# Patient Record
Sex: Male | Born: 1975 | Race: White | Hispanic: No | Marital: Single | Smoking: Never smoker
Health system: Southern US, Community
[De-identification: ages and names within clinical notes are randomized; demographics above are authoritative.]

## PROBLEM LIST (undated history)

## (undated) DIAGNOSIS — G4730 Sleep apnea, unspecified: Secondary | ICD-10-CM

## (undated) DIAGNOSIS — I509 Heart failure, unspecified: Secondary | ICD-10-CM

## (undated) DIAGNOSIS — K631 Perforation of intestine (nontraumatic): Secondary | ICD-10-CM

## (undated) DIAGNOSIS — J90 Pleural effusion, not elsewhere classified: Secondary | ICD-10-CM

## (undated) DIAGNOSIS — Z8619 Personal history of other infectious and parasitic diseases: Secondary | ICD-10-CM

## (undated) DIAGNOSIS — R030 Elevated blood-pressure reading, without diagnosis of hypertension: Secondary | ICD-10-CM

## (undated) DIAGNOSIS — F32A Depression, unspecified: Secondary | ICD-10-CM

## (undated) DIAGNOSIS — F419 Anxiety disorder, unspecified: Secondary | ICD-10-CM

## (undated) DIAGNOSIS — A419 Sepsis, unspecified organism: Secondary | ICD-10-CM

## (undated) DIAGNOSIS — E669 Obesity, unspecified: Secondary | ICD-10-CM

## (undated) HISTORY — DX: Depression, unspecified: F32.A

## (undated) HISTORY — DX: Sleep apnea, unspecified: G47.30

## (undated) HISTORY — DX: Pleural effusion, not elsewhere classified: J90

## (undated) HISTORY — DX: Heart failure, unspecified: I50.9

## (undated) HISTORY — DX: Anxiety disorder, unspecified: F41.9

## (undated) HISTORY — DX: Elevated blood-pressure reading, without diagnosis of hypertension: R03.0

## (undated) HISTORY — DX: Personal history of other infectious and parasitic diseases: Z86.19

## (undated) HISTORY — PX: COLON SURGERY: SHX602

## (undated) HISTORY — DX: Obesity, unspecified: E66.9

## (undated) HISTORY — DX: Sepsis, unspecified organism: A41.9

---
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
## 2004-12-14 ENCOUNTER — Ambulatory Visit: Payer: Self-pay | Admitting: Family Medicine

## 2006-12-16 ENCOUNTER — Encounter: Payer: Self-pay | Admitting: Family Medicine

## 2011-04-06 ENCOUNTER — Encounter: Payer: Self-pay | Admitting: Family Medicine

## 2011-04-06 ENCOUNTER — Ambulatory Visit (INDEPENDENT_AMBULATORY_CARE_PROVIDER_SITE_OTHER): Payer: 59 | Admitting: Family Medicine

## 2011-04-06 DIAGNOSIS — R079 Chest pain, unspecified: Secondary | ICD-10-CM | POA: Insufficient documentation

## 2011-04-06 DIAGNOSIS — R030 Elevated blood-pressure reading, without diagnosis of hypertension: Secondary | ICD-10-CM | POA: Insufficient documentation

## 2011-04-06 DIAGNOSIS — E66813 Obesity, class 3: Secondary | ICD-10-CM | POA: Insufficient documentation

## 2011-04-06 DIAGNOSIS — E669 Obesity, unspecified: Secondary | ICD-10-CM

## 2011-04-06 NOTE — Patient Instructions (Signed)
Return at your convenience fasting for blood work, afterwards for a physical. Consider starting baby aspirin daily. We will recheck your blood pressure next visit (at physical). Work on starting walking at least 3 times daily for 20 -30 min Work on getting more water, more fruits/vegetables, less salt in diet. Good to meet you today, call us with questions. Please seek urgent care if you have any more chest discomfort episodes.

## 2011-04-06 NOTE — Progress Notes (Signed)
Subjective:    Patient ID: Christian [name], male    DOB: 03/25/1975, 36 y.o.   MRN: [id number]  HPI CC: new pt establish  Prior saw Dr. Milinda Antis.  Presents to re establish.  Works at [employer] part time.  Seen at [facility] recently for cold, told had elevated bp reading.  Thinks 150sbp.  No HA, vision changes, SOB, leg swelling.  Sometimes feels chest tightness in cold weather or after exhertion (ie carrying customer's groceries).  When gets discomfort, lasts 1 hour.  Last time had this was last week.  No heartburn sxs.  No arm pain or nausea/diaphoresis.  Not relieved by rest.  No recent labwork or physical.    Father with MI last year.  Father obese.  Preventative: No recent CPE.  Medications and allergies reviewed and updated in chart.  Past histories reviewed and updated if relevant as below. Patient Active Problem List  Diagnoses  . Obesity  . Elevated blood pressure reading without diagnosis of hypertension   Past Medical History  Diagnosis Date  . History of chicken pox   . Allergic rhinitis   . Elevated blood pressure reading without diagnosis of hypertension   . Obesity    Past Surgical History  Procedure Date  . Tonsillectomy and adenoidectomy 1985   History  Substance Use Topics  . Smoking status: Never Smoker   . Smokeless tobacco: Never Used  . Alcohol Use: No   Family History  Problem Relation Age of Onset  . Coronary artery disease Father 12    MI  . Hypertension Father   . Hyperlipidemia Father   . Diabetes Father   . Heart disease Mother     CHF, pacer  . Cancer Mother     lymphoma  . Stroke Paternal Grandmother   . Diabetes Paternal Grandmother    No Known Allergies No current outpatient prescriptions on file prior to visit.   Review of Systems  Constitutional: Negative for fever, chills, activity change, appetite change, fatigue and unexpected weight change.  HENT: Negative for hearing loss and neck pain.   Eyes: Negative for visual  disturbance.  Respiratory: Positive for chest tightness (see HPI). Negative for cough, shortness of breath and wheezing.   Cardiovascular: Negative for chest pain, palpitations and leg swelling.  Gastrointestinal: Negative for nausea, vomiting, abdominal pain, diarrhea, constipation, blood in stool and abdominal distention.  Genitourinary: Negative for hematuria and difficulty urinating.  Musculoskeletal: Negative for myalgias and arthralgias.  Skin: Negative for rash.  Neurological: Negative for dizziness, seizures, syncope and headaches.  Hematological: Does not bruise/bleed easily.  Psychiatric/Behavioral: Negative for dysphoric mood. The patient is not nervous/anxious.        Objective:   Physical Exam  Nursing note and vitals reviewed. Constitutional: He is oriented to person, place, and time. He appears well-developed and well-nourished. No distress.       obese  HENT:  Head: Normocephalic and atraumatic.  Right Ear: Hearing, tympanic membrane, external ear and ear canal normal.  Left Ear: Hearing, tympanic membrane, external ear and ear canal normal.  Nose: Nose normal. No mucosal edema or rhinorrhea.  Mouth/Throat: Uvula is midline, oropharynx is clear and moist and mucous membranes are normal. No oropharyngeal exudate, posterior oropharyngeal edema, posterior oropharyngeal erythema or tonsillar abscesses.  Eyes: Conjunctivae and EOM are normal. Pupils are equal, round, and reactive to light. No scleral icterus.  Neck: Normal [name] of motion. Neck supple. No thyromegaly present.  Cardiovascular: Normal rate, regular rhythm, normal heart sounds  and intact distal pulses.   No murmur heard. Pulses:      Radial pulses are 2+ on the right side, and 2+ on the left side.  Pulmonary/Chest: Effort normal and breath sounds normal. No respiratory distress. He has no wheezes. He has no rales.  Abdominal: Soft. Bowel sounds are normal. He exhibits no distension and no mass. There is no  tenderness. There is no rebound and no guarding.  Musculoskeletal: Normal [name] of motion. He exhibits no edema.  Lymphadenopathy:    He has no cervical adenopathy.  Neurological: He is alert and oriented to person, place, and time.       CN grossly intact, station and gait intact  Skin: Skin is warm and dry. No rash noted.  Psychiatric: He has a normal mood and affect. His behavior is normal. Judgment and thought content normal.      Assessment & Plan:

## 2011-04-06 NOTE — Assessment & Plan Note (Signed)
Encouraged start regular exercise routine, suggested daily or 3x/wk walking 20-30 min around house.  Thinks would be able to do this.  Has discussed with mother joining Y. Briefly discussed healthy eating.

## 2011-04-06 NOTE — Assessment & Plan Note (Signed)
Return fasting for blood work. EKG today. See below. Will recheck at physical in 1 mo, if remaining elevated, will start BP med.

## 2011-04-06 NOTE — Assessment & Plan Note (Addendum)
Atypical chest pain, young male.  No early fmhx CAD although + fm hx in father with recent MI. Risk factors include obesity and mildly elevated BP. rec start baby aspirin, return 1 mo for recheck BP. No current chest pain. Knows if return of chest pain to seek urgent evaluation. baseline EKG today - NSR at 89, 1 PAC, normal axis, intervals, no hypertrophy or acute ST/T changes.  no q waves.

## 2011-04-10 ENCOUNTER — Encounter: Payer: Self-pay | Admitting: Family Medicine

## 2011-05-02 ENCOUNTER — Other Ambulatory Visit: Payer: 59

## 2011-05-05 ENCOUNTER — Encounter: Payer: 59 | Admitting: Family Medicine

## 2016-05-22 ENCOUNTER — Emergency Department: Payer: Managed Care, Other (non HMO)

## 2016-05-22 ENCOUNTER — Emergency Department
Admission: EM | Admit: 2016-05-22 | Discharge: 2016-05-22 | Disposition: A | Discharge status: Home / Self Care | Payer: Managed Care, Other (non HMO) | Attending: Emergency Medicine | Admitting: Emergency Medicine

## 2016-05-22 ENCOUNTER — Encounter: Payer: Self-pay | Admitting: Emergency Medicine

## 2016-05-22 DIAGNOSIS — R0902 Hypoxemia: Secondary | ICD-10-CM | POA: Diagnosis present

## 2016-05-22 DIAGNOSIS — R0602 Shortness of breath: Secondary | ICD-10-CM | POA: Insufficient documentation

## 2016-05-22 DIAGNOSIS — J101 Influenza due to other identified influenza virus with other respiratory manifestations: Secondary | ICD-10-CM

## 2016-05-22 DIAGNOSIS — Z79899 Other long term (current) drug therapy: Secondary | ICD-10-CM | POA: Diagnosis not present

## 2016-05-22 LAB — LACTIC ACID, PLASMA: Lactic Acid, Venous: 0.9 mmol/L (ref 0.5–1.9)

## 2016-05-22 LAB — TROPONIN I

## 2016-05-22 LAB — COMPREHENSIVE METABOLIC PANEL
ALT: 15 U/L — AB (ref 17–63)
AST: 24 U/L (ref 15–41)
Albumin: 3.9 g/dL (ref 3.5–5.0)
Alkaline Phosphatase: 68 U/L (ref 38–126)
Anion gap: 7 (ref 5–15)
BILIRUBIN TOTAL: 0.9 mg/dL (ref 0.3–1.2)
BUN: 17 mg/dL (ref 6–20)
CHLORIDE: 103 mmol/L (ref 101–111)
CO2: 24 mmol/L (ref 22–32)
CREATININE: 1.44 mg/dL — AB (ref 0.61–1.24)
Calcium: 8.7 mg/dL — ABNORMAL LOW (ref 8.9–10.3)
GFR, EST NON AFRICAN AMERICAN: 59 mL/min — AB (ref >60)
Glucose, Bld: 133 mg/dL — ABNORMAL HIGH (ref 65–99)
POTASSIUM: 3.5 mmol/L (ref 3.5–5.1)
SODIUM: 134 mmol/L — AB (ref 135–145)
TOTAL PROTEIN: 7.5 g/dL (ref 6.5–8.1)

## 2016-05-22 LAB — CBC WITH DIFFERENTIAL/PLATELET
BASOS ABS: 0 10*3/uL (ref 0–0.1)
BASOS PCT: 1 %
EOS ABS: 0 10*3/uL (ref 0–0.7)
Eosinophils Relative: 0 %
HCT: 44.7 % (ref 40.0–52.0)
Hemoglobin: 15.2 g/dL (ref 13.0–18.0)
LYMPHS ABS: 0.8 10*3/uL — AB (ref 1.0–3.6)
LYMPHS PCT: 9 %
MCH: 27.3 pg (ref 26.0–34.0)
MCHC: 33.9 g/dL (ref 32.0–36.0)
MCV: 80.6 fL (ref 80.0–100.0)
Monocytes Absolute: 1.7 10*3/uL — ABNORMAL HIGH (ref 0.2–1.0)
Monocytes Relative: 20 %
NEUTROS ABS: 6 10*3/uL (ref 1.4–6.5)
NEUTROS PCT: 70 %
PLATELETS: 271 10*3/uL (ref 150–440)
RBC: 5.55 MIL/uL (ref 4.40–5.90)
RDW: 13.9 % (ref 11.5–14.5)
WBC: 8.6 10*3/uL (ref 3.8–10.6)

## 2016-05-22 LAB — MAGNESIUM: MAGNESIUM: 1.9 mg/dL (ref 1.7–2.4)

## 2016-05-22 LAB — BRAIN NATRIURETIC PEPTIDE: B Natriuretic Peptide: 71 pg/mL (ref 0.0–100.0)

## 2016-05-22 MED ORDER — SODIUM CHLORIDE 0.9 % IV BOLUS (SEPSIS)
1000.0000 mL | INTRAVENOUS | Status: AC
  Administered 2016-05-22: 1000 mL via INTRAVENOUS

## 2016-05-22 MED ORDER — OSELTAMIVIR PHOSPHATE 75 MG PO CAPS
75.0000 mg | ORAL_CAPSULE | Freq: Once | ORAL | Status: AC
  Administered 2016-05-22: 75 mg via ORAL
  Filled 2016-05-22: qty 1

## 2016-05-22 MED ORDER — OSELTAMIVIR PHOSPHATE 75 MG PO CAPS: 75.0000 mg | ORAL_CAPSULE | Freq: Two times a day (BID) | ORAL | 0 refills | Status: AC

## 2016-05-22 NOTE — Discharge Instructions (Signed)
You were diagnosed with the flu (influenza).  You will feel ill for as much as a few weeks.  Please take any prescribed medications as instructed, and you may use over-the-counter Tylenol and/or ibuprofen as needed according to label instructions (unless you have an allergy to either or have been told by your doctor not to take them).  Follow up with your physician as instructed above, and return to the Emergency Department (ED) if you are unable to tolerate fluids due to vomiting, have worsening trouble breathing, become extremely tired or difficult to awaken, or if you develop any other symptoms that concern you.  

## 2016-05-22 NOTE — ED Provider Notes (Signed)
[facility] Emergency Department Provider Note  ____________________________________________   First MD Initiated Contact with Patient 05/22/16 1617     (approximate)  I have reviewed the triage vital signs and the nursing notes.   HISTORY  Chief Complaint hypoxemia (low SPO2)    HPI Christian [name] is a 41 y.o. male 's medical history is significant only for morbid obesity who presents for evaluation of low oxygen saturation in setting of several days of viral symptoms.  [facility] reporting that he has had gradual dry cough, mild headache and some generalized malaise, increased fatigue, and some body aches.  He thinks he has had a fever and a very mild sore throat.  Denies any shortness of breath, chest pain, nausea, vomiting, abdominal pain, orthopnea, congestion, sneezing, ear pain.  Nothing particular makes his symptoms better nor worse.  He reports that at least one of his coworkers has had similar symptoms recently.  At [facility] his evaluation was generally reassuring except for questionable decreased breath sounds throughout, but he had a very mild tachycardia and an SPO2 of 88% on room air so EMS was called.  This reports that his oxygenation was 95% on room air which is consistent with what we found in the emergency department.   Past Medical History:  Diagnosis Date  . Allergic rhinitis   . Elevated blood pressure reading without diagnosis of hypertension   . History of chicken pox   . Obesity     Patient Active Problem List   Diagnosis Date Noted  . Chest pain 04/06/2011  . Obesity   . Elevated blood pressure reading without diagnosis of hypertension     Past Surgical History:  Procedure Laterality Date  . TONSILLECTOMY AND ADENOIDECTOMY  1985    Prior to Admission medications   Medication Sig Start Date End Date Taking? Authorizing Provider  albuterol (PROVENTIL HFA;VENTOLIN HFA) 108 (90 Base) MCG/ACT  inhaler Inhale 1-2 puffs into the lungs every 4 (four) hours as needed. 01/26/16 01/25/17 Yes Historical Provider, MD  cetirizine (ZYRTEC) 10 MG tablet Take 10 mg by mouth daily.   Yes Historical Provider, MD  Cholecalciferol ([employer] D3) 5000 units capsule Take 20,000 Units by mouth daily.   Yes Historical Provider, MD  Menaquinone-7 (VITAMIN K2) 100 MCG CAPS Take 200 capsules by mouth daily.   Yes Historical Provider, MD  Multiple Vitamin (MULTIVITAMIN) capsule Take 1 capsule by mouth daily.   Yes Historical Provider, MD  oseltamivir (TAMIFLU) 75 MG capsule Take 1 capsule (75 mg total) by mouth 2 (two) times daily. 05/22/16 05/27/16  Loleta Rose, MD    Allergies Patient has no known allergies.  Family History  Problem Relation Age of Onset  . Coronary artery disease Father 87    MI  . Hypertension Father   . Hyperlipidemia Father   . Diabetes Father   . Heart disease Mother     CHF, pacer  . Cancer Mother     lymphoma  . Stroke Paternal Grandmother   . Diabetes Paternal Grandmother     Social History Social History  Substance Use Topics  . Smoking status: Never Smoker  . Smokeless tobacco: Never Used  . Alcohol use No    Review of Systems Constitutional: Subjective fever, no chills.  Increased fatigue and +myalgia Eyes: No visual changes. ENT: Mild sore throat. Cardiovascular: Denies chest pain. Respiratory: Dry cough, no shortness of breath, no increased work of breathing Gastrointestinal: No abdominal pain.  No nausea, no vomiting.  No diarrhea.  No constipation. Genitourinary: Negative for dysuria. Musculoskeletal: Negative for back pain. Skin: Negative for rash. Neurological: Negative for headaches, focal weakness or numbness.  10-point ROS otherwise negative.  ____________________________________________   PHYSICAL EXAM:  VITAL SIGNS: ED Triage Vitals [05/22/16 1605]  Enc Vitals Group     BP 103/66     Pulse Rate (!) 110     Resp      Temp 99.3 F (37.4  C)     Temp Source Oral     SpO2 95 %     Weight (!) 400 lb (181.4 kg)     Height 6\' 3"  (1.905 m)     Head Circumference      Peak Flow      Pain Score      Pain Loc      Pain Edu?      Excl. in GC?     Constitutional: Alert and oriented. No acute distress Eyes: Conjunctivae are normal. PERRL. EOMI. Head: Atraumatic. Nose: No congestion/rhinnorhea. Mouth/Throat: Mucous membranes are active.  The Neck: No stridor.  No meningeal signs.   Cardiovascular: Mild tachycardia, regular rhythm. Good peripheral circulation. Grossly normal heart sounds. Respiratory: Normal respiratory effort.  No retractions. Lungs CTAB.  Lung sounds may be decreased from baseline but I think they are likely decreased due to body habitus. Gastrointestinal: Morbid obesity.  Soft and nontender. No distention.  Musculoskeletal: No lower extremity tenderness nor edema. No gross deformities of extremities. Neurologic:  Normal speech and language. No gross focal neurologic deficits are appreciated.  Skin:  Skin is warm, dry and intact. No rash noted. Psychiatric: Mood and affect are normal. Speech and behavior are normal.  ____________________________________________   LABS (all labs ordered are listed, but only abnormal results are displayed)  Labs Reviewed  CBC WITH DIFFERENTIAL/PLATELET - Abnormal; Notable for the following:       Result Value   Lymphs Abs 0.8 (*)    Monocytes Absolute 1.7 (*)    All other components within normal limits  COMPREHENSIVE METABOLIC PANEL - Abnormal; Notable for the following:    Sodium 134 (*)    Glucose, Bld 133 (*)    Creatinine, Ser 1.44 (*)    Calcium 8.7 (*)    ALT 15 (*)    GFR calc non Af Amer 59 (*)    All other components within normal limits  TROPONIN I  MAGNESIUM  BRAIN NATRIURETIC PEPTIDE  LACTIC ACID, PLASMA   ____________________________________________  EKG  ED ECG REPORT I, Harman Langhans, the attending physician, personally viewed and  interpreted this ECG.  Date: 05/22/2016 EKG Time: 16:07 Rate: 110 Rhythm: Sinus tachycardia QRS Axis: normal Intervals: normal ST/T Wave abnormalities: normal Conduction Disturbances: none Narrative Interpretation: unremarkable  ____________________________________________  RADIOLOGY   Dg Chest 2 View  Result Date: 05/22/2016 CLINICAL DATA:  Shortness of breath, cough, fever. EXAM: CHEST  2 VIEW COMPARISON:  None. FINDINGS: The heart size and mediastinal contours are within normal limits. Both lungs are clear. No pneumothorax or pleural effusion is noted. The visualized skeletal structures are unremarkable. IMPRESSION: No active cardiopulmonary disease. Electronically Signed   By: Lupita RaiderJames  Green Jr, M.D.   On: 05/22/2016 17:14    ____________________________________________   PROCEDURES  Critical Care performed: No   Procedure(s) performed:   Procedures   ____________________________________________   INITIAL IMPRESSION / ASSESSMENT AND PLAN / ED COURSE  Pertinent labs & imaging results that were available during my care of  the patient were reviewed by me and considered in my medical decision making (see chart for details).  I obtained written authorization from the patient according to the form provided through care everywhere to obtain electronic access to [facility].  I reviewed the clinic note including his reassuring exam and the fact that the point of care test for influenza was positive for influenza A and negative for influenza B.  All of his vital signs were normal except for a mild tachycardia and a recorded room air oxygen saturation of 88%.  This is in contrast to the consistent SPO2 readings that EMS and our department has measured of 95% on room air.  He is in no distress and not using accessory muscles.  Given the reports of hypoxemia which is likely compounded by his body habitus, that he is influenza A positive, I will evaluate him further  for possible pneumonia to determine an appropriate disposition.   Clinical Course as of May 23 1923  Wynelle Link May 22, 2016  1924 The patient has had no episodes of hypoxemia since coming to the emergency department.  After fluid resuscitation and his heart rate is coming down.  He is not short of breath and agrees with the plan for outpatient follow-up.  There is no benefit to admission for him as there is nothing they can do in the hospital to cure him of his influenza, and he is not oxygen dependent.  I had my usual discussion regarding the questionable efficacy of Tamiflu but given that he has symptoms for 2 days and it may help him with the duration of symptoms in spite of the side effects of diarrhea, etc., I have given him a prescription.  I encouraged him to follow up as soon as possible and I gave strict return precautions if his breathing status worsens.  He understands and agrees with the plan.  [CF]    Clinical Course User Index [CF] Loleta Rose, MD    ____________________________________________  FINAL CLINICAL IMPRESSION(S) / ED DIAGNOSES  Final diagnoses:  Influenza A     MEDICATIONS GIVEN DURING THIS VISIT:  Medications  sodium chloride 0.9 % bolus 1,000 mL (0 mLs Intravenous Stopped 05/22/16 1842)  sodium chloride 0.9 % bolus 1,000 mL (1,000 mLs Intravenous New Bag/Given 05/22/16 1841)  oseltamivir (TAMIFLU) capsule 75 mg (75 mg Oral Given 05/22/16 1906)     NEW OUTPATIENT MEDICATIONS STARTED DURING THIS VISIT:  New Prescriptions   OSELTAMIVIR (TAMIFLU) 75 MG CAPSULE    Take 1 capsule (75 mg total) by mouth 2 (two) times daily.    Modified Medications   No medications on file    Discontinued Medications   No medications on file     Note:  This document was prepared using Dragon voice recognition software and may include unintentional dictation errors.    Loleta Rose, MD 05/22/16 [phone]

## 2016-05-22 NOTE — ED Triage Notes (Addendum)
[facility] c/o low SPO2 there. Pt 95% room air on arrival without SOB, or dyspnea. Pt tested positive for the flu at [facility]

## 2019-09-23 ENCOUNTER — Other Ambulatory Visit: Payer: Self-pay

## 2019-09-23 ENCOUNTER — Emergency Department: Payer: 59

## 2019-09-23 ENCOUNTER — Encounter: Payer: Self-pay | Admitting: Emergency Medicine

## 2019-09-23 ENCOUNTER — Emergency Department
Admission: EM | Admit: 2019-09-23 | Discharge: 2019-09-24 | Disposition: A | Discharge status: Home / Self Care | Payer: 59 | Attending: Emergency Medicine | Admitting: Emergency Medicine

## 2019-09-23 DIAGNOSIS — Z7951 Long term (current) use of inhaled steroids: Secondary | ICD-10-CM | POA: Insufficient documentation

## 2019-09-23 DIAGNOSIS — J40 Bronchitis, not specified as acute or chronic: Secondary | ICD-10-CM | POA: Insufficient documentation

## 2019-09-23 DIAGNOSIS — R05 Cough: Secondary | ICD-10-CM | POA: Insufficient documentation

## 2019-09-23 DIAGNOSIS — Z79899 Other long term (current) drug therapy: Secondary | ICD-10-CM | POA: Insufficient documentation

## 2019-09-23 DIAGNOSIS — I10 Essential (primary) hypertension: Secondary | ICD-10-CM | POA: Insufficient documentation

## 2019-09-23 DIAGNOSIS — R6883 Chills (without fever): Secondary | ICD-10-CM | POA: Insufficient documentation

## 2019-09-23 DIAGNOSIS — Z20822 Contact with and (suspected) exposure to covid-19: Secondary | ICD-10-CM | POA: Insufficient documentation

## 2019-09-23 DIAGNOSIS — M791 Myalgia, unspecified site: Secondary | ICD-10-CM | POA: Insufficient documentation

## 2019-09-23 LAB — COMPREHENSIVE METABOLIC PANEL
ALT: 16 U/L (ref 0–44)
AST: 16 U/L (ref 15–41)
Albumin: 4.1 g/dL (ref 3.5–5.0)
Alkaline Phosphatase: 71 U/L (ref 38–126)
Anion gap: 11 (ref 5–15)
BUN: 15 mg/dL (ref 6–20)
CO2: 24 mmol/L (ref 22–32)
Calcium: 8.9 mg/dL (ref 8.9–10.3)
Chloride: 102 mmol/L (ref 98–111)
Creatinine, Ser: 1.26 mg/dL — ABNORMAL HIGH (ref 0.61–1.24)
GFR calc Af Amer: >60 mL/min (ref >60)
GFR calc non Af Amer: >60 mL/min (ref >60)
Glucose, Bld: 127 mg/dL — ABNORMAL HIGH (ref 70–99)
Potassium: 3.8 mmol/L (ref 3.5–5.1)
Sodium: 137 mmol/L (ref 135–145)
Total Bilirubin: 1.5 mg/dL — ABNORMAL HIGH (ref 0.3–1.2)
Total Protein: 7.8 g/dL (ref 6.5–8.1)

## 2019-09-23 LAB — CBC
HCT: 45.6 % (ref 39.0–52.0)
Hemoglobin: 14.8 g/dL (ref 13.0–17.0)
MCH: 27 pg (ref 26.0–34.0)
MCHC: 32.5 g/dL (ref 30.0–36.0)
MCV: 83.1 fL (ref 80.0–100.0)
Platelets: 309 10*3/uL (ref 150–400)
RBC: 5.49 MIL/uL (ref 4.22–5.81)
RDW: 13.2 % (ref 11.5–15.5)
WBC: 10.2 10*3/uL (ref 4.0–10.5)
nRBC: 0 % (ref 0.0–0.2)

## 2019-09-23 MED ORDER — IPRATROPIUM-ALBUTEROL 0.5-2.5 (3) MG/3ML IN SOLN
3.0000 mL | Freq: Once | RESPIRATORY_TRACT | Status: AC
  Administered 2019-09-24: 3 mL via RESPIRATORY_TRACT
  Filled 2019-09-23: qty 3

## 2019-09-23 MED ORDER — PREDNISONE 20 MG PO TABS
60.0000 mg | ORAL_TABLET | Freq: Once | ORAL | Status: AC
  Administered 2019-09-24: 60 mg via ORAL
  Filled 2019-09-23: qty 3

## 2019-09-23 NOTE — ED Provider Notes (Signed)
[facility] Emergency Department Provider Note   ____________________________________________   First MD Initiated Contact with Patient 09/23/19 2354     (approximate)  I have reviewed the triage vital signs and the nursing notes.   HISTORY  Chief Complaint Shortness of Breath, Cough, Chills, and Generalized Body Aches    HPI Christian [name] is a 44 y.o. male referred from the urgent care to the ED for evaluation of hypertension and hypoxia.  Patient went to the urgent care for work note because he has been off of work the last 2 days due to cold-like symptoms.  Patient complaining of chills, productive cough, shortness of breath and body aches.  Denies known COVID-19 exposure.  Has not had his COVID-19 vaccinations.  Denies chest pain, abdominal pain, nausea, vomiting or diarrhea.       Past Medical History:  Diagnosis Date  . Allergic rhinitis   . Elevated blood pressure reading without diagnosis of hypertension   . History of chicken pox   . Obesity     Patient Active Problem List   Diagnosis Date Noted  . Chest pain 04/06/2011  . Obesity   . Elevated blood pressure reading without diagnosis of hypertension     Past Surgical History:  Procedure Laterality Date  . TONSILLECTOMY AND ADENOIDECTOMY  1985    Prior to Admission medications   Medication Sig Start Date End Date Taking? Authorizing Provider  albuterol (VENTOLIN HFA) 108 (90 Base) MCG/ACT inhaler Inhale 2 puffs into the lungs every 4 (four) hours as needed for wheezing or shortness of breath. 09/24/19   Irean Hong, MD  cetirizine (ZYRTEC) 10 MG tablet Take 10 mg by mouth daily.    [provider]  chlorpheniramine-HYDROcodone (TUSSIONEX PENNKINETIC ER) 10-8 MG/5ML SUER Take 5 mLs by mouth 2 (two) times daily. 09/24/19   Irean Hong, MD  Cholecalciferol ([employer] D3) 5000 units capsule Take 20,000 Units by mouth daily.    [provider]  Menaquinone-7 (VITAMIN K2)  100 MCG CAPS Take 200 capsules by mouth daily.    [provider]  Multiple Vitamin (MULTIVITAMIN) capsule Take 1 capsule by mouth daily.    [provider]  predniSONE (DELTASONE) 20 MG tablet 3 tablets daily x 4 days 09/24/19   Irean Hong, MD    Allergies Patient has no known allergies.  Family History  Problem Relation Age of Onset  . Coronary artery disease Father 49       MI  . Hypertension Father   . Hyperlipidemia Father   . Diabetes Father   . Heart disease Mother        CHF, pacer  . Cancer Mother        lymphoma  . Stroke Paternal Grandmother   . Diabetes Paternal Grandmother     Social History Social History   Tobacco Use  . Smoking status: Never Smoker  . Smokeless tobacco: Never Used  Substance Use Topics  . Alcohol use: No  . Drug use: No    Review of Systems  Constitutional: Positive for chills Eyes: No visual changes. ENT: No sore throat. Cardiovascular: Denies chest pain. Respiratory: Positive for cough and shortness of breath. Gastrointestinal: No abdominal pain.  No nausea, no vomiting.  No diarrhea.  No constipation. Genitourinary: Negative for dysuria. Musculoskeletal: Negative for back pain. Skin: Negative for rash. Neurological: Negative for headaches, focal weakness or numbness.   ____________________________________________   PHYSICAL EXAM:  VITAL SIGNS: ED Triage Vitals  Enc Vitals Group     BP 09/23/19 1656 (!) 130/68     Pulse Rate 09/23/19 1656 96     Resp 09/23/19 1656 19     Temp 09/23/19 1656 98.1 F (36.7 C)     Temp Source 09/23/19 1656 Oral     SpO2 09/23/19 1656 92 %     Weight 09/23/19 1657 (!) 420 lb (190.5 kg)     Height 09/23/19 1657 6\' 3"  (1.905 m)     Head Circumference --      Peak Flow --      Pain Score 09/23/19 1657 0     Pain Loc --      Pain Edu? --      Excl. in GC? --     Constitutional: Alert and oriented. Well appearing and in mild acute distress. Eyes: Conjunctivae are  normal. PERRL. EOMI. Head: Atraumatic. Nose: No congestion/rhinnorhea. Mouth/Throat: Mucous membranes are moist.  Oropharynx non-erythematous. Neck: No stridor.   Cardiovascular: Normal rate, regular rhythm. Grossly normal heart sounds.  Good peripheral circulation. Respiratory: Normal respiratory effort.  No retractions. Lungs with scattered rhonchi and wheezing. Gastrointestinal: Soft and nontender. No distention. No abdominal bruits. No CVA tenderness. Musculoskeletal: No lower extremity tenderness nor edema.  No joint effusions. Neurologic:  Normal speech and language. No gross focal neurologic deficits are appreciated. No gait instability. Skin:  Skin is warm, dry and intact. No rash noted. Psychiatric: Mood and affect are normal. Speech and behavior are normal.  ____________________________________________   LABS (all labs ordered are listed, but only abnormal results are displayed)  Labs Reviewed  COMPREHENSIVE METABOLIC PANEL - Abnormal; Notable for the following components:      Result Value   Glucose, Bld 127 (*)    Creatinine, Ser 1.26 (*)    Total Bilirubin 1.5 (*)    All other components within normal limits  SARS CORONAVIRUS 2 BY RT PCR (HOSPITAL ORDER, PERFORMED IN [facility])  CBC  TROPONIN I (HIGH SENSITIVITY)   ____________________________________________  EKG  ED ECG REPORT I, Zigmund Linse J, the attending physician, personally viewed and interpreted this ECG.   Date: 09/24/2019  EKG Time: 1656  Rate: 98  Rhythm: normal EKG, normal sinus rhythm  Axis: Normal  Intervals:none  ST&T Change: Nonspecific  ____________________________________________  RADIOLOGY  ED MD interpretation: Bronchitis or RAD; no focal pneumonia  Official radiology report(s): DG Chest 2 View  Result Date: 09/23/2019 CLINICAL DATA:  Shortness of breath EXAM: CHEST - 2 VIEW COMPARISON:  05/22/2016 FINDINGS: No consolidation or pleural effusion. Suspicion of central  airways thickening. Normal heart size. No pneumothorax. IMPRESSION: Suspected central airways thickening as may be seen with possible bronchitis or reactive airways. No focal pneumonia is seen. Electronically Signed   By: 05/24/2016 M.D.   On: 09/23/2019 18:09    ____________________________________________   PROCEDURES  Procedure(s) performed (including Critical Care):  Procedures   ____________________________________________   INITIAL IMPRESSION / ASSESSMENT AND PLAN / ED COURSE  As part of my medical decision making, I reviewed the following data within the electronic MEDICAL RECORD NUMBER Nursing notes reviewed and incorporated, Labs reviewed, EKG interpreted, Old chart reviewed, Radiograph reviewed and Notes from prior ED visits     Christian [name] was evaluated in Emergency Department on 09/24/2019 for the symptoms described in the history of present illness. He was evaluated in the context of the global COVID-19 pandemic, which necessitated consideration that the patient might be at risk for infection with  the SARS-CoV-2 virus that causes COVID-19. Institutional protocols and algorithms that pertain to the evaluation of patients at risk for COVID-19 are in a state of rapid change based on information released by regulatory bodies including the CDC and federal and state organizations. These policies and algorithms were followed during the patient's care in the ED.    44 year old male presenting with cough, body aches, shortness of breath. Differential includes, but is not limited to, viral syndrome, bronchitis including COPD exacerbation, pneumonia, reactive airway disease including asthma, CHF including exacerbation with or without pulmonary/interstitial edema, pneumothorax, ACS, thoracic trauma, and pulmonary embolism.  Laboratory results unremarkable.  Creatinine improved from prior.  Will check troponin, Covid swab.  Administer Prednisone, DuoNeb and reassess.   Clinical Course  as of Sep 24 615  Tue Sep 24, 2019  0250 Patient resting in no acute distress.  Room air saturations 94%.  Updated him on negative Covid results.  Will discharge home on prednisone burst, Tussionex to use as needed and albuterol inhaler.  Strict return precautions given.  Patient verbalizes understanding and agrees with plan of care.   [JS]    Clinical Course User Index [JS] Irean Hong, MD     ____________________________________________   FINAL CLINICAL IMPRESSION(S) / ED DIAGNOSES  Final diagnoses:  Bronchitis     ED Discharge Orders         Ordered    albuterol (VENTOLIN HFA) 108 (90 Base) MCG/ACT inhaler  Every 4 hours PRN     Discontinue  Reprint     09/24/19 0252    predniSONE (DELTASONE) 20 MG tablet     Discontinue  Reprint     09/24/19 0252    chlorpheniramine-HYDROcodone (TUSSIONEX PENNKINETIC ER) 10-8 MG/5ML SUER  2 times daily     Discontinue  Reprint     09/24/19 0252           Note:  This document was prepared using Dragon voice recognition software and may include unintentional dictation errors.   Irean Hong, MD 09/24/19 [phone]

## 2019-09-23 NOTE — ED Triage Notes (Signed)
[facility] work [facility] because he has missed the last 2 days at work due to cold like sx's. Pt reports sx's are chills, cough, SOB and general bodyaches. Pt reports that they advised him to come to the ED for HTN and low oxygen sats

## 2019-09-23 NOTE — ED Notes (Signed)
Rainbow was sent. Placed pt on 2 liters per RN Tiffany.

## 2019-09-24 LAB — SARS CORONAVIRUS 2 BY RT PCR (HOSPITAL ORDER, PERFORMED IN ~~LOC~~ HOSPITAL LAB): SARS Coronavirus 2: NEGATIVE

## 2019-09-24 LAB — TROPONIN I (HIGH SENSITIVITY): Troponin I (High Sensitivity): 13 ng/L (ref <18)

## 2019-09-24 MED ORDER — ALBUTEROL SULFATE HFA 108 (90 BASE) MCG/ACT IN AERS: 2.0000 | INHALATION_SPRAY | RESPIRATORY_TRACT | 0 refills | Status: DC | PRN

## 2019-09-24 MED ORDER — PREDNISONE 20 MG PO TABS: ORAL_TABLET | ORAL | 0 refills | Status: DC

## 2019-09-24 MED ORDER — HYDROCOD POLST-CPM POLST ER 10-8 MG/5ML PO SUER: 5.0000 mL | Freq: Two times a day (BID) | ORAL | 0 refills | Status: DC

## 2019-09-24 NOTE — ED Notes (Signed)
Pt unable to sign E-signature due to signature pad malfunction. Pt verbalized understanding of d/c instructions and had no additional questions or concerns for this RN or provider. Pt left with d/c instructions and gathered all personal belongings from room and removed them prior to ED departure.   

## 2019-09-24 NOTE — Discharge Instructions (Signed)
1. Take cough medicine as needed (Tussionex). 2. Use Albuterol inhaler 2 puffs every 4 hours as needed for wheezing. 3. Take steroid as prescribed (Prednisone 60mg  daily x 4 days). 4. Return to the ER for worsening symptoms, persistent vomiting, difficulty breathing or other concerns.

## 2020-09-16 ENCOUNTER — Emergency Department
Admission: EM | Admit: 2020-09-16 | Discharge: 2020-09-16 | Disposition: A | Discharge status: Home / Self Care | Payer: Self-pay | Attending: Emergency Medicine | Admitting: Emergency Medicine

## 2020-09-16 ENCOUNTER — Other Ambulatory Visit: Payer: Self-pay

## 2020-09-16 ENCOUNTER — Emergency Department: Payer: Self-pay

## 2020-09-16 DIAGNOSIS — Z79899 Other long term (current) drug therapy: Secondary | ICD-10-CM | POA: Insufficient documentation

## 2020-09-16 DIAGNOSIS — R600 Localized edema: Secondary | ICD-10-CM | POA: Insufficient documentation

## 2020-09-16 DIAGNOSIS — M722 Plantar fascial fibromatosis: Secondary | ICD-10-CM | POA: Insufficient documentation

## 2020-09-16 DIAGNOSIS — I10 Essential (primary) hypertension: Secondary | ICD-10-CM | POA: Insufficient documentation

## 2020-09-16 DIAGNOSIS — M7732 Calcaneal spur, left foot: Secondary | ICD-10-CM | POA: Insufficient documentation

## 2020-09-16 LAB — CBC WITH DIFFERENTIAL/PLATELET
Abs Immature Granulocytes: 0.05 10*3/uL (ref 0.00–0.07)
Basophils Absolute: 0.1 10*3/uL (ref 0.0–0.1)
Basophils Relative: 1 %
Eosinophils Absolute: 0.3 10*3/uL (ref 0.0–0.5)
Eosinophils Relative: 3 %
HCT: 43.6 % (ref 39.0–52.0)
Hemoglobin: 13.9 g/dL (ref 13.0–17.0)
Immature Granulocytes: 0 %
Lymphocytes Relative: 9 %
Lymphs Abs: 1.2 10*3/uL (ref 0.7–4.0)
MCH: 27.7 pg (ref 26.0–34.0)
MCHC: 31.9 g/dL (ref 30.0–36.0)
MCV: 86.9 fL (ref 80.0–100.0)
Monocytes Absolute: 0.9 10*3/uL (ref 0.1–1.0)
Monocytes Relative: 7 %
Neutro Abs: 10.4 10*3/uL — ABNORMAL HIGH (ref 1.7–7.7)
Neutrophils Relative %: 80 %
Platelets: 305 10*3/uL (ref 150–400)
RBC: 5.02 MIL/uL (ref 4.22–5.81)
RDW: 13.5 % (ref 11.5–15.5)
WBC: 12.9 10*3/uL — ABNORMAL HIGH (ref 4.0–10.5)
nRBC: 0 % (ref 0.0–0.2)

## 2020-09-16 LAB — COMPREHENSIVE METABOLIC PANEL
ALT: 13 U/L (ref 0–44)
AST: 15 U/L (ref 15–41)
Albumin: 3.5 g/dL (ref 3.5–5.0)
Alkaline Phosphatase: 71 U/L (ref 38–126)
Anion gap: 10 (ref 5–15)
BUN: 13 mg/dL (ref 6–20)
CO2: 28 mmol/L (ref 22–32)
Calcium: 8.5 mg/dL — ABNORMAL LOW (ref 8.9–10.3)
Chloride: 103 mmol/L (ref 98–111)
Creatinine, Ser: 1.19 mg/dL (ref 0.61–1.24)
GFR, Estimated: >60 mL/min (ref >60)
Glucose, Bld: 122 mg/dL — ABNORMAL HIGH (ref 70–99)
Potassium: 3.8 mmol/L (ref 3.5–5.1)
Sodium: 141 mmol/L (ref 135–145)
Total Bilirubin: 1.1 mg/dL (ref 0.3–1.2)
Total Protein: 7.3 g/dL (ref 6.5–8.1)

## 2020-09-16 LAB — BRAIN NATRIURETIC PEPTIDE: B Natriuretic Peptide: 15.5 pg/mL (ref 0.0–100.0)

## 2020-09-16 LAB — LACTIC ACID, PLASMA: Lactic Acid, Venous: 1.1 mmol/L (ref 0.5–1.9)

## 2020-09-16 MED ORDER — AMLODIPINE BESYLATE 5 MG PO TABS: 5.0000 mg | ORAL_TABLET | Freq: Once | ORAL | Status: DC

## 2020-09-16 MED ORDER — AMLODIPINE BESYLATE 5 MG PO TABS
5.0000 mg | ORAL_TABLET | Freq: Every day | ORAL | 2 refills | Status: DC
  Filled 2020-09-16: qty 30, 30d supply, fill #0

## 2020-09-16 NOTE — ED Triage Notes (Signed)
First Nurse Note:  Pulled from the car.  Patient able to stand and transfer to wheelchair independently.  Incontinent stool.  States he has been homeless for 2 weeks.  AAOx3.  Skin warm and dry. NAD

## 2020-09-16 NOTE — Discharge Instructions (Signed)
Your labs overall benign return at this time.  X-ray did reveal a heel spur on the left, as well as evidence of some plantar fasciitis.  Placed in a cam boot to help with foot pain with walking.  You should rest with the foot elevated when not walking.  Apply the boot only when actively walking.  You should follow-up with one of the local community clinics for routine medical care.  You also be started on a low-dose blood pressure medicine to help reduce your blood pressure readings.  Return to the ED if needed.

## 2020-09-16 NOTE — ED Triage Notes (Signed)
See first nurse note. Pt remains in clothes in which he soiled himself. Pt taken to decon shower to shower and given new gown.

## 2020-09-16 NOTE — ED Provider Notes (Signed)
[facility] Emergency Department Provider Note  ____________________________________________   Event Date/Time   First MD Initiated Contact with Patient 09/16/20 1952     (approximate)  I have reviewed the triage vital signs and the nursing notes.   HISTORY  Chief Complaint Homeless and Blister  HPI Christian [name] is a 45 y.o. male recently finds himself homeless, after he was unable to provide a rent for the last 2 months, at around he was renting.  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  He presents today, noting significant pain to the left foot at the heel, for the last several days.  Patient reports sleeping in his car, and had an incident today, where he attempted to get out of the car with bare feet, and had had significant pain to the foot and heel but he denies a fall, injury, or trauma.  Patient been unable to don his shoes, secondary to the pain, as well as some swelling in his feet.  He does note that his large body frame, makes it difficult to maneuver while in the car.  He denies any other complaints at this time.  He does note that he has been told in the past his blood pressure was elevated, and is aware of swelling to the legs, but denies any shortness of breath, weakness, or paresthesias.  Patient also denies any chest pain, shortness of breath, or syncope.  Past Medical History:  Diagnosis Date   Allergic rhinitis    Elevated blood pressure reading without diagnosis of hypertension    History of chicken pox    Obesity     Patient Active Problem List   Diagnosis Date Noted   Chest pain 04/06/2011   Obesity    Elevated blood pressure reading without diagnosis of hypertension     Past Surgical History:  Procedure Laterality Date   TONSILLECTOMY AND ADENOIDECTOMY  1985    Prior to Admission medications   Medication Sig Start Date End  Date Taking? Authorizing Provider  amLODipine (NORVASC) 5 MG tablet Take 1 tablet (5 mg total) by mouth daily. 09/16/20 12/15/20 Yes Noal Abshier, Charlesetta Ivory, PA-C    Allergies Patient has no known allergies.  Family History  Problem Relation Age of Onset   Coronary artery disease Father 41       MI   Hypertension Father    Hyperlipidemia Father    Diabetes Father    Heart disease Mother        CHF, pacer   Cancer Mother        lymphoma   Stroke Paternal Grandmother    Diabetes Paternal Grandmother     Social History Social History   Tobacco Use   Smoking status: Never   Smokeless tobacco: Never  Substance Use Topics   Alcohol use: No   Drug use: No    Review of Systems  Constitutional: No fever/chills Eyes: No visual changes. ENT: No sore throat. Cardiovascular: Denies chest pain. Respiratory: Denies shortness of breath. Gastrointestinal: No abdominal pain.  No nausea, no vomiting.  No diarrhea.  No constipation. Genitourinary: Negative for dysuria. Musculoskeletal: Negative for back pain.  Left foot pain as above. Skin: Negative for rash. Neurological: Negative for headaches, focal weakness or numbness. ____________________________________________   PHYSICAL EXAM:  VITAL SIGNS: ED Triage Vitals [09/16/20 1825]  Enc Vitals Group     BP (!) 136/103  Pulse Rate 97     Resp 18     Temp 98.5 F (36.9 C)     Temp Source Oral     SpO2 94 %     Weight (!) 430 lb (195 kg)     Height 6' (1.829 m)     Head Circumference      Peak Flow      Pain Score 7     Pain Loc      Pain Edu?      Excl. in GC?     Constitutional: Alert and oriented. Well appearing and in no acute distress. Eyes: Conjunctivae are normal. PERRL. EOMI. Head: Atraumatic. Nose: No congestion/rhinnorhea. Mouth/Throat: Mucous membranes are moist.  Oropharynx non-erythematous. Neck: No stridor.   Cardiovascular: Normal rate, regular rhythm. Grossly normal heart sounds.  Good peripheral  circulation. Respiratory: Normal respiratory effort.  No retractions. Lungs CTAB. Gastrointestinal: Soft and nontender. No distention. No abdominal bruits. No CVA tenderness. Musculoskeletal: Bilateral lower extremity edema.  No joint effusions.  Nontender to palpation to the left calcaneal heel. Neurologic:  Normal speech and language. No gross focal neurologic deficits are appreciated. No gait instability. Skin:  Skin is warm, dry and intact. No rash noted. Psychiatric: Mood and affect are normal. Speech and behavior are normal.  ____________________________________________   LABS (all labs ordered are listed, but only abnormal results are displayed)  Labs Reviewed  COMPREHENSIVE METABOLIC PANEL - Abnormal; Notable for the following components:      Result Value   Glucose, Bld 122 (*)    Calcium 8.5 (*)    All other components within normal limits  CBC WITH DIFFERENTIAL/PLATELET - Abnormal; Notable for the following components:   WBC 12.9 (*)    Neutro Abs 10.4 (*)    All other components within normal limits  LACTIC ACID, PLASMA  BRAIN NATRIURETIC PEPTIDE  LACTIC ACID, PLASMA   ____________________________________________  EKG  ____________________________________________  RADIOLOGY I, Lissa Hoard, personally viewed and evaluated these images (plain radiographs) as part of my medical decision making, as well as reviewing the written report by the radiologist.  ED MD interpretation:  agree with report  Official radiology report(s): DG Foot Complete Left  Result Date: 09/16/2020 CLINICAL DATA:  Left heel pain. EXAM: LEFT FOOT - COMPLETE 3+ VIEW COMPARISON:  None. FINDINGS: There is no evidence of fracture or dislocation. There is moderate degenerative change of the midfoot, with fragmented osteophyte in the dorsal aspect. Mild degenerative change of the first metatarsal phalangeal joint. There are hammertoe deformity of the digits. Small Achilles tendon  enthesophyte. There is a moderate plantar calcaneal spur. Sheet like calcifications in the region of the plantar fascia. Generalized soft tissue prominence likely related habitus. IMPRESSION: 1. Moderate plantar calcaneal spur with dystrophic calcifications in the region of the plantar fascia, can be seen with plantar fasciitis. 2. Moderate midfoot osteoarthritis. 3. Small Achilles tendon enthesophyte. Electronically Signed   By: Narda Rutherford M.D.   On: 09/16/2020 21:30    ____________________________________________   PROCEDURES  Procedure(s) performed (including Critical Care):  Procedures  CAM boot  ____________________________________________   INITIAL IMPRESSION / ASSESSMENT AND PLAN / ED COURSE  As part of my medical decision making, I reviewed the following data within the electronic MEDICAL RECORD NUMBER Labs reviewed as above, Radiograph reviewed as noted, and Notes from prior ED visits    Patient with ED evaluation of pain to the left foot, on presentation to the ED.  Patient was found to have  significant calcaneal and Achilles heel spurs on the left.  Symptoms are consistent with a probable plantar fasciitis.  Patient is placed in a cam boot for ambulation.  He is also treated for elevated blood pressure at this time with a prescription for amlodipine.  No lab evidence of any acute renal injury, electrolyte abnormality, or infection.  Patient is given resources for local community clinics, and precautions are sent to the [place] medication assistance program.  Return precautions have been reviewed.    ___________________________________________   FINAL CLINICAL IMPRESSION(S) / ED DIAGNOSES  Final diagnoses:  Plantar fasciitis  Heel spur, left     ED Discharge Orders          Ordered    amLODipine (NORVASC) 5 MG tablet  Daily        09/16/20 2251             Note:  This document was prepared using Dragon voice recognition software and may include unintentional  dictation errors.    Lissa Hoard, PA-C 09/17/20 0030    Gilles Chiquito, MD 09/17/20 1105

## 2020-09-17 ENCOUNTER — Other Ambulatory Visit: Payer: Self-pay

## 2020-09-18 ENCOUNTER — Other Ambulatory Visit: Payer: Self-pay

## 2020-09-23 ENCOUNTER — Emergency Department
Admission: EM | Admit: 2020-09-23 | Discharge: 2020-09-23 | Disposition: A | Discharge status: Home / Self Care | Payer: Medicaid Other | Attending: Emergency Medicine | Admitting: Emergency Medicine

## 2020-09-23 ENCOUNTER — Other Ambulatory Visit: Payer: Self-pay

## 2020-09-23 DIAGNOSIS — M722 Plantar fascial fibromatosis: Secondary | ICD-10-CM | POA: Insufficient documentation

## 2020-09-23 MED ORDER — KETOROLAC TROMETHAMINE 30 MG/ML IJ SOLN
30.0000 mg | Freq: Once | INTRAMUSCULAR | Status: AC
  Administered 2020-09-23: 30 mg via INTRAMUSCULAR
  Filled 2020-09-23: qty 1

## 2020-09-23 MED ORDER — NAPROXEN 500 MG PO TABS: 500.0000 mg | ORAL_TABLET | Freq: Two times a day (BID) | ORAL | 0 refills | Status: AC

## 2020-09-23 NOTE — ED Triage Notes (Signed)
[facility] [place]. Patient is homeless and lives out of his car. C/o left foot pain. Diagnosed with plantar fascitis.

## 2020-09-23 NOTE — ED Triage Notes (Signed)
See previous note, pt here for left foot pain, last seen and dx with plantar fascitis, states he was not px pain meds.  Pt appears disheveled, no obvious deformity noted. Pt states having trouble walking and he is homeless

## 2020-09-23 NOTE — ED Triage Notes (Signed)
Pt with foul smelling odor, offered pt to go to restroom and refused

## 2020-09-23 NOTE — ED Provider Notes (Signed)
[facility] Emergency Department Provider Note   ____________________________________________   Event Date/Time   First MD Initiated Contact with Patient 09/23/20 1828     (approximate)  I have reviewed the triage vital signs and the nursing notes.   HISTORY  Chief Complaint Foot Pain    HPI Christian [name] is a 45 y.o. male with no significant past medical history presents to the ED complaining of foot pain.  Patient reports that he has been dealing with pain primarily in the bottom of his left foot for the past couple of weeks.  He describes it as sharp and located on the plantar surface of his foot, particularly near his heel.  Pain will occasionally feel like a "pulling and stretching" when he bears weight on it.  Bearing weight on the foot does exacerbate his pain.  He denies any recent trauma to his foot, was recently evaluated in the ED with work-up consistent with plantar fasciitis.  He states he has not been able to take any pain medication since then and similar symptoms are starting to affect his right foot.  He denies any swelling or redness in his foot or legs.        Past Medical History:  Diagnosis Date   Allergic rhinitis    Elevated blood pressure reading without diagnosis of hypertension    History of chicken pox    Obesity     Patient Active Problem List   Diagnosis Date Noted   Chest pain 04/06/2011   Obesity    Elevated blood pressure reading without diagnosis of hypertension     Past Surgical History:  Procedure Laterality Date   TONSILLECTOMY AND ADENOIDECTOMY  1985    Prior to Admission medications   Medication Sig Start Date End Date Taking? Authorizing Provider  naproxen (NAPROSYN) 500 MG tablet Take 1 tablet (500 mg total) by mouth 2 (two) times daily with a meal for 6 days. 09/23/20 09/29/20 Yes Chesley Noon, MD  amLODipine (NORVASC) 5 MG tablet Take 1 tablet (5 mg total) by mouth once daily. 09/16/20 12/15/20   Menshew, Charlesetta Ivory, PA-C    Allergies Patient has no known allergies.  Family History  Problem Relation Age of Onset   Coronary artery disease Father 29       MI   Hypertension Father    Hyperlipidemia Father    Diabetes Father    Heart disease Mother        CHF, pacer   Cancer Mother        lymphoma   Stroke Paternal Grandmother    Diabetes Paternal Grandmother     Social History Social History   Tobacco Use   Smoking status: Never   Smokeless tobacco: Never  Substance Use Topics   Alcohol use: No   Drug use: No    Review of Systems  Constitutional: No fever/chills Eyes: No visual changes. ENT: No sore throat. Cardiovascular: Denies chest pain. Respiratory: Denies shortness of breath. Gastrointestinal: No abdominal pain.  No nausea, no vomiting.  No diarrhea.  No constipation. Genitourinary: Negative for dysuria. Musculoskeletal: Negative for back pain.  Positive for foot pain. Skin: Negative for rash. Neurological: Negative for headaches, focal weakness or numbness.  ____________________________________________   PHYSICAL EXAM:  VITAL SIGNS: ED Triage Vitals  Enc Vitals Group     BP 09/23/20 1650 (!) 150/93     Pulse Rate 09/23/20 1650 90     Resp 09/23/20 1650 (!) 22  Temp 09/23/20 1650 98.4 F (36.9 C)     Temp Source 09/23/20 1650 Oral     SpO2 09/23/20 1650 97 %     Weight 09/23/20 1651 (!) 429 lb 14.4 oz (195 kg)     Height 09/23/20 1651 6' (1.829 m)     Head Circumference --      Peak Flow --      Pain Score 09/23/20 1650 3     Pain Loc --      Pain Edu? --      Excl. in GC? --     Constitutional: Alert and oriented.  Morbidly obese. Eyes: Conjunctivae are normal. Head: Atraumatic. Nose: No congestion/rhinnorhea. Mouth/Throat: Mucous membranes are moist. Neck: Normal ROM Cardiovascular: Normal rate, regular rhythm. Grossly normal heart sounds.  2+ DP pulses bilaterally. Respiratory: Normal respiratory effort.  No  retractions. Lungs CTAB. Gastrointestinal: Soft and nontender. No distention. Genitourinary: deferred Musculoskeletal: Tenderness to palpation over plantar surface of bilateral feet, particularly near heels.  No ulceration, erythema, or warmth noted. Neurologic:  Normal speech and language. No gross focal neurologic deficits are appreciated. Skin:  Skin is warm, dry and intact. No rash noted. Psychiatric: Mood and affect are normal. Speech and behavior are normal.  ____________________________________________   LABS (all labs ordered are listed, but only abnormal results are displayed)  Labs Reviewed - No data to display   PROCEDURES  Procedure(s) performed (including Critical Care):  Procedures   ____________________________________________   INITIAL IMPRESSION / ASSESSMENT AND PLAN / ED COURSE      45 year old male with no significant past medical history presents to the ED with 2 weeks of worsening pain to the base of his left foot, now affecting his right foot.  He is neurovascular intact to his bilateral lower extremities, no evidence of ulceration or other infectious process.  He primarily has tenderness along the plantar surface of the foot, describes a stretching consistent with plantar fasciitis.  No recent traumatic mechanism to necessitate x-rays.  We will give a dose of IM Toradol, patient otherwise appropriate for discharge home with podiatry follow-up.  Will prescribe naproxen for home use, he was counseled to return to the ED for new or worsening symptoms.  Patient agrees with plan.      ____________________________________________   FINAL CLINICAL IMPRESSION(S) / ED DIAGNOSES  Final diagnoses:  Plantar fasciitis     ED Discharge Orders          Ordered    naproxen (NAPROSYN) 500 MG tablet  2 times daily with meals        09/23/20 1927             Note:  This document was prepared using Dragon voice recognition software and may include  unintentional dictation errors.    Chesley Noon, MD 09/23/20 Barry Brunner

## 2020-10-09 ENCOUNTER — Emergency Department: Payer: Self-pay

## 2020-10-09 ENCOUNTER — Inpatient Hospital Stay
Admission: EM | Admit: 2020-10-09 | Discharge: 2020-11-30 | DRG: 853 | Disposition: A | Discharge status: Home / Self Care | Payer: Self-pay | Attending: Family Medicine | Admitting: Family Medicine

## 2020-10-09 ENCOUNTER — Other Ambulatory Visit: Payer: Self-pay

## 2020-10-09 DIAGNOSIS — E662 Morbid (severe) obesity with alveolar hypoventilation: Secondary | ICD-10-CM | POA: Diagnosis present

## 2020-10-09 DIAGNOSIS — J90 Pleural effusion, not elsewhere classified: Secondary | ICD-10-CM | POA: Diagnosis present

## 2020-10-09 DIAGNOSIS — N189 Chronic kidney disease, unspecified: Secondary | ICD-10-CM

## 2020-10-09 DIAGNOSIS — L89320 Pressure ulcer of left buttock, unstageable: Secondary | ICD-10-CM | POA: Diagnosis present

## 2020-10-09 DIAGNOSIS — F32A Depression, unspecified: Secondary | ICD-10-CM

## 2020-10-09 DIAGNOSIS — I5032 Chronic diastolic (congestive) heart failure: Secondary | ICD-10-CM

## 2020-10-09 DIAGNOSIS — E876 Hypokalemia: Secondary | ICD-10-CM | POA: Diagnosis present

## 2020-10-09 DIAGNOSIS — L03115 Cellulitis of right lower limb: Secondary | ICD-10-CM | POA: Diagnosis present

## 2020-10-09 DIAGNOSIS — R531 Weakness: Secondary | ICD-10-CM

## 2020-10-09 DIAGNOSIS — K631 Perforation of intestine (nontraumatic): Secondary | ICD-10-CM

## 2020-10-09 DIAGNOSIS — K567 Ileus, unspecified: Secondary | ICD-10-CM

## 2020-10-09 DIAGNOSIS — L03311 Cellulitis of abdominal wall: Principal | ICD-10-CM

## 2020-10-09 DIAGNOSIS — N179 Acute kidney failure, unspecified: Secondary | ICD-10-CM

## 2020-10-09 DIAGNOSIS — E66813 Obesity, class 3: Secondary | ICD-10-CM | POA: Diagnosis present

## 2020-10-09 DIAGNOSIS — Z9889 Other specified postprocedural states: Secondary | ICD-10-CM

## 2020-10-09 DIAGNOSIS — R0602 Shortness of breath: Secondary | ICD-10-CM

## 2020-10-09 DIAGNOSIS — I10 Essential (primary) hypertension: Secondary | ICD-10-CM | POA: Diagnosis present

## 2020-10-09 DIAGNOSIS — J9601 Acute respiratory failure with hypoxia: Secondary | ICD-10-CM

## 2020-10-09 DIAGNOSIS — L89622 Pressure ulcer of left heel, stage 2: Secondary | ICD-10-CM | POA: Diagnosis present

## 2020-10-09 DIAGNOSIS — E860 Dehydration: Secondary | ICD-10-CM | POA: Diagnosis present

## 2020-10-09 DIAGNOSIS — R6520 Severe sepsis without septic shock: Secondary | ICD-10-CM | POA: Diagnosis present

## 2020-10-09 DIAGNOSIS — M793 Panniculitis, unspecified: Secondary | ICD-10-CM | POA: Diagnosis present

## 2020-10-09 DIAGNOSIS — L89612 Pressure ulcer of right heel, stage 2: Secondary | ICD-10-CM | POA: Diagnosis present

## 2020-10-09 DIAGNOSIS — L89159 Pressure ulcer of sacral region, unspecified stage: Secondary | ICD-10-CM | POA: Diagnosis present

## 2020-10-09 DIAGNOSIS — I959 Hypotension, unspecified: Secondary | ICD-10-CM

## 2020-10-09 DIAGNOSIS — N182 Chronic kidney disease, stage 2 (mild): Secondary | ICD-10-CM | POA: Diagnosis present

## 2020-10-09 DIAGNOSIS — Z4659 Encounter for fitting and adjustment of other gastrointestinal appliance and device: Secondary | ICD-10-CM

## 2020-10-09 DIAGNOSIS — L03317 Cellulitis of buttock: Secondary | ICD-10-CM | POA: Diagnosis present

## 2020-10-09 DIAGNOSIS — Z20822 Contact with and (suspected) exposure to covid-19: Secondary | ICD-10-CM | POA: Diagnosis present

## 2020-10-09 DIAGNOSIS — D509 Iron deficiency anemia, unspecified: Secondary | ICD-10-CM | POA: Diagnosis present

## 2020-10-09 DIAGNOSIS — L89310 Pressure ulcer of right buttock, unstageable: Secondary | ICD-10-CM | POA: Diagnosis present

## 2020-10-09 DIAGNOSIS — R7301 Impaired fasting glucose: Secondary | ICD-10-CM

## 2020-10-09 DIAGNOSIS — L89890 Pressure ulcer of other site, unstageable: Secondary | ICD-10-CM | POA: Diagnosis present

## 2020-10-09 DIAGNOSIS — L89309 Pressure ulcer of unspecified buttock, unspecified stage: Secondary | ICD-10-CM | POA: Diagnosis present

## 2020-10-09 DIAGNOSIS — F419 Anxiety disorder, unspecified: Secondary | ICD-10-CM | POA: Diagnosis present

## 2020-10-09 DIAGNOSIS — M7730 Calcaneal spur, unspecified foot: Secondary | ICD-10-CM | POA: Diagnosis present

## 2020-10-09 DIAGNOSIS — Z5331 Laparoscopic surgical procedure converted to open procedure: Secondary | ICD-10-CM

## 2020-10-09 DIAGNOSIS — R197 Diarrhea, unspecified: Secondary | ICD-10-CM | POA: Diagnosis present

## 2020-10-09 DIAGNOSIS — Z5902 Unsheltered homelessness: Secondary | ICD-10-CM

## 2020-10-09 DIAGNOSIS — E669 Obesity, unspecified: Secondary | ICD-10-CM | POA: Diagnosis present

## 2020-10-09 DIAGNOSIS — L89109 Pressure ulcer of unspecified part of back, unspecified stage: Secondary | ICD-10-CM | POA: Diagnosis present

## 2020-10-09 DIAGNOSIS — I130 Hypertensive heart and chronic kidney disease with heart failure and stage 1 through stage 4 chronic kidney disease, or unspecified chronic kidney disease: Secondary | ICD-10-CM | POA: Diagnosis present

## 2020-10-09 DIAGNOSIS — L03116 Cellulitis of left lower limb: Secondary | ICD-10-CM | POA: Diagnosis present

## 2020-10-09 DIAGNOSIS — Z8249 Family history of ischemic heart disease and other diseases of the circulatory system: Secondary | ICD-10-CM

## 2020-10-09 DIAGNOSIS — A419 Sepsis, unspecified organism: Principal | ICD-10-CM | POA: Diagnosis present

## 2020-10-09 DIAGNOSIS — R0902 Hypoxemia: Secondary | ICD-10-CM

## 2020-10-09 DIAGNOSIS — Z79899 Other long term (current) drug therapy: Secondary | ICD-10-CM

## 2020-10-09 DIAGNOSIS — L89892 Pressure ulcer of other site, stage 2: Secondary | ICD-10-CM | POA: Diagnosis present

## 2020-10-09 DIAGNOSIS — J9621 Acute and chronic respiratory failure with hypoxia: Secondary | ICD-10-CM | POA: Diagnosis present

## 2020-10-09 DIAGNOSIS — M19079 Primary osteoarthritis, unspecified ankle and foot: Secondary | ICD-10-CM | POA: Diagnosis present

## 2020-10-09 DIAGNOSIS — R739 Hyperglycemia, unspecified: Secondary | ICD-10-CM | POA: Diagnosis present

## 2020-10-09 DIAGNOSIS — J9622 Acute and chronic respiratory failure with hypercapnia: Secondary | ICD-10-CM

## 2020-10-09 DIAGNOSIS — I9581 Postprocedural hypotension: Secondary | ICD-10-CM | POA: Diagnosis present

## 2020-10-09 DIAGNOSIS — J9811 Atelectasis: Secondary | ICD-10-CM | POA: Diagnosis present

## 2020-10-09 DIAGNOSIS — Z6841 Body Mass Index (BMI) 40.0 and over, adult: Secondary | ICD-10-CM

## 2020-10-09 DIAGNOSIS — M722 Plantar fascial fibromatosis: Secondary | ICD-10-CM | POA: Diagnosis present

## 2020-10-09 DIAGNOSIS — Z23 Encounter for immunization: Secondary | ICD-10-CM

## 2020-10-09 DIAGNOSIS — K659 Peritonitis, unspecified: Secondary | ICD-10-CM | POA: Diagnosis present

## 2020-10-09 LAB — C DIFFICILE QUICK SCREEN W PCR REFLEX
C Diff antigen: NEGATIVE
C Diff interpretation: NOT DETECTED
C Diff toxin: NEGATIVE

## 2020-10-09 LAB — URINALYSIS, COMPLETE (UACMP) WITH MICROSCOPIC
Bacteria, UA: NONE SEEN
Bilirubin Urine: NEGATIVE
Glucose, UA: NEGATIVE mg/dL
Hgb urine dipstick: NEGATIVE
Ketones, ur: NEGATIVE mg/dL
Leukocytes,Ua: NEGATIVE
Nitrite: NEGATIVE
Protein, ur: 30 mg/dL — AB
Specific Gravity, Urine: >1.046 — ABNORMAL HIGH (ref 1.005–1.030)
pH: 5 (ref 5.0–8.0)

## 2020-10-09 LAB — CBC WITH DIFFERENTIAL/PLATELET
Abs Immature Granulocytes: 0.34 10*3/uL — ABNORMAL HIGH (ref 0.00–0.07)
Basophils Absolute: 0.1 10*3/uL (ref 0.0–0.1)
Basophils Relative: 0 %
Eosinophils Absolute: 0.1 10*3/uL (ref 0.0–0.5)
Eosinophils Relative: 0 %
HCT: 42.4 % (ref 39.0–52.0)
Hemoglobin: 13.9 g/dL (ref 13.0–17.0)
Immature Granulocytes: 2 %
Lymphocytes Relative: 5 %
Lymphs Abs: 1.2 10*3/uL (ref 0.7–4.0)
MCH: 27.2 pg (ref 26.0–34.0)
MCHC: 32.8 g/dL (ref 30.0–36.0)
MCV: 83 fL (ref 80.0–100.0)
Monocytes Absolute: 2.1 10*3/uL — ABNORMAL HIGH (ref 0.1–1.0)
Monocytes Relative: 9 %
Neutro Abs: 19.5 10*3/uL — ABNORMAL HIGH (ref 1.7–7.7)
Neutrophils Relative %: 84 %
Platelets: 509 10*3/uL — ABNORMAL HIGH (ref 150–400)
RBC: 5.11 MIL/uL (ref 4.22–5.81)
RDW: 13 % (ref 11.5–15.5)
WBC: 23.2 10*3/uL — ABNORMAL HIGH (ref 4.0–10.5)
nRBC: 0 % (ref 0.0–0.2)

## 2020-10-09 LAB — CK: Total CK: 111 U/L (ref 49–397)

## 2020-10-09 LAB — COMPREHENSIVE METABOLIC PANEL
ALT: 24 U/L (ref 0–44)
AST: 24 U/L (ref 15–41)
Albumin: 2.8 g/dL — ABNORMAL LOW (ref 3.5–5.0)
Alkaline Phosphatase: 60 U/L (ref 38–126)
Anion gap: 13 (ref 5–15)
BUN: 13 mg/dL (ref 6–20)
CO2: 25 mmol/L (ref 22–32)
Calcium: 8.3 mg/dL — ABNORMAL LOW (ref 8.9–10.3)
Chloride: 97 mmol/L — ABNORMAL LOW (ref 98–111)
Creatinine, Ser: 1.43 mg/dL — ABNORMAL HIGH (ref 0.61–1.24)
GFR, Estimated: >60 mL/min (ref >60)
Glucose, Bld: 204 mg/dL — ABNORMAL HIGH (ref 70–99)
Potassium: 3.1 mmol/L — ABNORMAL LOW (ref 3.5–5.1)
Sodium: 135 mmol/L (ref 135–145)
Total Bilirubin: 1.5 mg/dL — ABNORMAL HIGH (ref 0.3–1.2)
Total Protein: 7 g/dL (ref 6.5–8.1)

## 2020-10-09 LAB — RESP PANEL BY RT-PCR (FLU A&B, COVID) ARPGX2
Influenza A by PCR: NEGATIVE
Influenza B by PCR: NEGATIVE
SARS Coronavirus 2 by RT PCR: NEGATIVE

## 2020-10-09 LAB — HEMOGLOBIN A1C
Hgb A1c MFr Bld: 6 % — ABNORMAL HIGH (ref 4.8–5.6)
Mean Plasma Glucose: 125.5 mg/dL

## 2020-10-09 LAB — GLUCOSE, CAPILLARY
Glucose-Capillary: 172 mg/dL — ABNORMAL HIGH (ref 70–99)
Glucose-Capillary: 137 mg/dL — ABNORMAL HIGH (ref 70–99)

## 2020-10-09 LAB — PROCALCITONIN: Procalcitonin: 0.46 ng/mL

## 2020-10-09 LAB — TROPONIN I (HIGH SENSITIVITY)
Troponin I (High Sensitivity): 15 ng/L (ref <18)
Troponin I (High Sensitivity): 11 ng/L (ref <18)

## 2020-10-09 LAB — LIPASE, BLOOD: Lipase: 23 U/L (ref 11–51)

## 2020-10-09 LAB — TSH: TSH: 2.65 u[IU]/mL (ref 0.350–4.500)

## 2020-10-09 LAB — LACTIC ACID, PLASMA
Lactic Acid, Venous: 1.7 mmol/L (ref 0.5–1.9)
Lactic Acid, Venous: 1.1 mmol/L (ref 0.5–1.9)

## 2020-10-09 MED ORDER — ONDANSETRON HCL 4 MG/2ML IJ SOLN
4.0000 mg | Freq: Four times a day (QID) | INTRAMUSCULAR | Status: DC | PRN
  Administered 2020-10-10: 4 mg via INTRAVENOUS
  Filled 2020-10-09: qty 2

## 2020-10-09 MED ORDER — POTASSIUM CHLORIDE 2 MEQ/ML IV SOLN
INTRAVENOUS | Status: DC
  Filled 2020-10-09 (×5): qty 1000

## 2020-10-09 MED ORDER — LABETALOL HCL 5 MG/ML IV SOLN: 5.0000 mg | INTRAVENOUS | Status: AC | PRN

## 2020-10-09 MED ORDER — IOHEXOL 350 MG/ML SOLN
125.0000 mL | Freq: Once | INTRAVENOUS | Status: AC | PRN
  Administered 2020-10-09: 125 mL via INTRAVENOUS

## 2020-10-09 MED ORDER — COLLAGENASE 250 UNIT/GM EX OINT
TOPICAL_OINTMENT | Freq: Every day | CUTANEOUS | Status: DC
  Administered 2020-10-25 – 2020-11-23 (×4): 1 via TOPICAL
  Filled 2020-10-09 (×8): qty 30

## 2020-10-09 MED ORDER — VANCOMYCIN HCL 1750 MG/350ML IV SOLN
1750.0000 mg | Freq: Two times a day (BID) | INTRAVENOUS | Status: DC
  Administered 2020-10-10 – 2020-10-12 (×6): 1750 mg via INTRAVENOUS
  Filled 2020-10-09 (×7): qty 350

## 2020-10-09 MED ORDER — VANCOMYCIN HCL 1500 MG/300ML IV SOLN
1500.0000 mg | Freq: Once | INTRAVENOUS | Status: DC
  Administered 2020-10-09: 1500 mg via INTRAVENOUS
  Filled 2020-10-09 (×2): qty 300

## 2020-10-09 MED ORDER — KCL-LACTATED RINGERS 20 MEQ/L IV SOLN
INTRAVENOUS | Status: DC
  Filled 2020-10-09: qty 1000

## 2020-10-09 MED ORDER — INSULIN ASPART 100 UNIT/ML IJ SOLN
0.0000 [IU] | Freq: Every day | INTRAMUSCULAR | Status: DC
Start: 2020-10-09 — End: 2020-10-10

## 2020-10-09 MED ORDER — LACTATED RINGERS IV BOLUS
1000.0000 mL | Freq: Once | INTRAVENOUS | Status: AC
  Administered 2020-10-09: 1000 mL via INTRAVENOUS

## 2020-10-09 MED ORDER — INSULIN ASPART 100 UNIT/ML IJ SOLN
0.0000 [IU] | Freq: Three times a day (TID) | INTRAMUSCULAR | Status: DC
  Administered 2020-10-10 (×2): 1 [IU] via SUBCUTANEOUS
  Filled 2020-10-09 (×2): qty 1

## 2020-10-09 MED ORDER — AMLODIPINE BESYLATE 5 MG PO TABS
5.0000 mg | ORAL_TABLET | Freq: Every day | ORAL | Status: DC
  Administered 2020-10-10 – 2020-10-15 (×6): 5 mg via ORAL
  Filled 2020-10-09 (×6): qty 1

## 2020-10-09 MED ORDER — ENOXAPARIN SODIUM 40 MG/0.4ML IJ SOSY: 40.0000 mg | PREFILLED_SYRINGE | INTRAMUSCULAR | Status: DC

## 2020-10-09 MED ORDER — SODIUM CHLORIDE 0.9 % IV SOLN
1.0000 g | Freq: Once | INTRAVENOUS | Status: AC
  Administered 2020-10-09: 1 g via INTRAVENOUS
  Filled 2020-10-09: qty 10

## 2020-10-09 MED ORDER — ENOXAPARIN SODIUM 100 MG/ML IJ SOSY
0.5000 mg/kg | PREFILLED_SYRINGE | INTRAMUSCULAR | Status: DC
  Administered 2020-10-09 – 2020-10-14 (×6): 97.5 mg via SUBCUTANEOUS
  Filled 2020-10-09 (×8): qty 0.97

## 2020-10-09 MED ORDER — ACETAMINOPHEN 325 MG PO TABS
650.0000 mg | ORAL_TABLET | Freq: Four times a day (QID) | ORAL | Status: DC | PRN
  Administered 2020-11-15: 650 mg via ORAL
  Filled 2020-10-09: qty 2

## 2020-10-09 MED ORDER — SODIUM CHLORIDE 0.9 % IV SOLN: 2.0000 g | INTRAVENOUS | Status: DC

## 2020-10-09 MED ORDER — VANCOMYCIN HCL IN DEXTROSE 1-5 GM/200ML-% IV SOLN
1000.0000 mg | Freq: Once | INTRAVENOUS | Status: AC
  Administered 2020-10-09: 1000 mg via INTRAVENOUS
  Filled 2020-10-09: qty 200

## 2020-10-09 MED ORDER — ACETAMINOPHEN 650 MG RE SUPP
650.0000 mg | Freq: Four times a day (QID) | RECTAL | Status: DC | PRN
  Filled 2020-10-09: qty 1

## 2020-10-09 MED ORDER — ONDANSETRON HCL 4 MG PO TABS: 4.0000 mg | ORAL_TABLET | Freq: Four times a day (QID) | ORAL | Status: DC | PRN

## 2020-10-09 NOTE — ED Provider Notes (Signed)
[facility] Emergency Department Provider Note   ____________________________________________   Event Date/Time   First MD Initiated Contact with Patient 10/09/20 [phone]     (approximate)  I have reviewed the triage vital signs and the nursing notes.   HISTORY  Chief Complaint Abdominal Pain    HPI Christian [name] is a 45 y.o. male with past medical history of hypertension who presents to the ED complaining of abdominal pain.  Patient reports that he has been dealing with intermittent pain in his lower abdomen for the past couple of days.  He describes the pain as sharp and cramping, not exacerbated or alleviated by anything in particular.  Pain was worse this morning after he tried to eat something, states he vomited it back up and then the pain seemed to get more severe.  He denies any changes in his bowel movements and he has not had any dysuria, hematuria, or flank pain.  He denies any fevers, cough, or chest pain, does state that he has been feeling short of breath since the time when the ambulance came to get him.  He reports he is currently homeless and living out of his car.        Past Medical History:  Diagnosis Date   Allergic rhinitis    Elevated blood pressure reading without diagnosis of hypertension    History of chicken pox    Obesity     Patient Active Problem List   Diagnosis Date Noted   Chest pain 04/06/2011   Obesity    Elevated blood pressure reading without diagnosis of hypertension     Past Surgical History:  Procedure Laterality Date   TONSILLECTOMY AND ADENOIDECTOMY  1985    Prior to Admission medications   Medication Sig Start Date End Date Taking? Authorizing Provider  amLODipine (NORVASC) 5 MG tablet Take 1 tablet (5 mg total) by mouth once daily. 09/16/20 12/15/20  Menshew, Charlesetta Ivory, PA-C    Allergies Patient has no known allergies.  Family History  Problem Relation Age of Onset   Coronary artery  disease Father 23       MI   Hypertension Father    Hyperlipidemia Father    Diabetes Father    Heart disease Mother        CHF, pacer   Cancer Mother        lymphoma   Stroke Paternal Grandmother    Diabetes Paternal Grandmother     Social History Social History   Tobacco Use   Smoking status: Never   Smokeless tobacco: Never  Substance Use Topics   Alcohol use: No   Drug use: No    Review of Systems  Constitutional: No fever/chills Eyes: No visual changes. ENT: No sore throat. Cardiovascular: Denies chest pain. Respiratory: Positive for shortness of breath. Gastrointestinal: Positive for abdominal pain, nausea, and vomiting.  No diarrhea.  No constipation. Genitourinary: Negative for dysuria. Musculoskeletal: Negative for back pain. Skin: Negative for rash. Neurological: Negative for headaches, focal weakness or numbness.  ____________________________________________   PHYSICAL EXAM:  VITAL SIGNS: ED Triage Vitals  Enc Vitals Group     BP      Pulse      Resp      Temp      Temp src      SpO2      Weight      Height      Head Circumference      Peak Flow  Pain Score      Pain Loc      Pain Edu?      Excl. in GC?     Constitutional: Alert and oriented. Eyes: Conjunctivae are normal. Head: Atraumatic. Nose: No congestion/rhinnorhea. Mouth/Throat: Mucous membranes are moist. Neck: Normal ROM Cardiovascular: Normal rate, regular rhythm. Grossly normal heart sounds.  2+ radial pulses bilaterally. Respiratory: Normal respiratory effort.  No retractions. Lungs CTAB. Gastrointestinal: Soft and nontender.  Erythema, edema, and warmth noted over lower abdomen with focal areas of purulence, no fluctuance noted.  No distention. Genitourinary: deferred Musculoskeletal: No lower extremity tenderness nor edema. Neurologic:  Normal speech and language. No gross focal neurologic deficits are appreciated. Skin:  Skin is warm, dry and intact. No rash  noted. Psychiatric: Mood and affect are normal. Speech and behavior are normal.  ____________________________________________   LABS (all labs ordered are listed, but only abnormal results are displayed)  Labs Reviewed  CBC WITH DIFFERENTIAL/PLATELET - Abnormal; Notable for the following components:      Result Value   WBC 23.2 (*)    Platelets 509 (*)    Neutro Abs 19.5 (*)    Monocytes Absolute 2.1 (*)    Abs Immature Granulocytes 0.34 (*)    All other components within normal limits  COMPREHENSIVE METABOLIC PANEL - Abnormal; Notable for the following components:   Potassium 3.1 (*)    Chloride 97 (*)    Glucose, Bld 204 (*)    Creatinine, Ser 1.43 (*)    Calcium 8.3 (*)    Albumin 2.8 (*)    Total Bilirubin 1.5 (*)    All other components within normal limits  RESP PANEL BY RT-PCR (FLU A&B, COVID) ARPGX2  LIPASE, BLOOD  URINALYSIS, COMPLETE (UACMP) WITH MICROSCOPIC  CK  TROPONIN I (HIGH SENSITIVITY)  TROPONIN I (HIGH SENSITIVITY)   ____________________________________________  EKG  ED ECG REPORT I, Chesley Noon, the attending physician, personally viewed and interpreted this ECG.   Date: 10/09/2020  EKG Time: 8:05  Rate: 103  Rhythm: sinus tachycardia  Axis: Normal  Intervals:none  ST&T Change: None   PROCEDURES  Procedure(s) performed (including Critical Care):  Procedures   ____________________________________________   INITIAL IMPRESSION / ASSESSMENT AND PLAN / ED COURSE      45 year old male with past medical history of hypertension presents to the ED with intermittent bilateral lower quadrant abdominal pain associated with an episode of nausea and vomiting earlier today.  Patient with what appears to be cellulitis versus intertrigo to lower abdominal wall..  We will further assess with labs including LFTs, lipase, and UA but symptoms seem most likely to be caused by infection to his lower abdominal wall.  Patient does report feeling short of  breath since the time he was picked up by the ambulance, is not in any respiratory distress and is maintaining O2 sats on room air.  He appears more comfortable now that he has been repositioned in stretcher, EKG shows no evidence of arrhythmia or ischemia.  We will screen troponin and chest x-ray, low suspicion for ACS or PE at this time.  Chest x-ray reviewed by me and shows no infiltrate, edema, or effusion.  Patient noted to have marked leukocytosis, CT scan was performed of his abdomen/pelvis and shows questionable ileus along with lymphadenopathy.  Lymphadenopathy likely due to intertrigo and associated cellulitis of his abdominal wall, no evidence of abscess.  I was also notified by nursing that patient with very dark urine, no bilirubinemia to explain this and  I would be concerned for rhabdo.  We will hydrate with IV fluids and add on CK level.  Case discussed with hospitalist for admission.      ____________________________________________   FINAL CLINICAL IMPRESSION(S) / ED DIAGNOSES  Final diagnoses:  Cellulitis of abdominal wall     ED Discharge Orders     None        Note:  This document was prepared using Dragon voice recognition software and may include unintentional dictation errors.    Chesley Noon, MD 10/09/20 [phone]

## 2020-10-09 NOTE — ED Triage Notes (Signed)
BIB EMS for abdominal pain x 3 days.   Pt states last BM was one day ago and it was normal. He goes once a day. Pt states he is homeless. Last PO meal was last night but he was unable to keep it down.

## 2020-10-09 NOTE — Consult Note (Signed)
Patient ID: Christian [name], male   DOB: 10/16/1975, 45 y.o.   MRN: [id number]  HPI Christian [name] is a 45 y.o. male seen in consultation at the request of Dr. COx. Case d/w her in detail.  He came in early this morning complaining of a 2 to 3-day history of abdominal pain.  He reports abdominal pain is in the lower abdomen that he has developed a rash.  He also reports associated diarrhea.  The abdominal pain is colicky and intermittent.  He does have a history of plantar fasciitis on his left foot and also complains of left foot pain.  He has significant comorbidities including super morbid obesity and he is homeless.  He has a lot of social issues and has been living in his car for the last 10 months.  He also reports decreased appetite.  I was asked to see him regarding his cellulitis and panniculitis on his lower abdomen as well as a pressure ulcer in his left buttocks.  Comparing the images from the chart his lower abdominal panniculitis seems to have improved.  He has been started on broad-spectrum antibiotics and further pending laboratory for C. difficile it still in the works.  He did have a CT scan of the abdomen and pelvis that I have personally reviewed there is evidence of diffuse colon dilation.  There is no evidence of free air.  There is no evidence of any necrotizing infection around the buttocks although this is limited due to his size. He did have CBC with a white count of 23,000 with a left shift.  The rest of his CBC was normal.  CMP shows evidence of an acute kidney injury with a creatinine of 1.4.  He does have a blood sugar that is elevated in the 204 and a potassium of 3.1.  His albumin is only 2.8   HPI  Past Medical History:  Diagnosis Date   Allergic rhinitis    Elevated blood pressure reading without diagnosis of hypertension    History of chicken pox    Obesity     Past Surgical History:  Procedure Laterality Date   TONSILLECTOMY AND ADENOIDECTOMY  1985     Family History  Problem Relation Age of Onset   Coronary artery disease Father 63       MI   Hypertension Father    Hyperlipidemia Father    Diabetes Father    Heart disease Mother        CHF, pacer   Cancer Mother        lymphoma   Stroke Paternal Grandmother    Diabetes Paternal Grandmother     Social History Social History   Tobacco Use   Smoking status: Never   Smokeless tobacco: Never  Substance Use Topics   Alcohol use: No   Drug use: No    No Known Allergies  Current Facility-Administered Medications  Medication Dose Route Frequency Provider Last Rate Last Admin   acetaminophen (TYLENOL) tablet 650 mg  650 mg Oral Q6H PRN Cox, Amy N, DO       Or   acetaminophen (TYLENOL) suppository 650 mg  650 mg Rectal Q6H PRN Cox, Amy N, DO       [START ON 10/10/2020] amLODipine (NORVASC) tablet 5 mg  5 mg Oral Daily Cox, Amy N, DO       collagenase (SANTYL) ointment   Topical Daily Cox, Amy N, DO       enoxaparin (LOVENOX) injection 97.5 mg  0.5 mg/kg Subcutaneous Q24H Cox, Amy N, DO       insulin aspart (novoLOG) injection 0-5 Units  0-5 Units Subcutaneous QHS Cox, Amy N, DO       insulin aspart (novoLOG) injection 0-9 Units  0-9 Units Subcutaneous TID WC Cox, Amy N, DO       labetalol (NORMODYNE) injection 5 mg  5 mg Intravenous Q2H PRN Cox, Amy N, DO       lactated ringers 1,000 mL with potassium chloride 20 mEq infusion   Intravenous Continuous Cox, Amy N, DO       ondansetron (ZOFRAN) tablet 4 mg  4 mg Oral Q6H PRN Cox, Amy N, DO       Or   ondansetron (ZOFRAN) injection 4 mg  4 mg Intravenous Q6H PRN Cox, Amy N, DO       vancomycin (VANCOREADY) IVPB 1500 mg/300 mL  1,500 mg Intravenous Once Cox, Amy N, DO       [START ON 10/10/2020] vancomycin (VANCOREADY) IVPB 1750 mg/350 mL  1,750 mg Intravenous Q12H Derrek Gu, RPH         Review of Systems Full ROS  was asked and was negative except for the information on the HPI  Physical Exam Blood pressure 138/87,  pulse 98, temperature 98.8 F (37.1 C), temperature source Oral, resp. rate 20, height 6\' 3"  (1.905 m), weight (!) 197.3 kg, SpO2 95 %. CONSTITUTIONAL: Patient is in no acute distress.  Super morbidly obese with a BMI of 54 EYES: Pupils are equal, round,  Sclera are non-icteric. EARS, NOSE, MOUTH AND THROAT: He is wearing a mask. Hearing is intact to voice. LYMPH NODES:  Lymph nodes in the neck are normal. RESPIRATORY:  Lungs are clear. There is normal respiratory effort, with equal breath sounds bilaterally, and without pathologic use of accessory muscles. CARDIOVASCULAR: Heart is regular without murmurs, gallops, or rubs. GI: The abdomen is  soft, nontender, and nondistended. There are no palpable masses. There is no hepatosplenomegaly. There are normal bowel sounds . There is evidence of intertrigo and panniculitis due to large pannus.  There is mild inflammatory response but there is no evidence of necrotizing infection or abscess GU: Rectal deferred.   MUSCULOSKELETAL: Normal muscle strength and tone. No cyanosis or edema.   SKIN: Turgor is good and there is evidence of Left buttock with pressure ulcer; 10X5cm, there is an adhered eschar , no evidence of abscess or necrotizing infection.  NEUROLOGIC: Motor and sensation is grossly normal. Cranial nerves are grossly intact. PSYCH:  Oriented to person, place and time. Affect is normal.  Data Reviewed  I have personally reviewed the patient's imaging, laboratory findings and medical records.    Assessment/Plan 45 year old super morbidly obese male with a lot of social issues now presents with panniculitis and cellulitis of his lower abdominal wall.  This likely has some yeast contamination.  At this time I recommend aggressive fluid resuscitation as well as broad-spectrum antibiotics to treat that cellulitis and panniculitis of the abdominal wall.  Regarding the pressure ulcers I do think that they do not require debridement at this time and  will require pressure offload as well as local wound care.  He does also have potential colitis C. difficile related.  I will follow-up with an abdominal x-ray tomorrow to make sure that we will going the right direction.  At this time I do not think he requires any surgical intervention we will continue to follow him  48, MD FACS  General Surgeon 10/09/2020, 6:40 PM

## 2020-10-09 NOTE — ED Notes (Signed)
Pt taken to decontamination room due to being covered in feces.

## 2020-10-09 NOTE — Plan of Care (Signed)
Continuing with plan of care. 

## 2020-10-09 NOTE — ED Notes (Signed)
Crystal RN aware of assigned bed 
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
## 2020-10-09 NOTE — H&P (Signed)
History and Physical   Christian [name] JOA:[id number] DOB: Jul 16, 1975 DOA: 10/09/2020  PCP: Pcp, No  Patient coming from: Homeless/his car via EMS  I have personally briefly reviewed patient's old medical records in [facility] EMR.  Chief Concern: Abdominal pain  HPI: Christian [name] is a 45 y.o. male with medical history significant for morbid obesity, hypertension presents to the emergency department via EMS for chief concerns of bilateral lower abdominal pain.  He reports that the pain is in the bilateral lower abdominal region, described as sharp, 5/10 and is now improved to a 4/10. He vomited twice today after eating. He denies fever, chest pain, dysuria. He endorses feeling winded when getting from the ambulance to the ED bed. He could still breath.  He does endorse diarrhea that is brown liquidy stool.  He soiled himself because he states he was feeling so weak from the abdominal pain, he could not get up out of his car.  Social history: He is currently homeless. He denies tobacco, etoh, recreational drug. He currently does not work and formerly worked in [employer], working in [employer].   Vaccination history: He is vaccinated for covid 19, two doses of Moderna.   ROS: Constitutional: no weight change, no fever ENT/Mouth: no sore throat, no rhinorrhea Eyes: no eye pain, no vision changes Cardiovascular: no chest pain, + dyspnea,  no edema, no palpitations Respiratory: no cough, no sputum, no wheezing Gastrointestinal: no nausea, no vomiting, no diarrhea, no constipation Genitourinary: no urinary incontinence, no dysuria, no hematuria Musculoskeletal: no arthralgias, no myalgias Skin: + skin lesions, no pruritus Neuro: + weakness, no loss of consciousness, no syncope Psych: no anxiety, no depression, + decrease appetite Heme/Lymph: no bruising, no bleeding  ED Course: Discussed with emergency medicine provider, patient requiring hospitalization for meeting  sepsis versus cellulitis.  Vitals in the emergency department was remarkable for temperature 98.6, respiration rate of 24, heart rate 105, initial blood pressure 141/98, SPO2 of 96% on room air.  Labs in the emergency department was remarkable for WBC 23.3, hemoglobin 13.9, platelets 509.  Assessment/Plan  Principal Problem:   Sepsis (HCC) Active Problems:   Obesity   Hyperglycemia   AKI (acute kidney injury) (HCC)   Pressure ulcer of back   Pressure ulcer of buttock   Essential hypertension   # Meet sepsis criteria - Increased respiration rate, increased heart rate, WBC 23.3, source of cellulitis in the pannus versus sacral decubitus, with organ involvement of renal/acute kidney injury - Status post 1 L lactated ringer bolus per EDP, I will add lactated ringer with KCl at 20 mEq/L infusion at a rate of 125 mL/h, for 8 hours to complete 1 L bag - Check MRSA - Continue vancomycin per pharmacy - Check blood cultures x2, follow-up on UA and urine culture  # Acute kidney injury-this is multifactorial including sepsis versus prerenal secondary to dehydration in setting GI loss from diarrhea - Baseline serum creatinine in the last year has been 1.19-1.26, with serum creatinine 3 weeks ago being 1.19 - GFR remains greater than 60 - Check CK add-on ordered by EDP  # Bilateral sacral decubitus ulcer and pannus wound-present on admission - Wound care consulted - General surgery has been consulted for debridement  # Hypertension-as patient is currently meeting sepsis criteria, I will resume amlodipine 5 mg daily for 10/10/2020 - Labetalol 5 mg IV every 2 hours as needed for SBP greater than 170, 1 day ordered  # Morbid obesity-extensive discussion with  healthy diet and exercise - Decreasing fatty foods and high caloric food and replacing it with vegetables and lean meats - When patient is more stable I have advised patient to find a mall and walk for about 10 minutes initially and  gradually increasing it to 15, 10 to 20 minutes, then 30 minutes 5 to 7 days/week, as tolerated  # Hyperglycemia-no prior diagnosis of diabetes - Insulin SSI with at bedtime coverage dosing for renal has been ordered - A1c in the morning  # Plantar fasciitis -advised patient to do frequent stretching of his feet with plantar, dorsi, flexion  Chart reviewed.   DVT prophylaxis: TED hose, enoxaparin weight-based Code Status: Full code Diet: Heart healthy/carb modified Family Communication: No Disposition Plan: Pending clinical course Consults called: None at this time Admission status: MedSurg, observation, telemetry for 24 hours ordered  Past Medical History:  Diagnosis Date   Allergic rhinitis    Elevated blood pressure reading without diagnosis of hypertension    History of chicken pox    Obesity    Past Surgical History:  Procedure Laterality Date   TONSILLECTOMY AND ADENOIDECTOMY  1985   Social History:  reports that he has never smoked. He has never used smokeless tobacco. He reports that he does not drink alcohol and does not use drugs.  No Known Allergies Family History  Problem Relation Age of Onset   Coronary artery disease Father [id number]       MI   Hypertension Father    Hyperlipidemia Father    Diabetes Father    Heart disease Mother        CHF, pacer   Cancer Mother        lymphoma   Stroke Paternal Grandmother    Diabetes Paternal Grandmother    Family history: Family history reviewed and pertinent for hypertension in father, diabetes in paternal grandmother  Prior to Admission medications   Medication Sig Start Date End Date Taking? Authorizing Provider  amLODipine (NORVASC) 5 MG tablet Take 1 tablet (5 mg total) by mouth once daily. 09/16/20 12/15/20  Menshew, Charlesetta IvoryJenise V Bacon, PA-C   Physical Exam: Vitals:   10/09/20 1000 10/09/20 1109 10/09/20 1130 10/09/20 1200  BP: 123/61 (!) 151/83 (!) 141/94 (!) 141/93  Pulse: 99 96 94 94  Resp: (!) 24 19 (!) 27 (!)  21  Temp:      TempSrc:      SpO2: 95% 97% 96% 91%  Weight:      Height:       Constitutional: appears older than chronological age, NAD, calm, comfortable Eyes: PERRL, lids and conjunctivae normal ENMT: Mucous membranes are moist. Posterior pharynx clear of any exudate or lesions. Age-appropriate dentition. Hearing appropriate Neck: normal, supple, no masses, no thyromegaly Respiratory: clear to auscultation bilaterally, no wheezing, no crackles. Normal respiratory effort. No accessory muscle use.  Cardiovascular: Regular rate and rhythm, no murmurs / rubs / gallops. 2+ pitting extremity edema. 2+ pedal pulses. No carotid bruits.  Abdomen: Morbidly obese abdomen with pannus, no tenderness, no masses palpated, no hepatosplenomegaly. Bowel sounds positive.  Musculoskeletal: no clubbing / cyanosis. No joint deformity upper and lower extremities. Good ROM, no contractures, no atrophy. Normal muscle tone.  Skin: + rashes, lesions, ulcers.   Neurologic: Sensation intact. Strength 5/5 in all 4.  Psychiatric: Normal judgment and insight. Alert and oriented x 3. Normal mood.   EKG: independently reviewed, showing sinus tachycardia with rate of 103, Qtc 476  Chest x-ray on Admission: I personally reviewed  and I agree with radiologist reading as below.  DG Chest 2 View  Result Date: 10/09/2020 CLINICAL DATA:  Shortness of breath and cough.  Vomiting/gagging. EXAM: CHEST - 2 VIEW COMPARISON:  09/23/2019 FINDINGS: Airway thickening is present, suggesting bronchitis or reactive airways disease. Body habitus reduces diagnostic sensitivity and specificity. No airspace opacity or blunting of the costophrenic angles. Lower thoracic spondylosis with bridging spurring anterior to the vertebral body column, as on 09/23/2019. IMPRESSION: 1. Airway thickening is present, suggesting bronchitis or reactive airways disease. 2. Lower thoracic spondylosis. Electronically Signed   By: Gaylyn Rong M.D.   On:  10/09/2020 08:51   CT Abdomen Pelvis W Contrast  Result Date: 10/09/2020 CLINICAL DATA:  Abdominal pain, nonlocalized. EXAM: CT ABDOMEN AND PELVIS WITH CONTRAST TECHNIQUE: Multidetector CT imaging of the abdomen and pelvis was performed using the standard protocol following bolus administration of intravenous contrast. CONTRAST:  OMNIPAQUE IOHEXOL 350 MG/ML SOLN COMPARISON:  None. FINDINGS: Evaluation is somewhat limited by motion artifact. Lower chest: No acute abnormality. Hepatobiliary: The liver is normal in contour. No focal hepatic lesions. No intra or extrahepatic biliary ductal dilatation. The portal vein is patent. No gallbladder wall thickening or pericholecystic fluid. No radiopaque stones. Pancreas: Mild fatty atrophy. No pancreatic ductal dilatation or surrounding inflammatory changes. Spleen: Normal in size without focal abnormality.  Splenule. Adrenals/Urinary Tract: The adrenal glands are unremarkable. The kidneys enhance symmetrically with no hydronephrosis. Hypoattenuating lesion in the left mid kidney, most likely a renal cyst. The ureters and bladder are unremarkable. Stomach/Bowel: The stomach is unremarkable. The small bowel is mildly dilated distally. The colon is dilated, measuring up to 8.8 cm near the hepatic flexure and 7.3 cm in the descending colon. No bowel wall thickening, pneumatosis, or portal venous gas. No evidence of obstruction. The appendix is unremarkable. Trace fluid in the left pericolic gutter. Vascular/Lymphatic: The aorta is normal in caliber, and the central aspect of its branch vessels are patent. Prominent retroperitoneal and mesenteric lymph nodes (series 2, image 61), measuring up to 1.1 cm in short axis. Enlarged pelvic lymph nodes (for example series 2, image 107), which appear to retain their reniform shape, likely reactive. Enlarged inguinal lymph nodes, which measure up to 3.1 cm on the right (series 2, image 136) and 1.9 cm on the left (series 2, image  123), which appear to retain fatty hila. Reproductive: Prostate is unremarkable. Other: No free air. Musculoskeletal: Degenerative changes in imaged spine. No acute fracture. IMPRESSION: 1. Dilated distal small bowel and colon without transition point, possibly due to ileus, although early or partial small bowel obstruction could appear similar. 2. Enlarged pelvic and inguinal lymph nodes, which are nonspecific and most likely reactive, but concerning for more distal infection, possibly in the lower extremities. Additional prominent retroperitoneal and mesenteric lymph nodes are also favored to be reactive. Electronically Signed   By: Wiliam Ke MD   On: 10/09/2020 11:27    Labs on Admission: I have personally reviewed following labs  CBC: Recent Labs  Lab 10/09/20 0811  WBC 23.2*  NEUTROABS 19.5*  HGB 13.9  HCT 42.4  MCV 83.0  PLT 509*   Basic Metabolic Panel: Recent Labs  Lab 10/09/20 0811  NA 135  K 3.1*  CL 97*  CO2 25  GLUCOSE 204*  BUN 13  CREATININE 1.43*  CALCIUM 8.3*   GFR: Estimated Creatinine Clearance: 118.3 mL/min (A) (by C-G formula based on SCr of 1.43 mg/dL (H)).  Liver Function Tests: Recent Labs  Lab 10/09/20 0811  AST 24  ALT 24  ALKPHOS 60  BILITOT 1.5*  PROT 7.0  ALBUMIN 2.8*   Recent Labs  Lab 10/09/20 1610  LIPASE 23   Dr. Sedalia Muta [facility]  If 7PM-7AM, please contact overnight-coverage provider If 7AM-7PM, please contact day coverage provider [email or web address]  10/09/2020, 2:11 PM

## 2020-10-09 NOTE — Progress Notes (Signed)
PHARMACIST - PHYSICIAN COMMUNICATION  CONCERNING:  Enoxaparin (Lovenox) for DVT Prophylaxis    RECOMMENDATION: Patient was prescribed enoxaprin 40mg  q24 hours for VTE prophylaxis.   Filed Weights   10/09/20 0807  Weight: (!) 197.3 kg (435 lb)    Body mass index is 54.37 kg/m.  Estimated Creatinine Clearance: 118.3 mL/min (A) (by C-G formula based on SCr of 1.43 mg/dL (H)).   Based on [facility] policy patient is candidate for enoxaparin 0.5mg /kg TBW SQ every 24 hours based on BMI being >30.  DESCRIPTION: Pharmacy has adjusted enoxaparin dose per [facility] policy.  Patient is now receiving enoxaparin 97.5 mg every 24 hours    [facility], PharmD Clinical Pharmacist  10/09/2020 12:41 PM

## 2020-10-09 NOTE — Consult Note (Signed)
Pharmacy Antibiotic Note  Christian [name] is a 45 y.o. male admitted on 10/09/2020 with cellulitis. Pharmacy has been consulted for vancomycin dosing.  Plan: Vancomycin 2500 mg IV loading dose, followed by 1750 mg IV q12h Goal AUC 400-550  Est AUC: 518.5 Est Cmax: 31.7 Est Cmin: 16 Calculated with SCr 1.43, Vd 0.5  Monitor clinical picture, renal function, and vancomycin levels at steady state F/U C&S, abx deescalation / LOT   Height: 6\' 3"  (190.5 cm) Weight: (!) 197.3 kg (435 lb) IBW/kg (Calculated) : 84.5  Temp (24hrs), Avg:98.7 F (37.1 C), Min:98.6 F (37 C), Max:98.8 F (37.1 C)  Recent Labs  Lab 10/09/20 0811  WBC 23.2*  CREATININE 1.43*    Estimated Creatinine Clearance: 118.3 mL/min (A) (by C-G formula based on SCr of 1.43 mg/dL (H)).    No Known Allergies  Antimicrobials this admission: 8/12 ceftriaxone x 1 in ED 8/12 vancomycin >>   Dose adjustments this admission: N/A  Microbiology results: 8/12 BCx: pending 8/12 MRSA PCR: pending  Thank you for allowing pharmacy to be a part of this patient's care.  10/12, PharmD 10/09/2020 2:15 PM

## 2020-10-10 ENCOUNTER — Observation Stay: Payer: Self-pay

## 2020-10-10 DIAGNOSIS — L03311 Cellulitis of abdominal wall: Secondary | ICD-10-CM

## 2020-10-10 DIAGNOSIS — R7301 Impaired fasting glucose: Secondary | ICD-10-CM

## 2020-10-10 DIAGNOSIS — N189 Chronic kidney disease, unspecified: Secondary | ICD-10-CM

## 2020-10-10 LAB — GASTROINTESTINAL PANEL BY PCR, STOOL (REPLACES STOOL CULTURE)

## 2020-10-10 LAB — CBC
HCT: 39.6 % (ref 39.0–52.0)
Hemoglobin: 12.7 g/dL — ABNORMAL LOW (ref 13.0–17.0)
MCH: 27.1 pg (ref 26.0–34.0)
MCHC: 32.1 g/dL (ref 30.0–36.0)
MCV: 84.4 fL (ref 80.0–100.0)
Platelets: 461 10*3/uL — ABNORMAL HIGH (ref 150–400)
RBC: 4.69 MIL/uL (ref 4.22–5.81)
RDW: 13.2 % (ref 11.5–15.5)
WBC: 16.8 10*3/uL — ABNORMAL HIGH (ref 4.0–10.5)
nRBC: 0 % (ref 0.0–0.2)

## 2020-10-10 LAB — BASIC METABOLIC PANEL
Anion gap: 10 (ref 5–15)
BUN: 13 mg/dL (ref 6–20)
CO2: 27 mmol/L (ref 22–32)
Calcium: 8 mg/dL — ABNORMAL LOW (ref 8.9–10.3)
Chloride: 99 mmol/L (ref 98–111)
Creatinine, Ser: 1.13 mg/dL (ref 0.61–1.24)
GFR, Estimated: >60 mL/min (ref >60)
Glucose, Bld: 132 mg/dL — ABNORMAL HIGH (ref 70–99)
Potassium: 3.1 mmol/L — ABNORMAL LOW (ref 3.5–5.1)
Sodium: 136 mmol/L (ref 135–145)

## 2020-10-10 LAB — PROTIME-INR
INR: 1.2 (ref 0.8–1.2)
Prothrombin Time: 15.1 seconds (ref 11.4–15.2)

## 2020-10-10 LAB — CORTISOL-AM, BLOOD: Cortisol - AM: 18.9 ug/dL (ref 6.7–22.6)

## 2020-10-10 LAB — GLUCOSE, CAPILLARY
Glucose-Capillary: 146 mg/dL — ABNORMAL HIGH (ref 70–99)
Glucose-Capillary: 131 mg/dL — ABNORMAL HIGH (ref 70–99)
Glucose-Capillary: 94 mg/dL (ref 70–99)

## 2020-10-10 MED ORDER — FLUCONAZOLE 100 MG PO TABS
400.0000 mg | ORAL_TABLET | ORAL | Status: DC
  Administered 2020-10-11: 400 mg via ORAL
  Filled 2020-10-10 (×2): qty 4

## 2020-10-10 MED ORDER — POTASSIUM CHLORIDE CRYS ER 20 MEQ PO TBCR
20.0000 meq | EXTENDED_RELEASE_TABLET | Freq: Two times a day (BID) | ORAL | Status: DC
  Administered 2020-10-10 – 2020-11-30 (×100): 20 meq via ORAL
  Filled 2020-10-10 (×101): qty 1

## 2020-10-10 MED ORDER — FLUCONAZOLE 100 MG PO TABS
800.0000 mg | ORAL_TABLET | Freq: Once | ORAL | Status: AC
  Administered 2020-10-10: 800 mg via ORAL
  Filled 2020-10-10: qty 8

## 2020-10-10 NOTE — Progress Notes (Signed)
CC: panniculitis Subjective: Feeling better, no fevers kub pers reviewed, dilated colon , no free air Pt tolerating diet  Wbc down  C diff negative   Objective: Vital signs in last 24 hours: Temp:  [97.7 F (36.5 C)-98.8 F (37.1 C)] 97.9 F (36.6 C) (08/13 0911) Pulse Rate:  [92-105] 92 (08/13 0911) Resp:  [17-28] 18 (08/13 0911) BP: (129-154)/(66-109) 154/100 (08/13 0911) SpO2:  [91 %-97 %] 94 % (08/13 0911) Last BM Date: 10/09/20  Intake/Output from previous day: 08/12 0701 - 08/13 0700 In: 1589.6 [I.V.:125.6; IV Piggyback:1464] Out: 675 [Urine:675] Intake/Output this shift: No intake/output data recorded.  Physical exam:  NAD  Abd soft, panniculitis and cellulitis improving, no evidence of abscess or necrotizing infection  Lab Results: CBC  Recent Labs    10/09/20 0811 10/10/20 0525  WBC 23.2* 16.8*  HGB 13.9 12.7*  HCT 42.4 39.6  PLT 509* 461*   BMET Recent Labs    10/09/20 0811 10/10/20 0525  NA 135 136  K 3.1* 3.1*  CL 97* 99  CO2 25 27  GLUCOSE 204* 132*  BUN 13 13  CREATININE 1.43* 1.13  CALCIUM 8.3* 8.0*   PT/INR Recent Labs    10/10/20 0525  LABPROT 15.1  INR 1.2   ABG No results for input(s): PHART, HCO3 in the last 72 hours.  Invalid input(s): PCO2, PO2  Studies/Results: DG Chest 2 View  Result Date: 10/09/2020 CLINICAL DATA:  Shortness of breath and cough.  Vomiting/gagging. EXAM: CHEST - 2 VIEW COMPARISON:  09/23/2019 FINDINGS: Airway thickening is present, suggesting bronchitis or reactive airways disease. Body habitus reduces diagnostic sensitivity and specificity. No airspace opacity or blunting of the costophrenic angles. Lower thoracic spondylosis with bridging spurring anterior to the vertebral body column, as on 09/23/2019. IMPRESSION: 1. Airway thickening is present, suggesting bronchitis or reactive airways disease. 2. Lower thoracic spondylosis. Electronically Signed   By: Gaylyn Rong M.D.   On: 10/09/2020 08:51    CT Abdomen Pelvis W Contrast  Result Date: 10/09/2020 CLINICAL DATA:  Abdominal pain, nonlocalized. EXAM: CT ABDOMEN AND PELVIS WITH CONTRAST TECHNIQUE: Multidetector CT imaging of the abdomen and pelvis was performed using the standard protocol following bolus administration of intravenous contrast. CONTRAST:  OMNIPAQUE IOHEXOL 350 MG/ML SOLN COMPARISON:  None. FINDINGS: Evaluation is somewhat limited by motion artifact. Lower chest: No acute abnormality. Hepatobiliary: The liver is normal in contour. No focal hepatic lesions. No intra or extrahepatic biliary ductal dilatation. The portal vein is patent. No gallbladder wall thickening or pericholecystic fluid. No radiopaque stones. Pancreas: Mild fatty atrophy. No pancreatic ductal dilatation or surrounding inflammatory changes. Spleen: Normal in size without focal abnormality.  Splenule. Adrenals/Urinary Tract: The adrenal glands are unremarkable. The kidneys enhance symmetrically with no hydronephrosis. Hypoattenuating lesion in the left mid kidney, most likely a renal cyst. The ureters and bladder are unremarkable. Stomach/Bowel: The stomach is unremarkable. The small bowel is mildly dilated distally. The colon is dilated, measuring up to 8.8 cm near the hepatic flexure and 7.3 cm in the descending colon. No bowel wall thickening, pneumatosis, or portal venous gas. No evidence of obstruction. The appendix is unremarkable. Trace fluid in the left pericolic gutter. Vascular/Lymphatic: The aorta is normal in caliber, and the central aspect of its branch vessels are patent. Prominent retroperitoneal and mesenteric lymph nodes (series 2, image 61), measuring up to 1.1 cm in short axis. Enlarged pelvic lymph nodes (for example series 2, image 107), which appear to retain their reniform shape, likely reactive.  Enlarged inguinal lymph nodes, which measure up to 3.1 cm on the right (series 2, image 136) and 1.9 cm on the left (series 2, image 123), which  appear to retain fatty hila. Reproductive: Prostate is unremarkable. Other: No free air. Musculoskeletal: Degenerative changes in imaged spine. No acute fracture. IMPRESSION: 1. Dilated distal small bowel and colon without transition point, possibly due to ileus, although early or partial small bowel obstruction could appear similar. 2. Enlarged pelvic and inguinal lymph nodes, which are nonspecific and most likely reactive, but concerning for more distal infection, possibly in the lower extremities. Additional prominent retroperitoneal and mesenteric lymph nodes are also favored to be reactive. Electronically Signed   By: Wiliam Ke MD   On: 10/09/2020 11:27   DG ABD ACUTE 2+V W 1V CHEST  Result Date: 10/10/2020 CLINICAL DATA:  Ileus, abdominal pain and distension, sepsis. EXAM: DG ABDOMEN ACUTE WITH 1 VIEW CHEST COMPARISON:  None. FINDINGS: Single-view of the chest: Borderline cardiomegaly. Lungs are clear. No pleural effusion or pneumothorax is seen. Upright and supine views of the abdomen: Distended gas-filled loops of large bowel throughout the abdomen and pelvis. Uncertain involvement of the small bowel. No evidence of free intraperitoneal air or fluid collection. Osseous structures about the chest are unremarkable. IMPRESSION: 1. Distended gas-filled loops of large bowel throughout the abdomen and pelvis. Uncertain involvement of the small bowel. Findings suggest either ileus or distal obstruction. 2. Lungs are clear. Electronically Signed   By: Bary Richard M.D.   On: 10/10/2020 09:23    Anti-infectives: Anti-infectives (From admission, onward)    Start     Dose/Rate Route Frequency Ordered Stop   10/10/20 1000  cefTRIAXone (ROCEPHIN) 2 g in sodium chloride 0.9 % 100 mL IVPB  Status:  Discontinued        2 g 200 mL/hr over 30 Minutes Intravenous Every 24 hours 10/09/20 1236 10/09/20 1408   10/10/20 0000  vancomycin (VANCOREADY) IVPB 1750 mg/350 mL        1,750 mg 175 mL/hr over 120 Minutes  Intravenous Every 12 hours 10/09/20 1722     10/09/20 1430  vancomycin (VANCOREADY) IVPB 1500 mg/300 mL  Status:  Discontinued        1,500 mg 150 mL/hr over 120 Minutes Intravenous  Once 10/09/20 1414 10/10/20 0120   10/09/20 1145  cefTRIAXone (ROCEPHIN) 1 g in sodium chloride 0.9 % 100 mL IVPB        1 g 200 mL/hr over 30 Minutes Intravenous  Once 10/09/20 1139 10/09/20 1356   10/09/20 1145  vancomycin (VANCOCIN) IVPB 1000 mg/200 mL premix        1,000 mg 200 mL/hr over 60 Minutes Intravenous  Once 10/09/20 1139 10/09/20 1656       Assessment/Plan:  Improving panniculitis, no need for debridement, continue a/bs, pressure off loading and keeping area dry We will be available I spent > 25 min in this encounter w > 50% spent in coordination of his care  Sterling Big, MD, [facility]  10/10/2020

## 2020-10-10 NOTE — Progress Notes (Signed)
TOC consulted. Patient living in car, has wounds and uninsured. Patient will be DTP. Still being worked up by medical team. Notified TOC Supervisor of patient. TOC will follow.  Alfonso Ramus, [place] [phone]

## 2020-10-10 NOTE — Progress Notes (Signed)
Patient ID: Christian [name], male   DOB: 08-May-1975, 45 y.o.   MRN: [id number] [facility] PROGRESS NOTE  Christian [name] PJA:[id number] DOB: 1975/10/31 DOA: 10/09/2020 PCP: Pcp, No  HPI/Subjective: Patient states he has been living in his car and not moving around too much.  He developed some skin ulcerations.  He is having some lower abdominal pain.  Admitted with clinical sepsis and acute kidney injury.  Objective: Vitals:   10/10/20 0438 10/10/20 0911  BP: 130/66 (!) 154/100  Pulse: (!) 105 92  Resp: 20 18  Temp: 97.7 F (36.5 C) 97.9 F (36.6 C)  SpO2: 91% 94%    Intake/Output Summary (Last 24 hours) at 10/10/2020 1432 Last data filed at 10/10/2020 1300 Gross per 24 hour  Intake 1589.59 ml  Output 900 ml  Net 689.59 ml   Filed Weights   10/09/20 0807  Weight: (!) 197.3 kg    ROS: Review of Systems  Respiratory:  Negative for shortness of breath.   Cardiovascular:  Negative for chest pain.  Gastrointestinal:  Negative for abdominal pain, nausea and vomiting.  Exam: Physical Exam HENT:     Head: Normocephalic.     Mouth/Throat:     Pharynx: No oropharyngeal exudate.  Eyes:     General: Lids are normal.     Conjunctiva/sclera: Conjunctivae normal.     Pupils: Pupils are equal, round, and reactive to light.  Cardiovascular:     Rate and Rhythm: Normal rate and regular rhythm.     Heart sounds: Normal heart sounds, S1 normal and S2 normal.  Pulmonary:     Breath sounds: Normal breath sounds. No decreased breath sounds, wheezing, rhonchi or rales.  Abdominal:     Palpations: Abdomen is soft.     Tenderness: There is abdominal tenderness.  Musculoskeletal:     Right lower leg: Swelling present.     Left lower leg: Swelling present.  Skin:    Comments: Abdominal wall cellulitis and skin fold.  Patient has some drainage coming from there.  Neurological:     Mental Status: He is alert and oriented to person, place, and time.      Scheduled Meds:   amLODipine  5 mg Oral Daily   collagenase   Topical Daily   enoxaparin (LOVENOX) injection  0.5 mg/kg Subcutaneous Q24H   insulin aspart  0-5 Units Subcutaneous QHS   insulin aspart  0-9 Units Subcutaneous TID WC   potassium chloride  20 mEq Oral BID   Continuous Infusions:  vancomycin 1,750 mg (10/10/20 1346)    Assessment/Plan:  Clinical severe sepsis, with acute kidney injury.  Present on admission with abdominal wall purulent cellulitis.  Patient started on vancomycin.  Leukocytosis of 23.2 on presentation down to 16.8 today.  Add oral Diflucan with likely fungal contribution of abdominal wall cellulitis. Acute kidney injury on chronic kidney disease stage II.  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  Hemoglobin A1c 6.0.  Will discontinue fingersticks. Weakness.  Physical therapy evaluation Sacral decubiti.  Collagenase.  Surgical consultation. Heel decubiti.  Heel protectors while in bed History of Planter fasciitis  Pressure Injury 10/09/20 Buttocks Left Unstageable - Full thickness tissue loss in which the base of the injury is covered by slough (yellow, tan, gray, green or brown) and/or eschar (tan, brown or black) in the wound bed. (Active)  10/09/20   Location: Buttocks  Location Orientation: Left  Staging:  Unstageable - Full thickness tissue loss in which the base of the injury is covered by slough (yellow, tan, gray, green or brown) and/or eschar (tan, brown or black) in the wound bed.  Wound Description (Comments):   Present on Admission: Yes     Pressure Injury 10/09/20 Buttocks Right Unstageable - Full thickness tissue loss in which the base of the injury is covered by slough (yellow, tan, gray, green or brown) and/or eschar (tan, brown or black) in the wound bed. (Active)  10/09/20   Location: Buttocks  Location Orientation: Right  Staging: Unstageable - Full  thickness tissue loss in which the base of the injury is covered by slough (yellow, tan, gray, green or brown) and/or eschar (tan, brown or black) in the wound bed.  Wound Description (Comments):   Present on Admission: Yes     Pressure Injury 10/09/20 Heel Left Stage 2 -  Partial thickness loss of dermis presenting as a shallow open injury with a red, pink wound bed without slough. (Active)  10/09/20   Location: Heel  Location Orientation: Left  Staging: Stage 2 -  Partial thickness loss of dermis presenting as a shallow open injury with a red, pink wound bed without slough.  Wound Description (Comments):   Present on Admission: Yes     Pressure Injury 10/09/20 Heel Right Stage 2 -  Partial thickness loss of dermis presenting as a shallow open injury with a red, pink wound bed without slough. (Active)  10/09/20   Location: Heel  Location Orientation: Right  Staging: Stage 2 -  Partial thickness loss of dermis presenting as a shallow open injury with a red, pink wound bed without slough.  Wound Description (Comments):   Present on Admission: Yes     Pressure Injury Foot Right Stage 2 -  Partial thickness loss of dermis presenting as a shallow open injury with a red, pink wound bed without slough. (Active)     Location: Foot  Location Orientation: Right  Staging: Stage 2 -  Partial thickness loss of dermis presenting as a shallow open injury with a red, pink wound bed without slough.  Wound Description (Comments):   Present on Admission: Yes       Code Status:     Code Status Orders  (From admission, onward)           Start     Ordered   10/09/20 1235  Full code  Continuous        10/09/20 1236           Code Status History     This patient has a current code status but no historical code status.       Disposition Plan: Status is: Inpatient  Dispo: The patient is from: His car              Anticipated d/c is to: His car versus rehab depending on clinical  progress              Patient currently being treated for severe sepsis with IV antibiotics.   Difficult to place patient.  Yes  Consultants: General surgery  Antibiotics: Vancomycin  Time spent: 28 minutes  Brittanyann [employer]

## 2020-10-10 NOTE — Consult Note (Signed)
Pharmacy Antibiotic Note  Christian [name] is a 45 y.o. male with medical history for morbid obesity and HTN who presented on 10/09/2020 complaining of abdominal pain. Pharmacy has been consulted for oral fluconazole dosing for likely fungal contribution of abdominal wall cellulitis.  Plan: Start fluconazole 800 mg PO x1, followed by 400 mg daily Monitor renal function and adjust dose as clinically indicated Monitor LFTs    Height: 6\' 3"  (190.5 cm) Weight: (!) 197.3 kg (435 lb) IBW/kg (Calculated) : 84.5  Temp (24hrs), Avg:97.8 F (36.6 C), Min:97.7 F (36.5 C), Max:97.9 F (36.6 C)  Recent Labs  Lab 10/09/20 0811 10/09/20 1644 10/09/20 1959 10/10/20 0525  WBC 23.2*  --   --  16.8*  CREATININE 1.43*  --   --  1.13  LATICACIDVEN  --  1.7 1.1  --      Estimated Creatinine Clearance: 149.7 mL/min (by C-G formula based on SCr of 1.13 mg/dL).    No Known Allergies  Antimicrobials this admission: 8/12 ceftriaxone x 1 in ED 8/12 vancomycin >>  8/13 fluconazole >>   Dose adjustments this admission: N/A  Microbiology results: 8/12 BCx: NG <24h 8/12 MRSA PCR: pending 8/13 GI PCR: negative  Thank you for allowing pharmacy to be a part of this patient's care.  9/13, PharmD 10/10/2020 3:13 PM

## 2020-10-11 DIAGNOSIS — L89302 Pressure ulcer of unspecified buttock, stage 2: Secondary | ICD-10-CM

## 2020-10-11 LAB — HIV ANTIBODY (ROUTINE TESTING W REFLEX): HIV Screen 4th Generation wRfx: REACTIVE — AB

## 2020-10-11 LAB — VANCOMYCIN, PEAK: Vancomycin Pk: 26 ug/mL — ABNORMAL LOW (ref 30–40)

## 2020-10-11 NOTE — Progress Notes (Signed)
Patient had an uneventful shift this night. Slept through the night. Awakened minimally for vital signs and treatments. Bariatric bed with auto turn. IV/ABT ongoing without adverse effects noted. Will endorse.

## 2020-10-11 NOTE — Progress Notes (Signed)
Patient ID: Christian [name], male   DOB: [birth year], 45 y.o.   MRN: [id number] [facility] PROGRESS NOTE  Christian [name] AF[employer]:[id number] DOB: [birth year] DOA: 10/09/2020 PCP: Pcp, No  HPI/Subjective: Patient states he is doing a little bit better.  Little less drainage from his abdominal wound.  Still feeling very weak.  Not having shortness of breath.  Patient was living in his car and came in with wounds.  Objective: Vitals:   10/11/20 0336 10/11/20 0801  BP: (!) 145/83 137/83  Pulse: 89 91  Resp: 17 16  Temp: 97.9 F (36.6 C) 97.8 F (36.6 C)  SpO2: 95% 94%    Intake/Output Summary (Last 24 hours) at 10/11/2020 1342 Last data filed at 10/11/2020 1331 Gross per 24 hour  Intake 590 ml  Output 1452 ml  Net -862 ml   Filed Weights   10/09/20 0807  Weight: (!) 197.3 kg    ROS: Review of Systems  Respiratory:  Negative for shortness of breath.   Cardiovascular:  Negative for chest pain.  Gastrointestinal:  Negative for abdominal pain, nausea and vomiting.  Exam: Physical Exam HENT:     Head: Normocephalic.     Mouth/Throat:     Pharynx: No oropharyngeal exudate.  Eyes:     General: Lids are normal.     Conjunctiva/sclera: Conjunctivae normal.  Cardiovascular:     Rate and Rhythm: Normal rate and regular rhythm.     Heart sounds: Normal heart sounds, S1 normal and S2 normal.  Pulmonary:     Breath sounds: Examination of the right-lower field reveals decreased breath sounds. Examination of the left-lower field reveals decreased breath sounds. Decreased breath sounds present. No wheezing, rhonchi or rales.  Abdominal:     Palpations: Abdomen is soft.     Tenderness: There is abdominal tenderness in the suprapubic area.  Musculoskeletal:     Right lower leg: Swelling present.     Left lower leg: Swelling present.  Skin:    General: Skin is warm.     Comments: Posterior left buttock and down the left leg deep red and excoriated.  Unable to look at right leg  because of the patient's positioning. Lower abdominal wound/drainage today  Neurological:     Mental Status: He is alert and oriented to person, place, and time.     Scheduled Meds:  amLODipine  5 mg Oral Daily   collagenase   Topical Daily   enoxaparin (LOVENOX) injection  0.5 mg/kg Subcutaneous Q24H   fluconazole  400 mg Oral Q24H   potassium chloride  20 mEq Oral BID   Continuous Infusions:  vancomycin 1,750 mg (10/11/20 1218)   Brief history.  45 year old homeless male living in his car coming in with abdominal pain and draining ulcerations.  Patient very weak and can hardly move around.  Admitted with clinical sepsis.  Past medical history morbid obesity and hypertension.  Assessment/Plan:  Clinical severe sepsis with acute kidney injury.  This was present on admission.  Patient has abdominal wall purulent cellulitis and bilateral lower extremity and buttock wounds from immobility.  Leukocytosis of 23.2 on presentation.  Continue IV vancomycin.  Added oral Diflucan with likely fungal component in between the skin folds. Numerous decubiti on buttock lower back and lower legs.  See full description below Acute kidney injury on chronic kidney disease stage II.  Creatinine 1.43 on presentation down to 1.13 yesterday Morbid obesity with a BMI of 54.37 Essential hypertension on Norvasc Impaired fasting glucose.  Hemoglobin A1c  6.0.  Fingersticks discontinued Weakness.  Physical therapy recommending rehab Heel decubiti.  Heel protectors while in bed History of Planter fasciitis  Pressure Injury 10/09/20 Buttocks Left Unstageable - Full thickness tissue loss in which the base of the injury is covered by slough (yellow, tan, gray, green or brown) and/or eschar (tan, brown or black) in the wound bed. (Active)  10/09/20   Location: Buttocks  Location Orientation: Left  Staging: Unstageable - Full thickness tissue loss in which the base of the injury is covered by slough (yellow, tan,  gray, green or brown) and/or eschar (tan, brown or black) in the wound bed.  Wound Description (Comments):   Present on Admission: Yes     Pressure Injury 10/09/20 Buttocks Right Unstageable - Full thickness tissue loss in which the base of the injury is covered by slough (yellow, tan, gray, green or brown) and/or eschar (tan, brown or black) in the wound bed. (Active)  10/09/20   Location: Buttocks  Location Orientation: Right  Staging: Unstageable - Full thickness tissue loss in which the base of the injury is covered by slough (yellow, tan, gray, green or brown) and/or eschar (tan, brown or black) in the wound bed.  Wound Description (Comments):   Present on Admission: Yes     Pressure Injury 10/09/20 Heel Left Stage 2 -  Partial thickness loss of dermis presenting as a shallow open injury with a red, pink wound bed without slough. (Active)  10/09/20   Location: Heel  Location Orientation: Left  Staging: Stage 2 -  Partial thickness loss of dermis presenting as a shallow open injury with a red, pink wound bed without slough.  Wound Description (Comments):   Present on Admission: Yes     Pressure Injury 10/09/20 Heel Right Stage 2 -  Partial thickness loss of dermis presenting as a shallow open injury with a red, pink wound bed without slough. (Active)  10/09/20   Location: Heel  Location Orientation: Right  Staging: Stage 2 -  Partial thickness loss of dermis presenting as a shallow open injury with a red, pink wound bed without slough.  Wound Description (Comments):   Present on Admission: Yes     Pressure Injury Foot Right Stage 2 -  Partial thickness loss of dermis presenting as a shallow open injury with a red, pink wound bed without slough. (Active)     Location: Foot  Location Orientation: Right  Staging: Stage 2 -  Partial thickness loss of dermis presenting as a shallow open injury with a red, pink wound bed without slough.  Wound Description (Comments):   Present on  Admission: Yes       Code Status:     Code Status Orders  (From admission, onward)           Start     Ordered   10/09/20 1235  Full code  Continuous        10/09/20 1236           Code Status History     This patient has a current code status but no historical code status.      Disposition Plan: Status is: Inpatient  Dispo: The patient is from: His car              Anticipated d/c is to: To be determined.  Without insurance rehab will likely not be a possibility.              Patient currently not medically stable for disposition  requiring IV antibiotics for clinical sepsis and abdominal wound cellulitis   Difficult to place patient.  Yes.  Consultants: General surgery  Antibiotics: Vancomycin Diflucan  Time spent: 27 minutes  Kristilyn [employer]tr[employer]

## 2020-10-11 NOTE — NC FL2 (Signed)
[place] MEDICAID FL2 LEVEL OF CARE SCREENING TOOL     IDENTIFICATION  Patient Name: Christian [name] Birthdate: [birth year] Sex: male Admission Date (Current Location): 10/09/2020  [facility] and [place] Number:  Chiropodist and Address:  [facility], [address], [place], [place] [address]      Provider Number: [id number]  Attending Physician Name and Address:  Alford Highland, MD  Relative Name and Phone Number:  Ritter,Bruce Clearence Cheek)   [phone] (Home Phone)    Current Level of Care: Hospital Recommended Level of Care: Skilled Nursing Facility Prior Approval Number:    Date Approved/Denied:   PASRR Number: [id number]  Discharge Plan:      Current Diagnoses: Patient Active Problem List   Diagnosis Date Noted   Abdominal wall cellulitis 10/10/2020   Impaired fasting glucose    Severe sepsis (HCC) 10/09/2020   Hyperglycemia 10/09/2020   Acute kidney injury superimposed on CKD (HCC) 10/09/2020   Pressure ulcer of back 10/09/2020   Pressure ulcer of buttock 10/09/2020   Essential hypertension 10/09/2020   Chest pain 04/06/2011   Obesity    Elevated blood pressure reading without diagnosis of hypertension     Orientation RESPIRATION BLADDER Height & Weight     Time, Situation, Place, Self  O2 Incontinent Weight: (!) 435 lb (197.3 kg) Height:  6\' 3"  (190.5 cm)  BEHAVIORAL SYMPTOMS/MOOD NEUROLOGICAL BOWEL NUTRITION STATUS      Incontinent Diet (heart healthy/carb modified; thin liquids)  AMBULATORY STATUS COMMUNICATION OF NEEDS Skin   Extensive Assist   Other (Comment) (unstageable wounds to buttocks; stage 2 wounds to L & R heel and L & R feet)                       Personal Care Assistance Level of Assistance  Bathing, Feeding, Dressing Bathing Assistance: Maximum assistance Feeding assistance: Independent Dressing Assistance: Maximum assistance     Functional Limitations Info             SPECIAL CARE  FACTORS FREQUENCY  PT (By licensed PT), OT (By licensed OT)     PT Frequency: 5 x/week OT Frequency: 5 x/week            Contractures      Additional Factors Info  Code Status, Allergies Code Status Info: full code Allergies Info: nka           Current Medications (10/11/2020):  This is the current hospital active medication list Current Facility-Administered Medications  Medication Dose Route Frequency Provider Last Rate Last Admin   acetaminophen (TYLENOL) tablet 650 mg  650 mg Oral Q6H PRN Cox, Amy N, DO       Or   acetaminophen (TYLENOL) suppository 650 mg  650 mg Rectal Q6H PRN Cox, Amy N, DO       amLODipine (NORVASC) tablet 5 mg  5 mg Oral Daily Cox, Amy N, DO   5 mg at 10/11/20 1050   collagenase (SANTYL) ointment   Topical Daily Cox, Amy N, DO   Given at 10/11/20 1048   enoxaparin (LOVENOX) injection 97.5 mg  0.5 mg/kg Subcutaneous Q24H Cox, Amy N, DO   97.5 mg at 10/10/20 2210   fluconazole (DIFLUCAN) tablet 400 mg  400 mg Oral Q24H Rauer, Samantha O, RPH       ondansetron (ZOFRAN) tablet 4 mg  4 mg Oral Q6H PRN Cox, Amy N, DO       Or   ondansetron (ZOFRAN) injection  4 mg  4 mg Intravenous Q6H PRN Cox, Amy N, DO   4 mg at 10/10/20 0156   potassium chloride SA (KLOR-CON) CR tablet 20 mEq  20 mEq Oral BID Alford Highland, MD   20 mEq at 10/11/20 1050   vancomycin (VANCOREADY) IVPB 1750 mg/350 mL  1,750 mg Intravenous Q12H Derrek Gu, RPH 175 mL/hr at 10/11/20 1218 1,750 mg at 10/11/20 1218     Discharge Medications: Please see discharge summary for a list of discharge medications.  Relevant Imaging Results:  Relevant Lab Results:   Additional Information SS #: [id number]  Malacki Mcphearson E Keonta Monceaux, LCSW

## 2020-10-11 NOTE — Evaluation (Signed)
Occupational Therapy Evaluation Patient Details Name: Christian [name] MRN: [id number] DOB: [birth year] Today's Date: 10/11/2020    History of Present Illness Pt is a 45 y/o M who presented to ED on 10/09/20 with c/c of bilateral lower abdominal pain & c/o emesis twice that day after eating. Pt found to have abdominal wall purulent cellulitis. Pt admitted with clinical sepsis & AKI. PMH: morbid obesity, HTN   Clinical Impression   Patient presenting with decreased I in self care,balance, functional mobility/transfers, endurance, and safety awareness. Patient reports being home less and living in car since June. Pt reports having a gym membership to wash self but unable to verbalized when he last walked. Pt reports he has been using the bathroom on himself in the car because it was just easier. Patient incontinent of BM this session and needing total A for hygiene. Pt rolls with min A and use of bedrails. RN present to address wounds. Patient will benefit from acute OT to increase overall independence in the areas of ADLs, functional mobility, and safety awareness in order to safely discharge to next venue of care.     Follow Up Recommendations  SNF    Equipment Recommendations  Other (comment) (defer to next venue of care)       Precautions / Restrictions Precautions Precautions: Fall Restrictions Weight Bearing Restrictions: No      Mobility Bed Mobility Overal bed mobility: Needs Assistance Bed Mobility: Rolling Rolling: Min assist         General bed mobility comments: extra time to reposition himself in bed, use of bed rails, requires HOB elevated & pt endorses feeling somewhat lightheaded lying completely flat              Balance Overall balance assessment:  (not tested)                                         ADL either performed or assessed with clinical judgement   ADL Overall ADL's : Needs assistance/impaired                                        General ADL Comments: Pt can feed himself and perform grooming without assist. Secondary to body habitus pt unable to perform LB self care without total A. OT anticipates pt needing min A for UB self care.     Vision Patient Visual Report: No change from baseline              Pertinent Vitals/Pain Pain Assessment: Faces Faces Pain Scale: Hurts a little bit Pain Location: soreness on buttocks with therapists perform peri hygiene Pain Descriptors / Indicators: Grimacing Pain Intervention(s): Limited activity within patient's tolerance;Monitored during session;Repositioned     Hand Dominance Right   Extremity/Trunk Assessment Upper Extremity Assessment Upper Extremity Assessment: Generalized weakness   Lower Extremity Assessment Lower Extremity Assessment: Generalized weakness   Cervical / Trunk Assessment Cervical / Trunk Assessment:  (significant wounds on buttocks)   Communication Communication Communication: No difficulties   Cognition Arousal/Alertness: Awake/alert Behavior During Therapy: WFL for tasks assessed/performed;Flat affect Overall Cognitive Status: Within Functional Limits for tasks assessed  General Comments: AxOx4   General Comments  Pt on 4 L O2 via [place] and incontinent of BM            Home Living Family/patient expects to be discharged to:: Unsure                                 Additional Comments: Pt has been homeless and living in care since June      Prior Functioning/Environment          Comments: Pt reports he had occasional jobs but only for 1-2 weeks at a time. Last job was at [employer] but he could only tolerate standing for 15 minutes before becoming "winded" & needing to sit down. Pt reports he has a "bad habit of laying around". Pt also reports hx of plantar fasciitis and while living in his car it was easier to void bowels/bladder on himself than it  was to walk to a bathroom to use toilet 2/2 foot pain. Pt reports he recently joined a [employer] to have somewhere to shower but was unable to ambulate into building.        OT Problem List: Decreased strength;Decreased activity tolerance;Impaired balance (sitting and/or standing);Decreased safety awareness;Cardiopulmonary status limiting activity      OT Treatment/Interventions: Self-care/ADL training;Modalities;Balance training;Therapeutic exercise;Therapeutic activities;Energy conservation;DME and/or AE instruction;Patient/family education    OT Goals(Current goals can be found in the care plan section) Acute Rehab OT Goals Patient Stated Goal: none stated OT Goal Formulation: With patient Time For Goal Achievement: 10/25/20 Potential to Achieve Goals: Good ADL Goals Pt Will Perform Upper Body Dressing: with supervision;sitting Pt Will Perform Lower Body Dressing: with min assist;sit to/from stand Pt Will Transfer to Toilet: with mod assist Pt/caregiver will Perform Home Exercise Program: Both right and left upper extremity;With theraband;With written HEP provided;Independently;Increased strength  OT Frequency: Min 1X/week   Barriers to D/C: Decreased caregiver support  pt is homeless       Co-evaluation   Reason for Co-Treatment: For patient/therapist safety;Complexity of the patient's impairments (multi-system involvement) PT goals addressed during session: Mobility/safety with mobility;Strengthening/ROM OT goals addressed during session: ADL's and self-care;Strengthening/ROM      AM-PAC OT "6 Clicks" Daily Activity     Outcome Measure Help from another person eating meals?: None Help from another person taking care of personal grooming?: None Help from another person toileting, which includes using toliet, bedpan, or urinal?: Total Help from another person bathing (including washing, rinsing, drying)?: A Lot Help from another person to put on and taking off regular  upper body clothing?: A Lot Help from another person to put on and taking off regular lower body clothing?: Total 6 Click Score: 14   End of Session Nurse Communication: Mobility status;Other (comment) (wounds)  Activity Tolerance: Patient tolerated treatment well Patient left: in bed;with call bell/phone within reach;with nursing/sitter in room  OT Visit Diagnosis: Unsteadiness on feet (R26.81);Muscle weakness (generalized) (M62.81)                Time: 1610-9604 OT Time Calculation (min): 22 min Charges:  OT General Charges $OT Visit: 1 Visit OT Evaluation $OT Eval Moderate Complexity: [id number] [place], OTR/L , CBIS ascom [phone]  10/11/20, 1:10 PM

## 2020-10-11 NOTE — Evaluation (Signed)
Physical Therapy Evaluation Patient Details Name: Christian [name] MRN: [id number] DOB: 10/24/1975 Today's Date: 10/11/2020   History of Present Illness  Pt is a 45 y/o M who presented to ED on 10/09/20 with c/c of bilateral lower abdominal pain & c/o emesis twice that day after eating. Pt found to have abdominal wall purulent cellulitis. Pt admitted with clinical sepsis & AKI. PMH: morbid obesity, HTN  Clinical Impression  Pt seen for PT evaluation with co-tx with OT. Pt reports very sedentary lifestyle prior to admission; reports he lives in his car & soils himself vs going to use restroom as it's easier & less painful as pt with hx of plantar fasciitis. On this date pt requires min assist for rolling L<>R with extra time & cuing to complete movement & use of bed rails for activity. Pt incontinent of BM & unaware; pt requires dependent assist for peri hygiene. Pt with significant wounds on buttocks & BLE & pt left in care of nurse so she can dress wounds. Recommend STR upon d/c to maximize independence with functional mobility & reduce fall risk prior to return home.     Follow Up Recommendations SNF;Supervision/Assistance - 24 hour    Equipment Recommendations  None recommended by PT (TBD in next venue - anticipate bariatric air mattress hospital bed, bariatric RW & BSC, bariatric w/c)    Recommendations for Other Services       Precautions / Restrictions Precautions Precautions: Fall Restrictions Weight Bearing Restrictions: No      Mobility  Bed Mobility Overal bed mobility: Needs Assistance Bed Mobility: Rolling Rolling: Min assist         General bed mobility comments: extra time to reposition himself in bed, use of bed rails, requires HOB elevated & pt endorses feeling somewhat lightheaded lying completely flat    Transfers                    Ambulation/Gait                Stairs            Wheelchair Mobility    Modified Rankin (Stroke  Patients Only)       Balance Overall balance assessment:  (not tested)                                           Pertinent Vitals/Pain Pain Assessment: Faces Faces Pain Scale: Hurts a little bit Pain Location: soreness on buttocks with therapists perform peri hygiene Pain Descriptors / Indicators: Grimacing Pain Intervention(s): Monitored during session    Home Living Family/patient expects to be discharged to:: Unsure (pt homeless & living in car since end of June)                      Prior Function           Comments: Pt reports he had occasional jobs but only for 1-2 weeks at a time. Last job was at [employer] but he could only tolerate standing for 15 minutes before becoming "winded" & needing to sit down. Pt reports he has a "bad habit of laying around". Pt also reports hx of plantar fasciitis and while living in his car it was easier to void bowels/bladder on himself than it was to walk to a bathroom to use toilet 2/2 foot pain. Pt reports he recently  joined a [employer] to have somewhere to shower but was unable to ambulate into building.     Hand Dominance        Extremity/Trunk Assessment   Upper Extremity Assessment Upper Extremity Assessment: Generalized weakness    Lower Extremity Assessment Lower Extremity Assessment: Generalized weakness (discoloration distal to mid thigh, wounds on BLE (black wound on R heel), rough skin)    Cervical / Trunk Assessment Cervical / Trunk Assessment:  (significant wounds on buttocks)  Communication   Communication:  (Speaks slowly, requires time between sentences 2/2 O2 needs.)  Cognition Arousal/Alertness: Awake/alert Behavior During Therapy: WFL for tasks assessed/performed;Flat affect Overall Cognitive Status: Within Functional Limits for tasks assessed                                 General Comments: AxOx4      General Comments General comments (skin integrity, edema,  etc.): Pt on 4L/min via nasal cannula during session; pt with incontinent BM & unaware.    Exercises     Assessment/Plan    PT Assessment Patient needs continued PT services  PT Problem List Decreased strength;Decreased mobility;Obesity;Decreased activity tolerance;Cardiopulmonary status limiting activity;Decreased skin integrity;Decreased knowledge of use of DME;Decreased balance       PT Treatment Interventions DME instruction;Modalities;Therapeutic activities;Gait training;Therapeutic exercise;Patient/family education;Stair training;Balance training;Wheelchair mobility training;Functional mobility training;Neuromuscular re-education;Manual techniques    PT Goals (Current goals can be found in the Care Plan section)  Acute Rehab PT Goals Patient Stated Goal: none stated PT Goal Formulation: With patient Time For Goal Achievement: 10/25/20 Potential to Achieve Goals: Fair    Frequency Min 2X/week   Barriers to discharge Decreased caregiver support;Inaccessible home environment      Co-evaluation PT/OT/SLP Co-Evaluation/Treatment: Yes Reason for Co-Treatment: For patient/therapist safety;To address functional/ADL transfers;Complexity of the patient's impairments (multi-system involvement) PT goals addressed during session: Mobility/safety with mobility;Strengthening/ROM OT goals addressed during session: ADL's and self-care       AM-PAC PT "6 Clicks" Mobility  Outcome Measure Help needed turning from your back to your side while in a flat bed without using bedrails?: Total Help needed moving from lying on your back to sitting on the side of a flat bed without using bedrails?: Total Help needed moving to and from a bed to a chair (including a wheelchair)?: Total Help needed standing up from a chair using your arms (e.g., wheelchair or bedside chair)?: Total Help needed to walk in hospital room?: Total Help needed climbing 3-5 steps with a railing? : Total 6 Click Score:  6    End of Session Equipment Utilized During Treatment: Oxygen Activity Tolerance: Patient tolerated treatment well Patient left: in bed;with nursing/sitter in room Nurse Communication: Mobility status PT Visit Diagnosis: Muscle weakness (generalized) (M62.81);Difficulty in walking, not elsewhere classified (R26.2)    Time: 7209-4709 PT Time Calculation (min) (ACUTE ONLY): 23 min   Charges:   PT Evaluation $PT Eval High Complexity: 1 High          Aleda Grana, PT, DPT 10/11/20, 10:35 AM   Sandi Mariscal 10/11/2020, 10:33 AM

## 2020-10-11 NOTE — TOC Initial Note (Addendum)
Transition of Care [facility]) - Initial/Assessment Note    Patient Details  Name: Christian [name] MRN: [id number] Date of Birth: 1975/04/02  Transition of [facility]) CM/SW Contact:    Liliana Cline, LCSW Phone Number: 10/11/2020, 12:10 PM  Clinical Narrative:                Spoke to patient regarding discharge planning. Patient has been living in his car since June when he was evicted from his home. Patient stated he stays in his car in parking lots. He joined [employer] with the goal of showering there, however stated he got to the point that he was unable to get out of his car and go into the gym. Patient is uninsured. CSW explained PT recommendation for SNF. Explained that TOC can try to look for LOG SNF placement but that we also need to think of a back up plan because these are often hard to find. Patient verbalzied understanding. He stated he is interested in SNF for rehab. He has reached out to his church [facility] [employer] in [place]) today to see if they can help find him somewhere to live. Explained that if we are unable to find SNF, if patient can find friend, family, etc to stay with that we could set up charity DME and home health. Patient agreed to continue to reach out to friends/family/church to see if he can find someone to stay with as a back up plan for when discharged- if SNF does not work out.   CSW starting SNF workup.   Also reached out to Maryjane Hurter with Financial Counseling to screen for Medicaid.  TOC Supervisor Darl Pikes is aware of patient.      Barriers to Discharge: Continued Medical Work up   Patient Goals and CMS Choice Patient states their goals for this hospitalization and ongoing recovery are:: TBD CMS Medicare.gov Compare Post Acute Care list provided to:: Patient Choice offered to / list presented to : Patient  Expected Discharge Plan and Services         Living arrangements for the past 2 months: Homeless                                       Prior Living Arrangements/Services Living arrangements for the past 2 months: Homeless Lives with:: Self Patient language and need for interpreter reviewed:: Yes Do you feel safe going back to the place where you live?: No   homeless, living in car and unable to get out  Need for Family Participation in Patient Care: Yes (Comment) Care giver support system in place?: Yes (comment)   Criminal Activity/Legal Involvement Pertinent to Current Situation/Hospitalization: No - Comment as needed  Activities of Daily Living Home Assistive Devices/Equipment: None ADL Screening (condition at time of admission) Patient's cognitive ability adequate to safely complete daily activities?: Yes Is the patient deaf or have difficulty hearing?: No Does the patient have difficulty seeing, even when wearing glasses/contacts?: No Does the patient have difficulty concentrating, remembering, or making decisions?: No Patient able to express need for assistance with ADLs?: Yes Does the patient have difficulty dressing or bathing?: Yes Independently performs ADLs?: No Communication: Independent Dressing (OT): Needs assistance Is this a change from baseline?: Pre-admission baseline Grooming: Needs assistance Is this a change from baseline?: Pre-admission baseline Feeding: Independent Bathing: Needs assistance Is this a change from baseline?: Pre-admission baseline Toileting: Needs assistance Is  this a change from baseline?: Pre-admission baseline In/Out Bed: Needs assistance Is this a change from baseline?: Pre-admission baseline Walks in Home: Needs assistance Is this a change from baseline?: Pre-admission baseline Does the patient have difficulty walking or climbing stairs?: Yes Weakness of Legs: None Weakness of Arms/Hands: None  Permission Sought/Granted Permission sought to share information with : Facility Industrial/product designer granted to share information with :  Yes, Verbal Permission Granted     Permission granted to share info w AGENCY: SNF, ALF, church, HH, DME, other community resources to help        Emotional Assessment       Orientation: : Oriented to Self, Oriented to Place, Oriented to  Time, Oriented to Situation Alcohol / Substance Use: Not Applicable Psych Involvement: No (comment)  Admission diagnosis:  Cellulitis of abdominal wall [L03.311] Sepsis (HCC) [A41.9] Abdominal wall cellulitis [L03.311] Patient Active Problem List   Diagnosis Date Noted   Abdominal wall cellulitis 10/10/2020   Impaired fasting glucose    Severe sepsis (HCC) 10/09/2020   Hyperglycemia 10/09/2020   Acute kidney injury superimposed on CKD (HCC) 10/09/2020   Pressure ulcer of back 10/09/2020   Pressure ulcer of buttock 10/09/2020   Essential hypertension 10/09/2020   Chest pain 04/06/2011   Obesity    Elevated blood pressure reading without diagnosis of hypertension    PCP:  Pcp, No Pharmacy:   [id number] [address] [place] [address] Phone: [phone] Fax: [phone]  [facility] [address] [place] [address] Phone: [phone] Fax: [phone]     Social Determinants of Health (SDOH) Interventions    Readmission Risk Interventions Readmission Risk Prevention Plan 10/11/2020  Post Dischage Appt Complete  Medication Screening Complete  Transportation Screening Complete  Some recent data might be hidden

## 2020-10-12 LAB — BASIC METABOLIC PANEL
Anion gap: 9 (ref 5–15)
BUN: 7 mg/dL (ref 6–20)
CO2: 31 mmol/L (ref 22–32)
Calcium: 7.9 mg/dL — ABNORMAL LOW (ref 8.9–10.3)
Chloride: 98 mmol/L (ref 98–111)
Creatinine, Ser: 0.99 mg/dL (ref 0.61–1.24)
GFR, Estimated: >60 mL/min (ref >60)
Glucose, Bld: 103 mg/dL — ABNORMAL HIGH (ref 70–99)
Potassium: 3.2 mmol/L — ABNORMAL LOW (ref 3.5–5.1)
Sodium: 138 mmol/L (ref 135–145)

## 2020-10-12 LAB — CBC
HCT: 39.4 % (ref 39.0–52.0)
Hemoglobin: 12.1 g/dL — ABNORMAL LOW (ref 13.0–17.0)
MCH: 26.1 pg (ref 26.0–34.0)
MCHC: 30.7 g/dL (ref 30.0–36.0)
MCV: 85.1 fL (ref 80.0–100.0)
Platelets: 427 10*3/uL — ABNORMAL HIGH (ref 150–400)
RBC: 4.63 MIL/uL (ref 4.22–5.81)
RDW: 13.2 % (ref 11.5–15.5)
WBC: 9.2 10*3/uL (ref 4.0–10.5)
nRBC: 0 % (ref 0.0–0.2)

## 2020-10-12 LAB — VANCOMYCIN, TROUGH: Vancomycin Tr: 13 ug/mL — ABNORMAL LOW (ref 15–20)

## 2020-10-12 LAB — MRSA NEXT GEN BY PCR, NASAL: MRSA by PCR Next Gen: NOT DETECTED

## 2020-10-12 MED ORDER — FLUCONAZOLE 100 MG PO TABS
200.0000 mg | ORAL_TABLET | ORAL | Status: AC
Start: 2020-10-12 — End: 2020-10-17
  Administered 2020-10-12 – 2020-10-15 (×4): 200 mg via ORAL
  Filled 2020-10-12 (×5): qty 2

## 2020-10-12 MED ORDER — CEPHALEXIN 500 MG PO CAPS
1000.0000 mg | ORAL_CAPSULE | Freq: Three times a day (TID) | ORAL | Status: DC
  Administered 2020-10-12 – 2020-10-15 (×9): 1000 mg via ORAL
  Filled 2020-10-12 (×9): qty 2

## 2020-10-12 NOTE — Progress Notes (Signed)
Pharmacy Antibiotic Note  Christian [name] is a 45 y.o. male admitted on 10/09/2020 with sepsis and abdominal wall purulent cellulitis. Pharmacy has been consulted for vancomycin dosing.   WBC initially 23, now improved to 9. RR WNL. Remains afebrile. Scr remains stable.   Vancomycin peak level 26 and trough 13 drawn appropriately. Peak and trough resulting AUC of 471.  Plan: Continue vancomycin 1750 mg IV every 12 hours Goal AUC 400-550 -Est Cmax: 31.7 -Est Cmin: 16 Monitor WBC, temperature, and Scr daily.   Height: 6\' 3"  (190.5 cm) Weight: (!) 197.3 kg (435 lb) IBW/kg (Calculated) : 84.5  Temp (24hrs), Avg:98.8 F (37.1 C), Min:98.2 F (36.8 C), Max:99.6 F (37.6 C)  Recent Labs  Lab 10/09/20 0811 10/09/20 1644 10/09/20 1959 10/10/20 0525 10/11/20 1444 10/12/20 0105 10/12/20 0516  WBC 23.2*  --   --  16.8*  --   --  9.2  CREATININE 1.43*  --   --  1.13  --   --  0.99  LATICACIDVEN  --  1.7 1.1  --   --   --   --   VANCOTROUGH  --   --   --   --   --  13*  --   VANCOPEAK  --   --   --   --  26*  --   --     Estimated Creatinine Clearance: 170.9 mL/min (by C-G formula based on SCr of 0.99 mg/dL).    No Known Allergies  Antimicrobials this admission: 8/12 ceftriaxone x 1 in the ED 8/12 vancomycin >>   Dose adjustments this admission: None  Microbiology results: 8/12 BCx: Ngx2 days  Thank you for allowing pharmacy to be a part of this patient's care.   10/12, PharmD Pharmacy Resident  10/12/2020 10:47 AM

## 2020-10-12 NOTE — Progress Notes (Signed)
[facility]  - [place] [facility]   PATIENT NAME: Christian [name]    MR#:  [id number]  DATE OF BIRTH:  08/21/1975  SUBJECTIVE:   patient laying in bed. He is morbidly obese. Unable to do any ADL by himself.  REVIEW OF SYSTEMS:   Review of Systems  Constitutional:  Negative for chills, fever and weight loss.  HENT:  Negative for ear discharge, ear pain and nosebleeds.   Eyes:  Negative for blurred vision, pain and discharge.  Respiratory:  Negative for sputum production, shortness of breath, wheezing and stridor.   Cardiovascular:  Negative for chest pain, palpitations, orthopnea and PND.  Gastrointestinal:  Negative for abdominal pain, diarrhea, nausea and vomiting.  Genitourinary:  Negative for frequency and urgency.  Musculoskeletal:  Negative for back pain and joint pain.  Neurological:  Positive for weakness. Negative for sensory change, speech change and focal weakness.  Psychiatric/Behavioral:  Negative for depression and hallucinations. The patient is not nervous/anxious.   Tolerating Diet:yes Tolerating PT: rehab  DRUG ALLERGIES:  No Known Allergies  VITALS:  Blood pressure 139/84, pulse 93, temperature 98.7 F (37.1 C), temperature source Oral, resp. rate 16, height 6\' 3"  (1.905 m), weight (!) 197.3 kg, SpO2 94 %.  PHYSICAL EXAMINATION:   Physical Exam  GENERAL:  45 y.o.-year-old patient lying in the bed with no acute distress. Morbidly obese LUNGS: decreased breath sounds bilaterally, no wheezing, rales, rhonchi. No use of accessory muscles of respiration.  CARDIOVASCULAR: S1, S2 normal. No murmurs, rubs, or gallops.  ABDOMEN: Soft, nontender, nondistended. Bowel sounds present. No organomegaly or mass.  EXTREMITIES: as below NEUROLOGIC:non focal  PSYCHIATRIC:  patient is alert and oriented x 3.  SKIN:  Pressure Injury 10/09/20 Buttocks Left Unstageable - Full thickness tissue loss in which the base of the injury is covered by slough (yellow,  tan, gray, green or brown) and/or eschar (tan, brown or black) in the wound bed. (Active)  10/09/20   Location: Buttocks  Location Orientation: Left  Staging: Unstageable - Full thickness tissue loss in which the base of the injury is covered by slough (yellow, tan, gray, green or brown) and/or eschar (tan, brown or black) in the wound bed.  Wound Description (Comments):   Present on Admission: Yes     Pressure Injury 10/09/20 Buttocks Right Unstageable - Full thickness tissue loss in which the base of the injury is covered by slough (yellow, tan, gray, green or brown) and/or eschar (tan, brown or black) in the wound bed. (Active)  10/09/20   Location: Buttocks  Location Orientation: Right  Staging: Unstageable - Full thickness tissue loss in which the base of the injury is covered by slough (yellow, tan, gray, green or brown) and/or eschar (tan, brown or black) in the wound bed.  Wound Description (Comments):   Present on Admission: Yes     Pressure Injury 10/09/20 Heel Left Stage 2 -  Partial thickness loss of dermis presenting as a shallow open injury with a red, pink wound bed without slough. (Active)  10/09/20   Location: Heel  Location Orientation: Left  Staging: Stage 2 -  Partial thickness loss of dermis presenting as a shallow open injury with a red, pink wound bed without slough.  Wound Description (Comments):   Present on Admission: Yes     Pressure Injury 10/09/20 Heel Right Stage 2 -  Partial thickness loss of dermis presenting as a shallow open injury with a red, pink wound bed without slough. (Active)  10/09/20  Location: Heel  Location Orientation: Right  Staging: Stage 2 -  Partial thickness loss of dermis presenting as a shallow open injury with a red, pink wound bed without slough.  Wound Description (Comments):   Present on Admission: Yes     Pressure Injury Foot Right Stage 2 -  Partial thickness loss of dermis presenting as a shallow open injury with a red, pink  wound bed without slough. (Active)     Location: Foot  Location Orientation: Right  Staging: Stage 2 -  Partial thickness loss of dermis presenting as a shallow open injury with a red, pink wound bed without slough.  Wound Description (Comments):   Present on Admission: Yes         LABORATORY PANEL:  CBC Recent Labs  Lab 10/12/20 0516  WBC 9.2  HGB 12.1*  HCT 39.4  PLT 427*    Chemistries  Recent Labs  Lab 10/09/20 0811 10/10/20 0525 10/12/20 0516  NA 135   < > 138  K 3.1*   < > 3.2*  CL 97*   < > 98  CO2 25   < > 31  GLUCOSE 204*   < > 103*  BUN 13   < > 7  CREATININE 1.43*   < > 0.99  CALCIUM 8.3*   < > 7.9*  AST 24  --   --   ALT 24  --   --   ALKPHOS 60  --   --   BILITOT 1.5*  --   --    < > = values in this interval not displayed.   Cardiac Enzymes No results for input(s): TROPONINI in the last 168 hours. RADIOLOGY:  No results found. ASSESSMENT AND PLAN:   45 year old homeless male living in his car coming in with abdominal pain and draining ulcerations.  Patient very weak and can hardly move around.  Admitted with clinical sepsis.  Past medical history morbid obesity and hypertension.  severe sepsis present on admission with acute kidney injury abdominal wall wall cellulitis with bilateral lower extremity and buttock wounds appears acute on chronic -- sepsis resolved -- M RSA PCR negative. 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 --- follow wound dressing change per wound nurse  morbid obesity with suspected sleep apnea -- complicates recovery and poor prognosis  essential hypertension -- on Norvasc  generalized weakness -- PT recommends rehab  history of plantar fasciitis  Family communication :none Consults : CODE STATUS: full DVT Prophylaxis :lovenox Level of care: Med-Surg Status is: Inpatient  Remains  inpatient appropriate because:Inpatient level of care appropriate due to severity of illness  Dispo: The patient is from: Home              Anticipated d/c is to:  TBD              Patient currently is medically stable to d/c.   Difficult to place patient Yes        TOTAL TIME TAKING CARE OF THIS PATIENT: 25 minutes.  >50% time spent on counselling and coordination of care  Note: This dictation was prepared with Dragon dictation along with smaller phrase technology. Any transcriptional errors that result from this process are unintentional.  Enedina Finner M.D    [facility]   CC: Primary care physician; Pcp, No Patient ID: Nicholad Kautzman, male   DOB: 05/06/1975, 45 y.o.  MRN: [id number]

## 2020-10-13 NOTE — Progress Notes (Signed)
Occupational Therapy Treatment Patient Details Name: Christian [name] MRN: [id number] DOB: 1975/05/29 Today's Date: 10/13/2020    History of present illness Pt is a 45 y/o M who presented to ED on 10/09/20 with c/c of bilateral lower abdominal pain & c/o emesis twice that day after eating. Pt found to have abdominal wall purulent cellulitis. Pt admitted with clinical sepsis & AKI. PMH: morbid obesity, HTN   OT comments  Pt seen for skilled co-treatment with PT. Pt reports, "I have been dreading this all day". OT discussed plan to get to EOB and try standing for pt to understand expectation. Pt immediately begins to report he "need to poop". OT encouraged him to continue his efforts with bed mobility and pt needing min - mod A of 2 for trunk support and B LE assist to get to EOB. Pt seated EOB for ~ 2-3 minutes and asking to return to supine while seated secondary to feeling SOB. Pt is on RA, secondary to O2 line not reaching, with O2 saturation at 90% and HR increasing to 120's with movement. Pt with posterior bias in sitting and requesting to return to supine. Mod A of 2 for repositioning in bed. Pt no longer mentioned need for BM. OT discussed need for bariatric recliner in room and recommended use of hoyer to get pt out of bed.    Follow Up Recommendations  SNF    Equipment Recommendations  Other (comment) (defer to next venue of care)       Precautions / Restrictions Precautions Precautions: Fall Restrictions Weight Bearing Restrictions: No       Mobility Bed Mobility Overal bed mobility: Needs Assistance Bed Mobility: Rolling Rolling: Min assist;Mod assist;+2 for physical assistance         General bed mobility comments: min-modA +2 for LE management and trunk support    Transfers Overall transfer level: Needs assistance               General transfer comment: Unable to progress past seated EOB due to pt c/o SOB.    Balance Overall balance assessment: Needs  assistance Sitting-balance support: Bilateral upper extremity supported;Feet supported;Feet unsupported Sitting balance-[name]: Poor Sitting balance - Comments: Required modA+2 to maintain upright Postural control: Posterior lean     Standing balance comment: Deferred                           ADL either performed or assessed with clinical judgement   ADL Overall ADL's : Needs assistance/impaired                                       General ADL Comments: total A to don B socks secondary to body habitus     Vision Patient Visual Report: No change from baseline            Cognition Arousal/Alertness: Awake/alert Behavior During Therapy: WFL for tasks assessed/performed;Flat affect Overall Cognitive Status: Within Functional Limits for tasks assessed                                 General Comments: AxOx4              General Comments Pt on 2 L O2 via [place]. Removed [place] to RA. Satting at 90 on RA seated EOB.  Pertinent Vitals/ Pain       Pain Assessment: Faces Faces Pain Scale: Hurts a little bit Pain Location: wounds on buttocks and post LE's Pain Descriptors / Indicators: Grimacing Pain Intervention(s): Limited activity within patient's tolerance;Repositioned;Monitored during session         Frequency  Min 1X/week        Progress Toward Goals  OT Goals(current goals can now be found in the care plan section)  Progress towards OT goals: Progressing toward goals  Acute Rehab OT Goals Patient Stated Goal: none stated OT Goal Formulation: With patient Time For Goal Achievement: 10/25/20 Potential to Achieve Goals: Good  Plan Discharge plan remains appropriate;Frequency remains appropriate    Co-evaluation    PT/OT/SLP Co-Evaluation/Treatment: Yes Reason for Co-Treatment: Complexity of the patient's impairments (multi-system involvement);For patient/therapist safety;To address functional/ADL transfers PT  goals addressed during session: Mobility/safety with mobility;Strengthening/ROM OT goals addressed during session: ADL's and self-care;Strengthening/ROM      AM-PAC OT "6 Clicks" Daily Activity     Outcome Measure   Help from another person eating meals?: None Help from another person taking care of personal grooming?: None Help from another person toileting, which includes using toliet, bedpan, or urinal?: Total Help from another person bathing (including washing, rinsing, drying)?: A Lot Help from another person to put on and taking off regular upper body clothing?: A Lot Help from another person to put on and taking off regular lower body clothing?: Total 6 Click Score: 14    End of Session    OT Visit Diagnosis: Unsteadiness on feet (R26.81);Muscle weakness (generalized) (M62.81)   Activity Tolerance Patient tolerated treatment well   Patient Left in bed;with call bell/phone within reach;with nursing/sitter in room   Nurse Communication Mobility status        Time: 4268-3419 OT Time Calculation (min): 20 min  Charges: OT General Charges $OT Visit: 1 Visit OT Treatments $Therapeutic Activity: 8-22 mins  Jackquline Denmark, MS, OTR/L , CBIS ascom [phone]  10/13/20, 4:05 PM

## 2020-10-13 NOTE — Progress Notes (Signed)
[facility]  - [place] [facility]   PATIENT NAME: Christian [name]    MR#:  [id number]  DATE OF BIRTH:  1975/04/25  SUBJECTIVE:   patient laying in bed. He is morbidly obese.  Eating well No family REVIEW OF SYSTEMS:   Review of Systems  Constitutional:  Negative for chills, fever and weight loss.  HENT:  Negative for ear discharge, ear pain and nosebleeds.   Eyes:  Negative for blurred vision, pain and discharge.  Respiratory:  Negative for sputum production, shortness of breath, wheezing and stridor.   Cardiovascular:  Negative for chest pain, palpitations, orthopnea and PND.  Gastrointestinal:  Negative for abdominal pain, diarrhea, nausea and vomiting.  Genitourinary:  Negative for frequency and urgency.  Musculoskeletal:  Negative for back pain and joint pain.  Neurological:  Positive for weakness. Negative for sensory change, speech change and focal weakness.  Psychiatric/Behavioral:  Negative for depression and hallucinations. The patient is not nervous/anxious.   Tolerating Diet:yes Tolerating PT: rehab  DRUG ALLERGIES:  No Known Allergies  VITALS:  Blood pressure 140/86, pulse 93, temperature 98.1 F (36.7 C), temperature source Oral, resp. rate 15, height 6\' 3"  (1.905 m), weight (!) 197.3 kg, SpO2 94 %.  PHYSICAL EXAMINATION:   Physical Exam  GENERAL:  45 y.o.-year-old patient lying in the bed with no acute distress. Morbidly obese LUNGS: decreased breath sounds bilaterally, no wheezing, rales, rhonchi. No use of accessory muscles of respiration.  CARDIOVASCULAR: S1, S2 normal. No murmurs, rubs, or gallops.  ABDOMEN: Soft, nontender, nondistended. Bowel sounds present. No organomegaly or mass.  EXTREMITIES: as below NEUROLOGIC:non focal  PSYCHIATRIC:  patient is alert and oriented x 3.  SKIN:  Pressure Injury 10/09/20 Buttocks Left Unstageable - Full thickness tissue loss in which the base of the injury is covered by slough (yellow, tan, gray,  green or brown) and/or eschar (tan, brown or black) in the wound bed. (Active)  10/09/20   Location: Buttocks  Location Orientation: Left  Staging: Unstageable - Full thickness tissue loss in which the base of the injury is covered by slough (yellow, tan, gray, green or brown) and/or eschar (tan, brown or black) in the wound bed.  Wound Description (Comments):   Present on Admission: Yes     Pressure Injury 10/09/20 Buttocks Right Unstageable - Full thickness tissue loss in which the base of the injury is covered by slough (yellow, tan, gray, green or brown) and/or eschar (tan, brown or black) in the wound bed. (Active)  10/09/20   Location: Buttocks  Location Orientation: Right  Staging: Unstageable - Full thickness tissue loss in which the base of the injury is covered by slough (yellow, tan, gray, green or brown) and/or eschar (tan, brown or black) in the wound bed.  Wound Description (Comments):   Present on Admission: Yes     Pressure Injury 10/09/20 Heel Left Stage 2 -  Partial thickness loss of dermis presenting as a shallow open injury with a red, pink wound bed without slough. (Active)  10/09/20   Location: Heel  Location Orientation: Left  Staging: Stage 2 -  Partial thickness loss of dermis presenting as a shallow open injury with a red, pink wound bed without slough.  Wound Description (Comments):   Present on Admission: Yes     Pressure Injury 10/09/20 Heel Right Stage 2 -  Partial thickness loss of dermis presenting as a shallow open injury with a red, pink wound bed without slough. (Active)  10/09/20   Location:  Heel  Location Orientation: Right  Staging: Stage 2 -  Partial thickness loss of dermis presenting as a shallow open injury with a red, pink wound bed without slough.  Wound Description (Comments):   Present on Admission: Yes     Pressure Injury Foot Right Stage 2 -  Partial thickness loss of dermis presenting as a shallow open injury with a red, pink wound bed  without slough. (Active)     Location: Foot  Location Orientation: Right  Staging: Stage 2 -  Partial thickness loss of dermis presenting as a shallow open injury with a red, pink wound bed without slough.  Wound Description (Comments):   Present on Admission: Yes         LABORATORY PANEL:  CBC Recent Labs  Lab 10/12/20 0516  WBC 9.2  HGB 12.1*  HCT 39.4  PLT 427*     Chemistries  Recent Labs  Lab 10/09/20 0811 10/10/20 0525 10/12/20 0516  NA 135   < > 138  K 3.1*   < > 3.2*  CL 97*   < > 98  CO2 25   < > 31  GLUCOSE 204*   < > 103*  BUN 13   < > 7  CREATININE 1.43*   < > 0.99  CALCIUM 8.3*   < > 7.9*  AST 24  --   --   ALT 24  --   --   ALKPHOS 60  --   --   BILITOT 1.5*  --   --    < > = values in this interval not displayed.    Cardiac Enzymes No results for input(s): TROPONINI in the last 168 hours. RADIOLOGY:  No results found. ASSESSMENT AND PLAN:   45 year old homeless male living in his car coming in with abdominal pain and draining ulcerations.  Patient very weak and can hardly move around.  Admitted with clinical sepsis.  Past medical history morbid obesity and hypertension.  severe sepsis present on admission with acute kidney injury abdominal wall wall cellulitis with bilateral lower extremity and buttock wounds appears acute on chronic -- sepsis resolved -- M RSA PCR negative. 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 Explained to patient given his chronic morbid obesity he will have to help himself will to get better in working  with physical therapy and occupational therapy. Given instructions regarding trying to use bedside commode. Avoid condom catheter/Foley. -- patient did voice understanding -- above message was relayed to nursing staff and discussed with PT OT regarding working as a team together to help patient improve and hopefully work on discharge plan  history of plantar fasciitis  Family communication :none Consults : CODE STATUS: full DVT Prophylaxis :lovenox Level of care: Med-Surg Status is: Inpatient  Remains inpatient appropriate because:Inpatient level of care appropriate due to severity of illness  Dispo: The patient is from: Home              Anticipated d/c is to:  TBD              Patient currently is medically stable to d/c.  Difficult to place patient Yes        TOTAL TIME TAKING CARE OF THIS PATIENT: 25 minutes.  >50% time spent on counselling and coordination of care  Note: This dictation was prepared with Dragon dictation along with smaller phrase technology. Any transcriptional errors that result from this process are unintentional.  Enedina Finner M.D    [facility]   CC: Primary care physician; Pcp, No Patient ID: Christian [name], male   DOB: [birth year], 45 y.o.   MRN: [id number]

## 2020-10-13 NOTE — Progress Notes (Signed)
Physical Therapy Treatment Patient Details Name: Orvel Cutsforth MRN: [id number] DOB: [birth year] Today's Date: 10/13/2020    History of Present Illness Pt is a 45 y/o M who presented to ED on 10/09/20 with c/c of bilateral lower abdominal pain & c/o emesis twice that day after eating. Pt found to have abdominal wall purulent cellulitis. Pt admitted with clinical sepsis & AKI. PMH: morbid obesity, HTN    PT Comments    Pt received supine in bed. Agreeable to PT services but reports to OT he has been "dreading this all day".  Resting on 2 L/min. Pt requiring modA+2 to go supine to rolling to seated EOB primarily for LE management and multimodal cuing for hand placement with use of HOB elevated, bed rails, and +2 modA at shoulders to sit upright. Significant post lean noted in sitting requiring modA+2 or MaxA+1 to maintain despite BUE supporting pt on bed. Trialed RA while seated due to no O2 extension. Satting at 90% on RA. Pt began wheezing and reports difficulty with breathing after sitting upright ~2 min requesting to lay back down to standing attempts unsuccessful. MaxA+2 to return to supine in bed and total assist with bed in trendelenburg with pt minimally pushing with LE's to scoot up towards HOB. Pt returned to supine with head upright to pt's tolerance/comfort. RN and medical team notified on need of hoyer sling, hoyer lift, and bariatric recliner to safely transfer pt. D/c recs remains appropriate at this time to optimize independence with functional mobility.    Follow Up Recommendations  SNF;Supervision/Assistance - 24 hour     Equipment Recommendations  None recommended by PT    Recommendations for Other Services       Precautions / Restrictions Precautions Precautions: Fall Restrictions Weight Bearing Restrictions: No    Mobility  Bed Mobility Overal bed mobility: Needs Assistance Bed Mobility: Rolling Rolling: Min assist;Mod assist;+2 for physical assistance          General bed mobility comments: min-modA +2 for LE management. Patient Response: Flat affect  Transfers Overall transfer level: Needs assistance               General transfer comment: Unable to progress past seated EOB due to pt c/o SOB.  Ambulation/Gait             General Gait Details: Deferred   Stairs             Wheelchair Mobility    Modified Rankin (Stroke Patients Only)       Balance Overall balance assessment: Needs assistance Sitting-balance support: Bilateral upper extremity supported;Feet supported;Feet unsupported Sitting balance-[name]: Poor Sitting balance - Comments: Required modA+2 to maintain upright Postural control: Posterior lean     Standing balance comment: Deferred                            Cognition   Behavior During Therapy: WFL for tasks assessed/performed;Flat affect Overall Cognitive Status: Within Functional Limits for tasks assessed                                        Exercises      General Comments General comments (skin integrity, edema, etc.): Pt on 2 L O2 via [place]. Removed [place] to RA. Satting at 90 on RA seated EOB.      Pertinent Vitals/Pain Pain Assessment: Faces  Faces Pain Scale: Hurts a little bit Pain Location: wounds on buttocks and post LE's Pain Descriptors / Indicators: Grimacing Pain Intervention(s): Limited activity within patient's tolerance;Monitored during session;Repositioned    Home Living                      Prior Function            PT Goals (current goals can now be found in the care plan section) Acute Rehab PT Goals Patient Stated Goal: none stated Time For Goal Achievement: 10/25/20 Potential to Achieve Goals: Fair Progress towards PT goals: Progressing toward goals    Frequency    Min 2X/week      PT Plan Current plan remains appropriate    Co-evaluation PT/OT/SLP Co-Evaluation/Treatment: Yes Reason for Co-Treatment: For  patient/therapist safety;Complexity of the patient's impairments (multi-system involvement) PT goals addressed during session: Mobility/safety with mobility;Strengthening/ROM OT goals addressed during session: ADL's and self-care;Strengthening/ROM      AM-PAC PT "6 Clicks" Mobility   Outcome Measure  Help needed turning from your back to your side while in a flat bed without using bedrails?: Total Help needed moving from lying on your back to sitting on the side of a flat bed without using bedrails?: Total Help needed moving to and from a bed to a chair (including a wheelchair)?: Total Help needed standing up from a chair using your arms (e.g., wheelchair or bedside chair)?: Total Help needed to walk in hospital room?: Total Help needed climbing 3-5 steps with a railing? : Total 6 Click Score: 6    End of Session Equipment Utilized During Treatment: Oxygen Activity Tolerance: Patient tolerated treatment well Patient left: in bed;with call bell/phone within reach Nurse Communication: Mobility status PT Visit Diagnosis: Muscle weakness (generalized) (M62.81);Difficulty in walking, not elsewhere classified (R26.2)     Time: 2979-8921 PT Time Calculation (min) (ACUTE ONLY): 16 min  Charges:  $Therapeutic Activity: 8-22 mins                     Delphia Grates. Fairly IV, PT, DPT Physical Therapist- [place]  [facility]  10/13/2020, 3:51 PM

## 2020-10-14 LAB — HIV-1/2 AB - DIFFERENTIATION
HIV 1 Ab: NONREACTIVE
HIV 2 Ab: NONREACTIVE
Note: NEGATIVE

## 2020-10-14 LAB — CULTURE, BLOOD (ROUTINE X 2)
Culture: NO GROWTH
Culture: NO GROWTH
Special Requests: ADEQUATE

## 2020-10-14 LAB — HIV-1/HIV-2 QUALITATIVE RNA
Final Interpretation: NEGATIVE
HIV-1 RNA, Qualitative: NONREACTIVE
HIV-2 RNA, Qualitative: NONREACTIVE

## 2020-10-14 LAB — POTASSIUM: Potassium: 3.6 mmol/L (ref 3.5–5.1)

## 2020-10-14 LAB — HIV-1/HIV-2 QUAL RNA
HIV-1 RNA, Qualitative: NONREACTIVE
HIV-2 RNA, Qualitative: NONREACTIVE

## 2020-10-14 NOTE — TOC Progression Note (Signed)
Transition of Care [facility]) - Progression Note    Patient Details  Name: Christian [name] MRN: [id number] Date of Birth: 01/17/1976  Transition of Care [facility]) CM/SW Contact  Chapman Fitch, RN Phone Number: 10/14/2020, 1:52 PM  Clinical Narrative:      Per patient in May he was still able to ambulate. States at that time he was hired for a [employer]. But he was not able to perform the task, and let go. States that he was living at a boarding house, but since June has been living in his car. States that he is drawing unemployment but only  has 3 weeks left and earns $121 per week  Patient states that he joined the [employer] to be able to use the shower, but was so weak he wan unable to get out of the car, and urinated and defecated on himself in the car.  Was driving to the drive thru to obtain food.   Patient currently requiring acute O2. Requires hoyer lift.   Patient to reach out to his friends [place] and sarah, and pastor Bruce to see if it is an option to discharge to their home    Barriers to Discharge: Continued Medical Work up  Expected Discharge Plan and Services         Living arrangements for the past 2 months: Homeless                                       Social Determinants of Health (SDOH) Interventions    Readmission Risk Interventions Readmission Risk Prevention Plan 10/11/2020  Post Dischage Appt Complete  Medication Screening Complete  Transportation Screening Complete  Some recent data might be hidden

## 2020-10-14 NOTE — Progress Notes (Signed)
[facility]  - [place] [facility]   PATIENT NAME: Christian [name]    MR#:  [id number]  DATE OF BIRTH:  10-22-1975  SUBJECTIVE:   patient laying in bed. He is morbidly obese.  Eating well No family REVIEW OF SYSTEMS:   Review of Systems  Constitutional:  Negative for chills, fever and weight loss.  HENT:  Negative for ear discharge, ear pain and nosebleeds.   Eyes:  Negative for blurred vision, pain and discharge.  Respiratory:  Negative for sputum production, shortness of breath, wheezing and stridor.   Cardiovascular:  Negative for chest pain, palpitations, orthopnea and PND.  Gastrointestinal:  Negative for abdominal pain, diarrhea, nausea and vomiting.  Genitourinary:  Negative for frequency and urgency.  Musculoskeletal:  Negative for back pain and joint pain.  Neurological:  Positive for weakness. Negative for sensory change, speech change and focal weakness.  Psychiatric/Behavioral:  Negative for depression and hallucinations. The patient is not nervous/anxious.   Tolerating Diet:yes Tolerating PT: rehab-- no payor source  DRUG ALLERGIES:  No Known Allergies  VITALS:  Blood pressure (!) 144/87, pulse 100, temperature 98.3 F (36.8 C), temperature source Oral, resp. rate 18, height 6\' 3"  (1.905 m), weight (!) 197.3 kg, SpO2 93 %.  PHYSICAL EXAMINATION:   Physical Exam  GENERAL:  45 y.o.-year-old patient lying in the bed with no acute distress. Morbidly obese LUNGS: decreased breath sounds bilaterally, no wheezing, rales, rhonchi. No use of accessory muscles of respiration.  CARDIOVASCULAR: S1, S2 normal. No murmurs, rubs, or gallops.  ABDOMEN: Soft, nontender, nondistended. Bowel sounds present. No organomegaly or mass.  EXTREMITIES: as below NEUROLOGIC:non focal  PSYCHIATRIC:  patient is alert and oriented x 3.  SKIN:  Pressure Injury 10/09/20 Buttocks Left Unstageable - Full thickness tissue loss in which the base of the injury is covered by slough  (yellow, tan, gray, green or brown) and/or eschar (tan, brown or black) in the wound bed. (Active)  10/09/20   Location: Buttocks  Location Orientation: Left  Staging: Unstageable - Full thickness tissue loss in which the base of the injury is covered by slough (yellow, tan, gray, green or brown) and/or eschar (tan, brown or black) in the wound bed.  Wound Description (Comments):   Present on Admission: Yes     Pressure Injury 10/09/20 Buttocks Right Unstageable - Full thickness tissue loss in which the base of the injury is covered by slough (yellow, tan, gray, green or brown) and/or eschar (tan, brown or black) in the wound bed. (Active)  10/09/20   Location: Buttocks  Location Orientation: Right  Staging: Unstageable - Full thickness tissue loss in which the base of the injury is covered by slough (yellow, tan, gray, green or brown) and/or eschar (tan, brown or black) in the wound bed.  Wound Description (Comments):   Present on Admission: Yes     Pressure Injury 10/09/20 Heel Left Stage 2 -  Partial thickness loss of dermis presenting as a shallow open injury with a red, pink wound bed without slough. (Active)  10/09/20   Location: Heel  Location Orientation: Left  Staging: Stage 2 -  Partial thickness loss of dermis presenting as a shallow open injury with a red, pink wound bed without slough.  Wound Description (Comments):   Present on Admission: Yes     Pressure Injury 10/09/20 Heel Right Stage 2 -  Partial thickness loss of dermis presenting as a shallow open injury with a red, pink wound bed without slough. (Active)  10/09/20   Location: Heel  Location Orientation: Right  Staging: Stage 2 -  Partial thickness loss of dermis presenting as a shallow open injury with a red, pink wound bed without slough.  Wound Description (Comments):   Present on Admission: Yes     Pressure Injury Foot Right Stage 2 -  Partial thickness loss of dermis presenting as a shallow open injury with a  red, pink wound bed without slough. (Active)     Location: Foot  Location Orientation: Right  Staging: Stage 2 -  Partial thickness loss of dermis presenting as a shallow open injury with a red, pink wound bed without slough.  Wound Description (Comments):   Present on Admission: Yes         LABORATORY PANEL:  CBC Recent Labs  Lab 10/12/20 0516  WBC 9.2  HGB 12.1*  HCT 39.4  PLT 427*     Chemistries  Recent Labs  Lab 10/09/20 0811 10/10/20 0525 10/12/20 0516 10/14/20 0434  NA 135   < > 138  --   K 3.1*   < > 3.2* 3.6  CL 97*   < > 98  --   CO2 25   < > 31  --   GLUCOSE 204*   < > 103*  --   BUN 13   < > 7  --   CREATININE 1.43*   < > 0.99  --   CALCIUM 8.3*   < > 7.9*  --   AST 24  --   --   --   ALT 24  --   --   --   ALKPHOS 60  --   --   --   BILITOT 1.5*  --   --   --    < > = values in this interval not displayed.    Cardiac Enzymes No results for input(s): TROPONINI in the last 168 hours. RADIOLOGY:  No results found. ASSESSMENT AND PLAN:   45 year old homeless male living in his car coming in with abdominal pain and draining ulcerations.  Patient very weak and can hardly move around.  Admitted with clinical sepsis.  Past medical history morbid obesity and hypertension.  severe sepsis present on admission with acute kidney injury abdominal wall wall cellulitis with bilateral lower extremity and buttock wounds appears acute on chronic -- sepsis resolved -- M RSA PCR negative. -- blood culture negative in three days -- white count normalized -- change to oral Keflex and oral Diflucan--abxs has stop date already  chronic decubitus/pressure sores present on admission --- follow wound dressing change per wound nurse  morbid obesity with suspected sleep apnea -- complicates recovery and poor prognosis  essential hypertension -- on Norvasc  generalized weakness -- PT recommends rehab --8/16-- discussed with patient at length along with RN in  the room. Explained to patient given his chronic morbid obesity he will have to help himself will to get better in working with physical therapy and occupational therapy. Given instructions regarding trying to use bedside commode. Avoid condom catheter/Foley. -- patient did voice understanding -- above message was relayed to nursing staff and discussed with PT OT regarding working as a team together to help patient improve and hopefully work on discharge plan  history of plantar fasciitis  Family communication :none Consults : CODE STATUS: full DVT Prophylaxis :lovenox Level of care: Med-Surg Status is: Inpatient  Remains inpatient appropriate because:Inpatient level of care appropriate due to severity of illness  Dispo: The patient  is from: Home              Anticipated d/c is to:  TBD              Patient currently is medically stable to d/c.   Difficult to place patient Yes        TOTAL TIME TAKING CARE OF THIS PATIENT: 20 minutes.  >50% time spent on counselling and coordination of care  Note: This dictation was prepared with Dragon dictation along with smaller phrase technology. Any transcriptional errors that result from this process are unintentional.  Enedina Finner M.D    [facility]   CC: Primary care physician; Pcp, No Patient ID: Christian [name], male   DOB: [birth year], 45 y.o.   MRN: [id number]

## 2020-10-14 NOTE — Progress Notes (Signed)
False positive HIV preliminary test Confirmatory test and RNA negative So no need for any furtehr intervention He does not have HIV

## 2020-10-14 NOTE — TOC Progression Note (Signed)
Transition of Care [facility]) - Progression Note    Patient Details  Name: Christian [name] MRN: [id number] Date of Birth: 07-08-1975  Transition of Care [facility]) CM/SW Contact  Chapman Fitch, RN Phone Number: 10/14/2020, 10:53 AM  Clinical Narrative:     No bed offers.  Reached out to [place] [facility] to review.  They are unable to offer  MD request for 1) all meals out of bed to chair 2) if PT and OT can see him (if possible) daily for now. 3) NO bed pan or Condom catheter to be used   PT, OT, and bedside RN aware    Barriers to Discharge: Continued Medical Work up  Expected Discharge Plan and Services         Living arrangements for the past 2 months: Homeless                                       Social Determinants of Health (SDOH) Interventions    Readmission Risk Interventions Readmission Risk Prevention Plan 10/11/2020  Post Dischage Appt Complete  Medication Screening Complete  Transportation Screening Complete  Some recent data might be hidden

## 2020-10-14 NOTE — Progress Notes (Signed)
Physical Therapy Treatment Patient Details Name: Caulder Wehner MRN: [id number] DOB: [birth year] Today's Date: 10/14/2020    History of Present Illness Pt is a 45 y/o M who presented to ED on 10/09/20 with c/c of bilateral lower abdominal pain & c/o emesis twice that day after eating. Pt found to have abdominal wall purulent cellulitis. Pt admitted with clinical sepsis & AKI. PMH: morbid obesity, HTN    PT Comments    Pt alert, cooperative during treatment. Pt performed rolling x 4 w min-A for nursing to change sacral wound pad. Pt is able to reach and grab bed rails while using LE for 3/4 roll. Min-A for roll completion. Pt is able to scoot toward [facility] with min-A in a semi-bridge position,bed rails. A hoyer lift was setup for bed > recliner transfer. Pt denied increased pain and was able to extend BLE with min-A while seated in [place] lift. Full transfer bed > recliner was unable to be completed dur to equipment height alignment issues (bariatric bed does not lower enough to allow full WB in [place] prior to being transferred OOB. Discharge recommendations remain SNF. Skilled PT intervention is indicated to address deficits in function, mobility, and to return to PLOF as able.    Follow Up Recommendations  SNF;Supervision/Assistance - 24 hour     Equipment Recommendations  None recommended by PT    Recommendations for Other Services       Precautions / Restrictions Precautions Precautions: Fall Restrictions Weight Bearing Restrictions: No    Mobility  Bed Mobility Overal bed mobility: Needs Assistance Bed Mobility: Rolling Rolling: Min assist         General bed mobility comments: Pt is able to utilize bedrails, reach, and LE to perform a 3/4 roll. Physical assist is necessary for full roll. Pt is able to scoot to [facility] with min-A using BLE.    Transfers Overall transfer level: Needs assistance               General transfer comment: Full transfer from bed > recliner was  not completed due to equipment height misalignment (bariatric bed does not lower to allow full WB in the [facility] lift).  Ambulation/Gait             General Gait Details: Deferred   Stairs             Wheelchair Mobility    Modified Rankin (Stroke Patients Only)       Balance       Sitting balance - Comments: Treatment was performed in supine or upright w/Hoyer lift       Standing balance comment: Treatment was performed in supine or upright w/Hoyer lift                            Cognition Arousal/Alertness: Awake/alert Behavior During Therapy: WFL for tasks assessed/performed Overall Cognitive Status: Within Functional Limits for tasks assessed                                        Exercises Other Exercises Other Exercises: Pt performed rolling to both sides w/ min-A to complete turn x 4 w/ nursing changing sacral wound pad Other Exercises: Semi-bridge/ scooting > HOB x 2, min-A and bed rails    General Comments        Pertinent Vitals/Pain Pain Assessment: Faces Faces Pain  Scale: Hurts a little bit Pain Location: Sacral wound Pain Descriptors / Indicators: Grimacing Pain Intervention(s): Limited activity within patient's tolerance;Monitored during session;Repositioned    Home Living                      Prior Function            PT Goals (current goals can now be found in the care plan section) Acute Rehab PT Goals Patient Stated Goal: none stated PT Goal Formulation: With patient Time For Goal Achievement: 10/25/20 Potential to Achieve Goals: Fair Progress towards PT goals: Progressing toward goals    Frequency    Min 2X/week      PT Plan Current plan remains appropriate    Co-evaluation              AM-PAC PT "6 Clicks" Mobility   Outcome Measure  Help needed turning from your back to your side while in a flat bed without using bedrails?: A Lot Help needed moving from lying on your  back to sitting on the side of a flat bed without using bedrails?: A Lot Help needed moving to and from a bed to a chair (including a wheelchair)?: Total Help needed standing up from a chair using your arms (e.g., wheelchair or bedside chair)?: Total Help needed to walk in hospital room?: Total Help needed climbing 3-5 steps with a railing? : Total 6 Click Score: 8    End of Session Equipment Utilized During Treatment: Oxygen Activity Tolerance: Patient tolerated treatment well Patient left: in bed;with call bell/phone within reach Nurse Communication: Mobility status PT Visit Diagnosis: Muscle weakness (generalized) (M62.81);Difficulty in walking, not elsewhere classified (R26.2)     Time: 2353-6144 PT Time Calculation (min) (ACUTE ONLY): 38 min  Charges:                        [employer], SPT

## 2020-10-15 ENCOUNTER — Inpatient Hospital Stay: Payer: Self-pay

## 2020-10-15 ENCOUNTER — Inpatient Hospital Stay: Payer: Self-pay | Admitting: Certified Registered"

## 2020-10-15 ENCOUNTER — Encounter: Payer: Self-pay | Admitting: Internal Medicine

## 2020-10-15 ENCOUNTER — Encounter
Admission: EM | Disposition: A | Discharge status: Home / Self Care | Payer: Self-pay | Source: Home / Self Care | Attending: Internal Medicine

## 2020-10-15 DIAGNOSIS — L89300 Pressure ulcer of unspecified buttock, unstageable: Secondary | ICD-10-CM

## 2020-10-15 DIAGNOSIS — J9601 Acute respiratory failure with hypoxia: Secondary | ICD-10-CM

## 2020-10-15 DIAGNOSIS — R531 Weakness: Secondary | ICD-10-CM

## 2020-10-15 SURGERY — COLECTOMY, PARTIAL, ROBOT-ASSISTED, LAPAROSCOPIC
Anesthesia: General

## 2020-10-15 MED ORDER — LIDOCAINE HCL (CARDIAC) PF 100 MG/5ML IV SOSY
PREFILLED_SYRINGE | INTRAVENOUS | Status: DC | PRN
  Administered 2020-10-15: 100 mg via INTRAVENOUS

## 2020-10-15 MED ORDER — BUDESONIDE 0.5 MG/2ML IN SUSP
0.5000 mg | Freq: Two times a day (BID) | RESPIRATORY_TRACT | Status: DC
  Administered 2020-10-15 – 2020-10-16 (×2): 0.5 mg via RESPIRATORY_TRACT
  Filled 2020-10-15 (×3): qty 2

## 2020-10-15 MED ORDER — FENTANYL CITRATE (PF) 250 MCG/5ML IJ SOLN
INTRAMUSCULAR | Status: AC
  Filled 2020-10-15: qty 5

## 2020-10-15 MED ORDER — VASOPRESSIN 20 UNIT/ML IV SOLN
INTRAVENOUS | Status: AC
  Filled 2020-10-15: qty 1

## 2020-10-15 MED ORDER — SUCCINYLCHOLINE CHLORIDE 200 MG/10ML IV SOSY
PREFILLED_SYRINGE | INTRAVENOUS | Status: DC | PRN
  Administered 2020-10-15: 200 mg via INTRAVENOUS

## 2020-10-15 MED ORDER — LACTATED RINGERS IV BOLUS: 1000.0000 mL | Freq: Once | INTRAVENOUS | Status: DC

## 2020-10-15 MED ORDER — PHENYLEPHRINE HCL (PRESSORS) 10 MG/ML IV SOLN
INTRAVENOUS | Status: AC
  Filled 2020-10-15: qty 1

## 2020-10-15 MED ORDER — BUPIVACAINE LIPOSOME 1.3 % IJ SUSP
INTRAMUSCULAR | Status: AC
  Filled 2020-10-15: qty 20

## 2020-10-15 MED ORDER — BUPIVACAINE HCL (PF) 0.5 % IJ SOLN
INTRAMUSCULAR | Status: AC
  Filled 2020-10-15: qty 30

## 2020-10-15 MED ORDER — PROPOFOL 500 MG/50ML IV EMUL
INTRAVENOUS | Status: AC
  Filled 2020-10-15: qty 50

## 2020-10-15 MED ORDER — LACTATED RINGERS IV BOLUS
1000.0000 mL | Freq: Once | INTRAVENOUS | Status: AC
  Administered 2020-10-15: 1000 mL via INTRAVENOUS

## 2020-10-15 MED ORDER — PIPERACILLIN-TAZOBACTAM 3.375 G IVPB
3.3750 g | Freq: Three times a day (TID) | INTRAVENOUS | Status: AC
  Administered 2020-10-15 – 2020-10-19 (×13): 3.375 g via INTRAVENOUS
  Filled 2020-10-15 (×12): qty 50

## 2020-10-15 MED ORDER — MIDAZOLAM HCL 2 MG/2ML IJ SOLN
INTRAMUSCULAR | Status: AC
  Filled 2020-10-15: qty 2

## 2020-10-15 MED ORDER — SEVOFLURANE IN SOLN
RESPIRATORY_TRACT | Status: AC
  Filled 2020-10-15: qty 250

## 2020-10-15 MED ORDER — FUROSEMIDE 20 MG PO TABS
20.0000 mg | ORAL_TABLET | Freq: Every day | ORAL | Status: DC
  Administered 2020-10-15: 20 mg via ORAL
  Filled 2020-10-15: qty 1

## 2020-10-15 MED ORDER — SODIUM CHLORIDE 0.9 % IV SOLN
INTRAVENOUS | Status: DC | PRN
  Administered 2020-10-15: 50 ug/min via INTRAVENOUS

## 2020-10-15 MED ORDER — FENTANYL CITRATE (PF) 100 MCG/2ML IJ SOLN
INTRAMUSCULAR | Status: DC | PRN
  Administered 2020-10-15 – 2020-10-16 (×2): 50 ug via INTRAVENOUS

## 2020-10-15 MED ORDER — SODIUM CHLORIDE (PF) 0.9 % IJ SOLN
INTRAMUSCULAR | Status: AC
  Filled 2020-10-15: qty 20

## 2020-10-15 MED ORDER — ROCURONIUM BROMIDE 100 MG/10ML IV SOLN
INTRAVENOUS | Status: DC | PRN
  Administered 2020-10-15: 50 mg via INTRAVENOUS
  Administered 2020-10-15: 30 mg via INTRAVENOUS
  Administered 2020-10-15: 20 mg via INTRAVENOUS
  Administered 2020-10-16: 50 mg via INTRAVENOUS
  Administered 2020-10-16: 30 mg via INTRAVENOUS

## 2020-10-15 MED ORDER — PROPOFOL 10 MG/ML IV BOLUS
INTRAVENOUS | Status: DC | PRN
  Administered 2020-10-15: 200 mg via INTRAVENOUS

## 2020-10-15 MED ORDER — IOHEXOL 350 MG/ML SOLN
125.0000 mL | Freq: Once | INTRAVENOUS | Status: AC | PRN
  Administered 2020-10-15: 125 mL via INTRAVENOUS

## 2020-10-15 MED ORDER — LACTATED RINGERS IV SOLN: INTRAVENOUS | Status: DC | PRN

## 2020-10-15 MED ORDER — BISOPROLOL FUMARATE 5 MG PO TABS
5.0000 mg | ORAL_TABLET | Freq: Every evening | ORAL | Status: DC
  Administered 2020-10-15: 5 mg via ORAL
  Filled 2020-10-15 (×2): qty 1

## 2020-10-15 MED ORDER — PHENYLEPHRINE HCL (PRESSORS) 10 MG/ML IV SOLN
INTRAVENOUS | Status: DC | PRN
  Administered 2020-10-15: 100 ug via INTRAVENOUS
  Administered 2020-10-15 (×2): 200 ug via INTRAVENOUS
  Administered 2020-10-16: 100 ug via INTRAVENOUS

## 2020-10-15 MED ORDER — IPRATROPIUM-ALBUTEROL 0.5-2.5 (3) MG/3ML IN SOLN
3.0000 mL | Freq: Four times a day (QID) | RESPIRATORY_TRACT | Status: DC
  Administered 2020-10-15 – 2020-10-16 (×2): 3 mL via RESPIRATORY_TRACT
  Filled 2020-10-15 (×3): qty 3

## 2020-10-15 MED ORDER — INDOCYANINE GREEN 25 MG IV SOLR
5.0000 mg | Freq: Once | INTRAVENOUS | Status: DC
  Filled 2020-10-15: qty 10

## 2020-10-15 SURGICAL SUPPLY — 115 items
ADH SKN CLS APL DERMABOND .7 (GAUZE/BANDAGES/DRESSINGS)
APL PRP STRL LF DISP 70% ISPRP (MISCELLANEOUS) ×2
BLADE CLIPPER SURG (BLADE) ×2 IMPLANT
BLADE SURG SZ10 CARB STEEL (BLADE) ×1 IMPLANT
BLADE SURG SZ11 CARB STEEL (BLADE) ×2 IMPLANT
BULB RESERV EVAC DRAIN JP 100C (MISCELLANEOUS) ×1 IMPLANT
CANISTER SUCT 1200ML W/VALVE (MISCELLANEOUS) ×1 IMPLANT
CANNULA REDUC XI 12-8 STAPL (CANNULA)
CANNULA REDUCER 12-8 DVNC XI (CANNULA) ×1 IMPLANT
CHLORAPREP W/TINT 26 (MISCELLANEOUS) ×3 IMPLANT
COVER TIP SHEARS 8 DVNC (MISCELLANEOUS) ×1 IMPLANT
COVER TIP SHEARS 8MM DA VINCI (MISCELLANEOUS)
DEFOGGER SCOPE WARMER CLEARIFY (MISCELLANEOUS) ×3 IMPLANT
DERMABOND ADVANCED (GAUZE/BANDAGES/DRESSINGS)
DERMABOND ADVANCED .7 DNX12 (GAUZE/BANDAGES/DRESSINGS) IMPLANT
DRAIN CHANNEL JP 15F RND 16 (MISCELLANEOUS) ×1 IMPLANT
DRAPE ARM DVNC X/XI (DISPOSABLE) ×4 IMPLANT
DRAPE COLUMN DVNC XI (DISPOSABLE) ×1 IMPLANT
DRAPE DA VINCI XI ARM (DISPOSABLE) ×8
DRAPE DA VINCI XI COLUMN (DISPOSABLE) ×2
DRAPE LEGGINS SURG 28X43 STRL (DRAPES) ×1 IMPLANT
DRSG OPSITE POSTOP 3X4 (GAUZE/BANDAGES/DRESSINGS) ×3 IMPLANT
DRSG OPSITE POSTOP 4X10 (GAUZE/BANDAGES/DRESSINGS) ×2 IMPLANT
DRSG OPSITE POSTOP 4X8 (GAUZE/BANDAGES/DRESSINGS) IMPLANT
ELECT BLADE 6.5 EXT (BLADE) IMPLANT
ELECT CAUTERY BLADE 6.4 (BLADE) ×1 IMPLANT
ELECT REM PT RETURN 9FT ADLT (ELECTROSURGICAL)
ELECTRODE REM PT RTRN 9FT ADLT (ELECTROSURGICAL) ×1 IMPLANT
GAUZE 4X4 16PLY ~~LOC~~+RFID DBL (SPONGE) ×2 IMPLANT
GLOVE SURG SYN 6.5 ES PF (GLOVE) ×6 IMPLANT
GLOVE SURG SYN 6.5 PF PI (GLOVE) ×3 IMPLANT
GLOVE SURG SYN 7.0 (GLOVE) ×4 IMPLANT
GLOVE SURG SYN 7.0 PF PI (GLOVE) IMPLANT
GLOVE SURG UNDER POLY LF SZ7 (GLOVE) ×5 IMPLANT
GLOVE SURG UNDER POLY LF SZ7.5 (GLOVE) ×1 IMPLANT
GOWN STRL REUS W/ TWL LRG LVL3 (GOWN DISPOSABLE) ×6 IMPLANT
GOWN STRL REUS W/TWL LRG LVL3 (GOWN DISPOSABLE) ×4
GRASPER SUT TROCAR 14GX15 (MISCELLANEOUS) IMPLANT
HANDLE SUCTION POOLE (INSTRUMENTS) IMPLANT
HANDLE YANKAUER SUCT BULB TIP (MISCELLANEOUS) ×2 IMPLANT
IRRIGATION STRYKERFLOW (MISCELLANEOUS) IMPLANT
IRRIGATOR STRYKERFLOW (MISCELLANEOUS)
IRRIGATOR SUCT 8 DISP DVNC XI (IRRIGATION / IRRIGATOR) IMPLANT
IRRIGATOR SUCTION 8MM XI DISP (IRRIGATION / IRRIGATOR)
IV NS 1000ML (IV SOLUTION)
IV NS 1000ML BAXH (IV SOLUTION) IMPLANT
KIT PINK PAD W/HEAD ARE REST (MISCELLANEOUS) ×2 IMPLANT
KIT PINK PAD W/HEAD ARM REST (MISCELLANEOUS) ×1 IMPLANT
LABEL OR SOLS (LABEL) IMPLANT
MANIFOLD NEPTUNE II (INSTRUMENTS) ×2 IMPLANT
NDL INSUFFLATION 14GA 120MM (NEEDLE) ×1 IMPLANT
NEEDLE HYPO 22GX1.5 SAFETY (NEEDLE) ×1 IMPLANT
NEEDLE INSUFFLATION 14GA 120MM (NEEDLE) ×2 IMPLANT
OBTURATOR OPTICAL STANDARD 8MM (TROCAR)
OBTURATOR OPTICAL STND 8 DVNC (TROCAR)
OBTURATOR OPTICALSTD 8 DVNC (TROCAR) ×1 IMPLANT
PACK COLON CLEAN CLOSURE (MISCELLANEOUS) ×1 IMPLANT
PACK LAP CHOLECYSTECTOMY (MISCELLANEOUS) ×2 IMPLANT
PENCIL ELECTRO HAND CTR (MISCELLANEOUS) ×2 IMPLANT
PORT ACCESS TROCAR AIRSEAL 5 (TROCAR) ×1 IMPLANT
RELOAD STAPLE 45 3.5 BLU DVNC (STAPLE) IMPLANT
RELOAD STAPLE 60 3.5 BLU DVNC (STAPLE) IMPLANT
RELOAD STAPLER 3.5X45 BLU DVNC (STAPLE) IMPLANT
RELOAD STAPLER 3.5X60 BLU DVNC (STAPLE) IMPLANT
RETRACTOR RING XSMALL (MISCELLANEOUS) ×1 IMPLANT
RETRACTOR WOUND ALXS 18CM MED (MISCELLANEOUS) ×1 IMPLANT
RTRCTR WOUND ALEXIS 13CM XS SH (MISCELLANEOUS)
RTRCTR WOUND ALEXIS O 18CM MED (MISCELLANEOUS)
SEAL CANN UNIV 5-8 DVNC XI (MISCELLANEOUS) ×3 IMPLANT
SEAL XI 5MM-8MM UNIVERSAL (MISCELLANEOUS)
SEALER TISSUE X1 CVD JAW (INSTRUMENTS) ×1 IMPLANT
SEALER VESSEL DA VINCI XI (MISCELLANEOUS)
SEALER VESSEL EXT DVNC XI (MISCELLANEOUS) IMPLANT
SET BI-LUMEN FLTR TB AIRSEAL (TUBING) ×1 IMPLANT
SET TRI-LUMEN FLTR TB AIRSEAL (TUBING) ×1 IMPLANT
SET TUBE SMOKE EVAC HIGH FLOW (TUBING) ×1 IMPLANT
SLEEVE ENDOPATH XCEL 5M (ENDOMECHANICALS) ×1 IMPLANT
SOLUTION ELECTROLUBE (MISCELLANEOUS) ×2 IMPLANT
SPONGE DRAIN TRACH 4X4 STRL 2S (GAUZE/BANDAGES/DRESSINGS) ×1 IMPLANT
SPONGE T-LAP 18X18 ~~LOC~~+RFID (SPONGE) ×3 IMPLANT
STAPLER 45 DA VINCI SURE FORM (STAPLE)
STAPLER 45 SUREFORM DVNC (STAPLE) IMPLANT
STAPLER 60 DA VINCI SURE FORM (STAPLE)
STAPLER 60 SUREFORM DVNC (STAPLE) IMPLANT
STAPLER CANNULA SEAL DVNC XI (STAPLE) ×1 IMPLANT
STAPLER CANNULA SEAL XI (STAPLE)
STAPLER RELOAD 3.5X45 BLU DVNC (STAPLE)
STAPLER RELOAD 3.5X45 BLUE (STAPLE)
STAPLER RELOAD 3.5X60 BLU DVNC (STAPLE)
STAPLER RELOAD 3.5X60 BLUE (STAPLE)
STAPLER SKIN PROX 35W (STAPLE) ×2 IMPLANT
STRAP SAFETY 5IN WIDE (MISCELLANEOUS) ×3 IMPLANT
SUCTION POOLE HANDLE (INSTRUMENTS) ×4
SUT ETHILON 3-0 FS-10 30 BLK (SUTURE) ×2
SUT MNCRL 4-0 (SUTURE)
SUT MNCRL 4-0 27XMFL (SUTURE)
SUT MNCRL AB 4-0 PS2 18 (SUTURE) ×1 IMPLANT
SUT PDS AB 1 CT1 36 (SUTURE) ×5 IMPLANT
SUT SILK 3-0 (SUTURE) ×1 IMPLANT
SUT VIC AB 3-0 SH 27 (SUTURE) ×6
SUT VIC AB 3-0 SH 27X BRD (SUTURE) ×4 IMPLANT
SUT VICRYL 0 AB UR-6 (SUTURE) ×1 IMPLANT
SUT VLOC 90 6 CV-15 VIOLET (SUTURE) ×1 IMPLANT
SUTURE EHLN 3-0 FS-10 30 BLK (SUTURE) IMPLANT
SUTURE MNCRL 4-0 27XMF (SUTURE) ×2 IMPLANT
SWAB CULTURE AMIES ANAERIB BLU (MISCELLANEOUS) ×1 IMPLANT
SYR 30ML LL (SYRINGE) ×2 IMPLANT
SYS LAPSCP GELPORT 120MM (MISCELLANEOUS)
SYSTEM LAPSCP GELPORT 120MM (MISCELLANEOUS) ×1 IMPLANT
SYSTEM WECK SHIELD CLOSURE (TROCAR) IMPLANT
TRAY FOLEY MTR SLVR 16FR STAT (SET/KITS/TRAYS/PACK) ×2 IMPLANT
TROCAR 5M 150ML BLDLS (TROCAR) ×3 IMPLANT
TROCAR XCEL NON-BLD 5MMX100MML (ENDOMECHANICALS) ×1 IMPLANT
TUBING CONNECTING 10 (TUBING) ×1 IMPLANT
TUBING EVAC SMOKE HEATED PNEUM (TUBING) ×1 IMPLANT

## 2020-10-15 NOTE — Progress Notes (Signed)
Patient ID: Christian [name], male   DOB: 01-11-1976, 45 y.o.   MRN: [id number] [facility] PROGRESS NOTE  Eliberto Sole DXA:[id number] DOB: [birth year] DOA: 10/09/2020 PCP: Pcp, No  HPI/Subjective: Patient feels a little bit better with regards to his abdomen.  Still having a little bit of drainage.  Unable to come off the oxygen.  Slight shortness of breath.  Objective: Vitals:   10/15/20 1455 10/15/20 1457  BP:  (!) 147/98  Pulse: (!) 103 (!) 105  Resp: (!) 26 (!) 26  Temp:  (!) 97.5 F (36.4 C)  SpO2: 90% 93%    Intake/Output Summary (Last 24 hours) at 10/15/2020 1516 Last data filed at 10/15/2020 1300 Gross per 24 hour  Intake 720 ml  Output 100 ml  Net 620 ml   Filed Weights   10/09/20 0807  Weight: (!) 197.3 kg    ROS: Review of Systems  Respiratory:  Positive for cough and shortness of breath.   Cardiovascular:  Negative for chest pain.  Gastrointestinal:  Negative for abdominal pain, nausea and vomiting.  Musculoskeletal:  Positive for joint pain.  Exam: Physical Exam HENT:     Head: Normocephalic.     Mouth/Throat:     Pharynx: No oropharyngeal exudate.  Eyes:     General: Lids are normal.     Conjunctiva/sclera: Conjunctivae normal.  Cardiovascular:     Rate and Rhythm: Normal rate and regular rhythm.     Heart sounds: Normal heart sounds, S1 normal and S2 normal.  Pulmonary:     Breath sounds: Examination of the right-lower field reveals decreased breath sounds. Examination of the left-lower field reveals decreased breath sounds. Decreased breath sounds present. No wheezing, rhonchi or rales.  Abdominal:     Palpations: Abdomen is soft.     Tenderness: There is abdominal tenderness in the suprapubic area.  Musculoskeletal:     Right lower leg: Swelling present.     Left lower leg: Swelling present.  Skin:    General: Skin is warm.     Comments: Lower abdomen.  Less drainage from site.  Neurological:     Mental Status: He is alert and  oriented to person, place, and time.      Scheduled Meds:  amLODipine  5 mg Oral Daily   bisoprolol  5 mg Oral QPM   budesonide (PULMICORT) nebulizer solution  0.5 mg Nebulization BID   cephALEXin  1,000 mg Oral Q8H   collagenase   Topical Daily   enoxaparin (LOVENOX) injection  0.5 mg/kg Subcutaneous Q24H   fluconazole  200 mg Oral Q24H   furosemide  20 mg Oral Daily   ipratropium-albuterol  3 mL Nebulization Q6H   potassium chloride  20 mEq Oral BID     Assessment/Plan:  Severe sepsis, present on admission with acute kidney injury and abdominal wall cellulitis with bilateral lower extremity and buttock wounds.  Patient now on oral Keflex and oral Diflucan Pressure injuries bilateral buttocks and legs, present on admission.  See description below. Morbid obesity with sleep apnea Acute hypoxic respiratory failure.  Unable to come off oxygen.  Likely obesity hypoventilation syndrome.  We will start nebulizer treatments and low-dose Lasix to see if I can get him off the oxygen.  We will obtain a chest x-ray. Weakness.  PT recommends rehab but patient does not have health insurance. Essential hypertension and tachycardia we will start low-dose bisoprolol.  Patient already on low-dose Norvasc. Acute kidney injury.  Creatinine peaked at 1.43 and is  down to 0.99.  Pressure Injury 10/09/20 Buttocks Left Unstageable - Full thickness tissue loss in which the base of the injury is covered by slough (yellow, tan, gray, green or brown) and/or eschar (tan, brown or black) in the wound bed. (Active)  10/09/20   Location: Buttocks  Location Orientation: Left  Staging: Unstageable - Full thickness tissue loss in which the base of the injury is covered by slough (yellow, tan, gray, green or brown) and/or eschar (tan, brown or black) in the wound bed.  Wound Description (Comments):   Present on Admission: Yes     Pressure Injury 10/09/20 Buttocks Right Unstageable - Full thickness tissue loss in  which the base of the injury is covered by slough (yellow, tan, gray, green or brown) and/or eschar (tan, brown or black) in the wound bed. (Active)  10/09/20   Location: Buttocks  Location Orientation: Right  Staging: Unstageable - Full thickness tissue loss in which the base of the injury is covered by slough (yellow, tan, gray, green or brown) and/or eschar (tan, brown or black) in the wound bed.  Wound Description (Comments):   Present on Admission: Yes     Pressure Injury 10/09/20 Heel Left Stage 2 -  Partial thickness loss of dermis presenting as a shallow open injury with a red, pink wound bed without slough. (Active)  10/09/20   Location: Heel  Location Orientation: Left  Staging: Stage 2 -  Partial thickness loss of dermis presenting as a shallow open injury with a red, pink wound bed without slough.  Wound Description (Comments):   Present on Admission: Yes     Pressure Injury 10/09/20 Heel Right Stage 2 -  Partial thickness loss of dermis presenting as a shallow open injury with a red, pink wound bed without slough. (Active)  10/09/20   Location: Heel  Location Orientation: Right  Staging: Stage 2 -  Partial thickness loss of dermis presenting as a shallow open injury with a red, pink wound bed without slough.  Wound Description (Comments):   Present on Admission: Yes     Pressure Injury Foot Right Stage 2 -  Partial thickness loss of dermis presenting as a shallow open injury with a red, pink wound bed without slough. (Active)     Location: Foot  Location Orientation: Right  Staging: Stage 2 -  Partial thickness loss of dermis presenting as a shallow open injury with a red, pink wound bed without slough.  Wound Description (Comments):   Present on Admission: Yes       Code Status:     Code Status Orders  (From admission, onward)           Start     Ordered   10/09/20 1235  Full code  Continuous        10/09/20 1236           Code Status History      This patient has a current code status but no historical code status.      Family Communication: Tried to reach to patient's friend [place] at [phone] but mailbox is not set up Disposition Plan: Status is: Inpatient  Dispo: The patient is from: His car              Anticipated d/c is to: To be determined              Patient currently too weak and too many wounds in order to go to a shelter   Difficult to  place patient.  Yes.  Antibiotics: Keflex Diflucan  Time spent: 27 minutes  Zariya Minner [employer]

## 2020-10-15 NOTE — TOC Progression Note (Signed)
Transition of Care [facility]) - Progression Note    Patient Details  Name: Christian [name] MRN: [id number] Date of Birth: Nov 25, 1975  [facility] CM/SW Contact  Margarito Liner, LCSW Phone Number: 10/15/2020, 11:08 AM  Clinical Narrative:  Patient said he has not heard back from his friends that he reached out to in order to see if he could stay with them.     Barriers to Discharge: Continued Medical Work up  Expected Discharge Plan and Services         Living arrangements for the past 2 months: Homeless                                       Social Determinants of Health (SDOH) Interventions    Readmission Risk Interventions Readmission Risk Prevention Plan 10/11/2020  Post Dischage Appt Complete  Medication Screening Complete  Transportation Screening Complete  Some recent data might be hidden

## 2020-10-15 NOTE — Progress Notes (Signed)
Occupational Therapy Treatment Patient Details Name: Christian [name] MRN: [id number] DOB: 05-15-1975 Today's Date: 10/15/2020    History of present illness Pt is a 45 y/o M who presented to ED on 10/09/20 with c/c of bilateral lower abdominal pain & c/o emesis twice that day after eating. Pt found to have abdominal wall purulent cellulitis. Pt admitted with clinical sepsis & AKI. PMH: morbid obesity, HTN   OT comments  Pt seen for PT/OT co-tx this date to f/u re: safety with ADLs/ADL mobility. Pt agreeable to session. Co-Tx completed to maximize services d/t pt currently presenting with low fxl tolerance. Pt requires MOD A for rolling in bed. OT engages pt in bed level UB bathing with SETUP to MIN A and bed level LB bathing with MAX/TOTAL A (pt minimally able to contribute to anterior LB bathing in high-fowler's position with cues to initiate. Pt able to come to EOB sitting with MOD/MAX A +2. Pt demos P static sitting balance requiring at least MIN A for most of static EOB sitting, challenged to improve static sitting balance through changing hand placement by OT and able to demo short bouts of F sitting. Pt requires TOTAL A +2-3 persons for hoyer transfer from bed to chair. OT educates RN on use of chucks between pt skin and hoyer pad to prevent further breakdown. Pt left with call light and all needs in reach. Pt encouraged to sit up x1 hour to improve his OOB tolerance. Increased time required in all aspects of treatment session d/t pt with generally low tolerance requiring extensive rest breaks between all aspects of mobilizing as well as PT/OT assisting in bed mobility for RN to be able to dress wounds. Will continue to follow.    Follow Up Recommendations  SNF;Supervision/Assistance - 24 hour    Equipment Recommendations  Other (comment) (defer to next level of care, anticiapte pt will require hoyer and hospital bed)    Recommendations for Other Services      Precautions / Restrictions  Precautions Precautions: Fall Restrictions Weight Bearing Restrictions: No       Mobility Bed Mobility Overal bed mobility: Needs Assistance Bed Mobility: Rolling;Supine to Sit;Sit to Supine Rolling: Min assist;Mod assist   Supine to sit: Mod assist;Max assist;+2 for physical assistance Sit to supine: Mod assist;Max assist;+2 for safety/equipment   General bed mobility comments: size wise bed deflated to allow for ease of movement (to reduce resistance)    Transfers                 General transfer comment: attempted STS, but difficult to SETUP considering level of bed, position of size-wise bed railing, and pt's posterior wounds. Ultimately HOYER and TOTAL A +2-3 persons required to adequately position pt OOB to the chair.    Balance Overall balance assessment: Needs assistance Sitting-balance support: Bilateral upper extremity supported;Feet supported;Feet unsupported Sitting balance-[name]: Poor Sitting balance - Comments: instances of F balance with use of UE support, but primarily needs at least MIN/MOD A to sustain static EOB sitting. Postural control: Posterior lean   Standing balance-[name]: Zero                             ADL either performed or assessed with clinical judgement   ADL Overall ADL's : Needs assistance/impaired     Grooming: Wash/dry face;Applying deodorant;Set up;Minimal assistance;Bed level Grooming Details (indicate cue type and reason): high-folwer's with cues to initiate and ed with RN  re: allowing pt to do as much for himself as possible Upper Body Bathing: Minimal assistance;Moderate assistance;Bed level Upper Body Bathing Details (indicate cue type and reason): increased assist for lower abdomen d/t skin folds, pt encouraged to participate by lifting adipose tissue up and away Lower Body Bathing: Maximal assistance;Total assistance;+2 for physical assistance;Bed level Lower Body Bathing Details (indicate cue type  and reason): rolling, limited d/t body habitus as well as general deconditioning Upper Body Dressing : Minimal assistance;Bed level   Lower Body Dressing: Total assistance       Toileting- Clothing Manipulation and Hygiene: Total assistance;+2 for physical assistance;Bed level Toileting - Clothing Manipulation Details (indicate cue type and reason): TOTAL A for posterior peri care, rolling technique in bed. Pt able to minimally contribute to anterior in high-folwer's position             Vision Baseline Vision/History: Wears glasses Wears Glasses: At all times Patient Visual Report: No change from baseline     Perception     Praxis      Cognition Arousal/Alertness: Awake/alert Behavior During Therapy: WFL for tasks assessed/performed Overall Cognitive Status: Within Functional Limits for tasks assessed                                 General Comments: AxOx4, while pt is pleasant and agreeable to session, he is noted to have some decreased insight into what is required to maintain his general health and well-being, ex: non-chalant in his description of several episodes in which he became too weak to get to the restroom-not seeing that this would have been an appropriate time to seak medical attention        Exercises Other Exercises Other Exercises: OT/PT engaged pt in rolling to address bathing and wound needs with assistance of nursing for wounds. Pt educated on importance of repositioning to allow air to get to wounds and decreased excess moisture. Pt educated on importance of OOB activity including hoyer transfer to chair. PT/OT engage pt in hoyer lift to chair with third person assisting to man the controls.   Shoulder Instructions       General Comments      Pertinent Vitals/ Pain       Pain Assessment: Faces Faces Pain Scale: Hurts a little bit Pain Location: Sacral wound Pain Descriptors / Indicators: Grimacing Pain Intervention(s): Limited  activity within patient's tolerance;Monitored during session;Repositioned (PT/OT assist RN in wound care, positioniong in bed, rolling to get air to skin)  Home Living                                          Prior Functioning/Environment              Frequency  Min 1X/week        Progress Toward Goals  OT Goals(current goals can now be found in the care plan section)  Progress towards OT goals: Progressing toward goals  Acute Rehab OT Goals Patient Stated Goal: none stated OT Goal Formulation: With patient Time For Goal Achievement: 10/25/20 Potential to Achieve Goals: Good  Plan Discharge plan remains appropriate;Frequency remains appropriate    Co-evaluation    PT/OT/SLP Co-Evaluation/Treatment: Yes Reason for Co-Treatment: Complexity of the patient's impairments (multi-system involvement);To address functional/ADL transfers;For patient/therapist safety PT goals addressed during session: Mobility/safety with mobility;Proper use of  DME;Strengthening/ROM OT goals addressed during session: ADL's and self-care;Strengthening/ROM;Proper use of Adaptive equipment and DME      AM-PAC OT "6 Clicks" Daily Activity     Outcome Measure   Help from another person eating meals?: None Help from another person taking care of personal grooming?: None Help from another person toileting, which includes using toliet, bedpan, or urinal?: Total Help from another person bathing (including washing, rinsing, drying)?: A Lot Help from another person to put on and taking off regular upper body clothing?: A Lot Help from another person to put on and taking off regular lower body clothing?: Total 6 Click Score: 14    End of Session Equipment Utilized During Treatment: Oxygen  OT Visit Diagnosis: Unsteadiness on feet (R26.81);Muscle weakness (generalized) (M62.81)   Activity Tolerance Patient tolerated treatment well;Treatment limited secondary to medical complications  (Comment) (HR elevated to ~118-120 with activity, RR elevated wtih activity, RN and RT aware.)   Patient Left in chair;with call bell/phone within reach;Other (comment) (with hoyer pad underneath, LEs elevated)   Nurse Communication Mobility status        Time: 3437-3578 OT Time Calculation (min): 83 min  Charges: OT General Charges $OT Visit: 1 Visit OT Treatments $Self Care/Home Management : 38-52 mins  Rejeana Brock, MS, OTR/L ascom [phone] 10/15/20, 6:59 PM

## 2020-10-15 NOTE — Progress Notes (Addendum)
Patient ID: Christian [name], male   DOB: 1975-06-05, 45 y.o.   MRN: [id number]  Called by radiology that the CXR was concerning for free air.  No Pneumonia seen.  Previous ct scan showed ilues and patient has has bowel movements.  Case discussed with Dr Tonna Boehringer general surgery too see.  Will change abx to zosyn.  Updated patient that the surgeon will come see him and evaluate him on weather surgery is needed.  Dr Tonna Boehringer rec stat CT scan with iv contrast and he will eval after.  Dr Renae Gloss

## 2020-10-15 NOTE — Anesthesia Preprocedure Evaluation (Signed)
Anesthesia Evaluation  Patient identified by MRN, date of birth, ID band Patient awake  General Assessment Comment:  Free intraperitoneal air.  Reviewed: Allergy & Precautions, NPO status , Patient's Chart, lab work & pertinent test results  History of Anesthesia Complications Negative for: history of anesthetic complications  Airway Mallampati: III  TM Distance: >3 FB Neck ROM: Full    Dental  (+) Poor Dentition, Chipped   Pulmonary neg pulmonary ROS, neg sleep apnea, neg COPD, Patient abstained from smoking.Not current smoker,  Signs and symptoms suggestive of OSA   Pulmonary exam normal  + decreased breath sounds      Cardiovascular Exercise Tolerance: Good METShypertension, (-) CAD and (-) Past MI (-) dysrhythmias  Rhythm:Regular Rate:Normal - Systolic murmurs    Neuro/Psych negative neurological ROS  negative psych ROS   GI/Hepatic neg GERD  ,(+)     (-) substance abuse  ,   Endo/Other  neg diabetesMorbid obesity  Renal/GU negative Renal ROS     Musculoskeletal   Abdominal (+) + obese,   Peds  Hematology   Anesthesia Other Findings Past Medical History: No date: Allergic rhinitis No date: Elevated blood pressure reading without diagnosis of  hypertension No date: History of chicken pox No date: Obesity  Reproductive/Obstetrics                             Anesthesia Physical Anesthesia Plan  ASA: 4 and emergent  Anesthesia Plan: General   Post-op Pain Management:    Induction: Intravenous  PONV Risk Score and Plan: 3 and Ondansetron, Dexamethasone, Treatment may vary due to age or medical condition and Midazolam  Airway Management Planned: Oral ETT and Video Laryngoscope Planned  Additional Equipment: None  Intra-op Plan:   Post-operative Plan: Extubation in OR and Possible Post-op intubation/ventilation  Informed Consent: I have reviewed the patients History and  Physical, chart, labs and discussed the procedure including the risks, benefits and alternatives for the proposed anesthesia with the patient or authorized representative who has indicated his/her understanding and acceptance.     Dental advisory given  Plan Discussed with: CRNA and Surgeon  Anesthesia Plan Comments: (Discussed risks of anesthesia with patient, including PONV, sore throat, lip/dental damage. Rare risks discussed as well, such as cardiorespiratory and neurological sequelae, and allergic reactions. Body mass index is 55.55 kg/m. Patient informed about increased incidence of above perioperative risk due to high BMI. Patient understands. )        Anesthesia Quick Evaluation

## 2020-10-15 NOTE — TOC Progression Note (Signed)
Transition of Care [facility]) - Progression Note    Patient Details  Name: Christian [name] MRN: [id number] Date of Birth: 1975/12/08  Transition of Care [facility]) CM/SW Contact  Littlefork Cellar, RN Phone Number: 10/15/2020, 2:55 PM  Clinical Narrative:    Discussed case with Amy @ [facility] and confirmed patient is not eligible for Medicaid and does not have Medicaid or disability application being reviewed due to not meeting requirements.     Barriers to Discharge: Continued Medical Work up  Expected Discharge Plan and Services         Living arrangements for the past 2 months: Homeless                                       Social Determinants of Health (SDOH) Interventions    Readmission Risk Interventions Readmission Risk Prevention Plan 10/11/2020  Post Dischage Appt Complete  Medication Screening Complete  Transportation Screening Complete  Some recent data might be hidden

## 2020-10-15 NOTE — Consult Note (Signed)
Pharmacy Antibiotic Note  Christian [name] is a 45 y.o. male admitted on 10/09/2020 with sepsis.  CXR today was concerning for free air. Pharmacy has been consulted for Zosyn dosing.  Plan: Zosyn 3.375 g extended infusion q8h  Monitor clinical picture and renal function F/U C&S, abx deescalation / LOT  Height: 6\' 3"  (190.5 cm) Weight: (!) 201.6 kg (444 lb 6.4 oz) IBW/kg (Calculated) : 84.5  Temp (24hrs), Avg:98.2 F (36.8 C), Min:97.5 F (36.4 C), Max:99.1 F (37.3 C)  Recent Labs  Lab 10/09/20 0811 10/09/20 1644 10/09/20 1959 10/10/20 0525 10/11/20 1444 10/12/20 0105 10/12/20 0516  WBC 23.2*  --   --  16.8*  --   --  9.2  CREATININE 1.43*  --   --  1.13  --   --  0.99  LATICACIDVEN  --  1.7 1.1  --   --   --   --   VANCOTROUGH  --   --   --   --   --  13*  --   VANCOPEAK  --   --   --   --  26*  --   --     Estimated Creatinine Clearance: 173.2 mL/min (by C-G formula based on SCr of 0.99 mg/dL).    No Known Allergies  Antimicrobials this admission: 8/12 ceftriaxone x1 8/12 vancomycin >> 8/15 8/13 fluconazone >>  8/15 keflex >> 8/18  Dose adjustments this admission: N/A  Microbiology results: 8/12 BCx: [id number] 8/12 GI panel: negative 8/12 C diff: negative 8/15 MRSA PCR: negative  Thank you for allowing pharmacy to be a part of this patient's care.  9/15, PharmD 10/15/2020 7:26 PM

## 2020-10-15 NOTE — Anesthesia Procedure Notes (Addendum)
Procedure Name: Intubation Date/Time: 10/15/2020 11:05 PM Performed by: Elmarie Mainland, CRNA Pre-anesthesia Checklist: Patient identified, Emergency Drugs available, Suction available and Patient being monitored Patient Re-evaluated:Patient Re-evaluated prior to induction Oxygen Delivery Method: Circle system utilized Preoxygenation: Pre-oxygenation with 100% oxygen Induction Type: IV induction and Rapid sequence Laryngoscope Size: McGraph and 4 Grade View: Grade I Tube type: Oral Tube size: 7.5 mm Number of attempts: 1 Airway Equipment and Method: Stylet, Patient positioned with wedge pillow and Video-laryngoscopy Placement Confirmation: ETT inserted through vocal cords under direct vision, positive ETCO2 and breath sounds checked- equal and bilateral Secured at: 23 cm Tube secured with: Tape Dental Injury: Teeth and Oropharynx as per pre-operative assessment  Comments: Patient ramped with blankets underneath. Unremarkable RSI. MV not attempted

## 2020-10-15 NOTE — Progress Notes (Signed)
   10/15/20 1457  Assess: MEWS Score  Temp (!) 97.5 F (36.4 C)  BP (!) 147/98  Pulse Rate (!) 105  Resp (!) 26  SpO2 93 %  O2 Device Nasal Cannula  O2 Flow Rate (L/min) 4 L/min  Assess: MEWS Score  MEWS Temp 0  MEWS Systolic 0  MEWS Pulse 1  MEWS RR 2  MEWS LOC 0  MEWS Score 3  MEWS Score Color Yellow  Assess: if the MEWS score is Yellow or Red  Were vital signs taken at a resting state? Yes  Focused Assessment Change from prior assessment (see assessment flowsheet)  Does the patient meet 2 or more of the SIRS criteria? Yes  Does the patient have a confirmed or suspected source of infection? Yes  Provider and Rapid Response Notified? Yes  MEWS guidelines implemented *See Row Information* Yes  Treat  MEWS Interventions Administered scheduled meds/treatments  Take Vital Signs  Increase Vital Sign Frequency  Yellow: Q 2hr X 2 then Q 4hr X 2, if remains yellow, continue Q 4hrs  Escalate  MEWS: Escalate Yellow: discuss with charge nurse/RN and consider discussing with provider and RRT  Notify: Charge Nurse/RN  Name of Charge Nurse/RN Notified Agnes Lawrence, RN  Date Charge Nurse/RN Notified 10/15/20  Time Charge Nurse/RN Notified 1459  Notify: Provider  Provider Name/Title Dr. Renae Gloss  Date Provider Notified 10/15/20  Time Provider Notified 1502  Notification Type Page  Notification Reason Change in status  Provider response See new orders;Other (Comment) (Beta blocker given & Chest X-Ray completed)  Date of Provider Response 10/15/20  Time of Provider Response 1508  Notify: Rapid Response  Name of Rapid Response RN Notified Megan,RN  Date Rapid Response Notified 10/15/20  Time Rapid Response Notified 1502  Document  Patient Outcome Stabilized after interventions  Progress note created (see row info) Yes  Assess: SIRS CRITERIA  SIRS Temperature  0  SIRS Pulse 1  SIRS Respirations  1  SIRS WBC 0  SIRS Score Sum  2

## 2020-10-15 NOTE — Consult Note (Addendum)
Subjective:   CC: concern for free air  HPI:  Christian [name] is a 45 y.o. male who was consulted by [facility] for issue above.  Admitted for cellulitis, sepsis, AKI.  Started complaining of SOB and dyspnea, so CXR ordered by hospitalist.  Showed possible free air so surgery called.  Pt states pain has been unchanged for past few days, has maybe noticed increased pain with deep inspiration.  Otherwise was tolerating diet, last BM yesterday. No other specific complaints.   Past Medical History:  has a past medical history of Allergic rhinitis, Elevated blood pressure reading without diagnosis of hypertension, History of chicken pox, and Obesity.  Past Surgical History:  Past Surgical History:  Procedure Laterality Date   TONSILLECTOMY AND ADENOIDECTOMY  1985    Family History: family history includes Cancer in his mother; Coronary artery disease (age of onset: 35) in his father; Diabetes in his father and paternal grandmother; Heart disease in his mother; Hyperlipidemia in his father; Hypertension in his father; Stroke in his paternal grandmother.  Social History:  reports that he has never smoked. He has never used smokeless tobacco. He reports that he does not drink alcohol and does not use drugs.  Current Medications:  Prior to Admission medications   Medication Sig Start Date End Date Taking? Authorizing Provider  naproxen (NAPROSYN) 500 MG tablet Take 500 mg by mouth 2 (two) times daily with a meal.   Yes [provider]  amLODipine (NORVASC) 5 MG tablet Take 1 tablet (5 mg total) by mouth once daily. 09/16/20 12/15/20  Menshew, Charlesetta Ivory, PA-C    Allergies:  Allergies as of 10/09/2020   (No Known Allergies)    ROS:  General: Denies weight loss, weight gain, fatigue, fevers, chills, and night sweats. Eyes: Denies blurry vision, double vision, eye pain, itchy eyes, and tearing. Ears: Denies hearing loss, earache, and ringing in ears. Nose: Denies sinus pain,  congestion, infections, runny nose, and nosebleeds. Mouth/throat: Denies hoarseness, sore throat, bleeding gums, and difficulty swallowing. Heart: Denies chest pain, palpitations, racing heart, irregular heartbeat, leg pain or swelling, and decreased activity tolerance. Respiratory: Denies breathing difficulty, shortness of breath, wheezing, cough, and sputum. GI: Denies change in appetite, heartburn, nausea, vomiting, constipation, diarrhea, and blood in stool. GU: Denies difficulty urinating, pain with urinating, urgency, frequency, blood in urine. Musculoskeletal: Denies joint stiffness, pain, swelling, muscle weakness. Skin: Denies rash, itching, mass, tumors, sores, and boils Neurologic: Denies headache, fainting, dizziness, seizures, numbness, and tingling. Psychiatric: Denies depression, anxiety, difficulty sleeping, and memory loss. Endocrine: Denies heat or cold intolerance, and increased thirst or urination. Blood/lymph: Denies easy bruising, easy bruising, and swollen glands     Objective:     BP 120/73 (BP Location: Left Arm)   Pulse 81   Temp 98.1 F (36.7 C) (Oral)   Resp 20   Ht 6\' 3"  (1.905 m)   Wt (!) 201.6 kg   SpO2 95%   BMI 55.55 kg/m   Constitutional :  alert, cooperative, appears stated age, and no distress  Lymphatics/Throat:  no asymmetry, masses, or scars  Respiratory:  clear to auscultation bilaterally  Cardiovascular:  regular rate and rhythm  Gastrointestinal: Obese, with some minor TTP in periumbilical region, right side, reducible umbilical hernia   abdomen remain soft, with no guarding even with very deep palpation through thick abdominal wall.   Musculoskeletal: Steady movement  Skin: Cool and moist, no surgical scars   Psychiatric: Normal affect, non-agitated, not confused  LABS:  CMP Latest Ref Rng & Units 10/14/2020 10/12/2020 10/10/2020  Glucose 70 - 99 mg/dL - 026(V) 785(Y)  BUN 6 - 20 mg/dL - 7 13  Creatinine 8.50 - 1.24 mg/dL - 2.77  4.12  Sodium 878 - 145 mmol/L - 138 136  Potassium 3.5 - 5.1 mmol/L 3.6 3.2(L) 3.1(L)  Chloride 98 - 111 mmol/L - 98 99  CO2 22 - 32 mmol/L - 31 27  Calcium 8.9 - 10.3 mg/dL - 7.9(L) 8.0(L)  Total Protein 6.5 - 8.1 g/dL - - -  Total Bilirubin 0.3 - 1.2 mg/dL - - -  Alkaline Phos 38 - 126 U/L - - -  AST 15 - 41 U/L - - -  ALT 0 - 44 U/L - - -   CBC Latest Ref Rng & Units 10/12/2020 10/10/2020 10/09/2020  WBC 4.0 - 10.5 K/uL 9.2 16.8(H) 23.2(H)  Hemoglobin 13.0 - 17.0 g/dL 12.1(L) 12.7(L) 13.9  Hematocrit 39.0 - 52.0 % 39.4 39.6 42.4  Platelets 150 - 400 K/uL 427(H) 461(H) 509(H)    RADS: CLINICAL DATA:  Hypoxia and abdominal pain   EXAM: CHEST - 2 VIEW   COMPARISON:  10/09/2020, 10/10/2020   FINDINGS: Cardiac shadow is prominent but stable. The lungs are hypoinflated but clear. Some crowding of the vascular markings is noted. There is considerable free intraperitoneal air beneath the right hemidiaphragm suspicious for perforated viscus. No bony abnormality is noted.   IMPRESSION: No acute abnormality in the chest.   Findings suspicious for perforated viscus with free intraperitoneal air.   These results will be called to the ordering clinician or representative by the Radiologist Assistant, and communication documented in the PACS or [employer].     Electronically Signed   By: Alcide Clever M.D.   On: 10/15/2020 18:56 Assessment:   Abdominal pain, possible free air on  CXR. Images personally reviewed.  Plan:   Clinical exam not as concerning for free air as noted on CXR report.  Amount of air seen on exam will expect to have much more clinically concerning signs such as increasing pain, tachycardia, and more acute distress.  Pt on exam at this time has none of the above and no clear etiology of spontaneous perforation.  Images could be the dilated colon that was noted on previous CT.  Will obtain repeat STAT CT to assess further.  ADDENDUM:  CT delayed due to  old IV needing replacement and long que for STAT CT scanner.  Pt remained comfortable and denied any changes in level of pain while waiting, clinical exam no change either.  Unfortunately, CT showed large amount of air despite clinical exam stability, therefore recommended emergent surgical exploration and repair of bowel perforation. Pt agreeable and able to verbalized understanding.  Consent signed. Will attempt Robotic assisted laparoscopic exploration first due to large body habitus that will make open exploration extremely difficult. CT unable to localize exact area of perforation. Pt understand high risk of prolonged care, possible death despite all measures to prevent such outcome.  The risk of surgery include, but not limited to, recurrence, bleeding, chronic pain, post-op infxn, post-op SBO or ileus, hernias, resection of bowel, re-anastamosis, possible ostomy placement and need for re-operation to address said risks. The risks of general anesthetic, if used, includes MI, CVA, sudden death or even reaction to anesthetic medications also discussed. Alternatives include continued observation and NG decompression.  Benefits include possible symptom relief, preventing further decline in health and possible death.  Typical post-op recovery  time of additional days in hospital for observation afterwards also discussed.  The patient verbalized understanding and all questions were answered to the patient's satisfaction.

## 2020-10-16 ENCOUNTER — Inpatient Hospital Stay: Payer: Self-pay

## 2020-10-16 DIAGNOSIS — K631 Perforation of intestine (nontraumatic): Secondary | ICD-10-CM

## 2020-10-16 DIAGNOSIS — J9621 Acute and chronic respiratory failure with hypoxia: Secondary | ICD-10-CM

## 2020-10-16 DIAGNOSIS — I959 Hypotension, unspecified: Secondary | ICD-10-CM

## 2020-10-16 DIAGNOSIS — A419 Sepsis, unspecified organism: Principal | ICD-10-CM

## 2020-10-16 DIAGNOSIS — R6520 Severe sepsis without septic shock: Secondary | ICD-10-CM

## 2020-10-16 DIAGNOSIS — J9622 Acute and chronic respiratory failure with hypercapnia: Secondary | ICD-10-CM

## 2020-10-16 LAB — BLOOD GAS, ARTERIAL
Acid-Base Excess: 12 mmol/L — ABNORMAL HIGH (ref 0.0–2.0)
Acid-Base Excess: 13.8 mmol/L — ABNORMAL HIGH (ref 0.0–2.0)
Bicarbonate: 41.7 mmol/L — ABNORMAL HIGH (ref 20.0–28.0)
Bicarbonate: 41.8 mmol/L — ABNORMAL HIGH (ref 20.0–28.0)
Delivery systems: POSITIVE
Expiratory PAP: 9
FIO2: 0.3
FIO2: 0.45
Inspiratory PAP: 18
Mechanical Rate: 8
O2 Saturation: 88.9 %
O2 Saturation: 93.9 %
Patient temperature: 37
Patient temperature: 37
pCO2 arterial: 81 mmHg (ref 32.0–48.0)
pCO2 arterial: 69 mmHg (ref 32.0–48.0)
pH, Arterial: 7.32 — ABNORMAL LOW (ref 7.350–7.450)
pH, Arterial: 7.39 (ref 7.350–7.450)
pO2, Arterial: 61 mmHg — ABNORMAL LOW (ref 83.0–108.0)
pO2, Arterial: 71 mmHg — ABNORMAL LOW (ref 83.0–108.0)

## 2020-10-16 LAB — CBC
HCT: 47.4 % (ref 39.0–52.0)
Hemoglobin: 14.3 g/dL (ref 13.0–17.0)
MCH: 26.2 pg (ref 26.0–34.0)
MCHC: 30.2 g/dL (ref 30.0–36.0)
MCV: 87 fL (ref 80.0–100.0)
Platelets: 425 10*3/uL — ABNORMAL HIGH (ref 150–400)
RBC: 5.45 MIL/uL (ref 4.22–5.81)
RDW: 13.7 % (ref 11.5–15.5)
WBC: 17.3 10*3/uL — ABNORMAL HIGH (ref 4.0–10.5)
nRBC: 0 % (ref 0.0–0.2)

## 2020-10-16 LAB — BASIC METABOLIC PANEL
Anion gap: 10 (ref 5–15)
BUN: 11 mg/dL (ref 6–20)
CO2: 33 mmol/L — ABNORMAL HIGH (ref 22–32)
Calcium: 8 mg/dL — ABNORMAL LOW (ref 8.9–10.3)
Chloride: 94 mmol/L — ABNORMAL LOW (ref 98–111)
Creatinine, Ser: 0.81 mg/dL (ref 0.61–1.24)
GFR, Estimated: >60 mL/min (ref >60)
Glucose, Bld: 153 mg/dL — ABNORMAL HIGH (ref 70–99)
Potassium: 4.3 mmol/L (ref 3.5–5.1)
Sodium: 137 mmol/L (ref 135–145)

## 2020-10-16 LAB — GLUCOSE, CAPILLARY: Glucose-Capillary: 152 mg/dL — ABNORMAL HIGH (ref 70–99)

## 2020-10-16 LAB — MRSA NEXT GEN BY PCR, NASAL: MRSA by PCR Next Gen: NOT DETECTED

## 2020-10-16 LAB — MAGNESIUM: Magnesium: 2.2 mg/dL (ref 1.7–2.4)

## 2020-10-16 MED ORDER — ACETAMINOPHEN 10 MG/ML IV SOLN: 1000.0000 mg | Freq: Once | INTRAVENOUS | Status: DC | PRN

## 2020-10-16 MED ORDER — CHLORHEXIDINE GLUCONATE CLOTH 2 % EX PADS
6.0000 | MEDICATED_PAD | Freq: Every day | CUTANEOUS | Status: DC
  Administered 2020-10-17 – 2020-10-20 (×4): 6 via TOPICAL

## 2020-10-16 MED ORDER — SODIUM CHLORIDE 0.9 % IV SOLN
200.0000 mg | Freq: Once | INTRAVENOUS | Status: AC
  Administered 2020-10-16: 200 mg via INTRAVENOUS
  Filled 2020-10-16: qty 200

## 2020-10-16 MED ORDER — VASOPRESSIN 20 UNIT/ML IV SOLN
INTRAVENOUS | Status: DC | PRN
  Administered 2020-10-16 (×2): 2 [IU] via INTRAVENOUS
  Administered 2020-10-16: 1 [IU] via INTRAVENOUS

## 2020-10-16 MED ORDER — ACETAMINOPHEN 10 MG/ML IV SOLN
INTRAVENOUS | Status: AC
  Filled 2020-10-16: qty 100

## 2020-10-16 MED ORDER — SUGAMMADEX SODIUM 200 MG/2ML IV SOLN
INTRAVENOUS | Status: DC | PRN
  Administered 2020-10-16: 500 mg via INTRAVENOUS

## 2020-10-16 MED ORDER — BUPIVACAINE-MELOXICAM ER 400-12 MG/14ML IJ SOLN
INTRAMUSCULAR | Status: AC
  Filled 2020-10-16: qty 1

## 2020-10-16 MED ORDER — SODIUM CHLORIDE 0.9 % IV BOLUS
500.0000 mL | Freq: Once | INTRAVENOUS | Status: AC
  Administered 2020-10-16: 500 mL via INTRAVENOUS

## 2020-10-16 MED ORDER — ONDANSETRON HCL 4 MG/2ML IJ SOLN: 4.0000 mg | Freq: Once | INTRAMUSCULAR | Status: DC | PRN

## 2020-10-16 MED ORDER — SUCCINYLCHOLINE CHLORIDE 200 MG/10ML IV SOSY
PREFILLED_SYRINGE | INTRAVENOUS | Status: AC
  Filled 2020-10-16: qty 10

## 2020-10-16 MED ORDER — SUGAMMADEX SODIUM 500 MG/5ML IV SOLN
INTRAVENOUS | Status: AC
  Filled 2020-10-16: qty 5

## 2020-10-16 MED ORDER — DEXAMETHASONE SODIUM PHOSPHATE 10 MG/ML IJ SOLN
INTRAMUSCULAR | Status: DC | PRN
  Administered 2020-10-16: 10 mg via INTRAVENOUS

## 2020-10-16 MED ORDER — SODIUM CHLORIDE 0.9 % IV SOLN
100.0000 mg | INTRAVENOUS | Status: DC
  Administered 2020-10-17: 100 mg via INTRAVENOUS
  Filled 2020-10-16 (×2): qty 100

## 2020-10-16 MED ORDER — ROCURONIUM BROMIDE 10 MG/ML (PF) SYRINGE
PREFILLED_SYRINGE | INTRAVENOUS | Status: AC
  Filled 2020-10-16: qty 20

## 2020-10-16 MED ORDER — ONDANSETRON HCL 4 MG/2ML IJ SOLN
INTRAMUSCULAR | Status: DC | PRN
  Administered 2020-10-16: 4 mg via INTRAVENOUS

## 2020-10-16 MED ORDER — FENTANYL CITRATE (PF) 100 MCG/2ML IJ SOLN
25.0000 ug | INTRAMUSCULAR | Status: DC | PRN
  Administered 2020-10-16: 25 ug via INTRAVENOUS

## 2020-10-16 MED ORDER — 0.9 % SODIUM CHLORIDE (POUR BTL) OPTIME
TOPICAL | Status: DC | PRN
  Administered 2020-10-16: 3000 mL

## 2020-10-16 MED ORDER — FENTANYL CITRATE (PF) 100 MCG/2ML IJ SOLN
INTRAMUSCULAR | Status: AC
  Administered 2020-10-16: 25 ug via INTRAVENOUS
  Filled 2020-10-16: qty 2

## 2020-10-16 MED ORDER — BUPIVACAINE-MELOXICAM ER 400-12 MG/14ML IJ SOLN
INTRAMUSCULAR | Status: DC | PRN
  Administered 2020-10-16: 400 mg

## 2020-10-16 MED ORDER — ACETAMINOPHEN 10 MG/ML IV SOLN
INTRAVENOUS | Status: DC | PRN
  Administered 2020-10-16: 1000 mg via INTRAVENOUS

## 2020-10-16 MED ORDER — IPRATROPIUM-ALBUTEROL 0.5-2.5 (3) MG/3ML IN SOLN: 3.0000 mL | Freq: Three times a day (TID) | RESPIRATORY_TRACT | Status: DC

## 2020-10-16 MED ORDER — IPRATROPIUM-ALBUTEROL 0.5-2.5 (3) MG/3ML IN SOLN
3.0000 mL | Freq: Three times a day (TID) | RESPIRATORY_TRACT | Status: DC
  Administered 2020-10-16 – 2020-10-23 (×20): 3 mL via RESPIRATORY_TRACT
  Filled 2020-10-16 (×21): qty 3

## 2020-10-16 NOTE — Anesthesia Postprocedure Evaluation (Signed)
Anesthesia Post Note  Patient: Christian [name]  Procedure(s) Performed: diagnostic laparascopy converted to open laparotomy with repair of bowel perforation  Patient location during evaluation: ICU Anesthesia Type: General Level of consciousness: sedated Vital Signs Assessment: post-procedure vital signs reviewed and stable Respiratory status: spontaneous breathing Cardiovascular status: stable Anesthetic complications: no   No notable events documented.   Last Vitals:  Vitals:   10/16/20 0415 10/16/20 0430  BP: 122/75 (!) 90/58  Pulse: 88 87  Resp: 20 (!) 21  Temp: (!) 36.3 C 36.6 C  SpO2: 93% 95%    Last Pain:  Vitals:   10/16/20 0430  TempSrc: Oral  PainSc: 7                  Jaye Beagle

## 2020-10-16 NOTE — Consult Note (Signed)
NAME:  Christian [name], MRN:  [id number], DOB:  1976-02-03, LOS: 6 ADMISSION DATE:  10/09/2020, CONSULTATION DATE:  10/16/20  REFERRING MD:  Hilton Sinclair, CHIEF COMPLAINT:  Hypercarbia, hypoxia   History of Present Illness:  45 y.o. homeless male initially admitted on 8/12 via ED with abdominal pain and weakness. He was treated for sepsis of uncertain etiology and AKI with bilateral sacral decubitus ulcers and wound on his pannus. He was treated with broad spectrum antibiotics and managed conservatively. On 8/18, he had worsening abdominal pain and increased shortness of breath. Chest XR showed free air underneath the right hemidiaphragm. CT a/p confirmed the presence of pneumoperitoneum. He went for ex lap on 8/18 which showed purulent fluid within the entire abdominal cavity and an apparent perforation at the cecum. 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 He was successfully extubate post-op and transferred to [facility], off pressors.  Pertinent  Medical History  Super morbid obesity Obesity hypoventilation syndrome  Significant Hospital Events: Including procedures, antibiotic start and stop dates in addition to other pertinent events   8/12: admitted for abdominal pain 8/18: pneumoperitoneum discovered, ex-lap performed  Interim History / Subjective:  He reports continued abdominal pain which is improved somewhat from yesterday. Also endorses shortness of breath. He is on simple face mask this morning. Leukocytosis is increased postoperatively. AM blood gas showed acute on chronic respiratory acidosis.  Objective   Blood pressure 107/75, pulse 81, temperature 97.9 F (36.6 C), temperature source Axillary, resp. rate 20, height 6\' 3"  (1.905 m), weight (!) 201.6 kg, SpO2 94 %.        Intake/Output Summary (Last 24 hours) at 10/16/2020 1243 Last data filed at 10/16/2020 1208 Gross per 24 hour  Intake 1680 ml  Output 1585 ml  Net 95 ml    Filed Weights   10/09/20 0807 10/15/20 1600  Weight: (!) 197.3 kg (!) 201.6 kg    Examination: General: sleepy but awake, alert and appropriate HENT: pupils equal and reactive, NG tube in place Lungs: bibasilar crackles Cardiovascular: tachycardic, no M/R/G Abdomen: distended, diffusely TTP, minimal bowel sounds, midline incision, ostomy present Extremities: trace pitting edema present Neuro: alert and oriented GU: Foley in place  Resolved Hospital Problem list   Acute kidney injury  Assessment & Plan:  Sepsis 2/2 peritonitis from cecal perforation Pneumoperitoneum Acute hypoxic and hypercarbic respiratory failure Post-operative atelectasis Leukocytosis  Christian [name] has baseline hypercarbia in the setting of likely OHS given his blood gas with pCO2 of 81 and pH of 7.32, along with chronically elevated serum bicarbonate. Hypercarbia and hypoxia are exacerbated by post-op atelectasis. He does not have COPD (never smoker) and there is no compelling evidence for small airways obstruction.  - Surgery is following - Continue antibiotics for peritonitis per ID - Follow up culture data - Continue BiPAP nightly and PRN - Repeat blood gas after 1 hour on BiPAP this AM - Early mobility will be important for recovery and resolution of atelectasis - Incentive spirometry - Stop Pulmicort; can decrease Duo-Nebs to TID - SpO2 goal >=90% - Transitions of care consult given difficult living situation  Best Practice (right click and "Reselect all SmartList Selections" daily)   Diet/type: NPO DVT prophylaxis: SCD GI prophylaxis: N/A Lines: N/A Foley:  Yes, and it is still needed Code Status:  full code Last date of multidisciplinary goals of care discussion [8/19]  Labs   CBC: Recent Labs  Lab 10/10/20 0525 10/12/20 0516 10/16/20 0516  WBC  16.8* 9.2 17.3*  HGB 12.7* 12.1* 14.3  HCT 39.6 39.4 47.4  MCV 84.4 85.1 87.0  PLT 461* 427* 425*    Basic Metabolic Panel: Recent Labs   Lab 10/10/20 0525 10/12/20 0516 10/14/20 0434 10/16/20 0516  NA 136 138  --  137  K 3.1* 3.2* 3.6 4.3  CL 99 98  --  94*  CO2 27 31  --  33*  GLUCOSE 132* 103*  --  153*  BUN 13 7  --  11  CREATININE 1.13 0.99  --  0.81  CALCIUM 8.0* 7.9*  --  8.0*  MG  --   --   --  2.2   GFR: Estimated Creatinine Clearance: 211.6 mL/min (by C-G formula based on SCr of 0.81 mg/dL). Recent Labs  Lab 10/09/20 1644 10/09/20 1959 10/10/20 0525 10/12/20 0516 10/16/20 0516  WBC  --   --  16.8* 9.2 17.3*  LATICACIDVEN 1.7 1.1  --   --   --     Liver Function Tests: No results for input(s): AST, ALT, ALKPHOS, BILITOT, PROT, ALBUMIN in the last 168 hours. No results for input(s): LIPASE, AMYLASE in the last 168 hours. No results for input(s): AMMONIA in the last 168 hours.  ABG    Component Value Date/Time   PHART 7.39 10/16/2020 1125   PCO2ART 69 (HH) 10/16/2020 1125   PO2ART 71 (L) 10/16/2020 1125   HCO3 41.8 (H) 10/16/2020 1125   O2SAT 93.9 10/16/2020 1125     Coagulation Profile: Recent Labs  Lab 10/10/20 0525  INR 1.2    Cardiac Enzymes: Recent Labs  Lab 10/09/20 1644  CKTOTAL 111    HbA1C: Hgb A1c MFr Bld  Date/Time Value Ref Range Status  10/09/2020 08:11 AM 6.0 (H) 4.8 - 5.6 % Final    Comment:    (NOTE) Pre diabetes:          5.7%-6.4%  Diabetes:              >6.4%  Glycemic control for   <7.0% adults with diabetes     CBG: Recent Labs  Lab 10/09/20 2016 10/10/20 0909 10/10/20 1259 10/10/20 1647 10/16/20 0433  GLUCAP 137* 146* 131* 94 152*    Review of Systems:   Pertinent findings noted in HPI. All other systems reviewed and negative unless otherwise documented.  Past Medical History:  He,  has a past medical history of Allergic rhinitis, Elevated blood pressure reading without diagnosis of hypertension, History of chicken pox, and Obesity.   Surgical History:   Past Surgical History:  Procedure Laterality Date   TONSILLECTOMY AND  ADENOIDECTOMY  1985     Social History:   reports that he has never smoked. He has never used smokeless tobacco. He reports that he does not drink alcohol and does not use drugs.   Family History:  His family history includes Cancer in his mother; Coronary artery disease (age of onset: 23) in his father; Diabetes in his father and paternal grandmother; Heart disease in his mother; Hyperlipidemia in his father; Hypertension in his father; Stroke in his paternal grandmother.   Allergies No Known Allergies   Home Medications  Prior to Admission medications   Medication Sig Start Date End Date Taking? Authorizing Provider  naproxen (NAPROSYN) 500 MG tablet Take 500 mg by mouth 2 (two) times daily with a meal.   Yes [provider]  amLODipine (NORVASC) 5 MG tablet Take 1 tablet (5 mg total) by mouth once daily. 09/16/20  12/15/20  Menshew, Charlesetta Ivory, PA-C     Critical care time: 45 minutes

## 2020-10-16 NOTE — Transfer of Care (Signed)
Immediate Anesthesia Transfer of Care Note  Patient: Christian [name]  Procedure(s) Performed: diagnostic laparascopy converted to open laparotomy with repair of bowel perforation  Patient Location: PACU  Anesthesia Type:General  Level of Consciousness: awake, drowsy and patient cooperative  Airway & Oxygen Therapy: Patient Spontanous Breathing and Patient connected to face mask oxygen  Post-op Assessment: Report given to RN and Post -op Vital signs reviewed and stable  Post vital signs: Reviewed and stable  Last Vitals:  Vitals Value Taken Time  BP 95/73 10/16/20 0219  Temp    Pulse 93 10/16/20 0226  Resp 16 10/16/20 0226  SpO2 98 % 10/16/20 0226  Vitals shown include unvalidated device data.  Last Pain:  Vitals:   10/15/20 2131  TempSrc: Oral  PainSc:          Complications: No notable events documented.

## 2020-10-16 NOTE — Progress Notes (Signed)
PT Cancellation Note  Patient Details Name: Christian [name] MRN: [id number] DOB: 11-16-1975   Cancelled Treatment:    Reason Eval/Treat Not Completed: Other (comment) Pt noted to have moved to ICU & is now s/p diagnostic laparoscopy converted to exploratory laparotomy, primary repair of cecal perforation. Spoke with nurse who reports pt is currently on bipap & not appropriate for PT at this time. Will f/u as able & as pt is medically appropriate.   Aleda Grana, PT, DPT 10/16/20, 12:45 PM    Sandi Mariscal 10/16/2020, 12:44 PM

## 2020-10-16 NOTE — Progress Notes (Signed)
Physical Therapy Treatment Patient Details Name: Christian [name] MRN: [id number] DOB: [birth year] Today's Date: 10/16/2020    History of Present Illness Pt is a 45 y/o M who presented to ED on 10/09/20 with c/c of bilateral lower abdominal pain & c/o emesis twice that day after eating. Pt found to have abdominal wall purulent cellulitis. Pt admitted with clinical sepsis & AKI. PMH: morbid obesity, HTN    PT Comments    Pt was received in supine with HOB elevated. He is alert and oriented however lacks insight of total situation. PT/OT/RN in room and all assisted with dressing changes. Pt performs rolling for wound care Right 2 x and Left 2 x. PT/OT did assist pt to sitting EOB. Tolerated sitting EOB x ~ 4 minutes prior to hoyer lift transfer to bariatric recliner. Tolerated sitting in recliner x 1.5 hours prior to returned to bed. Pt will require extensive PT going forward. Will continue to follow and progress as pt able.   Follow Up Recommendations  SNF;Supervision/Assistance - 24 hour     Equipment Recommendations   (ongoing assessment)       Precautions / Restrictions Precautions Precautions: Fall Restrictions Weight Bearing Restrictions: No    Mobility  Bed Mobility Overal bed mobility: Needs Assistance Bed Mobility: Rolling;Supine to Sit;Sit to Supine Rolling: Min assist;Mod assist   Supine to sit: Mod assist;Max assist;+2 for physical assistance Sit to supine: Mod assist;Max assist;+2 for safety/equipment   General bed mobility comments: size wise bed deflated to allow for ease of movement (to reduce resistance)    Transfers Overall transfer level: Needs assistance   Transfers: Sit to/from Stand           General transfer comment: pt currently is unsafe to attempt standing. Will be hoyer transfer for RN staff for forseeable future    Balance Overall balance assessment: Needs assistance Sitting-balance support: Bilateral upper extremity supported;Feet  supported;Feet unsupported Sitting balance-[name]: Poor Sitting balance - Comments: pt requires constant assistance to maintain sitting balance EOB      Cognition Arousal/Alertness: Awake/alert Behavior During Therapy: WFL for tasks assessed/performed Overall Cognitive Status: Within Functional Limits for tasks assessed      General Comments: AxOx4, while pt is pleasant and agreeable to session, he is noted to have some decreased insight into what is required to maintain his general health and well-being, ex: non-chalant in his description of several episodes in which he became too weak to get to the restroom-not seeing that this would have been an appropriate time to seak medical attention             Pertinent Vitals/Pain Pain Assessment: No/denies pain           PT Goals (current goals can now be found in the care plan section) Acute Rehab PT Goals Patient Stated Goal: none stated Progress towards PT goals: Progressing toward goals    Frequency    Min 2X/week      PT Plan Current plan remains appropriate    Co-evaluation   Reason for Co-Treatment: Complexity of the patient's impairments (multi-system involvement);Necessary to address cognition/behavior during functional activity;For patient/therapist safety;To address functional/ADL transfers PT goals addressed during session: Mobility/safety with mobility;Balance;Strengthening/ROM;Proper use of DME        AM-PAC PT "6 Clicks" Mobility   Outcome Measure  Help needed turning from your back to your side while in a flat bed without using bedrails?: A Lot Help needed moving from lying on your back to sitting on the  side of a flat bed without using bedrails?: Total Help needed moving to and from a bed to a chair (including a wheelchair)?: Total Help needed standing up from a chair using your arms (e.g., wheelchair or bedside chair)?: Total Help needed to walk in hospital room?: Total Help needed climbing 3-5  steps with a railing? : Total 6 Click Score: 7    End of Session Equipment Utilized During Treatment: Oxygen Activity Tolerance: Patient tolerated treatment well Patient left: with call bell/phone within reach;in chair Nurse Communication: Mobility status PT Visit Diagnosis: Muscle weakness (generalized) (M62.81);Difficulty in walking, not elsewhere classified (R26.2)     Time: 3299-2426 PT Time Calculation (min) (ACUTE ONLY): 83 min  Charges:  $Therapeutic Activity: 23-37 mins                     Jetta Lout PTA 10/16/20, 8:34 AM

## 2020-10-16 NOTE — Progress Notes (Signed)
Upon start of shift, pt on 6L Simple Mask. Increased to 8L Simple Mask. Increased WOB and very diaphoretic.   Pt placed on BIPAP.   Markedly improved breathing status and comfort on BIPAP. Pt also less lethargic and drowsy.    Near 1800, pt placed on 6L [place] by RT.   Pt ox4, follows all commands.   Foley in place.  Pt given 500 ml NS bolus @ outset of shift d/t soft BP's per Dr. Renae Gloss. BP stable throughout shift.   Pt does not endorse pain while alert and awake.   Abd dressings appear dry and intact.   JP drain in place, putting out serosanguinous/yellow output.

## 2020-10-16 NOTE — Progress Notes (Signed)
Report received from [place]. RN from PACU for pt transfer to ICU. Belongings sent up from floor, belongings include clothing, shorts, t-shirts, underwear, shoes. Along with debit cards, drivers license, car keys, other cards. Ortho boots and multi podus boots.  Pt arrived on unit at 0430, diaphoretic, with simple mask on at 4L and stating at 90%, O2 increased to 6L, pts HR remains in th 80s and lung sounds are diminished. BP is WDL. Pt still alert and oriented

## 2020-10-16 NOTE — Plan of Care (Signed)
Tonna Boehringer MD consented pt for surgery. Report given to OR RN Selena Batten. Pt sent to PACU for holding.

## 2020-10-16 NOTE — TOC Progression Note (Signed)
Transition of Care [facility]) - Progression Note    Patient Details  Name: Christian [name] MRN: [id number] Date of Birth: [birth year]  Transition of [facility]) CM/SW Contact  Marina Goodell Phone Number: [phone] 10/16/2020, 3:01 PM  Clinical Narrative:     Pt noted to have moved to ICU & is now s/p diagnostic laparoscopy converted to exploratory laparotomy, primary repair of cecal perforation. Patient is currently on Bipap and not appropriate for PT/OT evaluation. Main barrier to discharge; the patient is uninsured does not qualify for Medicaid or disability benefits. Main contact Ritter,Bruce (Friend) [phone] [facility] Phone).    Barriers to Discharge: Continued Medical Work up  Expected Discharge Plan and Services         Living arrangements for the past 2 months: Homeless                                       Social Determinants of Health (SDOH) Interventions    Readmission Risk Interventions Readmission Risk Prevention Plan 10/11/2020  Post Dischage Appt Complete  Medication Screening Complete  Transportation Screening Complete  Some recent data might be hidden

## 2020-10-16 NOTE — Progress Notes (Signed)
OT Cancellation Note  Patient Details Name: Christian [name] MRN: [id number] DOB: 07-16-1975   Cancelled Treatment:    Reason Eval/Treat Not Completed: Medical issues which prohibited therapy Pt with possible free air visualized on XR yesterday, then CT completed showed pneumoperitoneum suspicious for bowel perforation. Pt now s/p diagnostic laparoscopy converted to exploratory laparotomy, primary repair of cecal perforation and t/f to ICU. RN reports pt currently on BiPap. Will f/u for OT re-evaluation at later date/time as pt becomes more appropriate. Thank you.  Rejeana Brock, MS, OTR/L ascom [phone] 10/16/20, 12:58 PM

## 2020-10-16 NOTE — Progress Notes (Signed)
Patient ID: Christian [name], male   DOB: 1975-07-29, 45 y.o.   MRN: [id number] [facility] PROGRESS NOTE  Karis Emig NLZ:[id number] DOB: 03-Sep-1975 DOA: 10/09/2020 PCP: Pcp, No  HPI/Subjective: Patient had cecal perforation repair overnight.  Last night's chest x-ray showed free air under the diaphragm.  Patient having some abdominal pain.  Had some upper airway congestion after extubation after the surgery.  Complains of abdominal pain.  Blood pressure little low this morning and antihypertensive medications were held.  Initially admitted with lower abdominal cellulitis.  Objective: Vitals:   10/16/20 1300 10/16/20 1330  BP: 114/80 101/72  Pulse: 82 81  Resp: 19 (!) 22  Temp:    SpO2: 93% 93%    Intake/Output Summary (Last 24 hours) at 10/16/2020 1409 Last data filed at 10/16/2020 1208 Gross per 24 hour  Intake 1440 ml  Output 1585 ml  Net -145 ml   Filed Weights   10/09/20 0807 10/15/20 1600  Weight: (!) 197.3 kg (!) 201.6 kg    ROS: Review of Systems  Respiratory:  Positive for shortness of breath.   Cardiovascular:  Negative for chest pain.  Gastrointestinal:  Positive for abdominal pain. Negative for nausea and vomiting.  Exam: Physical Exam HENT:     Head: Normocephalic.     Mouth/Throat:     Pharynx: No oropharyngeal exudate.  Eyes:     General: Lids are normal.     Conjunctiva/sclera: Conjunctivae normal.  Cardiovascular:     Rate and Rhythm: Normal rate and regular rhythm.     Heart sounds: Normal heart sounds, S1 normal and S2 normal.  Pulmonary:     Breath sounds: Examination of the right-lower field reveals decreased breath sounds and rhonchi. Examination of the left-lower field reveals decreased breath sounds and rhonchi. Decreased breath sounds and rhonchi present. No wheezing or rales.  Abdominal:     Palpations: Abdomen is soft.     Tenderness: There is abdominal tenderness.  Musculoskeletal:     Right ankle: Swelling present.     Left  ankle: Swelling present.  Skin:    General: Skin is warm.  Neurological:     Mental Status: He is alert.      Scheduled Meds:  Chlorhexidine Gluconate Cloth  6 each Topical Daily   collagenase   Topical Daily   fluconazole  200 mg Oral Q24H   ipratropium-albuterol  3 mL Nebulization TID   potassium chloride  20 mEq Oral BID   Continuous Infusions:  piperacillin-tazobactam (ZOSYN)  IV 3.375 g (10/16/20 1208)    Assessment/Plan:  Acute on chronic hypoxic hypercarbic respiratory failure.  Blood gas today showed a PCO2 of 81 and patient was placed on BiPAP.  Likely obesity hypoventilation syndrome. Cecal perforation status post operative repair by Dr. Tonna Boehringer.  Patient n.p.o. and has NG tube in.  Empiric Zosyn.  White blood cell count up to 17.3 today. Hypotension this morning.  Fluid bolus given.  Holding off on antihypertensive medications Severe sepsis, present on admission with acute kidney injury and abdominal wall cellulitis with bilateral lower extremity buttock wounds.  Patient on oral fluconazole and Keflex switched over to Zosyn.  Please see full description below of the wounds and buttock and heels. Morbid obesity with sleep apnea. Acute kidney injury.  Creatinine peaked at 1.43 and improved to 0.81.  Pressure Injury 10/09/20 Buttocks Left Unstageable - Full thickness tissue loss in which the base of the injury is covered by slough (yellow, tan, gray, green or brown) and/or  eschar (tan, brown or black) in the wound bed. (Active)  10/09/20   Location: Buttocks  Location Orientation: Left  Staging: Unstageable - Full thickness tissue loss in which the base of the injury is covered by slough (yellow, tan, gray, green or brown) and/or eschar (tan, brown or black) in the wound bed.  Wound Description (Comments):   Present on Admission: Yes     Pressure Injury 10/09/20 Buttocks Right Unstageable - Full thickness tissue loss in which the base of the injury is covered by slough  (yellow, tan, gray, green or brown) and/or eschar (tan, brown or black) in the wound bed. (Active)  10/09/20   Location: Buttocks  Location Orientation: Right  Staging: Unstageable - Full thickness tissue loss in which the base of the injury is covered by slough (yellow, tan, gray, green or brown) and/or eschar (tan, brown or black) in the wound bed.  Wound Description (Comments):   Present on Admission: Yes     Pressure Injury 10/09/20 Heel Left Stage 2 -  Partial thickness loss of dermis presenting as a shallow open injury with a red, pink wound bed without slough. (Active)  10/09/20   Location: Heel  Location Orientation: Left  Staging: Stage 2 -  Partial thickness loss of dermis presenting as a shallow open injury with a red, pink wound bed without slough.  Wound Description (Comments):   Present on Admission: Yes     Pressure Injury 10/09/20 Heel Right Stage 2 -  Partial thickness loss of dermis presenting as a shallow open injury with a red, pink wound bed without slough. (Active)  10/09/20   Location: Heel  Location Orientation: Right  Staging: Stage 2 -  Partial thickness loss of dermis presenting as a shallow open injury with a red, pink wound bed without slough.  Wound Description (Comments):   Present on Admission: Yes     Pressure Injury Foot Right Stage 2 -  Partial thickness loss of dermis presenting as a shallow open injury with a red, pink wound bed without slough. (Active)     Location: Foot  Location Orientation: Right  Staging: Stage 2 -  Partial thickness loss of dermis presenting as a shallow open injury with a red, pink wound bed without slough.  Wound Description (Comments):   Present on Admission: Yes       Code Status:     Code Status Orders  (From admission, onward)           Start     Ordered   10/09/20 1235  Full code  Continuous        10/09/20 1236           Code Status History     This patient has a current code status but no  historical code status.       Disposition Plan: Status is: Inpatient  Dispo: The patient is from: Lives in his car              Anticipated d/c is to: To be determined              Patient currently postoperative day 1 for cecal perforation   Difficult to place patient.  No.  Consultants: General surgery Critical care specialist  Procedures: Cecal perforation repair  Antibiotics: Zosyn  Time spent: 28 minutes, case discussed with general surgery and critical care specialist  Aliviana Burdell [facility]  [facility]

## 2020-10-16 NOTE — Op Note (Signed)
Preoperative diagnosis: Pneumoperitoneum  Postoperative diagnosis: Cecal perforation  Procedure: Diagnostic laparoscopy converted to exploratory laparotomy, primary repair of cecal perforation  Anesthesia: GETA  Surgeon: Tonna Boehringer  Specimen: Peritoneal fluid for culture Complications: None  EBL:  Wound Classification: Clean Contaminated  Indications: See H&P for further detail  Description of procedure: The patient was taken to the operating room and placed in the supine position. General endotracheal anesthesia was induced without any difficulty. A time-out was completed verifying correct patient, procedure, site, positioning, and implant(s) and/or special equipment prior to beginning this procedure.  A Foley catheter and an NG tube were placed. Preoperative antibiotics continued from floor.   Palmer's point chosen to insert Veress needle and after confirming to place an audible and visible release of air through the needle, insufflation started up to 15 mmHg.  Afterwards Optiview technique was used to the same point entering the abdominal cavity.  Examination of the area noted no injury during insertion.  Inspection of the abdominal cavity noted extensive thick omentum covering the entire abdominal cavity with barely visible bowel contents as expected.  2 additional 5 mm trochars were placed to facilitate mobilization of this omentum using laparoscopic graspers, but this attempt was unsuccessful in visualizing adequate bowel to assess for the location of perforation.  Decision was made at this point to convert to an exploratory laparotomy.  Trochars were removed and midline incision was made in the supraumbilical area down through the thick abdominal wall up to the fascia and abdominal cavity entered under direct visualization.  An additional hour was spent attempting to visualize bowel contents due to patient's extreme habitus inhibiting exposure.  In addition to the thick omentum, there  was a large amount of purulent fluid, consistent with infection in the entire abdominal cavity.  Cultures were taken.  Initially the stomach was inspected and there is no obvious injury to the area. Next, colon was inspected and noted to be extremely dilated but viable.  Adequate visualization of the right and left colon was inhibited by the dilated transverse colon, so colotomy was made to decompress the transverse colon temporarily.  The colotomy was then closed with 3-0 silk x1 after decompression.  No additional spillage was noted during this portion of procedure.  Upon further inspection, there was increased inflammation and adhesions towards the right lower quadrant in the area of the cecum.  Enseal was used to remove adhesions to this area and a 1 cm perforation was noted at the cecum.  There was active feculent drainage from that area.  This area was reinforced with 3-0 silk.  Inspection and palpation of the remaining colon and small bowel did not show any additional pathology or cause of this extremely dilated colon and pneumoperitoneum purulent drainage.  15 [place] Blake drain placed within the right lower quadrant and pelvis area through the and secured to skin using 3-0 nylon. decision was made at this point to close the abdominal cavity.   The abdomen was copiously irrigated with normal saline until clear return noted.  Hemostasis was checked The fascia was closed with 1-PDS x2. Zinrelef infused around incision site. The skin was closed with interrupted 3-0 Vicryl for the subdermal layer and then staples for the skin.  3 port sites were also closed with staples.  Incisions all dressed with honeycomb dressing, drain dressing placed around drain.  The patient tolerated the procedure well and was extubated and taken to the postoperative care unit in stable condition accom- panied by the surgical and  anesthesia teams.

## 2020-10-16 NOTE — Progress Notes (Signed)
Pt taken off bipap and placed on 6lpm [place], sats 93%, tolerating well at this time.

## 2020-10-16 NOTE — Plan of Care (Signed)
MD at bedside. New IV access obtained. Pt going to CT

## 2020-10-16 NOTE — Progress Notes (Signed)
Subjective:  CC: Christian [name] is a 45 y.o. male  Hospital stay day 6, 1 Day Post-Op ex-lap cecal perforation repair  HPI: On Bipap, complaining of dry mouth.  ROS:  General: Denies weight loss, weight gain, fatigue, fevers, chills, and night sweats. Heart: Denies chest pain, palpitations, racing heart, irregular heartbeat, leg pain or swelling, and decreased activity tolerance. Respiratory: Denies breathing difficulty, shortness of breath, wheezing, cough, and sputum. GI: Denies change in appetite, heartburn, nausea, vomiting, constipation, diarrhea, and blood in stool. GU: Denies difficulty urinating, pain with urinating, urgency, frequency, blood in urine.   Objective:   Temp:  [97.3 F (36.3 C)-98.5 F (36.9 C)] 97.9 F (36.6 C) (08/19 0800) Pulse Rate:  [81-105] 81 (08/19 1330) Resp:  [0-26] 22 (08/19 1330) BP: (73-155)/(54-137) 101/72 (08/19 1330) SpO2:  [82 %-96 %] 93 % (08/19 1330) Weight:  [201.6 kg] 201.6 kg (08/18 1600)     Height: 6\' 3"  (190.5 cm) Weight: (!) 201.6 kg BMI (Calculated): 55.55   Intake/Output this shift:   Intake/Output Summary (Last 24 hours) at 10/16/2020 1340 Last data filed at 10/16/2020 1208 Gross per 24 hour  Intake 1440 ml  Output 1585 ml  Net -145 ml    Constitutional :  alert, cooperative, appears stated age, and no distress  Respiratory:  clear to auscultation bilaterally  Cardiovascular:  regular rate and rhythm  Gastrointestinal: Soft, no guarding, some TTP at incision, dressing c/d/i  JP with serosanguinous discharge, NG with light bilious.   Skin: Cool and moist.   Psychiatric: Normal affect, non-agitated, not confused       LABS:  CMP Latest Ref Rng & Units 10/16/2020 10/14/2020 10/12/2020  Glucose 70 - 99 mg/dL 10/14/2020) - 259(D)  BUN 6 - 20 mg/dL 11 - 7  Creatinine 638(V - 1.24 mg/dL 5.64 - 3.32  Sodium 9.51 - 145 mmol/L 137 - 138  Potassium 3.5 - 5.1 mmol/L 4.3 3.6 3.2(L)  Chloride 98 - 111 mmol/L 94(L) - 98  CO2 22 - 32  mmol/L 33(H) - 31  Calcium 8.9 - 10.3 mg/dL 8.0(L) - 7.9(L)  Total Protein 6.5 - 8.1 g/dL - - -  Total Bilirubin 0.3 - 1.2 mg/dL - - -  Alkaline Phos 38 - 126 U/L - - -  AST 15 - 41 U/L - - -  ALT 0 - 44 U/L - - -   CBC Latest Ref Rng & Units 10/16/2020 10/12/2020 10/10/2020  WBC 4.0 - 10.5 K/uL 17.3(H) 9.2 16.8(H)  Hemoglobin 13.0 - 17.0 g/dL 10/12/2020 12.1(L) 12.7(L)  Hematocrit 39.0 - 52.0 % 47.4 39.4 39.6  Platelets 150 - 400 K/uL 425(H) 427(H) 461(H)    RADS: N/a Assessment:   S/p cecal perforation repair. Await return of bowel function prior to starting diet.  CPM

## 2020-10-17 DIAGNOSIS — E662 Morbid (severe) obesity with alveolar hypoventilation: Secondary | ICD-10-CM | POA: Diagnosis present

## 2020-10-17 LAB — CBC
HCT: 36.7 % — ABNORMAL LOW (ref 39.0–52.0)
Hemoglobin: 11.3 g/dL — ABNORMAL LOW (ref 13.0–17.0)
MCH: 26.6 pg (ref 26.0–34.0)
MCHC: 30.8 g/dL (ref 30.0–36.0)
MCV: 86.4 fL (ref 80.0–100.0)
Platelets: 393 10*3/uL (ref 150–400)
RBC: 4.25 MIL/uL (ref 4.22–5.81)
RDW: 13.6 % (ref 11.5–15.5)
WBC: 14.5 10*3/uL — ABNORMAL HIGH (ref 4.0–10.5)
nRBC: 0 % (ref 0.0–0.2)

## 2020-10-17 LAB — BASIC METABOLIC PANEL
Anion gap: 9 (ref 5–15)
BUN: 15 mg/dL (ref 6–20)
CO2: 38 mmol/L — ABNORMAL HIGH (ref 22–32)
Calcium: 8 mg/dL — ABNORMAL LOW (ref 8.9–10.3)
Chloride: 96 mmol/L — ABNORMAL LOW (ref 98–111)
Creatinine, Ser: 0.85 mg/dL (ref 0.61–1.24)
GFR, Estimated: >60 mL/min (ref >60)
Glucose, Bld: 138 mg/dL — ABNORMAL HIGH (ref 70–99)
Potassium: 4.2 mmol/L (ref 3.5–5.1)
Sodium: 143 mmol/L (ref 135–145)

## 2020-10-17 LAB — MAGNESIUM: Magnesium: 2.4 mg/dL (ref 1.7–2.4)

## 2020-10-17 LAB — PHOSPHORUS: Phosphorus: 2.8 mg/dL (ref 2.5–4.6)

## 2020-10-17 MED ORDER — HEPARIN SODIUM (PORCINE) 10000 UNIT/ML IJ SOLN
7500.0000 [IU] | Freq: Three times a day (TID) | INTRAMUSCULAR | Status: DC
  Administered 2020-10-17 – 2020-10-21 (×13): 7500 [IU] via SUBCUTANEOUS
  Filled 2020-10-17 (×15): qty 1

## 2020-10-17 NOTE — Progress Notes (Signed)
Physical Therapy Treatment Patient Details Name: Christian [name] MRN: [id number] DOB: Aug 22, 1975 Today's Date: 10/17/2020    History of Present Illness Pt is a 45 y/o M who presented to ED on 10/09/20 with c/c of bilateral lower abdominal pain & c/o emesis twice that day after eating. Pt found to have abdominal wall purulent cellulitis. Pt admitted with clinical sepsis & AKI. PMH: morbid obesity, HTN    PT Comments    Pt received supine in bed. Agreeable to PT services resting on 6L/min O2 via [place]. Pt does have continue on tranfers orders. See below for vitals trend throughout session. Prior to attempting OOB mobility pt performed ankle pumps and B shoulder flexion to promote circulation and read muscles for attempting bed mobility. Overall pt declining in status with regards to functional mobility. With [facility] elevated, and multimodal cuing with MaxA+2, pt unable to roll on L side in attempts to sit EOB. Able to maintain L side lying for ~30-45 sec before pt returned to supine due to weakness and PT safety concerns to successfully sit pt upright. Total A+2 with bed in supine to scoot pt up in bed. Prior to abdominal laparotomy, pt was min<> ModA+2 for rolling and mod <> MaxA+2 to sit EOB, requiring hoyer lift to bariatric recliner for seated OOB. Goals updated to reflect current level of functional mobility to match realistic outcomes. Overall, PT recs remain the same that pt will benefit from extensive rehab to improve independence with overall functional mobility with reduced reliance on outside personnel as pt was ambulating short distances although with difficulty, before current admission.   Vitals:  Prior to PT: 107/68 mm Hg, 90% on 6 L/min, 85 BPM  Post LE therex: 109/74 mm Hg, SPO2: 91%, 94 BPM  Post bed mobility: SPO2 87-88% despite pursed lip breathing, HR mid 80's.   Follow Up Recommendations  SNF;Supervision/Assistance - 24 hour     Equipment Recommendations  Other (comment)  (ongoing; next venue of care)    Recommendations for Other Services       Precautions / Restrictions Precautions Precautions: Fall Restrictions Weight Bearing Restrictions: No    Mobility  Bed Mobility Overal bed mobility: Needs Assistance Bed Mobility: Rolling Rolling: Max assist;+2 for physical assistance;+2 for safety/equipment         General bed mobility comments: Firmed bed for ease in mobility. Attempts to go from rolling to sitting EOBn with HOB elevated maximally but pt unable to perform even with +2 maxA    Transfers                 General transfer comment: Deferred due to inability to sit EOB  Ambulation/Gait             General Gait Details: Deferred   Stairs             Wheelchair Mobility    Modified Rankin (Stroke Patients Only)       Balance Overall balance assessment: Needs assistance Sitting-balance support: Bilateral upper extremity supported Sitting balance-[name]: Zero Sitting balance - Comments: Pt required +2MaxA to maintain side lying with attempts to sit EOb. Unable to assist enough with 2 person assist to become seated upright.     Standing balance-[name]: Zero Standing balance comment: Treatment spent performing bed mobility and supine therex                            Cognition Arousal/Alertness: Awake/alert Behavior During Therapy:  WFL for tasks assessed/performed Overall Cognitive Status: Within Functional Limits for tasks assessed                                        Exercises General Exercises - Upper Extremity Shoulder Flexion: AROM;Strengthening;Both;10 reps;Supine General Exercises - Lower Extremity Ankle Circles/Pumps: AROM;Strengthening;Both;10 reps Other Exercises Other Exercises: MaxA +2 to perform rolling in bed to L in attempts to sit EOB.    General Comments General comments (skin integrity, edema, etc.): Pt resting on 6L/min with SPO2 trending  90-92%. Post session, pt trending at 88% despite pursed lip breathing. RN notified.      Pertinent Vitals/Pain Faces Pain Scale: Hurts little more Pain Location: ABdomen surgical site Pain Descriptors / Indicators: Grimacing Pain Intervention(s): Limited activity within patient's tolerance;Monitored during session;Repositioned    Home Living                      Prior Function            PT Goals (current goals can now be found in the care plan section) Acute Rehab PT Goals Patient Stated Goal: none stated Time For Goal Achievement: 10/25/20 Potential to Achieve Goals: Poor Progress towards PT goals: Goals downgraded-see care plan;Not progressing toward goals - comment    Frequency    Min 2X/week      PT Plan Current plan remains appropriate    Co-evaluation              AM-PAC PT "6 Clicks" Mobility   Outcome Measure  Help needed turning from your back to your side while in a flat bed without using bedrails?: Total Help needed moving from lying on your back to sitting on the side of a flat bed without using bedrails?: Total Help needed moving to and from a bed to a chair (including a wheelchair)?: Total Help needed standing up from a chair using your arms (e.g., wheelchair or bedside chair)?: Total Help needed to walk in hospital room?: Total Help needed climbing 3-5 steps with a railing? : Total 6 Click Score: 6    End of Session Equipment Utilized During Treatment: Oxygen Activity Tolerance: Patient tolerated treatment well Patient left: with call bell/phone within reach;in chair Nurse Communication: Mobility status PT Visit Diagnosis: Muscle weakness (generalized) (M62.81);Difficulty in walking, not elsewhere classified (R26.2)     Time: 0375-4360 PT Time Calculation (min) (ACUTE ONLY): 24 min  Charges:  $Therapeutic Activity: 23-37 mins                    Margerite Impastato M. Fairly IV, PT, DPT Physical Therapist- [place]  [facility]  10/17/2020, 11:58 AM

## 2020-10-17 NOTE — Progress Notes (Signed)
Subjective:  CC: Christian [name] is a 45 y.o. male  Hospital stay day 7, 2 Days Post-Op ex-lap cecal perforation repair  HPI: Currently on nasal cannula oxygen, states abdominal pain is localized to incision.  Not passing flatus.  NG tube with a liter out.  While I am seeing him, his oxygen level drops to 87 to 88%.  ROS:  General: Denies weight loss, weight gain, fatigue, fevers, chills, and night sweats. Heart: Denies chest pain, palpitations, racing heart, irregular heartbeat, leg pain or swelling, and decreased activity tolerance. Respiratory: Denies breathing difficulty, shortness of breath, wheezing, cough, and sputum. GI: Denies change in appetite, heartburn, nausea, vomiting, constipation, diarrhea, and blood in stool. GU: Denies difficulty urinating, pain with urinating, urgency, frequency, blood in urine.   Objective:   Temp:  [98.1 F (36.7 C)-99.1 F (37.3 C)] 99.1 F (37.3 C) (08/20 0200) Pulse Rate:  [81-98] 88 (08/20 1200) Resp:  [12-32] 15 (08/20 1200) BP: (86-122)/(52-80) 116/77 (08/20 1200) SpO2:  [87 %-95 %] 95 % (08/20 1200)     Height: 6\' 3"  (190.5 cm) Weight: (!) 201.6 kg BMI (Calculated): 55.55   Intake/Output this shift:   Intake/Output Summary (Last 24 hours) at 10/17/2020 1240 Last data filed at 10/17/2020 1200 Gross per 24 hour  Intake 109.86 ml  Output 2720 ml  Net -2610.14 ml     Constitutional :  alert, cooperative, appears stated age, and no distress  Respiratory:  clear to auscultation bilaterally  Cardiovascular:  regular rate and rhythm  Gastrointestinal: Soft, no guarding, some TTP at incision, dressing c/d/i  JP with serosanguinous discharge, NG with light bilious.   Skin: Cool and moist.   Psychiatric: Normal affect, non-agitated, not confused       LABS:  CMP Latest Ref Rng & Units 10/17/2020 10/16/2020 10/14/2020  Glucose 70 - 99 mg/dL 10/16/2020) 505(L) -  BUN 6 - 20 mg/dL 15 11 -  Creatinine 976(B - 1.24 mg/dL 3.41 9.37 -  Sodium 9.02 -  145 mmol/L 143 137 -  Potassium 3.5 - 5.1 mmol/L 4.2 4.3 3.6  Chloride 98 - 111 mmol/L 96(L) 94(L) -  CO2 22 - 32 mmol/L 38(H) 33(H) -  Calcium 8.9 - 10.3 mg/dL 8.0(L) 8.0(L) -  Total Protein 6.5 - 8.1 g/dL - - -  Total Bilirubin 0.3 - 1.2 mg/dL - - -  Alkaline Phos 38 - 126 U/L - - -  AST 15 - 41 U/L - - -  ALT 0 - 44 U/L - - -   CBC Latest Ref Rng & Units 10/17/2020 10/16/2020 10/12/2020  WBC 4.0 - 10.5 K/uL 14.5(H) 17.3(H) 9.2  Hemoglobin 13.0 - 17.0 g/dL 11.3(L) 14.3 12.1(L)  Hematocrit 39.0 - 52.0 % 36.7(L) 47.4 39.4  Platelets 150 - 400 K/uL 393 425(H) 427(H)    RADS: N/a Assessment:   S/p cecal perforation repair. Await return of bowel function prior to starting diet. OK to leave ICU from a surgical perspective, but probably needs monitoring for oxygen desaturation issues.

## 2020-10-17 NOTE — Evaluation (Addendum)
Occupational Therapy Re-Evaluation Patient Details Name: Christian [name] MRN: [id number] DOB: [birth year] Today's Date: 10/17/2020    History of Present Illness Pt is a 45 y/o M who presented to ED on 10/09/20 with c/c of bilateral lower abdominal pain & c/o emesis twice that day after eating. Pt found to have abdominal wall purulent cellulitis. Pt admitted with clinical sepsis & AKI. PMH: morbid obesity, HTN. Pt with increased SOB starting 8/18 and CXR ordered with concern for free air. CT showed pneumoperitoneum suspicious for bowel perforation. Pt now s/p diagnostic laparoscopy converted to exploratory laparotomy, primary repair of cecal perforation and t/f to ICU on 8/19.   Clinical Impression   Pt seen for OT re-evaluation this date in setting of prolonged hospitalization complicated by transfer to ICU after abdominal procedure. Pt presents this date laying in size-wise bed with member of his church visiting at the bed side. Pt reports some abdominal pain post-op'ly in addition to his baseline weakness and severely decreased fxl activity tolerance. This date, pt requires MAX A +2 for bed level rolling for repositioning. On ADL assessment, based on clinical observation, pt requires: MIN/MOD A with bed level UB bathing/dressing, SETUP for UB grooming and self-feeding, MAX/TOTAL A for bed level LB ADLs using rolling technique with 2p assist. OT engages pt in oral care and ed re: importance of oral care as it pertains to reduction of risk for infection. Pt with good understanding.   OT spends increased time offering therapeutic listening as well as examining pt's personal goals for therapy and discussing what kind of level of work would be needed to attain them. OT engages pt in SAR x12 per side and upward reaching with attempt to sit up off elevated bed x1 per side to improve skills for bed mobility. Lastly, OT engages pt in internal hip rotation x10 per side simultaneously in and effort to improve  strength of inner thigh for stabilization.   Will continue to follow acutely. At this time, anticipate that pt will require STR f/u OT services to improve strength and INDEP as it pertains to the safe completion of ADLs/ADL mobility.    Follow Up Recommendations  SNF;Supervision/Assistance - 24 hour    Equipment Recommendations  Other (comment) (TBD next venue)    Recommendations for Other Services       Precautions / Restrictions        Mobility Bed Mobility Overal bed mobility: Needs Assistance Bed Mobility: Rolling Rolling: Max assist;+2 for physical assistance;+2 for safety/equipment         General bed mobility comments: MAX A +2 rolling for repositioning    Transfers                 General transfer comment: deferred, unsafe/unable    Balance                                           ADL either performed or assessed with clinical judgement   ADL Overall ADL's : Needs assistance/impaired                                       General ADL Comments: MIN/MOD A with bed level UB bathing/dressing, SETUP for UB grooming and self-feeding, MAX/TOTAL A for bed level LB ADLs using rolling technique with 2p assist.  Vision Baseline Vision/History: Wears glasses Wears Glasses: At all times Patient Visual Report: No change from baseline       Perception     Praxis      Pertinent Vitals/Pain Pain Assessment: Faces Faces Pain Scale: Hurts little more Pain Location: ABdomen surgical site Pain Descriptors / Indicators: Grimacing Pain Intervention(s): Monitored during session;Limited activity within patient's tolerance     Hand Dominance Right   Extremity/Trunk Assessment Upper Extremity Assessment Upper Extremity Assessment: Generalized weakness   Lower Extremity Assessment Lower Extremity Assessment: Generalized weakness       Communication Communication Communication: No difficulties   Cognition  Arousal/Alertness: Awake/alert Behavior During Therapy: WFL for tasks assessed/performed Overall Cognitive Status: Within Functional Limits for tasks assessed                                 General Comments: pt is more talkative with therapist this date, reporting that one of his goals is to be more positive and he thinks that will help in all aspects of his health. Had visit from a member of his church this date.   General Comments       Exercises Other Exercises Other Exercises: OT spends increased time offering therapeutic listening as well as examining pt's personal goals for therapy and discussing what kind of level of work would be needed to attain them. OT engages pt in SAR x12 per side and upward reaching with attempt to sit up off elevated bed x1 per side to improve skills for bed mobility. Lastly, OT engages pt in internal hip rotation x10 per side simultaneously in and effort to improve strength of inner thigh for stabilization.   Shoulder Instructions      Home Living Family/patient expects to be discharged to:: Unsure                                 Additional Comments: Pt has been homeless and living in care since June      Prior Functioning/Environment          Comments: Pt reports he had occasional jobs but only for 1-2 weeks at a time. Last job was at [employer] but he could only tolerate standing for 15 minutes before becoming "winded" & needing to sit down. Pt reports he has a "bad habit of laying around". Pt also reports hx of plantar fasciitis and while living in his car it was easier to void bowels/bladder on himself than it was to walk to a bathroom to use toilet 2/2 foot pain. Pt reports he recently joined a [employer] to have somewhere to shower but was unable to ambulate into building.        OT Problem List: Decreased strength;Decreased activity tolerance;Impaired balance (sitting and/or standing);Decreased safety  awareness;Cardiopulmonary status limiting activity;Pain;Obesity      OT Treatment/Interventions: Self-care/ADL training;Modalities;Balance training;Therapeutic exercise;Therapeutic activities;Energy conservation;DME and/or AE instruction;Patient/family education    OT Goals(Current goals can be found in the care plan section) Acute Rehab OT Goals Patient Stated Goal: none stated OT Goal Formulation: With patient Time For Goal Achievement: 10/31/20 Potential to Achieve Goals: Good ADL Goals Pt Will Perform Lower Body Dressing: with mod assist;sit to/from stand;with adaptive equipment Pt Will Transfer to Toilet: with mod assist;with +2 assist;squat pivot transfer;bedside commode  OT Frequency: Min 2X/week   Barriers to D/C: Decreased caregiver support  homeless with  very limited family support. Has a cousin in [place] that checks on him occasionally, has a sister who he essentially does not have a close relationship with, and has church members that have check on him since hospitalization       Co-evaluation              AM-PAC OT "6 Clicks" Daily Activity     Outcome Measure Help from another person eating meals?: None Help from another person taking care of personal grooming?: None Help from another person toileting, which includes using toliet, bedpan, or urinal?: Total Help from another person bathing (including washing, rinsing, drying)?: A Lot Help from another person to put on and taking off regular upper body clothing?: A Lot Help from another person to put on and taking off regular lower body clothing?: Total 6 Click Score: 14   End of Session Equipment Utilized During Treatment: Oxygen Nurse Communication: Mobility status  Activity Tolerance: Patient tolerated treatment well;Treatment limited secondary to medical complications (Comment) Patient left: with call bell/phone within reach;in bed;with bed alarm set  OT Visit Diagnosis: Unsteadiness on feet (R26.81);Muscle  weakness (generalized) (M62.81)                Time: 7939-0300 OT Time Calculation (min): 36 min Charges:  OT General Charges $OT Visit: 1 Visit OT Evaluation $OT Re-eval: 1 Re-eval OT Treatments $Self Care/Home Management : 8-22 mins $Therapeutic Exercise: 8-22 mins  Rejeana Brock, MS, OTR/L ascom [phone] 10/17/20, 4:08 PM

## 2020-10-17 NOTE — Progress Notes (Signed)
Pt placed on HFNC w/ bubbler this AM. O2 increased from 8 to 10 L.   Pt's sats 88-92% on 10 L.   Foley remains in place - 1 liter out clear amber JP remains in place - 200 out, serosanguinous NG remains @ LIWS - 700 out bile/brown color  Abd dressings and wound dressings remain in place.

## 2020-10-17 NOTE — Progress Notes (Signed)
NAME:  Christian [name], MRN:  [id number], DOB:  Oct 24, 1975, LOS: 7 ADMISSION DATE:  10/09/2020, CONSULTATION DATE:  10/17/20  REFERRING MD:  Hilton Sinclair, CHIEF COMPLAINT:  Hypercarbia, hypoxia   History of Present Illness:  45 y.o. homeless male initially admitted on 8/12 via ED with abdominal pain and weakness. He was treated for sepsis of uncertain etiology and AKI with bilateral sacral decubitus ulcers and wound on his pannus. He was treated with broad spectrum antibiotics and managed conservatively. On 8/18, he had worsening abdominal pain and increased shortness of breath. Chest XR showed free air underneath the right hemidiaphragm. CT a/p confirmed the presence of pneumoperitoneum. He went for ex lap on 8/18 which showed purulent fluid within the entire abdominal cavity and an apparent perforation at the cecum. 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 He was successfully extubate post-op and transferred to [facility], off pressors.  Pertinent  Medical History  Super morbid obesity Obesity hypoventilation syndrome  Significant Hospital Events: Including procedures, antibiotic start and stop dates in addition to other pertinent events   8/12: admitted for abdominal pain 8/18: pneumoperitoneum discovered, ex-lap performed 8/19: required BiPAP for acute on chronic hypercarbia post-operatively  Interim History / Subjective:  Wore BiPAP for some of the night. Leukocytosis downtrending. Working down on FiO2. Hemodynamics have improved.  Objective   Blood pressure 116/71, pulse 88, temperature 99.1 F (37.3 C), temperature source Oral, resp. rate (!) 21, height 6\' 3"  (1.905 m), weight (!) 201.6 kg, SpO2 92 %.       Intake/Output Summary (Last 24 hours) at 10/17/2020 0710 Last data filed at 10/17/2020 0600 Gross per 24 hour  Intake 109.86 ml  Output 2770 ml  Net -2660.14 ml   Filed Weights   10/09/20 0807 10/15/20 1600  Weight: (!) 197.3 kg (!)  201.6 kg    Examination: General: awake, alert and appropriate HENT: pupils equal and reactive, NG tube in place Lungs: bibasilar crackles Cardiovascular: RRR, no M/R/G Abdomen: distended, diffusely TTP, minimal bowel sounds, midline incision, ostomy present Extremities: trace pitting edema present Neuro: alert and oriented GU: Foley in place  Resolved Hospital Problem list   Acute kidney injury  Assessment & Plan:  Sepsis 2/2 peritonitis from cecal perforation Pneumoperitoneum Acute hypoxic and hypercarbic respiratory failure Post-operative atelectasis Leukocytosis, improving  [name] has baseline hypercarbia in the setting of likely OHS given his blood gas with pCO2 of 81 and pH of 7.32, along with chronically elevated serum bicarbonate. Hypercarbia and hypoxia are exacerbated by post-op atelectasis. He does not have COPD (never smoker) and there is no compelling evidence for small airways obstruction.  - Surgery is following - Continue antibiotics for peritonitis, including anaerobic and antifungal coverage - Consider engaging ID for duration - Follow up culture data - Continue BiPAP nightly and PRN - Early mobility will be important for recovery and resolution of atelectasis - Incentive spirometry - OK to continue Duo-Nebs to TID - SpO2 goal >=90% - Transitions of care consult given difficult living situation - Place referral to Sleep Medicine upon discharge  Thank you for the consultation. PCCM will sign off. Please reengage if there are any further questions that arise.  Best Practice (right click and "Reselect all SmartList Selections" daily)   Diet/type: NPO; advancement per Surgery DVT prophylaxis: prophylactic heparin  GI prophylaxis: N/A Lines: N/A Foley:  Yes, and it is still needed Code Status:  full code Last date of multidisciplinary goals of care discussion [8/19]  Labs   CBC: Recent Labs  Lab 10/12/20 0516 10/16/20 0516 10/17/20 0452  WBC 9.2  17.3* 14.5*  HGB 12.1* 14.3 11.3*  HCT 39.4 47.4 36.7*  MCV 85.1 87.0 86.4  PLT 427* 425* 393    Basic Metabolic Panel: Recent Labs  Lab 10/12/20 0516 10/14/20 0434 10/16/20 0516 10/17/20 0452  NA 138  --  137 143  K 3.2* 3.6 4.3 4.2  CL 98  --  94* 96*  CO2 31  --  33* 38*  GLUCOSE 103*  --  153* 138*  BUN 7  --  11 15  CREATININE 0.99  --  0.81 0.85  CALCIUM 7.9*  --  8.0* 8.0*  MG  --   --  2.2 2.4  PHOS  --   --   --  2.8   GFR: Estimated Creatinine Clearance: 201.7 mL/min (by C-G formula based on SCr of 0.85 mg/dL). Recent Labs  Lab 10/12/20 0516 10/16/20 0516 10/17/20 0452  WBC 9.2 17.3* 14.5*    Liver Function Tests: No results for input(s): AST, ALT, ALKPHOS, BILITOT, PROT, ALBUMIN in the last 168 hours. No results for input(s): LIPASE, AMYLASE in the last 168 hours. No results for input(s): AMMONIA in the last 168 hours.  ABG    Component Value Date/Time   PHART 7.39 10/16/2020 1125   PCO2ART 69 (HH) 10/16/2020 1125   PO2ART 71 (L) 10/16/2020 1125   HCO3 41.8 (H) 10/16/2020 1125   O2SAT 93.9 10/16/2020 1125     Coagulation Profile: No results for input(s): INR, PROTIME in the last 168 hours.   Cardiac Enzymes: No results for input(s): CKTOTAL, CKMB, CKMBINDEX, TROPONINI in the last 168 hours.   HbA1C: Hgb A1c MFr Bld  Date/Time Value Ref Range Status  10/09/2020 08:11 AM 6.0 (H) 4.8 - 5.6 % Final    Comment:    (NOTE) Pre diabetes:          5.7%-6.4%  Diabetes:              >6.4%  Glycemic control for   <7.0% adults with diabetes     CBG: Recent Labs  Lab 10/10/20 0909 10/10/20 1259 10/10/20 1647 10/16/20 0433  GLUCAP 146* 131* 94 152*    Review of Systems:   Pertinent findings noted in HPI. All other systems reviewed and negative unless otherwise documented.  Past Medical History:  He,  has a past medical history of Allergic rhinitis, Elevated blood pressure reading without diagnosis of hypertension, History of chicken  pox, and Obesity.   Surgical History:   Past Surgical History:  Procedure Laterality Date   TONSILLECTOMY AND ADENOIDECTOMY  1985     Social History:   reports that he has never smoked. He has never used smokeless tobacco. He reports that he does not drink alcohol and does not use drugs.   Family History:  His family history includes Cancer in his mother; Coronary artery disease (age of onset: 83) in his father; Diabetes in his father and paternal grandmother; Heart disease in his mother; Hyperlipidemia in his father; Hypertension in his father; Stroke in his paternal grandmother.   Allergies No Known Allergies   Home Medications  Prior to Admission medications   Medication Sig Start Date End Date Taking? Authorizing Provider  naproxen (NAPROSYN) 500 MG tablet Take 500 mg by mouth 2 (two) times daily with a meal.   Yes [provider]  amLODipine (NORVASC) 5 MG tablet Take 1 tablet (5 mg total) by  mouth once daily. 09/16/20 12/15/20  Menshew, Charlesetta Ivory, PA-C     Nevea Spiewak Laney Pastor, MD 10/17/20 7:11 AM

## 2020-10-17 NOTE — Progress Notes (Signed)
Patient ID: Christian [name], male   DOB: 1975/08/30, 45 y.o.   MRN: [id number] [facility] PROGRESS NOTE  Juma Oxley CZY:[id number] DOB: 1975/06/29 DOA: 10/09/2020 PCP: Pcp, No  HPI/Subjective: Patient complaining of dry mouth.  States his abdominal pain is a little bit better.  Has not passed gas or had any bowel movements.  Still has NG tube in.  Admitted initially with lower abdominal cellulitis and buttock wounds.  During the hospital course had a perforated cecum requiring operative repair.  Objective: Vitals:   10/17/20 1130 10/17/20 1200  BP: 115/74 116/77  Pulse: 88 88  Resp: 18 15  Temp:    SpO2: 90% 95%    Intake/Output Summary (Last 24 hours) at 10/17/2020 1303 Last data filed at 10/17/2020 1200 Gross per 24 hour  Intake 109.86 ml  Output 2720 ml  Net -2610.14 ml   Filed Weights   10/09/20 0807 10/15/20 1600  Weight: (!) 197.3 kg (!) 201.6 kg    ROS: Review of Systems  Respiratory:  Positive for cough and shortness of breath.   Cardiovascular:  Negative for chest pain.  Gastrointestinal:  Positive for abdominal pain. Negative for nausea and vomiting.  Exam: Physical Exam HENT:     Head: Normocephalic.     Mouth/Throat:     Pharynx: No oropharyngeal exudate.  Eyes:     General: Lids are normal.     Conjunctiva/sclera: Conjunctivae normal.     Pupils: Pupils are equal, round, and reactive to light.  Cardiovascular:     Rate and Rhythm: Normal rate and regular rhythm.     Heart sounds: Normal heart sounds, S1 normal and S2 normal.  Pulmonary:     Breath sounds: Examination of the right-lower field reveals decreased breath sounds and rhonchi. Examination of the left-lower field reveals decreased breath sounds and rhonchi. Decreased breath sounds and rhonchi present. No wheezing or rales.  Abdominal:     Palpations: Abdomen is soft.     Tenderness: There is generalized abdominal tenderness.  Musculoskeletal:     Right lower leg: Swelling present.      Left lower leg: Swelling present.  Skin:    General: Skin is warm.     Findings: No rash.  Neurological:     Mental Status: He is alert and oriented to person, place, and time.      Scheduled Meds:  Chlorhexidine Gluconate Cloth  6 each Topical Daily   collagenase   Topical Daily   fluconazole  200 mg Oral Q24H   heparin injection (subcutaneous)  7,500 Units Subcutaneous Q8H   ipratropium-albuterol  3 mL Nebulization TID   potassium chloride  20 mEq Oral BID   Continuous Infusions:  anidulafungin     piperacillin-tazobactam (ZOSYN)  IV 3.375 g (10/17/20 1158)    Assessment/Plan:  Acute on chronic hypoxic hypercarbic respiratory failure.  BiPAP at night.  On high flow nasal cannula 10 L now. Cecal perforation status post operative repair by Dr. Norman Herrlich.  Patient still has NG tube in and NPO.  Empiric Zosyn.  White blood cell count trending better to 14.5. Hypotension.  Holding off antihypertensive medications. Severe sepsis, present on admission with acute kidney injury and abdominal wall cellulitis and bilateral lower extremity wounds on the buttock.  Antibiotic switched over to Zosyn.  On oral fluconazole also.  Full description of buttock and leg wounds below. Morbid obesity with sleep apnea Acute kidney injury.  Creatinine peaked at 1.43 and came down to 0.85. Weakness.  Continue  physical therapy evaluation  Pressure Injury 10/09/20 Buttocks Left Unstageable - Full thickness tissue loss in which the base of the injury is covered by slough (yellow, tan, gray, green or brown) and/or eschar (tan, brown or black) in the wound bed. (Active)  10/09/20   Location: Buttocks  Location Orientation: Left  Staging: Unstageable - Full thickness tissue loss in which the base of the injury is covered by slough (yellow, tan, gray, green or brown) and/or eschar (tan, brown or black) in the wound bed.  Wound Description (Comments):   Present on Admission: Yes     Pressure Injury  10/09/20 Buttocks Right Unstageable - Full thickness tissue loss in which the base of the injury is covered by slough (yellow, tan, gray, green or brown) and/or eschar (tan, brown or black) in the wound bed. (Active)  10/09/20   Location: Buttocks  Location Orientation: Right  Staging: Unstageable - Full thickness tissue loss in which the base of the injury is covered by slough (yellow, tan, gray, green or brown) and/or eschar (tan, brown or black) in the wound bed.  Wound Description (Comments):   Present on Admission: Yes     Pressure Injury 10/09/20 Heel Left Stage 2 -  Partial thickness loss of dermis presenting as a shallow open injury with a red, pink wound bed without slough. (Active)  10/09/20   Location: Heel  Location Orientation: Left  Staging: Stage 2 -  Partial thickness loss of dermis presenting as a shallow open injury with a red, pink wound bed without slough.  Wound Description (Comments):   Present on Admission: Yes     Pressure Injury 10/09/20 Heel Right Stage 2 -  Partial thickness loss of dermis presenting as a shallow open injury with a red, pink wound bed without slough. (Active)  10/09/20   Location: Heel  Location Orientation: Right  Staging: Stage 2 -  Partial thickness loss of dermis presenting as a shallow open injury with a red, pink wound bed without slough.  Wound Description (Comments):   Present on Admission: Yes     Pressure Injury Foot Right Stage 2 -  Partial thickness loss of dermis presenting as a shallow open injury with a red, pink wound bed without slough. (Active)     Location: Foot  Location Orientation: Right  Staging: Stage 2 -  Partial thickness loss of dermis presenting as a shallow open injury with a red, pink wound bed without slough.  Wound Description (Comments):   Present on Admission: Yes       Code Status:     Code Status Orders  (From admission, onward)           Start     Ordered   10/09/20 1235  Full code   Continuous        10/09/20 1236           Code Status History     This patient has a current code status but no historical code status.      Disposition Plan: Status is: Inpatient  Dispo: The patient is from: Homeless              Anticipated d/c is to: To be determined              Patient currently still on quite a bit of oxygen and not stable for disposition at this point.  Still has NG tube in.   Difficult to place patient.  No.  Consultants: General surgery  Procedures: -  Cecal perforation repair  Antibiotics: Zosyn  Time spent: 27 minutes  [employer]

## 2020-10-18 LAB — BASIC METABOLIC PANEL
Anion gap: 9 (ref 5–15)
BUN: 16 mg/dL (ref 6–20)
CO2: 33 mmol/L — ABNORMAL HIGH (ref 22–32)
Calcium: 8 mg/dL — ABNORMAL LOW (ref 8.9–10.3)
Chloride: 99 mmol/L (ref 98–111)
Creatinine, Ser: 0.81 mg/dL (ref 0.61–1.24)
GFR, Estimated: >60 mL/min (ref >60)
Glucose, Bld: 103 mg/dL — ABNORMAL HIGH (ref 70–99)
Potassium: 4 mmol/L (ref 3.5–5.1)
Sodium: 141 mmol/L (ref 135–145)

## 2020-10-18 LAB — MAGNESIUM: Magnesium: 2.3 mg/dL (ref 1.7–2.4)

## 2020-10-18 NOTE — Plan of Care (Signed)
Neuro: A&O, delayed responses, flat affect, moves weakly in bed, patient completed all turning in bed with very little assistance Resp: stable on HFNC  CV: afebrile, vital signs fairly stable, generalized edema GIGU:foley in place-urine turned pink/red tinged following pt requesting to lay on left side-no complaints per patient (Mds notified via secure chat), NG to LIS, tolerating ice chips and sips with meds, passing gas, no BM Skin: dry and flaky, wounds assessed, dressing changes deferred to night shift due to bath assigned to night shift Social: No contact with friends of family  Problem: Education: Goal: Knowledge of General Education information will improve Description: Including pain rating scale, medication(s)/side effects and non-pharmacologic comfort measures Outcome: Not Progressing   Problem: Health Behavior/Discharge Planning: Goal: Ability to manage health-related needs will improve Outcome: Not Progressing   Problem: Clinical Measurements: Goal: Ability to maintain clinical measurements within normal limits will improve Outcome: Not Progressing Goal: Will remain free from infection Outcome: Not Progressing Goal: Diagnostic test results will improve Outcome: Not Progressing Goal: Respiratory complications will improve Outcome: Not Progressing Goal: Cardiovascular complication will be avoided Outcome: Not Progressing   Problem: Activity: Goal: Risk for activity intolerance will decrease Outcome: Not Progressing   Problem: Nutrition: Goal: Adequate nutrition will be maintained Outcome: Not Progressing   Problem: Coping: Goal: Level of anxiety will decrease Outcome: Not Progressing   Problem: Elimination: Goal: Will not experience complications related to bowel motility Outcome: Not Progressing Goal: Will not experience complications related to urinary retention Outcome: Not Progressing   Problem: Pain Managment: Goal: General experience of comfort will  improve Outcome: Not Progressing   Problem: Safety: Goal: Ability to remain free from injury will improve Outcome: Not Progressing   Problem: Skin Integrity: Goal: Risk for impaired skin integrity will decrease Outcome: Not Progressing

## 2020-10-18 NOTE — Progress Notes (Signed)
Patient ID: Christian [name], male   DOB: Dec 12, 1975, 45 y.o.   MRN: [id number] [facility] PROGRESS NOTE  Kazuo Durnil WUJ:[id number] DOB: [birth year] DOA: 10/09/2020 PCP: Pcp, No  HPI/Subjective: Patient asking how long his treatment course is going to going on for.  Patient states his abdominal pain is a little bit less.  He is asking when he can eat.  Initially admitted with sepsis secondary to lower abdominal and buttock cellulitis.  Patient had perforated cecum requiring operative repair.  This morning still had NG tube in.  Objective: Vitals:   10/18/20 0800 10/18/20 1000  BP: 126/79 106/74  Pulse: 89 89  Resp: (!) 23 (!) 21  Temp:  98.7 F (37.1 C)  SpO2: 96% 95%    Intake/Output Summary (Last 24 hours) at 10/18/2020 1212 Last data filed at 10/18/2020 1000 Gross per 24 hour  Intake 331.57 ml  Output 3875 ml  Net -3543.43 ml   Filed Weights   10/09/20 0807 10/15/20 1600  Weight: (!) 197.3 kg (!) 201.6 kg    ROS: Review of Systems  Respiratory:  Positive for shortness of breath.   Cardiovascular:  Negative for chest pain.  Gastrointestinal:  Positive for abdominal pain. Negative for nausea and vomiting.  Exam: Physical Exam HENT:     Head: Normocephalic.     Mouth/Throat:     Pharynx: No oropharyngeal exudate.  Eyes:     General: Lids are normal.     Conjunctiva/sclera: Conjunctivae normal.  Cardiovascular:     Rate and Rhythm: Normal rate and regular rhythm.     Heart sounds: Normal heart sounds, S1 normal and S2 normal.  Pulmonary:     Breath sounds: Examination of the right-lower field reveals decreased breath sounds and rhonchi. Examination of the left-lower field reveals decreased breath sounds and rhonchi. Decreased breath sounds and rhonchi present. No wheezing or rales.  Abdominal:     Palpations: Abdomen is soft.     Tenderness: There is generalized abdominal tenderness.  Musculoskeletal:     Right lower leg: Swelling present.     Left lower  leg: Swelling present.  Skin:    General: Skin is warm.  Neurological:     Mental Status: He is alert.     Comments: Answer some questions appropriately      Scheduled Meds:  Chlorhexidine Gluconate Cloth  6 each Topical Daily   collagenase   Topical Daily   heparin injection (subcutaneous)  7,500 Units Subcutaneous Q8H   ipratropium-albuterol  3 mL Nebulization TID   potassium chloride  20 mEq Oral BID   Continuous Infusions:  anidulafungin Stopped (10/17/20 1944)   piperacillin-tazobactam (ZOSYN)  IV Stopped (10/18/20 9562)    Assessment/Plan:  Acute on chronic hypoxic hypercarbic respiratory failure.  BiPAP at night.  Currently on high flow nasal cannula tapered from 10 L down to 8 L.   Cecal perforation postoperative day 3.  General surgery to decide when to take out the NG tube and start diet.  This was explained to the patient.  Empiric Zosyn Hypotension resolved Severe sepsis, present on admission with acute kidney injury and abdominal wall cellulitis and bilateral lower extremity buttock wounds and cellulitis.  Initially was on Keflex and Diflucan.  Now on Zosyn.  Can likely stop antifungal at this point.  Lower extremity wounds.  See description below. Morbid obesity with sleep apnea Acute kidney injury.  Creatinine peaked at 1.43 and improved to 0.81  Pressure Injury 10/09/20 Buttocks Left Unstageable - Full  thickness tissue loss in which the base of the injury is covered by slough (yellow, tan, gray, green or brown) and/or eschar (tan, brown or black) in the wound bed. (Active)  10/09/20   Location: Buttocks  Location Orientation: Left  Staging: Unstageable - Full thickness tissue loss in which the base of the injury is covered by slough (yellow, tan, gray, green or brown) and/or eschar (tan, brown or black) in the wound bed.  Wound Description (Comments):   Present on Admission: Yes     Pressure Injury 10/09/20 Buttocks Right Unstageable - Full thickness tissue loss  in which the base of the injury is covered by slough (yellow, tan, gray, green or brown) and/or eschar (tan, brown or black) in the wound bed. (Active)  10/09/20   Location: Buttocks  Location Orientation: Right  Staging: Unstageable - Full thickness tissue loss in which the base of the injury is covered by slough (yellow, tan, gray, green or brown) and/or eschar (tan, brown or black) in the wound bed.  Wound Description (Comments):   Present on Admission: Yes     Pressure Injury 10/09/20 Heel Left Stage 2 -  Partial thickness loss of dermis presenting as a shallow open injury with a red, pink wound bed without slough. (Active)  10/09/20   Location: Heel  Location Orientation: Left  Staging: Stage 2 -  Partial thickness loss of dermis presenting as a shallow open injury with a red, pink wound bed without slough.  Wound Description (Comments):   Present on Admission: Yes     Pressure Injury 10/09/20 Heel Right Stage 2 -  Partial thickness loss of dermis presenting as a shallow open injury with a red, pink wound bed without slough. (Active)  10/09/20   Location: Heel  Location Orientation: Right  Staging: Stage 2 -  Partial thickness loss of dermis presenting as a shallow open injury with a red, pink wound bed without slough.  Wound Description (Comments):   Present on Admission: Yes     Pressure Injury Foot Right Stage 2 -  Partial thickness loss of dermis presenting as a shallow open injury with a red, pink wound bed without slough. (Active)     Location: Foot  Location Orientation: Right  Staging: Stage 2 -  Partial thickness loss of dermis presenting as a shallow open injury with a red, pink wound bed without slough.  Wound Description (Comments):   Present on Admission: Yes       Code Status:     Code Status Orders  (From admission, onward)           Start     Ordered   10/09/20 1235  Full code  Continuous        10/09/20 1236           Code Status History      This patient has a current code status but no historical code status.       Disposition Plan: Status is: Inpatient  Dispo: The patient is from: Homeless              Anticipated d/c is to: To be determined              Patient currently on high flow nasal cannula, postoperative day 3 for cecal perforation.  Not stable for disposition.   Difficult to place patient.  Yes.  Consultants: General surgery  Procedures: Cecal rupture repair  Antibiotics: Zosyn  Time spent: 28 minutes, case discussed with nursing staff  Ubly  [facility] [employer]

## 2020-10-18 NOTE — Progress Notes (Signed)
Subjective:  CC: Christian [name] is a 45 y.o. male  Hospital stay day 8, 3 Days Post-Op ex-lap cecal perforation repair  HPI: Continues to require substantial support for his respiratory issues.  NG with 1.2 L out yesterday and roughly 600 in the canister while I am evaluating him today.  JP drains with 300 cc recorded yesterday.  He does report that he has passed a small amount of flatus.  Leukocytosis improving.  ROS:  General: Denies weight loss, weight gain, fatigue, fevers, chills, and night sweats. Heart: Denies chest pain, palpitations, racing heart, irregular heartbeat, leg pain or swelling, and decreased activity tolerance. Respiratory: Denies breathing difficulty, shortness of breath, wheezing, cough, and sputum. GI: Denies change in appetite, heartburn, nausea, vomiting, constipation, diarrhea, and blood in stool. GU: Denies difficulty urinating, pain with urinating, urgency, frequency, blood in urine.   Objective:   Temp:  [98.5 F (36.9 C)-98.7 F (37.1 C)] 98.5 F (36.9 C) (08/21 1500) Pulse Rate:  [83-94] 86 (08/21 1400) Resp:  [13-25] 19 (08/21 1400) BP: (106-134)/(68-82) 117/79 (08/21 1400) SpO2:  [90 %-96 %] 92 % (08/21 1400) FiO2 (%):  [40 %] 40 % (08/21 0400)     Height: 6\' 3"  (190.5 cm) Weight: (!) 201.6 kg BMI (Calculated): 55.55   Intake/Output this shift:   Intake/Output Summary (Last 24 hours) at 10/18/2020 1623 Last data filed at 10/18/2020 1400 Gross per 24 hour  Intake 349.43 ml  Output 4740 ml  Net -4390.57 ml     Constitutional :  alert, cooperative, appears stated age, and no distress  Respiratory:  clear to auscultation bilaterally  Cardiovascular:  regular rate and rhythm  Gastrointestinal: Soft, no guarding, some TTP at incision, dressing c/d/i  JP with serosanguinous discharge, NG with light bilious drainage  Skin: Cool and moist.   Psychiatric: Normal affect, non-agitated, not confused       LABS:  CMP Latest Ref Rng & Units 10/18/2020  10/17/2020 10/16/2020  Glucose 70 - 99 mg/dL 10/18/2020) 413(K) 440(N)  BUN 6 - 20 mg/dL 16 15 11   Creatinine 0.61 - 1.24 mg/dL 027(O 5.36  Sodium 135 - 145 mmol/L 141 143 137  Potassium 3.5 - 5.1 mmol/L 4.0 4.2 4.3  Chloride 98 - 111 mmol/L 99 96(L) 94(L)  CO2 22 - 32 mmol/L 33(H) 38(H) 33(H)  Calcium 8.9 - 10.3 mg/dL 8.0(L) 8.0(L) 8.0(L)  Total Protein 6.5 - 8.1 g/dL - - -  Total Bilirubin 0.3 - 1.2 mg/dL - - -  Alkaline Phos 38 - 126 U/L - - -  AST 15 - 41 U/L - - -  ALT 0 - 44 U/L - - -   CBC Latest Ref Rng & Units 10/17/2020 10/16/2020 10/12/2020  WBC 4.0 - 10.5 K/uL 14.5(H) 17.3(H) 9.2  Hemoglobin 13.0 - 17.0 g/dL 11.3(L) 14.3 12.1(L)  Hematocrit 39.0 - 52.0 % 36.7(L) 47.4 39.4  Platelets 150 - 400 K/uL 393 425(H) 427(H)    RADS: N/a Assessment:   S/p cecal perforation repair.  Continue to monitor NG tube output and return of bowel function.  Continues to require significant respiratory support and remains in the ICU as a result.

## 2020-10-18 NOTE — Consult Note (Signed)
Pharmacy Antibiotic Note  Christian [name] is a 45 y.o. male admitted on 10/09/2020 with sepsis.  CXR today was concerning for free air. Pharmacy has been consulted for Zosyn dosing. -Patient had perforated cecum requiring operative repair, POD 3  Plan: Zosyn 3.375 g extended infusion q8h  Monitor clinical picture and renal function F/U C&S, abx deescalation / LOT  Height: 6\' 3"  (190.5 cm) Weight: (!) 201.6 kg (444 lb 6.4 oz) IBW/kg (Calculated) : 84.5  Temp (24hrs), Avg:98.7 F (37.1 C), Min:98.7 F (37.1 C), Max:98.8 F (37.1 C)  Recent Labs  Lab 10/12/20 0105 10/12/20 0516 10/16/20 0516 10/17/20 0452 10/18/20 0648  WBC  --  9.2 17.3* 14.5*  --   CREATININE  --  0.99 0.81 0.85 0.81  VANCOTROUGH 13*  --   --   --   --      Estimated Creatinine Clearance: 211.6 mL/min (by C-G formula based on SCr of 0.81 mg/dL).    No Known Allergies  Antimicrobials this admission: 8/12 ceftriaxone x1 8/12 vancomycin >> 8/15 8/13 fluconazone >> 8/19 8/15 keflex >> 8/18 Anidulafungin 8/19 >> 8/21 Zosyn 8/18 >>  Dose adjustments this admission: N/A  Microbiology results: 8/12 BCx: NGx5D 8/12 GI panel: negative 8/12 C diff: negative 8/15, 8/19 MRSA PCR: negative 8/18 wound cx NGx2d  Thank you for allowing pharmacy to be a part of this patient's care.  Yasenia Reedy A, PharmD 10/18/2020 3:07 PM

## 2020-10-19 LAB — CBC WITH DIFFERENTIAL/PLATELET
Abs Immature Granulocytes: 0.11 10*3/uL — ABNORMAL HIGH (ref 0.00–0.07)
Basophils Absolute: 0.1 10*3/uL (ref 0.0–0.1)
Basophils Relative: 1 %
Eosinophils Absolute: 0.5 10*3/uL (ref 0.0–0.5)
Eosinophils Relative: 5 %
HCT: 42.3 % (ref 39.0–52.0)
Hemoglobin: 12.9 g/dL — ABNORMAL LOW (ref 13.0–17.0)
Immature Granulocytes: 1 %
Lymphocytes Relative: 15 %
Lymphs Abs: 1.4 10*3/uL (ref 0.7–4.0)
MCH: 26.1 pg (ref 26.0–34.0)
MCHC: 30.5 g/dL (ref 30.0–36.0)
MCV: 85.5 fL (ref 80.0–100.0)
Monocytes Absolute: 1 10*3/uL (ref 0.1–1.0)
Monocytes Relative: 11 %
Neutro Abs: 6.1 10*3/uL (ref 1.7–7.7)
Neutrophils Relative %: 67 %
Platelets: 440 10*3/uL — ABNORMAL HIGH (ref 150–400)
RBC: 4.95 MIL/uL (ref 4.22–5.81)
RDW: 14.1 % (ref 11.5–15.5)
WBC: 9.1 10*3/uL (ref 4.0–10.5)
nRBC: 0 % (ref 0.0–0.2)

## 2020-10-19 LAB — BASIC METABOLIC PANEL
Anion gap: 6 (ref 5–15)
BUN: 15 mg/dL (ref 6–20)
CO2: 32 mmol/L (ref 22–32)
Calcium: 8.2 mg/dL — ABNORMAL LOW (ref 8.9–10.3)
Chloride: 102 mmol/L (ref 98–111)
Creatinine, Ser: 0.89 mg/dL (ref 0.61–1.24)
GFR, Estimated: >60 mL/min (ref >60)
Glucose, Bld: 101 mg/dL — ABNORMAL HIGH (ref 70–99)
Potassium: 4.4 mmol/L (ref 3.5–5.1)
Sodium: 140 mmol/L (ref 135–145)

## 2020-10-19 LAB — MAGNESIUM: Magnesium: 2.6 mg/dL — ABNORMAL HIGH (ref 1.7–2.4)

## 2020-10-19 MED ORDER — FUROSEMIDE 20 MG PO TABS
20.0000 mg | ORAL_TABLET | Freq: Every day | ORAL | Status: DC
  Administered 2020-10-19 – 2020-10-22 (×4): 20 mg via ORAL
  Filled 2020-10-19 (×4): qty 1

## 2020-10-19 NOTE — Progress Notes (Signed)
Subjective:  CC: Christian [name] is a 45 y.o. male  Hospital stay day 9, 4 Days Post-Op ex-lap cecal perforation repair  HPI: No acute issues overnight. Still reports passing more flatus.  ROS:  General: Denies weight loss, weight gain, fatigue, fevers, chills, and night sweats. Heart: Denies chest pain, palpitations, racing heart, irregular heartbeat, leg pain or swelling, and decreased activity tolerance. Respiratory: Denies breathing difficulty, shortness of breath, wheezing, cough, and sputum. GI: Denies change in appetite, heartburn, nausea, vomiting, constipation, diarrhea, and blood in stool. GU: Denies difficulty urinating, pain with urinating, urgency, frequency, blood in urine.   Objective:   Temp:  [97.8 F (36.6 C)-98.7 F (37.1 C)] 97.8 F (36.6 C) (08/22 0400) Pulse Rate:  [79-89] 86 (08/22 0800) Resp:  [15-25] 23 (08/22 0800) BP: (106-139)/(56-91) 123/70 (08/22 0800) SpO2:  [92 %-98 %] 92 % (08/22 0800)     Height: 6\' 3"  (190.5 cm) Weight: (!) 201.6 kg BMI (Calculated): 55.55   Intake/Output this shift:   Intake/Output Summary (Last 24 hours) at 10/19/2020 0816 Last data filed at 10/19/2020 10/21/2020 Gross per 24 hour  Intake 76.06 ml  Output 4035 ml  Net -3958.94 ml    Constitutional :  alert, cooperative, appears stated age, and no distress  Respiratory:  clear to auscultation bilaterally  Cardiovascular:  regular rate and rhythm  Gastrointestinal: Soft, no guarding, some TTP at incision, dressing c/d/i  JP with serosanguinous discharge, NG with light bilious.   Skin: Cool and moist.   Psychiatric: Normal affect, non-agitated, not confused       LABS:  CMP Latest Ref Rng & Units 10/19/2020 10/18/2020 10/17/2020  Glucose 70 - 99 mg/dL 10/19/2020) 875(I) 433(I)  BUN 6 - 20 mg/dL 15 16 15   Creatinine 0.61 - 1.24 mg/dL 951(O 8.41  Sodium 135 - 145 mmol/L 140 141 143  Potassium 3.5 - 5.1 mmol/L 4.4 4.0 4.2  Chloride 98 - 111 mmol/L 102 99 96(L)  CO2 22 - 32  mmol/L 32 33(H) 38(H)  Calcium 8.9 - 10.3 mg/dL 8.2(L) 8.0(L) 8.0(L)  Total Protein 6.5 - 8.1 g/dL - - -  Total Bilirubin 0.3 - 1.2 mg/dL - - -  Alkaline Phos 38 - 126 U/L - - -  AST 15 - 41 U/L - - -  ALT 0 - 44 U/L - - -   CBC Latest Ref Rng & Units 10/17/2020 10/16/2020 10/12/2020  WBC 4.0 - 10.5 K/uL 14.5(H) 17.3(H) 9.2  Hemoglobin 13.0 - 17.0 g/dL 11.3(L) 14.3 12.1(L)  Hematocrit 39.0 - 52.0 % 36.7(L) 47.4 39.4  Platelets 150 - 400 K/uL 393 425(H) 427(H)    RADS: N/a Assessment:   S/p cecal perforation repair. Stable from GI standpoint.  Will do NG clamp trial and see if we can advance diet.  Further respiratory care per primary team.

## 2020-10-19 NOTE — Progress Notes (Signed)
Physical Therapy Treatment Patient Details Name: Christian [name] MRN: [id number] DOB: 08-Aug-1975 Today's Date: 10/19/2020    History of Present Illness Pt is a 45 y/o M who presented to ED on 10/09/20 with c/c of bilateral lower abdominal pain & c/o emesis twice that day after eating. Pt found to have abdominal wall purulent cellulitis. Pt admitted with clinical sepsis & AKI. PMH: morbid obesity, HTN. Pt with increased SOB starting 8/18 and CXR ordered with concern for free air. CT showed pneumoperitoneum suspicious for bowel perforation. Pt now s/p diagnostic laparoscopy converted to exploratory laparotomy, primary repair of cecal perforation and t/f to ICU on 8/19.    PT Comments    Patient alert, agreeable to PT. Denied R sided rib pain that was present last session. Did express abdominal pain once sitting EOB due to recent surgery. Supine <> sit maxA2-4 person assist. Pt able to help, size wise bed increasing difficulty of transfer surface due bed inflation. Pt fatigued quickly and cited pain, ultimately maxA-totalA to maintain sitting after 10-15 seconds. Returned to supine and repositioned and several LE exercises performed. Pt educated and agreeable to perform LE as able. Recommendation remains appropriate at this time due to deconditioning and level of assistance needed.     Follow Up Recommendations  SNF;Supervision/Assistance - 24 hour     Equipment Recommendations  Other (comment) (TBD at next venue of care)    Recommendations for Other Services       Precautions / Restrictions Precautions Precautions: Fall Restrictions Weight Bearing Restrictions: No    Mobility  Bed Mobility Overal bed mobility: Needs Assistance Bed Mobility: Rolling;Supine to Sit;Sit to Supine Rolling: Mod assist   Supine to sit: Max assist;+2 for physical assistance Sit to supine: Max assist;+2 for physical assistance (extra assist for safety, size wise bed difficulties)        Transfers                  General transfer comment: deferred, unsafe/unable  Ambulation/Gait                 Stairs             Wheelchair Mobility    Modified Rankin (Stroke Patients Only)       Balance Overall balance assessment: Needs assistance Sitting-balance support: Bilateral upper extremity supported   Sitting balance - Comments: maxAx2-3 to transition to EOB. able to reach and grab bed rails, but due to abdominal pain, needed almost complete assist after 10-15 seconds.                                    Cognition Arousal/Alertness: Awake/alert Behavior During Therapy: WFL for tasks assessed/performed Overall Cognitive Status: Within Functional Limits for tasks assessed                                        Exercises Other Exercises Other Exercises: supine quad sets, ankle pumps, SAQ, hip IR/ER, physical assist as needed x10 bilaterally. shoulder flexion bilaterally x10    General Comments        Pertinent Vitals/Pain Pain Assessment: No/denies pain    Home Living                      Prior Function  PT Goals (current goals can now be found in the care plan section) Progress towards PT goals: Progressing toward goals    Frequency    Min 2X/week      PT Plan Current plan remains appropriate    Co-evaluation PT/OT/SLP Co-Evaluation/Treatment: Yes Reason for Co-Treatment: Complexity of the patient's impairments (multi-system involvement);Necessary to address cognition/behavior during functional activity;For patient/therapist safety;To address functional/ADL transfers PT goals addressed during session: Mobility/safety with mobility;Balance;Strengthening/ROM OT goals addressed during session: Strengthening/ROM      AM-PAC PT "6 Clicks" Mobility   Outcome Measure  Help needed turning from your back to your side while in a flat bed without using bedrails?: Total Help needed moving from  lying on your back to sitting on the side of a flat bed without using bedrails?: Total Help needed moving to and from a bed to a chair (including a wheelchair)?: Total Help needed standing up from a chair using your arms (e.g., wheelchair or bedside chair)?: Total Help needed to walk in hospital room?: Total Help needed climbing 3-5 steps with a railing? : Total 6 Click Score: 6    End of Session Equipment Utilized During Treatment: Oxygen Activity Tolerance: Patient tolerated treatment well Patient left: with call bell/phone within reach;in chair Nurse Communication: Mobility status PT Visit Diagnosis: Muscle weakness (generalized) (M62.81);Difficulty in walking, not elsewhere classified (R26.2)     Time: 8841-6606 PT Time Calculation (min) (ACUTE ONLY): 31 min  Charges:  $Therapeutic Exercise: 8-22 mins                    Olga Coaster PT, DPT 12:52 PM,10/19/20

## 2020-10-19 NOTE — Progress Notes (Signed)
Occupational Therapy Treatment Patient Details Name: Christian [name] MRN: [id number] DOB: 1975/11/24 Today's Date: 10/19/2020    History of present illness Pt is a 45 y/o M who presented to ED on 10/09/20 with c/c of bilateral lower abdominal pain & c/o emesis twice that day after eating. Pt found to have abdominal wall purulent cellulitis. Pt admitted with clinical sepsis & AKI. PMH: morbid obesity, HTN. Pt with increased SOB starting 8/18 and CXR ordered with concern for free air. CT showed pneumoperitoneum suspicious for bowel perforation. Pt now s/p diagnostic laparoscopy converted to exploratory laparotomy, primary repair of cecal perforation and t/f to ICU on 8/19.   OT comments  Christian [name] was seen for OT treatment on this date. Upon arrival to room pt reclined in bed agreeable to session, denies pain at bed level. MAX A doff B prevlon boots at bed level. MAX A  x2 sup>sit - pt requires MAX A quickly decreasing to TOTAL A for EOB sitting, unable to progress to seated ADLs 2/2 poor tolerance and requiring BUE support. Pt instructed in ECS including PLB seated EOB. Returned to bed with +3 assist for safety and left with PT in room. Pt making good progress toward goals. Pt continues to benefit from skilled OT services to maximize return to PLOF and minimize risk of future falls, injury, caregiver burden, and readmission. Will continue to follow POC. Discharge recommendation remains appropriate.    Follow Up Recommendations  SNF;Supervision/Assistance - 24 hour    Equipment Recommendations  Other (comment) (defer to next venue of care)    Recommendations for Other Services      Precautions / Restrictions Precautions Precautions: Fall Restrictions Weight Bearing Restrictions: No       Mobility Bed Mobility Overal bed mobility: Needs Assistance Bed Mobility: Rolling;Supine to Sit;Sit to Supine Rolling: Mod assist   Supine to sit: Max assist;+2 for physical assistance Sit to supine:  Max assist;+2 for physical assistance (sit>supine +3 assist for safety and sizewise bed difficulties)        Transfers                 General transfer comment: deferred, unsafe/unable    Balance Overall balance assessment: Needs assistance Sitting-balance support: Bilateral upper extremity supported Sitting balance-[name]: Zero Sitting balance - Comments: maxAx2-3 to transition to EOB. able to reach and grab bed rails, but due to abdominal pain, needed almost complete assist after 10-15 seconds. Postural control: Posterior lean                                 ADL either performed or assessed with clinical judgement   ADL Overall ADL's : Needs assistance/impaired                                       General ADL Comments: MAX A doff B prevlon boots at bed level. MAX A quickly decreasing to TOTAL A for EOB sitting, unable to progress to seated ADLs 2/2 poor tolerance and requiring BUE support      Cognition Arousal/Alertness: Awake/alert Behavior During Therapy: WFL for tasks assessed/performed Overall Cognitive Status: Within Functional Limits for tasks assessed  Exercises Exercises: Other exercises Other Exercises Other Exercises: Pt educated re: d/c recs, falls prevention, ECS Other Exercises: LBD, rolling, sup<>sit, sitting balance/tolerance    Pertinent Vitals/ Pain       Pain Assessment: No/denies pain         Frequency  Min 2X/week        Progress Toward Goals  OT Goals(current goals can now be found in the care plan section)  Progress towards OT goals: Progressing toward goals  Acute Rehab OT Goals Patient Stated Goal: none stated OT Goal Formulation: With patient Time For Goal Achievement: 10/31/20 Potential to Achieve Goals: Good ADL Goals Pt Will Perform Upper Body Dressing: with supervision;sitting Pt Will Perform Lower Body Dressing: with mod  assist;sit to/from stand;with adaptive equipment Pt Will Transfer to Toilet: with mod assist;with +2 assist;squat pivot transfer;bedside commode Pt/caregiver will Perform Home Exercise Program: Both right and left upper extremity;With theraband;With written HEP provided;Independently;Increased strength  Plan Discharge plan remains appropriate;Frequency remains appropriate    Co-evaluation    PT/OT/SLP Co-Evaluation/Treatment: Yes Reason for Co-Treatment: Complexity of the patient's impairments (multi-system involvement);For patient/therapist safety;To address functional/ADL transfers PT goals addressed during session: Mobility/safety with mobility;Strengthening/ROM OT goals addressed during session: ADL's and self-care;Strengthening/ROM      AM-PAC OT "6 Clicks" Daily Activity     Outcome Measure   Help from another person eating meals?: None Help from another person taking care of personal grooming?: None Help from another person toileting, which includes using toliet, bedpan, or urinal?: A Lot Help from another person bathing (including washing, rinsing, drying)?: A Lot Help from another person to put on and taking off regular upper body clothing?: A Lot Help from another person to put on and taking off regular lower body clothing?: A Lot 6 Click Score: 16    End of Session Equipment Utilized During Treatment: Oxygen  OT Visit Diagnosis: Unsteadiness on feet (R26.81);Muscle weakness (generalized) (M62.81)   Activity Tolerance Patient tolerated treatment well   Patient Left in bed;with call bell/phone within reach;Other (comment) (PT in room)   Nurse Communication Mobility status        Time: 7543-6067 OT Time Calculation (min): 16 min  Charges: OT General Charges $OT Visit: 1 Visit OT Treatments $Self Care/Home Management : 8-22 mins  Kathie Dike, M.S. OTR/L  10/19/20, 2:31 PM  ascom [phone]

## 2020-10-19 NOTE — Progress Notes (Signed)
Patient ID: Christian [name], male   DOB: 1975/05/04, 45 y.o.   MRN: [id number] [facility] PROGRESS NOTE  Christian [name] QAS:[id number] DOB: 1975-07-23 DOA: 10/09/2020 PCP: Pcp, No  HPI/Subjective: Patient feels okay.  Still with some shortness of breath and some cough.  Some abdominal pain but less than the other day.  Still very weak.  Initially admitted with sepsis with abdominal wall cellulitis and then developed cecal perforation during the hospital course requiring operation.  Objective: Vitals:   10/19/20 0700 10/19/20 0800  BP: 137/73 123/70  Pulse: 81 86  Resp: 17 (!) 23  Temp:  99 F (37.2 C)  SpO2: 96% 92%    Intake/Output Summary (Last 24 hours) at 10/19/2020 1401 Last data filed at 10/19/2020 1319 Gross per 24 hour  Intake 131.02 ml  Output 2330 ml  Net -2198.98 ml   Filed Weights   10/09/20 0807 10/15/20 1600  Weight: (!) 197.3 kg (!) 201.6 kg    ROS: Review of Systems  Respiratory:  Positive for cough and shortness of breath.   Cardiovascular:  Negative for chest pain.  Gastrointestinal:  Positive for abdominal pain.  Exam: Physical Exam HENT:     Head: Normocephalic.     Mouth/Throat:     Pharynx: No oropharyngeal exudate.  Eyes:     General: Lids are normal.     Conjunctiva/sclera: Conjunctivae normal.  Cardiovascular:     Rate and Rhythm: Normal rate and regular rhythm.     Heart sounds: Normal heart sounds, S1 normal and S2 normal.  Pulmonary:     Breath sounds: Examination of the right-lower field reveals decreased breath sounds and rhonchi. Examination of the left-lower field reveals decreased breath sounds and rhonchi. Decreased breath sounds and rhonchi present. No wheezing or rales.  Abdominal:     Palpations: Abdomen is soft.     Tenderness: There is no abdominal tenderness.  Musculoskeletal:     Right lower leg: Swelling present.     Left lower leg: Swelling present.  Skin:    General: Skin is warm.     Comments: Bilateral  buttock wounds.  Neurological:     Mental Status: He is alert.     Comments: Answers questions appropriately      Scheduled Meds:  Chlorhexidine Gluconate Cloth  6 each Topical Daily   collagenase   Topical Daily   heparin injection (subcutaneous)  7,500 Units Subcutaneous Q8H   ipratropium-albuterol  3 mL Nebulization TID   potassium chloride  20 mEq Oral BID   Continuous Infusions:  piperacillin-tazobactam (ZOSYN)  IV 3.375 g (10/19/20 1319)    Assessment/Plan:  Acute on chronic hypoxic hypercarbic respiratory failure.  Continue BiPAP at night.  Taper down his high flow nasal cannula down to 7 L.  Continue incentive spirometry and working with physical therapy.  We will start low-dose Lasix. Cecal perforation postoperative day 4.  NG tube was in this morning but clamped.  General surgery started clear liquid diet.  Complete Zosyn today then discontinue. Hypotension resolved. Severe sepsis, present on admission with acute kidney injury and abdominal wall cellulitis and bilateral lower extremity buttock wounds and cellulitis.  Initially on Keflex and Diflucan.  Switch to Zosyn with cecal perforation.  Will finish antibiotics today.  See full description below for lower extremity wounds. Morbid obesity with sleep apnea Acute kidney injury.  Creatinine peaked at 1.43 and improved to 0.89 today. Weakness.  Physical therapy recommending rehab but without insurance this will be difficult.  Pressure  Injury 10/09/20 Buttocks Left Unstageable - Full thickness tissue loss in which the base of the injury is covered by slough (yellow, tan, gray, green or brown) and/or eschar (tan, brown or black) in the wound bed. (Active)  10/09/20   Location: Buttocks  Location Orientation: Left  Staging: Unstageable - Full thickness tissue loss in which the base of the injury is covered by slough (yellow, tan, gray, green or brown) and/or eschar (tan, brown or black) in the wound bed.  Wound Description  (Comments):   Present on Admission: Yes     Pressure Injury 10/09/20 Buttocks Right Unstageable - Full thickness tissue loss in which the base of the injury is covered by slough (yellow, tan, gray, green or brown) and/or eschar (tan, brown or black) in the wound bed. (Active)  10/09/20   Location: Buttocks  Location Orientation: Right  Staging: Unstageable - Full thickness tissue loss in which the base of the injury is covered by slough (yellow, tan, gray, green or brown) and/or eschar (tan, brown or black) in the wound bed.  Wound Description (Comments):   Present on Admission: Yes     Pressure Injury 10/09/20 Heel Left Stage 2 -  Partial thickness loss of dermis presenting as a shallow open injury with a red, pink wound bed without slough. (Active)  10/09/20   Location: Heel  Location Orientation: Left  Staging: Stage 2 -  Partial thickness loss of dermis presenting as a shallow open injury with a red, pink wound bed without slough.  Wound Description (Comments):   Present on Admission: Yes     Pressure Injury 10/09/20 Heel Right Stage 2 -  Partial thickness loss of dermis presenting as a shallow open injury with a red, pink wound bed without slough. (Active)  10/09/20   Location: Heel  Location Orientation: Right  Staging: Stage 2 -  Partial thickness loss of dermis presenting as a shallow open injury with a red, pink wound bed without slough.  Wound Description (Comments):   Present on Admission: Yes     Pressure Injury Foot Right Stage 2 -  Partial thickness loss of dermis presenting as a shallow open injury with a red, pink wound bed without slough. (Active)     Location: Foot  Location Orientation: Right  Staging: Stage 2 -  Partial thickness loss of dermis presenting as a shallow open injury with a red, pink wound bed without slough.  Wound Description (Comments):   Present on Admission: Yes       Code Status:     Code Status Orders  (From admission, onward)            Start     Ordered   10/09/20 1235  Full code  Continuous        10/09/20 1236           Code Status History     This patient has a current code status but no historical code status.      Disposition Plan: Status is: Inpatient  Dispo: The patient is from: Homeless              Anticipated d/c is to: To be determined              Patient currently still on high flow nasal cannula and not ready for any disposition at this point.   Difficult to place patient.  Yes.  Consultants: General surgery  Procedures: Cecal perforation repair  Antibiotics: Zosyn will complete today and be  discontinued  Time spent: 27 minutes   Christian [name] [employer]

## 2020-10-20 LAB — BASIC METABOLIC PANEL
Anion gap: 10 (ref 5–15)
BUN: 15 mg/dL (ref 6–20)
CO2: 32 mmol/L (ref 22–32)
Calcium: 8.4 mg/dL — ABNORMAL LOW (ref 8.9–10.3)
Chloride: 98 mmol/L (ref 98–111)
Creatinine, Ser: 0.94 mg/dL (ref 0.61–1.24)
GFR, Estimated: >60 mL/min (ref >60)
Glucose, Bld: 99 mg/dL (ref 70–99)
Potassium: 4.3 mmol/L (ref 3.5–5.1)
Sodium: 140 mmol/L (ref 135–145)

## 2020-10-20 LAB — MAGNESIUM: Magnesium: 2.4 mg/dL (ref 1.7–2.4)

## 2020-10-20 NOTE — Progress Notes (Signed)
Subjective:  CC: Christian [name] is a 45 y.o. male  Hospital stay day 10, 5 Days Post-Op ex-lap cecal perforation repair  HPI: No acute issues overnight. Still reports passing more flatus. Unsure if he has had a BM  ROS:  General: Denies weight loss, weight gain, fatigue, fevers, chills, and night sweats. Heart: Denies chest pain, palpitations, racing heart, irregular heartbeat, leg pain or swelling, and decreased activity tolerance. Respiratory: Denies breathing difficulty, shortness of breath, wheezing, cough, and sputum. GI: Denies change in appetite, heartburn, nausea, vomiting, constipation, diarrhea, and blood in stool. GU: Denies difficulty urinating, pain with urinating, urgency, frequency, blood in urine.   Objective:   Temp:  [98.9 F (37.2 C)-99.1 F (37.3 C)] 99.1 F (37.3 C) (08/23 0400) Pulse Rate:  [77-88] 82 (08/23 0400) Resp:  [16-30] 19 (08/23 0400) BP: (109-132)/(66-80) 109/66 (08/23 0400) SpO2:  [92 %-100 %] 98 % (08/23 0400)     Height: 6\' 3"  (190.5 cm) Weight: (!) 201.6 kg BMI (Calculated): 55.55   Intake/Output this shift:   Intake/Output Summary (Last 24 hours) at 10/20/2020 0720 Last data filed at 10/20/2020 0600 Gross per 24 hour  Intake 73.33 ml  Output 2195 ml  Net -2121.67 ml    Constitutional :  alert, cooperative, appears stated age, and no distress  Respiratory:  clear to auscultation bilaterally  Cardiovascular:  regular rate and rhythm  Gastrointestinal: Soft, no guarding, some TTP at incision, dressing c/d/i  JP remains serosanguinous discharge,   Skin: Cool and moist.   Psychiatric: Normal affect, non-agitated, not confused       LABS:  CMP Latest Ref Rng & Units 10/20/2020 10/19/2020 10/18/2020  Glucose 70 - 99 mg/dL 99 10/20/2020) 833(A)  BUN 6 - 20 mg/dL 15 15 16   Creatinine 0.61 - 1.24 mg/dL 250(N 3.97  Sodium 135 - 145 mmol/L 140 140 141  Potassium 3.5 - 5.1 mmol/L 4.3 4.4 4.0  Chloride 98 - 111 mmol/L 98 102 99  CO2 22 - 32  mmol/L 32 32 33(H)  Calcium 8.9 - 10.3 mg/dL 6.73) 4.19) 3.7(T)  Total Protein 6.5 - 8.1 g/dL - - -  Total Bilirubin 0.3 - 1.2 mg/dL - - -  Alkaline Phos 38 - 126 U/L - - -  AST 15 - 41 U/L - - -  ALT 0 - 44 U/L - - -   CBC Latest Ref Rng & Units 10/19/2020 10/17/2020 10/16/2020  WBC 4.0 - 10.5 K/uL 9.1 14.5(H) 17.3(H)  Hemoglobin 13.0 - 17.0 g/dL 12.9(L) 11.3(L) 14.3  Hematocrit 39.0 - 52.0 % 42.3 36.7(L) 47.4  Platelets 150 - 400 K/uL 440(H) 393 425(H)    RADS: N/a Assessment:   S/p cecal perforation repair. Tolerating clears.  Can advance diet as tolerated once he had documented BM.

## 2020-10-20 NOTE — Progress Notes (Signed)
Patient ID: Christian [name], male   DOB: Nov 25, 1975, 45 y.o.   MRN: [id number] [facility] PROGRESS NOTE  Gurtej Noyola ZDG:[id number] DOB: 23-Jul-1975 DOA: 10/09/2020 PCP: Pcp, No  HPI/Subjective: Patient feeling a little bit better today.  Had bowel movement.  Little less abdominal pain.  Not having much shortness of breath.  Initially admitted with sepsis and abdominal wall cellulitis then developed cecal perforation during the hospital course requiring operative repair.  Objective: Vitals:   10/20/20 1000 10/20/20 1100  BP: 122/79 127/83  Pulse: 90 89  Resp: (!) 23 (!) 25  Temp:  98 F (36.7 C)  SpO2: 94% 94%    Intake/Output Summary (Last 24 hours) at 10/20/2020 1253 Last data filed at 10/20/2020 0900 Gross per 24 hour  Intake 300.79 ml  Output 2360 ml  Net -2059.21 ml   Filed Weights   10/09/20 0807 10/15/20 1600  Weight: (!) 197.3 kg (!) 201.6 kg    ROS: Review of Systems  Respiratory:  Positive for shortness of breath.   Cardiovascular:  Negative for chest pain.  Gastrointestinal:  Positive for abdominal pain. Negative for nausea and vomiting.  Exam: Physical Exam HENT:     Head: Normocephalic.     Mouth/Throat:     Pharynx: No oropharyngeal exudate.  Eyes:     General: Lids are normal.     Conjunctiva/sclera: Conjunctivae normal.  Cardiovascular:     Rate and Rhythm: Normal rate and regular rhythm.     Heart sounds: Normal heart sounds, S1 normal and S2 normal.  Pulmonary:     Breath sounds: Examination of the right-lower field reveals decreased breath sounds. Examination of the left-lower field reveals decreased breath sounds. Decreased breath sounds present. No wheezing, rhonchi or rales.  Abdominal:     Palpations: Abdomen is soft.     Tenderness: There is abdominal tenderness.  Musculoskeletal:     Right lower leg: Swelling present.     Left lower leg: Swelling present.  Skin:    General: Skin is warm.     Comments: Bilateral buttock and  lower leg ulcerations.  Neurological:     Mental Status: He is alert and oriented to person, place, and time.      Scheduled Meds:  Chlorhexidine Gluconate Cloth  6 each Topical Daily   collagenase   Topical Daily   furosemide  20 mg Oral Daily   heparin injection (subcutaneous)  7,500 Units Subcutaneous Q8H   ipratropium-albuterol  3 mL Nebulization TID   potassium chloride  20 mEq Oral BID   Brief history.  45 year old morbidly obese homeless male living in his car came to the hospital with lower abdominal cellulitis and buttock and back and lower leg wounds.  Patient was started on antibiotics.  On 818 had increased blood pressure and tachycardic, I obtained a chest x-ray which showed free air under the diaphragm.  Dr. Tonna Boehringer was contacted and performed surgery on a cecal perforation.  Now passing gas and having bowel movements and diet should be advanced this evening.  Patient also had acute hypoxic hypercarbic respiratory failure requiring BiPAP at night and high flow nasal cannula during the day.  Was tapered over to regular nasal cannula on 10/20/2020.  Assessment/Plan:  Acute on chronic hypoxic hypercarbic respiratory failure.  Continue BiPAP at night.  Switch from high flow nasal cannula over to regular nasal cannula 4 L.  Continue incentive spirometer.  Continue low-dose Lasix. Cecal perforation status post operative repair by Dr. Tonna Boehringer on 10/15/2020.  Full liquid diet started today hopefully solid food can be started this evening.  Completed Zosyn. Hypotension postoperatively.  This has resolved Severe sepsis, present on admission with acute kidney injury and abdominal wall cellulitis and bilateral lower extremity buttock wounds and cellulitis.  Initially was on Ancef and then switched over to Keflex but when the cecal perforation happened switched over to Zosyn.  Completed antibiotics.  See descriptions of the wounds below. Morbid obesity with sleep apnea.  On BiPAP at night here in  the hospital Acute kidney injury.  Creatinine peaked at 1.43 and improved to 0.81 at its lowest and currently 0.94 today. Weakness.  Patient able to straight leg raise today in bed.  Physical therapy recommending rehab but without insurance this likely will be difficult.  Pressure Injury 10/09/20 Buttocks Left Unstageable - Full thickness tissue loss in which the base of the injury is covered by slough (yellow, tan, gray, green or brown) and/or eschar (tan, brown or black) in the wound bed. (Active)  10/09/20   Location: Buttocks  Location Orientation: Left  Staging: Unstageable - Full thickness tissue loss in which the base of the injury is covered by slough (yellow, tan, gray, green or brown) and/or eschar (tan, brown or black) in the wound bed.  Wound Description (Comments):   Present on Admission: Yes     Pressure Injury 10/09/20 Buttocks Right Unstageable - Full thickness tissue loss in which the base of the injury is covered by slough (yellow, tan, gray, green or brown) and/or eschar (tan, brown or black) in the wound bed. (Active)  10/09/20   Location: Buttocks  Location Orientation: Right  Staging: Unstageable - Full thickness tissue loss in which the base of the injury is covered by slough (yellow, tan, gray, green or brown) and/or eschar (tan, brown or black) in the wound bed.  Wound Description (Comments):   Present on Admission: Yes     Pressure Injury 10/09/20 Heel Left Stage 2 -  Partial thickness loss of dermis presenting as a shallow open injury with a red, pink wound bed without slough. (Active)  10/09/20   Location: Heel  Location Orientation: Left  Staging: Stage 2 -  Partial thickness loss of dermis presenting as a shallow open injury with a red, pink wound bed without slough.  Wound Description (Comments):   Present on Admission: Yes     Pressure Injury 10/09/20 Heel Right Stage 2 -  Partial thickness loss of dermis presenting as a shallow open injury with a red, pink  wound bed without slough. (Active)  10/09/20   Location: Heel  Location Orientation: Right  Staging: Stage 2 -  Partial thickness loss of dermis presenting as a shallow open injury with a red, pink wound bed without slough.  Wound Description (Comments):   Present on Admission: Yes     Pressure Injury Foot Right Stage 2 -  Partial thickness loss of dermis presenting as a shallow open injury with a red, pink wound bed without slough. (Active)     Location: Foot  Location Orientation: Right  Staging: Stage 2 -  Partial thickness loss of dermis presenting as a shallow open injury with a red, pink wound bed without slough.  Wound Description (Comments):   Present on Admission: Yes       Code Status:     Code Status Orders  (From admission, onward)           Start     Ordered   10/09/20 1235  Full  code  Continuous        10/09/20 1236           Code Status History     This patient has a current code status but no historical code status.       Disposition Plan: Status is: Inpatient  Dispo: The patient is from: His car              Anticipated d/c is to: To be determined based on clinical course              Patient currently will be able to transfer out of the stepdown unit today.  Continue mobilization with physical therapy   Difficult to place patient.  Yes.  Consultants: General surgery  Procedures: Perforated cecum status postrepair  Antibiotics: -Completed antibiotics during hospital course  Time spent: 28 minutes  Emony Dormer [employer]

## 2020-10-20 NOTE — Progress Notes (Addendum)
Physical Therapy Treatment Patient Details Name: Christian [name] MRN: [id number] DOB: 1975/05/15 Today's Date: 10/20/2020    History of Present Illness Pt is a 45 y/o M who presented to ED on 10/09/20 with c/c of bilateral lower abdominal pain & c/o emesis twice that day after eating. Pt found to have abdominal wall purulent cellulitis. Pt admitted with clinical sepsis & AKI. PMH: morbid obesity, HTN. Pt with increased SOB starting 8/18 and CXR ordered with concern for free air. CT showed pneumoperitoneum suspicious for bowel perforation. Pt now s/p diagnostic laparoscopy converted to exploratory laparotomy, primary repair of cecal perforation and t/f to ICU on 8/19.    PT Comments    Patient was alert, agreeable to PT. Denied pain at rest, but with mobility mentioned abdominal pain as well as calf pain (resolved with repositioning/rest). The patient was able to perform rolling with supervision to allow placement of hoyer lift sling. Pt hoyered to recliner in room with 2+ for safety. Once up to chair, sit <> stand with RW and minAx2 for safety, assist only really needed for some steadying and RW management, initial L knee blocking. Pt needed step by step instruction for weight shift in recliner, including anteriorly prior to transfer. Pt up in chair with all needs in reach at end of session.       Follow Up Recommendations  SNF;Supervision/Assistance - 24 hour     Equipment Recommendations  Other (comment) (TBD at next venue)    Recommendations for Other Services       Precautions / Restrictions Precautions Precautions: Fall Restrictions Weight Bearing Restrictions: No    Mobility  Bed Mobility   Bed Mobility: Rolling Rolling: Supervision;Min guard             Transfers  Sit <> stand: minAx2 with RW, steadying assist and initial LLE blocking. Able to maintain standing ~8minutes.                   Ambulation/Gait                 Stairs              Wheelchair Mobility    Modified Rankin (Stroke Patients Only)       Balance Overall balance assessment: Needs assistance   Sitting balance-[name]: Poor Sitting balance - Comments: poor abdominal strength noted, due to pain. reliant on UE support for anterior weight shift     Standing balance-[name]: Fair Standing balance comment: RW provided in standing, pt safer/more comfortable with UE support                            Cognition Arousal/Alertness: Awake/alert Behavior During Therapy: WFL for tasks assessed/performed Overall Cognitive Status: Within Functional Limits for tasks assessed                                        Exercises      General Comments        Pertinent Vitals/Pain Pain Assessment: Faces Faces Pain Scale: Hurts a little bit Pain Location: abdomen Pain Descriptors / Indicators: Discomfort;Dull Pain Intervention(s): Limited activity within patient's tolerance;Repositioned;Monitored during session    Home Living                      Prior Function  PT Goals (current goals can now be found in the care plan section) Progress towards PT goals: Progressing toward goals    Frequency    Min 2X/week      PT Plan Current plan remains appropriate    Co-evaluation              AM-PAC PT "6 Clicks" Mobility   Outcome Measure  Help needed turning from your back to your side while in a flat bed without using bedrails?: Total Help needed moving from lying on your back to sitting on the side of a flat bed without using bedrails?: Total Help needed moving to and from a bed to a chair (including a wheelchair)?: Total Help needed standing up from a chair using your arms (e.g., wheelchair or bedside chair)?: Total Help needed to walk in hospital room?: Total Help needed climbing 3-5 steps with a railing? : Total 6 Click Score: 6    End of Session Equipment Utilized During  Treatment: Oxygen Activity Tolerance: Patient tolerated treatment well Patient left: with call bell/phone within reach;in chair Nurse Communication: Mobility status PT Visit Diagnosis: Muscle weakness (generalized) (M62.81);Difficulty in walking, not elsewhere classified (R26.2)     Time: 5621-3086 PT Time Calculation (min) (ACUTE ONLY): 33 min  Charges:  $Therapeutic Exercise: 8-22 mins $Therapeutic Activity: 8-22 mins                     Olga Coaster PT, DPT 2:04 PM,10/20/20

## 2020-10-20 NOTE — TOC Progression Note (Addendum)
Transition of Care [facility]) - Progression Note    Patient Details  Name: Christian [name] MRN: [id number] Date of Birth: October 12, 1975  Transition [facility]) CM/SW Contact  Marina Goodell Phone Number: [phone] 10/20/2020, 11:15 AM  Clinical Narrative:     Patient improved, will transfer to [facility], 4L nasal cannula. CSW discussed discharge planning with patient.  Patient stated he did not have a set discharge plan and he wanted to speak with his friends about it.  CSW stated PT/OT recommended SNF and TOC would assist in trying to place him w/ LOG placement, but explained his lack of insurance, and not qualifying for Medicaid will be barriers to placement.  CSW explained there is a possibility of LOG placement but that is only available for 30 days.  CSW discussed different discharge plan including [facility] which is a long term homeless shelter.  CSW encouraged patient to contact his friends and discuss the different options available.  Patient verbalized understanding.     Barriers to Discharge: Continued Medical Work up  Expected Discharge Plan and Services         Living arrangements for the past 2 months: Homeless                                       Social Determinants of Health (SDOH) Interventions    Readmission Risk Interventions Readmission Risk Prevention Plan 10/11/2020  Post Dischage Appt Complete  Medication Screening Complete  Transportation Screening Complete  Some recent data might be hidden

## 2020-10-20 NOTE — Progress Notes (Signed)
Physical Therapy Treatment Patient Details Name: Christian [name] MRN: [id number] DOB: [birth year] Today's Date: 10/20/2020    History of Present Illness Pt is a 45 y/o M who presented to ED on 10/09/20 with c/c of bilateral lower abdominal pain & c/o emesis twice that day after eating. Pt found to have abdominal wall purulent cellulitis. Pt admitted with clinical sepsis & AKI. PMH: morbid obesity, HTN. Pt with increased SOB starting 8/18 and CXR ordered with concern for free air. CT showed pneumoperitoneum suspicious for bowel perforation. Pt now s/p diagnostic laparoscopy converted to exploratory laparotomy, primary repair of cecal perforation and t/f to ICU on 8/19.    PT Comments    Patient seen in PM to assist with returning to bed. The patient was able to perform sit <> Stand from recliner with UE support, RW stabilization and initial L knee block, minAx2 for safety. He was able to stand and take a few steps to transfer to EOB, and returned to sitting safely. minA for sit  > supine for LE assist. Repositioned with all needs in reach. The patient would benefit from further skilled PT intervention to continue to progress towards goals. Recommendation remains appropriate.     Follow Up Recommendations  SNF;Supervision/Assistance - 24 hour     Equipment Recommendations  Other (comment) (TBD)    Recommendations for Other Services       Precautions / Restrictions Precautions Precautions: Fall Restrictions Weight Bearing Restrictions: No    Mobility  Bed Mobility Overal bed mobility: Needs Assistance Bed Mobility: Sit to Supine Rolling: Supervision;Min guard     Sit to supine: Min assist   General bed mobility comments: cues for hand placement, minA for LE assist into bed    Transfers Overall transfer level: Needs assistance Equipment used: Rolling walker (2 wheeled) Transfers: Sit to/from [employer] Sit to Stand: Min assist Stand pivot transfers: Min  assist       General transfer comment: from recliner, minA for RW steadying cues for initiation, trunk lean. pt able to stand pivot to recliner with minA for RW guidance, initial knee block on LLE.  Ambulation/Gait                 Stairs             Wheelchair Mobility    Modified Rankin (Stroke Patients Only)       Balance Overall balance assessment: Needs assistance   Sitting balance-[name]: Poor Sitting balance - Comments: poor abdominal strength noted, due to pain. reliant on UE support for anterior weight shift     Standing balance-[name]: Fair Standing balance comment: RW provided in standing, pt safer/more comfortable with UE support                            Cognition Arousal/Alertness: Awake/alert Behavior During Therapy: WFL for tasks assessed/performed Overall Cognitive Status: Within Functional Limits for tasks assessed                                        Exercises      General Comments        Pertinent Vitals/Pain Pain Assessment: Faces Faces Pain Scale: Hurts a little bit Pain Location: abdomen Pain Descriptors / Indicators: Discomfort;Dull Pain Intervention(s): Limited activity within patient's tolerance;Monitored during session;Repositioned    Home Living  Prior Function            PT Goals (current goals can now be found in the care plan section) Progress towards PT goals: Progressing toward goals    Frequency    Min 2X/week      PT Plan Current plan remains appropriate    Co-evaluation              AM-PAC PT "6 Clicks" Mobility   Outcome Measure  Help needed turning from your back to your side while in a flat bed without using bedrails?: Total Help needed moving from lying on your back to sitting on the side of a flat bed without using bedrails?: Total Help needed moving to and from a bed to a chair (including a wheelchair)?:  Total Help needed standing up from a chair using your arms (e.g., wheelchair or bedside chair)?: Total Help needed to walk in hospital room?: Total Help needed climbing 3-5 steps with a railing? : Total 6 Click Score: 6    End of Session Equipment Utilized During Treatment: Oxygen Activity Tolerance: Patient tolerated treatment well Patient left: with call bell/phone within reach;in bed Nurse Communication: Mobility status PT Visit Diagnosis: Muscle weakness (generalized) (M62.81);Difficulty in walking, not elsewhere classified (R26.2)     Time: 1329-1340 PT Time Calculation (min) (ACUTE ONLY): 11 min  Charges:  $Therapeutic Exercise: 8-22 mins $Therapeutic Activity: 8-22 mins                     Olga Coaster PT, DPT 2:11 PM,10/20/20

## 2020-10-20 NOTE — Progress Notes (Addendum)
Occupational Therapy Treatment Patient Details Name: Christian [name] MRN: [id number] DOB: 05-23-1975 Today's Date: 10/20/2020    History of present illness Pt is a 45 y/o M who presented to ED on 10/09/20 with c/c of bilateral lower abdominal pain & c/o emesis twice that day after eating. Pt found to have abdominal wall purulent cellulitis. Pt admitted with clinical sepsis & AKI. PMH: morbid obesity, HTN. Pt with increased SOB starting 8/18 and CXR ordered with concern for free air. CT showed pneumoperitoneum suspicious for bowel perforation. Pt now s/p diagnostic laparoscopy converted to exploratory laparotomy, primary repair of cecal perforation and t/f to ICU on 8/19.   OT comments  Christian [name] was seen for OT treatment on this date. Upon arrival to room pt reclined in bed, agreeable to session. Pt requires SBA for rolling L+R at bed level for periaccess, MAX A washing back. Pt demos necessary strength/ROM to reach chest/arms for UB washing at bed level c MIN A however unable to complete 2/2 incision site/lines. Pt instructed on bed level exercises and importance of repositioning for skin integrity and functional strengthening. Pt tolerated L+R rolling x5 each side and pulling self higher in bed using rails c SBA - pt requires cues for awareness of lines/leads and reports 6/10 fatigue. Pt agreeable to EOB trial however breakfast noted to be untouched in room and pt reports eager to eat as he requires a BM before advancing diet from clear liquids. Pt setup for breakfast c HOB elevated.  Pt instructed on bed level HEP including bridges with good return demonstration - tolerates x5 reps prior to fatigue. Pt making good progress toward goals. Pt continues to benefit from skilled OT services to maximize return to PLOF and minimize risk of future falls, injury, caregiver burden, and readmission. Will continue to follow POC. Discharge recommendation remains appropriate.    Follow Up Recommendations   SNF;Supervision/Assistance - 24 hour    Equipment Recommendations  Other (comment) (defer to next venue of care)    Recommendations for Other Services      Precautions / Restrictions Precautions Precautions: Fall Restrictions Weight Bearing Restrictions: No       Mobility Bed Mobility Overal bed mobility: Needs Assistance Bed Mobility: Rolling Rolling: Min guard         General bed mobility comments: SBA L+R rolling x5 each side and pulling self higher in bed using rails - pt requires cues for awareness of lines/leads.                        ADL either performed or assessed with clinical judgement   ADL Overall ADL's : Needs assistance/impaired                                       General ADL Comments: SBA for rolling L+R at bed level for periaccess, MAX A washing back. Pt demos necessary strength/ROM to reach chest/arms for UB washing at bed level c MIN A however unable to compelete 2/2 incision site/lines.      Cognition Arousal/Alertness: Awake/alert Behavior During Therapy: WFL for tasks assessed/performed Overall Cognitive Status: Within Functional Limits for tasks assessed                                          Exercises  Exercises: Other exercises Other Exercises Other Exercises: Pt educated re: d/c recs, falls prevention, ECS, HEP Other Exercises: UB bathing, rolling x5, bridges at bed level, bed mobility   Pertinent Vitals/ Pain       Pain Assessment: 0-10 Pain Score: 4  Pain Location: abdomen Pain Descriptors / Indicators: Discomfort;Dull Pain Intervention(s): Limited activity within patient's tolerance;Repositioned         Frequency  Min 2X/week        Progress Toward Goals  OT Goals(current goals can now be found in the care plan section)  Progress towards OT goals: Progressing toward goals  Acute Rehab OT Goals Patient Stated Goal: to get stronger OT Goal Formulation: With patient Time  For Goal Achievement: 10/31/20 Potential to Achieve Goals: Good ADL Goals Pt Will Perform Upper Body Dressing: with supervision;sitting Pt Will Perform Lower Body Dressing: with mod assist;sit to/from stand;with adaptive equipment Pt Will Transfer to Toilet: with mod assist;with +2 assist;squat pivot transfer;bedside commode Pt/caregiver will Perform Home Exercise Program: Both right and left upper extremity;With theraband;With written HEP provided;Independently;Increased strength  Plan Discharge plan remains appropriate;Frequency remains appropriate       AM-PAC OT "6 Clicks" Daily Activity     Outcome Measure   Help from another person eating meals?: None Help from another person taking care of personal grooming?: None Help from another person toileting, which includes using toliet, bedpan, or urinal?: A Lot Help from another person bathing (including washing, rinsing, drying)?: A Lot Help from another person to put on and taking off regular upper body clothing?: A Lot Help from another person to put on and taking off regular lower body clothing?: A Lot 6 Click Score: 16    End of Session Equipment Utilized During Treatment: Oxygen  OT Visit Diagnosis: Unsteadiness on feet (R26.81);Muscle weakness (generalized) (M62.81)   Activity Tolerance Patient tolerated treatment well   Patient Left in bed;with call bell/phone within reach   Nurse Communication          Time: 7846-9629 OT Time Calculation (min): 33 min  Charges: OT General Charges $OT Visit: 1 Visit OT Treatments $Self Care/Home Management : 8-22 mins $Therapeutic Exercise: 8-22 mins  Kathie Dike, M.S. OTR/L  10/20/20, 9:43 AM  ascom [phone]

## 2020-10-21 LAB — BASIC METABOLIC PANEL
Anion gap: 11 (ref 5–15)
BUN: 16 mg/dL (ref 6–20)
CO2: 28 mmol/L (ref 22–32)
Calcium: 8.4 mg/dL — ABNORMAL LOW (ref 8.9–10.3)
Chloride: 98 mmol/L (ref 98–111)
Creatinine, Ser: 0.91 mg/dL (ref 0.61–1.24)
GFR, Estimated: >60 mL/min (ref >60)
Glucose, Bld: 109 mg/dL — ABNORMAL HIGH (ref 70–99)
Potassium: 4.2 mmol/L (ref 3.5–5.1)
Sodium: 137 mmol/L (ref 135–145)

## 2020-10-21 LAB — AEROBIC/ANAEROBIC CULTURE W GRAM STAIN (SURGICAL/DEEP WOUND): Culture: NO GROWTH

## 2020-10-21 LAB — MAGNESIUM: Magnesium: 2.3 mg/dL (ref 1.7–2.4)

## 2020-10-21 MED ORDER — ENOXAPARIN SODIUM 100 MG/ML IJ SOSY
100.0000 mg | PREFILLED_SYRINGE | INTRAMUSCULAR | Status: DC
  Administered 2020-10-21 – 2020-11-29 (×40): 100 mg via SUBCUTANEOUS
  Filled 2020-10-21 (×41): qty 1

## 2020-10-21 MED ORDER — ENOXAPARIN SODIUM 40 MG/0.4ML IJ SOSY: 40.0000 mg | PREFILLED_SYRINGE | INTRAMUSCULAR | Status: DC

## 2020-10-21 NOTE — Progress Notes (Signed)
PHARMACIST - PHYSICIAN COMMUNICATION  CONCERNING:  Enoxaparin (Lovenox) for DVT Prophylaxis    RECOMMENDATION: Patient was prescribed enoxaprin 40mg  q24 hours for VTE prophylaxis.   Filed Weights   10/09/20 0807 10/15/20 1600  Weight: (!) 197.3 kg (435 lb) (!) 201.6 kg (444 lb 6.4 oz)    Body mass index is 55.55 kg/m.  Estimated Creatinine Clearance: 188.4 mL/min (by C-G formula based on SCr of 0.91 mg/dL).   Based on [facility] policy patient is candidate for enoxaparin 0.5mg /kg TBW SQ every 24 hours based on BMI being >30.   DESCRIPTION: Pharmacy has adjusted enoxaparin dose per [facility] policy.  Patient is now receiving enoxaparin 100 mg every 24 hours     [facility], PharmD Pharmacy Resident  10/21/2020 3:57 PM

## 2020-10-21 NOTE — Progress Notes (Signed)
PROGRESS NOTE    Christian [name]  KDX:[id number] DOB: 09/11/1975 DOA: 10/09/2020 PCP: Pcp, No   Brief Narrative:  45 y.o. male with medical history significant for morbid obesity, hypertension presents to the emergency department via EMS for chief concerns of bilateral lower abdominal pain.   He reports that the pain is in the bilateral lower abdominal region, described as sharp, 5/10 and is now improved to a 4/10. He vomited twice today after eating. He denies fever, chest pain, dysuria. He endorses feeling winded when getting from the ambulance to the ED bed. He could still breath.  He does endorse diarrhea that is brown liquidy stool.  He soiled himself because he states he was feeling so weak from the abdominal pain, he could not get up out of his car.  Initial diagnosis was abdominal wall purulent cellulitis.  Patient with increased work of breathing noted 8/18.  Chest x-ray with free air under diaphragm.  CT with pneumoperitoneum.  Status post diagnostic laparoscopy and repair of the cecal perforation.  Patient is hemodynamically stable however we are lacking safe discharge plan at this time.   Assessment & Plan:   Principal Problem:   Severe sepsis (HCC) Active Problems:   Morbid obesity (HCC)   Hyperglycemia   AKI (acute kidney injury) (HCC)   Pressure ulcer of back   Pressure ulcer of buttock   Essential hypertension   Cellulitis of abdominal wall   Impaired fasting glucose   Acute respiratory failure with hypoxia (HCC)   Weakness   Acute on chronic respiratory failure with hypoxia and hypercapnia (HCC)   Bowel perforation (HCC)   Hypotension   Obesity hypoventilation syndrome (HCC)  Acute on chronic hypoxic and hypercarbic respiratory failure Likely due to undiagnosed obstructive sleep apnea/obesity hypoventilation Mentating clearly On BiPAP at night, nasal cannula during the day Plan: Continue nasal cannula Wean as tolerated Nightly BiPAP Incentive spirometry  and therapy evaluations, exert and ambulate as tolerated Continue low-dose Lasix  Pneumoperitoneum with cecal perforation Postoperative day #5 Tolerating diet with documented bowel movement Completed course of antibiotics Plan: Diet as tolerated  Severe sepsis secondary to abdominal wall and bilateral lower extremity cellulitis Also presented with AKI, now resolved Completed course of antibiotics Continue with wound care  Morbid obesity With suspected associated sleep apnea This complicates overall care and prognosis  Acute kidney injury Resolved  Weakness Functional decline Therapy recommending rehab but patient is uninsured and we are lacking safe disposition plan.  Patient also lives in his car    DVT prophylaxis: SQ Lovenox Code Status: Full Family Communication: None today Disposition Plan: Status is: Inpatient  Remains inpatient appropriate because:Inpatient level of care appropriate due to severity of illness  Dispo: The patient is from: Home              Anticipated d/c is to:  TBD              Patient currently is medically stable to d/c.   Difficult to place patient Yes  Patient is essentially medically ready for discharge at this time.  We are currently lacking safe disposition plan.  Patient is on NIPPV at night and dependent on 2 to 3 L nasal cannula during the day.  Uninsured, presenting disposition difficulties.     Level of care: Med-Surg  Consultants:  None  Procedures:  Exploratory laparotomy  Antimicrobials:  None   Subjective: Seen and examined.  Resting in bed.  Eating breakfast.  No visible distress.  Objective: Vitals:  10/20/20 2321 10/21/20 0425 10/21/20 0757 10/21/20 0837  BP: 105/73 114/68 133/87   Pulse: 90 88 79   Resp: 20 18 (!) 24   Temp: 98.2 F (36.8 C) 98.8 F (37.1 C) 98.7 F (37.1 C)   TempSrc: Oral Oral Oral   SpO2: 97% 96% 99% 90%  Weight:      Height:        Intake/Output Summary (Last 24 hours) at  10/21/2020 1329 Last data filed at 10/20/2020 2300 Gross per 24 hour  Intake 240 ml  Output 850 ml  Net -610 ml   Filed Weights   10/09/20 0807 10/15/20 1600  Weight: (!) 197.3 kg (!) 201.6 kg    Examination:  General exam: No acute distress.  Appears chronically ill Respiratory system: Lung sounds decreased at bases.  Bibasilar rhonchi.  Normal work of breathing.  2 L Cardiovascular system: Distant heart sounds, S1-S2, regular rate and rhythm, no murmurs, nonpitting peripheral edema bilateral lower extremities Gastrointestinal system: Abdomen is nondistended, soft and nontender. No organomegaly or masses felt. Normal bowel sounds heard. Central nervous system: Alert and oriented. No focal neurological deficits. Extremities: Symmetric 5 x 5 power. Skin: Bilateral buttock wounds Psychiatry: Judgement and insight appear normal. Mood & affect appropriate.     Data Reviewed: I have personally reviewed following labs and imaging studies  CBC: Recent Labs  Lab 10/16/20 0516 10/17/20 0452 10/19/20 0439  WBC 17.3* 14.5* 9.1  NEUTROABS  --   --  6.1  HGB 14.3 11.3* 12.9*  HCT 47.4 36.7* 42.3  MCV 87.0 86.4 85.5  PLT 425* 393 440*   Basic Metabolic Panel: Recent Labs  Lab 10/17/20 0452 10/18/20 0648 10/19/20 0439 10/20/20 0452 10/21/20 0535  NA 143 141 140 140 137  K 4.2 4.0 4.4 4.3 4.2  CL 96* 99 102 98 98  CO2 38* 33* 32 32 28  GLUCOSE 138* 103* 101* 99 109*  BUN 15 16 15 15 16   CREATININE 0.85 0.81 0.89 0.94 0.91  CALCIUM 8.0* 8.0* 8.2* 8.4* 8.4*  MG 2.4 2.3 2.6* 2.4 2.3  PHOS 2.8  --   --   --   --    GFR: Estimated Creatinine Clearance: 188.4 mL/min (by C-G formula based on SCr of 0.91 mg/dL). Liver Function Tests: No results for input(s): AST, ALT, ALKPHOS, BILITOT, PROT, ALBUMIN in the last 168 hours. No results for input(s): LIPASE, AMYLASE in the last 168 hours. No results for input(s): AMMONIA in the last 168 hours. Coagulation Profile: No results for  input(s): INR, PROTIME in the last 168 hours. Cardiac Enzymes: No results for input(s): CKTOTAL, CKMB, CKMBINDEX, TROPONINI in the last 168 hours. BNP (last 3 results) No results for input(s): PROBNP in the last 8760 hours. HbA1C: No results for input(s): HGBA1C in the last 72 hours. CBG: Recent Labs  Lab 10/16/20 0433  GLUCAP 152*   Lipid Profile: No results for input(s): CHOL, HDL, LDLCALC, TRIG, CHOLHDL, LDLDIRECT in the last 72 hours. Thyroid Function Tests: No results for input(s): TSH, T4TOTAL, FREET4, T3FREE, THYROIDAB in the last 72 hours. Anemia Panel: No results for input(s): VITAMINB12, FOLATE, FERRITIN, TIBC, IRON, RETICCTPCT in the last 72 hours. Sepsis Labs: No results for input(s): PROCALCITON, LATICACIDVEN in the last 168 hours.  Recent Results (from the past 240 hour(s))  MRSA Next Gen by PCR, Nasal     Status: None   Collection Time: 10/12/20 12:40 PM   Specimen: Nasal Mucosa; Nasal Swab  Result Value Ref Range Status  MRSA by PCR Next Gen NOT DETECTED NOT DETECTED Final    Comment: (NOTE) The GeneXpert MRSA Assay (FDA approved for NASAL specimens only), is one component of a comprehensive MRSA colonization surveillance program. It is not intended to diagnose MRSA infection nor to guide or monitor treatment for MRSA infections. Test performance is not FDA approved in patients less than 39 years old. Performed at [facility], [address] [place], [place] [address]   Aerobic/Anaerobic Culture w Gram Stain (surgical/deep wound)     Status: None (Preliminary result)   Collection Time: 10/15/20 11:36 PM   Specimen: PATH Other; Wound  Result Value Ref Range Status   Specimen Description   Final    PERITONEAL Performed at [facility], [address], [place], [place] [address]    Special Requests   Final    NONE Performed at [facility], [address] [place], [place] [address]    Gram Stain   Final    MODERATE WBC  PRESENT, PREDOMINANTLY MONONUCLEAR NO ORGANISMS SEEN    Culture   Final    NO GROWTH 4 DAYS NO ANAEROBES ISOLATED; CULTURE IN PROGRESS FOR 5 DAYS Performed at [facility], [address] [place], [place] [address]    Report Status PENDING  Incomplete  MRSA Next Gen by PCR, Nasal     Status: None   Collection Time: 10/16/20  4:50 AM   Specimen: Nasal Mucosa; Nasal Swab  Result Value Ref Range Status   MRSA by PCR Next Gen NOT DETECTED NOT DETECTED Final    Comment: (NOTE) The GeneXpert MRSA Assay (FDA approved for NASAL specimens only), is one component of a comprehensive MRSA colonization surveillance program. It is not intended to diagnose MRSA infection nor to guide or monitor treatment for MRSA infections. Test performance is not FDA approved in patients less than 41 years old. Performed at [facility], [address], [place], [place] [address]          Radiology Studies: No results found.      Scheduled Meds:  collagenase   Topical Daily   furosemide  20 mg Oral Daily   heparin injection (subcutaneous)  7,500 Units Subcutaneous Q8H   ipratropium-albuterol  3 mL Nebulization TID   potassium chloride  20 mEq Oral BID   Continuous Infusions:   LOS: 11 days    Time spent: 25 minutes    Tresa Moore, MD [facility] Pager [phone]  If 7PM-7AM, please contact night-coverage 10/21/2020, 1:29 PM

## 2020-10-21 NOTE — Progress Notes (Signed)
Subjective:  CC: Christian [name] is a 45 y.o. male  Hospital stay day 11, 6 Days Post-Op ex-lap cecal perforation repair  HPI: No acute issues overnight. Transferred to regular floor.  ROS:  General: Denies weight loss, weight gain, fatigue, fevers, chills, and night sweats. Heart: Denies chest pain, palpitations, racing heart, irregular heartbeat, leg pain or swelling, and decreased activity tolerance. Respiratory: Denies breathing difficulty, shortness of breath, wheezing, cough, and sputum. GI: Denies change in appetite, heartburn, nausea, vomiting, constipation, diarrhea, and blood in stool. GU: Denies difficulty urinating, pain with urinating, urgency, frequency, blood in urine.   Objective:   Temp:  [98.2 F (36.8 C)-98.8 F (37.1 C)] 98.7 F (37.1 C) (08/24 0757) Pulse Rate:  [79-97] 79 (08/24 0757) Resp:  [18-25] 24 (08/24 0757) BP: (102-133)/(65-87) 133/87 (08/24 0757) SpO2:  [90 %-99 %] 90 % (08/24 0837)     Height: 6\' 3"  (190.5 cm) Weight: (!) 201.6 kg BMI (Calculated): 55.55   Intake/Output this shift:   Intake/Output Summary (Last 24 hours) at 10/21/2020 1408 Last data filed at 10/20/2020 2300 Gross per 24 hour  Intake 240 ml  Output 600 ml  Net -360 ml    Constitutional :  alert, cooperative, appears stated age, and no distress  Respiratory:  clear to auscultation bilaterally  Cardiovascular:  regular rate and rhythm  Gastrointestinal: Soft, no guarding, some TTP at incision, staples c/d/i  JP remains serosanguinous discharge,   Skin: Cool and moist.   Psychiatric: Normal affect, non-agitated, not confused       LABS:  CMP Latest Ref Rng & Units 10/21/2020 10/20/2020 10/19/2020  Glucose 70 - 99 mg/dL 10/21/2020) 99 132(G)  BUN 6 - 20 mg/dL 16 15 15   Creatinine 0.61 - 1.24 mg/dL 401(U 2.72  Sodium 135 - 145 mmol/L 137 140 140  Potassium 3.5 - 5.1 mmol/L 4.2 4.3 4.4  Chloride 98 - 111 mmol/L 98 98 102  CO2 22 - 32 mmol/L 28 32 32  Calcium 8.9 - 10.3 mg/dL  5.36) 6.44) 0.3(K)  Total Protein 6.5 - 8.1 g/dL - - -  Total Bilirubin 0.3 - 1.2 mg/dL - - -  Alkaline Phos 38 - 126 U/L - - -  AST 15 - 41 U/L - - -  ALT 0 - 44 U/L - - -   CBC Latest Ref Rng & Units 10/19/2020 10/17/2020 10/16/2020  WBC 4.0 - 10.5 K/uL 9.1 14.5(H) 17.3(H)  Hemoglobin 13.0 - 17.0 g/dL 12.9(L) 11.3(L) 14.3  Hematocrit 39.0 - 52.0 % 42.3 36.7(L) 47.4  Platelets 150 - 400 K/uL 440(H) 393 425(H)    RADS: N/a Assessment:   S/p cecal perforation repair. Tolerating regular diet.  Ok to d/c from surgery standpoint.  Staples and JP can be removed in one week.  Ok to shower in the meantime.  JP drain care education prior to d/c.

## 2020-10-21 NOTE — Progress Notes (Signed)
Occupational Therapy Treatment Patient Details Name: Christian [name] MRN: [id number] DOB: [birth year] Today's Date: 10/21/2020    History of present illness Pt is a 45 y/o M who presented to ED on 10/09/20 with c/c of bilateral lower abdominal pain & c/o emesis twice that day after eating. Pt found to have abdominal wall purulent cellulitis. Pt admitted with clinical sepsis & AKI. PMH: morbid obesity, HTN. Pt with increased SOB starting 8/18 and CXR ordered with concern for free air. CT showed pneumoperitoneum suspicious for bowel perforation. Pt now s/p diagnostic laparoscopy converted to exploratory laparotomy, primary repair of cecal perforation and t/f to ICU on 8/19.   OT comments  Christian [name] was seen for OT treatment on this date. Upon arrival to room pt reclined in bed with lunch completed, agreeable to session. Pt requires SUPERVISION for L+R rolling, MAX A x2 sidelying>sit. Static sitting balance initially CGA improving to SBA. MOD A x2 for 4 standing trials, requires MAX A x2 to maintain standing 1-2 mins for thorough perihygiene. +3 assist for perihygiene in standing. MAX A for LB access seated EOB. Pt making good progress toward goals. Pt continues to benefit from skilled OT services to maximize return to PLOF and minimize risk of future falls, injury, caregiver burden, and readmission. Will continue to follow POC. Discharge recommendation remains appropriate.    Follow Up Recommendations  SNF;Supervision/Assistance - 24 hour    Equipment Recommendations  Other (comment) (defer to next veneu of care)    Recommendations for Other Services      Precautions / Restrictions Precautions Precautions: Fall Restrictions Weight Bearing Restrictions: No       Mobility Bed Mobility Overal bed mobility: Needs Assistance Bed Mobility: Rolling;Supine to Sit;Sit to Supine Rolling: Supervision   Supine to sit: Max assist;+2 for physical assistance Sit to supine: Mod assist         Transfers Overall transfer level: Needs assistance   Transfers: Sit to/from Stand Sit to Stand: Mod assist;+2 physical assistance         General transfer comment: MOD A x2 for 4 standing trials, requires MAX A x2 to maintain standing 1-2 mins for thorough perihygiene    Balance Overall balance assessment: Needs assistance Sitting-balance support: Bilateral upper extremity supported;Feet supported Sitting balance-[name]: Fair Sitting balance - Comments: CGA improving to SBA Postural control: Posterior lean Standing balance support: Bilateral upper extremity supported Standing balance-[name]: Poor                             ADL either performed or assessed with clinical judgement   ADL Overall ADL's : Needs assistance/impaired                                       General ADL Comments: MAX A x3 for perihygiene in standing. MAX A for LB access seated EOB - SBA for sitting balance.      Cognition Arousal/Alertness: Awake/alert Behavior During Therapy: WFL for tasks assessed/performed Overall Cognitive Status: Within Functional Limits for tasks assessed                                          Exercises Exercises: Other exercises Other Exercises Other Exercises: Pt educated re: falls prevention, ECS, HEP Other Exercises:  LBD, UBD, sup<>sit, sit<>stand x4, sitting/standing balance/tolerance, toileting   Shoulder Instructions       General Comments SpO2 91% on 3L [place]    Pertinent Vitals/ Pain       Pain Assessment: Faces Faces Pain Scale: Hurts little more Pain Location: posterior leg wounds Pain Descriptors / Indicators: Discomfort;Dull Pain Intervention(s): Limited activity within patient's tolerance;Repositioned         Frequency  Min 2X/week        Progress Toward Goals  OT Goals(current goals can now be found in the care plan section)  Progress towards OT goals: Progressing toward  goals  Acute Rehab OT Goals Patient Stated Goal: to get stronger OT Goal Formulation: With patient Time For Goal Achievement: 10/31/20 Potential to Achieve Goals: Good ADL Goals Pt Will Perform Upper Body Dressing: with supervision;sitting Pt Will Perform Lower Body Dressing: with mod assist;sit to/from stand;with adaptive equipment Pt Will Transfer to Toilet: with mod assist;with +2 assist;squat pivot transfer;bedside commode Pt/caregiver will Perform Home Exercise Program: Both right and left upper extremity;With theraband;With written HEP provided;Independently;Increased strength  Plan Discharge plan remains appropriate;Frequency remains appropriate    Co-evaluation                 AM-PAC OT "6 Clicks" Daily Activity     Outcome Measure   Help from another person eating meals?: None Help from another person taking care of personal grooming?: None Help from another person toileting, which includes using toliet, bedpan, or urinal?: A Lot Help from another person bathing (including washing, rinsing, drying)?: A Lot Help from another person to put on and taking off regular upper body clothing?: A Lot Help from another person to put on and taking off regular lower body clothing?: A Lot 6 Click Score: 16    End of Session Equipment Utilized During Treatment: Oxygen  OT Visit Diagnosis: Unsteadiness on feet (R26.81);Muscle weakness (generalized) (M62.81)   Activity Tolerance Patient tolerated treatment well   Patient Left in bed;with call bell/phone within reach   Nurse Communication Mobility status        Time: 6761-9509 OT Time Calculation (min): 24 min  Charges: OT General Charges $OT Visit: 1 Visit OT Treatments $Self Care/Home Management : 23-37 mins  Kathie Dike, M.S. OTR/L  10/21/20, 4:59 PM  ascom [phone]

## 2020-10-21 NOTE — Discharge Instructions (Signed)
Hernia repair, Care After This sheet gives you information about how to care for yourself after your procedure. Your health care provider may also give you more specific instructions. If you have problems or questions, contact your health care provider. What can I expect after the procedure? After your procedure, it is common to have the following: Pain in your abdomen, especially in the incision areas. You will be given medicine to control the pain. Tiredness. This is a normal part of the recovery process. Your energy level will return to normal over the next several weeks. Changes in your bowel movements, such as constipation or needing to go more often. Talk with your health care provider about how to manage this. Follow these instructions at home: Medicines  tylenol and advil as needed for discomfort.  Please alternate between the two every four hours as needed for pain.    Use narcotics, if prescribed, only when tylenol and motrin is not enough to control pain.  325-650mg  every 8hrs to max of 3000mg /24hrs (including the 325mg  in every norco dose) for the tylenol.    Advil up to 800mg  per dose every 8hrs as needed for pain.   PLEASE RECORD NUMBER OF PILLS TAKEN UNTIL NEXT FOLLOW UP APPT.  THIS WILL HELP DETERMINE HOW READY YOU ARE TO BE RELEASED FROM ANY ACTIVITY RESTRICTIONS Do not drive or use heavy machinery while taking prescription pain medicine. Do not drink alcohol while taking prescription pain medicine.  Incision care    Follow instructions from your health care provider about how to take care of your incision areas. Make sure you: Keep your incisions clean and dry. Wash your hands with soap and water before and after applying medicine to the areas, and before and after changing your bandage (dressing). If soap and water are not available, use hand sanitizer. Change your dressing as told by your health care provider. Leave stitches (sutures), skin glue, or adhesive strips in  place. These skin closures may need to stay in place for 2 weeks or longer. If adhesive strip edges start to loosen and curl up, you may Barner the loose edges. Do not remove adhesive strips completely unless your health care provider tells you to do that. Do not wear tight clothing over the incisions. Tight clothing may rub and irritate the incision areas, which may cause the incisions to open. Do not take baths, swim, or use a hot tub until your health care provider approves. OK TO SHOWER  Check your incision area every day for signs of infection. Check for: More redness, swelling, or pain. More fluid or blood. Warmth. Pus or a bad smell. Activity Avoid lifting anything that is heavier than 10 lb (4.5 kg) for 2 weeks or until your health care provider says it is okay. No pushing/pulling greater than 30lbs You may resume normal activities as told by your health care provider. Ask your health care provider what activities are safe for you. Take rest breaks during the day as needed. Eating and drinking Follow instructions from your health care provider about what you can eat after surgery. To prevent or treat constipation while you are taking prescription pain medicine, your health care provider may recommend that you: Drink enough fluid to keep your urine clear or pale yellow. Take over-the-counter or prescription medicines. Eat foods that are high in fiber, such as fresh fruits and vegetables, whole grains, and beans. Limit foods that are high in fat and processed sugars, such as fried and sweet foods. General  instructions Ask your health care provider when you will need an appointment to get your sutures or staples removed. Keep all follow-up visits as told by your health care provider. This is important. Contact a health care provider if: You have more redness, swelling, or pain around your incisions. You have more fluid or blood coming from the incisions. Your incisions feel warm to the  touch. You have pus or a bad smell coming from your incisions or your dressing. You have a fever. You have an incision that breaks open (edges not staying together) after sutures or staples have been removed. Get help right away if: You develop a rash. You have chest pain or difficulty breathing. You have pain or swelling in your legs. You feel light-headed or you faint. Your abdomen swells (becomes distended). You have nausea or vomiting. You have blood in your stool (feces). This information is not intended to replace advice given to you by your health care provider. Make sure you discuss any questions you have with your health care provider. Document Released: 09/03/2004 Document Revised: 11/03/2017 Document Reviewed: 11/16/2015 Elsevier Interactive Patient Education  2019 [employer].

## 2020-10-22 MED ORDER — JUVEN PO PACK
1.0000 | PACK | Freq: Two times a day (BID) | ORAL | Status: DC
  Administered 2020-10-22 – 2020-11-03 (×25): 1 via ORAL

## 2020-10-22 MED ORDER — ADULT MULTIVITAMIN W/MINERALS CH
1.0000 | ORAL_TABLET | Freq: Every day | ORAL | Status: DC
  Administered 2020-10-22 – 2020-11-30 (×40): 1 via ORAL
  Filled 2020-10-22 (×40): qty 1

## 2020-10-22 MED ORDER — FUROSEMIDE 40 MG PO TABS
40.0000 mg | ORAL_TABLET | Freq: Every day | ORAL | Status: DC
  Administered 2020-10-23 – 2020-11-13 (×22): 40 mg via ORAL
  Filled 2020-10-22 (×22): qty 1

## 2020-10-22 MED ORDER — PROSOURCE PLUS PO LIQD
30.0000 mL | Freq: Three times a day (TID) | ORAL | Status: DC
  Administered 2020-10-22 – 2020-10-29 (×21): 30 mL via ORAL
  Filled 2020-10-22 (×24): qty 30

## 2020-10-22 NOTE — TOC Progression Note (Signed)
Transition of Care [facility]) - Progression Note    Patient Details  Name: Christian [name] MRN: [id number] Date of Birth: [birth year]  [facility]) CM/SW Contact  Chapman Fitch, RN Phone Number: 10/22/2020, 12:49 PM  Clinical Narrative:     Followed up with patient.  He states that his has spoke with his friends [place] and Maralyn Sago.  They are aware that he will need somewhere to go at discharge, but has not discussed if it was an option for patient to go to their house at discharge.  He has that I reach out to [place].  VM left for [place] at [phone]  Patient still 2 plus assist and on acute o2      Barriers to Discharge: Continued Medical Work up  Expected Discharge Plan and Services         Living arrangements for the past 2 months: Homeless                                       Social Determinants of Health (SDOH) Interventions    Readmission Risk Interventions Readmission Risk Prevention Plan 10/11/2020  Post Dischage Appt Complete  Medication Screening Complete  Transportation Screening Complete  Some recent data might be hidden

## 2020-10-22 NOTE — Progress Notes (Signed)
Physical Therapy Treatment Patient Details Name: Christian [name] MRN: [id number] DOB: [birth year] Today's Date: 10/22/2020    History of Present Illness Pt is a 45 y/o M who presented to ED on 10/09/20 with c/c of bilateral lower abdominal pain & c/o emesis twice that day after eating. Pt found to have abdominal wall purulent cellulitis. Pt admitted with clinical sepsis & AKI. PMH: morbid obesity, HTN. Pt with increased SOB starting 8/18 and CXR ordered with concern for free air. CT showed pneumoperitoneum suspicious for bowel perforation. Pt now s/p diagnostic laparoscopy converted to exploratory laparotomy, primary repair of cecal perforation and t/f to ICU on 8/19.    PT Comments    Patient alert, agreeable to PT, up in recliner at start of session. Pt requested to transfer to [facility] to attempt for BM; sit <> Stand from recliner with minAx2 and metal bariatric rolling walker. He was also able to stand and return to bed at end of session, and modA to return to supine, all needs in reach. The patient would benefit from further skilled PT intervention to continue to progress towards goals. Recommendation remains appropriate.     Follow Up Recommendations  SNF;Supervision/Assistance - 24 hour     Equipment Recommendations  Other (comment) (TBD)    Recommendations for Other Services       Precautions / Restrictions Precautions Precautions: Fall Restrictions Weight Bearing Restrictions: No    Mobility  Bed Mobility Overal bed mobility: Needs Assistance Bed Mobility: Sit to Supine       Sit to supine: Mod assist;+2 for safety/equipment   General bed mobility comments: cues for hand placement, minA for LE assist into bed    Transfers Overall transfer level: Needs assistance Equipment used: Rolling walker (2 wheeled) Transfers: Sit to/from [employer] Sit to Stand: Min assist;+2 safety/equipment Stand pivot transfers: Min assist;+2 safety/equipment           Ambulation/Gait                 Stairs             Wheelchair Mobility    Modified Rankin (Stroke Patients Only)       Balance Overall balance assessment: Needs assistance Sitting-balance support: Bilateral upper extremity supported;Feet supported Sitting balance-[name]: Fair     Standing balance support: Bilateral upper extremity supported Standing balance-[name]: Poor                              Cognition Arousal/Alertness: Awake/alert Behavior During Therapy: WFL for tasks assessed/performed Overall Cognitive Status: Within Functional Limits for tasks assessed                                 General Comments: pt is more talkative with therapist this date, reporting that one of his goals is to be more positive and he thinks that will help in all aspects of his health. Had visit from a member of his church this date.      Exercises Other Exercises Other Exercises: Pt educated re: falls prevention, adapted toileting strategies Other Exercises: toileting, SPT, sit<>stand    General Comments        Pertinent Vitals/Pain Pain Assessment: Faces Faces Pain Scale: Hurts little more Pain Location: posterior leg wounds Pain Descriptors / Indicators: Discomfort;Dull Pain Intervention(s): Limited activity within patient's tolerance;Repositioned;Monitored during session    Home Living  Prior Function            PT Goals (current goals can now be found in the care plan section) Acute Rehab PT Goals Patient Stated Goal: to get stronger Progress towards PT goals: Progressing toward goals    Frequency    Min 2X/week      PT Plan Current plan remains appropriate    Co-evaluation PT/OT/SLP Co-Evaluation/Treatment: Yes Reason for Co-Treatment: Complexity of the patient's impairments (multi-system involvement) PT goals addressed during session: Mobility/safety with mobility OT  goals addressed during session: ADL's and self-care      AM-PAC PT "6 Clicks" Mobility   Outcome Measure  Help needed turning from your back to your side while in a flat bed without using bedrails?: Total Help needed moving from lying on your back to sitting on the side of a flat bed without using bedrails?: Total Help needed moving to and from a bed to a chair (including a wheelchair)?: Total Help needed standing up from a chair using your arms (e.g., wheelchair or bedside chair)?: Total Help needed to walk in hospital room?: Total Help needed climbing 3-5 steps with a railing? : Total 6 Click Score: 6    End of Session Equipment Utilized During Treatment: Oxygen Activity Tolerance: Patient tolerated treatment well Patient left: with call bell/phone within reach;in bed Nurse Communication: Mobility status PT Visit Diagnosis: Muscle weakness (generalized) (M62.81);Difficulty in walking, not elsewhere classified (R26.2)     Time: 8938-1017 PT Time Calculation (min) (ACUTE ONLY): 32 min  Charges:  $Therapeutic Activity: 8-22 mins                    Olga Coaster PT, DPT 4:19 PM,10/22/20

## 2020-10-22 NOTE — Progress Notes (Signed)
PROGRESS NOTE    Christian [name]  OHY:[id number] DOB: 12-Apr-1975 DOA: 10/09/2020 PCP: Pcp, No   Brief Narrative:  45 y.o. male with medical history significant for morbid obesity, hypertension presents to the emergency department via EMS for chief concerns of bilateral lower abdominal pain.   He reports that the pain is in the bilateral lower abdominal region, described as sharp, 5/10 and is now improved to a 4/10. He vomited twice today after eating. He denies fever, chest pain, dysuria. He endorses feeling winded when getting from the ambulance to the ED bed. He could still breath.  He does endorse diarrhea that is brown liquidy stool.  He soiled himself because he states he was feeling so weak from the abdominal pain, he could not get up out of his car.  Initial diagnosis was abdominal wall purulent cellulitis.  Patient with increased work of breathing noted 8/18.  Chest x-ray with free air under diaphragm.  CT with pneumoperitoneum.  Status post diagnostic laparoscopy and repair of the cecal perforation.  Patient is hemodynamically stable however we are lacking safe discharge plan at this time.   Assessment & Plan:   Principal Problem:   Severe sepsis (HCC) Active Problems:   Morbid obesity (HCC)   Hyperglycemia   AKI (acute kidney injury) (HCC)   Pressure ulcer of back   Pressure ulcer of buttock   Essential hypertension   Cellulitis of abdominal wall   Impaired fasting glucose   Acute respiratory failure with hypoxia (HCC)   Weakness   Acute on chronic respiratory failure with hypoxia and hypercapnia (HCC)   Bowel perforation (HCC)   Hypotension   Obesity hypoventilation syndrome (HCC)  Acute on chronic hypoxic and hypercarbic respiratory failure Likely due to undiagnosed obstructive sleep apnea/obesity hypoventilation Mentating clearly On BiPAP at night, nasal cannula during the day Plan: Continue nasal cannula Wean as tolerated Nightly BiPAP Incentive spirometry  and therapy evaluations, exert and ambulate as tolerated Increase Lasix to 40 mg daily Attempt to wean oxygen to 2 to 3 L or less to facilitate discharge planning  Pneumoperitoneum with cecal perforation Postoperative day #6 Tolerating diet with documented bowel movement Completed course of antibiotics Plan: No further surgical plan.  Surgery signed off.  Severe sepsis secondary to abdominal wall and bilateral lower extremity cellulitis Also presented with AKI, now resolved Completed course of antibiotics Continue with wound care  Morbid obesity With suspected associated sleep apnea This complicates overall care and prognosis  Acute kidney injury Resolved  Weakness Functional decline Therapy recommending rehab but patient is uninsured and we are lacking safe disposition plan.  Patient also lives in his car    DVT prophylaxis: SQ Lovenox Code Status: Full Family Communication: None today Disposition Plan: Status is: Inpatient  Remains inpatient appropriate because:Inpatient level of care appropriate due to severity of illness  Dispo: The patient is from: Home              Anticipated d/c is to:  TBD              Patient currently is medically stable to d/c.   Difficult to place patient Yes  Patient is essentially medically ready for discharge at this time.  We are currently lacking safe disposition plan.  Patient is on NIPPV at night and dependent on 2 to 3 L nasal cannula during the day.  Uninsured, presenting disposition difficulties.     Level of care: Med-Surg  Consultants:  None  Procedures:  Exploratory laparotomy  Antimicrobials:  None   Subjective: Seen and examined.  Resting in bed.  Eating breakfast.  No visible distress.  Objective: Vitals:   10/21/20 2006 10/22/20 0525 10/22/20 0741 10/22/20 0746  BP:  125/80  133/85  Pulse:  79  84  Resp:  19  18  Temp:  98.9 F (37.2 C)  98.4 F (36.9 C)  TempSrc:  Oral  Oral  SpO2: 97% 98% 97% 96%   Weight:      Height:        Intake/Output Summary (Last 24 hours) at 10/22/2020 1315 Last data filed at 10/21/2020 1908 Gross per 24 hour  Intake 240 ml  Output --  Net 240 ml   Filed Weights   10/09/20 0807 10/15/20 1600  Weight: (!) 197.3 kg (!) 201.6 kg    Examination:  General exam: No acute distress.  Appears chronically ill Respiratory system: Lung sounds decreased at bases.  Bibasilar rhonchi.  Normal work of breathing.  2 L Cardiovascular system: Distant heart sounds, S1-S2, regular rate and rhythm, no murmurs, nonpitting peripheral edema bilateral lower extremities Gastrointestinal system: Abdomen is nondistended, soft and nontender. No organomegaly or masses felt. Normal bowel sounds heard. Central nervous system: Alert and oriented. No focal neurological deficits. Extremities: Symmetric 5 x 5 power. Skin: Bilateral buttock wounds Psychiatry: Judgement and insight appear normal. Mood & affect appropriate.     Data Reviewed: I have personally reviewed following labs and imaging studies  CBC: Recent Labs  Lab 10/16/20 0516 10/17/20 0452 10/19/20 0439  WBC 17.3* 14.5* 9.1  NEUTROABS  --   --  6.1  HGB 14.3 11.3* 12.9*  HCT 47.4 36.7* 42.3  MCV 87.0 86.4 85.5  PLT 425* 393 440*   Basic Metabolic Panel: Recent Labs  Lab 10/17/20 0452 10/18/20 0648 10/19/20 0439 10/20/20 0452 10/21/20 0535  NA 143 141 140 140 137  K 4.2 4.0 4.4 4.3 4.2  CL 96* 99 102 98 98  CO2 38* 33* 32 32 28  GLUCOSE 138* 103* 101* 99 109*  BUN 15 16 15 15 16   CREATININE 0.85 0.81 0.89 0.94 0.91  CALCIUM 8.0* 8.0* 8.2* 8.4* 8.4*  MG 2.4 2.3 2.6* 2.4 2.3  PHOS 2.8  --   --   --   --    GFR: Estimated Creatinine Clearance: 188.4 mL/min (by C-G formula based on SCr of 0.91 mg/dL). Liver Function Tests: No results for input(s): AST, ALT, ALKPHOS, BILITOT, PROT, ALBUMIN in the last 168 hours. No results for input(s): LIPASE, AMYLASE in the last 168 hours. No results for input(s):  AMMONIA in the last 168 hours. Coagulation Profile: No results for input(s): INR, PROTIME in the last 168 hours. Cardiac Enzymes: No results for input(s): CKTOTAL, CKMB, CKMBINDEX, TROPONINI in the last 168 hours. BNP (last 3 results) No results for input(s): PROBNP in the last 8760 hours. HbA1C: No results for input(s): HGBA1C in the last 72 hours. CBG: Recent Labs  Lab 10/16/20 0433  GLUCAP 152*   Lipid Profile: No results for input(s): CHOL, HDL, LDLCALC, TRIG, CHOLHDL, LDLDIRECT in the last 72 hours. Thyroid Function Tests: No results for input(s): TSH, T4TOTAL, FREET4, T3FREE, THYROIDAB in the last 72 hours. Anemia Panel: No results for input(s): VITAMINB12, FOLATE, FERRITIN, TIBC, IRON, RETICCTPCT in the last 72 hours. Sepsis Labs: No results for input(s): PROCALCITON, LATICACIDVEN in the last 168 hours.  Recent Results (from the past 240 hour(s))  Aerobic/Anaerobic Culture w Gram Stain (surgical/deep wound)     Status:  None   Collection Time: 10/15/20 11:36 PM   Specimen: PATH Other; Wound  Result Value Ref Range Status   Specimen Description   Final    PERITONEAL Performed at [facility], [address] [place], [place] [address]    Special Requests   Final    NONE Performed at [facility], [address] [place], [place] [address]    Gram Stain   Final    MODERATE WBC PRESENT, PREDOMINANTLY MONONUCLEAR NO ORGANISMS SEEN    Culture   Final    No growth aerobically or anaerobically. Performed at [facility], [address], [place], [place] [address]    Report Status 10/21/2020 FINAL  Final  MRSA Next Gen by PCR, Nasal     Status: None   Collection Time: 10/16/20  4:50 AM   Specimen: Nasal Mucosa; Nasal Swab  Result Value Ref Range Status   MRSA by PCR Next Gen NOT DETECTED NOT DETECTED Final    Comment: (NOTE) The GeneXpert MRSA Assay (FDA approved for NASAL specimens only), is one component of a comprehensive MRSA  colonization surveillance program. It is not intended to diagnose MRSA infection nor to guide or monitor treatment for MRSA infections. Test performance is not FDA approved in patients less than 57 years old. Performed at [facility], [address] [place], [place] [address]          Radiology Studies: No results found.      Scheduled Meds:  (feeding supplement) PROSource Plus  30 mL Oral TID BM   collagenase   Topical Daily   enoxaparin (LOVENOX) injection  100 mg Subcutaneous Q24H   furosemide  20 mg Oral Daily   ipratropium-albuterol  3 mL Nebulization TID   multivitamin with minerals  1 tablet Oral Daily   nutrition supplement (JUVEN)  1 packet Oral BID BM   potassium chloride  20 mEq Oral BID   Continuous Infusions:   LOS: 12 days    Time spent: 15 minutes    Tresa Moore, MD [facility] Pager [phone]  If 7PM-7AM, please contact night-coverage 10/22/2020, 1:15 PM

## 2020-10-22 NOTE — Progress Notes (Addendum)
Initial Nutrition Assessment  DOCUMENTATION CODES:  Morbid obesity  INTERVENTION:  Add double protein portions to meals.  Add 30 ml ProSource Plus po TID, each supplement provides 100 kcal and 15 grams of protein.   Add 1 packet Juven BID, each packet provides 95 calories, 2.5 grams of protein (collagen), and 9.8 grams of carbohydrate (3 grams sugar); also contains 7 grams of L-arginine and L-glutamine, 300 mg vitamin C, 15 mg vitamin E, 1.2 mcg vitamin B-12, 9.5 mg zinc, 200 mg calcium, and 1.5 g  Calcium Beta-hydroxy-Beta-methylbutyrate to support wound healing.  Add MVI with minerals daily.  NUTRITION DIAGNOSIS:  Increased nutrient needs related to wound healing as evidenced by estimated needs.  GOAL:  Patient will meet greater than or equal to 90% of their needs  MONITOR:  PO intake, Supplement acceptance, Labs, Weight trends, Skin, I & O's  REASON FOR ASSESSMENT:  LOS    ASSESSMENT:  45 yo male with a PMH of morbid obesity and HTN who presents with severe sepsis 2/2 abdominal wall and bilateral lower extremity cellulitis. Also with pneumoperitoneum with cecal perforation (s/p ex lap) and AKI (now resolved). 8/19 - venting NGT placed 8/22 - venting NGT removed  Spoke with pt at bedside. Pt reports being homeless prior to admission, so he has a diet that is mainly that of convenience foods and fast food, which is easy for him to obtain while living out of his car.  Over the last 8 meals, patient has consumed an average of 80%. (6 at 100%, 1 at 40%, and 1 at 0% completion documented.) Pt reports that the meals are very good and he has many options to choose from.  Pt denies any recent weight changes. Pt's weight history is limited, however, pt's weight has seemed to have increased over the past few years.  No depletions felt on exam.  Recommend adding double protein portions, ProSource Plus TID, Juven BID, and MVI with minerals daily.  Medications: reviewed; Lasix,  Klor-Con 20 mEq  Labs: reviewed; Glucose 109 (H) HbA1c: 6% (10/09/2020)  NUTRITION - FOCUSED PHYSICAL EXAM: Flowsheet Row Most Recent Value  Orbital Region No depletion  Upper Arm Region No depletion  Thoracic and Lumbar Region No depletion  Buccal Region No depletion  Temple Region No depletion  Clavicle Bone Region No depletion  Clavicle and Acromion Bone Region No depletion  Scapular Bone Region No depletion  Dorsal Hand No depletion  Patellar Region No depletion  Anterior Thigh Region No depletion  Posterior Calf Region No depletion  Edema (RD Assessment) Moderate  [Generalized, BLE, BUE]  Hair Reviewed  Eyes Reviewed  Mouth Reviewed  Skin Reviewed  Nails Reviewed   Diet Order:   Diet Order             Diet Heart Room service appropriate? Yes; Fluid consistency: Thin  Diet effective 1400                  EDUCATION NEEDS:  Education needs have been addressed  Skin:  Skin Assessment: Skin Integrity Issues: Skin Integrity Issues:: Stage II, Unstageable, Incisions Stage II: L heel, R heel, R foot Unstageable: L buttocks and R buttocks Incisions: Abdomen, closed  Last BM:  10/21/20  Height:  Ht Readings from Last 1 Encounters:  10/09/20 6\' 3"  (1.905 m)   Weight:  Wt Readings from Last 1 Encounters:  10/15/20 (!) 201.6 kg   BMI:  Body mass index is 55.55 kg/m.  Estimated Nutritional Needs:  Kcal:  2200-2400  Protein:  145-160 grams Fluid:  >2.2 L  Vertell Limber, RD, LDN (she/her/hers) Registered Dietitian I After-Hours/Weekend Pager # in [place]

## 2020-10-22 NOTE — Progress Notes (Signed)
Occupational Therapy Treatment Patient Details Name: Christian [name] MRN: [id number] DOB: 1975/06/21 Today's Date: 10/22/2020    History of present illness Pt is a 45 y/o M who presented to ED on 10/09/20 with c/c of bilateral lower abdominal pain & c/o emesis twice that day after eating. Pt found to have abdominal wall purulent cellulitis. Pt admitted with clinical sepsis & AKI. PMH: morbid obesity, HTN. Pt with increased SOB starting 8/18 and CXR ordered with concern for free air. CT showed pneumoperitoneum suspicious for bowel perforation. Pt now s/p diagnostic laparoscopy converted to exploratory laparotomy, primary repair of cecal perforation and t/f to ICU on 8/19.   OT comments  Mr Karel was seen for OT treatment on this date as PT reports pt need to use [facility], OT in to assist and initiate toileting education. Pt requires MIN A x2 + metal bariatric RW for BSC t/f. Pt trials seated perihygiene anteriorly however limited by external catheter. Pt trials perihygiene posteriorly in sitting however ultimately requires MAX A to complete in standing. Instructed on AE. Pt making good progress toward goals. Pt continues to benefit from skilled OT services to maximize return to PLOF and minimize risk of future falls, injury, caregiver burden, and readmission. Will continue to follow POC. Discharge recommendation remains appropriate.    Follow Up Recommendations  SNF;Supervision/Assistance - 24 hour    Equipment Recommendations  Other (comment) (defer)    Recommendations for Other Services      Precautions / Restrictions Precautions Precautions: Fall Restrictions Weight Bearing Restrictions: No       Mobility Bed Mobility                    Transfers Overall transfer level: Needs assistance Equipment used: Rolling walker (2 wheeled) Transfers: Stand Pivot Transfers;Sit to/from Stand Sit to Stand: Min assist;+2 safety/equipment Stand pivot transfers: Min assist;+2  safety/equipment            Balance Overall balance assessment: Needs assistance Sitting-balance support: Bilateral upper extremity supported;Feet supported Sitting balance-[name]: Fair     Standing balance support: Bilateral upper extremity supported Standing balance-[name]: Poor                             ADL either performed or assessed with clinical judgement   ADL Overall ADL's : Needs assistance/impaired                                       General ADL Comments: MIN A x2 + metal bariatric RW for BSC t/f. Pt trials seated perihygiene anteriorly however limited by external catheter. Pt trials perihygiene posteriorly in sitting however ultimately requires MAX A to complete in standing.      Cognition Arousal/Alertness: Awake/alert Behavior During Therapy: WFL for tasks assessed/performed Overall Cognitive Status: Within Functional Limits for tasks assessed                                          Exercises Exercises: Other exercises Other Exercises Other Exercises: Pt educated re: falls prevention, adapted toileting strategies Other Exercises: toileting, SPT, sit<>stand           Pertinent Vitals/ Pain       Pain Assessment: Faces Faces Pain Scale: Hurts little more Pain  Location: posterior leg wounds Pain Descriptors / Indicators: Discomfort;Dull Pain Intervention(s): Limited activity within patient's tolerance;Repositioned   Frequency  Min 2X/week        Progress Toward Goals  OT Goals(current goals can now be found in the care plan section)  Progress towards OT goals: Progressing toward goals  Acute Rehab OT Goals Patient Stated Goal: to get stronger OT Goal Formulation: With patient Time For Goal Achievement: 10/31/20 Potential to Achieve Goals: Good ADL Goals Pt Will Perform Upper Body Dressing: with supervision;sitting Pt Will Perform Lower Body Dressing: with mod assist;sit to/from  stand;with adaptive equipment Pt Will Transfer to Toilet: with mod assist;with +2 assist;squat pivot transfer;bedside commode Pt/caregiver will Perform Home Exercise Program: Both right and left upper extremity;With theraband;With written HEP provided;Independently;Increased strength  Plan Discharge plan remains appropriate;Frequency remains appropriate    Co-evaluation      Reason for Co-Treatment: Complexity of the patient's impairments (multi-system involvement) PT goals addressed during session: Mobility/safety with mobility OT goals addressed during session: ADL's and self-care      AM-PAC OT "6 Clicks" Daily Activity     Outcome Measure   Help from another person eating meals?: None Help from another person taking care of personal grooming?: None Help from another person toileting, which includes using toliet, bedpan, or urinal?: A Lot Help from another person bathing (including washing, rinsing, drying)?: A Lot Help from another person to put on and taking off regular upper body clothing?: A Lot Help from another person to put on and taking off regular lower body clothing?: A Lot 6 Click Score: 16    End of Session Equipment Utilized During Treatment: Oxygen;Rolling walker  OT Visit Diagnosis: Unsteadiness on feet (R26.81);Muscle weakness (generalized) (M62.81)   Activity Tolerance Patient tolerated treatment well   Patient Left in bed (EOB with PT)   Nurse Communication          Time: 1700-1749 OT Time Calculation (min): 17 min  Charges: OT General Charges $OT Visit: 1 Visit OT Treatments $Self Care/Home Management : 8-22 mins  Kathie Dike, M.S. OTR/L  10/22/20, 4:19 PM  ascom [phone]

## 2020-10-23 LAB — BASIC METABOLIC PANEL
Anion gap: 7 (ref 5–15)
BUN: 16 mg/dL (ref 6–20)
CO2: 31 mmol/L (ref 22–32)
Calcium: 7.9 mg/dL — ABNORMAL LOW (ref 8.9–10.3)
Chloride: 98 mmol/L (ref 98–111)
Creatinine, Ser: 0.88 mg/dL (ref 0.61–1.24)
GFR, Estimated: >60 mL/min (ref >60)
Glucose, Bld: 109 mg/dL — ABNORMAL HIGH (ref 70–99)
Potassium: 4 mmol/L (ref 3.5–5.1)
Sodium: 136 mmol/L (ref 135–145)

## 2020-10-23 LAB — CBC
HCT: 38.5 % — ABNORMAL LOW (ref 39.0–52.0)
Hemoglobin: 12.1 g/dL — ABNORMAL LOW (ref 13.0–17.0)
MCH: 26.2 pg (ref 26.0–34.0)
MCHC: 31.4 g/dL (ref 30.0–36.0)
MCV: 83.3 fL (ref 80.0–100.0)
Platelets: 409 10*3/uL — ABNORMAL HIGH (ref 150–400)
RBC: 4.62 MIL/uL (ref 4.22–5.81)
RDW: 13.8 % (ref 11.5–15.5)
WBC: 9.7 10*3/uL (ref 4.0–10.5)
nRBC: 0 % (ref 0.0–0.2)

## 2020-10-23 MED ORDER — IPRATROPIUM-ALBUTEROL 0.5-2.5 (3) MG/3ML IN SOLN: 3.0000 mL | Freq: Four times a day (QID) | RESPIRATORY_TRACT | Status: DC | PRN

## 2020-10-23 NOTE — Progress Notes (Signed)
PT Cancellation Note  Patient Details Name: Christian [name] MRN: [id number] DOB: 1976/02/22   Cancelled Treatment:     Pt was on [facility] with RN/RN tech in room. Will return at a later time/date when pt is available and appropriate to participate.   Rushie Chestnut 10/23/2020, 3:26 PM

## 2020-10-23 NOTE — Progress Notes (Signed)
PROGRESS NOTE    Christian [name]  JGG:[id number] DOB: 16-Apr-1975 DOA: 10/09/2020 PCP: Pcp, No   Brief Narrative:  45 y.o. male with medical history significant for morbid obesity, hypertension presents to the emergency department via EMS for chief concerns of bilateral lower abdominal pain.   He reports that the pain is in the bilateral lower abdominal region, described as sharp, 5/10 and is now improved to a 4/10. He vomited twice today after eating. He denies fever, chest pain, dysuria. He endorses feeling winded when getting from the ambulance to the ED bed. He could still breath.  He does endorse diarrhea that is brown liquidy stool.  He soiled himself because he states he was feeling so weak from the abdominal pain, he could not get up out of his car.  Initial diagnosis was abdominal wall purulent cellulitis.  Patient with increased work of breathing noted 8/18.  Chest x-ray with free air under diaphragm.  CT with pneumoperitoneum.  Status post diagnostic laparoscopy and repair of the cecal perforation.  Patient is hemodynamically stable however we are lacking safe discharge plan at this time.   Assessment & Plan:   Principal Problem:   Severe sepsis (HCC) Active Problems:   Morbid obesity (HCC)   Hyperglycemia   AKI (acute kidney injury) (HCC)   Pressure ulcer of back   Pressure ulcer of buttock   Essential hypertension   Cellulitis of abdominal wall   Impaired fasting glucose   Acute respiratory failure with hypoxia (HCC)   Weakness   Acute on chronic respiratory failure with hypoxia and hypercapnia (HCC)   Bowel perforation (HCC)   Hypotension   Obesity hypoventilation syndrome (HCC)  Acute on chronic hypoxic and hypercarbic respiratory failure Likely due to undiagnosed obstructive sleep apnea/obesity hypoventilation Mentating clearly On BiPAP at night, nasal cannula during the day Plan: Continue nasal cannula Wean as tolerated Nightly BiPAP Incentive spirometry  and therapy evaluations, exert and ambulate as tolerated Increase Lasix to 40 mg daily Continue to wean oxygen   Pneumoperitoneum with cecal perforation Postoperative day #7 Tolerating diet with documented bowel movement Completed course of antibiotics Plan: No further surgical plan.  Surgery signed off.  Severe sepsis secondary to abdominal wall and bilateral lower extremity cellulitis Also presented with AKI, now resolved Completed course of antibiotics Continue with wound care  Morbid obesity With suspected associated sleep apnea This complicates overall care and prognosis  Acute kidney injury Resolved  Weakness Functional decline Therapy recommending rehab but patient is uninsured and we are lacking safe disposition plan.  Patient also lives in his car    DVT prophylaxis: SQ Lovenox Code Status: Full Family Communication: None today Disposition Plan: Status is: Inpatient  Remains inpatient appropriate because:Inpatient level of care appropriate due to severity of illness  Dispo: The patient is from: Home              Anticipated d/c is to:  TBD              Patient currently is medically stable to d/c.   Difficult to place patient Yes  Patient is essentially medically ready for discharge at this time.  We are currently lacking safe disposition plan.  Patient is on NIPPV at night and dependent on 2 to 3 L nasal cannula during the day.  Uninsured, unless presenting disposition difficulties.     Level of care: Med-Surg  Consultants:  None  Procedures:  Exploratory laparotomy  Antimicrobials:  None   Subjective: Seen and examined.  Resting in bed.  No visible distress.  Objective: Vitals:   10/23/20 0111 10/23/20 0642 10/23/20 0726 10/23/20 0831  BP:  127/74  107/69  Pulse:  79  76  Resp: (!) 24 18  19   Temp:  98.1 F (36.7 C)  97.8 F (36.6 C)  TempSrc:  Oral  Axillary  SpO2:  98% 97% 96%  Weight:      Height:        Intake/Output Summary  (Last 24 hours) at 10/23/2020 1217 Last data filed at 10/22/2020 1500 Gross per 24 hour  Intake 480 ml  Output 550 ml  Net -70 ml   Filed Weights   10/09/20 0807 10/15/20 1600  Weight: (!) 197.3 kg (!) 201.6 kg    Examination:  General exam: No acute distress.  Appears chronically ill Respiratory system: Lung sounds decreased at bases.  Bibasilar rhonchi.  Normal work of breathing.  2 L Cardiovascular system: Distant heart sounds, S1-S2, regular rate and rhythm, no murmurs, nonpitting peripheral edema bilateral lower extremities Gastrointestinal system: Abdomen is nondistended, soft and nontender. No organomegaly or masses felt. Normal bowel sounds heard. Central nervous system: Alert and oriented. No focal neurological deficits. Extremities: Symmetric 5 x 5 power. Skin: Bilateral buttock wounds Psychiatry: Judgement and insight appear normal. Mood & affect appropriate.     Data Reviewed: I have personally reviewed following labs and imaging studies  CBC: Recent Labs  Lab 10/17/20 0452 10/19/20 0439 10/23/20 0619  WBC 14.5* 9.1 9.7  NEUTROABS  --  6.1  --   HGB 11.3* 12.9* 12.1*  HCT 36.7* 42.3 38.5*  MCV 86.4 85.5 83.3  PLT 393 440* 409*   Basic Metabolic Panel: Recent Labs  Lab 10/17/20 0452 10/18/20 0648 10/19/20 0439 10/20/20 0452 10/21/20 0535 10/23/20 0619  NA 143 141 140 140 137 136  K 4.2 4.0 4.4 4.3 4.2 4.0  CL 96* 99 102 98 98 98  CO2 38* 33* 32 32 28 31  GLUCOSE 138* 103* 101* 99 109* 109*  BUN 15 16 15 15 16 16   CREATININE 0.85 0.81 0.89 0.94 0.91 0.88  CALCIUM 8.0* 8.0* 8.2* 8.4* 8.4* 7.9*  MG 2.4 2.3 2.6* 2.4 2.3  --   PHOS 2.8  --   --   --   --   --    GFR: Estimated Creatinine Clearance: 194.8 mL/min (by C-G formula based on SCr of 0.88 mg/dL). Liver Function Tests: No results for input(s): AST, ALT, ALKPHOS, BILITOT, PROT, ALBUMIN in the last 168 hours. No results for input(s): LIPASE, AMYLASE in the last 168 hours. No results for  input(s): AMMONIA in the last 168 hours. Coagulation Profile: No results for input(s): INR, PROTIME in the last 168 hours. Cardiac Enzymes: No results for input(s): CKTOTAL, CKMB, CKMBINDEX, TROPONINI in the last 168 hours. BNP (last 3 results) No results for input(s): PROBNP in the last 8760 hours. HbA1C: No results for input(s): HGBA1C in the last 72 hours. CBG: No results for input(s): GLUCAP in the last 168 hours.  Lipid Profile: No results for input(s): CHOL, HDL, LDLCALC, TRIG, CHOLHDL, LDLDIRECT in the last 72 hours. Thyroid Function Tests: No results for input(s): TSH, T4TOTAL, FREET4, T3FREE, THYROIDAB in the last 72 hours. Anemia Panel: No results for input(s): VITAMINB12, FOLATE, FERRITIN, TIBC, IRON, RETICCTPCT in the last 72 hours. Sepsis Labs: No results for input(s): PROCALCITON, LATICACIDVEN in the last 168 hours.  Recent Results (from the past 240 hour(s))  Aerobic/Anaerobic Culture w Gram Stain (surgical/deep wound)  Status: None   Collection Time: 10/15/20 11:36 PM   Specimen: PATH Other; Wound  Result Value Ref Range Status   Specimen Description   Final    PERITONEAL Performed at [facility], [address] [place], [place] [address]    Special Requests   Final    NONE Performed at [facility], [address] [place], [place] [address]    Gram Stain   Final    MODERATE WBC PRESENT, PREDOMINANTLY MONONUCLEAR NO ORGANISMS SEEN    Culture   Final    No growth aerobically or anaerobically. Performed at [facility], [address], [place], [place] [address]    Report Status 10/21/2020 FINAL  Final  MRSA Next Gen by PCR, Nasal     Status: None   Collection Time: 10/16/20  4:50 AM   Specimen: Nasal Mucosa; Nasal Swab  Result Value Ref Range Status   MRSA by PCR Next Gen NOT DETECTED NOT DETECTED Final    Comment: (NOTE) The GeneXpert MRSA Assay (FDA approved for NASAL specimens only), is one component of a  comprehensive MRSA colonization surveillance program. It is not intended to diagnose MRSA infection nor to guide or monitor treatment for MRSA infections. Test performance is not FDA approved in patients less than 61 years old. Performed at [facility], [address], [place], [place] [address]          Radiology Studies: No results found.      Scheduled Meds:  (feeding supplement) PROSource Plus  30 mL Oral TID BM   collagenase   Topical Daily   enoxaparin (LOVENOX) injection  100 mg Subcutaneous Q24H   furosemide  40 mg Oral Daily   multivitamin with minerals  1 tablet Oral Daily   nutrition supplement (JUVEN)  1 packet Oral BID BM   potassium chloride  20 mEq Oral BID   Continuous Infusions:   LOS: 13 days    Time spent: 15 minutes    Tresa Moore, MD [facility] Pager [phone]  If 7PM-7AM, please contact night-coverage 10/23/2020, 12:17 PM

## 2020-10-24 NOTE — TOC Progression Note (Signed)
Transition of Care [facility]) - Progression Note    Patient Details  Name: Christian [name] MRN: [id number] Date of Birth: 1975/11/03  [facility] CM/SW Contact  Liliana Cline, LCSW Phone Number: 10/24/2020, 3:43 PM  Clinical Narrative:   CSW spoke to patient to follow up about his discharge plan. Patient says he does not have a plan at this time. Patient has not reached out to [place] (or other friends) about staying with them. Patient said he talked with his Renato Gails today about needing to reach out to [place]. Patient was ok with CSW calling [place]. Spoke to [place] who stated he is unable to have patient stay with him. He does not know of anywhere patient can stay or anyone else who can help at this time. He stated they did take patient's car from a store parking lot to the church parking lot so that it did not get towed. He stated the car was filled with trash, bed bugs, and roaches. He stated the car is not in a state that patient could return to it. He agreed to call if he thinks of anything.       Barriers to Discharge: Continued Medical Work up  Expected Discharge Plan and Services         Living arrangements for the past 2 months: Homeless                                       Social Determinants of Health (SDOH) Interventions    Readmission Risk Interventions Readmission Risk Prevention Plan 10/11/2020  Post Dischage Appt Complete  Medication Screening Complete  Transportation Screening Complete  Some recent data might be hidden

## 2020-10-24 NOTE — Progress Notes (Signed)
PROGRESS NOTE    Christian [name]  JYN:[id number] DOB: 27-Dec-1975 DOA: 10/09/2020 PCP: Pcp, No   Brief Narrative:  45 y.o. male with medical history significant for morbid obesity, hypertension presents to the emergency department via EMS for chief concerns of bilateral lower abdominal pain.   He reports that the pain is in the bilateral lower abdominal region, described as sharp, 5/10 and is now improved to a 4/10. He vomited twice today after eating. He denies fever, chest pain, dysuria. He endorses feeling winded when getting from the ambulance to the ED bed. He could still breath.  He does endorse diarrhea that is brown liquidy stool.  He soiled himself because he states he was feeling so weak from the abdominal pain, he could not get up out of his car.  Initial diagnosis was abdominal wall purulent cellulitis.  Patient with increased work of breathing noted 8/18.  Chest x-ray with free air under diaphragm.  CT with pneumoperitoneum.  Status post diagnostic laparoscopy and repair of the cecal perforation.  Patient is hemodynamically stable however we are lacking safe discharge plan at this time.   Assessment & Plan:   Principal Problem:   Severe sepsis (HCC) Active Problems:   Morbid obesity (HCC)   Hyperglycemia   AKI (acute kidney injury) (HCC)   Pressure ulcer of back   Pressure ulcer of buttock   Essential hypertension   Cellulitis of abdominal wall   Impaired fasting glucose   Acute respiratory failure with hypoxia (HCC)   Weakness   Acute on chronic respiratory failure with hypoxia and hypercapnia (HCC)   Bowel perforation (HCC)   Hypotension   Obesity hypoventilation syndrome (HCC)  Acute on chronic hypoxic and hypercarbic respiratory failure Likely due to undiagnosed obstructive sleep apnea/obesity hypoventilation Mentating clearly On BiPAP at night, nasal cannula during the day Plan: Continue nasal cannula Wean as tolerated Nightly BiPAP Incentive spirometry  and therapy evaluations, exert and ambulate as tolerated Continue Lasix 40 mg daily Continue to wean oxygen   Pneumoperitoneum with cecal perforation Postoperative day #8 Tolerating diet with documented bowel movement Completed course of antibiotics Plan: No further surgical plan.  Surgery signed off.  Severe sepsis secondary to abdominal wall and bilateral lower extremity cellulitis Also presented with AKI, now resolved Completed course of antibiotics Continue with wound care  Morbid obesity With suspected associated sleep apnea This complicates overall care and prognosis  Acute kidney injury Resolved  Weakness Functional decline Therapy recommending rehab but patient is uninsured and we are lacking safe disposition plan.  Patient also lives in his car    DVT prophylaxis: SQ Lovenox Code Status: Full Family Communication: Girlfriend at bedside 8/27 Disposition Plan: Status is: Inpatient  Remains inpatient appropriate because:Inpatient level of care appropriate due to severity of illness  Dispo: The patient is from: Home              Anticipated d/c is to:  TBD              Patient currently is medically stable to d/c.   Difficult to place patient Yes  Patient is essentially medically ready for discharge at this time.  We are currently lacking safe disposition plan.  Patient is on NIPPV at night and dependent nasal cannula during the day.  Patient is uninsured and disposition is quite a challenge.     Level of care: Med-Surg  Consultants:  None  Procedures:  Exploratory laparotomy  Antimicrobials:  None   Subjective: Seen and examined.  Resting in bed.  No visible distress.  Answers questions appropriately  Objective: Vitals:   10/23/20 1622 10/24/20 0145 10/24/20 0515 10/24/20 0801  BP: 124/85  126/82 126/79  Pulse: 89  86 80  Resp: 20  20 20   Temp: 97.6 F (36.4 C)  98.4 F (36.9 C) 99 F (37.2 C)  TempSrc: Oral  Oral Axillary  SpO2: 97% 96%  100% 98%  Weight:      Height:        Intake/Output Summary (Last 24 hours) at 10/24/2020 1203 Last data filed at 10/24/2020 0552 Gross per 24 hour  Intake 720 ml  Output 510 ml  Net 210 ml   Filed Weights   10/09/20 0807 10/15/20 1600  Weight: (!) 197.3 kg (!) 201.6 kg    Examination:  General exam: No acute distress.  Appears chronically ill Respiratory system: Lung sounds decreased at bases.  Bibasilar rhonchi.  Normal work of breathing.  2 L Cardiovascular system: Distant heart sounds, S1-S2, regular rate and rhythm, no murmurs, nonpitting peripheral edema bilateral lower extremities Gastrointestinal system: Abdomen is nondistended, soft and nontender. No organomegaly or masses felt. Normal bowel sounds heard. Central nervous system: Alert and oriented. No focal neurological deficits. Extremities: Symmetric 5 x 5 power. Skin: Bilateral buttock wounds Psychiatry: Judgement and insight appear normal. Mood & affect appropriate.     Data Reviewed: I have personally reviewed following labs and imaging studies  CBC: Recent Labs  Lab 10/19/20 0439 10/23/20 0619  WBC 9.1 9.7  NEUTROABS 6.1  --   HGB 12.9* 12.1*  HCT 42.3 38.5*  MCV 85.5 83.3  PLT 440* 409*   Basic Metabolic Panel: Recent Labs  Lab 10/18/20 0648 10/19/20 0439 10/20/20 0452 10/21/20 0535 10/23/20 0619  NA 141 140 140 137 136  K 4.0 4.4 4.3 4.2 4.0  CL 99 102 98 98 98  CO2 33* 32 32 28 31  GLUCOSE 103* 101* 99 109* 109*  BUN 16 15 15 16 16   CREATININE 0.81 0.89 0.94 0.91 0.88  CALCIUM 8.0* 8.2* 8.4* 8.4* 7.9*  MG 2.3 2.6* 2.4 2.3  --    GFR: Estimated Creatinine Clearance: 194.8 mL/min (by C-G formula based on SCr of 0.88 mg/dL). Liver Function Tests: No results for input(s): AST, ALT, ALKPHOS, BILITOT, PROT, ALBUMIN in the last 168 hours. No results for input(s): LIPASE, AMYLASE in the last 168 hours. No results for input(s): AMMONIA in the last 168 hours. Coagulation Profile: No results  for input(s): INR, PROTIME in the last 168 hours. Cardiac Enzymes: No results for input(s): CKTOTAL, CKMB, CKMBINDEX, TROPONINI in the last 168 hours. BNP (last 3 results) No results for input(s): PROBNP in the last 8760 hours. HbA1C: No results for input(s): HGBA1C in the last 72 hours. CBG: No results for input(s): GLUCAP in the last 168 hours.  Lipid Profile: No results for input(s): CHOL, HDL, LDLCALC, TRIG, CHOLHDL, LDLDIRECT in the last 72 hours. Thyroid Function Tests: No results for input(s): TSH, T4TOTAL, FREET4, T3FREE, THYROIDAB in the last 72 hours. Anemia Panel: No results for input(s): VITAMINB12, FOLATE, FERRITIN, TIBC, IRON, RETICCTPCT in the last 72 hours. Sepsis Labs: No results for input(s): PROCALCITON, LATICACIDVEN in the last 168 hours.  Recent Results (from the past 240 hour(s))  Aerobic/Anaerobic Culture w Gram Stain (surgical/deep wound)     Status: None   Collection Time: 10/15/20 11:36 PM   Specimen: PATH Other; Wound  Result Value Ref Range Status   Specimen Description   Final  PERITONEAL Performed at [facility], [address] [place], [place] [address]    Special Requests   Final    NONE Performed at [facility], [address] [place], [place] [address]    Gram Stain   Final    MODERATE WBC PRESENT, PREDOMINANTLY MONONUCLEAR NO ORGANISMS SEEN    Culture   Final    No growth aerobically or anaerobically. Performed at [facility], [address] [place], [place] [address]    Report Status 10/21/2020 FINAL  Final  MRSA Next Gen by PCR, Nasal     Status: None   Collection Time: 10/16/20  4:50 AM   Specimen: Nasal Mucosa; Nasal Swab  Result Value Ref Range Status   MRSA by PCR Next Gen NOT DETECTED NOT DETECTED Final    Comment: (NOTE) The GeneXpert MRSA Assay (FDA approved for NASAL specimens only), is one component of a comprehensive MRSA colonization surveillance program. It is not intended to diagnose  MRSA infection nor to guide or monitor treatment for MRSA infections. Test performance is not FDA approved in patients less than 71 years old. Performed at [facility], [address], [place], [place] [address]          Radiology Studies: No results found.      Scheduled Meds:  (feeding supplement) PROSource Plus  30 mL Oral TID BM   collagenase   Topical Daily   enoxaparin (LOVENOX) injection  100 mg Subcutaneous Q24H   furosemide  40 mg Oral Daily   multivitamin with minerals  1 tablet Oral Daily   nutrition supplement (JUVEN)  1 packet Oral BID BM   potassium chloride  20 mEq Oral BID   Continuous Infusions:   LOS: 14 days    Time spent: 15 minutes    Tresa Moore, MD [facility] Pager [phone]  If 7PM-7AM, please contact night-coverage 10/24/2020, 12:03 PM

## 2020-10-24 NOTE — Progress Notes (Signed)
Occupational Therapy Treatment Patient Details Name: Christian [name] MRN: [id number] DOB: 08/19/1975 Today's Date: 10/24/2020    History of present illness Pt is a 45 y/o M who presented to ED on 10/09/20 with c/c of bilateral lower abdominal pain & c/o emesis twice that day after eating. Pt found to have abdominal wall purulent cellulitis. Pt admitted with clinical sepsis & AKI. PMH: morbid obesity, HTN. Pt with increased SOB starting 8/18 and CXR ordered with concern for free air. CT showed pneumoperitoneum suspicious for bowel perforation. Pt now s/p diagnostic laparoscopy converted to exploratory laparotomy, primary repair of cecal perforation and t/f to ICU on 8/19.   OT comments  Christian [name] was seen for OT treatment on this date. Upon arrival to room pt seated on BSC. Pt requires CGA + metal bariatric RW for BSC t/f. Pt attempted perihygeine in standing, fair balance c single UE support however 2/2 body habitus unable to complete thoroughly ultimately requiring MAX A to complete. Pt tolerates ~13min standing for perihygiene c SpO2 mid 90s on 2L [place]. Pt making good progress toward goals. Pt continues to benefit from skilled OT services to maximize return to PLOF and minimize risk of future falls, injury, caregiver burden, and readmission. Will continue to follow POC. Discharge recommendation remains appropriate.    Follow Up Recommendations  SNF;Supervision/Assistance - 24 hour    Equipment Recommendations  Other (comment) (defer to next venue of care)    Recommendations for Other Services      Precautions / Restrictions Precautions Precautions: Fall Restrictions Weight Bearing Restrictions: No       Mobility Bed Mobility               General bed mobility comments: received in sitting left in chair    Transfers Overall transfer level: Needs assistance Equipment used: Rolling walker (2 wheeled) Transfers: Sit to/from [employer] Sit to Stand: Min  guard Stand pivot transfers: Min guard            Balance Overall balance assessment: Needs assistance Sitting-balance support: No upper extremity supported;Feet supported Sitting balance-[name]: Fair     Standing balance support: Single extremity supported;During functional activity Standing balance-[name]: Fair                             ADL either performed or assessed with clinical judgement   ADL Overall ADL's : Needs assistance/impaired                                       General ADL Comments: CGA + metal bariatric RW for BSC t/f. Pt attempted perihygeine in standing, fair balance c single UE support however 2/2 body habitus unable to complete thoroughly ultimately requiring MAX A to complete. Pt tolerates ~69min standing for perihygiene c SpO2 mid 90s on 2L [place].      Cognition Arousal/Alertness: Awake/alert Behavior During Therapy: WFL for tasks assessed/performed Overall Cognitive Status: Within Functional Limits for tasks assessed                                          Exercises Exercises: Other exercises Other Exercises Other Exercises: Pt educated re:HEP, adapted toileting strategies Other Exercises: toileting, SPT, sit<>stand  Pertinent Vitals/ Pain       Pain Assessment: Faces Faces Pain Scale: Hurts a little bit Pain Location: posterior leg wounds Pain Descriptors / Indicators: Discomfort;Dull Pain Intervention(s): Limited activity within patient's tolerance;Repositioned   Frequency  Min 2X/week        Progress Toward Goals  OT Goals(current goals can now be found in the care plan section)  Progress towards OT goals: Progressing toward goals  Acute Rehab OT Goals Patient Stated Goal: to get stronger OT Goal Formulation: With patient Time For Goal Achievement: 10/31/20 Potential to Achieve Goals: Good ADL Goals Pt Will Perform Upper Body Dressing: with  supervision;sitting Pt Will Perform Lower Body Dressing: with mod assist;sit to/from stand;with adaptive equipment Pt Will Transfer to Toilet: with mod assist;with +2 assist;squat pivot transfer;bedside commode Pt/caregiver will Perform Home Exercise Program: Both right and left upper extremity;With theraband;With written HEP provided;Independently;Increased strength  Plan Discharge plan remains appropriate;Frequency remains appropriate    Co-evaluation                 AM-PAC OT "6 Clicks" Daily Activity     Outcome Measure   Help from another person eating meals?: None Help from another person taking care of personal grooming?: None Help from another person toileting, which includes using toliet, bedpan, or urinal?: A Lot Help from another person bathing (including washing, rinsing, drying)?: A Lot Help from another person to put on and taking off regular upper body clothing?: A Lot Help from another person to put on and taking off regular lower body clothing?: A Lot 6 Click Score: 16    End of Session Equipment Utilized During Treatment: Oxygen;Rolling walker  OT Visit Diagnosis: Unsteadiness on feet (R26.81);Muscle weakness (generalized) (M62.81)   Activity Tolerance Patient tolerated treatment well   Patient Left in chair;with call bell/phone within reach;with nursing/sitter in room   Nurse Communication Mobility status        Time: 1191-4782 OT Time Calculation (min): 14 min  Charges: OT General Charges $OT Visit: 1 Visit OT Treatments $Self Care/Home Management : 8-22 mins  Kathie Dike, M.S. OTR/L  10/24/20, 2:40 PM  ascom [phone]

## 2020-10-25 NOTE — Progress Notes (Signed)
PROGRESS NOTE    Christian [name]  KWI:[id number] DOB: 1975-04-14 DOA: 10/09/2020 PCP: Pcp, No   Brief Narrative:  45 y.o. male with medical history significant for morbid obesity, hypertension presents to the emergency department via EMS for chief concerns of bilateral lower abdominal pain.   He reports that the pain is in the bilateral lower abdominal region, described as sharp, 5/10 and is now improved to a 4/10. He vomited twice today after eating. He denies fever, chest pain, dysuria. He endorses feeling winded when getting from the ambulance to the ED bed. He could still breath.  He does endorse diarrhea that is brown liquidy stool.  He soiled himself because he states he was feeling so weak from the abdominal pain, he could not get up out of his car.  Initial diagnosis was abdominal wall purulent cellulitis.  Patient with increased work of breathing noted 8/18.  Chest x-ray with free air under diaphragm.  CT with pneumoperitoneum.  Status post diagnostic laparoscopy and repair of the cecal perforation.  Patient is hemodynamically stable however we are lacking safe discharge plan at this time.  Patient's friend discussed with CSW.  He is unable to let patient stay with him.  Patient's car is apparently filled with trash, roaches, bedbugs and is likely not safe for patient to return to.   Assessment & Plan:   Principal Problem:   Severe sepsis (HCC) Active Problems:   Morbid obesity (HCC)   Hyperglycemia   AKI (acute kidney injury) (HCC)   Pressure ulcer of back   Pressure ulcer of buttock   Essential hypertension   Cellulitis of abdominal wall   Impaired fasting glucose   Acute respiratory failure with hypoxia (HCC)   Weakness   Acute on chronic respiratory failure with hypoxia and hypercapnia (HCC)   Bowel perforation (HCC)   Hypotension   Obesity hypoventilation syndrome (HCC)  Acute on chronic hypoxic and hypercarbic respiratory failure Likely due to undiagnosed  obstructive sleep apnea/obesity hypoventilation Mentating clearly On BiPAP at night, nasal cannula during the day Plan: Continue nasal cannula Wean as tolerated Nightly BiPAP Incentive spirometry and therapy evaluations, exert and ambulate as tolerated Continue Lasix 40 mg daily Continue to wean oxygen as tolerated   Pneumoperitoneum with cecal perforation Postoperative day 9 Tolerating diet with documented bowel movement Completed course of antibiotics Plan: No further surgical plan.  Surgery signed off.  Severe sepsis secondary to abdominal wall and bilateral lower extremity cellulitis Also presented with AKI, now resolved Completed course of antibiotics Continue with wound care  Morbid obesity With suspected associated sleep apnea This complicates overall care and prognosis  Acute kidney injury Resolved  Weakness Functional decline Therapy recommending rehab but patient is uninsured and we are lacking safe disposition plan.  Patient also lives in his car    DVT prophylaxis: SQ Lovenox Code Status: Full Family Communication: Girlfriend at bedside 8/27 Disposition Plan: Status is: Inpatient  Remains inpatient appropriate because:Inpatient level of care appropriate due to severity of illness  Dispo: The patient is from: Home              Anticipated d/c is to:  TBD              Patient currently is medically stable to d/c.   Difficult to place patient Yes  Patient is essentially medically ready for discharge at this time.  We are currently lacking safe disposition plan.  Patient is on NIPPV at night and dependent nasal cannula during the  day.  Patient is uninsured and disposition is quite a challenge.  He is also homeless and lives in his car.  Apparently the car is not safe to return to even.       Level of care: Med-Surg  Consultants:  None  Procedures:  Exploratory laparotomy  Antimicrobials:  None   Subjective: Seen and examined.  Resting in bed.   No visible distress.  Answers questions appropriately  Objective: Vitals:   10/24/20 2215 10/24/20 2355 10/25/20 0506 10/25/20 1022  BP: 130/89  (!) 135/92 109/65  Pulse: 91  79 86  Resp: 20  (!) 22 (!) 24  Temp: 98 F (36.7 C)  97.8 F (36.6 C) 98 F (36.7 C)  TempSrc: Oral  Oral Oral  SpO2: 97% 95% 100% 96%  Weight:      Height:        Intake/Output Summary (Last 24 hours) at 10/25/2020 1246 Last data filed at 10/25/2020 0546 Gross per 24 hour  Intake 480 ml  Output 410 ml  Net 70 ml   Filed Weights   10/09/20 0807 10/15/20 1600  Weight: (!) 197.3 kg (!) 201.6 kg    Examination:  General exam: No acute distress.  Appears chronically ill Respiratory system: Lung sounds decreased at bases.  Bibasilar rhonchi.  Normal work of breathing.  2 L Cardiovascular system: Distant heart sounds, S1-S2, regular rate and rhythm, no murmurs, nonpitting peripheral edema bilateral lower extremities Gastrointestinal system: Abdomen is nondistended, soft and nontender. No organomegaly or masses felt. Normal bowel sounds heard. Central nervous system: Alert and oriented. No focal neurological deficits. Extremities: Symmetric 5 x 5 power. Skin: Bilateral buttock wounds Psychiatry: Judgement and insight appear normal. Mood & affect appropriate.     Data Reviewed: I have personally reviewed following labs and imaging studies  CBC: Recent Labs  Lab 10/19/20 0439 10/23/20 0619  WBC 9.1 9.7  NEUTROABS 6.1  --   HGB 12.9* 12.1*  HCT 42.3 38.5*  MCV 85.5 83.3  PLT 440* 409*   Basic Metabolic Panel: Recent Labs  Lab 10/19/20 0439 10/20/20 0452 10/21/20 0535 10/23/20 0619  NA 140 140 137 136  K 4.4 4.3 4.2 4.0  CL 102 98 98 98  CO2 32 32 28 31  GLUCOSE 101* 99 109* 109*  BUN 15 15 16 16   CREATININE 0.89 0.94 0.91 0.88  CALCIUM 8.2* 8.4* 8.4* 7.9*  MG 2.6* 2.4 2.3  --    GFR: Estimated Creatinine Clearance: 194.8 mL/min (by C-G formula based on SCr of 0.88 mg/dL). Liver  Function Tests: No results for input(s): AST, ALT, ALKPHOS, BILITOT, PROT, ALBUMIN in the last 168 hours. No results for input(s): LIPASE, AMYLASE in the last 168 hours. No results for input(s): AMMONIA in the last 168 hours. Coagulation Profile: No results for input(s): INR, PROTIME in the last 168 hours. Cardiac Enzymes: No results for input(s): CKTOTAL, CKMB, CKMBINDEX, TROPONINI in the last 168 hours. BNP (last 3 results) No results for input(s): PROBNP in the last 8760 hours. HbA1C: No results for input(s): HGBA1C in the last 72 hours. CBG: No results for input(s): GLUCAP in the last 168 hours.  Lipid Profile: No results for input(s): CHOL, HDL, LDLCALC, TRIG, CHOLHDL, LDLDIRECT in the last 72 hours. Thyroid Function Tests: No results for input(s): TSH, T4TOTAL, FREET4, T3FREE, THYROIDAB in the last 72 hours. Anemia Panel: No results for input(s): VITAMINB12, FOLATE, FERRITIN, TIBC, IRON, RETICCTPCT in the last 72 hours. Sepsis Labs: No results for input(s): PROCALCITON, LATICACIDVEN  in the last 168 hours.  Recent Results (from the past 240 hour(s))  Aerobic/Anaerobic Culture w Gram Stain (surgical/deep wound)     Status: None   Collection Time: 10/15/20 11:36 PM   Specimen: PATH Other; Wound  Result Value Ref Range Status   Specimen Description   Final    PERITONEAL Performed at [facility], [address] [place], [place] [address]    Special Requests   Final    NONE Performed at [facility], [address] [place], [place] [address]    Gram Stain   Final    MODERATE WBC PRESENT, PREDOMINANTLY MONONUCLEAR NO ORGANISMS SEEN    Culture   Final    No growth aerobically or anaerobically. Performed at [facility], [address] [place], [place] [address]    Report Status 10/21/2020 FINAL  Final  MRSA Next Gen by PCR, Nasal     Status: None   Collection Time: 10/16/20  4:50 AM   Specimen: Nasal Mucosa; Nasal Swab  Result Value Ref  Range Status   MRSA by PCR Next Gen NOT DETECTED NOT DETECTED Final    Comment: (NOTE) The GeneXpert MRSA Assay (FDA approved for NASAL specimens only), is one component of a comprehensive MRSA colonization surveillance program. It is not intended to diagnose MRSA infection nor to guide or monitor treatment for MRSA infections. Test performance is not FDA approved in patients less than 42 years old. Performed at [facility], [address] [place], [place] [address]          Radiology Studies: No results found.      Scheduled Meds:  (feeding supplement) PROSource Plus  30 mL Oral TID BM   collagenase   Topical Daily   enoxaparin (LOVENOX) injection  100 mg Subcutaneous Q24H   furosemide  40 mg Oral Daily   multivitamin with minerals  1 tablet Oral Daily   nutrition supplement (JUVEN)  1 packet Oral BID BM   potassium chloride  20 mEq Oral BID   Continuous Infusions:   LOS: 15 days    Time spent: 15 minutes    Tresa Moore, MD [facility] Pager [phone]  If 7PM-7AM, please contact night-coverage 10/25/2020, 12:46 PM

## 2020-10-26 NOTE — Progress Notes (Signed)
PROGRESS NOTE    Christian [name]  QBH:[id number] DOB: [birth year] DOA: 10/09/2020 PCP: Pcp, No   Brief Narrative:  45 y.o. male with medical history significant for morbid obesity, hypertension presents to the emergency department via EMS for chief concerns of bilateral lower abdominal pain.   He reports that the pain is in the bilateral lower abdominal region, described as sharp, 5/10 and is now improved to a 4/10. He vomited twice today after eating. He denies fever, chest pain, dysuria. He endorses feeling winded when getting from the ambulance to the ED bed. He could still breath.  He does endorse diarrhea that is brown liquidy stool.  He soiled himself because he states he was feeling so weak from the abdominal pain, he could not get up out of his car.  Initial diagnosis was abdominal wall purulent cellulitis.  Patient with increased work of breathing noted 8/18.  Chest x-ray with free air under diaphragm.  CT with pneumoperitoneum.  Status post diagnostic laparoscopy and repair of the cecal perforation.  Patient is hemodynamically stable however we are lacking safe discharge plan at this time.  Patient's friend discussed with CSW.  He is unable to let patient stay with him.  Patient's car is apparently filled with trash, roaches, bedbugs and is likely not safe for patient to return to.  No clear safe disposition plan   Assessment & Plan:   Principal Problem:   Severe sepsis (HCC) Active Problems:   Morbid obesity (HCC)   Hyperglycemia   AKI (acute kidney injury) (HCC)   Pressure ulcer of back   Pressure ulcer of buttock   Essential hypertension   Cellulitis of abdominal wall   Impaired fasting glucose   Acute respiratory failure with hypoxia (HCC)   Weakness   Acute on chronic respiratory failure with hypoxia and hypercapnia (HCC)   Bowel perforation (HCC)   Hypotension   Obesity hypoventilation syndrome (HCC)  Acute on chronic hypoxic and hypercarbic respiratory  failure Likely due to undiagnosed obstructive sleep apnea/obesity hypoventilation Mentating clearly On BiPAP at night, nasal cannula during the day Plan: Continue nasal cannula Wean as tolerated Nightly BiPAP Incentive spirometry and therapy evaluations, exert and ambulate as tolerated Continue Lasix 40 mg daily Continue to wean oxygen as tolerated Patient who is dependent on 4 L, can get down to 2-3 times   Pneumoperitoneum with cecal perforation Postoperative day 10 Tolerating diet with documented bowel movement Completed course of antibiotics Plan: No further surgical plan.  Surgery signed off.  Severe sepsis secondary to abdominal wall and bilateral lower extremity cellulitis Also presented with AKI, now resolved Completed course of antibiotics Continue with wound care  Morbid obesity With suspected associated sleep apnea This complicates overall care and prognosis  Acute kidney injury Resolved  Weakness Functional decline Therapy recommending rehab but patient is uninsured and we are lacking safe disposition plan.  Patient also lives in his car    DVT prophylaxis: SQ Lovenox Code Status: Full Family Communication: Girlfriend at bedside 8/27 Disposition Plan: Status is: Inpatient  Remains inpatient appropriate because:Inpatient level of care appropriate due to severity of illness  Dispo: The patient is from: Home              Anticipated d/c is to:  TBD              Patient currently is medically stable to d/c.   Difficult to place patient Yes   Patient is essentially medically ready for discharge at this time.  We are currently lacking safe disposition plan.  Patient is on NIPPV at night and dependent nasal cannula during the day.  Patient is uninsured and disposition is quite a challenge.  He is also homeless and lives in his car.  Apparently the car is not safe to return to even.     Level of care: Med-Surg  Consultants:  None  Procedures:   Exploratory laparotomy  Antimicrobials:  None   Subjective: Seen and examined.  Resting in bed.  No visible distress.  Answers questions appropriately  Objective: Vitals:   10/25/20 1533 10/25/20 2004 10/26/20 0501 10/26/20 0955  BP: 133/86 125/72 127/78 108/69  Pulse: 90 85 79 80  Resp: 16  18 20   Temp: 98.2 F (36.8 C) 98 F (36.7 C) 98 F (36.7 C) 98.5 F (36.9 C)  TempSrc: Oral  Oral   SpO2: 95% 95% 100% 97%  Weight:      Height:        Intake/Output Summary (Last 24 hours) at 10/26/2020 1247 Last data filed at 10/25/2020 1930 Gross per 24 hour  Intake --  Output 405 ml  Net -405 ml   Filed Weights   10/09/20 0807 10/15/20 1600  Weight: (!) 197.3 kg (!) 201.6 kg    Examination:  General exam: No acute distress.  Appears chronically ill Respiratory system: Lung sounds decreased at bases.  Bibasilar rhonchi.  Normal work of breathing.  2 L Cardiovascular system: Distant heart sounds, S1-S2, regular rate and rhythm, no murmurs, nonpitting peripheral edema bilateral lower extremities Gastrointestinal system: Abdomen is nondistended, soft and nontender. No organomegaly or masses felt. Normal bowel sounds heard. Central nervous system: Alert and oriented. No focal neurological deficits. Extremities: Symmetric 5 x 5 power. Skin: Bilateral buttock wounds Psychiatry: Judgement and insight appear normal. Mood & affect appropriate.     Data Reviewed: I have personally reviewed following labs and imaging studies  CBC: Recent Labs  Lab 10/23/20 0619  WBC 9.7  HGB 12.1*  HCT 38.5*  MCV 83.3  PLT 409*   Basic Metabolic Panel: Recent Labs  Lab 10/20/20 0452 10/21/20 0535 10/23/20 0619  NA 140 137 136  K 4.3 4.2 4.0  CL 98 98 98  CO2 32 28 31  GLUCOSE 99 109* 109*  BUN 15 16 16   CREATININE 0.94 0.91 0.88  CALCIUM 8.4* 8.4* 7.9*  MG 2.4 2.3  --    GFR: Estimated Creatinine Clearance: 194.8 mL/min (by C-G formula based on SCr of 0.88 mg/dL). Liver  Function Tests: No results for input(s): AST, ALT, ALKPHOS, BILITOT, PROT, ALBUMIN in the last 168 hours. No results for input(s): LIPASE, AMYLASE in the last 168 hours. No results for input(s): AMMONIA in the last 168 hours. Coagulation Profile: No results for input(s): INR, PROTIME in the last 168 hours. Cardiac Enzymes: No results for input(s): CKTOTAL, CKMB, CKMBINDEX, TROPONINI in the last 168 hours. BNP (last 3 results) No results for input(s): PROBNP in the last 8760 hours. HbA1C: No results for input(s): HGBA1C in the last 72 hours. CBG: No results for input(s): GLUCAP in the last 168 hours.  Lipid Profile: No results for input(s): CHOL, HDL, LDLCALC, TRIG, CHOLHDL, LDLDIRECT in the last 72 hours. Thyroid Function Tests: No results for input(s): TSH, T4TOTAL, FREET4, T3FREE, THYROIDAB in the last 72 hours. Anemia Panel: No results for input(s): VITAMINB12, FOLATE, FERRITIN, TIBC, IRON, RETICCTPCT in the last 72 hours. Sepsis Labs: No results for input(s): PROCALCITON, LATICACIDVEN in the last 168 hours.  No  results found for this or any previous visit (from the past 240 hour(s)).        Radiology Studies: No results found.      Scheduled Meds:  (feeding supplement) PROSource Plus  30 mL Oral TID BM   collagenase   Topical Daily   enoxaparin (LOVENOX) injection  100 mg Subcutaneous Q24H   furosemide  40 mg Oral Daily   multivitamin with minerals  1 tablet Oral Daily   nutrition supplement (JUVEN)  1 packet Oral BID BM   potassium chloride  20 mEq Oral BID   Continuous Infusions:   LOS: 16 days    Time spent: 15 minutes    Tresa Moore, MD [facility] Pager [phone]  If 7PM-7AM, please contact night-coverage 10/26/2020, 12:47 PM

## 2020-10-26 NOTE — Progress Notes (Signed)
Occupational Therapy Treatment Patient Details Name: Christian [name] MRN: [id number] DOB: 11/26/1975 Today's Date: 10/26/2020    History of present illness Pt is a 45 y/o M who presented to ED on 10/09/20 with c/c of bilateral lower abdominal pain & c/o emesis twice that day after eating. Pt found to have abdominal wall purulent cellulitis. Pt admitted with clinical sepsis & AKI. PMH: morbid obesity, HTN. Pt with increased SOB starting 8/18 and CXR ordered with concern for free air. CT showed pneumoperitoneum suspicious for bowel perforation. Pt now s/p diagnostic laparoscopy converted to exploratory laparotomy, primary repair of cecal perforation and t/f to ICU on 8/19.   OT comments  Reviewed pt's daily routines with pt., and energy/conservation/work simplification. Pt. Education was provided about general A/E for LE ADLs. Pt. Tolerated yellow light resistive ex. for bilateral elbow flexion, and extension for 1 set 10 reps each. Pt.'s SO2 94%, HR 94 bpms on 2L02. Pt. Continues to benefit from OT services for ADL training, A/E training, and pt. education about energy conservation/work simplification. Pt. would benefit from SNF level of care upon discharge, with follow-up OT services.    Follow Up Recommendations  SNF;Supervision/Assistance - 24 hour    Equipment Recommendations  Other (comment)    Recommendations for Other Services      Precautions / Restrictions Precautions Precautions: Fall       Mobility Bed Mobility               General bed mobility comments: Pt. up in chair upon arrival    Transfers                 General transfer comment: deferred    Balance                                           ADL either performed or assessed with clinical judgement   ADL   Eating/Feeding: Independent   Grooming: Minimal assistance           Upper Body Dressing : Minimal assistance;Sitting   Lower Body Dressing: Total assistance                        Vision       Perception     Praxis      Cognition Arousal/Alertness: Awake/alert Behavior During Therapy: WFL for tasks assessed/performed Overall Cognitive Status: Within Functional Limits for tasks assessed                                          Exercises     Shoulder Instructions       General Comments      Pertinent Vitals/ Pain       Pain Assessment: No/denies pain  Home Living                                          Prior Functioning/Environment              Frequency  Min 2X/week        Progress Toward Goals  OT Goals(current goals can now be found in the care plan section)  Progress towards OT  goals: Progressing toward goals  Acute Rehab OT Goals Patient Stated Goal: to get stronger OT Goal Formulation: With patient Time For Goal Achievement: 10/31/20 Potential to Achieve Goals: Good  Plan Discharge plan remains appropriate;Frequency remains appropriate    Co-evaluation                 AM-PAC OT "6 Clicks" Daily Activity     Outcome Measure   Help from another person eating meals?: None Help from another person taking care of personal grooming?: A Little Help from another person toileting, which includes using toliet, bedpan, or urinal?: A Lot Help from another person bathing (including washing, rinsing, drying)?: A Lot Help from another person to put on and taking off regular upper body clothing?: A Lot Help from another person to put on and taking off regular lower body clothing?: A Lot 6 Click Score: 15    End of Session Equipment Utilized During Treatment: Oxygen;Rolling walker  OT Visit Diagnosis: Unsteadiness on feet (R26.81);Muscle weakness (generalized) (M62.81)   Activity Tolerance     Patient Left in chair;with call bell/phone within reach;with nursing/sitter in room   Nurse Communication Mobility status        Time: 1635-1710 OT Time  Calculation (min): 35 min  Charges: OT General Charges $OT Visit: 1 Visit OT Treatments $Self Care/Home Management : 23-37 mins  Olegario Messier, MS, OTR/L    Olegario Messier 10/26/2020, 5:20 PM

## 2020-10-26 NOTE — Progress Notes (Signed)
PT Cancellation Note  Patient Details Name: Christian [name] MRN: [id number] DOB: 1975/12/21   Cancelled Treatment:    Reason Eval/Treat Not Completed: Other (comment) Pt on BIPAP. PT to reassess as able  [employer], SPT

## 2020-10-27 NOTE — TOC Progression Note (Signed)
Transition of Care [facility]) - Progression Note    Patient Details  Name: Christian [name] MRN: [id number] Date of Birth: 05/31/1975  [facility]) CM/SW Contact  Chapman Fitch, RN Phone Number: 10/27/2020, 1:06 PM  Clinical Narrative:     Confirmed with Tommie Ard with [employer] that patient is not eligible for either [place] Medicaid program at this time    Barriers to Discharge: Continued Medical Work up  Expected Discharge Plan and Services         Living arrangements for the past 2 months: Homeless                                       Social Determinants of Health (SDOH) Interventions    Readmission Risk Interventions Readmission Risk Prevention Plan 10/11/2020  Post Dischage Appt Complete  Medication Screening Complete  Transportation Screening Complete  Some recent data might be hidden

## 2020-10-27 NOTE — Progress Notes (Signed)
PT Cancellation Note  Patient Details Name: Christian [name] MRN: [id number] DOB: 1975-06-12   Cancelled Treatment:     PT attempt. Pt is sitting in recliner eating lunch. Will return at 3pm per pt request.   Rushie Chestnut 10/27/2020, 1:13 PM

## 2020-10-27 NOTE — Progress Notes (Signed)
PROGRESS NOTE    Christian [name]  QIO:[id number] DOB: 07/30/1975 DOA: 10/09/2020 PCP: Pcp, No   Brief Narrative:  45 y.o. male with medical history significant for morbid obesity, hypertension presents to the emergency department via EMS for chief concerns of bilateral lower abdominal pain.   He reports that the pain is in the bilateral lower abdominal region, described as sharp, 5/10 and is now improved to a 4/10. He vomited twice today after eating. He denies fever, chest pain, dysuria. He endorses feeling winded when getting from the ambulance to the ED bed. He could still breath.  He does endorse diarrhea that is brown liquidy stool.  He soiled himself because he states he was feeling so weak from the abdominal pain, he could not get up out of his car.  Initial diagnosis was abdominal wall purulent cellulitis.  Patient with increased work of breathing noted 8/18.  Chest x-ray with free air under diaphragm.  CT with pneumoperitoneum.  Status post diagnostic laparoscopy and repair of the cecal perforation.  Patient is hemodynamically stable however we are lacking safe discharge plan at this time.  Patient's friend discussed with CSW.  He is unable to let patient stay with him.  Patient's car is apparently filled with trash, roaches, bedbugs and is likely not safe for patient to return to.  No clear safe disposition plan   Assessment & Plan:   Principal Problem:   Severe sepsis (HCC) Active Problems:   Morbid obesity (HCC)   Hyperglycemia   AKI (acute kidney injury) (HCC)   Pressure ulcer of back   Pressure ulcer of buttock   Essential hypertension   Cellulitis of abdominal wall   Impaired fasting glucose   Acute respiratory failure with hypoxia (HCC)   Weakness   Acute on chronic respiratory failure with hypoxia and hypercapnia (HCC)   Bowel perforation (HCC)   Hypotension   Obesity hypoventilation syndrome (HCC)  Acute on chronic hypoxic and hypercarbic respiratory  failure Likely due to undiagnosed obstructive sleep apnea/obesity hypoventilation Mentating clearly On BiPAP at night, nasal cannula during the day Plan: Continue nasal cannula Wean as tolerated Nightly BiPAP Incentive spirometry and therapy evaluations, exert and ambulate as tolerated Continue Lasix 40 mg daily Continue to wean oxygen as tolerated Disposition major problem.  Patient on nasal cannula during the day and BiPAP at night.   Pneumoperitoneum with cecal perforation Postoperative day 11 Tolerating diet with documented bowel movement Completed course of antibiotics Plan: No further surgical plan.  Surgery signed off.  Severe sepsis secondary to abdominal wall and bilateral lower extremity cellulitis Also presented with AKI, now resolved Completed course of antibiotics Continue with wound care  Morbid obesity With suspected associated sleep apnea This complicates overall care and prognosis  Acute kidney injury Resolved  Weakness Functional decline Therapy recommending rehab but patient is uninsured and we are lacking safe disposition plan.  Patient also lives in his car    DVT prophylaxis: SQ Lovenox Code Status: Full Family Communication: Girlfriend at bedside 8/27 Disposition Plan: Status is: Inpatient  Remains inpatient appropriate because:Inpatient level of care appropriate due to severity of illness  Dispo: The patient is from: Home              Anticipated d/c is to:  TBD              Patient currently is medically stable to d/c.   Difficult to place patient Yes   Patient is essentially medically ready for discharge at this  time.  We are currently lacking safe disposition plan.  Patient is on NIPPV at night and dependent nasal cannula during the day.  Patient is uninsured and disposition is quite a challenge.  He is also homeless and lives in his car.  Apparently the car is not safe to return to.   Level of care: Med-Surg  Consultants:   None  Procedures:  Exploratory laparotomy  Antimicrobials:  None   Subjective: Seen and examined.  Resting in bed.  No visible distress.  Answers questions appropriately  Objective: Vitals:   10/26/20 1826 10/26/20 2026 10/27/20 0529 10/27/20 0806  BP: 117/71 125/68 115/68 119/61  Pulse: 95 89 76 72  Resp: 20 18 18 16   Temp: 98.4 F (36.9 C) 98.3 F (36.8 C) 97.6 F (36.4 C) 97.9 F (36.6 C)  TempSrc: Oral Oral Oral Axillary  SpO2: 96% 97% 100% 100%  Weight:      Height:        Intake/Output Summary (Last 24 hours) at 10/27/2020 1207 Last data filed at 10/26/2020 1912 Gross per 24 hour  Intake 960 ml  Output 10 ml  Net 950 ml   Filed Weights   10/09/20 0807 10/15/20 1600  Weight: (!) 197.3 kg (!) 201.6 kg    Examination:  General exam: No acute distress.  Appears chronically ill Respiratory system: Lung sounds decreased at bases.  Bibasilar rhonchi.  Normal work of breathing.  2 L Cardiovascular system: Distant heart sounds, S1-S2, regular rate and rhythm, no murmurs, nonpitting peripheral edema bilateral lower extremities Gastrointestinal system: Abdomen is nondistended, soft and nontender. No organomegaly or masses felt. Normal bowel sounds heard. Central nervous system: Alert and oriented. No focal neurological deficits. Extremities: Symmetric 5 x 5 power. Skin: Bilateral buttock wounds Psychiatry: Judgement and insight appear normal. Mood & affect appropriate.     Data Reviewed: I have personally reviewed following labs and imaging studies  CBC: Recent Labs  Lab 10/23/20 0619  WBC 9.7  HGB 12.1*  HCT 38.5*  MCV 83.3  PLT 409*   Basic Metabolic Panel: Recent Labs  Lab 10/21/20 0535 10/23/20 0619  NA 137 136  K 4.2 4.0  CL 98 98  CO2 28 31  GLUCOSE 109* 109*  BUN 16 16  CREATININE 0.91 0.88  CALCIUM 8.4* 7.9*  MG 2.3  --    GFR: Estimated Creatinine Clearance: 194.8 mL/min (by C-G formula based on SCr of 0.88 mg/dL). Liver Function  Tests: No results for input(s): AST, ALT, ALKPHOS, BILITOT, PROT, ALBUMIN in the last 168 hours. No results for input(s): LIPASE, AMYLASE in the last 168 hours. No results for input(s): AMMONIA in the last 168 hours. Coagulation Profile: No results for input(s): INR, PROTIME in the last 168 hours. Cardiac Enzymes: No results for input(s): CKTOTAL, CKMB, CKMBINDEX, TROPONINI in the last 168 hours. BNP (last 3 results) No results for input(s): PROBNP in the last 8760 hours. HbA1C: No results for input(s): HGBA1C in the last 72 hours. CBG: No results for input(s): GLUCAP in the last 168 hours.  Lipid Profile: No results for input(s): CHOL, HDL, LDLCALC, TRIG, CHOLHDL, LDLDIRECT in the last 72 hours. Thyroid Function Tests: No results for input(s): TSH, T4TOTAL, FREET4, T3FREE, THYROIDAB in the last 72 hours. Anemia Panel: No results for input(s): VITAMINB12, FOLATE, FERRITIN, TIBC, IRON, RETICCTPCT in the last 72 hours. Sepsis Labs: No results for input(s): PROCALCITON, LATICACIDVEN in the last 168 hours.  No results found for this or any previous visit (from the past  240 hour(s)).        Radiology Studies: No results found.      Scheduled Meds:  (feeding supplement) PROSource Plus  30 mL Oral TID BM   collagenase   Topical Daily   enoxaparin (LOVENOX) injection  100 mg Subcutaneous Q24H   furosemide  40 mg Oral Daily   multivitamin with minerals  1 tablet Oral Daily   nutrition supplement (JUVEN)  1 packet Oral BID BM   potassium chloride  20 mEq Oral BID   Continuous Infusions:   LOS: 17 days    Time spent: 15 minutes    Tresa Moore, MD [facility] Pager [phone]  If 7PM-7AM, please contact night-coverage 10/27/2020, 12:07 PM

## 2020-10-27 NOTE — TOC Progression Note (Signed)
Transition of Care [facility]) - Progression Note    Patient Details  Name: Christian [name] MRN: [id number] Date of Birth: 1975-03-24  [facility]) CM/SW Contact  Chapman Fitch, RN Phone Number: 10/27/2020, 9:32 AM  Clinical Narrative:     Patient continues to require 4L o2 during the day Bipap at night No clear discharge disposition at this time  Message sent to Tommie Ard with [employer] to follow up about medicaid process     Barriers to Discharge: Continued Medical Work up  Expected Discharge Plan and Services         Living arrangements for the past 2 months: Homeless                                       Social Determinants of Health (SDOH) Interventions    Readmission Risk Interventions Readmission Risk Prevention Plan 10/11/2020  Post Dischage Appt Complete  Medication Screening Complete  Transportation Screening Complete  Some recent data might be hidden

## 2020-10-27 NOTE — Progress Notes (Signed)
Physical Therapy Treatment Patient Details Name: Christian [name] MRN: [id number] DOB: 1975-06-10 Today's Date: 10/27/2020    History of Present Illness Pt is a 45 y/o M who presented to ED on 10/09/20 with c/c of bilateral lower abdominal pain & c/o emesis twice that day after eating. Pt found to have abdominal wall purulent cellulitis. Pt admitted with clinical sepsis & AKI. PMH: morbid obesity, HTN. Pt with increased SOB starting 8/18 and CXR ordered with concern for free air. CT showed pneumoperitoneum suspicious for bowel perforation. Pt now s/p diagnostic laparoscopy converted to exploratory laparotomy, primary repair of cecal perforation and t/f to ICU on 8/19.    PT Comments    Pt was sitting in recliner with chaplain at bedside upon arriving. He agrees to session and is cooperative and pleasant throughout. Does endorse slight low back pain in standing however rated it " tolerable." He was on 3 L o2 but during ambulation tolerated 2 L o2 without desaturation. Sao2 > 92% throughout. He ambulated 3 x ~ 20 ft with bariatric RW + chair follow for safety. Prolong seated rest between trials. HR elevated to 110 at peak during ambulation. Overall pt did well. Will continue acute PT to progress strength and activity tolerance.    Follow Up Recommendations  SNF;Supervision/Assistance - 24 hour     Equipment Recommendations  Other (comment) (Bariatric RW)       Precautions / Restrictions Precautions Precautions: Fall Restrictions Weight Bearing Restrictions: No    Mobility  Bed Mobility  General bed mobility comments: Pt was in recliner pre/post session    Transfers Overall transfer level: Needs assistance Equipment used: Rolling walker (2 wheeled) [employer]vertising account executive specialty 750 lbs capacity) Transfers: Sit to/from Stand Sit to Stand: Min guard         General transfer comment: Pt is able to STS with CGA for safety only. Poor eccentric controll with stand to  sit  Ambulation/Gait Ambulation/Gait assistance: Min guard Gait Distance (Feet): 20 Feet Assistive device: Rolling walker (2 wheeled) (bariatric RW/750 lb wt capacity) Gait Pattern/deviations: Step-through pattern Gait velocity: decreased   General Gait Details: trunk flexed. pt ambulated 3 x 20 ft with prolonged rest between trials    Balance Overall balance assessment: Needs assistance Sitting-balance support: No upper extremity supported;Feet supported Sitting balance-[name]: Good Sitting balance - Comments: no LOB with sitting balance/ reaching inside BOS/outside BOS   Standing balance support: Bilateral upper extremity supported;During functional activity Standing balance-[name]: Fair Standing balance comment: pt has back pain in standing so preferred slightly flexed posture during gait. no LOB but very limited by fatigue      Cognition Arousal/Alertness: Awake/alert Behavior During Therapy: WFL for tasks assessed/performed Overall Cognitive Status: Within Functional Limits for tasks assessed      General Comments: Pt is alert and oriented x 3 however disoriented to situation.             Pertinent Vitals/Pain Pain Assessment: No/denies pain Pain Score: 0-No pain Faces Pain Scale: No hurt Pain Location: posterior leg wounds Pain Descriptors / Indicators: Discomfort;Dull Pain Intervention(s): Monitored during session;Limited activity within patient's tolerance     PT Goals (current goals can now be found in the care plan section) Acute Rehab PT Goals Patient Stated Goal: to get stronger Progress towards PT goals: Progressing toward goals    Frequency    Min 2X/week      PT Plan Current plan remains appropriate    Co-evaluation     PT goals addressed during  session: Mobility/safety with mobility;Balance;Proper use of DME;Strengthening/ROM        AM-PAC PT "6 Clicks" Mobility   Outcome Measure  Help needed turning from your back to your  side while in a flat bed without using bedrails?: A Lot Help needed moving from lying on your back to sitting on the side of a flat bed without using bedrails?: A Lot Help needed moving to and from a bed to a chair (including a wheelchair)?: A Little Help needed standing up from a chair using your arms (e.g., wheelchair or bedside chair)?: A Little Help needed to walk in hospital room?: A Little Help needed climbing 3-5 steps with a railing? : A Lot 6 Click Score: 15    End of Session Equipment Utilized During Treatment: Oxygen (wean to 2 L o2 during gait training qwith sao2 > 93%) Activity Tolerance: Patient tolerated treatment well;Patient limited by fatigue;Other (comment) (c/o LBP during ambulation) Patient left: in chair;with call bell/phone within reach;with nursing/sitter in room Nurse Communication: Mobility status PT Visit Diagnosis: Muscle weakness (generalized) (M62.81);Difficulty in walking, not elsewhere classified (R26.2)     Time: 1455-1520 PT Time Calculation (min) (ACUTE ONLY): 25 min  Charges:  $Gait Training: 8-22 mins $Therapeutic Activity: 8-22 mins                     Jetta Lout PTA 10/27/20, 3:49 PM

## 2020-10-28 LAB — IRON AND TIBC
Iron: 25 ug/dL — ABNORMAL LOW (ref 45–182)
Saturation Ratios: 11 % — ABNORMAL LOW (ref 17.9–39.5)
TIBC: 238 ug/dL — ABNORMAL LOW (ref 250–450)
UIBC: 213 ug/dL

## 2020-10-28 LAB — VITAMIN B12: Vitamin B-12: 331 pg/mL (ref 180–914)

## 2020-10-28 LAB — FOLATE: Folate: 7.1 ng/mL (ref >5.9)

## 2020-10-28 LAB — VITAMIN D 25 HYDROXY (VIT D DEFICIENCY, FRACTURES): Vit D, 25-Hydroxy: 32.4 ng/mL (ref 30–100)

## 2020-10-28 NOTE — Progress Notes (Signed)
Physical Therapy Treatment Patient Details Name: Christian [name] MRN: [id number] DOB: November 17, 1975 Today's Date: 10/28/2020    History of Present Illness Pt is a 44 y/o M who presented to ED on 10/09/20 with c/c of bilateral lower abdominal pain & c/o emesis twice that day after eating. Pt found to have abdominal wall purulent cellulitis. Pt admitted with clinical sepsis & AKI. PMH: morbid obesity, HTN. Pt with increased SOB starting 8/18 and CXR ordered with concern for free air. CT showed pneumoperitoneum suspicious for bowel perforation. Pt now s/p diagnostic laparoscopy converted to exploratory laparotomy, primary repair of cecal perforation and t/f to ICU on 8/19.    PT Comments    Pt was supine in bed with Hob elevated ~ 30 degrees. He is A and O x 3 but has poor insight of deficits. Overall very motivated to improve. He required a lot of assistance to exit L side of bed. Stood to bariatric RW with CGA. Ambulated 2 x  40 ft with RW with chair follow for safety. No  LOB however distance limited by fatigue. RR elevated to upper 30s with HR from 90s to 118 bpm. Pt was on 3 L upon arriving however placed on 2 L throughout session without desaturation. Sao2 > 92% on 2 L. Will continue to follow and progress as able per POC.   Follow Up Recommendations  SNF;Supervision/Assistance - 24 hour;Supervision for mobility/OOB     Equipment Recommendations  Other (comment);3in1 (PT) (Bariatric RW)       Precautions / Restrictions Precautions Precautions: Fall Restrictions Weight Bearing Restrictions: No    Mobility  Bed Mobility Overal bed mobility: Needs Assistance Bed Mobility: Supine to Sit;Sit to Supine Rolling: Min assist   Supine to sit: Mod assist     General bed mobility comments: pt required mod assist to achieve EOB short sit from flat bed    Transfers Overall transfer level: Needs assistance Equipment used: Rolling walker (2 wheeled) Transfers: Sit to/from Stand Sit to  Stand: Min guard         General transfer comment: CGA for safety to stand from slightly elevated bed height and from recliner. performed STS 4 x throughout session  Ambulation/Gait Ambulation/Gait assistance: Min guard Gait Distance (Feet): 40 Feet Assistive device: Rolling walker (2 wheeled) Gait Pattern/deviations: Step-through pattern Gait velocity: decreased   General Gait Details: 2 x 14ft with RW with chair follow for safety. HR elevated 118 and o2 dropped to 92 on 2 L but quickly elevates with seated rest.     Balance Overall balance assessment: Needs assistance Sitting-balance support: No upper extremity supported;Feet supported Sitting balance-[name]: Good     Standing balance support: Bilateral upper extremity supported;During functional activity Standing balance-[name]: Fair Standing balance comment: reliant on UE support for any dynamic standing activity      Cognition Arousal/Alertness: Awake/alert Behavior During Therapy: WFL for tasks assessed/performed Overall Cognitive Status: Within Functional Limits for tasks assessed      General Comments: Pt is A and O x 3 however poor insight of deficits             Pertinent Vitals/Pain Pain Assessment: No/denies pain Pain Score: 0-No pain Faces Pain Scale: No hurt Pain Intervention(s): Limited activity within patient's tolerance;Monitored during session;Premedicated before session;Repositioned     PT Goals (current goals can now be found in the care plan section) Acute Rehab PT Goals Patient Stated Goal: to get stronger Progress towards PT goals: Progressing toward goals    Frequency  Min 2X/week      PT Plan Current plan remains appropriate    Co-evaluation     PT goals addressed during session: Mobility/safety with mobility;Balance;Proper use of DME;Strengthening/ROM        AM-PAC PT "6 Clicks" Mobility   Outcome Measure  Help needed turning from your back to your side  while in a flat bed without using bedrails?: A Lot Help needed moving from lying on your back to sitting on the side of a flat bed without using bedrails?: A Lot Help needed moving to and from a bed to a chair (including a wheelchair)?: A Lot Help needed standing up from a chair using your arms (e.g., wheelchair or bedside chair)?: A Little Help needed to walk in hospital room?: A Little Help needed climbing 3-5 steps with a railing? : Total 6 Click Score: 13    End of Session Equipment Utilized During Treatment: Oxygen Activity Tolerance: Patient tolerated treatment well;Patient limited by fatigue Patient left: in chair;with call bell/phone within reach;with nursing/sitter in room Nurse Communication: Mobility status PT Visit Diagnosis: Muscle weakness (generalized) (M62.81);Difficulty in walking, not elsewhere classified (R26.2)     Time: 1325-1350 PT Time Calculation (min) (ACUTE ONLY): 25 min  Charges:  $Gait Training: 8-22 mins $Therapeutic Activity: 8-22 mins                     Jetta Lout PTA 10/28/20, 4:07 PM

## 2020-10-28 NOTE — Progress Notes (Signed)
PROGRESS NOTE    Christian [name]  ZDG:[id number] DOB: [birth year] DOA: 10/09/2020 PCP: Pcp, No   Brief Narrative:  45 y.o. male with medical history significant for morbid obesity, hypertension presents to the emergency department via EMS for chief concerns of bilateral lower abdominal pain.   He reports that the pain is in the bilateral lower abdominal region, described as sharp, 5/10 and is now improved to a 4/10. He vomited twice today after eating. He denies fever, chest pain, dysuria. He endorses feeling winded when getting from the ambulance to the ED bed. He could still breath.  He does endorse diarrhea that is brown liquidy stool.  He soiled himself because he states he was feeling so weak from the abdominal pain, he could not get up out of his car.  Initial diagnosis was abdominal wall purulent cellulitis.  Patient with increased work of breathing noted 8/18.  Chest x-ray with free air under diaphragm.  CT with pneumoperitoneum.  Status post diagnostic laparoscopy and repair of the cecal perforation.  Patient is hemodynamically stable however we are lacking safe discharge plan at this time.  Patient's friend discussed with CSW.  He is unable to let patient stay with him.  Patient's car is apparently filled with trash, roaches, bedbugs and is likely not safe for patient to return to.  No clear safe disposition plan   Assessment & Plan:   Principal Problem:   Severe sepsis (HCC) Active Problems:   Morbid obesity (HCC)   Hyperglycemia   AKI (acute kidney injury) (HCC)   Pressure ulcer of back   Pressure ulcer of buttock   Essential hypertension   Cellulitis of abdominal wall   Impaired fasting glucose   Acute respiratory failure with hypoxia (HCC)   Weakness   Acute on chronic respiratory failure with hypoxia and hypercapnia (HCC)   Bowel perforation (HCC)   Hypotension   Obesity hypoventilation syndrome (HCC)  Acute on chronic hypoxic and hypercarbic respiratory  failure Likely due to undiagnosed obstructive sleep apnea/obesity hypoventilation Mentating clearly On BiPAP at night, nasal cannula during the day Plan: Continue nasal cannula Wean as tolerated Nightly BiPAP Incentive spirometry and therapy evaluations, exert and ambulate as tolerated Continue Lasix 40 mg daily Continue to wean oxygen as tolerated Disposition major problem.  Patient on nasal cannula during the day and BiPAP at night.   Pneumoperitoneum with cecal perforation Postoperative day 11 Tolerating diet with documented bowel movement Completed course of antibiotics Plan: 8/31 General surgery: midline staples removed and very large amount of liquified hematoma evacuated at bedside.  Portion of wound opened to facilitate as much drainage as possible.  Hematoma cavity extend along entire staple line, but adequate drainage seems to have been achieved through the current opening at the lower portion.  Wound dressed with wet to dry.  Will reassess again in a couple days.  JP removed without any issues.   Severe sepsis secondary to abdominal wall and bilateral lower extremity cellulitis Also presented with AKI, now resolved Completed course of antibiotics Continue with wound care  Morbid obesity With suspected associated sleep apnea This complicates overall care and prognosis  Acute kidney injury Resolved  Weakness Functional decline Therapy recommending rehab but patient is uninsured and we are lacking safe disposition plan.  Patient also lives in his car    DVT prophylaxis: SQ Lovenox Code Status: Full Family Communication: Girlfriend at bedside 8/27 Disposition Plan: Status is: Inpatient  Remains inpatient appropriate because:Inpatient level of care appropriate due to severity  of illness  Dispo: The patient is from: Home              Anticipated d/c is to:  TBD              Patient currently is medically stable to d/c.   Difficult to place patient  Yes   Patient is essentially medically ready for discharge at this time.  We are currently lacking safe disposition plan.  Patient is on NIPPV at night and dependent nasal cannula during the day.  Patient is uninsured and disposition is quite a challenge.  He is also homeless and lives in his car.  Apparently the car is not safe to return to.   Level of care: Med-Surg  Consultants:  General surgery  Procedures:  Exploratory laparotomy  Antimicrobials:  None   Subjective: No overnight significant issues, patient was having abdominal pain 2/10, generalized soreness.  No nausea vomiting or diarrhea.  Denied any other active issues.  Patient stated that he walked with a walker in the hallway yesterday.   Objective: Vitals:   10/28/20 0503 10/28/20 0755 10/28/20 0900 10/28/20 1558  BP: 129/75 118/70  123/79  Pulse: 78 84  87  Resp: 16 20 17 18   Temp: 97.6 F (36.4 C) 98 F (36.7 C)  98.1 F (36.7 C)  TempSrc: Oral Oral  Oral  SpO2: 100% 97%  97%  Weight:      Height:        Intake/Output Summary (Last 24 hours) at 10/28/2020 1737 Last data filed at 10/28/2020 1416 Gross per 24 hour  Intake 840 ml  Output 150 ml  Net 690 ml   Filed Weights   10/09/20 0807 10/15/20 1600  Weight: (!) 197.3 kg (!) 201.6 kg    Examination:  General exam: No acute distress.  Appears chronically ill Respiratory system: Lung sounds decreased at bases.  Bibasilar rhonchi.  Normal work of breathing.  2 L Cardiovascular system: Distant heart sounds, S1-S2, regular rate and rhythm, no murmurs, nonpitting peripheral edema bilateral lower extremities Gastrointestinal system: Obese, mildly distended abdomen, midline incision dressing slightly soaked with dried blood, no active discharge.  Mild generalized tenderness.  Central nervous system: Alert and oriented. No focal neurological deficits. Extremities: Symmetric 5 x 5 power. Skin: Bilateral buttock wounds Psychiatry: Judgement and insight  appear normal. Mood & affect appropriate.     Data Reviewed: I have personally reviewed following labs and imaging studies  CBC: Recent Labs  Lab 10/23/20 0619  WBC 9.7  HGB 12.1*  HCT 38.5*  MCV 83.3  PLT 409*   Basic Metabolic Panel: Recent Labs  Lab 10/23/20 0619  NA 136  K 4.0  CL 98  CO2 31  GLUCOSE 109*  BUN 16  CREATININE 0.88  CALCIUM 7.9*   GFR: Estimated Creatinine Clearance: 194.8 mL/min (by C-G formula based on SCr of 0.88 mg/dL). Liver Function Tests: No results for input(s): AST, ALT, ALKPHOS, BILITOT, PROT, ALBUMIN in the last 168 hours. No results for input(s): LIPASE, AMYLASE in the last 168 hours. No results for input(s): AMMONIA in the last 168 hours. Coagulation Profile: No results for input(s): INR, PROTIME in the last 168 hours. Cardiac Enzymes: No results for input(s): CKTOTAL, CKMB, CKMBINDEX, TROPONINI in the last 168 hours. BNP (last 3 results) No results for input(s): PROBNP in the last 8760 hours. HbA1C: No results for input(s): HGBA1C in the last 72 hours. CBG: No results for input(s): GLUCAP in the last 168 hours.  Lipid  Profile: No results for input(s): CHOL, HDL, LDLCALC, TRIG, CHOLHDL, LDLDIRECT in the last 72 hours. Thyroid Function Tests: No results for input(s): TSH, T4TOTAL, FREET4, T3FREE, THYROIDAB in the last 72 hours. Anemia Panel: Recent Labs    10/28/20 1018  VITAMINB12 331  FOLATE 7.1  TIBC 238*  IRON 25*   Sepsis Labs: No results for input(s): PROCALCITON, LATICACIDVEN in the last 168 hours.  No results found for this or any previous visit (from the past 240 hour(s)).        Radiology Studies: No results found.      Scheduled Meds:  (feeding supplement) PROSource Plus  30 mL Oral TID BM   collagenase   Topical Daily   enoxaparin (LOVENOX) injection  100 mg Subcutaneous Q24H   furosemide  40 mg Oral Daily   multivitamin with minerals  1 tablet Oral Daily   nutrition supplement (JUVEN)  1  packet Oral BID BM   potassium chloride  20 mEq Oral BID   Continuous Infusions:   LOS: 18 days    Time spent: 15 minutes    Gillis Santa, MD [facility] Pager [phone]  If 7PM-7AM, please contact night-coverage 10/28/2020, 5:37 PM

## 2020-10-28 NOTE — Progress Notes (Signed)
Occupational Therapy Treatment Patient Details Name: Christian [name] MRN: [id number] DOB: [birth year] Today's Date: 10/28/2020    History of present illness Pt is a 45 y/o M who presented to ED on 10/09/20 with c/c of bilateral lower abdominal pain & c/o emesis twice that day after eating. Pt found to have abdominal wall purulent cellulitis. Pt admitted with clinical sepsis & AKI. PMH: morbid obesity, HTN. Pt with increased SOB starting 8/18 and CXR ordered with concern for free air. CT showed pneumoperitoneum suspicious for bowel perforation. Pt now s/p diagnostic laparoscopy converted to exploratory laparotomy, primary repair of cecal perforation and t/f to ICU on 8/19.   OT comments  Christian [name] was seen for OT treatment on this date. Upon arrival to room pt reclined in chair pleasant and agreeable to session. Pt requires close SBA + metal bariatric RW for BSC t/f. Pt attempted perihygeine in standing using long handed tool for wiping - fair balance c single UE support however continues to require MAX A to complete thoroughly. Pt return verbalize 2/4 exercises for HEP, plan to provide theraband next session. Pt making good progress toward goals. Pt continues to benefit from skilled OT services to maximize return to PLOF and minimize risk of future falls, injury, caregiver burden, and readmission. Will continue to follow POC. Discharge recommendation remains appropriate.    Follow Up Recommendations  SNF;Supervision/Assistance - 24 hour    Equipment Recommendations  Other (comment) (TBD)    Recommendations for Other Services      Precautions / Restrictions Precautions Precautions: Fall Restrictions Weight Bearing Restrictions: No       Mobility Bed Mobility   General bed mobility comments: found and left in chair    Transfers Overall transfer level: Needs assistance Equipment used: Rolling walker (2 wheeled) Transfers: Sit to/from Stand Sit to Stand: Min guard         General  transfer comment: multiple standing trials with no direct physical assist    Balance Overall balance assessment: Needs assistance Sitting-balance support: No upper extremity supported;Feet supported Sitting balance-[name]: Good Sitting balance - Comments: no LOB with sitting balance/ reaching inside BOS/outside BOS   Standing balance support: Single extremity supported;During functional activity Standing balance-[name]: Fair Standing balance comment: close standby with single UE support                           ADL either performed or assessed with clinical judgement   ADL Overall ADL's : Needs assistance/impaired                                       General ADL Comments: Close SBA + metal bariatric RW for BSC t/f. Pt attempted perihygeine in standing using long handed tool for wiping - fair balance c single UE support however continues to require MAX A to complete thoroughly.      Cognition Arousal/Alertness: Awake/alert Behavior During Therapy: WFL for tasks assessed/performed Overall Cognitive Status: Within Functional Limits for tasks assessed                                 General Comments: Pt is A and O x 3 however poor insight of deficits        Exercises Exercises: Other exercises Other Exercises Other Exercises: Pt educated re:HEP, adapted toileting  strategies Other Exercises: toileting, SPT, sit<>stand x4, sitting.standing balance/tolerance, ~10 ft mobility   Shoulder Instructions       General Comments Found on 3L [place], SpO2 mid 90s on 2L [place]    Pertinent Vitals/ Pain       Pain Assessment: No/denies pain Pain Score: 0-No pain Faces Pain Scale: No hurt Pain Intervention(s): Limited activity within patient's tolerance;Monitored during session;Premedicated before session;Repositioned   Frequency  Min 2X/week        Progress Toward Goals  OT Goals(current goals can now be found in the care plan  section)  Progress towards OT goals: Progressing toward goals  Acute Rehab OT Goals Patient Stated Goal: to get stronger OT Goal Formulation: With patient Time For Goal Achievement: 10/31/20 Potential to Achieve Goals: Good ADL Goals Pt Will Perform Upper Body Dressing: with supervision;sitting Pt Will Perform Lower Body Dressing: with mod assist;sit to/from stand;with adaptive equipment Pt Will Transfer to Toilet: with mod assist;with +2 assist;squat pivot transfer;bedside commode Pt/caregiver will Perform Home Exercise Program: Both right and left upper extremity;With theraband;With written HEP provided;Independently;Increased strength  Plan Discharge plan remains appropriate;Frequency remains appropriate    Co-evaluation        PT goals addressed during session: Mobility/safety with mobility;Balance;Proper use of DME;Strengthening/ROM        AM-PAC OT "6 Clicks" Daily Activity     Outcome Measure   Help from another person eating meals?: None Help from another person taking care of personal grooming?: A Little Help from another person toileting, which includes using toliet, bedpan, or urinal?: A Lot Help from another person bathing (including washing, rinsing, drying)?: A Lot Help from another person to put on and taking off regular upper body clothing?: A Little Help from another person to put on and taking off regular lower body clothing?: A Lot 6 Click Score: 16    End of Session Equipment Utilized During Treatment: Oxygen;Rolling walker  OT Visit Diagnosis: Unsteadiness on feet (R26.81);Muscle weakness (generalized) (M62.81)   Activity Tolerance Patient tolerated treatment well   Patient Left in chair;with call bell/phone within reach;with nursing/sitter in room   Nurse Communication Mobility status        Time: 1901-2224 OT Time Calculation (min): 25 min  Charges: OT General Charges $OT Visit: 1 Visit OT Treatments $Self Care/Home Management : 23-37  mins  Kathie Dike, M.S. OTR/L  10/28/20, 4:44 PM  ascom [phone]

## 2020-10-28 NOTE — Progress Notes (Signed)
Subjective:  CC: Christian [name] is a 45 y.o. male  Hospital stay day 18, 13 Days Post-Op ex-lap cecal perforation repair  HPI: No acute issues overnight. States he has been having some drainage from midline wound  ROS:  General: Denies weight loss, weight gain, fatigue, fevers, chills, and night sweats. Heart: Denies chest pain, palpitations, racing heart, irregular heartbeat, leg pain or swelling, and decreased activity tolerance. Respiratory: Denies breathing difficulty, shortness of breath, wheezing, cough, and sputum. GI: Denies change in appetite, heartburn, nausea, vomiting, constipation, diarrhea, and blood in stool. GU: Denies difficulty urinating, pain with urinating, urgency, frequency, blood in urine.   Objective:   Temp:  [97.6 F (36.4 C)-98.1 F (36.7 C)] 98 F (36.7 C) (08/31 0755) Pulse Rate:  [78-92] 84 (08/31 0755) Resp:  [16-20] 17 (08/31 0900) BP: (118-140)/(70-87) 118/70 (08/31 0755) SpO2:  [95 %-100 %] 97 % (08/31 0755)     Height: 6\' 3"  (190.5 cm) Weight: (!) 201.6 kg BMI (Calculated): 55.55   Intake/Output this shift:   Intake/Output Summary (Last 24 hours) at 10/28/2020 0950 Last data filed at 10/27/2020 1915 Gross per 24 hour  Intake 480 ml  Output 400 ml  Net 80 ml    Constitutional :  alert, cooperative, appears stated age, and no distress  Respiratory:  clear to auscultation bilaterally  Cardiovascular:  regular rate and rhythm  Gastrointestinal: Soft, no guarding. Midline wound healed, JP with serous drainage.  Most inferior aspect noted to have old hematoma drainage.  Skin: Cool and moist.   Psychiatric: Normal affect, non-agitated, not confused       LABS:  CMP Latest Ref Rng & Units 10/23/2020 10/21/2020 10/20/2020  Glucose 70 - 99 mg/dL 10/22/2020) 720(N) 99  BUN 6 - 20 mg/dL 16 16 15   Creatinine 0.61 - 1.24 mg/dL 470(J 6.28  Sodium 135 - 145 mmol/L 136 137 140  Potassium 3.5 - 5.1 mmol/L 4.0 4.2 4.3  Chloride 98 - 111 mmol/L 98 98 98   CO2 22 - 32 mmol/L 31 28 32  Calcium 8.9 - 10.3 mg/dL 7.9(L) 8.4(L) 8.4(L)  Total Protein 6.5 - 8.1 g/dL - - -  Total Bilirubin 0.3 - 1.2 mg/dL - - -  Alkaline Phos 38 - 126 U/L - - -  AST 15 - 41 U/L - - -  ALT 0 - 44 U/L - - -   CBC Latest Ref Rng & Units 10/23/2020 10/19/2020 10/17/2020  WBC 4.0 - 10.5 K/uL 9.7 9.1 14.5(H)  Hemoglobin 13.0 - 17.0 g/dL 12.1(L) 12.9(L) 11.3(L)  Hematocrit 39.0 - 52.0 % 38.5(L) 42.3 36.7(L)  Platelets 150 - 400 K/uL 409(H) 440(H) 393    RADS: N/a Assessment:   S/p cecal perforation repair. Midline staples removed and very large amount of liquified hematoma evacuated at bedside.  Portion of wound opened to facilitate as much drainage as possible.  Hematoma cavity extend along entire staple line, but adequate drainage seems to have been achieved through the current opening at the lower portion.  Wound dressed with wet to dry.  Will reassess again in a couple days.  JP removed without any issues.

## 2020-10-29 LAB — CBC
HCT: 35.7 % — ABNORMAL LOW (ref 39.0–52.0)
Hemoglobin: 11.5 g/dL — ABNORMAL LOW (ref 13.0–17.0)
MCH: 27.5 pg (ref 26.0–34.0)
MCHC: 32.2 g/dL (ref 30.0–36.0)
MCV: 85.4 fL (ref 80.0–100.0)
Platelets: 390 10*3/uL (ref 150–400)
RBC: 4.18 MIL/uL — ABNORMAL LOW (ref 4.22–5.81)
RDW: 14.4 % (ref 11.5–15.5)
WBC: 7.4 10*3/uL (ref 4.0–10.5)
nRBC: 0 % (ref 0.0–0.2)

## 2020-10-29 LAB — BASIC METABOLIC PANEL
Anion gap: 7 (ref 5–15)
BUN: 18 mg/dL (ref 6–20)
CO2: 33 mmol/L — ABNORMAL HIGH (ref 22–32)
Calcium: 8.3 mg/dL — ABNORMAL LOW (ref 8.9–10.3)
Chloride: 95 mmol/L — ABNORMAL LOW (ref 98–111)
Creatinine, Ser: 0.81 mg/dL (ref 0.61–1.24)
GFR, Estimated: >60 mL/min (ref >60)
Glucose, Bld: 111 mg/dL — ABNORMAL HIGH (ref 70–99)
Potassium: 3.8 mmol/L (ref 3.5–5.1)
Sodium: 135 mmol/L (ref 135–145)

## 2020-10-29 LAB — MAGNESIUM: Magnesium: 2.1 mg/dL (ref 1.7–2.4)

## 2020-10-29 LAB — PHOSPHORUS: Phosphorus: 4.5 mg/dL (ref 2.5–4.6)

## 2020-10-29 LAB — GLUCOSE, CAPILLARY: Glucose-Capillary: 142 mg/dL — ABNORMAL HIGH (ref 70–99)

## 2020-10-29 MED ORDER — VITAMIN B-12 100 MCG PO TABS
100.0000 ug | ORAL_TABLET | Freq: Every day | ORAL | Status: DC
  Administered 2020-11-05 – 2020-11-30 (×26): 100 ug via ORAL
  Filled 2020-10-29 (×26): qty 1

## 2020-10-29 MED ORDER — ASCORBIC ACID 500 MG PO TABS
500.0000 mg | ORAL_TABLET | Freq: Every day | ORAL | Status: DC
  Administered 2020-10-29 – 2020-11-05 (×8): 500 mg via ORAL
  Filled 2020-10-29 (×8): qty 1

## 2020-10-29 MED ORDER — CYANOCOBALAMIN 1000 MCG/ML IJ SOLN
1000.0000 ug | Freq: Every day | INTRAMUSCULAR | Status: AC
  Administered 2020-10-29 – 2020-11-04 (×7): 1000 ug via INTRAMUSCULAR
  Filled 2020-10-29 (×7): qty 1

## 2020-10-29 MED ORDER — VITAMIN D (ERGOCALCIFEROL) 1.25 MG (50000 UNIT) PO CAPS
50000.0000 [IU] | ORAL_CAPSULE | ORAL | Status: DC
Start: 2020-10-29 — End: 2020-12-01
  Administered 2020-10-29 – 2020-11-26 (×5): 50000 [IU] via ORAL
  Filled 2020-10-29 (×5): qty 1

## 2020-10-29 MED ORDER — ENSURE ENLIVE PO LIQD
237.0000 mL | Freq: Three times a day (TID) | ORAL | Status: DC
  Administered 2020-10-29 – 2020-11-30 (×93): 237 mL via ORAL

## 2020-10-29 MED ORDER — POLYSACCHARIDE IRON COMPLEX 150 MG PO CAPS
150.0000 mg | ORAL_CAPSULE | Freq: Every day | ORAL | Status: DC
  Administered 2020-10-29 – 2020-11-30 (×33): 150 mg via ORAL
  Filled 2020-10-29 (×33): qty 1

## 2020-10-29 NOTE — Plan of Care (Signed)
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
## 2020-10-29 NOTE — Progress Notes (Signed)
PROGRESS NOTE    Christian [name]  UTM:[id number] DOB: [birth year] DOA: 10/09/2020 PCP: Pcp, No   Brief Narrative:  45 y.o. male with medical history significant for morbid obesity, hypertension presents to the emergency department via EMS for chief concerns of bilateral lower abdominal pain.   He reports that the pain is in the bilateral lower abdominal region, described as sharp, 5/10 and is now improved to a 4/10. He vomited twice today after eating. He denies fever, chest pain, dysuria. He endorses feeling winded when getting from the ambulance to the ED bed. He could still breath.  He does endorse diarrhea that is brown liquidy stool.  He soiled himself because he states he was feeling so weak from the abdominal pain, he could not get up out of his car.  Initial diagnosis was abdominal wall purulent cellulitis.  Patient with increased work of breathing noted 8/18.  Chest x-ray with free air under diaphragm.  CT with pneumoperitoneum.  Status post diagnostic laparoscopy and repair of the cecal perforation.  Patient is hemodynamically stable however we are lacking safe discharge plan at this time.  Patient's friend discussed with CSW.  He is unable to let patient stay with him.  Patient's car is apparently filled with trash, roaches, bedbugs and is likely not safe for patient to return to.  No clear safe disposition plan   Assessment & Plan:   Principal Problem:   Severe sepsis (HCC) Active Problems:   Morbid obesity (HCC)   Hyperglycemia   AKI (acute kidney injury) (HCC)   Pressure ulcer of back   Pressure ulcer of buttock   Essential hypertension   Cellulitis of abdominal wall   Impaired fasting glucose   Acute respiratory failure with hypoxia (HCC)   Weakness   Acute on chronic respiratory failure with hypoxia and hypercapnia (HCC)   Bowel perforation (HCC)   Hypotension   Obesity hypoventilation syndrome (HCC)  Acute on chronic hypoxic and hypercarbic respiratory  failure Likely due to undiagnosed obstructive sleep apnea/obesity hypoventilation Mentating clearly On BiPAP at night, nasal cannula during the day Plan: Continue nasal cannula Wean as tolerated Nightly BiPAP Incentive spirometry and therapy evaluations, exert and ambulate as tolerated Continue Lasix 40 mg daily Continue to wean oxygen as tolerated Disposition major problem.  Patient on nasal cannula during the day and BiPAP at night.   Pneumoperitoneum with cecal perforation Postoperative day 11 Tolerating diet with documented bowel movement Completed course of antibiotics Plan: 8/31 General surgery: midline staples removed and very large amount of liquified hematoma evacuated at bedside.  Portion of wound opened to facilitate as much drainage as possible.  Hematoma cavity extend along entire staple line, but adequate drainage seems to have been achieved through the current opening at the lower portion.  Wound dressed with wet to dry.  Will reassess again in a couple days.  JP removed without any issues.   Severe sepsis secondary to abdominal wall and bilateral lower extremity cellulitis Also presented with AKI, now resolved Completed course of antibiotics Continue with wound care  Morbid obesity With suspected associated sleep apnea This complicates overall care and prognosis  Acute kidney injury Resolved  Weakness Functional decline Therapy recommending rehab but patient is uninsured and we are lacking safe disposition plan.  Patient also lives in his car  Iron deficiency, iron saturation 11%, started oral iron supplement with vitamin C Vitamin B12 level 331, target >400, started vitamin B12 1000 mcg IM injection daily for 7 days and then start  oral supplement. Vitamin D level 32, started oral supplement.   DVT prophylaxis: SQ Lovenox Code Status: Full Family Communication: Girlfriend at bedside 8/27 Disposition Plan: Status is: Inpatient  Remains inpatient  appropriate because:Inpatient level of care appropriate due to severity of illness  Dispo: The patient is from: Home              Anticipated d/c is to:  TBD              Patient currently is medically stable to d/c.   Difficult to place patient Yes   Patient is essentially medically ready for discharge at this time.  We are currently lacking safe disposition plan.  Patient is on NIPPV at night and dependent nasal cannula during the day.  Patient is uninsured and disposition is quite a challenge.  He is also homeless and lives in his car.  Apparently the car is not safe to return to.   Level of care: Med-Surg  Consultants:  General surgery  Procedures:  Exploratory laparotomy  Antimicrobials:  None   Subjective: No overnight significant issues, patient just woke up, denied any active issues, stated that abdomen is still sore pain level is 1-2/10, any worsening of shortness of breath, no chest pain, no palpitations and no any other active issues. Patient stated that he did walk yesterday with physical therapy using walker.  Patient was encouraged to walk more so that we can plan for disposition soon   Objective: Vitals:   10/28/20 1955 10/29/20 0015 10/29/20 0411 10/29/20 0802  BP: 114/63  123/72 117/70  Pulse: 87  81 75  Resp:  17  16  Temp: 97.8 F (36.6 C)  98.5 F (36.9 C) 98.6 F (37 C)  TempSrc: Oral  Axillary   SpO2: 96%  98% 99%  Weight:      Height:        Intake/Output Summary (Last 24 hours) at 10/29/2020 1236 Last data filed at 10/29/2020 1204 Gross per 24 hour  Intake 600 ml  Output 1575 ml  Net -975 ml   Filed Weights   10/09/20 0807 10/15/20 1600  Weight: (!) 197.3 kg (!) 201.6 kg    Examination:  General exam: No acute distress.  Appears chronically ill Respiratory system: Lung sounds decreased at bases.  Bibasilar rhonchi.  Normal work of breathing.  2 L Cardiovascular system: Distant heart sounds, S1-S2, regular rate and rhythm, no murmurs,  nonpitting peripheral edema bilateral lower extremities Gastrointestinal system: Obese, mildly distended abdomen, midline incision dressing slightly soaked with dried blood, no active discharge.  Mild generalized tenderness.  Central nervous system: Alert and oriented. No focal neurological deficits. Extremities: Symmetric 5 x 5 power. Skin: Bilateral buttock wounds Psychiatry: Judgement and insight appear normal. Mood & affect appropriate.     Data Reviewed: I have personally reviewed following labs and imaging studies  CBC: Recent Labs  Lab 10/23/20 0619 10/29/20 0624  WBC 9.7 7.4  HGB 12.1* 11.5*  HCT 38.5* 35.7*  MCV 83.3 85.4  PLT 409* 390   Basic Metabolic Panel: Recent Labs  Lab 10/23/20 0619 10/29/20 0624  NA 136 135  K 4.0 3.8  CL 98 95*  CO2 31 33*  GLUCOSE 109* 111*  BUN 16 18  CREATININE 0.88 0.81  CALCIUM 7.9* 8.3*  MG  --  2.1  PHOS  --  4.5   GFR: Estimated Creatinine Clearance: 211.6 mL/min (by C-G formula based on SCr of 0.81 mg/dL). Liver Function Tests: No results for  input(s): AST, ALT, ALKPHOS, BILITOT, PROT, ALBUMIN in the last 168 hours. No results for input(s): LIPASE, AMYLASE in the last 168 hours. No results for input(s): AMMONIA in the last 168 hours. Coagulation Profile: No results for input(s): INR, PROTIME in the last 168 hours. Cardiac Enzymes: No results for input(s): CKTOTAL, CKMB, CKMBINDEX, TROPONINI in the last 168 hours. BNP (last 3 results) No results for input(s): PROBNP in the last 8760 hours. HbA1C: No results for input(s): HGBA1C in the last 72 hours. CBG: No results for input(s): GLUCAP in the last 168 hours.  Lipid Profile: No results for input(s): CHOL, HDL, LDLCALC, TRIG, CHOLHDL, LDLDIRECT in the last 72 hours. Thyroid Function Tests: No results for input(s): TSH, T4TOTAL, FREET4, T3FREE, THYROIDAB in the last 72 hours. Anemia Panel: Recent Labs    10/28/20 1018  VITAMINB12 331  FOLATE 7.1  TIBC 238*   IRON 25*   Sepsis Labs: No results for input(s): PROCALCITON, LATICACIDVEN in the last 168 hours.  No results found for this or any previous visit (from the past 240 hour(s)).        Radiology Studies: No results found.      Scheduled Meds:  (feeding supplement) PROSource Plus  30 mL Oral TID BM   vitamin C  500 mg Oral Daily   collagenase   Topical Daily   cyanocobalamin  1,000 mcg Intramuscular Daily   enoxaparin (LOVENOX) injection  100 mg Subcutaneous Q24H   furosemide  40 mg Oral Daily   iron polysaccharides  150 mg Oral Daily   multivitamin with minerals  1 tablet Oral Daily   nutrition supplement (JUVEN)  1 packet Oral BID BM   potassium chloride  20 mEq Oral BID   [START ON 11/05/2020] vitamin B-12  100 mcg Oral Daily   Vitamin D (Ergocalciferol)  50,000 Units Oral Q7 days   Continuous Infusions:   LOS: 19 days    Time spent: 15 minutes    Gillis Santa, MD [facility] Pager [phone]  If 7PM-7AM, please contact night-coverage 10/29/2020, 12:36 PM

## 2020-10-29 NOTE — Progress Notes (Signed)
Nutrition Follow-up  DOCUMENTATION CODES:   Morbid obesity  INTERVENTION:   Ensure Enlive po TID, each supplement provides 350 kcal and 20 grams of protein  Juven Fruit Punch BID, each serving provides 95kcal and 2.5g of protein (amino acids glutamine and arginine)  MVI po daily   Double protein with meals   NUTRITION DIAGNOSIS:   Increased nutrient needs related to wound healing as evidenced by estimated needs.  GOAL:   Patient will meet greater than or equal to 90% of their needs -met   MONITOR:   PO intake, Supplement acceptance, Labs, Weight trends, Skin, I & O's  ASSESSMENT:   45 yo male with a PMH of morbid obesity and HTN who presents with severe sepsis 2/2 abdominal wall and bilateral lower extremity cellulitis. Also with pneumoperitoneum with cecal perforation (s/p ex lap) and AKI (now resolved).  Met with pt in room today. Pt reports good appetite and oral intake in hospital; pt eating 100% of meals. Pt reports that he is drinking the Juven and the ProSource supplements. Pt reports that he does not really like the ProSource Plus and would prefer something strawberry is possible. RD will discontinue ProSource and add Ensure Enlive. No new weight since 8/18; will request weekly weights.   Medications reviewed and include: vitamin C, B12, lovenox, lasix, iron polysaccharides, MVI, juven, KCl, vitamin D  Labs reviewed: K 3.8 wnl, P 4.5 wnl, Mg 2.1 wnl Vitamin D 32.40- 8/31  Diet Order:   Diet Order             Diet Heart Room service appropriate? Yes; Fluid consistency: Thin  Diet effective 1400                  EDUCATION NEEDS:   Education needs have been addressed  Skin:  Skin Assessment: Skin Integrity Issues: Skin Integrity Issues:: Stage II, Unstageable, Incisions Stage II: L heel, R heel, R foot Unstageable: L buttocks and R buttocks Incisions: Abdomen, closed  Last BM:  8/30- type 6  Height:   Ht Readings from Last 1 Encounters:   10/09/20 6' 3"  (1.905 m)    Weight:   Wt Readings from Last 1 Encounters:  10/15/20 (!) 201.6 kg   BMI:  Body mass index is 55.55 kg/m.  Estimated Nutritional Needs:   Kcal:  3200-3500kcal/day  Protein:  >160g/day  Fluid:  2.7-3.0L/day  Koleen Distance MS, RD, LDN Please refer to [facility] for RD and/or RD on-call/weekend/after hours pager

## 2020-10-29 NOTE — Progress Notes (Signed)
Physical Therapy Treatment Patient Details Name: Christian [name] MRN: [id number] DOB: 1975/07/24 Today's Date: 10/29/2020    History of Present Illness Pt is a 45 y/o M who presented to ED on 10/09/20 with c/c of bilateral lower abdominal pain & c/o emesis twice that day after eating. Pt found to have abdominal wall purulent cellulitis. Pt admitted with clinical sepsis & AKI. PMH: morbid obesity, HTN. Pt with increased SOB starting 8/18 and CXR ordered with concern for free air. CT showed pneumoperitoneum suspicious for bowel perforation. Pt now s/p diagnostic laparoscopy converted to exploratory laparotomy, primary repair of cecal perforation and t/f to ICU on 8/19.    PT Comments    Pt was long sitting in bed upon arriving. He agrees to session and is cooperative throughout. Sao2 on 3 L was 96%. Decreased to 2 L o2 throughout remainder of session with sao2 dropping to 90% during ambulation. He continues to required mod assist to exit bed. Bed mobility most limiting/biggest current deficits. Once sitting, he requires CGA for standing and ambulating. He ambulated 3 x ~ 100 ft each with bariatric RW (max wt 750lbs). Prolonged rest between gait trials. Overall tolerated well but does endorse severe fatigue after 3 x ambulating. Pt will greatly benefit from SNF at DC however due to lack of payment source, may not be a option.    Follow Up Recommendations  SNF;Supervision/Assistance - 24 hour;Supervision for mobility/OOB     Equipment Recommendations  Other (comment);3in1 (PT);Rolling walker with 5" wheels (Bariatric)       Precautions / Restrictions Precautions Precautions: Fall Restrictions Weight Bearing Restrictions: No    Mobility  Bed Mobility Overal bed mobility: Needs Assistance Bed Mobility: Supine to Sit;Sit to Supine     Supine to sit: Mod assist     General bed mobility comments: pt continues to require mod assist to exit L side of bed with heavy use of bed rails and vcs  for improved technique and sequencing    Transfers Overall transfer level: Needs assistance Equipment used: Rolling walker (2 wheeled) (Bariatric) Transfers: Sit to/from Stand Sit to Stand: Min guard         General transfer comment: CGA for safety for standing with vcs for handplacement and increased fwd wt shift.  Ambulation/Gait Ambulation/Gait assistance: Min guard Gait Distance (Feet): 100 Feet Assistive device: Rolling walker (2 wheeled) (bariatric) Gait Pattern/deviations: Step-through pattern Gait velocity: decreased   General Gait Details: Pt was able to ambulate 3 x ~ 100 ft each trial. HR did elevate to 128. RR elevated to 32bpm. He requires prolonged seated rest to recover. Was on 2 L o2 throughout with sao2 never dropping below 90%.     Balance Overall balance assessment: Needs assistance Sitting-balance support: No upper extremity supported;Feet supported Sitting balance-[name]: Good     Standing balance support: Bilateral upper extremity supported;During functional activity Standing balance-[name]: Fair      Cognition Arousal/Alertness: Awake/alert Behavior During Therapy: WFL for tasks assessed/performed Overall Cognitive Status: Within Functional Limits for tasks assessed   General Comments: Pt is A and O x 3 however poor insight of deficits overall medical concerns             Pertinent Vitals/Pain Pain Assessment: No/denies pain Pain Score: 0-No pain Faces Pain Scale: No hurt Pain Location: posterior leg wounds Pain Intervention(s): Limited activity within patient's tolerance;Monitored during session;Repositioned     PT Goals (current goals can now be found in the care plan section) Acute Rehab PT  Goals Patient Stated Goal: be able to return to work again Progress towards PT goals: Progressing toward goals    Frequency    Min 2X/week      PT Plan Current plan remains appropriate       AM-PAC PT "6 Clicks" Mobility    Outcome Measure  Help needed turning from your back to your side while in a flat bed without using bedrails?: A Lot Help needed moving from lying on your back to sitting on the side of a flat bed without using bedrails?: A Lot Help needed moving to and from a bed to a chair (including a wheelchair)?: A Little Help needed standing up from a chair using your arms (e.g., wheelchair or bedside chair)?: A Little Help needed to walk in hospital room?: A Little Help needed climbing 3-5 steps with a railing? : Total 6 Click Score: 14    End of Session Equipment Utilized During Treatment: Oxygen Activity Tolerance: Patient tolerated treatment well;Patient limited by fatigue Patient left: in chair;with call bell/phone within reach;with nursing/sitter in room Nurse Communication: Mobility status PT Visit Diagnosis: Muscle weakness (generalized) (M62.81);Difficulty in walking, not elsewhere classified (R26.2)     Time: 6301-6010 PT Time Calculation (min) (ACUTE ONLY): 33 min  Charges:  $Gait Training: 23-37 mins                     Jetta Lout PTA 10/29/20, 4:40 PM

## 2020-10-30 LAB — BASIC METABOLIC PANEL
Anion gap: 4 — ABNORMAL LOW (ref 5–15)
BUN: 20 mg/dL (ref 6–20)
CO2: 35 mmol/L — ABNORMAL HIGH (ref 22–32)
Calcium: 8.4 mg/dL — ABNORMAL LOW (ref 8.9–10.3)
Chloride: 99 mmol/L (ref 98–111)
Creatinine, Ser: 0.8 mg/dL (ref 0.61–1.24)
GFR, Estimated: >60 mL/min (ref >60)
Glucose, Bld: 115 mg/dL — ABNORMAL HIGH (ref 70–99)
Potassium: 3.8 mmol/L (ref 3.5–5.1)
Sodium: 138 mmol/L (ref 135–145)

## 2020-10-30 LAB — CBC
HCT: 37.7 % — ABNORMAL LOW (ref 39.0–52.0)
Hemoglobin: 11.9 g/dL — ABNORMAL LOW (ref 13.0–17.0)
MCH: 26.5 pg (ref 26.0–34.0)
MCHC: 31.6 g/dL (ref 30.0–36.0)
MCV: 84 fL (ref 80.0–100.0)
Platelets: 441 10*3/uL — ABNORMAL HIGH (ref 150–400)
RBC: 4.49 MIL/uL (ref 4.22–5.81)
RDW: 14.3 % (ref 11.5–15.5)
WBC: 7.9 10*3/uL (ref 4.0–10.5)
nRBC: 0 % (ref 0.0–0.2)

## 2020-10-30 LAB — MAGNESIUM: Magnesium: 2.4 mg/dL (ref 1.7–2.4)

## 2020-10-30 LAB — PHOSPHORUS: Phosphorus: 5.1 mg/dL — ABNORMAL HIGH (ref 2.5–4.6)

## 2020-10-30 NOTE — Progress Notes (Signed)
Subjective:  CC: Christian [name] is a 45 y.o. male  Hospital stay day 20, 15 Days Post-Op ex-lap cecal perforation repair  HPI: No acute issues overnight. Still having some drainage from midline wound  ROS:  General: Denies weight loss, weight gain, fatigue, fevers, chills, and night sweats. Heart: Denies chest pain, palpitations, racing heart, irregular heartbeat, leg pain or swelling, and decreased activity tolerance. Respiratory: Denies breathing difficulty, shortness of breath, wheezing, cough, and sputum. GI: Denies change in appetite, heartburn, nausea, vomiting, constipation, diarrhea, and blood in stool. GU: Denies difficulty urinating, pain with urinating, urgency, frequency, blood in urine.   Objective:   Temp:  [97.7 F (36.5 C)-98.3 F (36.8 C)] 97.7 F (36.5 C) (09/02 1526) Pulse Rate:  [81-90] 90 (09/02 1526) Resp:  [14-26] 18 (09/02 1526) BP: (125-130)/(72-80) 128/80 (09/02 1526) SpO2:  [94 %-96 %] 94 % (09/02 1526)     Height: 6\' 3"  (190.5 cm) Weight: (!) 201.6 kg BMI (Calculated): 55.55   Intake/Output this shift:   Intake/Output Summary (Last 24 hours) at 10/30/2020 1606 Last data filed at 10/30/2020 1412 Gross per 24 hour  Intake 957 ml  Output 1175 ml  Net -218 ml    Constitutional :  alert, cooperative, appears stated age, and no distress  Respiratory:  clear to auscultation bilaterally  Cardiovascular:  regular rate and rhythm  Gastrointestinal: Soft, no guarding. Midline wound dressing with scant old hematoma discharge, no evidence of active infections such as cellulitis, purulent drainage.  Skin: Cool and moist.   Psychiatric: Normal affect, non-agitated, not confused       LABS:  CMP Latest Ref Rng & Units 10/30/2020 10/29/2020 10/23/2020  Glucose 70 - 99 mg/dL 10/25/2020) 527(P) 824(M)  BUN 6 - 20 mg/dL 20 18 16   Creatinine 0.61 - 1.24 mg/dL 353(I 1.44  Sodium 135 - 145 mmol/L 138 135 136  Potassium 3.5 - 5.1 mmol/L 3.8 3.8 4.0  Chloride 98 - 111  mmol/L 99 95(L) 98  CO2 22 - 32 mmol/L 35(H) 33(H) 31  Calcium 8.9 - 10.3 mg/dL 3.15) 8.3(L) 7.9(L)  Total Protein 6.5 - 8.1 g/dL - - -  Total Bilirubin 0.3 - 1.2 mg/dL - - -  Alkaline Phos 38 - 126 U/L - - -  AST 15 - 41 U/L - - -  ALT 0 - 44 U/L - - -   CBC Latest Ref Rng & Units 10/30/2020 10/29/2020 10/23/2020  WBC 4.0 - 10.5 K/uL 7.9 7.4 9.7  Hemoglobin 13.0 - 17.0 g/dL 11.9(L) 11.5(L) 12.1(L)  Hematocrit 39.0 - 52.0 % 37.7(L) 35.7(L) 38.5(L)  Platelets 150 - 400 K/uL 441(H) 390 409(H)    RADS: N/a Assessment:   S/p cecal perforation repair.  Now with old hematoma evacuation and open midline wound.  Wound continues to look clean with partial closure of the old hematoma cavity already based on rough measurements with CTA.  Continue wet-to-dry dressing changes on a daily basis.  Surgery would reexamine the wound sometime next week if patient is still in-house.  Call with any additional questions or concerns

## 2020-10-30 NOTE — Progress Notes (Signed)
PROGRESS NOTE    Christian [name]  UTM:[id number] DOB: [birth year] DOA: 10/09/2020 PCP: Pcp, No   Brief Narrative:  45 y.o. male with medical history significant for morbid obesity, hypertension presents to the emergency department via EMS for chief concerns of bilateral lower abdominal pain.   He reports that the pain is in the bilateral lower abdominal region, described as sharp, 5/10 and is now improved to a 4/10. He vomited twice today after eating. He denies fever, chest pain, dysuria. He endorses feeling winded when getting from the ambulance to the ED bed. He could still breath.  He does endorse diarrhea that is brown liquidy stool.  He soiled himself because he states he was feeling so weak from the abdominal pain, he could not get up out of his car.  Initial diagnosis was abdominal wall purulent cellulitis.  Patient with increased work of breathing noted 8/18.  Chest x-ray with free air under diaphragm.  CT with pneumoperitoneum.  Status post diagnostic laparoscopy and repair of the cecal perforation.  Patient is hemodynamically stable however we are lacking safe discharge plan at this time.  Patient's friend discussed with CSW.  He is unable to let patient stay with him.  Patient's car is apparently filled with trash, roaches, bedbugs and is likely not safe for patient to return to.  No clear safe disposition plan   Assessment & Plan:   Principal Problem:   Severe sepsis (HCC) Active Problems:   Morbid obesity (HCC)   Hyperglycemia   AKI (acute kidney injury) (HCC)   Pressure ulcer of back   Pressure ulcer of buttock   Essential hypertension   Cellulitis of abdominal wall   Impaired fasting glucose   Acute respiratory failure with hypoxia (HCC)   Weakness   Acute on chronic respiratory failure with hypoxia and hypercapnia (HCC)   Bowel perforation (HCC)   Hypotension   Obesity hypoventilation syndrome (HCC)  Acute on chronic hypoxic and hypercarbic respiratory  failure Likely due to undiagnosed obstructive sleep apnea/obesity hypoventilation Mentating clearly On BiPAP at night, nasal cannula during the day Plan: Continue nasal cannula Wean as tolerated Nightly BiPAP Incentive spirometry and therapy evaluations, exert and ambulate as tolerated Continue Lasix 40 mg daily Continue to wean oxygen as tolerated Disposition major problem.  Patient on nasal cannula during the day and BiPAP at night.   Pneumoperitoneum with cecal perforation Postoperative day 11 Tolerating diet with documented bowel movement Completed course of antibiotics Plan: 8/31 General surgery: midline staples removed and very large amount of liquified hematoma evacuated at bedside.  Portion of wound opened to facilitate as much drainage as possible.  Hematoma cavity extend along entire staple line, but adequate drainage seems to have been achieved through the current opening at the lower portion.  Wound dressed with wet to dry.  Will reassess again in a couple days.  JP removed without any issues.   Severe sepsis secondary to abdominal wall and bilateral lower extremity cellulitis Also presented with AKI, now resolved Completed course of antibiotics Continue with wound care  Morbid obesity With suspected associated sleep apnea This complicates overall care and prognosis  Acute kidney injury Resolved  Weakness Functional decline Therapy recommending rehab but patient is uninsured and we are lacking safe disposition plan.  Patient also lives in his car  Iron deficiency, iron saturation 11%, started oral iron supplement with vitamin C Vitamin B12 level 331, target >400, started vitamin B12 1000 mcg IM injection daily for 7 days and then start  oral supplement. Vitamin D level 32, started oral supplement.   DVT prophylaxis: SQ Lovenox Code Status: Full Family Communication: Girlfriend at bedside 8/27 Disposition Plan: Status is: Inpatient  Remains inpatient  appropriate because:Inpatient level of care appropriate due to severity of illness  Dispo: The patient is from: Home              Anticipated d/c is to:  TBD              Patient currently is medically stable to d/c.   Difficult to place patient Yes   Patient is essentially medically ready for discharge at this time.  We are currently lacking safe disposition plan.  Patient is on NIPPV at night and dependent nasal cannula during the day.  Patient is uninsured and disposition is quite a challenge.  He is also homeless and lives in his car.  Apparently the car is not safe to return to.   Level of care: Med-Surg  Consultants:  General surgery  Procedures:  Exploratory laparotomy  Antimicrobials:  None   Subjective: No overnight significant issues, patient was sitting in the recliner, still has mild soreness in the belly but no any other active issues.  Patient did walk with physical therapy. Patient stated that he spoke to her sister who can provide hotel for 7 days but cannot helper for long-term. Patient is still not medically stable for discharge.  We will plan for disposition next week once patient is more physically strong  Objective: Vitals:   10/29/20 2334 10/30/20 0500 10/30/20 0813 10/30/20 1526  BP:  130/72 125/76 128/80  Pulse:  88 81 90  Resp: (!) 26 14 18 18   Temp:  98.3 F (36.8 C) 98.1 F (36.7 C) 97.7 F (36.5 C)  TempSrc:  Oral Oral Oral  SpO2:  96%  94%  Weight:      Height:        Intake/Output Summary (Last 24 hours) at 10/30/2020 1756 Last data filed at 10/30/2020 1500 Gross per 24 hour  Intake 957 ml  Output 1175 ml  Net -218 ml   Filed Weights   10/09/20 0807 10/15/20 1600  Weight: (!) 197.3 kg (!) 201.6 kg    Examination:  General exam: No acute distress.  Appears chronically ill Respiratory system: Lung sounds decreased at bases.  Bibasilar rhonchi.  Normal work of breathing.  2 L Cardiovascular system: Distant heart sounds, S1-S2, regular  rate and rhythm, no murmurs, nonpitting peripheral edema bilateral lower extremities Gastrointestinal system: Obese, mildly distended abdomen, midline incision dressing slightly soaked with dried blood, no active discharge.  Mild generalized tenderness.  Central nervous system: Alert and oriented. No focal neurological deficits. Extremities: Symmetric 5 x 5 power. Skin: Bilateral buttock wounds Psychiatry: Judgement and insight appear normal. Mood & affect appropriate.     Data Reviewed: I have personally reviewed following labs and imaging studies  CBC: Recent Labs  Lab 10/29/20 0624 10/30/20 0617  WBC 7.4 7.9  HGB 11.5* 11.9*  HCT 35.7* 37.7*  MCV 85.4 84.0  PLT 390 441*   Basic Metabolic Panel: Recent Labs  Lab 10/29/20 0624 10/30/20 0617  NA 135 138  K 3.8 3.8  CL 95* 99  CO2 33* 35*  GLUCOSE 111* 115*  BUN 18 20  CREATININE 0.81 0.80  CALCIUM 8.3* 8.4*  MG 2.1 2.4  PHOS 4.5 5.1*   GFR: Estimated Creatinine Clearance: 214.3 mL/min (by C-G formula based on SCr of 0.8 mg/dL). Liver Function Tests: No  results for input(s): AST, ALT, ALKPHOS, BILITOT, PROT, ALBUMIN in the last 168 hours. No results for input(s): LIPASE, AMYLASE in the last 168 hours. No results for input(s): AMMONIA in the last 168 hours. Coagulation Profile: No results for input(s): INR, PROTIME in the last 168 hours. Cardiac Enzymes: No results for input(s): CKTOTAL, CKMB, CKMBINDEX, TROPONINI in the last 168 hours. BNP (last 3 results) No results for input(s): PROBNP in the last 8760 hours. HbA1C: No results for input(s): HGBA1C in the last 72 hours. CBG: Recent Labs  Lab 10/29/20 2037  GLUCAP 142*    Lipid Profile: No results for input(s): CHOL, HDL, LDLCALC, TRIG, CHOLHDL, LDLDIRECT in the last 72 hours. Thyroid Function Tests: No results for input(s): TSH, T4TOTAL, FREET4, T3FREE, THYROIDAB in the last 72 hours. Anemia Panel: Recent Labs    10/28/20 1018  VITAMINB12 331   FOLATE 7.1  TIBC 238*  IRON 25*   Sepsis Labs: No results for input(s): PROCALCITON, LATICACIDVEN in the last 168 hours.  No results found for this or any previous visit (from the past 240 hour(s)).        Radiology Studies: No results found.      Scheduled Meds:  vitamin C  500 mg Oral Daily   collagenase   Topical Daily   cyanocobalamin  1,000 mcg Intramuscular Daily   enoxaparin (LOVENOX) injection  100 mg Subcutaneous Q24H   feeding supplement  237 mL Oral TID BM   furosemide  40 mg Oral Daily   iron polysaccharides  150 mg Oral Daily   multivitamin with minerals  1 tablet Oral Daily   nutrition supplement (JUVEN)  1 packet Oral BID BM   potassium chloride  20 mEq Oral BID   [START ON 11/05/2020] vitamin B-12  100 mcg Oral Daily   Vitamin D (Ergocalciferol)  50,000 Units Oral Q7 days   Continuous Infusions:   LOS: 20 days    Time spent: 15 minutes    Gillis Santa, MD [facility] Pager [phone]  If 7PM-7AM, please contact night-coverage 10/30/2020, 5:56 PM

## 2020-10-30 NOTE — Evaluation (Signed)
Occupational Therapy Re-Evaluation Patient Details Name: Christian [name] MRN: [id number] DOB: April 15, 1975 Today's Date: 10/30/2020    History of Present Illness Pt is a 45 y/o M who presented to ED on 10/09/20 with c/c of bilateral lower abdominal pain & c/o emesis twice that day after eating. Pt found to have abdominal wall purulent cellulitis. Pt admitted with clinical sepsis & AKI. PMH: morbid obesity, HTN. Pt with increased SOB starting 8/18 and CXR ordered with concern for free air. CT showed pneumoperitoneum suspicious for bowel perforation. Pt now s/p diagnostic laparoscopy converted to exploratory laparotomy, primary repair of cecal perforation and t/f to ICU on 8/19.   Clinical Impression   Christian [name] was seen for OT re-evaluation on this date, some goals achieved and updated. Upon arrival to room pt reclined in bed, eager for ADLs. Pt requires assist to exit flat bed and heavy use of rails. MIN A + metal bariatric RW for BSC t/f - assist to stabilize RW standing from bed, no physical assist standing from BSC height. MOD A for perihygeine in standing using washcloths - fair balance c single UE support however continues to require assist for thoroughness. Pt making good progress toward goals. Pt continues to benefit from skilled OT services to maximize return to PLOF and minimize risk of future falls, injury, caregiver burden, and readmission. Will continue to follow POC. Discharge recommendation remains appropriate.      Follow Up Recommendations  SNF;Supervision/Assistance - 24 hour    Equipment Recommendations  Other (comment) (defer)    Recommendations for Other Services       Precautions / Restrictions Precautions Precautions: Fall Restrictions Weight Bearing Restrictions: No      Mobility Bed Mobility Overal bed mobility: Needs Assistance Bed Mobility: Supine to Sit Rolling: Min assist   Supine to sit: Min assist          Transfers Overall transfer level: Needs  assistance Equipment used: Rolling walker (2 wheeled) Transfers: Sit to/from Stand;Lateral/Scoot Transfers Sit to Stand: Min assist        Lateral/Scoot Transfers: Min guard General transfer comment: MIN A to stabilize RW standing from bed height    Balance Overall balance assessment: Needs assistance Sitting-balance support: No upper extremity supported;Feet supported Sitting balance-[name]: Good     Standing balance support: Single extremity supported;During functional activity Standing balance-[name]: Fair                             ADL either performed or assessed with clinical judgement   ADL Overall ADL's : Needs assistance/impaired                                       General ADL Comments: MIN A + metal bariatric RW for BSC t/f - assist to stabilize RW standing from bed, no assist standing from BSC height. MOD A for perihygeine in standing using washcloths - fair balance c single UE support however continues to require assist for thoroughness.      Pertinent Vitals/Pain Pain Assessment: No/denies pain           Communication     Cognition Arousal/Alertness: Awake/alert Behavior During Therapy: WFL for tasks assessed/performed Overall Cognitive Status: Within Functional Limits for tasks assessed  General Comments       Exercises Exercises: Other exercises Other Exercises Other Exercises: Pt educated re:HEP, adapted toileting strategies Other Exercises: toileting, bathing, lateral scoot, sup>sit, sit<>stand x4, sitting/standing balance/tolerance, ~10 ft mobility                   OT Goals(Current goals can be found in the care plan section) Acute Rehab OT Goals Patient Stated Goal: be able to return to work again OT Goal Formulation: With patient Time For Goal Achievement: 11/13/20 Potential to Achieve Goals: Good ADL Goals Pt Will Perform Upper Body  Dressing: with supervision;standing Pt Will Perform Lower Body Dressing: with min assist;with adaptive equipment;sit to/from stand Pt Will Transfer to Toilet: with modified independence;ambulating;bedside commode Pt/caregiver will Perform Home Exercise Program: Both right and left upper extremity;With theraband;With written HEP provided;Independently;Increased strength  OT Frequency: Min 2X/week    AM-PAC OT "6 Clicks" Daily Activity     Outcome Measure Help from another person eating meals?: None Help from another person taking care of personal grooming?: A Little Help from another person toileting, which includes using toliet, bedpan, or urinal?: A Lot Help from another person bathing (including washing, rinsing, drying)?: A Lot Help from another person to put on and taking off regular upper body clothing?: A Little Help from another person to put on and taking off regular lower body clothing?: A Lot 6 Click Score: 16   End of Session Equipment Utilized During Treatment: Oxygen;Rolling walker  Activity Tolerance: Patient tolerated treatment well Patient left: in chair;with call bell/phone within reach;with nursing/sitter in room  OT Visit Diagnosis: Unsteadiness on feet (R26.81);Muscle weakness (generalized) (M62.81)                Time: 1030-1058 OT Time Calculation (min): 28 min Charges:  OT General Charges $OT Visit: 1 Visit OT Evaluation $OT Re-eval: 1 Re-eval OT Treatments $Self Care/Home Management : 23-37 mins  Kathie Dike, M.S. OTR/L  10/30/20, 1:34 PM  ascom [phone]

## 2020-10-31 LAB — CBC
HCT: 38.9 % — ABNORMAL LOW (ref 39.0–52.0)
Hemoglobin: 12.1 g/dL — ABNORMAL LOW (ref 13.0–17.0)
MCH: 25.9 pg — ABNORMAL LOW (ref 26.0–34.0)
MCHC: 31.1 g/dL (ref 30.0–36.0)
MCV: 83.3 fL (ref 80.0–100.0)
Platelets: 422 10*3/uL — ABNORMAL HIGH (ref 150–400)
RBC: 4.67 MIL/uL (ref 4.22–5.81)
RDW: 14.5 % (ref 11.5–15.5)
WBC: 8.1 10*3/uL (ref 4.0–10.5)
nRBC: 0 % (ref 0.0–0.2)

## 2020-10-31 LAB — BASIC METABOLIC PANEL
Anion gap: 5 (ref 5–15)
BUN: 21 mg/dL — ABNORMAL HIGH (ref 6–20)
CO2: 34 mmol/L — ABNORMAL HIGH (ref 22–32)
Calcium: 8.4 mg/dL — ABNORMAL LOW (ref 8.9–10.3)
Chloride: 102 mmol/L (ref 98–111)
Creatinine, Ser: 0.93 mg/dL (ref 0.61–1.24)
GFR, Estimated: >60 mL/min (ref >60)
Glucose, Bld: 112 mg/dL — ABNORMAL HIGH (ref 70–99)
Potassium: 4 mmol/L (ref 3.5–5.1)
Sodium: 141 mmol/L (ref 135–145)

## 2020-10-31 LAB — PHOSPHORUS: Phosphorus: 5.1 mg/dL — ABNORMAL HIGH (ref 2.5–4.6)

## 2020-10-31 LAB — MAGNESIUM: Magnesium: 2.3 mg/dL (ref 1.7–2.4)

## 2020-10-31 MED ORDER — HYDROCERIN EX CREA
TOPICAL_CREAM | Freq: Two times a day (BID) | CUTANEOUS | Status: DC
  Administered 2020-11-02 – 2020-11-23 (×8): 1 via TOPICAL
  Filled 2020-10-31 (×3): qty 113

## 2020-10-31 MED ORDER — FOLIC ACID 1 MG PO TABS
1.0000 mg | ORAL_TABLET | Freq: Every day | ORAL | Status: DC
  Administered 2020-10-31 – 2020-11-30 (×31): 1 mg via ORAL
  Filled 2020-10-31 (×31): qty 1

## 2020-10-31 NOTE — Progress Notes (Signed)
PROGRESS NOTE    Christian [name]  UTM:[id number] DOB: [birth year] DOA: 10/09/2020 PCP: Pcp, No   Brief Narrative:  45 y.o. male with medical history significant for morbid obesity, hypertension presents to the emergency department via EMS for chief concerns of bilateral lower abdominal pain.   He reports that the pain is in the bilateral lower abdominal region, described as sharp, 5/10 and is now improved to a 4/10. He vomited twice today after eating. He denies fever, chest pain, dysuria. He endorses feeling winded when getting from the ambulance to the ED bed. He could still breath.  He does endorse diarrhea that is brown liquidy stool.  He soiled himself because he states he was feeling so weak from the abdominal pain, he could not get up out of his car.  Initial diagnosis was abdominal wall purulent cellulitis.  Patient with increased work of breathing noted 8/18.  Chest x-ray with free air under diaphragm.  CT with pneumoperitoneum.  Status post diagnostic laparoscopy and repair of the cecal perforation.  Patient is hemodynamically stable however we are lacking safe discharge plan at this time.  Patient's friend discussed with CSW.  He is unable to let patient stay with him.  Patient's car is apparently filled with trash, roaches, bedbugs and is likely not safe for patient to return to.  No clear safe disposition plan   Assessment & Plan:   Principal Problem:   Severe sepsis (HCC) Active Problems:   Morbid obesity (HCC)   Hyperglycemia   AKI (acute kidney injury) (HCC)   Pressure ulcer of back   Pressure ulcer of buttock   Essential hypertension   Cellulitis of abdominal wall   Impaired fasting glucose   Acute respiratory failure with hypoxia (HCC)   Weakness   Acute on chronic respiratory failure with hypoxia and hypercapnia (HCC)   Bowel perforation (HCC)   Hypotension   Obesity hypoventilation syndrome (HCC)  Acute on chronic hypoxic and hypercarbic respiratory  failure Likely due to undiagnosed obstructive sleep apnea/obesity hypoventilation Mentating clearly On BiPAP at night, nasal cannula during the day Plan: Continue nasal cannula Wean as tolerated Nightly BiPAP Incentive spirometry and therapy evaluations, exert and ambulate as tolerated Continue Lasix 40 mg daily Continue to wean oxygen as tolerated Disposition major problem.  Patient on nasal cannula during the day and BiPAP at night.   Pneumoperitoneum with cecal perforation Postoperative day 11 Tolerating diet with documented bowel movement Completed course of antibiotics Plan: 8/31 General surgery: midline staples removed and very large amount of liquified hematoma evacuated at bedside.  Portion of wound opened to facilitate as much drainage as possible.  Hematoma cavity extend along entire staple line, but adequate drainage seems to have been achieved through the current opening at the lower portion.  Wound dressed with wet to dry.  Will reassess again in a couple days.  JP removed without any issues.   Severe sepsis secondary to abdominal wall and bilateral lower extremity cellulitis Also presented with AKI, now resolved Completed course of antibiotics Continue with wound care  Morbid obesity With suspected associated sleep apnea This complicates overall care and prognosis  Acute kidney injury Resolved  Weakness Functional decline Therapy recommending rehab but patient is uninsured and we are lacking safe disposition plan.  Patient also lives in his car  Iron deficiency, iron saturation 11%, started oral iron supplement with vitamin C Vitamin B12 level 331, target >400, started vitamin B12 1000 mcg IM injection daily for 7 days and then start  oral supplement. Vitamin D level 32, started oral supplement.   DVT prophylaxis: SQ Lovenox Code Status: Full Family Communication: Girlfriend at bedside 8/27 Disposition Plan: Status is: Inpatient  Remains inpatient  appropriate because:Inpatient level of care appropriate due to severity of illness  Dispo: The patient is from: Home              Anticipated d/c is to:  TBD              Patient currently is medically stable to d/c.   Difficult to place patient Yes   Patient is essentially medically ready for discharge at this time.  We are currently lacking safe disposition plan.  Patient is on NIPPV at night and dependent nasal cannula during the day.  Patient is uninsured and disposition is quite a challenge.  He is also homeless and lives in his car.  Apparently the car is not safe to return to.   Level of care: Med-Surg  Consultants:  General surgery  Procedures:  Exploratory laparotomy  Antimicrobials:  None   Subjective: No overnight significant issues, patient was sitting in the recliner, still has mild soreness in the belly but no any other active issues.  Patient did walk with physical therapy. Patient stated that we are thinking to discharge him next week but is still he has no place to go.  Objective: Vitals:   10/30/20 1526 10/30/20 2036 10/31/20 0505 10/31/20 0823  BP: 128/80 129/78 124/81 125/89  Pulse: 90 91 79 77  Resp: 18 20 20 18   Temp: 97.7 F (36.5 C) 98.3 F (36.8 C) 98.2 F (36.8 C) (!) 97.5 F (36.4 C)  TempSrc: Oral  Oral Oral  SpO2: 94% 95% 100% 99%  Weight:      Height:        Intake/Output Summary (Last 24 hours) at 10/31/2020 1345 Last data filed at 10/31/2020 1044 Gross per 24 hour  Intake 480 ml  Output 450 ml  Net 30 ml   Filed Weights   10/09/20 0807 10/15/20 1600  Weight: (!) 197.3 kg (!) 201.6 kg    Examination:  General exam: No acute distress.  Appears chronically ill Respiratory system: Lung sounds decreased at bases.  Bibasilar rhonchi.  Normal work of breathing.  2 L Cardiovascular system: Distant heart sounds, S1-S2, regular rate and rhythm, no murmurs, nonpitting peripheral edema bilateral lower extremities Gastrointestinal system:  Obese, mildly distended abdomen, midline incision dressing slightly soaked with dried blood, no active discharge.  Mild generalized tenderness.  Central nervous system: Alert and oriented. No focal neurological deficits. Extremities: Symmetric 5 x 5 power. Skin: Bilateral buttock wounds Psychiatry: Judgement and insight appear normal. Mood & affect appropriate.     Data Reviewed: I have personally reviewed following labs and imaging studies  CBC: Recent Labs  Lab 10/29/20 0624 10/30/20 0617 10/31/20 0520  WBC 7.4 7.9 8.1  HGB 11.5* 11.9* 12.1*  HCT 35.7* 37.7* 38.9*  MCV 85.4 84.0 83.3  PLT 390 441* 422*   Basic Metabolic Panel: Recent Labs  Lab 10/29/20 0624 10/30/20 0617 10/31/20 0520  NA 135 138 141  K 3.8 3.8 4.0  CL 95* 99 102  CO2 33* 35* 34*  GLUCOSE 111* 115* 112*  BUN 18 20 21*  CREATININE 0.81 0.80 0.93  CALCIUM 8.3* 8.4* 8.4*  MG 2.1 2.4 2.3  PHOS 4.5 5.1* 5.1*   GFR: Estimated Creatinine Clearance: 184.3 mL/min (by C-G formula based on SCr of 0.93 mg/dL). Liver Function Tests: No  results for input(s): AST, ALT, ALKPHOS, BILITOT, PROT, ALBUMIN in the last 168 hours. No results for input(s): LIPASE, AMYLASE in the last 168 hours. No results for input(s): AMMONIA in the last 168 hours. Coagulation Profile: No results for input(s): INR, PROTIME in the last 168 hours. Cardiac Enzymes: No results for input(s): CKTOTAL, CKMB, CKMBINDEX, TROPONINI in the last 168 hours. BNP (last 3 results) No results for input(s): PROBNP in the last 8760 hours. HbA1C: No results for input(s): HGBA1C in the last 72 hours. CBG: Recent Labs  Lab 10/29/20 2037  GLUCAP 142*    Lipid Profile: No results for input(s): CHOL, HDL, LDLCALC, TRIG, CHOLHDL, LDLDIRECT in the last 72 hours. Thyroid Function Tests: No results for input(s): TSH, T4TOTAL, FREET4, T3FREE, THYROIDAB in the last 72 hours. Anemia Panel: No results for input(s): VITAMINB12, FOLATE, FERRITIN, TIBC,  IRON, RETICCTPCT in the last 72 hours.  Sepsis Labs: No results for input(s): PROCALCITON, LATICACIDVEN in the last 168 hours.  No results found for this or any previous visit (from the past 240 hour(s)).        Radiology Studies: No results found.      Scheduled Meds:  vitamin C  500 mg Oral Daily   collagenase   Topical Daily   cyanocobalamin  1,000 mcg Intramuscular Daily   enoxaparin (LOVENOX) injection  100 mg Subcutaneous Q24H   feeding supplement  237 mL Oral TID BM   folic acid  1 mg Oral Daily   furosemide  40 mg Oral Daily   iron polysaccharides  150 mg Oral Daily   multivitamin with minerals  1 tablet Oral Daily   nutrition supplement (JUVEN)  1 packet Oral BID BM   potassium chloride  20 mEq Oral BID   [START ON 11/05/2020] vitamin B-12  100 mcg Oral Daily   Vitamin D (Ergocalciferol)  50,000 Units Oral Q7 days   Continuous Infusions:   LOS: 21 days    Time spent: 15 minutes    Gillis Santa, MD [facility] Pager [phone]  If 7PM-7AM, please contact night-coverage 10/31/2020, 1:45 PM

## 2020-10-31 NOTE — Progress Notes (Addendum)
Mobility Specialist - Progress Note   10/31/20 1000  Mobility  Activity Transferred to/from [facility]  Level of Assistance Standby assist, set-up cues, supervision of patient - no hands on  Assistive Device Front wheel walker  Distance Ambulated (ft) 3 ft  Mobility Out of bed for toileting  Mobility Response Tolerated well  Mobility performed by Mobility specialist  $Mobility charge 1 Mobility    Pt transferred bed-BSC for BM. RW and +2 assist from RN.   Filiberto Pinks Mobility Specialist 10/31/20, 10:47 AM

## 2020-10-31 NOTE — Plan of Care (Signed)

## 2020-10-31 NOTE — Progress Notes (Signed)
Mobility Specialist - Progress Note   10/31/20 1045  Mobility  Activity Transferred:  Bed to chair  Level of Assistance Standby assist, set-up cues, supervision of patient - no hands on  Assistive Device Front wheel walker  Distance Ambulated (ft) 3 ft  Mobility Out of bed to chair with meals  Mobility Response Tolerated well  Mobility performed by Mobility specialist  $Mobility charge 1 Mobility    Pt transferred to recliner with RW and +2 assist from RN.    Filiberto Pinks Mobility Specialist 10/31/20, 10:46 AM

## 2020-11-01 LAB — CBC
HCT: 38.9 % — ABNORMAL LOW (ref 39.0–52.0)
Hemoglobin: 12 g/dL — ABNORMAL LOW (ref 13.0–17.0)
MCH: 26 pg (ref 26.0–34.0)
MCHC: 30.8 g/dL (ref 30.0–36.0)
MCV: 84.2 fL (ref 80.0–100.0)
Platelets: 394 10*3/uL (ref 150–400)
RBC: 4.62 MIL/uL (ref 4.22–5.81)
RDW: 14.6 % (ref 11.5–15.5)
WBC: 8.3 10*3/uL (ref 4.0–10.5)
nRBC: 0 % (ref 0.0–0.2)

## 2020-11-01 LAB — BASIC METABOLIC PANEL
Anion gap: 7 (ref 5–15)
BUN: 18 mg/dL (ref 6–20)
CO2: 32 mmol/L (ref 22–32)
Calcium: 8.6 mg/dL — ABNORMAL LOW (ref 8.9–10.3)
Chloride: 99 mmol/L (ref 98–111)
Creatinine, Ser: 0.89 mg/dL (ref 0.61–1.24)
GFR, Estimated: >60 mL/min (ref >60)
Glucose, Bld: 100 mg/dL — ABNORMAL HIGH (ref 70–99)
Potassium: 4.5 mmol/L (ref 3.5–5.1)
Sodium: 138 mmol/L (ref 135–145)

## 2020-11-01 NOTE — Progress Notes (Signed)
PROGRESS NOTE    Christian [name]  HAL:[id number] DOB: 10/28/1975 DOA: 10/09/2020 PCP: Pcp, No   Brief Narrative:  45 y.o. male with medical history significant for morbid obesity, hypertension presents to the emergency department via EMS for chief concerns of bilateral lower abdominal pain.   He reports that the pain is in the bilateral lower abdominal region, described as sharp, 5/10 and is now improved to a 4/10. He vomited twice today after eating. He denies fever, chest pain, dysuria. He endorses feeling winded when getting from the ambulance to the ED bed. He could still breath.  He does endorse diarrhea that is brown liquidy stool.  He soiled himself because he states he was feeling so weak from the abdominal pain, he could not get up out of his car.  Initial diagnosis was abdominal wall purulent cellulitis.  Patient with increased work of breathing noted 8/18.  Chest x-ray with free air under diaphragm.  CT with pneumoperitoneum.  Status post diagnostic laparoscopy and repair of the cecal perforation.  Patient is hemodynamically stable however we are lacking safe discharge plan at this time.  Patient's friend discussed with CSW.  He is unable to let patient stay with him.  Patient's car is apparently filled with trash, roaches, bedbugs and is likely not safe for patient to return to.  No clear safe disposition plan   Assessment & Plan:   Principal Problem:   Severe sepsis (HCC) Active Problems:   Morbid obesity (HCC)   Hyperglycemia   AKI (acute kidney injury) (HCC)   Pressure ulcer of back   Pressure ulcer of buttock   Essential hypertension   Cellulitis of abdominal wall   Impaired fasting glucose   Acute respiratory failure with hypoxia (HCC)   Weakness   Acute on chronic respiratory failure with hypoxia and hypercapnia (HCC)   Bowel perforation (HCC)   Hypotension   Obesity hypoventilation syndrome (HCC)  Acute on chronic hypoxic and hypercarbic respiratory  failure Likely due to undiagnosed obstructive sleep apnea/obesity hypoventilation Mentating clearly On BiPAP at night, nasal cannula during the day Plan: Continue nasal cannula Wean as tolerated Nightly BiPAP Incentive spirometry and therapy evaluations, exert and ambulate as tolerated Continue Lasix 40 mg daily Continue to wean oxygen as tolerated Disposition major problem.  Patient on nasal cannula during the day and BiPAP at night.   Pneumoperitoneum with cecal perforation Postoperative day 11 Tolerating diet with documented bowel movement Completed course of antibiotics Plan: 8/31 General surgery: midline staples removed and very large amount of liquified hematoma evacuated at bedside.  Portion of wound opened to facilitate as much drainage as possible.  Hematoma cavity extend along entire staple line, but adequate drainage seems to have been achieved through the current opening at the lower portion.  Wound dressed with wet to dry.  Will reassess again in a couple days.  JP removed without any issues.   Severe sepsis secondary to abdominal wall and bilateral lower extremity cellulitis Also presented with AKI, now resolved Completed course of antibiotics Continue with wound care  Morbid obesity With suspected associated sleep apnea This complicates overall care and prognosis  Acute kidney injury Resolved  Weakness Functional decline Therapy recommending rehab but patient is uninsured and we are lacking safe disposition plan.  Patient also lives in his car  Iron deficiency, iron saturation 11%, started oral iron supplement with vitamin C Vitamin B12 level 331, target >400, started vitamin B12 1000 mcg IM injection daily for 7 days and then start  oral supplement. Vitamin D level 32, started oral supplement.   DVT prophylaxis: SQ Lovenox Code Status: Full Family Communication: Girlfriend at bedside 8/27 Disposition Plan: Status is: Inpatient  Remains inpatient  appropriate because:Inpatient level of care appropriate due to severity of illness  Dispo: The patient is from: Home              Anticipated d/c is to:  TBD              Patient currently is medically stable to d/c.   Difficult to place patient Yes   Patient is essentially medically ready for discharge at this time.  We are currently lacking safe disposition plan.  Patient is on NIPPV at night and dependent nasal cannula during the day.  Patient is uninsured and disposition is quite a challenge.  He is also homeless and lives in his car.  Apparently the car is not safe to return to.   Level of care: Med-Surg  Consultants:  General surgery  Procedures:  Exploratory laparotomy  Antimicrobials:  None   Subjective: No overnight significant issues, patient just woke up and he was lying comfortably in the bed, denied any active issues, no significant abdominal pain, no shortness of breath, no chest pain or palpitations.  Patient is still does not have any place to go anywhere so we will continue to work on the placement   Objective: Vitals:   10/31/20 2211 11/01/20 0419 11/01/20 0545 11/01/20 0759  BP: 132/78 126/73  129/80  Pulse: 90 75  75  Resp: 19 18  18   Temp: 98.2 F (36.8 C) 98.1 F (36.7 C)  97.8 F (36.6 C)  TempSrc: Oral Oral  Oral  SpO2: 98% 100% 98% 100%  Weight:      Height:        Intake/Output Summary (Last 24 hours) at 11/01/2020 1321 Last data filed at 10/31/2020 2137 Gross per 24 hour  Intake --  Output 651 ml  Net -651 ml   Filed Weights   10/09/20 0807 10/15/20 1600  Weight: (!) 197.3 kg (!) 201.6 kg    Examination:  General exam: No acute distress.  Appears chronically ill Respiratory system: Lung sounds decreased at bases.  Bibasilar rhonchi.  Normal work of breathing.  2 L Cardiovascular system: Distant heart sounds, S1-S2, regular rate and rhythm, no murmurs, nonpitting peripheral edema bilateral lower extremities Gastrointestinal system:  Obese, mildly distended abdomen, midline incision dressing slightly soaked with dried blood, no active discharge.  Mild generalized tenderness.  Central nervous system: Alert and oriented. No focal neurological deficits. Extremities: Symmetric 5 x 5 power. Skin: Bilateral buttock wounds Psychiatry: Judgement and insight appear normal. Mood & affect appropriate.     Data Reviewed: I have personally reviewed following labs and imaging studies  CBC: Recent Labs  Lab 10/29/20 0624 10/30/20 0617 10/31/20 0520 11/01/20 0509  WBC 7.4 7.9 8.1 8.3  HGB 11.5* 11.9* 12.1* 12.0*  HCT 35.7* 37.7* 38.9* 38.9*  MCV 85.4 84.0 83.3 84.2  PLT 390 441* 422* 394   Basic Metabolic Panel: Recent Labs  Lab 10/29/20 0624 10/30/20 0617 10/31/20 0520 11/01/20 0509  NA 135 138 141 138  K 3.8 3.8 4.0 4.5  CL 95* 99 102 99  CO2 33* 35* 34* 32  GLUCOSE 111* 115* 112* 100*  BUN 18 20 21* 18  CREATININE 0.81 0.80 0.93 0.89  CALCIUM 8.3* 8.4* 8.4* 8.6*  MG 2.1 2.4 2.3  --   PHOS 4.5 5.1* 5.1*  --  GFR: Estimated Creatinine Clearance: 192.6 mL/min (by C-G formula based on SCr of 0.89 mg/dL). Liver Function Tests: No results for input(s): AST, ALT, ALKPHOS, BILITOT, PROT, ALBUMIN in the last 168 hours. No results for input(s): LIPASE, AMYLASE in the last 168 hours. No results for input(s): AMMONIA in the last 168 hours. Coagulation Profile: No results for input(s): INR, PROTIME in the last 168 hours. Cardiac Enzymes: No results for input(s): CKTOTAL, CKMB, CKMBINDEX, TROPONINI in the last 168 hours. BNP (last 3 results) No results for input(s): PROBNP in the last 8760 hours. HbA1C: No results for input(s): HGBA1C in the last 72 hours. CBG: Recent Labs  Lab 10/29/20 2037  GLUCAP 142*    Lipid Profile: No results for input(s): CHOL, HDL, LDLCALC, TRIG, CHOLHDL, LDLDIRECT in the last 72 hours. Thyroid Function Tests: No results for input(s): TSH, T4TOTAL, FREET4, T3FREE, THYROIDAB in the  last 72 hours. Anemia Panel: No results for input(s): VITAMINB12, FOLATE, FERRITIN, TIBC, IRON, RETICCTPCT in the last 72 hours.  Sepsis Labs: No results for input(s): PROCALCITON, LATICACIDVEN in the last 168 hours.  No results found for this or any previous visit (from the past 240 hour(s)).        Radiology Studies: No results found.      Scheduled Meds:  vitamin C  500 mg Oral Daily   collagenase   Topical Daily   cyanocobalamin  1,000 mcg Intramuscular Daily   enoxaparin (LOVENOX) injection  100 mg Subcutaneous Q24H   feeding supplement  237 mL Oral TID BM   folic acid  1 mg Oral Daily   furosemide  40 mg Oral Daily   hydrocerin   Topical BID   iron polysaccharides  150 mg Oral Daily   multivitamin with minerals  1 tablet Oral Daily   nutrition supplement (JUVEN)  1 packet Oral BID BM   potassium chloride  20 mEq Oral BID   [START ON 11/05/2020] vitamin B-12  100 mcg Oral Daily   Vitamin D (Ergocalciferol)  50,000 Units Oral Q7 days   Continuous Infusions:   LOS: 22 days    Time spent: 15 minutes    Gillis Santa, MD [facility] Pager [phone]  If 7PM-7AM, please contact night-coverage 11/01/2020, 1:21 PM

## 2020-11-02 LAB — CBC
HCT: 36.4 % — ABNORMAL LOW (ref 39.0–52.0)
Hemoglobin: 11.5 g/dL — ABNORMAL LOW (ref 13.0–17.0)
MCH: 26.1 pg (ref 26.0–34.0)
MCHC: 31.6 g/dL (ref 30.0–36.0)
MCV: 82.5 fL (ref 80.0–100.0)
Platelets: 359 10*3/uL (ref 150–400)
RBC: 4.41 MIL/uL (ref 4.22–5.81)
RDW: 14.6 % (ref 11.5–15.5)
WBC: 7.5 10*3/uL (ref 4.0–10.5)
nRBC: 0 % (ref 0.0–0.2)

## 2020-11-02 LAB — BASIC METABOLIC PANEL
Anion gap: 7 (ref 5–15)
BUN: 17 mg/dL (ref 6–20)
CO2: 32 mmol/L (ref 22–32)
Calcium: 8.4 mg/dL — ABNORMAL LOW (ref 8.9–10.3)
Chloride: 99 mmol/L (ref 98–111)
Creatinine, Ser: 0.85 mg/dL (ref 0.61–1.24)
GFR, Estimated: >60 mL/min (ref >60)
Glucose, Bld: 108 mg/dL — ABNORMAL HIGH (ref 70–99)
Potassium: 3.9 mmol/L (ref 3.5–5.1)
Sodium: 138 mmol/L (ref 135–145)

## 2020-11-02 LAB — FERRITIN: Ferritin: 200 ng/mL (ref 24–336)

## 2020-11-02 NOTE — Plan of Care (Signed)

## 2020-11-02 NOTE — Progress Notes (Signed)
PROGRESS NOTE    Christian [name]  WEX:[id number] DOB: [birth year] DOA: 10/09/2020 PCP: Oneita Hurt, No   Brief Narrative: Christian [name] is a 45 y.o. male with a history of morbid obesity, hypertension.  Patient presents secondary to lower abdominal pain with evidence of abdominal wall cellulitis.  He is also found to have lower extremity cellulitis.  He was started empirically antibiotics with improvement.  While admitted, patient developed pneumoperitoneum with cecal perforation requiring surgical repair.  Patient was also found to be hypoxic and hypercapnic requiring BiPAP at night.  Patient is currently stable for discharge but discharge is complicated by poor social situation.   Assessment & Plan:   Principal Problem:   Severe sepsis (HCC) Active Problems:   Morbid obesity (HCC)   Hyperglycemia   AKI (acute kidney injury) (HCC)   Pressure ulcer of back   Pressure ulcer of buttock   Essential hypertension   Cellulitis of abdominal wall   Impaired fasting glucose   Acute respiratory failure with hypoxia (HCC)   Weakness   Acute on chronic respiratory failure with hypoxia and hypercapnia (HCC)   Bowel perforation (HCC)   Hypotension   Obesity hypoventilation syndrome (HCC)   Pneumoperitoneum with cecal perforation Diagnosed on CT scan from 8/18.  General surgery was consulted.  Started empirically on Zosyn IV.  Patient underwent diagnostic laparoscopy converted to open laparotomy on 8/18 with repair of bowel perforation.  Completed IV antibiotic course.  Currently stable.  Abdominal wall cellulitis Lower extremity cellulitis Patient started empirically on Vancomycin IV.  Now  Severe sepsis Secondary to cellulitis above. Blood cultures with no growth. Antibiotics as mentioned above.  Morbid obesity Body mass index is 55.55 kg/m.  AKI Peak creatinine of 1.43 with quick resolution with IV fluids. Resolved.  Acute respiratory failure with hypoxia and  hypercapnia Hypercapnia with pCO2 up to 81 on ABG from 8/19. Patient currently on BiPAP. No official diagnoses but possibly related to OSA/OHS -Continue BiPAP qhs  Weakness PT recommending SNF.  Normocytic anemia Mild anemia. Possibly related to iron deficiency although normocytic. Mild anemia prior to abdominal surgery, but has been more persistent since surgery. Currently stable.  Low iron Associated low TIBC and saturation ratio. No ferritin obtained. Patient started on supplemental iron -Check ferritin -Continue iron supplementation for now  Calcaneal spur Midfoot osteoarthritis Causing pain with ambulation. Would benefit from sports medicine or orthopedic surgery follow-up as an outpatient if able.  Pressure injury Left/right buttock, left/right heel, right foot, posterior/proximal right thigh.  All pressure injuries present on admission.  Poor social situation Patient is homeless and lives in his car. Ability to work limited by inability to ambulate well per patient. Patient without insurance.   DVT prophylaxis: Lovenox Code Status:   Code Status: Full Code Family Communication: None at bedside Disposition Plan: Discharge to SNF when safe discharge plan.   Consultants:  General surgery  Procedures:  DIAGNOSTIC LAPAROSCOPY CONVERTED TO OPEN LAPAROTOMY WITH REPAIR OF BOWEL PERFORATION  Antimicrobials: Vancomycin IV Zosyn IV Ceftriaxone IV  Fluconazole Cephalexin PO Anidulafungin IV   Subjective: Patient reports no issues overnight. Some foot pain with ambulation.  Objective: Vitals:   11/01/20 1522 11/01/20 2022 11/02/20 0019 11/02/20 0457  BP: 124/78 120/68  121/78  Pulse: 83 93 86 76  Resp: 12 18  18   Temp: 98.4 F (36.9 C) 97.7 F (36.5 C)  98.6 F (37 C)  TempSrc: Oral Oral    SpO2: 99% 96% 96%   Weight:  Height:        Intake/Output Summary (Last 24 hours) at 11/02/2020 0814 Last data filed at 11/01/2020 2045 Gross per 24 hour  Intake 240  ml  Output 851 ml  Net -611 ml   Filed Weights   10/09/20 0807 10/15/20 1600  Weight: (!) 197.3 kg (!) 201.6 kg    Examination:  General exam: Appears calm and comfortable Respiratory system: Clear to auscultation. Respiratory effort normal mostly but does speak in slightly shortened sentences Cardiovascular system: S1 & S2 heard, RRR. No murmurs, rubs, gallops or clicks. Gastrointestinal system: Abdomen is nondistended, soft and tender over incision/wound. No organomegaly or masses felt. Normal bowel sounds heard. Central nervous system: Alert and oriented. No focal neurological deficits. Musculoskeletal: LE edema. No calf tenderness Skin: No cyanosis. No rashes Psychiatry: Judgement and insight appear normal. Mood & affect appropriate.     Data Reviewed: I have personally reviewed following labs and imaging studies  CBC Lab Results  Component Value Date   WBC 7.5 11/02/2020   RBC 4.41 11/02/2020   HGB 11.5 (L) 11/02/2020   HCT 36.4 (L) 11/02/2020   MCV 82.5 11/02/2020   MCH 26.1 11/02/2020   PLT 359 11/02/2020   MCHC 31.6 11/02/2020   RDW 14.6 11/02/2020   LYMPHSABS 1.4 10/19/2020   MONOABS 1.0 10/19/2020   EOSABS 0.5 10/19/2020   BASOSABS 0.1 10/19/2020     Last metabolic panel Lab Results  Component Value Date   NA 138 11/02/2020   K 3.9 11/02/2020   CL 99 11/02/2020   CO2 32 11/02/2020   BUN 17 11/02/2020   CREATININE 0.85 11/02/2020   GLUCOSE 108 (H) 11/02/2020   GFRNONAA >60 11/02/2020   GFRAA >60 09/23/2019   CALCIUM 8.4 (L) 11/02/2020   PHOS 5.1 (H) 10/31/2020   PROT 7.0 10/09/2020   ALBUMIN 2.8 (L) 10/09/2020   BILITOT 1.5 (H) 10/09/2020   ALKPHOS 60 10/09/2020   AST 24 10/09/2020   ALT 24 10/09/2020   ANIONGAP 7 11/02/2020    CBG (last 3)  No results for input(s): GLUCAP in the last 72 hours.   GFR: Estimated Creatinine Clearance: 201.7 mL/min (by C-G formula based on SCr of 0.85 mg/dL).  Coagulation Profile: No results for input(s):  INR, PROTIME in the last 168 hours.  No results found for this or any previous visit (from the past 240 hour(s)).      Radiology Studies: No results found.      Scheduled Meds:  vitamin C  500 mg Oral Daily   collagenase   Topical Daily   cyanocobalamin  1,000 mcg Intramuscular Daily   enoxaparin (LOVENOX) injection  100 mg Subcutaneous Q24H   feeding supplement  237 mL Oral TID BM   folic acid  1 mg Oral Daily   furosemide  40 mg Oral Daily   hydrocerin   Topical BID   iron polysaccharides  150 mg Oral Daily   multivitamin with minerals  1 tablet Oral Daily   nutrition supplement (JUVEN)  1 packet Oral BID BM   potassium chloride  20 mEq Oral BID   [START ON 11/05/2020] vitamin B-12  100 mcg Oral Daily   Vitamin D (Ergocalciferol)  50,000 Units Oral Q7 days   Continuous Infusions:   LOS: 23 days     Jacquelin Hawking, MD [facility] 11/02/2020, 8:14 AM  If 7PM-7AM, please contact night-coverage [email or web address]

## 2020-11-03 NOTE — Progress Notes (Addendum)
Physical Therapy Re-evaluation Patient Details Name: Kayan Blissett MRN: [id number] DOB: September 28, 1975 Today's Date: 11/03/2020    History of Present Illness Pt is a 45 y/o M who presented to ED on 10/09/20 with c/c of bilateral lower abdominal pain & c/o emesis twice that day after eating. Pt found to have abdominal wall purulent cellulitis. Pt admitted with clinical sepsis & AKI. PMH: morbid obesity, HTN. Pt with increased SOB starting 8/18 and CXR ordered with concern for free air. CT showed pneumoperitoneum suspicious for bowel perforation. Pt now s/p diagnostic laparoscopy converted to exploratory laparotomy, primary repair of cecal perforation and t/f to ICU on 8/19.    PT Comments    Pt in bed, willing to work with therapy and cooperative throughout. Pt on 2L O2 w/ SpO2 > 90% throughout treatment session. Pt requires mod-A for bed mobility for physical assist to trunk. Verbal cues provided for rolling technique and sidyling > sit, bed rails removed, and HOB 30 degrees. Pt demonstrates excellent transfer technique w/ good eccentric control during stand > sit, all w/ RW, min-A to stabilize RW only. Ambulated w/ RW, min-guard w/ increased rest breaks from previous session likely due to decreased exercise activity over weekend. Pt education provided on decreasing energy expenditure w/ gait by limiting conversation, decreasing overall gait speed as necessary. Skilled PT intervention is indicated to address deficits in function, mobility, and to return to PLOF as able.  Discharge recommendations remain SNF.     Follow Up Recommendations  SNF;Supervision/Assistance - 24 hour;Supervision for mobility/OOB     Equipment Recommendations  Other (comment);3in1 (PT);Rolling walker with 5" wheels (bariatric)    Recommendations for Other Services       Precautions / Restrictions Precautions Precautions: Fall Restrictions Weight Bearing Restrictions: No    Mobility  Bed Mobility Overal bed  mobility: Needs Assistance Bed Mobility: Rolling;Sidelying to Sit Rolling: Min guard Sidelying to sit: Mod assist;HOB elevated (30 degrees)       General bed mobility comments: Verbal cues for rolling technique, MOD-A for trunk physical assist, bed rails removed    Transfers Overall transfer level: Needs assistance Equipment used: Rolling walker (2 wheeled)   Sit to Stand: Min assist         General transfer comment: Min-A for RW stabilization  Ambulation/Gait Ambulation/Gait assistance: Min guard Gait Distance (Feet): 120 Feet Assistive device: Rolling walker (2 wheeled) Gait Pattern/deviations: Step-through pattern;Trunk flexed Gait velocity: decreased   General Gait Details: 2 seated rest breaks w/ 79ft, 34ft w/o seated rest break; HRmax 125, decreases 110 bpm < 60s w/ 2 L O2. Increased SOB noted ~ 40 ft into amb w/ SpO2 > 95%   Stairs             Wheelchair Mobility    Modified Rankin (Stroke Patients Only)       Balance Overall balance assessment: Needs assistance Sitting-balance support: No upper extremity supported;Feet supported Sitting balance-[name]: Good     Standing balance support: During functional activity;Bilateral upper extremity supported Standing balance-[name]: Fair Standing balance comment: Requires BUE w/ min-gaurd                            Cognition Arousal/Alertness: Awake/alert Behavior During Therapy: WFL for tasks assessed/performed Overall Cognitive Status: Within Functional Limits for tasks assessed  Exercises Other Exercises Other Exercises: Standing w/ RW, min-gaurd for BM clean up, STS x 2 w/ min-A    General Comments        Pertinent Vitals/Pain Pain Assessment: Faces Faces Pain Scale: Hurts a little bit Pain Location: sacral wound Pain Descriptors / Indicators: Discomfort;Dull Pain Intervention(s): Limited activity within patient's  tolerance;Monitored during session    Home Living                      Prior Function            PT Goals (current goals can now be found in the care plan section) Acute Rehab PT Goals Patient Stated Goal: be able to return to work again PT Goal Formulation: With patient Time For Goal Achievement: 11/17/20 Potential to Achieve Goals: Fair Additional Goals Additional Goal #1: Perform a 5xSTS < 20s w/ LRAD, mod-I to improve power necessary for ADLs Progress towards PT goals: Progressing toward goals    Frequency    Min 2X/week      PT Plan Current plan remains appropriate    Co-evaluation              AM-PAC PT "6 Clicks" Mobility   Outcome Measure  Help needed turning from your back to your side while in a flat bed without using bedrails?: A Little Help needed moving from lying on your back to sitting on the side of a flat bed without using bedrails?: A Lot Help needed moving to and from a bed to a chair (including a wheelchair)?: A Little Help needed standing up from a chair using your arms (e.g., wheelchair or bedside chair)?: A Little Help needed to walk in hospital room?: A Little Help needed climbing 3-5 steps with a railing? : Total 6 Click Score: 15    End of Session Equipment Utilized During Treatment: Oxygen;Gait belt Activity Tolerance: Patient tolerated treatment well;Patient limited by fatigue Patient left: in chair;with call bell/phone within reach;with chair alarm set   PT Visit Diagnosis: Muscle weakness (generalized) (M62.81);Difficulty in walking, not elsewhere classified (R26.2)     Time: 1050-1140 PT Time Calculation (min) (ACUTE ONLY): 50 min  Charges:                        [employer], SPT

## 2020-11-03 NOTE — Progress Notes (Signed)
PROGRESS NOTE    Christian [name]  QIO:[id number] DOB: 03/17/1975 DOA: 10/09/2020 PCP: Oneita Hurt, No   Brief Narrative: Christian [name] is a 45 y.o. male with a history of morbid obesity, hypertension.  Patient presents secondary to lower abdominal pain with evidence of abdominal wall cellulitis.  He is also found to have lower extremity cellulitis.  He was started empirically antibiotics with improvement.  While admitted, patient developed pneumoperitoneum with cecal perforation requiring surgical repair.  Patient was also found to be hypoxic and hypercapnic requiring BiPAP at night.  Patient is currently stable for discharge but discharge is complicated by poor social situation.   Assessment & Plan:   Principal Problem:   Severe sepsis (HCC) Active Problems:   Morbid obesity (HCC)   Hyperglycemia   AKI (acute kidney injury) (HCC)   Pressure ulcer of back   Pressure ulcer of buttock   Essential hypertension   Cellulitis of abdominal wall   Impaired fasting glucose   Acute respiratory failure with hypoxia (HCC)   Weakness   Acute on chronic respiratory failure with hypoxia and hypercapnia (HCC)   Bowel perforation (HCC)   Hypotension   Obesity hypoventilation syndrome (HCC)   Pneumoperitoneum with cecal perforation Diagnosed on CT scan from 8/18.  General surgery was consulted.  Started empirically on Zosyn IV.  Patient underwent diagnostic laparoscopy converted to open laparotomy on 8/18 with repair of bowel perforation.  Completed IV antibiotic course.  Currently stable.  Abdominal wall cellulitis Lower extremity cellulitis Patient started empirically on Vancomycin IV and transitioned to Keflex. Also treated with fluconazole which was transitioned to anidulafungin. Antiinfective course completed.  Severe sepsis Secondary to cellulitis above. Blood cultures with no growth. Antibiotics as mentioned above.  Morbid obesity Body mass index is 55.55 kg/m.  AKI Peak  creatinine of 1.43 with quick resolution with IV fluids. Resolved.  Acute respiratory failure with hypoxia and hypercapnia Hypercapnia with pCO2 up to 81 on ABG from 8/19. Patient currently on BiPAP. No official diagnoses but possibly related to OSA/OHS -Continue BiPAP qhs  Weakness PT recommending SNF.  Normocytic anemia Mild anemia. Possibly related to iron deficiency although normocytic. Mild anemia prior to abdominal surgery, but has been more persistent since surgery. Currently stable.  Low iron Associated low TIBC and saturation ratio. Patient started on supplemental iron. Ferritin normal. Not completely consistent with iron deficiency. -Continue iron supplementation  Calcaneal spur Midfoot osteoarthritis Causing pain with ambulation. Would benefit from sports medicine or orthopedic surgery follow-up as an outpatient if able.  Pressure injury Left/right buttock, left/right heel, right foot, posterior/proximal right thigh.  All pressure injuries present on admission.  Poor social situation Patient is homeless and lives in his car. Ability to work limited by inability to ambulate well per patient. Patient without insurance.   DVT prophylaxis: Lovenox Code Status:   Code Status: Full Code Family Communication: None at bedside Disposition Plan: Discharge to SNF when safe discharge plan arranged. Otherwise medically stable if able to use BiPAP/oxygen as an outpatient.   Consultants:  General surgery  Procedures:  DIAGNOSTIC LAPAROSCOPY CONVERTED TO OPEN LAPAROTOMY WITH REPAIR OF BOWEL PERFORATION  Antimicrobials: Vancomycin IV Zosyn IV Ceftriaxone IV  Fluconazole Cephalexin PO Anidulafungin IV   Subjective: No issues noted overnight  Objective: Vitals:   11/02/20 0834 11/02/20 1526 11/02/20 2020 11/03/20 0324  BP: 129/81 129/73 135/77 125/85  Pulse: 75 94 91 79  Resp:  20 18 20   Temp: 98.6 F (37 C) 98.5 F (36.9 C) 98.1  F (36.7 C) 98.7 F (37.1 C)   TempSrc: Tympanic Oral    SpO2: 100% 96% 96% 99%  Weight:      Height:        Intake/Output Summary (Last 24 hours) at 11/03/2020 0900 Last data filed at 11/03/2020 0320 Gross per 24 hour  Intake 480 ml  Output 650 ml  Net -170 ml    Filed Weights   10/09/20 0807 10/15/20 1600  Weight: (!) 197.3 kg (!) 201.6 kg    Examination:  General exam: Appears calm and comfortable. On BiPAP Respiratory system: Respiratory effort normal. Gastrointestinal system: Abdomen is obese   Data Reviewed: I have personally reviewed following labs and imaging studies  CBC Lab Results  Component Value Date   WBC 7.5 11/02/2020   RBC 4.41 11/02/2020   HGB 11.5 (L) 11/02/2020   HCT 36.4 (L) 11/02/2020   MCV 82.5 11/02/2020   MCH 26.1 11/02/2020   PLT 359 11/02/2020   MCHC 31.6 11/02/2020   RDW 14.6 11/02/2020   LYMPHSABS 1.4 10/19/2020   MONOABS 1.0 10/19/2020   EOSABS 0.5 10/19/2020   BASOSABS 0.1 10/19/2020     Last metabolic panel Lab Results  Component Value Date   NA 138 11/02/2020   K 3.9 11/02/2020   CL 99 11/02/2020   CO2 32 11/02/2020   BUN 17 11/02/2020   CREATININE 0.85 11/02/2020   GLUCOSE 108 (H) 11/02/2020   GFRNONAA >60 11/02/2020   GFRAA >60 09/23/2019   CALCIUM 8.4 (L) 11/02/2020   PHOS 5.1 (H) 10/31/2020   PROT 7.0 10/09/2020   ALBUMIN 2.8 (L) 10/09/2020   BILITOT 1.5 (H) 10/09/2020   ALKPHOS 60 10/09/2020   AST 24 10/09/2020   ALT 24 10/09/2020   ANIONGAP 7 11/02/2020    CBG (last 3)  No results for input(s): GLUCAP in the last 72 hours.   GFR: Estimated Creatinine Clearance: 201.7 mL/min (by C-G formula based on SCr of 0.85 mg/dL).  Coagulation Profile: No results for input(s): INR, PROTIME in the last 168 hours.  No results found for this or any previous visit (from the past 240 hour(s)).      Radiology Studies: No results found.      Scheduled Meds:  vitamin C  500 mg Oral Daily   collagenase   Topical Daily   cyanocobalamin   1,000 mcg Intramuscular Daily   enoxaparin (LOVENOX) injection  100 mg Subcutaneous Q24H   feeding supplement  237 mL Oral TID BM   folic acid  1 mg Oral Daily   furosemide  40 mg Oral Daily   hydrocerin   Topical BID   iron polysaccharides  150 mg Oral Daily   multivitamin with minerals  1 tablet Oral Daily   nutrition supplement (JUVEN)  1 packet Oral BID BM   potassium chloride  20 mEq Oral BID   [START ON 11/05/2020] vitamin B-12  100 mcg Oral Daily   Vitamin D (Ergocalciferol)  50,000 Units Oral Q7 days   Continuous Infusions:   LOS: 24 days     Jacquelin Hawking, MD [facility] 11/03/2020, 9:00 AM  If 7PM-7AM, please contact night-coverage [email or web address]

## 2020-11-04 NOTE — TOC Progression Note (Signed)
Transition of Care [facility]) - Progression Note    Patient Details  Name: Mccartney Chuba MRN: [id number] Date of Birth: 1975-05-11  [facility] CM/SW Contact  Chapman Fitch, RN Phone Number: 11/04/2020, 3:17 PM  Clinical Narrative:    Spoke with patient at bedside. He states that he has spoken with his sister [name] via email [email or web address]. He states that she maybe able to provide some assistance at discharge. Patient gave me permission to contact sister via email   Email Sent "My name is Judeth Cornfield, I'm a Sports coach at [employer].  Your brother provided me with your email address.  I would like to be able to discuss discharge planning.   Do you mind providing me your contact information so I can reach out?"     Barriers to Discharge: Continued Medical Work up  Expected Discharge Plan and Services         Living arrangements for the past 2 months: Homeless                                       Social Determinants of Health (SDOH) Interventions    Readmission Risk Interventions Readmission Risk Prevention Plan 10/11/2020  Post Dischage Appt Complete  Medication Screening Complete  Transportation Screening Complete  Some recent data might be hidden

## 2020-11-04 NOTE — Plan of Care (Signed)

## 2020-11-04 NOTE — Progress Notes (Signed)
Nutrition Follow-up  DOCUMENTATION CODES:   Morbid obesity  INTERVENTION:   Ensure Enlive po TID, each supplement provides 350 kcal and 20 grams of protein  Juven Fruit Punch BID, each serving provides 95kcal and 2.5g of protein (amino acids glutamine and arginine)  MVI po daily   Double protein with meals   NUTRITION DIAGNOSIS:   Increased nutrient needs related to wound healing as evidenced by estimated needs.  GOAL:   Patient will meet greater than or equal to 90% of their needs -met   MONITOR:   PO intake, Supplement acceptance, Labs, Weight trends, Skin, I & O's  ASSESSMENT:   45 yo male with a PMH of morbid obesity and HTN who presents with severe sepsis 2/2 abdominal wall and bilateral lower extremity cellulitis. Also with pneumoperitoneum with cecal perforation (s/p ex lap) and AKI (now resolved).  Pt continues to have good appetite and oral intake in hospital; pt eating 80-100% of meals in hospital and is drinking Ensure supplements. Pt is also receiving double protein with meal trays. No new weight since 8/18.   Medications reviewed and include: vitamin C, lovenox, lasix, folic acid, iron polysaccharides, MVI, juven, KCl, vitamin D, B12  Labs reviewed:   Diet Order:   Diet Order             Diet Heart Room service appropriate? Yes; Fluid consistency: Thin  Diet effective 1400                  EDUCATION NEEDS:   Education needs have been addressed  Skin:  Skin Assessment: Skin Integrity Issues: Skin Integrity Issues:: Stage II, Unstageable, Incisions Stage II: L heel, R heel, R foot Unstageable: L buttocks and R buttocks Incisions: Abdomen, closed  Last BM:  9/7- type 4  Height:   Ht Readings from Last 1 Encounters:  10/09/20 _0  (1.905 m)    Weight:   Wt Readings from Last 1 Encounters:  10/15/20 (!) 201.6 kg   BMI:  Body mass index is 55.55 kg/m.  Estimated Nutritional Needs:   Kcal:  3200-3500kcal/day  Protein:   >160g/day  Fluid:  2.7-3.0L/day  Koleen Distance MS, RD, LDN Please refer to [facility] for RD and/or RD on-call/weekend/after hours pager

## 2020-11-04 NOTE — Progress Notes (Signed)
Occupational Therapy Treatment Patient Details Name: Christian [name] MRN: [id number] DOB: 20-Oct-1975 Today's Date: 11/04/2020    History of present illness Pt is a 45 y/o M who presented to ED on 10/09/20 with c/c of bilateral lower abdominal pain & c/o emesis twice that day after eating. Pt found to have abdominal wall purulent cellulitis. Pt admitted with clinical sepsis & AKI. PMH: morbid obesity, HTN. Pt with increased SOB starting 8/18 and CXR ordered with concern for free air. CT showed pneumoperitoneum suspicious for bowel perforation. Pt now s/p diagnostic laparoscopy converted to exploratory laparotomy, primary repair of cecal perforation and t/f to ICU on 8/19.   OT comments  Christian [name] was seen for OT treatment on this date. Upon arrival to room pt reclined in bed agreeable for OOB to toilet. To exit L side of bed requires cues for technqiue, grip on edge of mattress, and assist for O2 line mgmt however no physical assist to achieve sitting. MAX A for LBD seated EOB. MOD A + RW for initial sit<>stand from bed height improving to MIN A + RW standing from [facility]. Pt tolerates <8 min standing for perihygiene and wound care (RN in room to address leaking bandages). Pt requires CGA + single UE support for perihygiene in standing using washcloths - continues to require MAX A for thoroughness. Pt making good progress toward goals. Pt continues to benefit from skilled OT services to maximize return to PLOF and minimize risk of future falls, injury, caregiver burden, and readmission. Will continue to follow POC. Discharge recommendation remains appropriate.    Follow Up Recommendations  SNF;Supervision/Assistance - 24 hour    Equipment Recommendations  Other (comment) (defer to next venue of care)    Recommendations for Other Services      Precautions / Restrictions Precautions Precautions: Fall Restrictions Weight Bearing Restrictions: No       Mobility Bed Mobility Overal bed mobility:  Needs Assistance Bed Mobility: Rolling;Sidelying to Sit Rolling: Min guard Sidelying to sit: Min assist;HOB elevated       General bed mobility comments: pt requires cues for technqiue, grip on edge of mattress, and assist for O2 line mgmt however no physical assist    Transfers Overall transfer level: Needs assistance Equipment used: Rolling walker (2 wheeled) Transfers: Sit to/from Stand Sit to Stand: Min assist              Balance Overall balance assessment: Needs assistance Sitting-balance support: No upper extremity supported;Feet supported Sitting balance-[name]: Good     Standing balance support: Single extremity supported;During functional activity Standing balance-[name]: Fair                             ADL either performed or assessed with clinical judgement   ADL Overall ADL's : Needs assistance/impaired                                       General ADL Comments: MAX A for LBD seated EOB. MOD A + RW for initial sit<>stand from bed height improving to MIN A + RW standing from [facility]. Pt tolerates <8 min standing for perihygiene and wound care (RN in room to address). Pt requires CGA + single UE support for perihygiene in standing using washcloths - continues to require MAX A for thoroughness.      Cognition Arousal/Alertness: Awake/alert Behavior  During Therapy: WFL for tasks assessed/performed Overall Cognitive Status: Within Functional Limits for tasks assessed                                 General Comments: Pt is A and O x 3 however poor insight of deficits overall medical concerns        Exercises Exercises: Other exercises Other Exercises Other Exercises: Pt educated re: d/c recs, log rolling, ECS, adapted toileting strategies, importance of visual inspection for wound care Other Exercises: toileting, sup>sit, sit<>stand x3, sitting/standing balance/tolerance   Shoulder Instructions        General Comments leg wounds noted to be bleeding, bandages near disintegrated - RN in to change dressing    Pertinent Vitals/ Pain       Pain Assessment: Faces Faces Pain Scale: Hurts a little bit Pain Location: posterior leg wound Pain Descriptors / Indicators: Discomfort;Dull Pain Intervention(s): Limited activity within patient's tolerance;Repositioned   Frequency  Min 2X/week        Progress Toward Goals  OT Goals(current goals can now be found in the care plan section)  Progress towards OT goals: Progressing toward goals  Acute Rehab OT Goals Patient Stated Goal: be able to return to work again OT Goal Formulation: With patient Time For Goal Achievement: 11/13/20 Potential to Achieve Goals: Good ADL Goals Pt Will Perform Upper Body Dressing: with supervision;standing Pt Will Perform Lower Body Dressing: with min assist;with adaptive equipment;sit to/from stand Pt Will Transfer to Toilet: with modified independence;ambulating;bedside commode Pt/caregiver will Perform Home Exercise Program: Both right and left upper extremity;With theraband;With written HEP provided;Independently;Increased strength  Plan Discharge plan remains appropriate;Frequency remains appropriate    Co-evaluation                 AM-PAC OT "6 Clicks" Daily Activity     Outcome Measure   Help from another person eating meals?: None Help from another person taking care of personal grooming?: A Little Help from another person toileting, which includes using toliet, bedpan, or urinal?: A Lot Help from another person bathing (including washing, rinsing, drying)?: A Lot Help from another person to put on and taking off regular upper body clothing?: A Little Help from another person to put on and taking off regular lower body clothing?: A Lot 6 Click Score: 16    End of Session Equipment Utilized During Treatment: Oxygen;Rolling walker  OT Visit Diagnosis: Unsteadiness on feet  (R26.81);Muscle weakness (generalized) (M62.81)   Activity Tolerance Patient tolerated treatment well   Patient Left in chair;with call bell/phone within reach;with chair alarm set   Nurse Communication Mobility status        Time: 1020-1046 OT Time Calculation (min): 26 min  Charges: OT General Charges $OT Visit: 1 Visit OT Treatments $Self Care/Home Management : 23-37 mins  Kathie Dike, M.S. OTR/L  11/04/20, 11:09 AM  ascom [phone]

## 2020-11-04 NOTE — Progress Notes (Signed)
Patient ID: Christian [name], male   DOB: 29-Oct-1975, 45 y.o.   MRN: [id number]  PROGRESS NOTE    Christian [name]  XTK:[id number] DOB: 02/24/1976 DOA: 10/09/2020 PCP: Pcp, No   Brief Narrative:  45 y.o. male with a history of morbid obesity, hypertension presented with  lower abdominal pain with evidence of abdominal wall cellulitis and also found to have lower extremity cellulitis.  He was treated with antibiotics with improvement.  During the hospitalizing, he developed pneumoperitoneum with cecal perforation requiring surgical repair.  Patient was also found to be hypoxic and hypercapnic requiring BiPAP at night.  Patient is currently stable for discharge.   Assessment & Plan:   Pneumoperitoneum with cecal perforation -Underwent diagnostic laparoscopy converted to open laparotomy on 10/15/2020 with repair of bowel perforation.  Patient has completed IV antibiotic course.  General surgery has signed off.  Currently stable.  Abdominal wall cellulitis/lower extremity cellulitis -Has completed course of IV and oral antibiotics along with antifungals.  Severe sepsis: Present on admission; resolved. -Hemodynamically stable currently  Morbid obesity -Outpatient follow-up  Acute respiratory failure with hypoxia and hypercapnia -PCO2 up to 81 on ABG from 10/16/2020.  Currently requiring BiPAP at night.  No official diagnosis but possibly related to OSA/OHS.  Continue BiPAP nightly.  AKI -Resolved.  Weakness/generalized deconditioning -PT recommending SNF.  Social worker following.  Chronic normocytic anemia Iron deficiency anemia -Hemoglobin stable currently. -Continue iron supplementation  Poor social situation/homelessness -Child psychotherapist following  Various pressure injuries: Present on admission -I agree with pressure injury documentation as below.  Continue wound care. Pressure Injury 10/09/20 Buttocks Left Unstageable - Full thickness tissue loss in which the base of the injury  is covered by slough (yellow, tan, gray, green or brown) and/or eschar (tan, brown or black) in the wound bed. (Active)  10/09/20   Location: Buttocks  Location Orientation: Left  Staging: Unstageable - Full thickness tissue loss in which the base of the injury is covered by slough (yellow, tan, gray, green or brown) and/or eschar (tan, brown or black) in the wound bed.  Wound Description (Comments):   Present on Admission: Yes     Pressure Injury 10/09/20 Buttocks Right Unstageable - Full thickness tissue loss in which the base of the injury is covered by slough (yellow, tan, gray, green or brown) and/or eschar (tan, brown or black) in the wound bed. (Active)  10/09/20 1933  Location: Buttocks  Location Orientation: Right  Staging: Unstageable - Full thickness tissue loss in which the base of the injury is covered by slough (yellow, tan, gray, green or brown) and/or eschar (tan, brown or black) in the wound bed.  Wound Description (Comments):   Present on Admission: Yes     Pressure Injury 10/09/20 Heel Left Stage 2 -  Partial thickness loss of dermis presenting as a shallow open injury with a red, pink wound bed without slough. (Active)  10/09/20   Location: Heel  Location Orientation: Left  Staging: Stage 2 -  Partial thickness loss of dermis presenting as a shallow open injury with a red, pink wound bed without slough.  Wound Description (Comments):   Present on Admission: Yes     Pressure Injury 10/09/20 Heel Right Stage 2 -  Partial thickness loss of dermis presenting as a shallow open injury with a red, pink wound bed without slough. (Active)  10/09/20   Location: Heel  Location Orientation: Right  Staging: Stage 2 -  Partial thickness loss of dermis presenting as a shallow  open injury with a red, pink wound bed without slough.  Wound Description (Comments):   Present on Admission: Yes     Pressure Injury Foot Right Stage 2 -  Partial thickness loss of dermis presenting as a  shallow open injury with a red, pink wound bed without slough. (Active)     Location: Foot  Location Orientation: Right  Staging: Stage 2 -  Partial thickness loss of dermis presenting as a shallow open injury with a red, pink wound bed without slough.  Wound Description (Comments):   Present on Admission: Yes     Pressure Injury 10/22/20 Thigh Posterior;Proximal;Right Unstageable - Full thickness tissue loss in which the base of the injury is covered by slough (yellow, tan, gray, green or brown) and/or eschar (tan, brown or black) in the wound bed. (Active)  10/22/20 1055  Location: Thigh  Location Orientation: Posterior;Proximal;Right  Staging: Unstageable - Full thickness tissue loss in which the base of the injury is covered by slough (yellow, tan, gray, green or brown) and/or eschar (tan, brown or black) in the wound bed.  Wound Description (Comments):   Present on Admission: Yes (per handoff and report from other RN on unit- wound present on presentation to floor, patient reports wound present on admission- WOCN consulted)     DVT prophylaxis: Lovenox Code Status: Full Family Communication: None at bedside Disposition Plan: Status is: Inpatient  Remains inpatient appropriate because:Unsafe d/c plan  Dispo: The patient is from: Home              Anticipated d/c is to: SNF              Patient currently is medically stable to d/c.   Difficult to place patient Yes  Consultants: General surgery  Procedures: Diagnostic laparoscopy converted to open laparotomy with repair of bowel perforation  Antimicrobials:  Anti-infectives (From admission, onward)    Start     Dose/Rate Route Frequency Ordered Stop   10/17/20 1800  anidulafungin (ERAXIS) 100 mg in sodium chloride 0.9 % 100 mL IVPB  Status:  Discontinued       See Hyperspace for full Linked Orders Report.   100 mg 78 mL/hr over 100 Minutes Intravenous Every 24 hours 10/16/20 1706 10/18/20 1221   10/16/20 1800   anidulafungin (ERAXIS) 200 mg in sodium chloride 0.9 % 200 mL IVPB       See Hyperspace for full Linked Orders Report.   200 mg 78 mL/hr over 200 Minutes Intravenous  Once 10/16/20 1706 10/16/20 2141   10/15/20 2030  piperacillin-tazobactam (ZOSYN) IVPB 3.375 g        3.375 g 12.5 mL/hr over 240 Minutes Intravenous Every 8 hours 10/15/20 1925 10/19/20 2358   10/12/20 2200  cephALEXin (KEFLEX) capsule 1,000 mg  Status:  Discontinued        1,000 mg Oral Every 8 hours 10/12/20 1416 10/15/20 1917   10/12/20 1600  fluconazole (DIFLUCAN) tablet 200 mg        200 mg Oral Every 24 hours 10/12/20 1417 10/17/20 1559   10/11/20 1600  fluconazole (DIFLUCAN) tablet 400 mg  Status:  Discontinued        400 mg Oral Every 24 hours 10/10/20 1519 10/12/20 1417   10/10/20 1615  fluconazole (DIFLUCAN) tablet 800 mg        800 mg Oral  Once 10/10/20 1519 10/10/20 1641   10/10/20 1000  cefTRIAXone (ROCEPHIN) 2 g in sodium chloride 0.9 % 100 mL IVPB  Status:  Discontinued        2 g 200 mL/hr over 30 Minutes Intravenous Every 24 hours 10/09/20 1236 10/09/20 1408   10/10/20 0000  vancomycin (VANCOREADY) IVPB 1750 mg/350 mL  Status:  Discontinued        1,750 mg 175 mL/hr over 120 Minutes Intravenous Every 12 hours 10/09/20 1722 10/12/20 1415   10/09/20 1430  vancomycin (VANCOREADY) IVPB 1500 mg/300 mL  Status:  Discontinued        1,500 mg 150 mL/hr over 120 Minutes Intravenous  Once 10/09/20 1414 10/10/20 0120   10/09/20 1145  cefTRIAXone (ROCEPHIN) 1 g in sodium chloride 0.9 % 100 mL IVPB        1 g 200 mL/hr over 30 Minutes Intravenous  Once 10/09/20 1139 10/09/20 1356   10/09/20 1145  vancomycin (VANCOCIN) IVPB 1000 mg/200 mL premix        1,000 mg 200 mL/hr over 60 Minutes Intravenous  Once 10/09/20 1139 10/09/20 1656        Subjective: Patient seen and examined at bedside.  Denies worsening fever or shortness of breath.  Objective: Vitals:   11/03/20 1549 11/03/20 2101 11/04/20 0348 11/04/20  0829  BP: 140/84 140/78 116/75 129/79  Pulse: 87 90 76 78  Resp: 18 20 20 18   Temp: 98.1 F (36.7 C) 98.1 F (36.7 C) 98.1 F (36.7 C) 97.6 F (36.4 C)  TempSrc: Oral Oral Oral Oral  SpO2: 96% 97% 99% 99%  Weight:      Height:        Intake/Output Summary (Last 24 hours) at 11/04/2020 1053 Last data filed at 11/04/2020 1032 Gross per 24 hour  Intake 597 ml  Output 850 ml  Net -253 ml   Filed Weights   10/09/20 0807 10/15/20 1600  Weight: (!) 197.3 kg (!) 201.6 kg    Examination:  General exam: Appears calm and comfortable.  Looks chronically ill.  Currently on BiPAP. Respiratory system: Bilateral decreased breath sounds at bases with scattered crackles Cardiovascular system: S1 & S2 heard, Rate controlled Gastrointestinal system: Abdomen is morbidly obese, distended, soft and nontender. Normal bowel sounds heard. Extremities: No cyanosis, clubbing; bilateral lower extremity edema present   Data Reviewed: I have personally reviewed following labs and imaging studies  CBC: Recent Labs  Lab 10/29/20 0624 10/30/20 0617 10/31/20 0520 11/01/20 0509 11/02/20 0539  WBC 7.4 7.9 8.1 8.3 7.5  HGB 11.5* 11.9* 12.1* 12.0* 11.5*  HCT 35.7* 37.7* 38.9* 38.9* 36.4*  MCV 85.4 84.0 83.3 84.2 82.5  PLT 390 441* 422* 394 359   Basic Metabolic Panel: Recent Labs  Lab 10/29/20 0624 10/30/20 0617 10/31/20 0520 11/01/20 0509 11/02/20 0539  NA 135 138 141 138 138  K 3.8 3.8 4.0 4.5 3.9  CL 95* 99 102 99 99  CO2 33* 35* 34* 32 32  GLUCOSE 111* 115* 112* 100* 108*  BUN 18 20 21* 18 17  CREATININE 0.81 0.80 0.93 0.89 0.85  CALCIUM 8.3* 8.4* 8.4* 8.6* 8.4*  MG 2.1 2.4 2.3  --   --   PHOS 4.5 5.1* 5.1*  --   --    GFR: Estimated Creatinine Clearance: 201.7 mL/min (by C-G formula based on SCr of 0.85 mg/dL). Liver Function Tests: No results for input(s): AST, ALT, ALKPHOS, BILITOT, PROT, ALBUMIN in the last 168 hours. No results for input(s): LIPASE, AMYLASE in the last 168  hours. No results for input(s): AMMONIA in the last 168 hours. Coagulation Profile: No results for input(s): INR,  PROTIME in the last 168 hours. Cardiac Enzymes: No results for input(s): CKTOTAL, CKMB, CKMBINDEX, TROPONINI in the last 168 hours. BNP (last 3 results) No results for input(s): PROBNP in the last 8760 hours. HbA1C: No results for input(s): HGBA1C in the last 72 hours. CBG: Recent Labs  Lab 10/29/20 2037  GLUCAP 142*   Lipid Profile: No results for input(s): CHOL, HDL, LDLCALC, TRIG, CHOLHDL, LDLDIRECT in the last 72 hours. Thyroid Function Tests: No results for input(s): TSH, T4TOTAL, FREET4, T3FREE, THYROIDAB in the last 72 hours. Anemia Panel: Recent Labs    11/02/20 0539  FERRITIN 200   Sepsis Labs: No results for input(s): PROCALCITON, LATICACIDVEN in the last 168 hours.  No results found for this or any previous visit (from the past 240 hour(s)).       Radiology Studies: No results found.      Scheduled Meds:  vitamin C  500 mg Oral Daily   collagenase   Topical Daily   enoxaparin (LOVENOX) injection  100 mg Subcutaneous Q24H   feeding supplement  237 mL Oral TID BM   folic acid  1 mg Oral Daily   furosemide  40 mg Oral Daily   hydrocerin   Topical BID   iron polysaccharides  150 mg Oral Daily   multivitamin with minerals  1 tablet Oral Daily   nutrition supplement (JUVEN)  1 packet Oral BID BM   potassium chloride  20 mEq Oral BID   [START ON 11/05/2020] vitamin B-12  100 mcg Oral Daily   Vitamin D (Ergocalciferol)  50,000 Units Oral Q7 days   Continuous Infusions:        Glade Lloyd, MD [facility] 11/04/2020, 10:53 AM

## 2020-11-04 NOTE — Plan of Care (Signed)

## 2020-11-05 MED ORDER — INFLUENZA VAC SPLIT QUAD 0.5 ML IM SUSY
0.5000 mL | PREFILLED_SYRINGE | INTRAMUSCULAR | Status: AC
  Administered 2020-11-07: 0.5 mL via INTRAMUSCULAR
  Filled 2020-11-05: qty 0.5

## 2020-11-05 MED ORDER — ASCORBIC ACID 500 MG PO TABS
500.0000 mg | ORAL_TABLET | Freq: Two times a day (BID) | ORAL | Status: DC
  Administered 2020-11-05 – 2020-11-30 (×50): 500 mg via ORAL
  Filled 2020-11-05 (×50): qty 1

## 2020-11-05 NOTE — TOC Progression Note (Signed)
Transition of Care [facility]) - Progression Note    Patient Details  Name: Christian [name] MRN: [id number] Date of Birth: [birth year]  [facility]) CM/SW Contact  Chapman Fitch, RN Phone Number: 11/05/2020, 5:11 PM  Clinical Narrative:     Received call from sister [name] Per [name] - patient was in special needs classes as child - Patient has a history of not being able to keep a consistent job - has history of hoarding, and unlivable  living conditions - both parents are deceased - She has not seen patient since 2008, last spoke to him in 2015 - She is willing to pay 2 weeks in an extended stay hotel, but nothing further - she is going to attempt getting medical records from [place] that would support a chronic diagnosis     Patient remains with continuous O2, and biapap, along with wet to dry dressings     Barriers to Discharge: Continued Medical Work up  Expected Discharge Plan and Services         Living arrangements for the past 2 months: Homeless                                       Social Determinants of Health (SDOH) Interventions    Readmission Risk Interventions Readmission Risk Prevention Plan 10/11/2020  Post Dischage Appt Complete  Medication Screening Complete  Transportation Screening Complete  Some recent data might be hidden

## 2020-11-05 NOTE — TOC Progression Note (Signed)
Transition of Care [facility]) - Progression Note    Patient Details  Name: Christian [name] MRN: [id number] Date of Birth: 1975/09/26  Transition of Care [facility]) CM/SW Contact  Chapman Fitch, RN Phone Number: 11/05/2020, 1:33 PM  Clinical Narrative:    Received return email from patient's sister.  TOC supervisor has reached out to sister to arrange a time to discuss discharge disposition.   Per MD will not be able to wean nightly BIPAP  VM left for pastor Bruce to request for him to go ahead and arrange for the church to go head and clean out the patient's vehicle     Barriers to Discharge: Continued Medical Work up  Expected Discharge Plan and Services         Living arrangements for the past 2 months: Homeless                                       Social Determinants of Health (SDOH) Interventions    Readmission Risk Interventions Readmission Risk Prevention Plan 10/11/2020  Post Dischage Appt Complete  Medication Screening Complete  Transportation Screening Complete  Some recent data might be hidden

## 2020-11-05 NOTE — Progress Notes (Signed)
Mobility Specialist - Progress Note   11/05/20 1000  Mobility  Activity Transferred to/from [facility];Transferred:  Bed to chair  Level of Assistance Standby assist, set-up cues, supervision of patient - no hands on  Assistive Device Front wheel walker  Distance Ambulated (ft) 6 ft  Mobility Out of bed to chair with meals;Out of bed for toileting  Mobility Response Tolerated well  Mobility performed by Mobility specialist  $Mobility charge 1 Mobility    Pt transferred bed-BSC-recliner. Needs in reach.    Filiberto Pinks Mobility Specialist 11/05/20, 10:05 AM

## 2020-11-05 NOTE — Progress Notes (Signed)
Patient ID: Christian [name], male   DOB: 29-Oct-1975, 45 y.o.   MRN: [id number]  PROGRESS NOTE    Christian [name]  XTK:[id number] DOB: 02/24/1976 DOA: 10/09/2020 PCP: Pcp, No   Brief Narrative:  45 y.o. male with a history of morbid obesity, hypertension presented with  lower abdominal pain with evidence of abdominal wall cellulitis and also found to have lower extremity cellulitis.  He was treated with antibiotics with improvement.  During the hospitalizing, he developed pneumoperitoneum with cecal perforation requiring surgical repair.  Patient was also found to be hypoxic and hypercapnic requiring BiPAP at night.  Patient is currently stable for discharge.   Assessment & Plan:   Pneumoperitoneum with cecal perforation -Underwent diagnostic laparoscopy converted to open laparotomy on 10/15/2020 with repair of bowel perforation.  Patient has completed IV antibiotic course.  General surgery has signed off.  Currently stable.  Abdominal wall cellulitis/lower extremity cellulitis -Has completed course of IV and oral antibiotics along with antifungals.  Severe sepsis: Present on admission; resolved. -Hemodynamically stable currently  Morbid obesity -Outpatient follow-up  Acute respiratory failure with hypoxia and hypercapnia -PCO2 up to 81 on ABG from 10/16/2020.  Currently requiring BiPAP at night.  No official diagnosis but possibly related to OSA/OHS.  Continue BiPAP nightly.  AKI -Resolved.  Weakness/generalized deconditioning -PT recommending SNF.  Social worker following.  Chronic normocytic anemia Iron deficiency anemia -Hemoglobin stable currently. -Continue iron supplementation  Poor social situation/homelessness -Child psychotherapist following  Various pressure injuries: Present on admission -I agree with pressure injury documentation as below.  Continue wound care. Pressure Injury 10/09/20 Buttocks Left Unstageable - Full thickness tissue loss in which the base of the injury  is covered by slough (yellow, tan, gray, green or brown) and/or eschar (tan, brown or black) in the wound bed. (Active)  10/09/20   Location: Buttocks  Location Orientation: Left  Staging: Unstageable - Full thickness tissue loss in which the base of the injury is covered by slough (yellow, tan, gray, green or brown) and/or eschar (tan, brown or black) in the wound bed.  Wound Description (Comments):   Present on Admission: Yes     Pressure Injury 10/09/20 Buttocks Right Unstageable - Full thickness tissue loss in which the base of the injury is covered by slough (yellow, tan, gray, green or brown) and/or eschar (tan, brown or black) in the wound bed. (Active)  10/09/20 1933  Location: Buttocks  Location Orientation: Right  Staging: Unstageable - Full thickness tissue loss in which the base of the injury is covered by slough (yellow, tan, gray, green or brown) and/or eschar (tan, brown or black) in the wound bed.  Wound Description (Comments):   Present on Admission: Yes     Pressure Injury 10/09/20 Heel Left Stage 2 -  Partial thickness loss of dermis presenting as a shallow open injury with a red, pink wound bed without slough. (Active)  10/09/20   Location: Heel  Location Orientation: Left  Staging: Stage 2 -  Partial thickness loss of dermis presenting as a shallow open injury with a red, pink wound bed without slough.  Wound Description (Comments):   Present on Admission: Yes     Pressure Injury 10/09/20 Heel Right Stage 2 -  Partial thickness loss of dermis presenting as a shallow open injury with a red, pink wound bed without slough. (Active)  10/09/20   Location: Heel  Location Orientation: Right  Staging: Stage 2 -  Partial thickness loss of dermis presenting as a shallow  open injury with a red, pink wound bed without slough.  Wound Description (Comments):   Present on Admission: Yes     Pressure Injury Foot Right Stage 2 -  Partial thickness loss of dermis presenting as a  shallow open injury with a red, pink wound bed without slough. (Active)     Location: Foot  Location Orientation: Right  Staging: Stage 2 -  Partial thickness loss of dermis presenting as a shallow open injury with a red, pink wound bed without slough.  Wound Description (Comments):   Present on Admission: Yes     Pressure Injury 10/22/20 Thigh Posterior;Proximal;Right Unstageable - Full thickness tissue loss in which the base of the injury is covered by slough (yellow, tan, gray, green or brown) and/or eschar (tan, brown or black) in the wound bed. (Active)  10/22/20 1055  Location: Thigh  Location Orientation: Posterior;Proximal;Right  Staging: Unstageable - Full thickness tissue loss in which the base of the injury is covered by slough (yellow, tan, gray, green or brown) and/or eschar (tan, brown or black) in the wound bed.  Wound Description (Comments):   Present on Admission: Yes (per handoff and report from other RN on unit- wound present on presentation to floor, patient reports wound present on admission- WOCN consulted)     DVT prophylaxis: Lovenox Code Status: Full Family Communication: None at bedside Disposition Plan: Status is: Inpatient  Remains inpatient appropriate because:Unsafe d/c plan  Dispo: The patient is from: Home              Anticipated d/c is to: SNF              Patient currently is medically stable to d/c.   Difficult to place patient Yes  Consultants: General surgery  Procedures: Diagnostic laparoscopy converted to open laparotomy with repair of bowel perforation  Antimicrobials:  Anti-infectives (From admission, onward)    Start     Dose/Rate Route Frequency Ordered Stop   10/17/20 1800  anidulafungin (ERAXIS) 100 mg in sodium chloride 0.9 % 100 mL IVPB  Status:  Discontinued       See Hyperspace for full Linked Orders Report.   100 mg 78 mL/hr over 100 Minutes Intravenous Every 24 hours 10/16/20 1706 10/18/20 1221   10/16/20 1800   anidulafungin (ERAXIS) 200 mg in sodium chloride 0.9 % 200 mL IVPB       See Hyperspace for full Linked Orders Report.   200 mg 78 mL/hr over 200 Minutes Intravenous  Once 10/16/20 1706 10/16/20 2141   10/15/20 2030  piperacillin-tazobactam (ZOSYN) IVPB 3.375 g        3.375 g 12.5 mL/hr over 240 Minutes Intravenous Every 8 hours 10/15/20 1925 10/19/20 2358   10/12/20 2200  cephALEXin (KEFLEX) capsule 1,000 mg  Status:  Discontinued        1,000 mg Oral Every 8 hours 10/12/20 1416 10/15/20 1917   10/12/20 1600  fluconazole (DIFLUCAN) tablet 200 mg        200 mg Oral Every 24 hours 10/12/20 1417 10/17/20 1559   10/11/20 1600  fluconazole (DIFLUCAN) tablet 400 mg  Status:  Discontinued        400 mg Oral Every 24 hours 10/10/20 1519 10/12/20 1417   10/10/20 1615  fluconazole (DIFLUCAN) tablet 800 mg        800 mg Oral  Once 10/10/20 1519 10/10/20 1641   10/10/20 1000  cefTRIAXone (ROCEPHIN) 2 g in sodium chloride 0.9 % 100 mL IVPB  Status:  Discontinued        2 g 200 mL/hr over 30 Minutes Intravenous Every 24 hours 10/09/20 1236 10/09/20 1408   10/10/20 0000  vancomycin (VANCOREADY) IVPB 1750 mg/350 mL  Status:  Discontinued        1,750 mg 175 mL/hr over 120 Minutes Intravenous Every 12 hours 10/09/20 1722 10/12/20 1415   10/09/20 1430  vancomycin (VANCOREADY) IVPB 1500 mg/300 mL  Status:  Discontinued        1,500 mg 150 mL/hr over 120 Minutes Intravenous  Once 10/09/20 1414 10/10/20 0120   10/09/20 1145  cefTRIAXone (ROCEPHIN) 1 g in sodium chloride 0.9 % 100 mL IVPB        1 g 200 mL/hr over 30 Minutes Intravenous  Once 10/09/20 1139 10/09/20 1356   10/09/20 1145  vancomycin (VANCOCIN) IVPB 1000 mg/200 mL premix        1,000 mg 200 mL/hr over 60 Minutes Intravenous  Once 10/09/20 1139 10/09/20 1656        Subjective: Patient seen and examined at bedside.  Poor historian.  No overnight vomiting, fever or chest pain reported.  Objective: Vitals:   11/04/20 0829 11/04/20 1505  11/04/20 1943 11/05/20 0459  BP: 129/79 (!) 149/92 125/77 128/74  Pulse: 78 87 83 79  Resp: 18 16 20 20   Temp: 97.6 F (36.4 C) 97.8 F (36.6 C) 98.8 F (37.1 C) 98.7 F (37.1 C)  TempSrc: Oral Oral Oral Oral  SpO2: 99% 97% 97% 98%  Weight:      Height:        Intake/Output Summary (Last 24 hours) at 11/05/2020 0816 Last data filed at 11/05/2020 0419 Gross per 24 hour  Intake 1080 ml  Output 1775 ml  Net -695 ml    Filed Weights   10/09/20 0807 10/15/20 1600  Weight: (!) 197.3 kg (!) 201.6 kg    Examination:  General exam: No distress.  Looks chronically ill.  On BiPAP currently. Respiratory system: Decreased breath sounds at bases bilaterally with some crackles  cardiovascular system: Rate controlled, S1-S2 heard Gastrointestinal system: Abdomen is morbidly obese, slightly distended, soft and nontender.  Bowel sounds are heard  extremities: Mild lower extremity edema present; no clubbing  Data Reviewed: I have personally reviewed following labs and imaging studies  CBC: Recent Labs  Lab 10/30/20 0617 10/31/20 0520 11/01/20 0509 11/02/20 0539  WBC 7.9 8.1 8.3 7.5  HGB 11.9* 12.1* 12.0* 11.5*  HCT 37.7* 38.9* 38.9* 36.4*  MCV 84.0 83.3 84.2 82.5  PLT 441* 422* 394 359    Basic Metabolic Panel: Recent Labs  Lab 10/30/20 0617 10/31/20 0520 11/01/20 0509 11/02/20 0539  NA 138 141 138 138  K 3.8 4.0 4.5 3.9  CL 99 102 99 99  CO2 35* 34* 32 32  GLUCOSE 115* 112* 100* 108*  BUN 20 21* 18 17  CREATININE 0.80 0.93 0.89 0.85  CALCIUM 8.4* 8.4* 8.6* 8.4*  MG 2.4 2.3  --   --   PHOS 5.1* 5.1*  --   --     GFR: Estimated Creatinine Clearance: 201.7 mL/min (by C-G formula based on SCr of 0.85 mg/dL). Liver Function Tests: No results for input(s): AST, ALT, ALKPHOS, BILITOT, PROT, ALBUMIN in the last 168 hours. No results for input(s): LIPASE, AMYLASE in the last 168 hours. No results for input(s): AMMONIA in the last 168 hours. Coagulation Profile: No  results for input(s): INR, PROTIME in the last 168 hours. Cardiac Enzymes: No results for input(s): CKTOTAL,  CKMB, CKMBINDEX, TROPONINI in the last 168 hours. BNP (last 3 results) No results for input(s): PROBNP in the last 8760 hours. HbA1C: No results for input(s): HGBA1C in the last 72 hours. CBG: Recent Labs  Lab 10/29/20 2037  GLUCAP 142*    Lipid Profile: No results for input(s): CHOL, HDL, LDLCALC, TRIG, CHOLHDL, LDLDIRECT in the last 72 hours. Thyroid Function Tests: No results for input(s): TSH, T4TOTAL, FREET4, T3FREE, THYROIDAB in the last 72 hours. Anemia Panel: No results for input(s): VITAMINB12, FOLATE, FERRITIN, TIBC, IRON, RETICCTPCT in the last 72 hours.  Sepsis Labs: No results for input(s): PROCALCITON, LATICACIDVEN in the last 168 hours.  No results found for this or any previous visit (from the past 240 hour(s)).       Radiology Studies: No results found.      Scheduled Meds:  vitamin C  500 mg Oral Daily   collagenase   Topical Daily   enoxaparin (LOVENOX) injection  100 mg Subcutaneous Q24H   feeding supplement  237 mL Oral TID BM   folic acid  1 mg Oral Daily   furosemide  40 mg Oral Daily   hydrocerin   Topical BID   iron polysaccharides  150 mg Oral Daily   multivitamin with minerals  1 tablet Oral Daily   nutrition supplement (JUVEN)  1 packet Oral BID BM   potassium chloride  20 mEq Oral BID   vitamin B-12  100 mcg Oral Daily   Vitamin D (Ergocalciferol)  50,000 Units Oral Q7 days   Continuous Infusions:        Glade Lloyd, MD [facility] 11/05/2020, 8:16 AM

## 2020-11-05 NOTE — Plan of Care (Signed)
°  Problem: Activity: °Goal: Risk for activity intolerance will decrease °Outcome: Progressing °  °Problem: Nutrition: °Goal: Adequate nutrition will be maintained °Outcome: Progressing °  °Problem: Clinical Measurements: °Goal: Diagnostic test results will improve °Outcome: Progressing °  °

## 2020-11-06 NOTE — Progress Notes (Signed)
Subjective:  CC: Christian [name] is a 45 y.o. male  Hospital stay day 27, 22 Days Post-Op ex-lap cecal perforation repair  HPI: No acute issues   ROS:  General: Denies weight loss, weight gain, fatigue, fevers, chills, and night sweats. Heart: Denies chest pain, palpitations, racing heart, irregular heartbeat, leg pain or swelling, and decreased activity tolerance. Respiratory: Denies breathing difficulty, shortness of breath, wheezing, cough, and sputum. GI: Denies change in appetite, heartburn, nausea, vomiting, constipation, diarrhea, and blood in stool. GU: Denies difficulty urinating, pain with urinating, urgency, frequency, blood in urine.   Objective:   Temp:  [97.8 F (36.6 C)-98.3 F (36.8 C)] 98.1 F (36.7 C) (09/09 0741) Pulse Rate:  [80-87] 80 (09/09 0741) Resp:  [18-20] 18 (09/09 0741) BP: (136-138)/(79-89) 137/89 (09/09 0741) SpO2:  [96 %-99 %] 98 % (09/09 0741)     Height: 6\' 3"  (190.5 cm) Weight: (!) 201.6 kg BMI (Calculated): 55.55   Intake/Output this shift:   Intake/Output Summary (Last 24 hours) at 11/06/2020 1445 Last data filed at 11/06/2020 1351 Gross per 24 hour  Intake 480 ml  Output 850 ml  Net -370 ml    Constitutional :  alert, cooperative, appears stated age, and no distress  Respiratory:  clear to auscultation bilaterally  Cardiovascular:  regular rate and rhythm  Gastrointestinal: Soft, no guarding. Midline wound dressing with scant bleeding, serosanguinous discharge on packing.  Opening with healthy granulation tissue.  Skin: Cool and moist.   Psychiatric: Normal affect, non-agitated, not confused       LABS:  CMP Latest Ref Rng & Units 11/02/2020 11/01/2020 10/31/2020  Glucose 70 - 99 mg/dL 12/31/2020) 532(D) 924(Q)  BUN 6 - 20 mg/dL 17 18 683(M)  Creatinine 0.61 - 1.24 mg/dL 19(Q 2.22 9.79  Sodium 135 - 145 mmol/L 138 138 141  Potassium 3.5 - 5.1 mmol/L 3.9 4.5 4.0  Chloride 98 - 111 mmol/L 99 99 102  CO2 22 - 32 mmol/L 32 32 34(H)  Calcium 8.9  - 10.3 mg/dL 8.92) 1.1(H) 4.1(D)  Total Protein 6.5 - 8.1 g/dL - - -  Total Bilirubin 0.3 - 1.2 mg/dL - - -  Alkaline Phos 38 - 126 U/L - - -  AST 15 - 41 U/L - - -  ALT 0 - 44 U/L - - -   CBC Latest Ref Rng & Units 11/02/2020 11/01/2020 10/31/2020  WBC 4.0 - 10.5 K/uL 7.5 8.3 8.1  Hemoglobin 13.0 - 17.0 g/dL 11.5(L) 12.0(L) 12.1(L)  Hematocrit 39.0 - 52.0 % 36.4(L) 38.9(L) 38.9(L)  Platelets 150 - 400 K/uL 359 394 422(H)    RADS: N/a Assessment:   S/p cecal perforation repair.  Now with old hematoma evacuation and open midline wound.  Wound continues to look clean with decreasing size of old hematoma cavity based on rough measurements with CTA.  Continue wet-to-dry dressing changes per wound care recs.  Surgery would continue to follow on a weekly basis while inhouse.  Call with any additional questions or concerns

## 2020-11-06 NOTE — Progress Notes (Signed)
Physical Therapy Treatment Patient Details Name: Christian [name] MRN: [id number] DOB: 1975-05-04 Today's Date: 11/06/2020    History of Present Illness Pt is a 45 y/o M who presented to ED on 10/09/20 with c/c of bilateral lower abdominal pain & c/o emesis twice that day after eating. Pt found to have abdominal wall purulent cellulitis. Pt admitted with clinical sepsis & AKI. PMH: morbid obesity, HTN. Pt with increased SOB starting 8/18 and CXR ordered with concern for free air. CT showed pneumoperitoneum suspicious for bowel perforation. Pt now s/p diagnostic laparoscopy converted to exploratory laparotomy, primary repair of cecal perforation and t/f to ICU on 8/19.    PT Comments    Pt alert, in recliner, agreeable for therapy. Pt continues to demonstrate excellent sit <> stand transfer technique for proper control & hand placement, RW, supervision for task set-up. Progressed amb distance this session, min-guard for safety, RW w/ SpO2 98% on 2L O2, HR 125 w/ amb. Pt does require rest breaks and demonstrates SOB around 80 ft of walking. Primary limitations remain decreased endurance and functional mobility necessary for ADLs. Skilled PT intervention is indicated to address deficits in function, mobility, and to return to PLOF as able.  Discharge recommendations remain SNF.   Follow Up Recommendations  SNF;Supervision/Assistance - 24 hour;Supervision for mobility/OOB     Equipment Recommendations  Other (comment);3in1 (PT);Rolling walker with 5" wheels    Recommendations for Other Services       Precautions / Restrictions Precautions Precautions: Fall Restrictions Weight Bearing Restrictions: No    Mobility  Bed Mobility               General bed mobility comments: Pt seated in recliner    Transfers Overall transfer level: Needs assistance Equipment used: Rolling walker (2 wheeled) Transfers: Sit to/from Stand Sit to Stand: Supervision         General transfer  comment: No cues for hand placement, excellent technique, supervision for set-up & increased time  Ambulation/Gait Ambulation/Gait assistance: Min guard Gait Distance (Feet): 195 Feet Assistive device: Rolling walker (2 wheeled) Gait Pattern/deviations: Step-through pattern;Trunk flexed Gait velocity: decreased   General Gait Details: 3 seated rest breaks   Stairs             Wheelchair Mobility    Modified Rankin (Stroke Patients Only)       Balance Overall balance assessment: Needs assistance Sitting-balance support: Feet supported;Bilateral upper extremity supported Sitting balance-[name]: Good       Standing balance-[name]: Fair Standing balance comment: Requires BUE, unable to tolerate moderaet challenge                            Cognition Arousal/Alertness: Awake/alert Behavior During Therapy: WFL for tasks assessed/performed Overall Cognitive Status: Within Functional Limits for tasks assessed                                        Exercises Other Exercises Other Exercises: STS x 3, supervision, RW    General Comments        Pertinent Vitals/Pain Pain Assessment: No/denies pain    Home Living                      Prior Function            PT Goals (current goals can now be  found in the care plan section) Progress towards PT goals: Progressing toward goals    Frequency    Min 2X/week      PT Plan Current plan remains appropriate    Co-evaluation              AM-PAC PT "6 Clicks" Mobility   Outcome Measure  Help needed turning from your back to your side while in a flat bed without using bedrails?: A Little Help needed moving from lying on your back to sitting on the side of a flat bed without using bedrails?: A Lot Help needed moving to and from a bed to a chair (including a wheelchair)?: A Little Help needed standing up from a chair using your arms (e.g., wheelchair or  bedside chair)?: A Little Help needed to walk in hospital room?: A Little Help needed climbing 3-5 steps with a railing? : Total 6 Click Score: 15    End of Session Equipment Utilized During Treatment: Oxygen;Gait belt Activity Tolerance: Patient tolerated treatment well Patient left: in chair;with call bell/phone within reach;with chair alarm set   PT Visit Diagnosis: Muscle weakness (generalized) (M62.81);Difficulty in walking, not elsewhere classified (R26.2)     Time: 1638-4536 PT Time Calculation (min) (ACUTE ONLY): 27 min  Charges:                        [employer], SPT

## 2020-11-06 NOTE — Consult Note (Addendum)
WOC Nurse Consult Note: Reason for Consult: Follow up on wounds previously addressed by my associate, D. Engels on 10/09/20. Also midline abdominal wound. Patient with high BMI, improving independence in bed mobility. He is able to assist with turning and repositioning today and pull himself up in the bed. The assistance of the Bedside RN, D. Jerene Canny is appreciated.  Wound type: Pressure, moisture, surgical Pressure Injury POA: Yes Measurement: Left Heel:  4cm x 3cm Unstageable thin layer of dry dark nonviable tissue at lateral aspect of heel. Initial presentation was a blister, now dry. Patient reports wearing Prevalon boot only occasionally.  It is reinforced today that the Prevalon boot corrects lateral rotation of the foot and will assist in tissue recovery.  The silicone foam dressing will continue and patient is to wear boot. Right heel: resolved, reabsorbed dry blister measuring 4cm x 3cm. Continue to protect with silicone foam. Bilateral buttock ulcers have resolved. Right posterior thigh wound, full thickness, Stage 3 (previously noted as Unstageable): 8cm x 16cm with frank bleeding, friable wound bed.  Will implement xeroform gauze as a wound contact layer for antimicrobial and nonadherent properties and top with xeroform. Previous wound contact layer (gauze) was adhering. Left posterior thigh:  linear excoriations, apparent scratches in a 4cm x 6cm area. Partial thickness. Dry. Midline surgical wound, full thickness:  8cm x 2cm with 2 distinct areas of depth measuring 4.5cm. Friable wound bed with serosanguinous exudate/bleeding. Wound bed is red, moist. Will implement twice daily NS cleansing and dressing with Saline moistened gauze used to fill the two defects.  Top with ABD bad and secure with tape. Subpannicular skin fold with caked powder.  Will implement InterDry skin fold management antimicrobial textile [employer] [place] # [phone]) Wound bed: As described above next to each wound  measurement. Drainage (amount, consistency, odor) As noted above Periwound: otherwise dry, cool to the touch at the buttocks, posterior thighs and back. Dressing procedure/placement/frequency: As noted above.  Patient will require oversight and likely assistance for filling of full thickness wound on abdomen and for dressing the posterior right thigh wound. Hygiene is a concern as patient's body habitus makes it difficult to perform incontinence care, perineal and skin fold care cleansing. Hair needs washing.  WOC nursing team will not follow, but will remain available to this patient, the nursing and medical teams.  Please re-consult if needed. Thanks, Ladona Mow, MSN, RN, GNP, [place]  Pager# [phone]

## 2020-11-06 NOTE — Progress Notes (Signed)
Patient ID: Christian [name], male   DOB: 29-Oct-1975, 45 y.o.   MRN: [id number]  PROGRESS NOTE    Lister Brizzi  XTK:[id number] DOB: 02/24/1976 DOA: 10/09/2020 PCP: Pcp, No   Brief Narrative:  45 y.o. male with a history of morbid obesity, hypertension presented with  lower abdominal pain with evidence of abdominal wall cellulitis and also found to have lower extremity cellulitis.  He was treated with antibiotics with improvement.  During the hospitalizing, he developed pneumoperitoneum with cecal perforation requiring surgical repair.  Patient was also found to be hypoxic and hypercapnic requiring BiPAP at night.  Patient is currently stable for discharge.   Assessment & Plan:   Pneumoperitoneum with cecal perforation -Underwent diagnostic laparoscopy converted to open laparotomy on 10/15/2020 with repair of bowel perforation.  Patient has completed IV antibiotic course.  General surgery has signed off.  Currently stable.  Abdominal wall cellulitis/lower extremity cellulitis -Has completed course of IV and oral antibiotics along with antifungals.  Severe sepsis: Present on admission; resolved. -Hemodynamically stable currently  Morbid obesity -Outpatient follow-up  Acute respiratory failure with hypoxia and hypercapnia -PCO2 up to 81 on ABG from 10/16/2020.  Currently requiring BiPAP at night.  No official diagnosis but possibly related to OSA/OHS.  Continue BiPAP nightly.  AKI -Resolved.  Weakness/generalized deconditioning -PT recommending SNF.  Social worker following.  Chronic normocytic anemia Iron deficiency anemia -Hemoglobin stable currently. -Continue iron supplementation  Poor social situation/homelessness -Child psychotherapist following  Various pressure injuries: Present on admission -I agree with pressure injury documentation as below.  Continue wound care. Pressure Injury 10/09/20 Buttocks Left Unstageable - Full thickness tissue loss in which the base of the injury  is covered by slough (yellow, tan, gray, green or brown) and/or eschar (tan, brown or black) in the wound bed. (Active)  10/09/20   Location: Buttocks  Location Orientation: Left  Staging: Unstageable - Full thickness tissue loss in which the base of the injury is covered by slough (yellow, tan, gray, green or brown) and/or eschar (tan, brown or black) in the wound bed.  Wound Description (Comments):   Present on Admission: Yes     Pressure Injury 10/09/20 Buttocks Right Unstageable - Full thickness tissue loss in which the base of the injury is covered by slough (yellow, tan, gray, green or brown) and/or eschar (tan, brown or black) in the wound bed. (Active)  10/09/20 1933  Location: Buttocks  Location Orientation: Right  Staging: Unstageable - Full thickness tissue loss in which the base of the injury is covered by slough (yellow, tan, gray, green or brown) and/or eschar (tan, brown or black) in the wound bed.  Wound Description (Comments):   Present on Admission: Yes     Pressure Injury 10/09/20 Heel Left Stage 2 -  Partial thickness loss of dermis presenting as a shallow open injury with a red, pink wound bed without slough. (Active)  10/09/20   Location: Heel  Location Orientation: Left  Staging: Stage 2 -  Partial thickness loss of dermis presenting as a shallow open injury with a red, pink wound bed without slough.  Wound Description (Comments):   Present on Admission: Yes     Pressure Injury 10/09/20 Heel Right Stage 2 -  Partial thickness loss of dermis presenting as a shallow open injury with a red, pink wound bed without slough. (Active)  10/09/20   Location: Heel  Location Orientation: Right  Staging: Stage 2 -  Partial thickness loss of dermis presenting as a shallow  open injury with a red, pink wound bed without slough.  Wound Description (Comments):   Present on Admission: Yes     Pressure Injury Foot Right Stage 2 -  Partial thickness loss of dermis presenting as a  shallow open injury with a red, pink wound bed without slough. (Active)     Location: Foot  Location Orientation: Right  Staging: Stage 2 -  Partial thickness loss of dermis presenting as a shallow open injury with a red, pink wound bed without slough.  Wound Description (Comments):   Present on Admission: Yes     Pressure Injury 10/22/20 Thigh Posterior;Proximal;Right Unstageable - Full thickness tissue loss in which the base of the injury is covered by slough (yellow, tan, gray, green or brown) and/or eschar (tan, brown or black) in the wound bed. (Active)  10/22/20 1055  Location: Thigh  Location Orientation: Posterior;Proximal;Right  Staging: Unstageable - Full thickness tissue loss in which the base of the injury is covered by slough (yellow, tan, gray, green or brown) and/or eschar (tan, brown or black) in the wound bed.  Wound Description (Comments):   Present on Admission: Yes (per handoff and report from other RN on unit- wound present on presentation to floor, patient reports wound present on admission- WOCN consulted)     DVT prophylaxis: Lovenox Code Status: Full Family Communication: None at bedside Disposition Plan: Status is: Inpatient  Remains inpatient appropriate because:Unsafe d/c plan  Dispo: The patient is from: Home              Anticipated d/c is to: SNF              Patient currently is medically stable to d/c.   Difficult to place patient Yes  Consultants: General surgery  Procedures: Diagnostic laparoscopy converted to open laparotomy with repair of bowel perforation  Antimicrobials:  Anti-infectives (From admission, onward)    Start     Dose/Rate Route Frequency Ordered Stop   10/17/20 1800  anidulafungin (ERAXIS) 100 mg in sodium chloride 0.9 % 100 mL IVPB  Status:  Discontinued       See Hyperspace for full Linked Orders Report.   100 mg 78 mL/hr over 100 Minutes Intravenous Every 24 hours 10/16/20 1706 10/18/20 1221   10/16/20 1800   anidulafungin (ERAXIS) 200 mg in sodium chloride 0.9 % 200 mL IVPB       See Hyperspace for full Linked Orders Report.   200 mg 78 mL/hr over 200 Minutes Intravenous  Once 10/16/20 1706 10/16/20 2141   10/15/20 2030  piperacillin-tazobactam (ZOSYN) IVPB 3.375 g        3.375 g 12.5 mL/hr over 240 Minutes Intravenous Every 8 hours 10/15/20 1925 10/19/20 2358   10/12/20 2200  cephALEXin (KEFLEX) capsule 1,000 mg  Status:  Discontinued        1,000 mg Oral Every 8 hours 10/12/20 1416 10/15/20 1917   10/12/20 1600  fluconazole (DIFLUCAN) tablet 200 mg        200 mg Oral Every 24 hours 10/12/20 1417 10/17/20 1559   10/11/20 1600  fluconazole (DIFLUCAN) tablet 400 mg  Status:  Discontinued        400 mg Oral Every 24 hours 10/10/20 1519 10/12/20 1417   10/10/20 1615  fluconazole (DIFLUCAN) tablet 800 mg        800 mg Oral  Once 10/10/20 1519 10/10/20 1641   10/10/20 1000  cefTRIAXone (ROCEPHIN) 2 g in sodium chloride 0.9 % 100 mL IVPB  Status:  Discontinued        2 g 200 mL/hr over 30 Minutes Intravenous Every 24 hours 10/09/20 1236 10/09/20 1408   10/10/20 0000  vancomycin (VANCOREADY) IVPB 1750 mg/350 mL  Status:  Discontinued        1,750 mg 175 mL/hr over 120 Minutes Intravenous Every 12 hours 10/09/20 1722 10/12/20 1415   10/09/20 1430  vancomycin (VANCOREADY) IVPB 1500 mg/300 mL  Status:  Discontinued        1,500 mg 150 mL/hr over 120 Minutes Intravenous  Once 10/09/20 1414 10/10/20 0120   10/09/20 1145  cefTRIAXone (ROCEPHIN) 1 g in sodium chloride 0.9 % 100 mL IVPB        1 g 200 mL/hr over 30 Minutes Intravenous  Once 10/09/20 1139 10/09/20 1356   10/09/20 1145  vancomycin (VANCOCIN) IVPB 1000 mg/200 mL premix        1,000 mg 200 mL/hr over 60 Minutes Intravenous  Once 10/09/20 1139 10/09/20 1656        Subjective: Patient seen and examined at bedside.  Poor historian.  No fever, chest pain or worsening shortness of breath reported. Objective: Vitals:   11/05/20 1556  11/05/20 2034 11/06/20 0458 11/06/20 0741  BP: 136/80 138/79 137/80 137/89  Pulse: 87 87 81 80  Resp: 18 20 18 18   Temp: 97.8 F (36.6 C) 97.8 F (36.6 C) 98.3 F (36.8 C) 98.1 F (36.7 C)  TempSrc: Oral Oral Axillary   SpO2: 96% 96% 99% 98%  Weight:      Height:        Intake/Output Summary (Last 24 hours) at 11/06/2020 0757 Last data filed at 11/05/2020 2335 Gross per 24 hour  Intake 960 ml  Output 900 ml  Net 60 ml    Filed Weights   10/09/20 0807 10/15/20 1600  Weight: (!) 197.3 kg (!) 201.6 kg    Examination:  General exam: Chronically ill looking.  No acute distress.  On BiPAP currently. Respiratory system: Bilateral decreased breath sounds at bases with scattered crackles  cardiovascular system: S1-S2 heard; rate controlled  gastrointestinal system: Abdomen is morbidly obese, distended mildly, soft and nontender.  Normal bowel sounds heard  extremities: No cyanosis; lower extremity edema present  Data Reviewed: I have personally reviewed following labs and imaging studies  CBC: Recent Labs  Lab 10/31/20 0520 11/01/20 0509 11/02/20 0539  WBC 8.1 8.3 7.5  HGB 12.1* 12.0* 11.5*  HCT 38.9* 38.9* 36.4*  MCV 83.3 84.2 82.5  PLT 422* 394 359    Basic Metabolic Panel: Recent Labs  Lab 10/31/20 0520 11/01/20 0509 11/02/20 0539  NA 141 138 138  K 4.0 4.5 3.9  CL 102 99 99  CO2 34* 32 32  GLUCOSE 112* 100* 108*  BUN 21* 18 17  CREATININE 0.93 0.89 0.85  CALCIUM 8.4* 8.6* 8.4*  MG 2.3  --   --   PHOS 5.1*  --   --     GFR: Estimated Creatinine Clearance: 201.7 mL/min (by C-G formula based on SCr of 0.85 mg/dL). Liver Function Tests: No results for input(s): AST, ALT, ALKPHOS, BILITOT, PROT, ALBUMIN in the last 168 hours. No results for input(s): LIPASE, AMYLASE in the last 168 hours. No results for input(s): AMMONIA in the last 168 hours. Coagulation Profile: No results for input(s): INR, PROTIME in the last 168 hours. Cardiac Enzymes: No results  for input(s): CKTOTAL, CKMB, CKMBINDEX, TROPONINI in the last 168 hours. BNP (last 3 results) No results for input(s): PROBNP in  the last 8760 hours. HbA1C: No results for input(s): HGBA1C in the last 72 hours. CBG: No results for input(s): GLUCAP in the last 168 hours.  Lipid Profile: No results for input(s): CHOL, HDL, LDLCALC, TRIG, CHOLHDL, LDLDIRECT in the last 72 hours. Thyroid Function Tests: No results for input(s): TSH, T4TOTAL, FREET4, T3FREE, THYROIDAB in the last 72 hours. Anemia Panel: No results for input(s): VITAMINB12, FOLATE, FERRITIN, TIBC, IRON, RETICCTPCT in the last 72 hours.  Sepsis Labs: No results for input(s): PROCALCITON, LATICACIDVEN in the last 168 hours.  No results found for this or any previous visit (from the past 240 hour(s)).       Radiology Studies: No results found.      Scheduled Meds:  vitamin C  500 mg Oral BID   collagenase   Topical Daily   enoxaparin (LOVENOX) injection  100 mg Subcutaneous Q24H   feeding supplement  237 mL Oral TID BM   folic acid  1 mg Oral Daily   furosemide  40 mg Oral Daily   hydrocerin   Topical BID   influenza vac split quadrivalent PF  0.5 mL Intramuscular Tomorrow-1000   iron polysaccharides  150 mg Oral Daily   multivitamin with minerals  1 tablet Oral Daily   potassium chloride  20 mEq Oral BID   vitamin B-12  100 mcg Oral Daily   Vitamin D (Ergocalciferol)  50,000 Units Oral Q7 days   Continuous Infusions:        Glade Lloyd, MD [facility] 11/06/2020, 7:57 AM

## 2020-11-06 NOTE — Progress Notes (Signed)
OT Cancellation Note  Patient Details Name: Christian [name] MRN: [id number] DOB: 1975/11/21   Cancelled Treatment:    Reason Eval/Treat Not Completed: Patient declined, no reason specified. OT continues to follow pt for ongoing therapy services. Upon attempt for tx session this date, pt seated in room recliner with lunch tray. Pt politely declines OT services at this time, and requests OT at a later time after he is finished with his lunch. Will hold at this time and re-attempt at a later time/date as available and pt medically appropriate for OT tx session.   Rockney Ghee, M.S., OTR/L Ascom: [phone] 11/06/20, 1:09 PM

## 2020-11-07 NOTE — Progress Notes (Signed)
Patient ID: Christian [name], male   DOB: 20-Aug-1975, 45 y.o.   MRN: [id number]  PROGRESS NOTE    Leopold Smyers  [id number]:[id number] DOB: 1975/05/19 DOA: 10/09/2020 PCP: Pcp, No   Brief Narrative:  45 y.o. male with a history of morbid obesity, hypertension presented with  lower abdominal pain with evidence of abdominal wall cellulitis and also found to have lower extremity cellulitis.  He was treated with antibiotics with improvement.  During the hospitalization, he developed pneumoperitoneum with cecal perforation requiring surgical repair.  Patient was also found to be hypoxic and hypercapnic requiring BiPAP at night.  Patient is currently stable for discharge.   Assessment & Plan:   Pneumoperitoneum with cecal perforation -Underwent diagnostic laparoscopy converted to open laparotomy on 10/15/2020 with repair of bowel perforation.  Patient has completed IV antibiotic course.  General surgery following intermittently for wound care.  Currently stable.  Abdominal wall cellulitis/lower extremity cellulitis -Has completed course of IV and oral antibiotics along with antifungals.  Severe sepsis: Present on admission; resolved. -Hemodynamically stable currently  Morbid obesity -Outpatient follow-up  Acute respiratory failure with hypoxia and hypercapnia -PCO2 up to 81 on ABG from 10/16/2020.  Currently requiring BiPAP at night.  No official diagnosis but possibly related to OSA/OHS.  Continue BiPAP nightly.  AKI -Resolved.  Weakness/generalized deconditioning -PT recommending SNF.  Social worker following.  Chronic normocytic anemia Iron deficiency anemia -Hemoglobin stable currently. -Continue iron supplementation  Poor social situation/homelessness -Child psychotherapist following  Various pressure injuries: Present on admission -I agree with pressure injury documentation as below.  Continue wound care. Pressure Injury 10/09/20 Buttocks Right Unstageable - Full thickness tissue loss in  which the base of the injury is covered by slough (yellow, tan, gray, green or brown) and/or eschar (tan, brown or black) in the wound bed. (Active)  10/09/20 1933  Location: Buttocks  Location Orientation: Right  Staging: Unstageable - Full thickness tissue loss in which the base of the injury is covered by slough (yellow, tan, gray, green or brown) and/or eschar (tan, brown or black) in the wound bed.  Wound Description (Comments):   Present on Admission: Yes     Pressure Injury 10/09/20 Heel Left Stage 2 -  Partial thickness loss of dermis presenting as a shallow open injury with a red, pink wound bed without slough. (Active)  10/09/20   Location: Heel  Location Orientation: Left  Staging: Stage 2 -  Partial thickness loss of dermis presenting as a shallow open injury with a red, pink wound bed without slough.  Wound Description (Comments):   Present on Admission: Yes     Pressure Injury 10/22/20 Thigh Posterior;Proximal;Right Unstageable - Full thickness tissue loss in which the base of the injury is covered by slough (yellow, tan, gray, green or brown) and/or eschar (tan, brown or black) in the wound bed. (Active)  10/22/20 1055  Location: Thigh  Location Orientation: Posterior;Proximal;Right  Staging: Unstageable - Full thickness tissue loss in which the base of the injury is covered by slough (yellow, tan, gray, green or brown) and/or eschar (tan, brown or black) in the wound bed.  Wound Description (Comments):   Present on Admission: Yes (per handoff and report from other RN on unit- wound present on presentation to floor, patient reports wound present on admission- WOCN consulted)     DVT prophylaxis: Lovenox Code Status: Full Family Communication: None at bedside Disposition Plan: Status is: Inpatient  Remains inpatient appropriate because:Unsafe d/c plan  Dispo: The patient is from:  Home              Anticipated d/c is to: SNF              Patient currently is medically  stable to d/c.   Difficult to place patient Yes  Consultants: General surgery  Procedures: Diagnostic laparoscopy converted to open laparotomy with repair of bowel perforation  Antimicrobials:  Anti-infectives (From admission, onward)    Start     Dose/Rate Route Frequency Ordered Stop   10/17/20 1800  anidulafungin (ERAXIS) 100 mg in sodium chloride 0.9 % 100 mL IVPB  Status:  Discontinued       See Hyperspace for full Linked Orders Report.   100 mg 78 mL/hr over 100 Minutes Intravenous Every 24 hours 10/16/20 1706 10/18/20 1221   10/16/20 1800  anidulafungin (ERAXIS) 200 mg in sodium chloride 0.9 % 200 mL IVPB       See Hyperspace for full Linked Orders Report.   200 mg 78 mL/hr over 200 Minutes Intravenous  Once 10/16/20 1706 10/16/20 2141   10/15/20 2030  piperacillin-tazobactam (ZOSYN) IVPB 3.375 g        3.375 g 12.5 mL/hr over 240 Minutes Intravenous Every 8 hours 10/15/20 1925 10/19/20 2358   10/12/20 2200  cephALEXin (KEFLEX) capsule 1,000 mg  Status:  Discontinued        1,000 mg Oral Every 8 hours 10/12/20 1416 10/15/20 1917   10/12/20 1600  fluconazole (DIFLUCAN) tablet 200 mg        200 mg Oral Every 24 hours 10/12/20 1417 10/17/20 1559   10/11/20 1600  fluconazole (DIFLUCAN) tablet 400 mg  Status:  Discontinued        400 mg Oral Every 24 hours 10/10/20 1519 10/12/20 1417   10/10/20 1615  fluconazole (DIFLUCAN) tablet 800 mg        800 mg Oral  Once 10/10/20 1519 10/10/20 1641   10/10/20 1000  cefTRIAXone (ROCEPHIN) 2 g in sodium chloride 0.9 % 100 mL IVPB  Status:  Discontinued        2 g 200 mL/hr over 30 Minutes Intravenous Every 24 hours 10/09/20 1236 10/09/20 1408   10/10/20 0000  vancomycin (VANCOREADY) IVPB 1750 mg/350 mL  Status:  Discontinued        1,750 mg 175 mL/hr over 120 Minutes Intravenous Every 12 hours 10/09/20 1722 10/12/20 1415   10/09/20 1430  vancomycin (VANCOREADY) IVPB 1500 mg/300 mL  Status:  Discontinued        1,500 mg 150 mL/hr over 120  Minutes Intravenous  Once 10/09/20 1414 10/10/20 0120   10/09/20 1145  cefTRIAXone (ROCEPHIN) 1 g in sodium chloride 0.9 % 100 mL IVPB        1 g 200 mL/hr over 30 Minutes Intravenous  Once 10/09/20 1139 10/09/20 1356   10/09/20 1145  vancomycin (VANCOCIN) IVPB 1000 mg/200 mL premix        1,000 mg 200 mL/hr over 60 Minutes Intravenous  Once 10/09/20 1139 10/09/20 1656        Subjective: Patient seen and examined at bedside.  Poor historian.  No vomiting, fever, agitation reported.   Objective: Vitals:   11/06/20 1951 11/07/20 0029 11/07/20 0503 11/07/20 0803  BP: 140/80  121/74 127/76  Pulse: 85 81 70 80  Resp: 16  17 20   Temp: 98.1 F (36.7 C)  98 F (36.7 C) 97.8 F (36.6 C)  TempSrc:   Oral Oral  SpO2: 96% 98% 100% 96%  Weight:  Height:        Intake/Output Summary (Last 24 hours) at 11/07/2020 0817 Last data filed at 11/07/2020 0534 Gross per 24 hour  Intake 480 ml  Output 1100 ml  Net -620 ml    Filed Weights   10/09/20 0807 10/15/20 1600  Weight: (!) 197.3 kg (!) 201.6 kg    Examination:  General exam: No distress.  Looks chronically ill and deconditioned.  Required BiPAP overnight. Respiratory system: Decreased breath sounds at bases bilaterally with some crackles  cardiovascular system: Rate controlled, S1 S2 heard gastrointestinal system: Abdomen is morbidly obese, slightly distended, soft and nontender.  Midline dressing present.  Bowel sounds are heard  extremities: Bilateral lower extremity edema present; no clubbing  Data Reviewed: I have personally reviewed following labs and imaging studies  CBC: Recent Labs  Lab 11/01/20 0509 11/02/20 0539  WBC 8.3 7.5  HGB 12.0* 11.5*  HCT 38.9* 36.4*  MCV 84.2 82.5  PLT 394 359    Basic Metabolic Panel: Recent Labs  Lab 11/01/20 0509 11/02/20 0539  NA 138 138  K 4.5 3.9  CL 99 99  CO2 32 32  GLUCOSE 100* 108*  BUN 18 17  CREATININE 0.89 0.85  CALCIUM 8.6* 8.4*    GFR: Estimated  Creatinine Clearance: 201.7 mL/min (by C-G formula based on SCr of 0.85 mg/dL). Liver Function Tests: No results for input(s): AST, ALT, ALKPHOS, BILITOT, PROT, ALBUMIN in the last 168 hours. No results for input(s): LIPASE, AMYLASE in the last 168 hours. No results for input(s): AMMONIA in the last 168 hours. Coagulation Profile: No results for input(s): INR, PROTIME in the last 168 hours. Cardiac Enzymes: No results for input(s): CKTOTAL, CKMB, CKMBINDEX, TROPONINI in the last 168 hours. BNP (last 3 results) No results for input(s): PROBNP in the last 8760 hours. HbA1C: No results for input(s): HGBA1C in the last 72 hours. CBG: No results for input(s): GLUCAP in the last 168 hours.  Lipid Profile: No results for input(s): CHOL, HDL, LDLCALC, TRIG, CHOLHDL, LDLDIRECT in the last 72 hours. Thyroid Function Tests: No results for input(s): TSH, T4TOTAL, FREET4, T3FREE, THYROIDAB in the last 72 hours. Anemia Panel: No results for input(s): VITAMINB12, FOLATE, FERRITIN, TIBC, IRON, RETICCTPCT in the last 72 hours.  Sepsis Labs: No results for input(s): PROCALCITON, LATICACIDVEN in the last 168 hours.  No results found for this or any previous visit (from the past 240 hour(s)).       Radiology Studies: No results found.      Scheduled Meds:  vitamin C  500 mg Oral BID   collagenase   Topical Daily   enoxaparin (LOVENOX) injection  100 mg Subcutaneous Q24H   feeding supplement  237 mL Oral TID BM   folic acid  1 mg Oral Daily   furosemide  40 mg Oral Daily   hydrocerin   Topical BID   influenza vac split quadrivalent PF  0.5 mL Intramuscular Tomorrow-1000   iron polysaccharides  150 mg Oral Daily   multivitamin with minerals  1 tablet Oral Daily   potassium chloride  20 mEq Oral BID   vitamin B-12  100 mcg Oral Daily   Vitamin D (Ergocalciferol)  50,000 Units Oral Q7 days   Continuous Infusions:        Glade Lloyd, MD [facility] 11/07/2020, 8:17 AM

## 2020-11-07 NOTE — Progress Notes (Signed)
Dressing change done to midline abdominal incision and right posterior thigh per order. Pt tolerated with no problems.

## 2020-11-08 NOTE — Progress Notes (Signed)
Patient ID: Christian [name], male   DOB: 20-Aug-1975, 45 y.o.   MRN: [id number]  PROGRESS NOTE    Christian [name]  HGD:[id number] DOB: 1975/05/19 DOA: 10/09/2020 PCP: Pcp, No   Brief Narrative:  45 y.o. male with a history of morbid obesity, hypertension presented with  lower abdominal pain with evidence of abdominal wall cellulitis and also found to have lower extremity cellulitis.  He was treated with antibiotics with improvement.  During the hospitalization, he developed pneumoperitoneum with cecal perforation requiring surgical repair.  Patient was also found to be hypoxic and hypercapnic requiring BiPAP at night.  Patient is currently stable for discharge.   Assessment & Plan:   Pneumoperitoneum with cecal perforation -Underwent diagnostic laparoscopy converted to open laparotomy on 10/15/2020 with repair of bowel perforation.  Patient has completed IV antibiotic course.  General surgery following intermittently for wound care.  Currently stable.  Abdominal wall cellulitis/lower extremity cellulitis -Has completed course of IV and oral antibiotics along with antifungals.  Severe sepsis: Present on admission; resolved. -Hemodynamically stable currently  Morbid obesity -Outpatient follow-up  Acute respiratory failure with hypoxia and hypercapnia -PCO2 up to 81 on ABG from 10/16/2020.  Currently requiring BiPAP at night.  No official diagnosis but possibly related to OSA/OHS.  Continue BiPAP nightly.  AKI -Resolved.  Weakness/generalized deconditioning -PT recommending SNF.  Social worker following.  Chronic normocytic anemia Iron deficiency anemia -Hemoglobin stable currently. -Continue iron supplementation  Poor social situation/homelessness -Child psychotherapist following  Various pressure injuries: Present on admission -I agree with pressure injury documentation as below.  Continue wound care. Pressure Injury 10/09/20 Buttocks Right Unstageable - Full thickness tissue loss in  which the base of the injury is covered by slough (yellow, tan, gray, green or brown) and/or eschar (tan, brown or black) in the wound bed. (Active)  10/09/20 1933  Location: Buttocks  Location Orientation: Right  Staging: Unstageable - Full thickness tissue loss in which the base of the injury is covered by slough (yellow, tan, gray, green or brown) and/or eschar (tan, brown or black) in the wound bed.  Wound Description (Comments):   Present on Admission: Yes     Pressure Injury 10/09/20 Heel Left Stage 2 -  Partial thickness loss of dermis presenting as a shallow open injury with a red, pink wound bed without slough. (Active)  10/09/20   Location: Heel  Location Orientation: Left  Staging: Stage 2 -  Partial thickness loss of dermis presenting as a shallow open injury with a red, pink wound bed without slough.  Wound Description (Comments):   Present on Admission: Yes     Pressure Injury 10/22/20 Thigh Posterior;Proximal;Right Unstageable - Full thickness tissue loss in which the base of the injury is covered by slough (yellow, tan, gray, green or brown) and/or eschar (tan, brown or black) in the wound bed. (Active)  10/22/20 1055  Location: Thigh  Location Orientation: Posterior;Proximal;Right  Staging: Unstageable - Full thickness tissue loss in which the base of the injury is covered by slough (yellow, tan, gray, green or brown) and/or eschar (tan, brown or black) in the wound bed.  Wound Description (Comments):   Present on Admission: Yes (per handoff and report from other RN on unit- wound present on presentation to floor, patient reports wound present on admission- WOCN consulted)     DVT prophylaxis: Lovenox Code Status: Full Family Communication: None at bedside Disposition Plan: Status is: Inpatient  Remains inpatient appropriate because:Unsafe d/c plan  Dispo: The patient is from:  Home              Anticipated d/c is to: SNF              Patient currently is medically  stable to d/c.   Difficult to place patient Yes  Consultants: General surgery  Procedures: Diagnostic laparoscopy converted to open laparotomy with repair of bowel perforation  Antimicrobials:  Anti-infectives (From admission, onward)    Start     Dose/Rate Route Frequency Ordered Stop   10/17/20 1800  anidulafungin (ERAXIS) 100 mg in sodium chloride 0.9 % 100 mL IVPB  Status:  Discontinued       See Hyperspace for full Linked Orders Report.   100 mg 78 mL/hr over 100 Minutes Intravenous Every 24 hours 10/16/20 1706 10/18/20 1221   10/16/20 1800  anidulafungin (ERAXIS) 200 mg in sodium chloride 0.9 % 200 mL IVPB       See Hyperspace for full Linked Orders Report.   200 mg 78 mL/hr over 200 Minutes Intravenous  Once 10/16/20 1706 10/16/20 2141   10/15/20 2030  piperacillin-tazobactam (ZOSYN) IVPB 3.375 g        3.375 g 12.5 mL/hr over 240 Minutes Intravenous Every 8 hours 10/15/20 1925 10/19/20 2358   10/12/20 2200  cephALEXin (KEFLEX) capsule 1,000 mg  Status:  Discontinued        1,000 mg Oral Every 8 hours 10/12/20 1416 10/15/20 1917   10/12/20 1600  fluconazole (DIFLUCAN) tablet 200 mg        200 mg Oral Every 24 hours 10/12/20 1417 10/17/20 1559   10/11/20 1600  fluconazole (DIFLUCAN) tablet 400 mg  Status:  Discontinued        400 mg Oral Every 24 hours 10/10/20 1519 10/12/20 1417   10/10/20 1615  fluconazole (DIFLUCAN) tablet 800 mg        800 mg Oral  Once 10/10/20 1519 10/10/20 1641   10/10/20 1000  cefTRIAXone (ROCEPHIN) 2 g in sodium chloride 0.9 % 100 mL IVPB  Status:  Discontinued        2 g 200 mL/hr over 30 Minutes Intravenous Every 24 hours 10/09/20 1236 10/09/20 1408   10/10/20 0000  vancomycin (VANCOREADY) IVPB 1750 mg/350 mL  Status:  Discontinued        1,750 mg 175 mL/hr over 120 Minutes Intravenous Every 12 hours 10/09/20 1722 10/12/20 1415   10/09/20 1430  vancomycin (VANCOREADY) IVPB 1500 mg/300 mL  Status:  Discontinued        1,500 mg 150 mL/hr over 120  Minutes Intravenous  Once 10/09/20 1414 10/10/20 0120   10/09/20 1145  cefTRIAXone (ROCEPHIN) 1 g in sodium chloride 0.9 % 100 mL IVPB        1 g 200 mL/hr over 30 Minutes Intravenous  Once 10/09/20 1139 10/09/20 1356   10/09/20 1145  vancomycin (VANCOCIN) IVPB 1000 mg/200 mL premix        1,000 mg 200 mL/hr over 60 Minutes Intravenous  Once 10/09/20 1139 10/09/20 1656        Subjective: Patient seen and examined at bedside.  Poor historian.  No fever, chest pain or vomiting reported.   Objective: Vitals:   11/07/20 0803 11/07/20 1550 11/07/20 1900 11/08/20 0300  BP: 127/76 (!) 145/85 136/88 (!) 142/88  Pulse: 80 94 86 78  Resp: 20 20 20 20   Temp: 97.8 F (36.6 C) (!) 97.5 F (36.4 C) 97.6 F (36.4 C) 98.3 F (36.8 C)  TempSrc: Oral Oral Oral Oral  SpO2: 96% 95% 95% 95%  Weight:      Height:        Intake/Output Summary (Last 24 hours) at 11/08/2020 0813 Last data filed at 11/08/2020 0100 Gross per 24 hour  Intake 840 ml  Output 1200 ml  Net -360 ml    Filed Weights   10/09/20 0807 10/15/20 1600  Weight: (!) 197.3 kg (!) 201.6 kg    Examination:  General exam: Currently on 2 L oxygen via nasal.  No acute distress.  Looks chronically ill and deconditioned.   Respiratory system: Bilateral decreased breath sounds at bases with scattered crackles  cardiovascular system: S1-S2 heard; rate controlled  gastrointestinal system: Abdomen is morbidly obese, distended mildly, soft and nontender.  Midline dressing present.  Normal bowel sounds present  extremities: No cyanosis; lower extremity edema present bilaterally  Data Reviewed: I have personally reviewed following labs and imaging studies  CBC: Recent Labs  Lab 11/02/20 0539  WBC 7.5  HGB 11.5*  HCT 36.4*  MCV 82.5  PLT 359    Basic Metabolic Panel: Recent Labs  Lab 11/02/20 0539  NA 138  K 3.9  CL 99  CO2 32  GLUCOSE 108*  BUN 17  CREATININE 0.85  CALCIUM 8.4*    GFR: Estimated Creatinine  Clearance: 201.7 mL/min (by C-G formula based on SCr of 0.85 mg/dL). Liver Function Tests: No results for input(s): AST, ALT, ALKPHOS, BILITOT, PROT, ALBUMIN in the last 168 hours. No results for input(s): LIPASE, AMYLASE in the last 168 hours. No results for input(s): AMMONIA in the last 168 hours. Coagulation Profile: No results for input(s): INR, PROTIME in the last 168 hours. Cardiac Enzymes: No results for input(s): CKTOTAL, CKMB, CKMBINDEX, TROPONINI in the last 168 hours. BNP (last 3 results) No results for input(s): PROBNP in the last 8760 hours. HbA1C: No results for input(s): HGBA1C in the last 72 hours. CBG: No results for input(s): GLUCAP in the last 168 hours.  Lipid Profile: No results for input(s): CHOL, HDL, LDLCALC, TRIG, CHOLHDL, LDLDIRECT in the last 72 hours. Thyroid Function Tests: No results for input(s): TSH, T4TOTAL, FREET4, T3FREE, THYROIDAB in the last 72 hours. Anemia Panel: No results for input(s): VITAMINB12, FOLATE, FERRITIN, TIBC, IRON, RETICCTPCT in the last 72 hours.  Sepsis Labs: No results for input(s): PROCALCITON, LATICACIDVEN in the last 168 hours.  No results found for this or any previous visit (from the past 240 hour(s)).       Radiology Studies: No results found.      Scheduled Meds:  vitamin C  500 mg Oral BID   collagenase   Topical Daily   enoxaparin (LOVENOX) injection  100 mg Subcutaneous Q24H   feeding supplement  237 mL Oral TID BM   folic acid  1 mg Oral Daily   furosemide  40 mg Oral Daily   hydrocerin   Topical BID   iron polysaccharides  150 mg Oral Daily   multivitamin with minerals  1 tablet Oral Daily   potassium chloride  20 mEq Oral BID   vitamin B-12  100 mcg Oral Daily   Vitamin D (Ergocalciferol)  50,000 Units Oral Q7 days   Continuous Infusions:        Glade Lloyd, MD [facility] 11/08/2020, 8:13 AM

## 2020-11-08 NOTE — Progress Notes (Signed)
Wound care completed to abdomen. Dsg changed to both heels. Pt tolerated dsg change with no problems.

## 2020-11-09 NOTE — Progress Notes (Signed)
Patient ID: Christian [name], male   DOB: 20-Aug-1975, 45 y.o.   MRN: [id number]  PROGRESS NOTE    Christian [name]  HGD:[id number] DOB: 1975/05/19 DOA: 10/09/2020 PCP: Pcp, No   Brief Narrative:  45 y.o. male with a history of morbid obesity, hypertension presented with  lower abdominal pain with evidence of abdominal wall cellulitis and also found to have lower extremity cellulitis.  He was treated with antibiotics with improvement.  During the hospitalization, he developed pneumoperitoneum with cecal perforation requiring surgical repair.  Patient was also found to be hypoxic and hypercapnic requiring BiPAP at night.  Patient is currently stable for discharge.   Assessment & Plan:   Pneumoperitoneum with cecal perforation -Underwent diagnostic laparoscopy converted to open laparotomy on 10/15/2020 with repair of bowel perforation.  Patient has completed IV antibiotic course.  General surgery following intermittently for wound care.  Currently stable.  Abdominal wall cellulitis/lower extremity cellulitis -Has completed course of IV and oral antibiotics along with antifungals.  Severe sepsis: Present on admission; resolved. -Hemodynamically stable currently  Morbid obesity -Outpatient follow-up  Acute respiratory failure with hypoxia and hypercapnia -PCO2 up to 81 on ABG from 10/16/2020.  Currently requiring BiPAP at night.  No official diagnosis but possibly related to OSA/OHS.  Continue BiPAP nightly.  AKI -Resolved.  Weakness/generalized deconditioning -PT recommending SNF.  Social worker following.  Chronic normocytic anemia Iron deficiency anemia -Hemoglobin stable currently. -Continue iron supplementation  Poor social situation/homelessness -Child psychotherapist following  Various pressure injuries: Present on admission -I agree with pressure injury documentation as below.  Continue wound care. Pressure Injury 10/09/20 Buttocks Right Unstageable - Full thickness tissue loss in  which the base of the injury is covered by slough (yellow, tan, gray, green or brown) and/or eschar (tan, brown or black) in the wound bed. (Active)  10/09/20 1933  Location: Buttocks  Location Orientation: Right  Staging: Unstageable - Full thickness tissue loss in which the base of the injury is covered by slough (yellow, tan, gray, green or brown) and/or eschar (tan, brown or black) in the wound bed.  Wound Description (Comments):   Present on Admission: Yes     Pressure Injury 10/09/20 Heel Left Stage 2 -  Partial thickness loss of dermis presenting as a shallow open injury with a red, pink wound bed without slough. (Active)  10/09/20   Location: Heel  Location Orientation: Left  Staging: Stage 2 -  Partial thickness loss of dermis presenting as a shallow open injury with a red, pink wound bed without slough.  Wound Description (Comments):   Present on Admission: Yes     Pressure Injury 10/22/20 Thigh Posterior;Proximal;Right Unstageable - Full thickness tissue loss in which the base of the injury is covered by slough (yellow, tan, gray, green or brown) and/or eschar (tan, brown or black) in the wound bed. (Active)  10/22/20 1055  Location: Thigh  Location Orientation: Posterior;Proximal;Right  Staging: Unstageable - Full thickness tissue loss in which the base of the injury is covered by slough (yellow, tan, gray, green or brown) and/or eschar (tan, brown or black) in the wound bed.  Wound Description (Comments):   Present on Admission: Yes (per handoff and report from other RN on unit- wound present on presentation to floor, patient reports wound present on admission- WOCN consulted)     DVT prophylaxis: Lovenox Code Status: Full Family Communication: None at bedside Disposition Plan: Status is: Inpatient  Remains inpatient appropriate because:Unsafe d/c plan  Dispo: The patient is from:  Home              Anticipated d/c is to: SNF              Patient currently is medically  stable to d/c.   Difficult to place patient Yes  Consultants: General surgery  Procedures: Diagnostic laparoscopy converted to open laparotomy with repair of bowel perforation  Antimicrobials:  Anti-infectives (From admission, onward)    Start     Dose/Rate Route Frequency Ordered Stop   10/17/20 1800  anidulafungin (ERAXIS) 100 mg in sodium chloride 0.9 % 100 mL IVPB  Status:  Discontinued       See Hyperspace for full Linked Orders Report.   100 mg 78 mL/hr over 100 Minutes Intravenous Every 24 hours 10/16/20 1706 10/18/20 1221   10/16/20 1800  anidulafungin (ERAXIS) 200 mg in sodium chloride 0.9 % 200 mL IVPB       See Hyperspace for full Linked Orders Report.   200 mg 78 mL/hr over 200 Minutes Intravenous  Once 10/16/20 1706 10/16/20 2141   10/15/20 2030  piperacillin-tazobactam (ZOSYN) IVPB 3.375 g        3.375 g 12.5 mL/hr over 240 Minutes Intravenous Every 8 hours 10/15/20 1925 10/19/20 2358   10/12/20 2200  cephALEXin (KEFLEX) capsule 1,000 mg  Status:  Discontinued        1,000 mg Oral Every 8 hours 10/12/20 1416 10/15/20 1917   10/12/20 1600  fluconazole (DIFLUCAN) tablet 200 mg        200 mg Oral Every 24 hours 10/12/20 1417 10/17/20 1559   10/11/20 1600  fluconazole (DIFLUCAN) tablet 400 mg  Status:  Discontinued        400 mg Oral Every 24 hours 10/10/20 1519 10/12/20 1417   10/10/20 1615  fluconazole (DIFLUCAN) tablet 800 mg        800 mg Oral  Once 10/10/20 1519 10/10/20 1641   10/10/20 1000  cefTRIAXone (ROCEPHIN) 2 g in sodium chloride 0.9 % 100 mL IVPB  Status:  Discontinued        2 g 200 mL/hr over 30 Minutes Intravenous Every 24 hours 10/09/20 1236 10/09/20 1408   10/10/20 0000  vancomycin (VANCOREADY) IVPB 1750 mg/350 mL  Status:  Discontinued        1,750 mg 175 mL/hr over 120 Minutes Intravenous Every 12 hours 10/09/20 1722 10/12/20 1415   10/09/20 1430  vancomycin (VANCOREADY) IVPB 1500 mg/300 mL  Status:  Discontinued        1,500 mg 150 mL/hr over 120  Minutes Intravenous  Once 10/09/20 1414 10/10/20 0120   10/09/20 1145  cefTRIAXone (ROCEPHIN) 1 g in sodium chloride 0.9 % 100 mL IVPB        1 g 200 mL/hr over 30 Minutes Intravenous  Once 10/09/20 1139 10/09/20 1356   10/09/20 1145  vancomycin (VANCOCIN) IVPB 1000 mg/200 mL premix        1,000 mg 200 mL/hr over 60 Minutes Intravenous  Once 10/09/20 1139 10/09/20 1656        Subjective: Patient seen and examined at bedside.  Poor historian.  No chest pain, vomiting or fever reported.   Objective: Vitals:   11/08/20 0300 11/08/20 0845 11/08/20 2133 11/09/20 0405  BP: (!) 142/88 122/73 117/85 121/72  Pulse: 78 78 92 79  Resp: 20 16 18 16   Temp: 98.3 F (36.8 C) 98.1 F (36.7 C) 98.5 F (36.9 C) 98.5 F (36.9 C)  TempSrc: Oral  Oral   SpO2:  95% 100% 97% 100%  Weight:      Height:        Intake/Output Summary (Last 24 hours) at 11/09/2020 0806 Last data filed at 11/09/2020 0410 Gross per 24 hour  Intake --  Output 1225 ml  Net -1225 ml    Filed Weights   10/09/20 0807 10/15/20 1600  Weight: (!) 197.3 kg (!) 201.6 kg    Examination:  General exam: No distress.  On 2 L oxygen via nasal cannula.  Looks chronically ill and deconditioned.   Respiratory system: Decreased breath sounds at bases bilaterally with some crackles  cardiovascular system: Rate controlled, S1-S2 heard gastrointestinal system: Abdomen is morbidly obese, still distended, soft and nontender.  Midline dressing present.  Bowel sounds are heard extremities: Lower extremity edema present bilaterally; no clubbing  Data Reviewed: I have personally reviewed following labs and imaging studies  CBC: No results for input(s): WBC, NEUTROABS, HGB, HCT, MCV, PLT in the last 168 hours.  Basic Metabolic Panel: No results for input(s): NA, K, CL, CO2, GLUCOSE, BUN, CREATININE, CALCIUM, MG, PHOS in the last 168 hours.  GFR: Estimated Creatinine Clearance: 201.7 mL/min (by C-G formula based on SCr of 0.85  mg/dL). Liver Function Tests: No results for input(s): AST, ALT, ALKPHOS, BILITOT, PROT, ALBUMIN in the last 168 hours. No results for input(s): LIPASE, AMYLASE in the last 168 hours. No results for input(s): AMMONIA in the last 168 hours. Coagulation Profile: No results for input(s): INR, PROTIME in the last 168 hours. Cardiac Enzymes: No results for input(s): CKTOTAL, CKMB, CKMBINDEX, TROPONINI in the last 168 hours. BNP (last 3 results) No results for input(s): PROBNP in the last 8760 hours. HbA1C: No results for input(s): HGBA1C in the last 72 hours. CBG: No results for input(s): GLUCAP in the last 168 hours.  Lipid Profile: No results for input(s): CHOL, HDL, LDLCALC, TRIG, CHOLHDL, LDLDIRECT in the last 72 hours. Thyroid Function Tests: No results for input(s): TSH, T4TOTAL, FREET4, T3FREE, THYROIDAB in the last 72 hours. Anemia Panel: No results for input(s): VITAMINB12, FOLATE, FERRITIN, TIBC, IRON, RETICCTPCT in the last 72 hours.  Sepsis Labs: No results for input(s): PROCALCITON, LATICACIDVEN in the last 168 hours.  No results found for this or any previous visit (from the past 240 hour(s)).       Radiology Studies: No results found.      Scheduled Meds:  vitamin C  500 mg Oral BID   collagenase   Topical Daily   enoxaparin (LOVENOX) injection  100 mg Subcutaneous Q24H   feeding supplement  237 mL Oral TID BM   folic acid  1 mg Oral Daily   furosemide  40 mg Oral Daily   hydrocerin   Topical BID   iron polysaccharides  150 mg Oral Daily   multivitamin with minerals  1 tablet Oral Daily   potassium chloride  20 mEq Oral BID   vitamin B-12  100 mcg Oral Daily   Vitamin D (Ergocalciferol)  50,000 Units Oral Q7 days   Continuous Infusions:        Glade Lloyd, MD [facility] 11/09/2020, 8:06 AM

## 2020-11-09 NOTE — Progress Notes (Signed)
Occupational Therapy Treatment Patient Details Name: Christian [name] MRN: [id number] DOB: 1975/10/04 Today's Date: 11/09/2020   History of present illness Pt is a 45 y/o M who presented to ED on 10/09/20 with c/c of bilateral lower abdominal pain & c/o emesis twice that day after eating. Pt found to have abdominal wall purulent cellulitis. Pt admitted with clinical sepsis & AKI. PMH: morbid obesity, HTN. Pt with increased SOB starting 8/18 and CXR ordered with concern for free air. CT showed pneumoperitoneum suspicious for bowel perforation. Pt now s/p diagnostic laparoscopy converted to exploratory laparotomy, primary repair of cecal perforation and t/f to ICU on 8/19.   OT comments  Pt seen for OT tx this date. Pt in recliner, agreeable to therapy. Pt set up with urinal and towel on floor underneath in case of spillage. Pt able to hold the urinal himself. Pt stood for grooming tasks within RW frame and with tray table elevated and in front of him. Able to perform with set up and supervision-CGA. Noted with worsening balance with no UE support on RW. PRN VC for PLB. Pt educated in ECS, incentive spirometer, and flutter valve use. Pt verbalized understanding of ECS and able to return demo proper technique for IS and flutter valve after instruction. Pt instructed in AE for LB ADL and pericare. Pt continues to benefit from skilled OT services to addressed ongoing limitations.    Recommendations for follow up therapy are one component of a multi-disciplinary discharge planning process, led by the attending physician.  Recommendations may be updated based on patient status, additional functional criteria and insurance authorization.    Follow Up Recommendations  SNF;Supervision/Assistance - 24 hour    Equipment Recommendations   (defer to next venue, would benefit from reacher, LH sponge, LH shoe horn, toileting aide, and sock aide.)    Recommendations for Other Services      Precautions /  Restrictions Precautions Precautions: Fall Precaution Comments: multiple pressure wounds Restrictions Weight Bearing Restrictions: No       Mobility Bed Mobility               General bed mobility comments: Pt seated in recliner    Transfers Overall transfer level: Needs assistance Equipment used: Rolling walker (2 wheeled) Transfers: Sit to/from Stand Sit to Stand: Supervision         General transfer comment: OT stabilized bari RW, supervision to complete    Balance Overall balance assessment: Needs assistance Sitting-balance support: Feet supported;Bilateral upper extremity supported Sitting balance-[name]: Good     Standing balance support: Single extremity supported;During functional activity;No upper extremity supported Standing balance-[name]: Fair Standing balance comment: briefly able to let go with both hands to open deodorant, but otherwise noted to have improved balance and confidence with at least UE support on RW                           ADL either performed or assessed with clinical judgement   ADL Overall ADL's : Needs assistance/impaired     Grooming: Standing;Set up;Supervision/safety;Oral care;Wash/dry face;Applying deodorant Grooming Details (indicate cue type and reason): Pt tolerated well, PRN cues for PLB. Able to briefly let go of RW to manipulate items with both hands but felt more confident with UE support on RW  Vision       Perception     Praxis      Cognition Arousal/Alertness: Awake/alert Behavior During Therapy: WFL for tasks assessed/performed Overall Cognitive Status: Within Functional Limits for tasks assessed                                          Exercises Other Exercises Other Exercises: Pt instructed in incentive spirometer and flutter valve, pt able to return demo proper technique after instruction (noted with improper  technique upon initial try before instruction). Pt encouraged to use during commercial breaks throughout the day to support improved lung function. Pt verbalized understanding. Other Exercises: Pt instructed in PLB and ECS to support ADL participation and safety. Educated in AE to help with pericare and LB ADL.   Shoulder Instructions       General Comments posterior leg/buttocks bandages noted to be saturated and not sticking well. RN notified.    Pertinent Vitals/ Pain       Pain Assessment: No/denies pain  Home Living                                          Prior Functioning/Environment              Frequency  Min 2X/week        Progress Toward Goals  OT Goals(current goals can now be found in the care plan section)  Progress towards OT goals: Progressing toward goals  Acute Rehab OT Goals Patient Stated Goal: be able to return to work again OT Goal Formulation: With patient Time For Goal Achievement: 11/13/20 Potential to Achieve Goals: Good  Plan Discharge plan remains appropriate;Frequency remains appropriate    Co-evaluation                 AM-PAC OT "6 Clicks" Daily Activity     Outcome Measure   Help from another person eating meals?: None Help from another person taking care of personal grooming?: A Little Help from another person toileting, which includes using toliet, bedpan, or urinal?: A Lot Help from another person bathing (including washing, rinsing, drying)?: A Lot Help from another person to put on and taking off regular upper body clothing?: A Little Help from another person to put on and taking off regular lower body clothing?: A Lot 6 Click Score: 16    End of Session Equipment Utilized During Treatment: Rolling walker;Oxygen  OT Visit Diagnosis: Unsteadiness on feet (R26.81);Muscle weakness (generalized) (M62.81)   Activity Tolerance Patient tolerated treatment well   Patient Left in chair;with call  bell/phone within reach;with chair alarm set   Nurse Communication Mobility status (wounds)        Time: 2426-8341 OT Time Calculation (min): 30 min  Charges: OT General Charges $OT Visit: 1 Visit OT Treatments $Self Care/Home Management : 23-37 mins  Arman Filter., MPH, MS, OTR/L ascom [phone] 11/09/20, 3:59 PM

## 2020-11-10 NOTE — Progress Notes (Signed)
Patient ID: Christian [name], male   DOB: [birth year], 45 y.o.   MRN: [id number]  PROGRESS NOTE    Sulaiman Imbert  DGU:[id number] DOB: Dec 04, 1975 DOA: 10/09/2020 PCP: Pcp, No   Brief Narrative:  45 y.o. male with a history of morbid obesity, hypertension presented with  lower abdominal pain with evidence of abdominal wall cellulitis and also found to have lower extremity cellulitis.  He was treated with antibiotics with improvement.  During the hospitalization, he developed pneumoperitoneum with cecal perforation requiring surgical repair.  Patient was also found to be hypoxic and hypercapnic requiring BiPAP at night.  Patient is currently stable for discharge.   Assessment & Plan:   Pneumoperitoneum with cecal perforation -Underwent diagnostic laparoscopy converted to open laparotomy on 10/15/2020 with repair of bowel perforation.  Patient has completed IV antibiotic course.  General surgery following intermittently for wound care.  Currently stable.  Abdominal wall cellulitis/lower extremity cellulitis -Has completed course of IV and oral antibiotics along with antifungals.  Severe sepsis: Present on admission; resolved. -Hemodynamically stable currently  Morbid obesity -Outpatient follow-up  Acute respiratory failure with hypoxia and hypercapnia -PCO2 up to 81 on ABG from 10/16/2020.  Currently requiring BiPAP at night.  No official diagnosis but possibly related to OSA/OHS.  Continue BiPAP nightly.  AKI -Resolved.  Weakness/generalized deconditioning -PT recommending SNF.  Social worker following.  Chronic normocytic anemia Iron deficiency anemia -Hemoglobin stable currently. -Continue iron supplementation  Poor social situation/homelessness -Child psychotherapist following  Various pressure injuries: Present on admission -I agree with pressure injury documentation as below.  Continue wound care. Pressure Injury 10/09/20 Buttocks Right Unstageable - Full thickness tissue loss in  which the base of the injury is covered by slough (yellow, tan, gray, green or brown) and/or eschar (tan, brown or black) in the wound bed. (Active)  10/09/20 1933  Location: Buttocks  Location Orientation: Right  Staging: Unstageable - Full thickness tissue loss in which the base of the injury is covered by slough (yellow, tan, gray, green or brown) and/or eschar (tan, brown or black) in the wound bed.  Wound Description (Comments):   Present on Admission: Yes     Pressure Injury 10/09/20 Heel Left Stage 2 -  Partial thickness loss of dermis presenting as a shallow open injury with a red, pink wound bed without slough. (Active)  10/09/20   Location: Heel  Location Orientation: Left  Staging: Stage 2 -  Partial thickness loss of dermis presenting as a shallow open injury with a red, pink wound bed without slough.  Wound Description (Comments):   Present on Admission: Yes     Pressure Injury 10/22/20 Thigh Posterior;Proximal;Right Unstageable - Full thickness tissue loss in which the base of the injury is covered by slough (yellow, tan, gray, green or brown) and/or eschar (tan, brown or black) in the wound bed. (Active)  10/22/20 1055  Location: Thigh  Location Orientation: Posterior;Proximal;Right  Staging: Unstageable - Full thickness tissue loss in which the base of the injury is covered by slough (yellow, tan, gray, green or brown) and/or eschar (tan, brown or black) in the wound bed.  Wound Description (Comments):   Present on Admission: Yes (per handoff and report from other RN on unit- wound present on presentation to floor, patient reports wound present on admission- WOCN consulted)     DVT prophylaxis: Lovenox Code Status: Full Family Communication: None at bedside Disposition Plan: Status is: Inpatient  Remains inpatient appropriate because:Unsafe d/c plan  Dispo: The patient is from:  Home              Anticipated d/c is to: SNF              Patient currently is medically  stable to d/c.   Difficult to place patient Yes  Consultants: General surgery  Procedures: Diagnostic laparoscopy converted to open laparotomy with repair of bowel perforation  Antimicrobials:  Anti-infectives (From admission, onward)    Start     Dose/Rate Route Frequency Ordered Stop   10/17/20 1800  anidulafungin (ERAXIS) 100 mg in sodium chloride 0.9 % 100 mL IVPB  Status:  Discontinued       See Hyperspace for full Linked Orders Report.   100 mg 78 mL/hr over 100 Minutes Intravenous Every 24 hours 10/16/20 1706 10/18/20 1221   10/16/20 1800  anidulafungin (ERAXIS) 200 mg in sodium chloride 0.9 % 200 mL IVPB       See Hyperspace for full Linked Orders Report.   200 mg 78 mL/hr over 200 Minutes Intravenous  Once 10/16/20 1706 10/16/20 2141   10/15/20 2030  piperacillin-tazobactam (ZOSYN) IVPB 3.375 g        3.375 g 12.5 mL/hr over 240 Minutes Intravenous Every 8 hours 10/15/20 1925 10/19/20 2358   10/12/20 2200  cephALEXin (KEFLEX) capsule 1,000 mg  Status:  Discontinued        1,000 mg Oral Every 8 hours 10/12/20 1416 10/15/20 1917   10/12/20 1600  fluconazole (DIFLUCAN) tablet 200 mg        200 mg Oral Every 24 hours 10/12/20 1417 10/17/20 1559   10/11/20 1600  fluconazole (DIFLUCAN) tablet 400 mg  Status:  Discontinued        400 mg Oral Every 24 hours 10/10/20 1519 10/12/20 1417   10/10/20 1615  fluconazole (DIFLUCAN) tablet 800 mg        800 mg Oral  Once 10/10/20 1519 10/10/20 1641   10/10/20 1000  cefTRIAXone (ROCEPHIN) 2 g in sodium chloride 0.9 % 100 mL IVPB  Status:  Discontinued        2 g 200 mL/hr over 30 Minutes Intravenous Every 24 hours 10/09/20 1236 10/09/20 1408   10/10/20 0000  vancomycin (VANCOREADY) IVPB 1750 mg/350 mL  Status:  Discontinued        1,750 mg 175 mL/hr over 120 Minutes Intravenous Every 12 hours 10/09/20 1722 10/12/20 1415   10/09/20 1430  vancomycin (VANCOREADY) IVPB 1500 mg/300 mL  Status:  Discontinued        1,500 mg 150 mL/hr over 120  Minutes Intravenous  Once 10/09/20 1414 10/10/20 0120   10/09/20 1145  cefTRIAXone (ROCEPHIN) 1 g in sodium chloride 0.9 % 100 mL IVPB        1 g 200 mL/hr over 30 Minutes Intravenous  Once 10/09/20 1139 10/09/20 1356   10/09/20 1145  vancomycin (VANCOCIN) IVPB 1000 mg/200 mL premix        1,000 mg 200 mL/hr over 60 Minutes Intravenous  Once 10/09/20 1139 10/09/20 1656        Subjective: Patient seen and examined at bedside.  Poor historian.  No worsening shortness of breath, fever or vomiting reported.   Objective: Vitals:   11/09/20 0913 11/09/20 1520 11/09/20 2117 11/10/20 0518  BP: 118/67 136/86 110/61 115/69  Pulse: 80 89 92 75  Resp: 18 18 18 18   Temp: 98.1 F (36.7 C) 98.6 F (37 C) 98.4 F (36.9 C) 98.1 F (36.7 C)  TempSrc: Oral  SpO2: 100% 93% 99% 99%  Weight:      Height:        Intake/Output Summary (Last 24 hours) at 11/10/2020 0819 Last data filed at 11/09/2020 2324 Gross per 24 hour  Intake 840 ml  Output 1325 ml  Net -485 ml    Filed Weights   10/09/20 0807 10/15/20 1600  Weight: (!) 197.3 kg (!) 201.6 kg    Examination:  General exam: Currently on 2 L oxygen by nasal delivery no acute distress.  Looks chronically ill and deconditioned.   Respiratory system: Bilateral decreased breath sounds at bases with crackles  cardiovascular system: S1-S2 heard, rate controlled gastrointestinal system: Abdomen is morbidly obese, distended, soft and nontender.  Midline dressing present.  Normal bowel sounds heard extremities: No cyanosis; bilateral lower extremity edema present  Data Reviewed: I have personally reviewed following labs and imaging studies  CBC: No results for input(s): WBC, NEUTROABS, HGB, HCT, MCV, PLT in the last 168 hours.  Basic Metabolic Panel: No results for input(s): NA, K, CL, CO2, GLUCOSE, BUN, CREATININE, CALCIUM, MG, PHOS in the last 168 hours.  GFR: Estimated Creatinine Clearance: 201.7 mL/min (by C-G formula based on SCr of  0.85 mg/dL). Liver Function Tests: No results for input(s): AST, ALT, ALKPHOS, BILITOT, PROT, ALBUMIN in the last 168 hours. No results for input(s): LIPASE, AMYLASE in the last 168 hours. No results for input(s): AMMONIA in the last 168 hours. Coagulation Profile: No results for input(s): INR, PROTIME in the last 168 hours. Cardiac Enzymes: No results for input(s): CKTOTAL, CKMB, CKMBINDEX, TROPONINI in the last 168 hours. BNP (last 3 results) No results for input(s): PROBNP in the last 8760 hours. HbA1C: No results for input(s): HGBA1C in the last 72 hours. CBG: No results for input(s): GLUCAP in the last 168 hours.  Lipid Profile: No results for input(s): CHOL, HDL, LDLCALC, TRIG, CHOLHDL, LDLDIRECT in the last 72 hours. Thyroid Function Tests: No results for input(s): TSH, T4TOTAL, FREET4, T3FREE, THYROIDAB in the last 72 hours. Anemia Panel: No results for input(s): VITAMINB12, FOLATE, FERRITIN, TIBC, IRON, RETICCTPCT in the last 72 hours.  Sepsis Labs: No results for input(s): PROCALCITON, LATICACIDVEN in the last 168 hours.  No results found for this or any previous visit (from the past 240 hour(s)).       Radiology Studies: No results found.      Scheduled Meds:  vitamin C  500 mg Oral BID   collagenase   Topical Daily   enoxaparin (LOVENOX) injection  100 mg Subcutaneous Q24H   feeding supplement  237 mL Oral TID BM   folic acid  1 mg Oral Daily   furosemide  40 mg Oral Daily   hydrocerin   Topical BID   iron polysaccharides  150 mg Oral Daily   multivitamin with minerals  1 tablet Oral Daily   potassium chloride  20 mEq Oral BID   vitamin B-12  100 mcg Oral Daily   Vitamin D (Ergocalciferol)  50,000 Units Oral Q7 days   Continuous Infusions:        Glade Lloyd, MD [facility] 11/10/2020, 8:19 AM

## 2020-11-10 NOTE — Progress Notes (Signed)
Mobility Specialist - Progress Note   11/10/20 1118  Mobility  Activity Ambulated in hall;Transferred to/from [facility]  Level of Assistance Standby assist, set-up cues, supervision of patient - no hands on  Assistive Device Front wheel walker  Distance Ambulated (ft) 200 ft  Mobility Out of bed for toileting;Ambulated with assistance in hallway  Mobility Response Tolerated well  Mobility performed by Mobility specialist  $Mobility charge 1 Mobility    During mobility: 101-106 HR, 92% SpO2  Pt sitting in recliner utilizing 2L. of urinary output prior to ambulation. Pt ambulated in hallway +2 for chair follow. Winded with activity. O2 maintained > 90%. 3 seated rest breaks initiated. Pt returned to [facility] upon return to room, unsuccessful with BM. Pt left in recliner, alarm set and needs in reach.    Christian [name] Mobility Specialist 11/10/20, 11:23 AM

## 2020-11-10 NOTE — Plan of Care (Signed)

## 2020-11-10 NOTE — TOC Progression Note (Signed)
Transition of Care [facility]) - Progression Note    Patient Details  Name: Christian [name] MRN: [id number] Date of Birth: Jul 13, 1975  [facility] CM/SW Contact  Maree Krabbe, LCSW Phone Number: 11/10/2020, 1:00 PM  Clinical Narrative:  CSW emailed Amy Shepard with [employer] to see if pt being on BIPAP chronically would qualify pt for Medicaid.       Barriers to Discharge: Continued Medical Work up  Expected Discharge Plan and Services         Living arrangements for the past 2 months: Homeless                                       Social Determinants of Health (SDOH) Interventions    Readmission Risk Interventions Readmission Risk Prevention Plan 10/11/2020  Post Dischage Appt Complete  Medication Screening Complete  Transportation Screening Complete  Some recent data might be hidden

## 2020-11-10 NOTE — Progress Notes (Signed)
Physical Therapy Treatment Patient Details Name: Christian [name] MRN: [id number] DOB: 1975/12/22 Today's Date: 11/10/2020   History of Present Illness Pt is a 45 y/o M who presented to ED on 10/09/20 with c/c of bilateral lower abdominal pain & c/o emesis twice that day after eating. Pt found to have abdominal wall purulent cellulitis. Pt admitted with clinical sepsis & AKI. PMH: morbid obesity, HTN. Pt with increased SOB starting 8/18 and CXR ordered with concern for free air. CT showed pneumoperitoneum suspicious for bowel perforation. Pt now s/p diagnostic laparoscopy converted to exploratory laparotomy, primary repair of cecal perforation and t/f to ICU on 8/19.    PT Comments    Pt was pleasant and motivated to participate during the session and put forth good effort throughout.  Pt demonstrated good eccentric and concentric control and stability with transfers.  Pt ambulated with slow cadence but was steady throughout with no LOB or buckling.  Pt was only able to amb a max of 75 feet before needing to sit and recover with cues for PLB.  Pt's SpO2 on 2LO2/min remained in the mid 90s throughout the session with HR increasing from the 90s at rest to the low to mid 110's after amb.  Pt will benefit from PT services in a SNF setting upon discharge to safely address deficits listed in patient problem list for decreased caregiver assistance and eventual return to PLOF.     Recommendations for follow up therapy are one component of a multi-disciplinary discharge planning process, led by the attending physician.  Recommendations may be updated based on patient status, additional functional criteria and insurance authorization.  Follow Up Recommendations  SNF;Supervision/Assistance - 24 hour;Supervision for mobility/OOB     Equipment Recommendations  Other (comment);3in1 (PT);Rolling walker with 5" wheels (bariatric DME)    Recommendations for Other Services       Precautions / Restrictions  Precautions Precautions: Fall Precaution Comments: multiple pressure wounds Restrictions Weight Bearing Restrictions: No     Mobility  Bed Mobility               General bed mobility comments: NT, pt in recliner    Transfers Overall transfer level: Needs assistance Equipment used: Rolling walker (2 wheeled) Transfers: Sit to/from Stand Sit to Stand: Supervision         General transfer comment: Good eccentric and concentric control and stability  Ambulation/Gait Ambulation/Gait assistance: Min guard Gait Distance (Feet): 75 Feet x 3 with seated rest breaks between sessions and cues for PLB Assistive device: Rolling walker (2 wheeled) Gait Pattern/deviations: Step-through pattern;Trunk flexed Gait velocity: decreased   General Gait Details: Mod verbal cues for upright posture with decreased UE WB on the RW   Stairs             Wheelchair Mobility    Modified Rankin (Stroke Patients Only)       Balance Overall balance assessment: Needs assistance Sitting-balance support: Feet supported;Bilateral upper extremity supported Sitting balance-[name]: Good     Standing balance support: During functional activity;Bilateral upper extremity supported Standing balance-[name]: Fair Standing balance comment: Min to mod lean on the RW for support during ambulation                            Cognition Arousal/Alertness: Awake/alert Behavior During Therapy: WFL for tasks assessed/performed Overall Cognitive Status: Within Functional Limits for tasks assessed  Exercises Other Exercises Other Exercises: Unsupported sitting at the edge of the chair 2 x 5 min for improved core strength and activity tolerance Other Exercises: Pt instructed in PLB during ambulation for improved activity tolerance    General Comments        Pertinent Vitals/Pain Pain Assessment: No/denies pain     Home Living                      Prior Function            PT Goals (current goals can now be found in the care plan section) Progress towards PT goals: Progressing toward goals    Frequency    Min 2X/week      PT Plan Current plan remains appropriate    Co-evaluation              AM-PAC PT "6 Clicks" Mobility   Outcome Measure  Help needed turning from your back to your side while in a flat bed without using bedrails?: A Little Help needed moving from lying on your back to sitting on the side of a flat bed without using bedrails?: A Little Help needed moving to and from a bed to a chair (including a wheelchair)?: A Little Help needed standing up from a chair using your arms (e.g., wheelchair or bedside chair)?: A Little Help needed to walk in hospital room?: A Little Help needed climbing 3-5 steps with a railing? : A Lot 6 Click Score: 17    End of Session Equipment Utilized During Treatment: Oxygen;Gait belt Activity Tolerance: Patient tolerated treatment well Patient left: in chair;with call bell/phone within reach;with chair alarm set Nurse Communication: Mobility status PT Visit Diagnosis: Muscle weakness (generalized) (M62.81);Difficulty in walking, not elsewhere classified (R26.2)     Time: 9373-4287 PT Time Calculation (min) (ACUTE ONLY): 40 min  Charges:  $Gait Training: 23-37 mins $Therapeutic Activity: 8-22 mins                     D. Scott Alyric Parkin PT, DPT 11/10/20, 3:35 PM

## 2020-11-11 LAB — CBC WITH DIFFERENTIAL/PLATELET
Abs Immature Granulocytes: 0.05 10*3/uL (ref 0.00–0.07)
Basophils Absolute: 0.1 10*3/uL (ref 0.0–0.1)
Basophils Relative: 1 %
Eosinophils Absolute: 0.6 10*3/uL — ABNORMAL HIGH (ref 0.0–0.5)
Eosinophils Relative: 8 %
HCT: 37.5 % — ABNORMAL LOW (ref 39.0–52.0)
Hemoglobin: 11.4 g/dL — ABNORMAL LOW (ref 13.0–17.0)
Immature Granulocytes: 1 %
Lymphocytes Relative: 24 %
Lymphs Abs: 1.7 10*3/uL (ref 0.7–4.0)
MCH: 26.2 pg (ref 26.0–34.0)
MCHC: 30.4 g/dL (ref 30.0–36.0)
MCV: 86.2 fL (ref 80.0–100.0)
Monocytes Absolute: 0.8 10*3/uL (ref 0.1–1.0)
Monocytes Relative: 12 %
Neutro Abs: 3.8 10*3/uL (ref 1.7–7.7)
Neutrophils Relative %: 54 %
Platelets: 260 10*3/uL (ref 150–400)
RBC: 4.35 MIL/uL (ref 4.22–5.81)
RDW: 14.6 % (ref 11.5–15.5)
WBC: 7 10*3/uL (ref 4.0–10.5)
nRBC: 0 % (ref 0.0–0.2)

## 2020-11-11 LAB — BASIC METABOLIC PANEL
Anion gap: 9 (ref 5–15)
BUN: 14 mg/dL (ref 6–20)
CO2: 30 mmol/L (ref 22–32)
Calcium: 8.4 mg/dL — ABNORMAL LOW (ref 8.9–10.3)
Chloride: 99 mmol/L (ref 98–111)
Creatinine, Ser: 0.76 mg/dL (ref 0.61–1.24)
GFR, Estimated: >60 mL/min (ref >60)
Glucose, Bld: 88 mg/dL (ref 70–99)
Potassium: 3.9 mmol/L (ref 3.5–5.1)
Sodium: 138 mmol/L (ref 135–145)

## 2020-11-11 LAB — MAGNESIUM: Magnesium: 2.3 mg/dL (ref 1.7–2.4)

## 2020-11-11 NOTE — TOC Progression Note (Signed)
Transition of Care [facility]) - Progression Note    Patient Details  Name: Christian [name] MRN: [id number] Date of Birth: [birth year]  Transition of Care [facility]) CM/SW Contact  Maree Krabbe, LCSW Phone Number: 11/11/2020, 8:28 AM  Clinical Narrative:   Tommie Ard is out of the office until 9/19 so CSW emailed [employer].      Barriers to Discharge: Continued Medical Work up  Expected Discharge Plan and Services         Living arrangements for the past 2 months: Homeless                                       Social Determinants of Health (SDOH) Interventions    Readmission Risk Interventions Readmission Risk Prevention Plan 10/11/2020  Post Dischage Appt Complete  Medication Screening Complete  Transportation Screening Complete  Some recent data might be hidden

## 2020-11-11 NOTE — Plan of Care (Signed)

## 2020-11-11 NOTE — Progress Notes (Signed)
Physical Therapy Treatment Patient Details Name: Christian [name] MRN: [id number] DOB: Dec 18, 1975 Today's Date: 11/11/2020   History of Present Illness Pt is a 45 y/o M who presented to ED on 10/09/20 with c/c of bilateral lower abdominal pain & c/o emesis twice that day after eating. Pt found to have abdominal wall purulent cellulitis. Pt admitted with clinical sepsis & AKI. PMH: morbid obesity, HTN. Pt with increased SOB starting 8/18 and CXR ordered with concern for free air. CT showed pneumoperitoneum suspicious for bowel perforation. Pt now s/p diagnostic laparoscopy converted to exploratory laparotomy, primary repair of cecal perforation and t/f to ICU on 8/19.    PT Comments    Pt was pleasant and motivated to participate during the session and performed well during the session.  Pt was steady with good eccentric and concentric control with transfers and ambulated with no LOB or report of adverse symptoms.  Pt's SpO2 and HR were both WNL on 2LO2/min during the session.  Pt will benefit from PT services in a SNF setting upon discharge to safely address deficits listed in patient problem list for decreased caregiver assistance and eventual return to PLOF.     Recommendations for follow up therapy are one component of a multi-disciplinary discharge planning process, led by the attending physician.  Recommendations may be updated based on patient status, additional functional criteria and insurance authorization.  Follow Up Recommendations  SNF;Supervision/Assistance - 24 hour;Supervision for mobility/OOB     Equipment Recommendations  Other (comment);3in1 (PT);Rolling walker with 5" wheels    Recommendations for Other Services       Precautions / Restrictions Precautions Precautions: Fall Precaution Comments: multiple pressure wounds, abdominal surgical incision Restrictions Weight Bearing Restrictions: No     Mobility  Bed Mobility               General bed mobility  comments: NT, pt in reclienr    Transfers Overall transfer level: Needs assistance Equipment used: Rolling walker (2 wheeled) Transfers: Sit to/from Stand Sit to Stand: Supervision         General transfer comment: Good eccentric and concentric control and stability  Ambulation/Gait Ambulation/Gait assistance: Supervision Gait Distance (Feet): 75 Feet Assistive device: Rolling walker (2 wheeled) Gait Pattern/deviations: Step-through pattern;Trunk flexed Gait velocity: decreased   General Gait Details: Mod verbal cues for upright posture with decreased UE WB on the RW   Stairs             Wheelchair Mobility    Modified Rankin (Stroke Patients Only)       Balance Overall balance assessment: Needs assistance Sitting-balance support: Feet supported Sitting balance-[name]: Good     Standing balance support: During functional activity;Bilateral upper extremity supported Standing balance-[name]: Fair Standing balance comment: Min to mod lean on the RW for support during ambulation                            Cognition Arousal/Alertness: Awake/alert Behavior During Therapy: WFL for tasks assessed/performed Overall Cognitive Status: Within Functional Limits for tasks assessed                                 General Comments: Pt is A and O x 3 however poor insight of deficits and overall medical concerns      Exercises Total Joint Exercises Ankle Circles/Pumps: Strengthening;Both;10 reps Quad Sets: Strengthening;Both;10 reps Gluteal Sets: Strengthening;Both;10  reps Long Arc Quad: Strengthening;Both;10 reps Other Exercises Other Exercises: Unsupported sitting at edge of chair x 5 min for gentle core strengthening and improved activity tolerance Other Exercises: Grooming, sit<>stand, sitting/stnading balance/tolerance    General Comments General comments (skin integrity, edema, etc.): SpO2 low 90s t/o on 2L [place]       Pertinent Vitals/Pain Pain Assessment: No/denies pain    Home Living                      Prior Function            PT Goals (current goals can now be found in the care plan section) Acute Rehab PT Goals Patient Stated Goal: be able to return to work again Progress towards PT goals: Progressing toward goals    Frequency    Min 2X/week      PT Plan Current plan remains appropriate    Co-evaluation              AM-PAC PT "6 Clicks" Mobility   Outcome Measure  Help needed turning from your back to your side while in a flat bed without using bedrails?: A Little Help needed moving from lying on your back to sitting on the side of a flat bed without using bedrails?: A Little Help needed moving to and from a bed to a chair (including a wheelchair)?: A Little Help needed standing up from a chair using your arms (e.g., wheelchair or bedside chair)?: A Little Help needed to walk in hospital room?: A Little Help needed climbing 3-5 steps with a railing? : A Lot 6 Click Score: 17    End of Session Equipment Utilized During Treatment: Gait belt;Oxygen Activity Tolerance: Patient tolerated treatment well Patient left: in chair;with call bell/phone within reach;with chair alarm set Nurse Communication: Mobility status PT Visit Diagnosis: Muscle weakness (generalized) (M62.81);Difficulty in walking, not elsewhere classified (R26.2)     Time: 7989-2119 PT Time Calculation (min) (ACUTE ONLY): 25 min  Charges:  $Gait Training: 8-22 mins $Therapeutic Exercise: 8-22 mins                     D. Scott Dontreal Miera PT, DPT 11/11/20, 5:23 PM

## 2020-11-11 NOTE — Progress Notes (Signed)
Patient ID: Christian [name], male   DOB: 20-Aug-1975, 45 y.o.   MRN: [id number]  PROGRESS NOTE    Christian [name]  HGD:[id number] DOB: 1975/05/19 DOA: 10/09/2020 PCP: Pcp, No   Brief Narrative:  45 y.o. male with a history of morbid obesity, hypertension presented with  lower abdominal pain with evidence of abdominal wall cellulitis and also found to have lower extremity cellulitis.  He was treated with antibiotics with improvement.  During the hospitalization, he developed pneumoperitoneum with cecal perforation requiring surgical repair.  Patient was also found to be hypoxic and hypercapnic requiring BiPAP at night.  Patient is currently stable for discharge.   Assessment & Plan:   Pneumoperitoneum with cecal perforation -Underwent diagnostic laparoscopy converted to open laparotomy on 10/15/2020 with repair of bowel perforation.  Patient has completed IV antibiotic course.  General surgery following intermittently for wound care.  Currently stable.  Abdominal wall cellulitis/lower extremity cellulitis -Has completed course of IV and oral antibiotics along with antifungals.  Severe sepsis: Present on admission; resolved. -Hemodynamically stable currently  Morbid obesity -Outpatient follow-up  Acute respiratory failure with hypoxia and hypercapnia -PCO2 up to 81 on ABG from 10/16/2020.  Currently requiring BiPAP at night.  No official diagnosis but possibly related to OSA/OHS.  Continue BiPAP nightly.  AKI -Resolved.  Weakness/generalized deconditioning -PT recommending SNF.  Social worker following.  Chronic normocytic anemia Iron deficiency anemia -Hemoglobin stable currently. -Continue iron supplementation  Poor social situation/homelessness -Child psychotherapist following  Various pressure injuries: Present on admission -I agree with pressure injury documentation as below.  Continue wound care. Pressure Injury 10/09/20 Buttocks Right Unstageable - Full thickness tissue loss in  which the base of the injury is covered by slough (yellow, tan, gray, green or brown) and/or eschar (tan, brown or black) in the wound bed. (Active)  10/09/20 1933  Location: Buttocks  Location Orientation: Right  Staging: Unstageable - Full thickness tissue loss in which the base of the injury is covered by slough (yellow, tan, gray, green or brown) and/or eschar (tan, brown or black) in the wound bed.  Wound Description (Comments):   Present on Admission: Yes     Pressure Injury 10/09/20 Heel Left Stage 2 -  Partial thickness loss of dermis presenting as a shallow open injury with a red, pink wound bed without slough. (Active)  10/09/20   Location: Heel  Location Orientation: Left  Staging: Stage 2 -  Partial thickness loss of dermis presenting as a shallow open injury with a red, pink wound bed without slough.  Wound Description (Comments):   Present on Admission: Yes     Pressure Injury 10/22/20 Thigh Posterior;Proximal;Right Unstageable - Full thickness tissue loss in which the base of the injury is covered by slough (yellow, tan, gray, green or brown) and/or eschar (tan, brown or black) in the wound bed. (Active)  10/22/20 1055  Location: Thigh  Location Orientation: Posterior;Proximal;Right  Staging: Unstageable - Full thickness tissue loss in which the base of the injury is covered by slough (yellow, tan, gray, green or brown) and/or eschar (tan, brown or black) in the wound bed.  Wound Description (Comments):   Present on Admission: Yes (per handoff and report from other RN on unit- wound present on presentation to floor, patient reports wound present on admission- WOCN consulted)     DVT prophylaxis: Lovenox Code Status: Full Family Communication: None at bedside Disposition Plan: Status is: Inpatient  Remains inpatient appropriate because:Unsafe d/c plan  Dispo: The patient is from:  Home              Anticipated d/c is to: SNF              Patient currently is medically  stable to d/c.   Difficult to place patient Yes  Consultants: General surgery  Procedures: Diagnostic laparoscopy converted to open laparotomy with repair of bowel perforation  Antimicrobials:  Anti-infectives (From admission, onward)    Start     Dose/Rate Route Frequency Ordered Stop   10/17/20 1800  anidulafungin (ERAXIS) 100 mg in sodium chloride 0.9 % 100 mL IVPB  Status:  Discontinued       See Hyperspace for full Linked Orders Report.   100 mg 78 mL/hr over 100 Minutes Intravenous Every 24 hours 10/16/20 1706 10/18/20 1221   10/16/20 1800  anidulafungin (ERAXIS) 200 mg in sodium chloride 0.9 % 200 mL IVPB       See Hyperspace for full Linked Orders Report.   200 mg 78 mL/hr over 200 Minutes Intravenous  Once 10/16/20 1706 10/16/20 2141   10/15/20 2030  piperacillin-tazobactam (ZOSYN) IVPB 3.375 g        3.375 g 12.5 mL/hr over 240 Minutes Intravenous Every 8 hours 10/15/20 1925 10/19/20 2358   10/12/20 2200  cephALEXin (KEFLEX) capsule 1,000 mg  Status:  Discontinued        1,000 mg Oral Every 8 hours 10/12/20 1416 10/15/20 1917   10/12/20 1600  fluconazole (DIFLUCAN) tablet 200 mg        200 mg Oral Every 24 hours 10/12/20 1417 10/17/20 1559   10/11/20 1600  fluconazole (DIFLUCAN) tablet 400 mg  Status:  Discontinued        400 mg Oral Every 24 hours 10/10/20 1519 10/12/20 1417   10/10/20 1615  fluconazole (DIFLUCAN) tablet 800 mg        800 mg Oral  Once 10/10/20 1519 10/10/20 1641   10/10/20 1000  cefTRIAXone (ROCEPHIN) 2 g in sodium chloride 0.9 % 100 mL IVPB  Status:  Discontinued        2 g 200 mL/hr over 30 Minutes Intravenous Every 24 hours 10/09/20 1236 10/09/20 1408   10/10/20 0000  vancomycin (VANCOREADY) IVPB 1750 mg/350 mL  Status:  Discontinued        1,750 mg 175 mL/hr over 120 Minutes Intravenous Every 12 hours 10/09/20 1722 10/12/20 1415   10/09/20 1430  vancomycin (VANCOREADY) IVPB 1500 mg/300 mL  Status:  Discontinued        1,500 mg 150 mL/hr over 120  Minutes Intravenous  Once 10/09/20 1414 10/10/20 0120   10/09/20 1145  cefTRIAXone (ROCEPHIN) 1 g in sodium chloride 0.9 % 100 mL IVPB        1 g 200 mL/hr over 30 Minutes Intravenous  Once 10/09/20 1139 10/09/20 1356   10/09/20 1145  vancomycin (VANCOCIN) IVPB 1000 mg/200 mL premix        1,000 mg 200 mL/hr over 60 Minutes Intravenous  Once 10/09/20 1139 10/09/20 1656        Subjective: Denies any abdominal pain or shortness of breath. Objective: Vitals:   11/10/20 2145 11/11/20 0458 11/11/20 0732 11/11/20 1456  BP: 118/63 130/67 127/79 134/66  Pulse: 93 79 76 92  Resp: 19 16 18 18   Temp: 98.4 F (36.9 C) 98 F (36.7 C) 97.8 F (36.6 C) 98.1 F (36.7 C)  TempSrc: Oral  Oral Oral  SpO2: 97% 100% 100% 94%  Weight:      Height:  Intake/Output Summary (Last 24 hours) at 11/11/2020 2149 Last data filed at 11/11/2020 1852 Gross per 24 hour  Intake 1080 ml  Output 425 ml  Net 655 ml   Filed Weights   10/09/20 0807 10/15/20 1600  Weight: (!) 197.3 kg (!) 201.6 kg    Examination:  General exam: Currently on 2 L oxygen by nasal delivery no acute distress.  Looks chronically ill and deconditioned.   Respiratory system: Bilateral decreased breath sounds at bases with crackles  cardiovascular system: S1-S2 heard, rate controlled gastrointestinal system: Abdomen is morbidly obese, distended, soft and nontender.  Midline dressing present.  Normal bowel sounds heard extremities: No cyanosis; bilateral lower extremity edema present  Data Reviewed: I have personally reviewed following labs and imaging studies  CBC: Recent Labs  Lab 11/11/20 0455  WBC 7.0  NEUTROABS 3.8  HGB 11.4*  HCT 37.5*  MCV 86.2  PLT 260   Basic Metabolic Panel: Recent Labs  Lab 11/11/20 0455  NA 138  K 3.9  CL 99  CO2 30  GLUCOSE 88  BUN 14  CREATININE 0.76  CALCIUM 8.4*  MG 2.3   GFR: Estimated Creatinine Clearance: 214.3 mL/min (by C-G formula based on SCr of 0.76  mg/dL). Liver Function Tests: No results for input(s): AST, ALT, ALKPHOS, BILITOT, PROT, ALBUMIN in the last 168 hours. No results for input(s): LIPASE, AMYLASE in the last 168 hours. No results for input(s): AMMONIA in the last 168 hours. Coagulation Profile: No results for input(s): INR, PROTIME in the last 168 hours. Cardiac Enzymes: No results for input(s): CKTOTAL, CKMB, CKMBINDEX, TROPONINI in the last 168 hours. BNP (last 3 results) No results for input(s): PROBNP in the last 8760 hours. HbA1C: No results for input(s): HGBA1C in the last 72 hours. CBG: No results for input(s): GLUCAP in the last 168 hours.  Lipid Profile: No results for input(s): CHOL, HDL, LDLCALC, TRIG, CHOLHDL, LDLDIRECT in the last 72 hours. Thyroid Function Tests: No results for input(s): TSH, T4TOTAL, FREET4, T3FREE, THYROIDAB in the last 72 hours. Anemia Panel: No results for input(s): VITAMINB12, FOLATE, FERRITIN, TIBC, IRON, RETICCTPCT in the last 72 hours.  Sepsis Labs: No results for input(s): PROCALCITON, LATICACIDVEN in the last 168 hours.  No results found for this or any previous visit (from the past 240 hour(s)).       Radiology Studies: No results found.      Scheduled Meds:  vitamin C  500 mg Oral BID   collagenase   Topical Daily   enoxaparin (LOVENOX) injection  100 mg Subcutaneous Q24H   feeding supplement  237 mL Oral TID BM   folic acid  1 mg Oral Daily   furosemide  40 mg Oral Daily   hydrocerin   Topical BID   iron polysaccharides  150 mg Oral Daily   multivitamin with minerals  1 tablet Oral Daily   potassium chloride  20 mEq Oral BID   vitamin B-12  100 mcg Oral Daily   Vitamin D (Ergocalciferol)  50,000 Units Oral Q7 days   Continuous Infusions:        Erick Blinks, MD [facility] 11/11/2020, 9:49 PM

## 2020-11-11 NOTE — Progress Notes (Signed)
Nutrition Follow-up  DOCUMENTATION CODES:   Morbid obesity  INTERVENTION:   Ensure Enlive po TID, each supplement provides 350 kcal and 20 grams of protein  MVI po daily   Vitamin C 567m po BID   Double protein with meals   NUTRITION DIAGNOSIS:   Increased nutrient needs related to wound healing as evidenced by estimated needs.  GOAL:   Patient will meet greater than or equal to 90% of their needs -met   MONITOR:   PO intake, Supplement acceptance, Labs, Weight trends, Skin, I & O's  ASSESSMENT:   45yo male with a PMH of morbid obesity and HTN who presents with severe sepsis 2/2 abdominal wall and bilateral lower extremity cellulitis. Also with pneumoperitoneum with cecal perforation (s/p ex lap) and AKI (now resolved).  Pt continues to have good appetite and oral intake in hospital; pt eating 100% of meals in hospital and is drinking Ensure supplements. Pt is also receiving double protein with meal trays. Juven discontinued r/t backorder; vitamin C added to support wound healing. No new weight since 8/18.   Medications reviewed and include: vitamin C, lovenox, lasix, folic acid, iron polysaccharides, MVI, KCl, vitamin D, B12  Labs reviewed: K 3.9 wnl, Mg 2.3 wnl  Diet Order:   Diet Order             Diet Heart Room service appropriate? Yes; Fluid consistency: Thin  Diet effective 1400                  EDUCATION NEEDS:   Education needs have been addressed  Skin:  Skin Assessment: Skin Integrity Issues: Skin Integrity Issues:: Stage II, Unstageable, Incisions Stage II: L heel, R heel, R foot Unstageable: L buttocks and R buttocks Incisions: Abdomen, closed  Last BM:  9/14- type 2  Height:   Ht Readings from Last 1 Encounters:  10/09/20 _0  (1.905 m)    Weight:   Wt Readings from Last 1 Encounters:  10/15/20 (!) 201.6 kg   BMI:  Body mass index is 55.55 kg/m.  Estimated Nutritional Needs:   Kcal:  3200-3500kcal/day  Protein:   >160g/day  Fluid:  2.7-3.0L/day  CKoleen DistanceMS, RD, LDN Please refer to AVision Correction Centerfor RD and/or RD on-call/weekend/after hours pager

## 2020-11-12 ENCOUNTER — Inpatient Hospital Stay: Payer: Self-pay

## 2020-11-12 NOTE — Progress Notes (Signed)
Patient ID: Christian [name], male   DOB: 20-Aug-1975, 45 y.o.   MRN: [id number]  PROGRESS NOTE    Christian [name]  [id number]:[id number] DOB: 1975/05/19 DOA: 10/09/2020 PCP: Pcp, No   Brief Narrative:  45 y.o. male with a history of morbid obesity, hypertension presented with  lower abdominal pain with evidence of abdominal wall cellulitis and also found to have lower extremity cellulitis.  He was treated with antibiotics with improvement.  During the hospitalization, he developed pneumoperitoneum with cecal perforation requiring surgical repair.  Patient was also found to be hypoxic and hypercapnic requiring BiPAP at night.  Patient is currently stable for discharge.   Assessment & Plan:   Pneumoperitoneum with cecal perforation -Underwent diagnostic laparoscopy converted to open laparotomy on 10/15/2020 with repair of bowel perforation.  Patient has completed IV antibiotic course.  General surgery following intermittently for wound care.  Currently stable.  Abdominal wall cellulitis/lower extremity cellulitis -Has completed course of IV and oral antibiotics along with antifungals.  Severe sepsis: Present on admission; resolved. -Hemodynamically stable currently  Morbid obesity -Outpatient follow-up  Acute respiratory failure with hypoxia and hypercapnia -PCO2 up to 81 on ABG from 10/16/2020.  Currently requiring BiPAP at night.  No official diagnosis but possibly related to OSA/OHS.  Continue BiPAP nightly.  AKI -Resolved.  Weakness/generalized deconditioning -PT recommending SNF.  Social worker following.  Chronic normocytic anemia Iron deficiency anemia -Hemoglobin stable currently. -Continue iron supplementation  Poor social situation/homelessness -Child psychotherapist following  Various pressure injuries: Present on admission -I agree with pressure injury documentation as below.  Continue wound care. Pressure Injury 10/09/20 Buttocks Right Unstageable - Full thickness tissue loss in  which the base of the injury is covered by slough (yellow, tan, gray, green or brown) and/or eschar (tan, brown or black) in the wound bed. (Active)  10/09/20 1933  Location: Buttocks  Location Orientation: Right  Staging: Unstageable - Full thickness tissue loss in which the base of the injury is covered by slough (yellow, tan, gray, green or brown) and/or eschar (tan, brown or black) in the wound bed.  Wound Description (Comments):   Present on Admission: Yes     Pressure Injury 10/09/20 Heel Left Stage 2 -  Partial thickness loss of dermis presenting as a shallow open injury with a red, pink wound bed without slough. (Active)  10/09/20   Location: Heel  Location Orientation: Left  Staging: Stage 2 -  Partial thickness loss of dermis presenting as a shallow open injury with a red, pink wound bed without slough.  Wound Description (Comments):   Present on Admission: Yes     Pressure Injury 10/22/20 Thigh Posterior;Proximal;Right Unstageable - Full thickness tissue loss in which the base of the injury is covered by slough (yellow, tan, gray, green or brown) and/or eschar (tan, brown or black) in the wound bed. (Active)  10/22/20 1055  Location: Thigh  Location Orientation: Posterior;Proximal;Right  Staging: Unstageable - Full thickness tissue loss in which the base of the injury is covered by slough (yellow, tan, gray, green or brown) and/or eschar (tan, brown or black) in the wound bed.  Wound Description (Comments):   Present on Admission: Yes (per handoff and report from other RN on unit- wound present on presentation to floor, patient reports wound present on admission- WOCN consulted)     DVT prophylaxis: Lovenox Code Status: Full Family Communication: None at bedside Disposition Plan: Status is: Inpatient  Remains inpatient appropriate because:Unsafe d/c plan  Dispo: The patient is from:  Home              Anticipated d/c is to: SNF              Patient currently is medically  stable to d/c.   Difficult to place patient Yes  Consultants: General surgery  Procedures: Diagnostic laparoscopy converted to open laparotomy with repair of bowel perforation  Antimicrobials:  Anti-infectives (From admission, onward)    Start     Dose/Rate Route Frequency Ordered Stop   10/17/20 1800  anidulafungin (ERAXIS) 100 mg in sodium chloride 0.9 % 100 mL IVPB  Status:  Discontinued       See Hyperspace for full Linked Orders Report.   100 mg 78 mL/hr over 100 Minutes Intravenous Every 24 hours 10/16/20 1706 10/18/20 1221   10/16/20 1800  anidulafungin (ERAXIS) 200 mg in sodium chloride 0.9 % 200 mL IVPB       See Hyperspace for full Linked Orders Report.   200 mg 78 mL/hr over 200 Minutes Intravenous  Once 10/16/20 1706 10/16/20 2141   10/15/20 2030  piperacillin-tazobactam (ZOSYN) IVPB 3.375 g        3.375 g 12.5 mL/hr over 240 Minutes Intravenous Every 8 hours 10/15/20 1925 10/19/20 2358   10/12/20 2200  cephALEXin (KEFLEX) capsule 1,000 mg  Status:  Discontinued        1,000 mg Oral Every 8 hours 10/12/20 1416 10/15/20 1917   10/12/20 1600  fluconazole (DIFLUCAN) tablet 200 mg        200 mg Oral Every 24 hours 10/12/20 1417 10/17/20 1559   10/11/20 1600  fluconazole (DIFLUCAN) tablet 400 mg  Status:  Discontinued        400 mg Oral Every 24 hours 10/10/20 1519 10/12/20 1417   10/10/20 1615  fluconazole (DIFLUCAN) tablet 800 mg        800 mg Oral  Once 10/10/20 1519 10/10/20 1641   10/10/20 1000  cefTRIAXone (ROCEPHIN) 2 g in sodium chloride 0.9 % 100 mL IVPB  Status:  Discontinued        2 g 200 mL/hr over 30 Minutes Intravenous Every 24 hours 10/09/20 1236 10/09/20 1408   10/10/20 0000  vancomycin (VANCOREADY) IVPB 1750 mg/350 mL  Status:  Discontinued        1,750 mg 175 mL/hr over 120 Minutes Intravenous Every 12 hours 10/09/20 1722 10/12/20 1415   10/09/20 1430  vancomycin (VANCOREADY) IVPB 1500 mg/300 mL  Status:  Discontinued        1,500 mg 150 mL/hr over 120  Minutes Intravenous  Once 10/09/20 1414 10/10/20 0120   10/09/20 1145  cefTRIAXone (ROCEPHIN) 1 g in sodium chloride 0.9 % 100 mL IVPB        1 g 200 mL/hr over 30 Minutes Intravenous  Once 10/09/20 1139 10/09/20 1356   10/09/20 1145  vancomycin (VANCOCIN) IVPB 1000 mg/200 mL premix        1,000 mg 200 mL/hr over 60 Minutes Intravenous  Once 10/09/20 1139 10/09/20 1656        Subjective: Has some shortness of breath earlier today.  No abdominal pain. Objective: Vitals:   11/12/20 0039 11/12/20 0511 11/12/20 0803 11/12/20 1522  BP:  116/82 129/72 (!) 161/76  Pulse: 95 82 80 91  Resp:  16 (!) 24 16  Temp:  98.7 F (37.1 C) 98.5 F (36.9 C) 98 F (36.7 C)  TempSrc:  Oral Oral Oral  SpO2: 97% 98% 99% 98%  Weight:  Height:        Intake/Output Summary (Last 24 hours) at 11/12/2020 2004 Last data filed at 11/12/2020 1700 Gross per 24 hour  Intake 960 ml  Output 1300 ml  Net -340 ml   Filed Weights   10/09/20 0807 10/15/20 1600  Weight: (!) 197.3 kg (!) 201.6 kg    Examination:  General exam: Alert, awake, oriented x 3 Respiratory system: Clear to auscultation. Respiratory effort normal. Cardiovascular system:RRR. No murmurs, rubs, gallops. Gastrointestinal system: Abdomen is nondistended, soft and nontender. No organomegaly or masses felt. Normal bowel sounds heard. Central nervous system: Alert and oriented. No focal neurological deficits. Extremities: No C/C/E, +pedal pulses Skin: No rashes, lesions or ulcers Psychiatry: Judgement and insight appear normal. Mood & affect appropriate.    Data Reviewed: I have personally reviewed following labs and imaging studies  CBC: Recent Labs  Lab 11/11/20 0455  WBC 7.0  NEUTROABS 3.8  HGB 11.4*  HCT 37.5*  MCV 86.2  PLT 260   Basic Metabolic Panel: Recent Labs  Lab 11/11/20 0455  NA 138  K 3.9  CL 99  CO2 30  GLUCOSE 88  BUN 14  CREATININE 0.76  CALCIUM 8.4*  MG 2.3   GFR: Estimated Creatinine  Clearance: 214.3 mL/min (by C-G formula based on SCr of 0.76 mg/dL). Liver Function Tests: No results for input(s): AST, ALT, ALKPHOS, BILITOT, PROT, ALBUMIN in the last 168 hours. No results for input(s): LIPASE, AMYLASE in the last 168 hours. No results for input(s): AMMONIA in the last 168 hours. Coagulation Profile: No results for input(s): INR, PROTIME in the last 168 hours. Cardiac Enzymes: No results for input(s): CKTOTAL, CKMB, CKMBINDEX, TROPONINI in the last 168 hours. BNP (last 3 results) No results for input(s): PROBNP in the last 8760 hours. HbA1C: No results for input(s): HGBA1C in the last 72 hours. CBG: No results for input(s): GLUCAP in the last 168 hours.  Lipid Profile: No results for input(s): CHOL, HDL, LDLCALC, TRIG, CHOLHDL, LDLDIRECT in the last 72 hours. Thyroid Function Tests: No results for input(s): TSH, T4TOTAL, FREET4, T3FREE, THYROIDAB in the last 72 hours. Anemia Panel: No results for input(s): VITAMINB12, FOLATE, FERRITIN, TIBC, IRON, RETICCTPCT in the last 72 hours.  Sepsis Labs: No results for input(s): PROCALCITON, LATICACIDVEN in the last 168 hours.  No results found for this or any previous visit (from the past 240 hour(s)).       Radiology Studies: No results found.      Scheduled Meds:  vitamin C  500 mg Oral BID   collagenase   Topical Daily   enoxaparin (LOVENOX) injection  100 mg Subcutaneous Q24H   feeding supplement  237 mL Oral TID BM   folic acid  1 mg Oral Daily   furosemide  40 mg Oral Daily   hydrocerin   Topical BID   iron polysaccharides  150 mg Oral Daily   multivitamin with minerals  1 tablet Oral Daily   potassium chloride  20 mEq Oral BID   vitamin B-12  100 mcg Oral Daily   Vitamin D (Ergocalciferol)  50,000 Units Oral Q7 days   Continuous Infusions:        Erick Blinks, MD [facility] 11/12/2020, 8:04 PM

## 2020-11-12 NOTE — Progress Notes (Signed)
Mobility Specialist - Progress Note   11/12/20 1600  Mobility  Activity Ambulated in room  Level of Assistance Standby assist, set-up cues, supervision of patient - no hands on  Assistive Device Front wheel walker  Distance Ambulated (ft) 40 ft  Mobility Ambulated with assistance in room  Mobility Response Tolerated well  Mobility performed by Mobility specialist  $Mobility charge 1 Mobility    Pre-mobility: 90 HR, 94% SpO2 During mobility: 105 HR, 87-92% SpO2 Post-mobility: 95 HR, 95% SpO2   Pt sitting in recliner on arrival, utilizing 2L. O2 resting on RA = 94%, desat to 87% with ambulation. O2 increased to low 90s when bumped to 2L during activity. Pt returned to recliner, alarm set. urine discarded.    Filiberto Pinks Mobility Specialist 11/12/20, 4:52 PM

## 2020-11-13 MED ORDER — FUROSEMIDE 10 MG/ML IJ SOLN
40.0000 mg | Freq: Two times a day (BID) | INTRAMUSCULAR | Status: DC
  Administered 2020-11-14 – 2020-11-19 (×11): 40 mg via INTRAVENOUS
  Filled 2020-11-13 (×11): qty 4

## 2020-11-13 NOTE — Progress Notes (Signed)
Patient ID: Christian [name], male   DOB: 01/05/1976, 45 y.o.   MRN: [id number]  PROGRESS NOTE    Jamear Carbonneau  YTK:[id number] DOB: [birth year] DOA: 10/09/2020 PCP: Pcp, No   Brief Narrative:  45 y.o. male with a history of morbid obesity, hypertension presented with  lower abdominal pain with evidence of abdominal wall cellulitis and also found to have lower extremity cellulitis.  He was treated with antibiotics with improvement.  During the hospitalization, he developed pneumoperitoneum with cecal perforation requiring surgical repair.  Patient was also found to be hypoxic and hypercapnic requiring BiPAP at night.  Patient is currently stable for discharge.   Assessment & Plan:   Pneumoperitoneum with cecal perforation -Underwent diagnostic laparoscopy converted to open laparotomy on 10/15/2020 with repair of bowel perforation.  Patient has completed IV antibiotic course.  General surgery following intermittently for wound care.  Currently stable.  Abdominal wall cellulitis/lower extremity cellulitis -Has completed course of IV and oral antibiotics along with antifungals.  Severe sepsis: Present on admission; resolved. -Hemodynamically stable currently  Right pleural effusion -Noted on chest x-ray -Check BNP -We will start on IV Lasix -Monitor intake and output   Morbid obesity -Outpatient follow-up  Acute respiratory failure with hypoxia and hypercapnia -PCO2 up to 81 on ABG from 10/16/2020.  Currently requiring BiPAP at night.  No official diagnosis but possibly related to OSA/OHS.  Continue BiPAP nightly.  AKI -Resolved.  Weakness/generalized deconditioning -PT recommending SNF.  Social worker following.  Chronic normocytic anemia Iron deficiency anemia -Hemoglobin stable currently. -Continue iron supplementation  Poor social situation/homelessness -Child psychotherapist following  Various pressure injuries: Present on admission -I agree with pressure injury  documentation as below.  Continue wound care. Pressure Injury 10/09/20 Buttocks Right Unstageable - Full thickness tissue loss in which the base of the injury is covered by slough (yellow, tan, gray, green or brown) and/or eschar (tan, brown or black) in the wound bed. (Active)  10/09/20 1933  Location: Buttocks  Location Orientation: Right  Staging: Unstageable - Full thickness tissue loss in which the base of the injury is covered by slough (yellow, tan, gray, green or brown) and/or eschar (tan, brown or black) in the wound bed.  Wound Description (Comments):   Present on Admission: Yes     Pressure Injury 10/09/20 Heel Left Stage 2 -  Partial thickness loss of dermis presenting as a shallow open injury with a red, pink wound bed without slough. (Active)  10/09/20   Location: Heel  Location Orientation: Left  Staging: Stage 2 -  Partial thickness loss of dermis presenting as a shallow open injury with a red, pink wound bed without slough.  Wound Description (Comments):   Present on Admission: Yes     Pressure Injury 10/22/20 Thigh Posterior;Proximal;Right Unstageable - Full thickness tissue loss in which the base of the injury is covered by slough (yellow, tan, gray, green or brown) and/or eschar (tan, brown or black) in the wound bed. (Active)  10/22/20 1055  Location: Thigh  Location Orientation: Posterior;Proximal;Right  Staging: Unstageable - Full thickness tissue loss in which the base of the injury is covered by slough (yellow, tan, gray, green or brown) and/or eschar (tan, brown or black) in the wound bed.  Wound Description (Comments):   Present on Admission: Yes (per handoff and report from other RN on unit- wound present on presentation to floor, patient reports wound present on admission- WOCN consulted)     DVT prophylaxis: Lovenox Code Status: Full Family Communication:  None at bedside Disposition Plan: Status is: Inpatient  Remains inpatient appropriate because:Unsafe  d/c plan  Dispo: The patient is from: Home              Anticipated d/c is to: SNF              Patient currently is medically stable to d/c.   Difficult to place patient Yes  Consultants: General surgery  Procedures: Diagnostic laparoscopy converted to open laparotomy with repair of bowel perforation  Antimicrobials:  Anti-infectives (From admission, onward)    Start     Dose/Rate Route Frequency Ordered Stop   10/17/20 1800  anidulafungin (ERAXIS) 100 mg in sodium chloride 0.9 % 100 mL IVPB  Status:  Discontinued       See Hyperspace for full Linked Orders Report.   100 mg 78 mL/hr over 100 Minutes Intravenous Every 24 hours 10/16/20 1706 10/18/20 1221   10/16/20 1800  anidulafungin (ERAXIS) 200 mg in sodium chloride 0.9 % 200 mL IVPB       See Hyperspace for full Linked Orders Report.   200 mg 78 mL/hr over 200 Minutes Intravenous  Once 10/16/20 1706 10/16/20 2141   10/15/20 2030  piperacillin-tazobactam (ZOSYN) IVPB 3.375 g        3.375 g 12.5 mL/hr over 240 Minutes Intravenous Every 8 hours 10/15/20 1925 10/19/20 2358   10/12/20 2200  cephALEXin (KEFLEX) capsule 1,000 mg  Status:  Discontinued        1,000 mg Oral Every 8 hours 10/12/20 1416 10/15/20 1917   10/12/20 1600  fluconazole (DIFLUCAN) tablet 200 mg        200 mg Oral Every 24 hours 10/12/20 1417 10/17/20 1559   10/11/20 1600  fluconazole (DIFLUCAN) tablet 400 mg  Status:  Discontinued        400 mg Oral Every 24 hours 10/10/20 1519 10/12/20 1417   10/10/20 1615  fluconazole (DIFLUCAN) tablet 800 mg        800 mg Oral  Once 10/10/20 1519 10/10/20 1641   10/10/20 1000  cefTRIAXone (ROCEPHIN) 2 g in sodium chloride 0.9 % 100 mL IVPB  Status:  Discontinued        2 g 200 mL/hr over 30 Minutes Intravenous Every 24 hours 10/09/20 1236 10/09/20 1408   10/10/20 0000  vancomycin (VANCOREADY) IVPB 1750 mg/350 mL  Status:  Discontinued        1,750 mg 175 mL/hr over 120 Minutes Intravenous Every 12 hours 10/09/20 1722  10/12/20 1415   10/09/20 1430  vancomycin (VANCOREADY) IVPB 1500 mg/300 mL  Status:  Discontinued        1,500 mg 150 mL/hr over 120 Minutes Intravenous  Once 10/09/20 1414 10/10/20 0120   10/09/20 1145  cefTRIAXone (ROCEPHIN) 1 g in sodium chloride 0.9 % 100 mL IVPB        1 g 200 mL/hr over 30 Minutes Intravenous  Once 10/09/20 1139 10/09/20 1356   10/09/20 1145  vancomycin (VANCOCIN) IVPB 1000 mg/200 mL premix        1,000 mg 200 mL/hr over 60 Minutes Intravenous  Once 10/09/20 1139 10/09/20 1656        Subjective: Has some shortness of breath earlier today.  No abdominal pain. Objective: Vitals:   11/13/20 0608 11/13/20 0937 11/13/20 0938 11/13/20 1517  BP: 129/74  121/61 (!) 144/83  Pulse: 90  92 90  Resp: 18  20 18   Temp: 98.4 F (36.9 C) 98.1 F (36.7 C)  98.1 F (  36.7 C)  TempSrc: Oral Oral  Oral  SpO2: 94%  95% 96%  Weight:      Height:        Intake/Output Summary (Last 24 hours) at 11/13/2020 2015 Last data filed at 11/13/2020 1942 Gross per 24 hour  Intake 960 ml  Output 900 ml  Net 60 ml   Filed Weights   10/09/20 0807 10/15/20 1600  Weight: (!) 197.3 kg (!) 201.6 kg    Examination:  General exam: Alert, awake, oriented x 3 Respiratory system: Diminished breath sounds at right base respiratory effort normal. Cardiovascular system:RRR. No murmurs, rubs, gallops. Gastrointestinal system: Abdomen is nondistended, soft and nontender. No organomegaly or masses felt. Normal bowel sounds heard. Central nervous system: Alert and oriented. No focal neurological deficits. Extremities: Pitting edema bilaterally Skin: No rashes, lesions or ulcers Psychiatry: Judgement and insight appear normal. Mood & affect appropriate.    Data Reviewed: I have personally reviewed following labs and imaging studies  CBC: Recent Labs  Lab 11/11/20 0455  WBC 7.0  NEUTROABS 3.8  HGB 11.4*  HCT 37.5*  MCV 86.2  PLT 260   Basic Metabolic Panel: Recent Labs  Lab  11/11/20 0455  NA 138  K 3.9  CL 99  CO2 30  GLUCOSE 88  BUN 14  CREATININE 0.76  CALCIUM 8.4*  MG 2.3   GFR: Estimated Creatinine Clearance: 214.3 mL/min (by C-G formula based on SCr of 0.76 mg/dL). Liver Function Tests: No results for input(s): AST, ALT, ALKPHOS, BILITOT, PROT, ALBUMIN in the last 168 hours. No results for input(s): LIPASE, AMYLASE in the last 168 hours. No results for input(s): AMMONIA in the last 168 hours. Coagulation Profile: No results for input(s): INR, PROTIME in the last 168 hours. Cardiac Enzymes: No results for input(s): CKTOTAL, CKMB, CKMBINDEX, TROPONINI in the last 168 hours. BNP (last 3 results) No results for input(s): PROBNP in the last 8760 hours. HbA1C: No results for input(s): HGBA1C in the last 72 hours. CBG: No results for input(s): GLUCAP in the last 168 hours.  Lipid Profile: No results for input(s): CHOL, HDL, LDLCALC, TRIG, CHOLHDL, LDLDIRECT in the last 72 hours. Thyroid Function Tests: No results for input(s): TSH, T4TOTAL, FREET4, T3FREE, THYROIDAB in the last 72 hours. Anemia Panel: No results for input(s): VITAMINB12, FOLATE, FERRITIN, TIBC, IRON, RETICCTPCT in the last 72 hours.  Sepsis Labs: No results for input(s): PROCALCITON, LATICACIDVEN in the last 168 hours.  No results found for this or any previous visit (from the past 240 hour(s)).       Radiology Studies: DG Chest Port 1 View  Result Date: 11/12/2020 CLINICAL DATA:  Shortness of breath EXAM: PORTABLE CHEST 1 VIEW COMPARISON:  10/09/2020 FINDINGS: Cardiomegaly with vascular congestion. Asymmetric hazy opacity right thorax suspected to be secondary to pleural effusion. No pneumothorax is seen. IMPRESSION: Cardiomegaly with vascular congestion. Asymmetric hazy right thorax opacity, suspect layering pleural effusion Electronically Signed   By: Jasmine Pang M.D.   On: 11/12/2020 21:20        Scheduled Meds:  vitamin C  500 mg Oral BID   collagenase    Topical Daily   enoxaparin (LOVENOX) injection  100 mg Subcutaneous Q24H   feeding supplement  237 mL Oral TID BM   folic acid  1 mg Oral Daily   [START ON 11/14/2020] furosemide  40 mg Intravenous BID   hydrocerin   Topical BID   iron polysaccharides  150 mg Oral Daily   multivitamin with minerals  1 tablet Oral Daily   potassium chloride  20 mEq Oral BID   vitamin B-12  100 mcg Oral Daily   Vitamin D (Ergocalciferol)  50,000 Units Oral Q7 days   Continuous Infusions:        Erick Blinks, MD [facility] 11/13/2020, 8:15 PM

## 2020-11-13 NOTE — Progress Notes (Signed)
Physical Therapy Treatment Patient Details Name: Christian [name] MRN: [id number] DOB: [birth year] Today's Date: 11/13/2020   History of Present Illness Pt is a 45 y/o M who presented to ED on 10/09/20 with c/c of bilateral lower abdominal pain & c/o emesis twice that day after eating. Pt found to have abdominal wall purulent cellulitis. Pt admitted with clinical sepsis & AKI. PMH: morbid obesity, HTN. Pt with increased SOB starting 8/18 and CXR ordered with concern for free air. CT showed pneumoperitoneum suspicious for bowel perforation. Pt now s/p diagnostic laparoscopy converted to exploratory laparotomy, primary repair of cecal perforation and t/f to ICU on 8/19.    PT Comments    Pt seated in chair, indicating need to urinate. Pt educated on attempting to stand and hold the urinal. Pt required min-guard for cues on technique and assistance for floor clean up. Pt able to perform pericare without assist. Progressed ambulation to incorporate standing rest breaks rather than seated, pt able to walk around nursing station x 3 standing rest breaks w/ supervision. Pt continues to make progress and maintains motivation to continue walking. Skilled PT intervention is indicated to address deficits in function, mobility, and to return to PLOF as able.  Discharge recommendations remain SNF.     Recommendations for follow up therapy are one component of a multi-disciplinary discharge planning process, led by the attending physician.  Recommendations may be updated based on patient status, additional functional criteria and insurance authorization.  Follow Up Recommendations  SNF;Supervision/Assistance - 24 hour;Supervision for mobility/OOB     Equipment Recommendations  Other (comment);3in1 (PT);Rolling walker with 5" wheels    Recommendations for Other Services       Precautions / Restrictions Precautions Precautions: Fall Precaution Comments: abdominal surgical incision Restrictions Weight  Bearing Restrictions: No     Mobility  Bed Mobility Overal bed mobility: Needs Assistance Bed Mobility: Rolling;Sidelying to Sit Rolling: Min guard Sidelying to sit: Min guard       General bed mobility comments: Pt in recliner    Transfers Overall transfer level: Needs assistance Equipment used: Rolling walker (2 wheeled) Transfers: Sit to/from Stand Sit to Stand: Supervision         General transfer comment: Good eccentric and control  Ambulation/Gait Ambulation/Gait assistance: Supervision Gait Distance (Feet): 190 Feet   Gait Pattern/deviations: Step-through pattern;Trunk flexed     General Gait Details: 3 standing rest breaks, cues for upright posture in order to decrease UE WB support   Stairs             Wheelchair Mobility    Modified Rankin (Stroke Patients Only)       Balance Overall balance assessment: Needs assistance Sitting-balance support: Feet supported Sitting balance-[name]: Good     Standing balance support: During functional activity;Single extremity supported Standing balance-[name]: Fair Standing balance comment: Requires BUE support                            Cognition Arousal/Alertness: Awake/alert Behavior During Therapy: WFL for tasks assessed/performed Overall Cognitive Status: Within Functional Limits for tasks assessed                                        Exercises Other Exercises Other Exercises: Pt performed standing pericare and urination w/ min-gaurd for holding gown Other Exercises: Grooming, sup>sit, sit<>stand, sitting/standing balance/tolerance, transporting items 10  ft x2    General Comments General comments (skin integrity, edema, etc.): 87% on RA during mobility      Pertinent Vitals/Pain Pain Assessment: No/denies pain    Home Living                      Prior Function            PT Goals (current goals can now be found in the care plan  section) Acute Rehab PT Goals Patient Stated Goal: be able to return to work again Progress towards PT goals: Progressing toward goals    Frequency    Min 2X/week      PT Plan Current plan remains appropriate    Co-evaluation              AM-PAC PT "6 Clicks" Mobility   Outcome Measure  Help needed turning from your back to your side while in a flat bed without using bedrails?: A Little Help needed moving from lying on your back to sitting on the side of a flat bed without using bedrails?: A Little Help needed moving to and from a bed to a chair (including a wheelchair)?: A Little Help needed standing up from a chair using your arms (e.g., wheelchair or bedside chair)?: A Little Help needed to walk in hospital room?: A Little Help needed climbing 3-5 steps with a railing? : A Lot 6 Click Score: 17    End of Session Equipment Utilized During Treatment: Gait belt;Oxygen Activity Tolerance: Patient tolerated treatment well Patient left: in chair;with call bell/phone within reach;with chair alarm set Nurse Communication: Mobility status PT Visit Diagnosis: Muscle weakness (generalized) (M62.81);Difficulty in walking, not elsewhere classified (R26.2)     Time: 1638-4665 PT Time Calculation (min) (ACUTE ONLY): 26 min  Charges:                        [employer], SPT

## 2020-11-13 NOTE — Progress Notes (Signed)
Subjective:  CC: Christian [name] is a 45 y.o. male  Hospital stay day 22, 29 Days Post-Op ex-lap cecal perforation repair  HPI: No acute issues .  ROS:  General: Denies weight loss, weight gain, fatigue, fevers, chills, and night sweats. Heart: Denies chest pain, palpitations, racing heart, irregular heartbeat, leg pain or swelling, and decreased activity tolerance. Respiratory: Denies breathing difficulty, shortness of breath, wheezing, cough, and sputum. GI: Denies change in appetite, heartburn, nausea, vomiting, constipation, diarrhea, and blood in stool. GU: Denies difficulty urinating, pain with urinating, urgency, frequency, blood in urine.   Objective:   Temp:  [98 F (36.7 C)-98.4 F (36.9 C)] 98.1 F (36.7 C) (09/16 0937) Pulse Rate:  [90-97] 92 (09/16 0938) Resp:  [16-20] 20 (09/16 0938) BP: (121-161)/(61-76) 121/61 (09/16 0938) SpO2:  [94 %-100 %] 95 % (09/16 0938) FiO2 (%):  [2 %] 2 % (09/15 2124)     Height: 6\' 3"  (190.5 cm) Weight: (!) 201.6 kg BMI (Calculated): 55.55   Intake/Output this shift:   Intake/Output Summary (Last 24 hours) at 11/13/2020 1238 Last data filed at 11/13/2020 1000 Gross per 24 hour  Intake 837 ml  Output 800 ml  Net 37 ml    Constitutional :  alert, cooperative, appears stated age, and no distress  Respiratory:  clear to auscultation bilaterally  Cardiovascular:  regular rate and rhythm  Gastrointestinal: Soft, no guarding. Midline wound dressing with scant bleeding, serosanguinous discharge on packing.  Opening with healthy granulation tissue. 10cm x 1.5cm wide x 4cm deep, with midline opening measuring 4cm.  Tunneling still present cephalad through entire length of original incision.  Skin: Cool and moist.   Psychiatric: Normal affect, non-agitated, not confused       LABS:  CMP Latest Ref Rng & Units 11/11/2020 11/02/2020 11/01/2020  Glucose 70 - 99 mg/dL 88 01/01/2021) 960(A)  BUN 6 - 20 mg/dL 14 17 18   Creatinine 0.61 - 1.24 mg/dL 540(J  8.11  Sodium 135 - 145 mmol/L 138 138 138  Potassium 3.5 - 5.1 mmol/L 3.9 3.9 4.5  Chloride 98 - 111 mmol/L 99 99 99  CO2 22 - 32 mmol/L 30 32 32  Calcium 8.9 - 10.3 mg/dL 9.14) 7.82) 9.5(A)  Total Protein 6.5 - 8.1 g/dL - - -  Total Bilirubin 0.3 - 1.2 mg/dL - - -  Alkaline Phos 38 - 126 U/L - - -  AST 15 - 41 U/L - - -  ALT 0 - 44 U/L - - -   CBC Latest Ref Rng & Units 11/11/2020 11/02/2020 11/01/2020  WBC 4.0 - 10.5 K/uL 7.0 7.5 8.3  Hemoglobin 13.0 - 17.0 g/dL 11.4(L) 11.5(L) 12.0(L)  Hematocrit 39.0 - 52.0 % 37.5(L) 36.4(L) 38.9(L)  Platelets 150 - 400 K/uL 260 359 394    RADS: N/a Assessment:   S/p cecal perforation repair.  Now with old hematoma evacuation and open midline wound.  Wound continues to look clean with decreasing size of old hematoma cavity based on rough measurements with CTA as noted above.     Continue wet-to-dry dressing changes per wound care recs. MAKE SURE WOUND IS PACKED in facilitate closure of wound deep to incision.  Subdermal Suture removed to allow easier packing  Surgery would continue to follow while inhouse.  Next wound check in two weeks.  Call with any additional questions or concerns

## 2020-11-13 NOTE — Plan of Care (Signed)
  Problem: Health Behavior/Discharge Planning: Goal: Ability to manage health-related needs will improve Outcome: Progressing   Problem: Clinical Measurements: Goal: Respiratory complications will improve Outcome: Progressing   Problem: Activity: Goal: Risk for activity intolerance will decrease Outcome: Progressing   Problem: Elimination: Goal: Will not experience complications related to bowel motility Outcome: Progressing   Problem: Elimination: Goal: Will not experience complications related to urinary retention Outcome: Progressing   Problem: Safety: Goal: Ability to remain free from injury will improve Outcome: Progressing   

## 2020-11-13 NOTE — Progress Notes (Addendum)
Occupational Therapy Treatment Patient Details Name: Christian [name] MRN: [id number] DOB: [birth year] Today's Date: 11/13/2020   History of present illness Pt is a 45 y/o M who presented to ED on 10/09/20 with c/c of bilateral lower abdominal pain & c/o emesis twice that day after eating. Pt found to have abdominal wall purulent cellulitis. Pt admitted with clinical sepsis & AKI. PMH: morbid obesity, HTN. Pt with increased SOB starting 8/18 and CXR ordered with concern for free air. CT showed pneumoperitoneum suspicious for bowel perforation. Pt now s/p diagnostic laparoscopy converted to exploratory laparotomy, primary repair of cecal perforation and t/f to ICU on 8/19.   OT comments  Mr Makara was seen for OT treatment on this date, goals continue to be appropriate. Upon arrival to room pt in bed, self-identifies tooth brushing for morning ADLs - continues to require cues for setting personal schedule and initiating ADLs. Pt requires SBA + RW for tooth brushing standing sinkside - trialed with O2 removed, initial 91%on RA, decreasing to 87% following ~5 mins standing tasks requiring 2L [place] to return to 95%. CGA + RW to transport items in room ~10 ft x2 trials  - requires assistance to pick items (x2 towels) from floor as pt unable to complete 10 ft without dropping items. Pt making good progress toward goals. Pt continues to benefit from skilled OT services to maximize return to PLOF and minimize risk of future falls, injury, caregiver burden, and readmission. Will continue to follow POC. Discharge recommendation remains appropriate.     Recommendations for follow up therapy are one component of a multi-disciplinary discharge planning process, led by the attending physician.  Recommendations may be updated based on patient status, additional functional criteria and insurance authorization.    Follow Up Recommendations  SNF;Supervision/Assistance - 24 hour    Equipment Recommendations  Other (comment)  (defer to next veneu of care)    Recommendations for Other Services      Precautions / Restrictions Precautions Precautions: Fall Precaution Comments: multiple pressure wounds, abdominal surgical incision Restrictions Weight Bearing Restrictions: No       Mobility Bed Mobility Overal bed mobility: Needs Assistance Bed Mobility: Rolling;Sidelying to Sit Rolling: Min guard Sidelying to sit: Min guard            Transfers Overall transfer level: Needs assistance Equipment used: Rolling walker (2 wheeled) Transfers: Sit to/from Stand Sit to Stand: Min guard              Balance Overall balance assessment: Needs assistance Sitting-balance support: Feet supported Sitting balance-[name]: Good     Standing balance support: During functional activity;Single extremity supported Standing balance-[name]: Fair                             ADL either performed or assessed with clinical judgement   ADL Overall ADL's : Needs assistance/impaired                                       General ADL Comments: SBA + RW for tooth brushing standing sinkside - trialed with O2 removed, initial 91%on RA, decreasing to 87% following ~5 mins standing tasks requiring 2L [place] to return to 95%. CGA + RW to transport items in room ~10 ft x2 trials  - requires assistance to pick items (x2 towels) from floor as pt unable to complete  10 ft without dropping items.      Cognition Arousal/Alertness: Awake/alert Behavior During Therapy: WFL for tasks assessed/performed Overall Cognitive Status: Within Functional Limits for tasks assessed                                          Exercises Exercises: Other exercises Other Exercises Other Exercises: Pt educated re: daily routine, falls prevention, ECS Other Exercises: Grooming, sup>sit, sit<>stand, sitting/standing balance/tolerance, transporting items 10 ft x2   Shoulder Instructions        General Comments 87% on RA during mobility    Pertinent Vitals/ Pain       Pain Assessment: No/denies pain   Frequency  Min 2X/week        Progress Toward Goals  OT Goals(current goals can now be found in the care plan section)  Progress towards OT goals: Progressing toward goals  Acute Rehab OT Goals Patient Stated Goal: be able to return to work again OT Goal Formulation: With patient Time For Goal Achievement: 11/27/20 Potential to Achieve Goals: Good ADL Goals Pt Will Perform Upper Body Dressing: with supervision;standing Pt Will Perform Lower Body Dressing: with min assist;with adaptive equipment;sit to/from stand Pt Will Transfer to Toilet: with modified independence;ambulating;bedside commode Pt/caregiver will Perform Home Exercise Program: Both right and left upper extremity;With theraband;With written HEP provided;Independently;Increased strength  Plan Discharge plan remains appropriate;Frequency remains appropriate    Co-evaluation                 AM-PAC OT "6 Clicks" Daily Activity     Outcome Measure   Help from another person eating meals?: None Help from another person taking care of personal grooming?: A Little Help from another person toileting, which includes using toliet, bedpan, or urinal?: A Lot Help from another person bathing (including washing, rinsing, drying)?: A Lot Help from another person to put on and taking off regular upper body clothing?: A Little Help from another person to put on and taking off regular lower body clothing?: A Lot 6 Click Score: 16    End of Session Equipment Utilized During Treatment: Rolling walker;Oxygen  OT Visit Diagnosis: Unsteadiness on feet (R26.81);Muscle weakness (generalized) (M62.81)   Activity Tolerance Patient tolerated treatment well   Patient Left in chair;with call bell/phone within reach;with chair alarm set   Nurse Communication Mobility status        Time: 7619-5093 OT Time  Calculation (min): 15 min  Charges: OT General Charges $OT Visit: 1 Visit OT Treatments $Self Care/Home Management : 8-22 mins  Kathie Dike, M.S. OTR/L  11/13/20, 11:05 AM  ascom [phone]

## 2020-11-14 LAB — BASIC METABOLIC PANEL
Anion gap: 9 (ref 5–15)
BUN: 12 mg/dL (ref 6–20)
CO2: 30 mmol/L (ref 22–32)
Calcium: 8.4 mg/dL — ABNORMAL LOW (ref 8.9–10.3)
Chloride: 98 mmol/L (ref 98–111)
Creatinine, Ser: 0.75 mg/dL (ref 0.61–1.24)
GFR, Estimated: >60 mL/min (ref >60)
Glucose, Bld: 99 mg/dL (ref 70–99)
Potassium: 4.2 mmol/L (ref 3.5–5.1)
Sodium: 137 mmol/L (ref 135–145)

## 2020-11-14 LAB — BRAIN NATRIURETIC PEPTIDE: B Natriuretic Peptide: 35.9 pg/mL (ref 0.0–100.0)

## 2020-11-14 NOTE — Progress Notes (Signed)
Patient ID: Christian [name], male   DOB: [birth year], 45 y.o.   MRN: [id number]  PROGRESS NOTE    Christian [name]  [id number] DOB: 04-29-1975 DOA: 10/09/2020 PCP: Pcp, No   Brief Narrative:  45 y.o. male with a history of morbid obesity, hypertension presented with  lower abdominal pain with evidence of abdominal wall cellulitis and also found to have lower extremity cellulitis.  He was treated with antibiotics with improvement.  During the hospitalization, he developed pneumoperitoneum with cecal perforation requiring surgical repair.  Patient was also found to be hypoxic and hypercapnic requiring BiPAP at night.  Patient is currently stable for discharge.   Assessment & Plan:   Pneumoperitoneum with cecal perforation -Underwent diagnostic laparoscopy converted to open laparotomy on 10/15/2020 with repair of bowel perforation.  Patient has completed IV antibiotic course.  General surgery following intermittently for wound care.  Currently stable.  Abdominal wall cellulitis/lower extremity cellulitis -Has completed course of IV and oral antibiotics along with antifungals.  Severe sepsis: Present on admission; resolved. -Hemodynamically stable currently  Right pleural effusion -Noted on chest x-ray -BNP noted to be normal, but may be falsely low in the setting of obesity -We will start on IV Lasix -Monitor intake and output   Morbid obesity -Outpatient follow-up  Acute respiratory failure with hypoxia and hypercapnia -PCO2 up to 81 on ABG from 10/16/2020.  Currently requiring BiPAP at night.  No official diagnosis but possibly related to OSA/OHS.  Continue BiPAP nightly.  AKI -Resolved.  Weakness/generalized deconditioning -PT recommending SNF.  Social worker following.  Chronic normocytic anemia Iron deficiency anemia -Hemoglobin stable currently. -Continue iron supplementation  Poor social situation/homelessness -Child psychotherapist following  Various pressure injuries:  Present on admission -I agree with pressure injury documentation as below.  Continue wound care. Pressure Injury 10/09/20 Buttocks Right Unstageable - Full thickness tissue loss in which the base of the injury is covered by slough (yellow, tan, gray, green or brown) and/or eschar (tan, brown or black) in the wound bed. (Active)  10/09/20 1933  Location: Buttocks  Location Orientation: Right  Staging: Unstageable - Full thickness tissue loss in which the base of the injury is covered by slough (yellow, tan, gray, green or brown) and/or eschar (tan, brown or black) in the wound bed.  Wound Description (Comments):   Present on Admission: Yes     Pressure Injury 10/09/20 Heel Left Stage 2 -  Partial thickness loss of dermis presenting as a shallow open injury with a red, pink wound bed without slough. (Active)  10/09/20   Location: Heel  Location Orientation: Left  Staging: Stage 2 -  Partial thickness loss of dermis presenting as a shallow open injury with a red, pink wound bed without slough.  Wound Description (Comments):   Present on Admission: Yes     Pressure Injury 10/22/20 Thigh Posterior;Proximal;Right Unstageable - Full thickness tissue loss in which the base of the injury is covered by slough (yellow, tan, gray, green or brown) and/or eschar (tan, brown or black) in the wound bed. (Active)  10/22/20 1055  Location: Thigh  Location Orientation: Posterior;Proximal;Right  Staging: Unstageable - Full thickness tissue loss in which the base of the injury is covered by slough (yellow, tan, gray, green or brown) and/or eschar (tan, brown or black) in the wound bed.  Wound Description (Comments):   Present on Admission: Yes (per handoff and report from other RN on unit- wound present on presentation to floor, patient reports wound present on admission- WOCN  consulted)     DVT prophylaxis: Lovenox Code Status: Full Family Communication: None at bedside Disposition Plan: Status is:  Inpatient  Remains inpatient appropriate because:Unsafe d/c plan  Dispo: The patient is from: Home              Anticipated d/c is to: SNF              Patient currently is medically stable to d/c.   Difficult to place patient Yes  Consultants: General surgery  Procedures: Diagnostic laparoscopy converted to open laparotomy with repair of bowel perforation  Antimicrobials:  Anti-infectives (From admission, onward)    Start     Dose/Rate Route Frequency Ordered Stop   10/17/20 1800  anidulafungin (ERAXIS) 100 mg in sodium chloride 0.9 % 100 mL IVPB  Status:  Discontinued       See Hyperspace for full Linked Orders Report.   100 mg 78 mL/hr over 100 Minutes Intravenous Every 24 hours 10/16/20 1706 10/18/20 1221   10/16/20 1800  anidulafungin (ERAXIS) 200 mg in sodium chloride 0.9 % 200 mL IVPB       See Hyperspace for full Linked Orders Report.   200 mg 78 mL/hr over 200 Minutes Intravenous  Once 10/16/20 1706 10/16/20 2141   10/15/20 2030  piperacillin-tazobactam (ZOSYN) IVPB 3.375 g        3.375 g 12.5 mL/hr over 240 Minutes Intravenous Every 8 hours 10/15/20 1925 10/19/20 2358   10/12/20 2200  cephALEXin (KEFLEX) capsule 1,000 mg  Status:  Discontinued        1,000 mg Oral Every 8 hours 10/12/20 1416 10/15/20 1917   10/12/20 1600  fluconazole (DIFLUCAN) tablet 200 mg        200 mg Oral Every 24 hours 10/12/20 1417 10/17/20 1559   10/11/20 1600  fluconazole (DIFLUCAN) tablet 400 mg  Status:  Discontinued        400 mg Oral Every 24 hours 10/10/20 1519 10/12/20 1417   10/10/20 1615  fluconazole (DIFLUCAN) tablet 800 mg        800 mg Oral  Once 10/10/20 1519 10/10/20 1641   10/10/20 1000  cefTRIAXone (ROCEPHIN) 2 g in sodium chloride 0.9 % 100 mL IVPB  Status:  Discontinued        2 g 200 mL/hr over 30 Minutes Intravenous Every 24 hours 10/09/20 1236 10/09/20 1408   10/10/20 0000  vancomycin (VANCOREADY) IVPB 1750 mg/350 mL  Status:  Discontinued        1,750 mg 175 mL/hr over  120 Minutes Intravenous Every 12 hours 10/09/20 1722 10/12/20 1415   10/09/20 1430  vancomycin (VANCOREADY) IVPB 1500 mg/300 mL  Status:  Discontinued        1,500 mg 150 mL/hr over 120 Minutes Intravenous  Once 10/09/20 1414 10/10/20 0120   10/09/20 1145  cefTRIAXone (ROCEPHIN) 1 g in sodium chloride 0.9 % 100 mL IVPB        1 g 200 mL/hr over 30 Minutes Intravenous  Once 10/09/20 1139 10/09/20 1356   10/09/20 1145  vancomycin (VANCOCIN) IVPB 1000 mg/200 mL premix        1,000 mg 200 mL/hr over 60 Minutes Intravenous  Once 10/09/20 1139 10/09/20 1656        Subjective: Noted to have some shortness of breath in conversation.  No abdominal pain. Objective: Vitals:   11/13/20 1517 11/13/20 2019 11/14/20 0900 11/14/20 1616  BP: (!) 144/83 135/80 128/79 129/73  Pulse: 90 90 97 94  Resp: 18 18  17 17  Temp: 98.1 F (36.7 C) 98.1 F (36.7 C) 98.1 F (36.7 C) 98 F (36.7 C)  TempSrc: Oral     SpO2: 96% 96% 99% 96%  Weight:      Height:        Intake/Output Summary (Last 24 hours) at 11/14/2020 1848 Last data filed at 11/14/2020 1641 Gross per 24 hour  Intake --  Output 1650 ml  Net -1650 ml   Filed Weights   10/09/20 0807 10/15/20 1600  Weight: (!) 197.3 kg (!) 201.6 kg    Examination:  General exam: Alert, awake, oriented x 3 Respiratory system: Diminished breath sounds at right base respiratory effort normal. Cardiovascular system:RRR. No murmurs, rubs, gallops. Gastrointestinal system: Abdomen is nondistended, soft and nontender. No organomegaly or masses felt. Normal bowel sounds heard. Central nervous system: Alert and oriented. No focal neurological deficits. Extremities: Pitting edema bilaterally Skin: No rashes, lesions or ulcers Psychiatry: Judgement and insight appear normal. Mood & affect appropriate.    Data Reviewed: I have personally reviewed following labs and imaging studies  CBC: Recent Labs  Lab 11/11/20 0455  WBC 7.0  NEUTROABS 3.8  HGB 11.4*   HCT 37.5*  MCV 86.2  PLT 260   Basic Metabolic Panel: Recent Labs  Lab 11/11/20 0455 11/14/20 0517  NA 138 137  K 3.9 4.2  CL 99 98  CO2 30 30  GLUCOSE 88 99  BUN 14 12  CREATININE 0.76 0.75  CALCIUM 8.4* 8.4*  MG 2.3  --    GFR: Estimated Creatinine Clearance: 214.3 mL/min (by C-G formula based on SCr of 0.75 mg/dL). Liver Function Tests: No results for input(s): AST, ALT, ALKPHOS, BILITOT, PROT, ALBUMIN in the last 168 hours. No results for input(s): LIPASE, AMYLASE in the last 168 hours. No results for input(s): AMMONIA in the last 168 hours. Coagulation Profile: No results for input(s): INR, PROTIME in the last 168 hours. Cardiac Enzymes: No results for input(s): CKTOTAL, CKMB, CKMBINDEX, TROPONINI in the last 168 hours. BNP (last 3 results) No results for input(s): PROBNP in the last 8760 hours. HbA1C: No results for input(s): HGBA1C in the last 72 hours. CBG: No results for input(s): GLUCAP in the last 168 hours.  Lipid Profile: No results for input(s): CHOL, HDL, LDLCALC, TRIG, CHOLHDL, LDLDIRECT in the last 72 hours. Thyroid Function Tests: No results for input(s): TSH, T4TOTAL, FREET4, T3FREE, THYROIDAB in the last 72 hours. Anemia Panel: No results for input(s): VITAMINB12, FOLATE, FERRITIN, TIBC, IRON, RETICCTPCT in the last 72 hours.  Sepsis Labs: No results for input(s): PROCALCITON, LATICACIDVEN in the last 168 hours.  No results found for this or any previous visit (from the past 240 hour(s)).       Radiology Studies: DG Chest Port 1 View  Result Date: 11/12/2020 CLINICAL DATA:  Shortness of breath EXAM: PORTABLE CHEST 1 VIEW COMPARISON:  10/09/2020 FINDINGS: Cardiomegaly with vascular congestion. Asymmetric hazy opacity right thorax suspected to be secondary to pleural effusion. No pneumothorax is seen. IMPRESSION: Cardiomegaly with vascular congestion. Asymmetric hazy right thorax opacity, suspect layering pleural effusion Electronically  Signed   By: Jasmine Pang M.D.   On: 11/12/2020 21:20        Scheduled Meds:  vitamin C  500 mg Oral BID   collagenase   Topical Daily   enoxaparin (LOVENOX) injection  100 mg Subcutaneous Q24H   feeding supplement  237 mL Oral TID BM   folic acid  1 mg Oral Daily   furosemide  40 mg Intravenous BID   hydrocerin   Topical BID   iron polysaccharides  150 mg Oral Daily   multivitamin with minerals  1 tablet Oral Daily   potassium chloride  20 mEq Oral BID   vitamin B-12  100 mcg Oral Daily   Vitamin D (Ergocalciferol)  50,000 Units Oral Q7 days   Continuous Infusions:        Erick Blinks, MD [facility] 11/14/2020, 6:48 PM

## 2020-11-14 NOTE — Progress Notes (Signed)
Mobility Specialist - Progress Note   11/14/20 1500  Mobility  Activity Ambulated in hall  Level of Assistance Standby assist, set-up cues, supervision of patient - no hands on  Assistive Device Front wheel walker  Distance Ambulated (ft) 100 ft  Mobility Ambulated with assistance in hallway  Mobility Response Tolerated well  Mobility performed by Mobility specialist  $Mobility charge 1 Mobility    During mobility: 117 HR, 92% SpO2 Post-mobility: 108 HR, 93% SpO2   Pt ambulated in hallway with supervision. 1 standing rest break taken. O2 maintained > 88% on 2L. Pt returned to recliner after session. 9oz of urine discarded.    Filiberto Pinks Mobility Specialist 11/14/20, 3:10 PM

## 2020-11-15 LAB — BASIC METABOLIC PANEL
Anion gap: 7 (ref 5–15)
BUN: 14 mg/dL (ref 6–20)
CO2: 35 mmol/L — ABNORMAL HIGH (ref 22–32)
Calcium: 8.5 mg/dL — ABNORMAL LOW (ref 8.9–10.3)
Chloride: 95 mmol/L — ABNORMAL LOW (ref 98–111)
Creatinine, Ser: 0.81 mg/dL (ref 0.61–1.24)
GFR, Estimated: >60 mL/min (ref >60)
Glucose, Bld: 109 mg/dL — ABNORMAL HIGH (ref 70–99)
Potassium: 3.8 mmol/L (ref 3.5–5.1)
Sodium: 137 mmol/L (ref 135–145)

## 2020-11-15 LAB — CBC
HCT: 37.9 % — ABNORMAL LOW (ref 39.0–52.0)
Hemoglobin: 12 g/dL — ABNORMAL LOW (ref 13.0–17.0)
MCH: 26.7 pg (ref 26.0–34.0)
MCHC: 31.7 g/dL (ref 30.0–36.0)
MCV: 84.2 fL (ref 80.0–100.0)
Platelets: 290 10*3/uL (ref 150–400)
RBC: 4.5 MIL/uL (ref 4.22–5.81)
RDW: 14.5 % (ref 11.5–15.5)
WBC: 9.8 10*3/uL (ref 4.0–10.5)
nRBC: 0 % (ref 0.0–0.2)

## 2020-11-15 NOTE — Progress Notes (Signed)
Patient ID: Christian [name], male   DOB: 25-Mar-1975, 45 y.o.   MRN: [id number]  PROGRESS NOTE    Christian [name]  [id number] DOB: [birth year] DOA: 10/09/2020 PCP: Pcp, No   Brief Narrative:  45 y.o. male with a history of morbid obesity, hypertension presented with  lower abdominal pain with evidence of abdominal wall cellulitis and also found to have lower extremity cellulitis.  He was treated with antibiotics with improvement.  During the hospitalization, he developed pneumoperitoneum with cecal perforation requiring surgical repair.  Patient was also found to be hypoxic and hypercapnic requiring BiPAP at night.  Patient is currently stable for discharge.   Assessment & Plan:   Pneumoperitoneum with cecal perforation -Underwent diagnostic laparoscopy converted to open laparotomy on 10/15/2020 with repair of bowel perforation.  Patient has completed IV antibiotic course.  General surgery following intermittently for wound care.  Currently stable.  Abdominal wall cellulitis/lower extremity cellulitis -Has completed course of IV and oral antibiotics along with antifungals.  Severe sepsis: Present on admission; resolved. -Hemodynamically stable currently  Right pleural effusion -Noted on chest x-ray -BNP noted to be normal, but may be falsely low in the setting of obesity -Started on IV Lasix with good urine output -Monitor intake and output -Repeat chest x-ray in a.m. and if persistent effusion present, can consider thoracentesis   Morbid obesity -Outpatient follow-up  Acute respiratory failure with hypoxia and hypercapnia -PCO2 up to 81 on ABG from 10/16/2020.  Currently requiring BiPAP at night.  No official diagnosis but possibly related to OSA/OHS.  Continue BiPAP nightly.  AKI -Resolved.  Weakness/generalized deconditioning -PT recommending SNF.  Social worker following.  Chronic normocytic anemia Iron deficiency anemia -Hemoglobin stable currently. -Continue iron  supplementation  Poor social situation/homelessness -Child psychotherapist following  Various pressure injuries: Present on admission -I agree with pressure injury documentation as below.  Continue wound care. Pressure Injury 10/09/20 Buttocks Right Unstageable - Full thickness tissue loss in which the base of the injury is covered by slough (yellow, tan, gray, green or brown) and/or eschar (tan, brown or black) in the wound bed. (Active)  10/09/20 1933  Location: Buttocks  Location Orientation: Right  Staging: Unstageable - Full thickness tissue loss in which the base of the injury is covered by slough (yellow, tan, gray, green or brown) and/or eschar (tan, brown or black) in the wound bed.  Wound Description (Comments):   Present on Admission: Yes     Pressure Injury 10/09/20 Heel Left Stage 2 -  Partial thickness loss of dermis presenting as a shallow open injury with a red, pink wound bed without slough. (Active)  10/09/20   Location: Heel  Location Orientation: Left  Staging: Stage 2 -  Partial thickness loss of dermis presenting as a shallow open injury with a red, pink wound bed without slough.  Wound Description (Comments):   Present on Admission: Yes     Pressure Injury 10/22/20 Thigh Posterior;Proximal;Right Unstageable - Full thickness tissue loss in which the base of the injury is covered by slough (yellow, tan, gray, green or brown) and/or eschar (tan, brown or black) in the wound bed. (Active)  10/22/20 1055  Location: Thigh  Location Orientation: Posterior;Proximal;Right  Staging: Unstageable - Full thickness tissue loss in which the base of the injury is covered by slough (yellow, tan, gray, green or brown) and/or eschar (tan, brown or black) in the wound bed.  Wound Description (Comments):   Present on Admission: Yes (per handoff and report from other RN  on unit- wound present on presentation to floor, patient reports wound present on admission- WOCN consulted)     DVT  prophylaxis: Lovenox Code Status: Full Family Communication: None at bedside Disposition Plan: Status is: Inpatient  Remains inpatient appropriate because:Unsafe d/c plan  Dispo: The patient is from: Home              Anticipated d/c is to: SNF              Patient currently is medically stable to d/c.   Difficult to place patient Yes  Consultants: General surgery  Procedures: Diagnostic laparoscopy converted to open laparotomy with repair of bowel perforation  Antimicrobials:  Anti-infectives (From admission, onward)    Start     Dose/Rate Route Frequency Ordered Stop   10/17/20 1800  anidulafungin (ERAXIS) 100 mg in sodium chloride 0.9 % 100 mL IVPB  Status:  Discontinued       See Hyperspace for full Linked Orders Report.   100 mg 78 mL/hr over 100 Minutes Intravenous Every 24 hours 10/16/20 1706 10/18/20 1221   10/16/20 1800  anidulafungin (ERAXIS) 200 mg in sodium chloride 0.9 % 200 mL IVPB       See Hyperspace for full Linked Orders Report.   200 mg 78 mL/hr over 200 Minutes Intravenous  Once 10/16/20 1706 10/16/20 2141   10/15/20 2030  piperacillin-tazobactam (ZOSYN) IVPB 3.375 g        3.375 g 12.5 mL/hr over 240 Minutes Intravenous Every 8 hours 10/15/20 1925 10/19/20 2358   10/12/20 2200  cephALEXin (KEFLEX) capsule 1,000 mg  Status:  Discontinued        1,000 mg Oral Every 8 hours 10/12/20 1416 10/15/20 1917   10/12/20 1600  fluconazole (DIFLUCAN) tablet 200 mg        200 mg Oral Every 24 hours 10/12/20 1417 10/17/20 1559   10/11/20 1600  fluconazole (DIFLUCAN) tablet 400 mg  Status:  Discontinued        400 mg Oral Every 24 hours 10/10/20 1519 10/12/20 1417   10/10/20 1615  fluconazole (DIFLUCAN) tablet 800 mg        800 mg Oral  Once 10/10/20 1519 10/10/20 1641   10/10/20 1000  cefTRIAXone (ROCEPHIN) 2 g in sodium chloride 0.9 % 100 mL IVPB  Status:  Discontinued        2 g 200 mL/hr over 30 Minutes Intravenous Every 24 hours 10/09/20 1236 10/09/20 1408    10/10/20 0000  vancomycin (VANCOREADY) IVPB 1750 mg/350 mL  Status:  Discontinued        1,750 mg 175 mL/hr over 120 Minutes Intravenous Every 12 hours 10/09/20 1722 10/12/20 1415   10/09/20 1430  vancomycin (VANCOREADY) IVPB 1500 mg/300 mL  Status:  Discontinued        1,500 mg 150 mL/hr over 120 Minutes Intravenous  Once 10/09/20 1414 10/10/20 0120   10/09/20 1145  cefTRIAXone (ROCEPHIN) 1 g in sodium chloride 0.9 % 100 mL IVPB        1 g 200 mL/hr over 30 Minutes Intravenous  Once 10/09/20 1139 10/09/20 1356   10/09/20 1145  vancomycin (VANCOCIN) IVPB 1000 mg/200 mL premix        1,000 mg 200 mL/hr over 60 Minutes Intravenous  Once 10/09/20 1139 10/09/20 1656        Subjective: Reports having some orthopnea last night.  Improved after head of bed was raised. Objective: Vitals:   11/15/20 3299 11/15/20 0511 11/15/20 0754 11/15/20 1614  BP: (!) 141/76 130/61 121/70 138/80  Pulse: 81 84 74 94  Resp: 18 18 20 20   Temp: 98.6 F (37 C) 98.2 F (36.8 C) (!) 96.9 F (36.1 C) 98.3 F (36.8 C)  TempSrc:  Oral Axillary Oral  SpO2: 100% 100% 100% 97%  Weight:      Height:        Intake/Output Summary (Last 24 hours) at 11/15/2020 1953 Last data filed at 11/15/2020 1250 Gross per 24 hour  Intake --  Output 2700 ml  Net -2700 ml   Filed Weights   10/09/20 0807 10/15/20 1600  Weight: (!) 197.3 kg (!) 201.6 kg    Examination:  General exam: Alert, awake, oriented x 3 Respiratory system: Diminished breath sounds at right base respiratory effort normal. Cardiovascular system:RRR. No murmurs, rubs, gallops. Gastrointestinal system: Abdomen is nondistended, soft and nontender. No organomegaly or masses felt. Normal bowel sounds heard. Central nervous system: Alert and oriented. No focal neurological deficits. Extremities: Pitting edema bilaterally Skin: No rashes, lesions or ulcers Psychiatry: Judgement and insight appear normal. Mood & affect appropriate.    Data Reviewed: I  have personally reviewed following labs and imaging studies  CBC: Recent Labs  Lab 11/11/20 0455 11/15/20 0514  WBC 7.0 9.8  NEUTROABS 3.8  --   HGB 11.4* 12.0*  HCT 37.5* 37.9*  MCV 86.2 84.2  PLT 260 290   Basic Metabolic Panel: Recent Labs  Lab 11/11/20 0455 11/14/20 0517 11/15/20 0514  NA 138 137 137  K 3.9 4.2 3.8  CL 99 98 95*  CO2 30 30 35*  GLUCOSE 88 99 109*  BUN 14 12 14   CREATININE 0.76 0.75 0.81  CALCIUM 8.4* 8.4* 8.5*  MG 2.3  --   --    GFR: Estimated Creatinine Clearance: 211.6 mL/min (by C-G formula based on SCr of 0.81 mg/dL). Liver Function Tests: No results for input(s): AST, ALT, ALKPHOS, BILITOT, PROT, ALBUMIN in the last 168 hours. No results for input(s): LIPASE, AMYLASE in the last 168 hours. No results for input(s): AMMONIA in the last 168 hours. Coagulation Profile: No results for input(s): INR, PROTIME in the last 168 hours. Cardiac Enzymes: No results for input(s): CKTOTAL, CKMB, CKMBINDEX, TROPONINI in the last 168 hours. BNP (last 3 results) No results for input(s): PROBNP in the last 8760 hours. HbA1C: No results for input(s): HGBA1C in the last 72 hours. CBG: No results for input(s): GLUCAP in the last 168 hours.  Lipid Profile: No results for input(s): CHOL, HDL, LDLCALC, TRIG, CHOLHDL, LDLDIRECT in the last 72 hours. Thyroid Function Tests: No results for input(s): TSH, T4TOTAL, FREET4, T3FREE, THYROIDAB in the last 72 hours. Anemia Panel: No results for input(s): VITAMINB12, FOLATE, FERRITIN, TIBC, IRON, RETICCTPCT in the last 72 hours.  Sepsis Labs: No results for input(s): PROCALCITON, LATICACIDVEN in the last 168 hours.  No results found for this or any previous visit (from the past 240 hour(s)).       Radiology Studies: No results found.      Scheduled Meds:  vitamin C  500 mg Oral BID   collagenase   Topical Daily   enoxaparin (LOVENOX) injection  100 mg Subcutaneous Q24H   feeding supplement  237 mL  Oral TID BM   folic acid  1 mg Oral Daily   furosemide  40 mg Intravenous BID   hydrocerin   Topical BID   iron polysaccharides  150 mg Oral Daily   multivitamin with minerals  1 tablet Oral Daily  potassium chloride  20 mEq Oral BID   vitamin B-12  100 mcg Oral Daily   Vitamin D (Ergocalciferol)  50,000 Units Oral Q7 days   Continuous Infusions:        Erick Blinks, MD [facility] 11/15/2020, 7:53 PM

## 2020-11-15 NOTE — Plan of Care (Signed)
  Problem: Clinical Measurements: Goal: Ability to maintain clinical measurements within normal limits will improve Outcome: Progressing Goal: Will remain free from infection Outcome: Progressing Goal: Diagnostic test results will improve Outcome: Progressing Goal: Respiratory complications will improve Outcome: Progressing Goal: Cardiovascular complication will be avoided Outcome: Progressing   Problem: Pain Managment: Goal: General experience of comfort will improve Outcome: Progressing   Pt is involved in and agrees with the plan of care. V/S stable. Still on oxygen at 2.5lpm/[place] with sats at 96%. Wears bipap at night. Complained of pain on his back and right chest; pt felt like muscle pain - relieved by tylenol and head of bed elevated. Dressing changed on his abdominal incision, posterior thigh and buttocks. Pt voiding well.

## 2020-11-16 ENCOUNTER — Inpatient Hospital Stay: Payer: Self-pay

## 2020-11-16 LAB — BODY FLUID CELL COUNT WITH DIFFERENTIAL
Eos, Fluid: 2 %
Lymphs, Fluid: 77 %
Monocyte-Macrophage-Serous Fluid: 5 %
Neutrophil Count, Fluid: 16 %
Total Nucleated Cell Count, Fluid: 3129 cu mm

## 2020-11-16 LAB — BASIC METABOLIC PANEL
Anion gap: 7 (ref 5–15)
BUN: 14 mg/dL (ref 6–20)
CO2: 34 mmol/L — ABNORMAL HIGH (ref 22–32)
Calcium: 8.4 mg/dL — ABNORMAL LOW (ref 8.9–10.3)
Chloride: 95 mmol/L — ABNORMAL LOW (ref 98–111)
Creatinine, Ser: 0.84 mg/dL (ref 0.61–1.24)
GFR, Estimated: >60 mL/min (ref >60)
Glucose, Bld: 104 mg/dL — ABNORMAL HIGH (ref 70–99)
Potassium: 4 mmol/L (ref 3.5–5.1)
Sodium: 136 mmol/L (ref 135–145)

## 2020-11-16 LAB — PROTEIN, PLEURAL OR PERITONEAL FLUID: Total protein, fluid: 4.4 g/dL

## 2020-11-16 NOTE — Progress Notes (Signed)
Patient ID: Christian [name], male   DOB: 12/01/1975, 45 y.o.   MRN: [id number]  PROGRESS NOTE    Christian [name]  QPY:[id number] DOB: 07/31/1975 DOA: 10/09/2020 PCP: Pcp, No   Brief Narrative:  45 y.o. male with a history of morbid obesity, hypertension presented with  lower abdominal pain with evidence of abdominal wall cellulitis and also found to have lower extremity cellulitis.  He was treated with antibiotics with improvement.  During the hospitalization, he developed pneumoperitoneum with cecal perforation requiring surgical repair.  Patient was also found to be hypoxic and hypercapnic requiring BiPAP at night.  Patient is currently stable for discharge.   Assessment & Plan:   Pneumoperitoneum with cecal perforation -Underwent diagnostic laparoscopy converted to open laparotomy on 10/15/2020 with repair of bowel perforation.  Patient has completed IV antibiotic course.  General surgery following intermittently for wound care.  Currently stable.  Abdominal wall cellulitis/lower extremity cellulitis -Has completed course of IV and oral antibiotics along with antifungals.  Severe sepsis: Present on admission; resolved. -Hemodynamically stable currently  Right pleural effusion -Noted on chest x-ray -BNP noted to be normal, but may be falsely low in the setting of obesity -Started on IV Lasix with good urine output -Monitor intake and output -Repeat chest x-ray today shows persistent pleural effusion, will request thoracentesis   Morbid obesity -Outpatient follow-up  Acute respiratory failure with hypoxia and hypercapnia -PCO2 up to 81 on ABG from 10/16/2020.  Currently requiring BiPAP at night.  No official diagnosis but possibly related to OSA/OHS.  Continue BiPAP nightly.  AKI -Resolved.  Weakness/generalized deconditioning -PT recommending SNF.  Social worker following.  Chronic normocytic anemia Iron deficiency anemia -Hemoglobin stable currently. -Continue iron  supplementation  Poor social situation/homelessness -Child psychotherapist following  Various pressure injuries: Present on admission -I agree with pressure injury documentation as below.  Continue wound care. Pressure Injury 10/09/20 Buttocks Right Unstageable - Full thickness tissue loss in which the base of the injury is covered by slough (yellow, tan, gray, green or brown) and/or eschar (tan, brown or black) in the wound bed. (Active)  10/09/20 1933  Location: Buttocks  Location Orientation: Right  Staging: Unstageable - Full thickness tissue loss in which the base of the injury is covered by slough (yellow, tan, gray, green or brown) and/or eschar (tan, brown or black) in the wound bed.  Wound Description (Comments):   Present on Admission: Yes     Pressure Injury 10/09/20 Heel Left Stage 2 -  Partial thickness loss of dermis presenting as a shallow open injury with a red, pink wound bed without slough. (Active)  10/09/20   Location: Heel  Location Orientation: Left  Staging: Stage 2 -  Partial thickness loss of dermis presenting as a shallow open injury with a red, pink wound bed without slough.  Wound Description (Comments):   Present on Admission: Yes     Pressure Injury 10/22/20 Thigh Posterior;Proximal;Right Unstageable - Full thickness tissue loss in which the base of the injury is covered by slough (yellow, tan, gray, green or brown) and/or eschar (tan, brown or black) in the wound bed. (Active)  10/22/20 1055  Location: Thigh  Location Orientation: Posterior;Proximal;Right  Staging: Unstageable - Full thickness tissue loss in which the base of the injury is covered by slough (yellow, tan, gray, green or brown) and/or eschar (tan, brown or black) in the wound bed.  Wound Description (Comments):   Present on Admission: Yes (per handoff and report from other RN on unit-  wound present on presentation to floor, patient reports wound present on admission- WOCN consulted)     DVT  prophylaxis: Lovenox Code Status: Full Family Communication: None at bedside Disposition Plan: Status is: Inpatient  Remains inpatient appropriate because:Unsafe d/c plan  Dispo: The patient is from: Home              Anticipated d/c is to: SNF              Patient currently is medically stable to d/c.   Difficult to place patient Yes  Consultants: General surgery  Procedures: Diagnostic laparoscopy converted to open laparotomy with repair of bowel perforation  Antimicrobials:  Anti-infectives (From admission, onward)    Start     Dose/Rate Route Frequency Ordered Stop   10/17/20 1800  anidulafungin (ERAXIS) 100 mg in sodium chloride 0.9 % 100 mL IVPB  Status:  Discontinued       See Hyperspace for full Linked Orders Report.   100 mg 78 mL/hr over 100 Minutes Intravenous Every 24 hours 10/16/20 1706 10/18/20 1221   10/16/20 1800  anidulafungin (ERAXIS) 200 mg in sodium chloride 0.9 % 200 mL IVPB       See Hyperspace for full Linked Orders Report.   200 mg 78 mL/hr over 200 Minutes Intravenous  Once 10/16/20 1706 10/16/20 2141   10/15/20 2030  piperacillin-tazobactam (ZOSYN) IVPB 3.375 g        3.375 g 12.5 mL/hr over 240 Minutes Intravenous Every 8 hours 10/15/20 1925 10/19/20 2358   10/12/20 2200  cephALEXin (KEFLEX) capsule 1,000 mg  Status:  Discontinued        1,000 mg Oral Every 8 hours 10/12/20 1416 10/15/20 1917   10/12/20 1600  fluconazole (DIFLUCAN) tablet 200 mg        200 mg Oral Every 24 hours 10/12/20 1417 10/17/20 1559   10/11/20 1600  fluconazole (DIFLUCAN) tablet 400 mg  Status:  Discontinued        400 mg Oral Every 24 hours 10/10/20 1519 10/12/20 1417   10/10/20 1615  fluconazole (DIFLUCAN) tablet 800 mg        800 mg Oral  Once 10/10/20 1519 10/10/20 1641   10/10/20 1000  cefTRIAXone (ROCEPHIN) 2 g in sodium chloride 0.9 % 100 mL IVPB  Status:  Discontinued        2 g 200 mL/hr over 30 Minutes Intravenous Every 24 hours 10/09/20 1236 10/09/20 1408    10/10/20 0000  vancomycin (VANCOREADY) IVPB 1750 mg/350 mL  Status:  Discontinued        1,750 mg 175 mL/hr over 120 Minutes Intravenous Every 12 hours 10/09/20 1722 10/12/20 1415   10/09/20 1430  vancomycin (VANCOREADY) IVPB 1500 mg/300 mL  Status:  Discontinued        1,500 mg 150 mL/hr over 120 Minutes Intravenous  Once 10/09/20 1414 10/10/20 0120   10/09/20 1145  cefTRIAXone (ROCEPHIN) 1 g in sodium chloride 0.9 % 100 mL IVPB        1 g 200 mL/hr over 30 Minutes Intravenous  Once 10/09/20 1139 10/09/20 1356   10/09/20 1145  vancomycin (VANCOCIN) IVPB 1000 mg/200 mL premix        1,000 mg 200 mL/hr over 60 Minutes Intravenous  Once 10/09/20 1139 10/09/20 1656        Subjective: Reports continued shortness of breath, worse when laying down Objective: Vitals:   11/16/20 1520 11/16/20 1533 11/16/20 1540 11/16/20 1727  BP: (!) 155/119 (!) 166/102 (!) 165/90  137/84  Pulse:    (!) 104  Resp:    18  Temp:    98.6 F (37 C)  TempSrc:      SpO2:    97%  Weight:      Height:        Intake/Output Summary (Last 24 hours) at 11/16/2020 2024 Last data filed at 11/16/2020 1859 Gross per 24 hour  Intake 1200 ml  Output 2775 ml  Net -1575 ml   Filed Weights   10/09/20 0807 10/15/20 1600  Weight: (!) 197.3 kg (!) 201.6 kg    Examination:  General exam: Alert, awake, oriented x 3 Respiratory system: Diminished breath sounds at right base respiratory effort normal. Cardiovascular system:RRR. No murmurs, rubs, gallops. Gastrointestinal system: Abdomen is nondistended, soft and nontender. No organomegaly or masses felt. Normal bowel sounds heard. Central nervous system: Alert and oriented. No focal neurological deficits. Extremities: Pitting edema bilaterally Skin: No rashes, lesions or ulcers Psychiatry: Judgement and insight appear normal. Mood & affect appropriate.    Data Reviewed: I have personally reviewed following labs and imaging studies  CBC: Recent Labs  Lab  11/11/20 0455 11/15/20 0514  WBC 7.0 9.8  NEUTROABS 3.8  --   HGB 11.4* 12.0*  HCT 37.5* 37.9*  MCV 86.2 84.2  PLT 260 290   Basic Metabolic Panel: Recent Labs  Lab 11/11/20 0455 11/14/20 0517 11/15/20 0514 11/16/20 0514  NA 138 137 137 136  K 3.9 4.2 3.8 4.0  CL 99 98 95* 95*  CO2 30 30 35* 34*  GLUCOSE 88 99 109* 104*  BUN 14 12 14 14   CREATININE 0.76 0.75 0.81 0.84  CALCIUM 8.4* 8.4* 8.5* 8.4*  MG 2.3  --   --   --    GFR: Estimated Creatinine Clearance: 204.1 mL/min (by C-G formula based on SCr of 0.84 mg/dL). Liver Function Tests: No results for input(s): AST, ALT, ALKPHOS, BILITOT, PROT, ALBUMIN in the last 168 hours. No results for input(s): LIPASE, AMYLASE in the last 168 hours. No results for input(s): AMMONIA in the last 168 hours. Coagulation Profile: No results for input(s): INR, PROTIME in the last 168 hours. Cardiac Enzymes: No results for input(s): CKTOTAL, CKMB, CKMBINDEX, TROPONINI in the last 168 hours. BNP (last 3 results) No results for input(s): PROBNP in the last 8760 hours. HbA1C: No results for input(s): HGBA1C in the last 72 hours. CBG: No results for input(s): GLUCAP in the last 168 hours.  Lipid Profile: No results for input(s): CHOL, HDL, LDLCALC, TRIG, CHOLHDL, LDLDIRECT in the last 72 hours. Thyroid Function Tests: No results for input(s): TSH, T4TOTAL, FREET4, T3FREE, THYROIDAB in the last 72 hours. Anemia Panel: No results for input(s): VITAMINB12, FOLATE, FERRITIN, TIBC, IRON, RETICCTPCT in the last 72 hours.  Sepsis Labs: No results for input(s): PROCALCITON, LATICACIDVEN in the last 168 hours.  No results found for this or any previous visit (from the past 240 hour(s)).       Radiology Studies: DG Chest 1 View  Result Date: 11/16/2020 CLINICAL DATA:  Status post thoracentesis EXAM: CHEST  1 VIEW COMPARISON:  Chest radiograph 11/16/2020 FINDINGS: The cardiomediastinal silhouette is stable, allowing for rightward patient  rotation. The right pleural effusion has significantly decreased in size following thoracentesis. There is no evidence of pneumothorax. Aeration at the left lung is unchanged. There is no left pleural effusion or pneumothorax. The bones are stable. IMPRESSION: Significant interval decrease in size of the right pleural effusion following thoracentesis. No evidence of pneumothorax.  Electronically Signed   By: Lesia Hausen M.D.   On: 11/16/2020 16:18   DG Chest Port 1 View  Result Date: 11/16/2020 CLINICAL DATA:  Pleural effusion EXAM: PORTABLE CHEST 1 VIEW COMPARISON:  10/15/2020 FINDINGS: Cardiac enlargement with central pulmonary vascular congestion. Right pleural effusion with basilar atelectasis or infiltration. This may indicate pneumonia. No pneumothorax. Mediastinal contours appear intact. IMPRESSION: Cardiac enlargement with central vascular congestion. Right pleural effusion with infiltration or atelectasis in the right base. Electronically Signed   By: Burman Nieves M.D.   On: 11/16/2020 04:01   US THORACENTESIS ASP PLEURAL SPACE W/IMG GUIDE  Result Date: 11/16/2020 INDICATION: Patient with a history of respiratory failure presents today with a right pleural effusion. Interventional radiology asked to perform a diagnostic and therapeutic thoracentesis. EXAM: ULTRASOUND GUIDED THORACENTESIS MEDICATIONS: 1% lidocaine 10 mL COMPLICATIONS: None immediate. PROCEDURE: An ultrasound guided thoracentesis was thoroughly discussed with the patient and questions answered. The benefits, risks, alternatives and complications were also discussed. The patient understands and wishes to proceed with the procedure. Written consent was obtained. Ultrasound was performed to localize and mark an adequate pocket of fluid in the right chest. The area was then prepped and draped in the normal sterile fashion. 1% Lidocaine was used for local anesthesia. Under ultrasound guidance a 6 Fr Safe-T-Centesis catheter was  introduced. Thoracentesis was performed. The catheter was removed and a dressing applied. FINDINGS: A total of approximately 2.2 L of amber-colored fluid was removed. Samples were sent to the laboratory as requested by the clinical team. IMPRESSION: Successful ultrasound guided right thoracentesis yielding 2.2 L of pleural fluid. Read by: Alwyn Ren, NP Electronically Signed   By: Simonne Come M.D.   On: 11/16/2020 16:10        Scheduled Meds:  vitamin C  500 mg Oral BID   collagenase   Topical Daily   enoxaparin (LOVENOX) injection  100 mg Subcutaneous Q24H   feeding supplement  237 mL Oral TID BM   folic acid  1 mg Oral Daily   furosemide  40 mg Intravenous BID   hydrocerin   Topical BID   iron polysaccharides  150 mg Oral Daily   multivitamin with minerals  1 tablet Oral Daily   potassium chloride  20 mEq Oral BID   vitamin B-12  100 mcg Oral Daily   Vitamin D (Ergocalciferol)  50,000 Units Oral Q7 days   Continuous Infusions:        Erick Blinks, MD [facility] 11/16/2020, 8:24 PM

## 2020-11-16 NOTE — Progress Notes (Signed)
OT Cancellation Note  Patient Details Name: Christian [name] MRN: [id number] DOB: 1975-10-10   Cancelled Treatment:    Reason Eval/Treat Not Completed: Patient at procedure or test/ unavailable. Upon arrival to room, pt being transported for thoracentesis. OT to re-attempt at later time/date as able.   Matthew Folks, OTR/L [facility] [phone]

## 2020-11-16 NOTE — Plan of Care (Signed)
  Problem: Clinical Measurements: Goal: Respiratory complications will improve Outcome: Progressing   Problem: Activity: Goal: Risk for activity intolerance will decrease Outcome: Progressing   Problem: Pain Managment: Goal: General experience of comfort will improve Outcome: Progressing   Problem: Safety: Goal: Ability to remain free from injury will improve Outcome: Progressing   

## 2020-11-16 NOTE — Procedures (Signed)
PROCEDURE SUMMARY:  Successful US guided right thoracentesis. Yielded 2.2 L of amber-colored fluid. Pt tolerated procedure well. No immediate complications.  Specimen sent for labs. CXR ordered; results pending  EBL < 2 mL  Mickie Kay, NP 11/16/2020 3:45 PM

## 2020-11-17 LAB — BASIC METABOLIC PANEL
Anion gap: 7 (ref 5–15)
BUN: 16 mg/dL (ref 6–20)
CO2: 37 mmol/L — ABNORMAL HIGH (ref 22–32)
Calcium: 8.3 mg/dL — ABNORMAL LOW (ref 8.9–10.3)
Chloride: 94 mmol/L — ABNORMAL LOW (ref 98–111)
Creatinine, Ser: 0.89 mg/dL (ref 0.61–1.24)
GFR, Estimated: >60 mL/min (ref >60)
Glucose, Bld: 114 mg/dL — ABNORMAL HIGH (ref 70–99)
Potassium: 3.5 mmol/L (ref 3.5–5.1)
Sodium: 138 mmol/L (ref 135–145)

## 2020-11-17 NOTE — Progress Notes (Signed)
Physical Therapy Treatment Patient Details Name: Christian [name] MRN: [id number] DOB: [birth year] Today's Date: 11/17/2020   History of Present Illness Pt is a 45 y/o M who presented to ED on 10/09/20 with c/c of bilateral lower abdominal pain & c/o emesis twice that day after eating. Pt found to have abdominal wall purulent cellulitis. Pt admitted with clinical sepsis & AKI. PMH: morbid obesity, HTN. Pt with increased SOB starting 8/18 and CXR ordered with concern for free air. CT showed pneumoperitoneum suspicious for bowel perforation. Pt now s/p diagnostic laparoscopy converted to exploratory laparotomy, primary repair of cecal perforation and t/f to ICU on 8/19.    PT Comments    Pt alert, seated in recliner, eager to continue mobility work. Increased coughing with audible lung congestion noted during inhalation leading to decreased ambulation compared to previous treatment sessions. SpO2 92% with ambulation, RW, SUP with two seated rest breaks. Pt continues to demonstrate good eccentric and concentric control with transfers, RW, SUP. Skilled PT intervention is indicated to address deficits in function, mobility, and to return to PLOF as able.  Discharge recommendations remain SNF to improve functional mobility.    Recommendations for follow up therapy are one component of a multi-disciplinary discharge planning process, led by the attending physician.  Recommendations may be updated based on patient status, additional functional criteria and insurance authorization.  Follow Up Recommendations  SNF;Supervision/Assistance - 24 hour;Supervision for mobility/OOB     Equipment Recommendations  Other (comment);3in1 (PT);Rolling walker with 5" wheels    Recommendations for Other Services       Precautions / Restrictions Precautions Precautions: Fall Restrictions Weight Bearing Restrictions: No     Mobility  Bed Mobility               General bed mobility comments: Pt in  recliner    Transfers Overall transfer level: Needs assistance Equipment used: Rolling walker (2 wheeled)   Sit to Stand: Supervision         General transfer comment: Good eccentric and control  Ambulation/Gait Ambulation/Gait assistance: Supervision Gait Distance (Feet): 60 Feet Assistive device: Rolling walker (2 wheeled) Gait Pattern/deviations: Wide base of support;Trunk flexed     General Gait Details: Decreased gait speed secondary to cough, increased fatigue   Stairs             Wheelchair Mobility    Modified Rankin (Stroke Patients Only)       Balance Overall balance assessment: Needs assistance Sitting-balance support: Feet supported Sitting balance-[name]: Good       Standing balance-[name]: Fair Standing balance comment: Requires BUE support                            Cognition Arousal/Alertness: Awake/alert Behavior During Therapy: WFL for tasks assessed/performed Overall Cognitive Status: Within Functional Limits for tasks assessed                                        Exercises Other Exercises Other Exercises: Pt education on breathing exercises due to lung congestion    General Comments        Pertinent Vitals/Pain Pain Assessment: Faces Faces Pain Scale: Hurts a little bit Pain Location: R thorax Pain Descriptors / Indicators: Sore Pain Intervention(s): Limited activity within patient's tolerance;Monitored during session;Repositioned    Home Living  Prior Function            PT Goals (current goals can now be found in the care plan section) Progress towards PT goals: Progressing toward goals    Frequency    Min 2X/week      PT Plan Current plan remains appropriate    Co-evaluation              AM-PAC PT "6 Clicks" Mobility   Outcome Measure  Help needed turning from your back to your side while in a flat bed without using bedrails?:  A Little Help needed moving from lying on your back to sitting on the side of a flat bed without using bedrails?: A Little Help needed moving to and from a bed to a chair (including a wheelchair)?: A Little Help needed standing up from a chair using your arms (e.g., wheelchair or bedside chair)?: A Little Help needed to walk in hospital room?: A Little Help needed climbing 3-5 steps with a railing? : A Lot 6 Click Score: 17    End of Session Equipment Utilized During Treatment: Gait belt;Oxygen Activity Tolerance: Patient tolerated treatment well Patient left: in chair;with call bell/phone within reach;with chair alarm set Nurse Communication: Mobility status PT Visit Diagnosis: Muscle weakness (generalized) (M62.81);Difficulty in walking, not elsewhere classified (R26.2)     Time: 4166-0630 PT Time Calculation (min) (ACUTE ONLY): 33 min  Charges:                       [employer], SPT

## 2020-11-17 NOTE — Progress Notes (Signed)
Nutrition Follow-up  DOCUMENTATION CODES:   Morbid obesity  INTERVENTION:   Ensure Enlive po TID, each supplement provides 350 kcal and 20 grams of protein  MVI po daily   Vitamin C 559m po BID   Double protein with meals   NUTRITION DIAGNOSIS:   Increased nutrient needs related to wound healing as evidenced by estimated needs.  GOAL:   Patient will meet greater than or equal to 90% of their needs -met   MONITOR:   PO intake, Supplement acceptance, Labs, Weight trends, Skin, I & O's  ASSESSMENT:   45yo male with a PMH of morbid obesity and HTN who presents with severe sepsis 2/2 abdominal wall and bilateral lower extremity cellulitis. Also with pneumoperitoneum with cecal perforation (s/p ex lap) and AKI (now resolved).  Pt continues to have good appetite and oral intake in hospital; pt eating 100% of meals in hospital and is drinking Ensure supplements. Pt is also receiving double protein with meal trays. Juven discontinued r/t backorder; vitamin C added to support wound healing. No new weight since 8/18.   Medications reviewed and include: vitamin C, lovenox, lasix, folic acid, iron polysaccharides, MVI, KCl, vitamin D, B12  Labs reviewed: K 3.5 wnl  Diet Order:   Diet Order             Diet Heart Room service appropriate? Yes; Fluid consistency: Thin  Diet effective 1400                  EDUCATION NEEDS:   Education needs have been addressed  Skin:  Skin Assessment: Skin Integrity Issues: Skin Integrity Issues:: Stage II, Unstageable, Incisions Stage II: L heel, R heel, R foot Unstageable: L buttocks and R buttocks Incisions: Abdomen, closed  Last BM:  9/18- type 4  Height:   Ht Readings from Last 1 Encounters:  10/09/20 6' 3"  (1.905 m)    Weight:   Wt Readings from Last 1 Encounters:  10/15/20 (!) 201.6 kg   BMI:  Body mass index is 55.55 kg/m.  Estimated Nutritional Needs:   Kcal:  3200-3500kcal/day  Protein:  >160g/day  Fluid:   2.7-3.0L/day  CKoleen DistanceMS, RD, LDN Please refer to [facility] Hospitalfor RD and/or RD on-call/weekend/after hours pager

## 2020-11-17 NOTE — Progress Notes (Addendum)
Patient ID: Christian [name], male   DOB: [birth year], 45 y.o.   MRN: [id number]  PROGRESS NOTE    Christian [name]  BTD:[id number] DOB: [birth year] DOA: 10/09/2020 PCP: Pcp, No   Brief Narrative:  45 y.o. male with a history of morbid obesity, hypertension presented with  lower abdominal pain with evidence of abdominal wall cellulitis and also found to have lower extremity cellulitis.  He was treated with antibiotics with improvement.  During the hospitalization, he developed pneumoperitoneum with cecal perforation requiring surgical repair.  Patient was also found to be hypoxic and hypercapnic requiring BiPAP at night.  Patient is currently stable for discharge.   Assessment & Plan:   Pneumoperitoneum with cecal perforation -Underwent diagnostic laparoscopy converted to open laparotomy on 10/15/2020 with repair of bowel perforation.  Patient has completed IV antibiotic course.  General surgery following intermittently for wound care.  Currently stable.  Abdominal wall cellulitis/lower extremity cellulitis -Has completed course of IV and oral antibiotics along with antifungals.  Severe sepsis: Present on admission; resolved. -Hemodynamically stable currently  Right pleural effusion -Noted on chest x-ray -BNP noted to be normal, but may be falsely low in the setting of obesity -Started on IV Lasix with good urine output -Monitor intake and output -Patient underwent thoracentesis on 9/19 with removal of 2 L of fluid   Morbid obesity -Outpatient follow-up  Chronic hypoxic and hypercapnic respiratory failure -Patient noted to have ABG with pH of 7.39 indicating compensated pH, elevated PCO2 of 69 and elevated serum bicarb of 41.  This would indicate Chronic hypercapnic respiratory failure and he would benefit with nocturnal positive pressure therapy. -I suspect his chronic respiratory failure is secondary to obesity hypoventilation/obstructive sleep apnea -We will check echocardiogram to  evaluate for cor pulmonale  AKI -Resolved.  Weakness/generalized deconditioning -PT recommending SNF.  Social worker following.  Chronic normocytic anemia Iron deficiency anemia -Hemoglobin stable currently. -Continue iron supplementation  Poor social situation/homelessness -Child psychotherapist following  Various pressure injuries: Present on admission -I agree with pressure injury documentation as below.  Continue wound care. Pressure Injury 10/09/20 Buttocks Right Unstageable - Full thickness tissue loss in which the base of the injury is covered by slough (yellow, tan, gray, green or brown) and/or eschar (tan, brown or black) in the wound bed. (Active)  10/09/20 1933  Location: Buttocks  Location Orientation: Right  Staging: Unstageable - Full thickness tissue loss in which the base of the injury is covered by slough (yellow, tan, gray, green or brown) and/or eschar (tan, brown or black) in the wound bed.  Wound Description (Comments):   Present on Admission: Yes     Pressure Injury 10/09/20 Heel Left Stage 2 -  Partial thickness loss of dermis presenting as a shallow open injury with a red, pink wound bed without slough. (Active)  10/09/20   Location: Heel  Location Orientation: Left  Staging: Stage 2 -  Partial thickness loss of dermis presenting as a shallow open injury with a red, pink wound bed without slough.  Wound Description (Comments):   Present on Admission: Yes     Pressure Injury 10/22/20 Thigh Posterior;Proximal;Right Unstageable - Full thickness tissue loss in which the base of the injury is covered by slough (yellow, tan, gray, green or brown) and/or eschar (tan, brown or black) in the wound bed. (Active)  10/22/20 1055  Location: Thigh  Location Orientation: Posterior;Proximal;Right  Staging: Unstageable - Full thickness tissue loss in which the base of the injury is covered by slough (yellow,  tan, gray, green or brown) and/or eschar (tan, brown or black) in the  wound bed.  Wound Description (Comments):   Present on Admission: Yes (per handoff and report from other RN on unit- wound present on presentation to floor, patient reports wound present on admission- WOCN consulted)     DVT prophylaxis: Lovenox Code Status: Full Family Communication: None at bedside Disposition Plan: Status is: Inpatient  Remains inpatient appropriate because:Unsafe d/c plan  Dispo: The patient is from: Home              Anticipated d/c is to: SNF              Patient currently is medically stable to d/c.   Difficult to place patient Yes  Consultants: General surgery  Procedures: Diagnostic laparoscopy converted to open laparotomy with repair of bowel perforation  Antimicrobials:  Anti-infectives (From admission, onward)    Start     Dose/Rate Route Frequency Ordered Stop   10/17/20 1800  anidulafungin (ERAXIS) 100 mg in sodium chloride 0.9 % 100 mL IVPB  Status:  Discontinued       See Hyperspace for full Linked Orders Report.   100 mg 78 mL/hr over 100 Minutes Intravenous Every 24 hours 10/16/20 1706 10/18/20 1221   10/16/20 1800  anidulafungin (ERAXIS) 200 mg in sodium chloride 0.9 % 200 mL IVPB       See Hyperspace for full Linked Orders Report.   200 mg 78 mL/hr over 200 Minutes Intravenous  Once 10/16/20 1706 10/16/20 2141   10/15/20 2030  piperacillin-tazobactam (ZOSYN) IVPB 3.375 g        3.375 g 12.5 mL/hr over 240 Minutes Intravenous Every 8 hours 10/15/20 1925 10/19/20 2358   10/12/20 2200  cephALEXin (KEFLEX) capsule 1,000 mg  Status:  Discontinued        1,000 mg Oral Every 8 hours 10/12/20 1416 10/15/20 1917   10/12/20 1600  fluconazole (DIFLUCAN) tablet 200 mg        200 mg Oral Every 24 hours 10/12/20 1417 10/17/20 1559   10/11/20 1600  fluconazole (DIFLUCAN) tablet 400 mg  Status:  Discontinued        400 mg Oral Every 24 hours 10/10/20 1519 10/12/20 1417   10/10/20 1615  fluconazole (DIFLUCAN) tablet 800 mg        800 mg Oral  Once  10/10/20 1519 10/10/20 1641   10/10/20 1000  cefTRIAXone (ROCEPHIN) 2 g in sodium chloride 0.9 % 100 mL IVPB  Status:  Discontinued        2 g 200 mL/hr over 30 Minutes Intravenous Every 24 hours 10/09/20 1236 10/09/20 1408   10/10/20 0000  vancomycin (VANCOREADY) IVPB 1750 mg/350 mL  Status:  Discontinued        1,750 mg 175 mL/hr over 120 Minutes Intravenous Every 12 hours 10/09/20 1722 10/12/20 1415   10/09/20 1430  vancomycin (VANCOREADY) IVPB 1500 mg/300 mL  Status:  Discontinued        1,500 mg 150 mL/hr over 120 Minutes Intravenous  Once 10/09/20 1414 10/10/20 0120   10/09/20 1145  cefTRIAXone (ROCEPHIN) 1 g in sodium chloride 0.9 % 100 mL IVPB        1 g 200 mL/hr over 30 Minutes Intravenous  Once 10/09/20 1139 10/09/20 1356   10/09/20 1145  vancomycin (VANCOCIN) IVPB 1000 mg/200 mL premix        1,000 mg 200 mL/hr over 60 Minutes Intravenous  Once 10/09/20 1139 10/09/20 1656  Subjective: Reports that orthopnea is better after thoracentesis yesterday.  Still gets short of breath at times with exertion.   Objective: Vitals:   11/16/20 2326 11/17/20 0630 11/17/20 0810 11/17/20 1543  BP:  (!) 119/59 113/71 133/72  Pulse: (!) 106 87 85 (!) 106  Resp:  (!) 25 18 18   Temp:  97.9 F (36.6 C) 98.4 F (36.9 C) 99.2 F (37.3 C)  TempSrc:  Oral Oral Oral  SpO2: 99% 99% 99% (!) 88%  Weight:      Height:        Intake/Output Summary (Last 24 hours) at 11/17/2020 1904 Last data filed at 11/17/2020 1300 Gross per 24 hour  Intake 1200 ml  Output 2825 ml  Net -1625 ml   Filed Weights   10/09/20 0807 10/15/20 1600  Weight: (!) 197.3 kg (!) 201.6 kg    Examination:  General exam: Alert, awake, oriented x 3 Respiratory system: Diminished breath sounds at right base respiratory effort normal. Cardiovascular system:RRR. No murmurs, rubs, gallops. Gastrointestinal system: Abdomen is nondistended, soft and nontender. No organomegaly or masses felt. Normal bowel sounds  heard. Central nervous system: Alert and oriented. No focal neurological deficits. Extremities: Pitting edema bilaterally Skin: No rashes, lesions or ulcers Psychiatry: Judgement and insight appear normal. Mood & affect appropriate.    Data Reviewed: I have personally reviewed following labs and imaging studies  CBC: Recent Labs  Lab 11/11/20 0455 11/15/20 0514  WBC 7.0 9.8  NEUTROABS 3.8  --   HGB 11.4* 12.0*  HCT 37.5* 37.9*  MCV 86.2 84.2  PLT 260 290   Basic Metabolic Panel: Recent Labs  Lab 11/11/20 0455 11/14/20 0517 11/15/20 0514 11/16/20 0514 11/17/20 0448  NA 138 137 137 136 138  K 3.9 4.2 3.8 4.0 3.5  CL 99 98 95* 95* 94*  CO2 30 30 35* 34* 37*  GLUCOSE 88 99 109* 104* 114*  BUN 14 12 14 14 16   CREATININE 0.76 0.75 0.81 0.84 0.89  CALCIUM 8.4* 8.4* 8.5* 8.4* 8.3*  MG 2.3  --   --   --   --    GFR: Estimated Creatinine Clearance: 192.6 mL/min (by C-G formula based on SCr of 0.89 mg/dL). Liver Function Tests: No results for input(s): AST, ALT, ALKPHOS, BILITOT, PROT, ALBUMIN in the last 168 hours. No results for input(s): LIPASE, AMYLASE in the last 168 hours. No results for input(s): AMMONIA in the last 168 hours. Coagulation Profile: No results for input(s): INR, PROTIME in the last 168 hours. Cardiac Enzymes: No results for input(s): CKTOTAL, CKMB, CKMBINDEX, TROPONINI in the last 168 hours. BNP (last 3 results) No results for input(s): PROBNP in the last 8760 hours. HbA1C: No results for input(s): HGBA1C in the last 72 hours. CBG: No results for input(s): GLUCAP in the last 168 hours.  Lipid Profile: No results for input(s): CHOL, HDL, LDLCALC, TRIG, CHOLHDL, LDLDIRECT in the last 72 hours. Thyroid Function Tests: No results for input(s): TSH, T4TOTAL, FREET4, T3FREE, THYROIDAB in the last 72 hours. Anemia Panel: No results for input(s): VITAMINB12, FOLATE, FERRITIN, TIBC, IRON, RETICCTPCT in the last 72 hours.  Sepsis Labs: No results for  input(s): PROCALCITON, LATICACIDVEN in the last 168 hours.  Recent Results (from the past 240 hour(s))  Body fluid culture w Gram Stain     Status: None (Preliminary result)   Collection Time: 11/16/20  3:51 PM   Specimen: PATH Cytology Pleural fluid  Result Value Ref Range Status   Specimen Description  Final    PLEURAL Performed at [facility], [address] [place], [place] [address]    Special Requests   Final    NONE Performed at [facility], [address] [place], [place] [address]    Gram Stain   Final    MODERATE WBC PRESENT, PREDOMINANTLY MONONUCLEAR NO ORGANISMS SEEN    Culture   Final    NO GROWTH < 24 HOURS Performed at [facility], [address] [place], [place] [address]    Report Status PENDING  Incomplete         Radiology Studies: DG Chest 1 View  Result Date: 11/16/2020 CLINICAL DATA:  Status post thoracentesis EXAM: CHEST  1 VIEW COMPARISON:  Chest radiograph 11/16/2020 FINDINGS: The cardiomediastinal silhouette is stable, allowing for rightward patient rotation. The right pleural effusion has significantly decreased in size following thoracentesis. There is no evidence of pneumothorax. Aeration at the left lung is unchanged. There is no left pleural effusion or pneumothorax. The bones are stable. IMPRESSION: Significant interval decrease in size of the right pleural effusion following thoracentesis. No evidence of pneumothorax. Electronically Signed   By: Lesia Hausen M.D.   On: 11/16/2020 16:18   DG Chest Port 1 View  Result Date: 11/16/2020 CLINICAL DATA:  Pleural effusion EXAM: PORTABLE CHEST 1 VIEW COMPARISON:  10/15/2020 FINDINGS: Cardiac enlargement with central pulmonary vascular congestion. Right pleural effusion with basilar atelectasis or infiltration. This may indicate pneumonia. No pneumothorax. Mediastinal contours appear intact. IMPRESSION: Cardiac enlargement with central vascular congestion. Right pleural  effusion with infiltration or atelectasis in the right base. Electronically Signed   By: Burman Nieves M.D.   On: 11/16/2020 04:01   US THORACENTESIS ASP PLEURAL SPACE W/IMG GUIDE  Result Date: 11/16/2020 INDICATION: Patient with a history of respiratory failure presents today with a right pleural effusion. Interventional radiology asked to perform a diagnostic and therapeutic thoracentesis. EXAM: ULTRASOUND GUIDED THORACENTESIS MEDICATIONS: 1% lidocaine 10 mL COMPLICATIONS: None immediate. PROCEDURE: An ultrasound guided thoracentesis was thoroughly discussed with the patient and questions answered. The benefits, risks, alternatives and complications were also discussed. The patient understands and wishes to proceed with the procedure. Written consent was obtained. Ultrasound was performed to localize and mark an adequate pocket of fluid in the right chest. The area was then prepped and draped in the normal sterile fashion. 1% Lidocaine was used for local anesthesia. Under ultrasound guidance a 6 Fr Safe-T-Centesis catheter was introduced. Thoracentesis was performed. The catheter was removed and a dressing applied. FINDINGS: A total of approximately 2.2 L of amber-colored fluid was removed. Samples were sent to the laboratory as requested by the clinical team. IMPRESSION: Successful ultrasound guided right thoracentesis yielding 2.2 L of pleural fluid. Read by: Alwyn Ren, NP Electronically Signed   By: Simonne Come M.D.   On: 11/16/2020 16:10        Scheduled Meds:  vitamin C  500 mg Oral BID   collagenase   Topical Daily   enoxaparin (LOVENOX) injection  100 mg Subcutaneous Q24H   feeding supplement  237 mL Oral TID BM   folic acid  1 mg Oral Daily   furosemide  40 mg Intravenous BID   hydrocerin   Topical BID   iron polysaccharides  150 mg Oral Daily   multivitamin with minerals  1 tablet Oral Daily   potassium chloride  20 mEq Oral BID   vitamin B-12  100 mcg Oral Daily   Vitamin D  (Ergocalciferol)  50,000 Units Oral Q7 days   Continuous Infusions:        Erick Blinks, MD [facility] 11/17/2020, 7:04 PM

## 2020-11-18 ENCOUNTER — Inpatient Hospital Stay (HOSPITAL_COMMUNITY)
Admit: 2020-11-18 | Discharge: 2020-11-18 | Disposition: A | Discharge status: Home / Self Care | Payer: Self-pay | Attending: Internal Medicine | Admitting: Internal Medicine

## 2020-11-18 DIAGNOSIS — I272 Pulmonary hypertension, unspecified: Secondary | ICD-10-CM

## 2020-11-18 LAB — BASIC METABOLIC PANEL
Anion gap: 8 (ref 5–15)
BUN: 16 mg/dL (ref 6–20)
CO2: 36 mmol/L — ABNORMAL HIGH (ref 22–32)
Calcium: 8.5 mg/dL — ABNORMAL LOW (ref 8.9–10.3)
Chloride: 94 mmol/L — ABNORMAL LOW (ref 98–111)
Creatinine, Ser: 0.83 mg/dL (ref 0.61–1.24)
GFR, Estimated: >60 mL/min (ref >60)
Glucose, Bld: 109 mg/dL — ABNORMAL HIGH (ref 70–99)
Potassium: 3.4 mmol/L — ABNORMAL LOW (ref 3.5–5.1)
Sodium: 138 mmol/L (ref 135–145)

## 2020-11-18 LAB — ECHOCARDIOGRAM COMPLETE
Height: 75 in
S' Lateral: 3.2 cm
Weight: 7110.4 oz

## 2020-11-18 LAB — CBC
HCT: 35 % — ABNORMAL LOW (ref 39.0–52.0)
Hemoglobin: 11 g/dL — ABNORMAL LOW (ref 13.0–17.0)
MCH: 25.6 pg — ABNORMAL LOW (ref 26.0–34.0)
MCHC: 31.4 g/dL (ref 30.0–36.0)
MCV: 81.4 fL (ref 80.0–100.0)
Platelets: 315 10*3/uL (ref 150–400)
RBC: 4.3 MIL/uL (ref 4.22–5.81)
RDW: 14.2 % (ref 11.5–15.5)
WBC: 9.6 10*3/uL (ref 4.0–10.5)
nRBC: 0 % (ref 0.0–0.2)

## 2020-11-18 NOTE — TOC Progression Note (Signed)
Transition of Care [facility]) - Progression Note    Patient Details  Name: Christian [name] MRN: [id number] Date of Birth: [birth year]  Transition of Care (TOC) CM/SW Contact  Chapman Fitch, RN Phone Number: 11/18/2020, 9:12 AM  Clinical Narrative:    Per MD note yesterday Chronic hypoxic and hypercapnic respiratory failure -Patient noted to have ABG with pH of 7.39 indicating compensated pH, elevated PCO2 of 69 and elevated serum bicarb of 41.  This would indicate Chronic hypercapnic respiratory failure and he would benefit with nocturnal positive pressure therapy. -I suspect his chronic respiratory failure is secondary to obesity hypoventilation/obstructive sleep apnea -We will check echocardiogram to evaluate for cor pulmonale   I have provided this information to Tommie Ard with [employer] to determine if this would allow patient to qualify for disability      Barriers to Discharge: Continued Medical Work up  Expected Discharge Plan and Services         Living arrangements for the past 2 months: Homeless                                       Social Determinants of Health (SDOH) Interventions    Readmission Risk Interventions Readmission Risk Prevention Plan 10/11/2020  Post Dischage Appt Complete  Medication Screening Complete  Transportation Screening Complete  Some recent data might be hidden

## 2020-11-18 NOTE — Progress Notes (Signed)
*  PRELIMINARY RESULTS* Echocardiogram 2D Echocardiogram has been performed.  Cristela Blue 11/18/2020, 2:28 PM

## 2020-11-18 NOTE — Progress Notes (Signed)
Occupational Therapy Treatment Patient Details Name: Christian [name] MRN: [id number] DOB: [birth year] Today's Date: 11/18/2020   History of present illness Pt is a 45 y/o M who presented to ED on 10/09/20 with c/c of bilateral lower abdominal pain & c/o emesis twice that day after eating. Pt found to have abdominal wall purulent cellulitis. Pt admitted with clinical sepsis & AKI. PMH: morbid obesity, HTN. Pt with increased SOB starting 8/18 and CXR ordered with concern for free air. CT showed pneumoperitoneum suspicious for bowel perforation. Pt now s/p diagnostic laparoscopy converted to exploratory laparotomy, primary repair of cecal perforation and t/f to ICU on 8/19.   OT comments  Mr Breithaupt was seen for OT treatment on this date. Upon arrival to room pt reclined in bed agreeable to tx. SBA + RW for toilet t/f and simulated tub t/f - requires grab bar use for clearing ~3". Pt required MOD A for perihygiene in standing - assist for thoroughness. SpO2 95% on RA for ~5 min standing. Desat 86% on RA for simulated tub t/f, resolved to 90% with seated rest break. Left on 0.5L [place] at 91%, RN aware. Pt making good progress toward goals. Pt continues to benefit from skilled OT services to maximize return to PLOF and minimize risk of future falls, injury, caregiver burden, and readmission. Will continue to follow POC. Discharge recommendation remains appropriate.     Recommendations for follow up therapy are one component of a multi-disciplinary discharge planning process, led by the attending physician.  Recommendations may be updated based on patient status, additional functional criteria and insurance authorization.    Follow Up Recommendations  SNF;Supervision/Assistance - 24 hour    Equipment Recommendations  Other (comment) (TBD)    Recommendations for Other Services      Precautions / Restrictions Precautions Precautions: Fall Precaution Comments: abdominal surgical  incision Restrictions Weight Bearing Restrictions: No       Mobility Bed Mobility               General bed mobility comments: received and left in chair    Transfers Overall transfer level: Needs assistance Equipment used: Rolling walker (2 wheeled) Transfers: Sit to/from Stand Sit to Stand: Supervision              Balance Overall balance assessment: Needs assistance Sitting-balance support: Feet supported Sitting balance-[name]: Good     Standing balance support: During functional activity;Single extremity supported Standing balance-[name]: Fair                             ADL either performed or assessed with clinical judgement   ADL Overall ADL's : Needs assistance/impaired                                       General ADL Comments: SBA + RW for toilet t/f and simulated tub t/f - requires grab bar use for clearing ~3". Pt required MOD A for perihygiene in standing - assist for thoroughness      Cognition Arousal/Alertness: Awake/alert Behavior During Therapy: [facility] for tasks assessed/performed Overall Cognitive Status: Within Functional Limits for tasks assessed                                 General Comments: when asked what is important for treating  wounds pt responded "clean and dry"        Exercises Exercises: Other exercises Other Exercises Other Exercises: Pt educated re; d/c recs, DME recs, Other Exercises: LBD,  toileting, grooming, simulated tub t/f, ~30 ft in room mobility   Shoulder Instructions       General Comments SpO2 95% on RA for ~5 min standing. Desat 86% on RA for simulated tub t/f, resolved to 90% with seated rest break. Left on 0.5L [place] at 91%, RN aware    Pertinent Vitals/ Pain       Pain Assessment: No/denies pain         Frequency  Min 2X/week        Progress Toward Goals  OT Goals(current goals can now be found in the care plan section)  Progress  towards OT goals: Progressing toward goals  Acute Rehab OT Goals Patient Stated Goal: be able to return to work again OT Goal Formulation: With patient Time For Goal Achievement: 11/27/20 Potential to Achieve Goals: Good ADL Goals Pt Will Perform Upper Body Dressing: with supervision;standing Pt Will Perform Lower Body Dressing: with min assist;with adaptive equipment;sit to/from stand Pt Will Transfer to Toilet: with modified independence;ambulating;bedside commode Pt/caregiver will Perform Home Exercise Program: Both right and left upper extremity;With theraband;With written HEP provided;Independently;Increased strength  Plan Discharge plan remains appropriate;Frequency remains appropriate    Co-evaluation                 AM-PAC OT "6 Clicks" Daily Activity     Outcome Measure   Help from another person eating meals?: None Help from another person taking care of personal grooming?: A Little Help from another person toileting, which includes using toliet, bedpan, or urinal?: A Lot Help from another person bathing (including washing, rinsing, drying)?: A Lot Help from another person to put on and taking off regular upper body clothing?: A Little Help from another person to put on and taking off regular lower body clothing?: A Lot 6 Click Score: 16    End of Session Equipment Utilized During Treatment: Rolling walker  OT Visit Diagnosis: Unsteadiness on feet (R26.81);Muscle weakness (generalized) (M62.81)   Activity Tolerance Patient tolerated treatment well   Patient Left in chair;with call bell/phone within reach   Nurse Communication Mobility status        Time: 1103-1594 OT Time Calculation (min): 27 min  Charges: OT General Charges $OT Visit: 1 Visit OT Treatments $Self Care/Home Management : 23-37 mins  Kathie Dike, M.S. OTR/L  11/18/20, 4:28 PM  ascom [phone]

## 2020-11-18 NOTE — Progress Notes (Signed)
PROGRESS NOTE    Christian [name]  YBW:[id number] DOB: 19-Jan-1976 DOA: 10/09/2020 PCP: Oneita Hurt, No     Brief Narrative:  Christian [name] is a 45 y.o. male with a history of morbid obesity, hypertension who presented with lower abdominal pain with evidence of abdominal wall cellulitis and also found to have lower extremity cellulitis.  He was treated with antibiotics with improvement.  During the hospitalization, he developed pneumoperitoneum with cecal perforation requiring surgical repair.  Patient was also found to be hypoxic and hypercapnic requiring BiPAP at night.  Hospitalization complicated by social issues as patient is homeless.   New events last 24 hours / Subjective: Admits that he has been urinating well on IV Lasix.  Denies any acute symptoms today.  He remains on 2 L nasal cannula O2 today. UOP yesterday.   Assessment & Plan:   Principal Problem:   Severe sepsis (HCC) Active Problems:   Morbid obesity (HCC)   Hyperglycemia   AKI (acute kidney injury) (HCC)   Pressure ulcer of back   Pressure ulcer of buttock   Essential hypertension   Cellulitis of abdominal wall   Impaired fasting glucose   Acute respiratory failure with hypoxia (HCC)   Weakness   Acute on chronic respiratory failure with hypoxia and hypercapnia (HCC)   Bowel perforation (HCC)   Hypotension   Obesity hypoventilation syndrome (HCC)   Pneumoperitoneum with cecal perforation -Underwent diagnostic laparoscopy converted to open laparotomy on 10/15/2020 with repair of bowel perforation.  Patient has completed IV antibiotic course.  General surgery following intermittently for wound care.  Currently stable.   Abdominal wall cellulitis/lower extremity cellulitis -Has completed course of IV and oral antibiotics along with antifungals.   Severe sepsis, present on admission -Resolved   Right pleural effusion -Status post thoracentesis on 9/19 with removal of 2 L of fluid   Acute on chronic  hypoxic and hypercapnic respiratory failure -Patient noted to have ABG with pH of 7.39 indicating compensated pH, elevated PCO2 of 69 and elevated serum bicarb of 41.  This would indicate Chronic hypercapnic respiratory failure and he would benefit with nocturnal positive pressure therapy. -I suspect his chronic respiratory failure is secondary to obesity hypoventilation/obstructive sleep apnea -Check echocardiogram to evaluate for cor pulmonale -Wean O2 to room air -Continue IV Lasix   AKI -Resolved   Weakness/generalized deconditioning -PT recommending SNF.  Social worker following.  Hypokalemia -Replace, trend     In agreement with assessment of the pressure ulcer as below:  Pressure Injury 10/09/20 Buttocks Right Unstageable - Full thickness tissue loss in which the base of the injury is covered by slough (yellow, tan, gray, green or brown) and/or eschar (tan, brown or black) in the wound bed. (Active)  10/09/20 1933  Location: Buttocks  Location Orientation: Right  Staging: Unstageable - Full thickness tissue loss in which the base of the injury is covered by slough (yellow, tan, gray, green or brown) and/or eschar (tan, brown or black) in the wound bed.  Wound Description (Comments):   Present on Admission: Yes     Pressure Injury 10/09/20 Heel Left Stage 2 -  Partial thickness loss of dermis presenting as a shallow open injury with a red, pink wound bed without slough. (Active)  10/09/20   Location: Heel  Location Orientation: Left  Staging: Stage 2 -  Partial thickness loss of dermis presenting as a shallow open injury with a red, pink wound bed without slough.  Wound Description (Comments):   Present on  Admission: Yes     Pressure Injury 10/09/20 Heel Right Unstageable - Full thickness tissue loss in which the base of the injury is covered by slough (yellow, tan, gray, green or brown) and/or eschar (tan, brown or black) in the wound bed. (Active)  10/09/20   Location:  Heel  Location Orientation: Right  Staging: Unstageable - Full thickness tissue loss in which the base of the injury is covered by slough (yellow, tan, gray, green or brown) and/or eschar (tan, brown or black) in the wound bed.  Wound Description (Comments):   Present on Admission: Yes     Pressure Injury 10/22/20 Thigh Posterior;Proximal;Right Unstageable - Full thickness tissue loss in which the base of the injury is covered by slough (yellow, tan, gray, green or brown) and/or eschar (tan, brown or black) in the wound bed. (Active)  10/22/20 1055  Location: Thigh  Location Orientation: Posterior;Proximal;Right  Staging: Unstageable - Full thickness tissue loss in which the base of the injury is covered by slough (yellow, tan, gray, green or brown) and/or eschar (tan, brown or black) in the wound bed.  Wound Description (Comments):   Present on Admission: Yes     Nutrition Problem: Increased nutrient needs Etiology: wound healing   DVT prophylaxis: Lovenox  Code Status: Full code Family Communication: None at bedside Disposition Plan:  Status is: Inpatient  Remains inpatient appropriate because:Unsafe d/c plan  Dispo: The patient is from:  Homeless              Anticipated d/c is to:  To be determined              Patient currently is not medically stable to d/c.  Remains on IV Lasix today   Difficult to place patient Yes    Antimicrobials:  Anti-infectives (From admission, onward)    Start     Dose/Rate Route Frequency Ordered Stop   10/17/20 1800  anidulafungin (ERAXIS) 100 mg in sodium chloride 0.9 % 100 mL IVPB  Status:  Discontinued       See Hyperspace for full Linked Orders Report.   100 mg 78 mL/hr over 100 Minutes Intravenous Every 24 hours 10/16/20 1706 10/18/20 1221   10/16/20 1800  anidulafungin (ERAXIS) 200 mg in sodium chloride 0.9 % 200 mL IVPB       See Hyperspace for full Linked Orders Report.   200 mg 78 mL/hr over 200 Minutes Intravenous  Once  10/16/20 1706 10/16/20 2141   10/15/20 2030  piperacillin-tazobactam (ZOSYN) IVPB 3.375 g        3.375 g 12.5 mL/hr over 240 Minutes Intravenous Every 8 hours 10/15/20 1925 10/19/20 2358   10/12/20 2200  cephALEXin (KEFLEX) capsule 1,000 mg  Status:  Discontinued        1,000 mg Oral Every 8 hours 10/12/20 1416 10/15/20 1917   10/12/20 1600  fluconazole (DIFLUCAN) tablet 200 mg        200 mg Oral Every 24 hours 10/12/20 1417 10/17/20 1559   10/11/20 1600  fluconazole (DIFLUCAN) tablet 400 mg  Status:  Discontinued        400 mg Oral Every 24 hours 10/10/20 1519 10/12/20 1417   10/10/20 1615  fluconazole (DIFLUCAN) tablet 800 mg        800 mg Oral  Once 10/10/20 1519 10/10/20 1641   10/10/20 1000  cefTRIAXone (ROCEPHIN) 2 g in sodium chloride 0.9 % 100 mL IVPB  Status:  Discontinued        2 g 200  mL/hr over 30 Minutes Intravenous Every 24 hours 10/09/20 1236 10/09/20 1408   10/10/20 0000  vancomycin (VANCOREADY) IVPB 1750 mg/350 mL  Status:  Discontinued        1,750 mg 175 mL/hr over 120 Minutes Intravenous Every 12 hours 10/09/20 1722 10/12/20 1415   10/09/20 1430  vancomycin (VANCOREADY) IVPB 1500 mg/300 mL  Status:  Discontinued        1,500 mg 150 mL/hr over 120 Minutes Intravenous  Once 10/09/20 1414 10/10/20 0120   10/09/20 1145  cefTRIAXone (ROCEPHIN) 1 g in sodium chloride 0.9 % 100 mL IVPB        1 g 200 mL/hr over 30 Minutes Intravenous  Once 10/09/20 1139 10/09/20 1356   10/09/20 1145  vancomycin (VANCOCIN) IVPB 1000 mg/200 mL premix        1,000 mg 200 mL/hr over 60 Minutes Intravenous  Once 10/09/20 1139 10/09/20 1656        Objective: Vitals:   11/17/20 1600 11/17/20 2110 11/18/20 0450 11/18/20 0752  BP:  119/65 126/79 111/69  Pulse:  (!) 104 91 80  Resp:  18 18 20   Temp:  97.9 F (36.6 C) 97.8 F (36.6 C) 98.5 F (36.9 C)  TempSrc:  Oral Oral Oral  SpO2: 93% 92% 98% 92%  Weight:      Height:        Intake/Output Summary (Last 24 hours) at 11/18/2020  1327 Last data filed at 11/18/2020 1052 Gross per 24 hour  Intake 720 ml  Output 1000 ml  Net -280 ml   Filed Weights   10/09/20 0807 10/15/20 1600  Weight: (!) 197.3 kg (!) 201.6 kg    Examination:  General exam: Appears calm and comfortable  Respiratory system: Clear to auscultation anteriorly, respiratory effort is normal without conversational dyspnea Cardiovascular system: S1 & S2 heard, RRR. No murmurs. + Nonpitting bilateral pedal edema. Gastrointestinal system: Abdomen is nondistended, soft and nontender. Normal bowel sounds heard. Central nervous system: Alert and oriented. No focal neurological deficits. Speech clear.  Extremities: Symmetric in appearance  Psychiatry: Stable  Data Reviewed: I have personally reviewed following labs and imaging studies  CBC: Recent Labs  Lab 11/15/20 0514 11/18/20 0517  WBC 9.8 9.6  HGB 12.0* 11.0*  HCT 37.9* 35.0*  MCV 84.2 81.4  PLT 290 315   Basic Metabolic Panel: Recent Labs  Lab 11/14/20 0517 11/15/20 0514 11/16/20 0514 11/17/20 0448 11/18/20 0517  NA 137 137 136 138 138  K 4.2 3.8 4.0 3.5 3.4*  CL 98 95* 95* 94* 94*  CO2 30 35* 34* 37* 36*  GLUCOSE 99 109* 104* 114* 109*  BUN 12 14 14 16 16   CREATININE 0.75 0.81 0.84 0.89 0.83  CALCIUM 8.4* 8.5* 8.4* 8.3* 8.5*   GFR: Estimated Creatinine Clearance: 206.5 mL/min (by C-G formula based on SCr of 0.83 mg/dL). Liver Function Tests: No results for input(s): AST, ALT, ALKPHOS, BILITOT, PROT, ALBUMIN in the last 168 hours. No results for input(s): LIPASE, AMYLASE in the last 168 hours. No results for input(s): AMMONIA in the last 168 hours. Coagulation Profile: No results for input(s): INR, PROTIME in the last 168 hours. Cardiac Enzymes: No results for input(s): CKTOTAL, CKMB, CKMBINDEX, TROPONINI in the last 168 hours. BNP (last 3 results) No results for input(s): PROBNP in the last 8760 hours. HbA1C: No results for input(s): HGBA1C in the last 72  hours. CBG: No results for input(s): GLUCAP in the last 168 hours. Lipid Profile: No results for input(s):  CHOL, HDL, LDLCALC, TRIG, CHOLHDL, LDLDIRECT in the last 72 hours. Thyroid Function Tests: No results for input(s): TSH, T4TOTAL, FREET4, T3FREE, THYROIDAB in the last 72 hours. Anemia Panel: No results for input(s): VITAMINB12, FOLATE, FERRITIN, TIBC, IRON, RETICCTPCT in the last 72 hours. Sepsis Labs: No results for input(s): PROCALCITON, LATICACIDVEN in the last 168 hours.  Recent Results (from the past 240 hour(s))  Body fluid culture w Gram Stain     Status: None (Preliminary result)   Collection Time: 11/16/20  3:51 PM   Specimen: PATH Cytology Pleural fluid  Result Value Ref Range Status   Specimen Description   Final    PLEURAL Performed at [facility], [address] [place], [place] [address]    Special Requests   Final    NONE Performed at [facility], [address] [place], [place] [address]    Gram Stain   Final    MODERATE WBC PRESENT, PREDOMINANTLY MONONUCLEAR NO ORGANISMS SEEN    Culture   Final    NO GROWTH 2 DAYS Performed at [facility], [address], [place], [place] [address]    Report Status PENDING  Incomplete      Radiology Studies: DG Chest 1 View  Result Date: 11/16/2020 CLINICAL DATA:  Status post thoracentesis EXAM: CHEST  1 VIEW COMPARISON:  Chest radiograph 11/16/2020 FINDINGS: The cardiomediastinal silhouette is stable, allowing for rightward patient rotation. The right pleural effusion has significantly decreased in size following thoracentesis. There is no evidence of pneumothorax. Aeration at the left lung is unchanged. There is no left pleural effusion or pneumothorax. The bones are stable. IMPRESSION: Significant interval decrease in size of the right pleural effusion following thoracentesis. No evidence of pneumothorax. Electronically Signed   By: Lesia Hausen M.D.   On: 11/16/2020 16:18   US  THORACENTESIS ASP PLEURAL SPACE W/IMG GUIDE  Result Date: 11/16/2020 INDICATION: Patient with a history of respiratory failure presents today with a right pleural effusion. Interventional radiology asked to perform a diagnostic and therapeutic thoracentesis. EXAM: ULTRASOUND GUIDED THORACENTESIS MEDICATIONS: 1% lidocaine 10 mL COMPLICATIONS: None immediate. PROCEDURE: An ultrasound guided thoracentesis was thoroughly discussed with the patient and questions answered. The benefits, risks, alternatives and complications were also discussed. The patient understands and wishes to proceed with the procedure. Written consent was obtained. Ultrasound was performed to localize and mark an adequate pocket of fluid in the right chest. The area was then prepped and draped in the normal sterile fashion. 1% Lidocaine was used for local anesthesia. Under ultrasound guidance a 6 Fr Safe-T-Centesis catheter was introduced. Thoracentesis was performed. The catheter was removed and a dressing applied. FINDINGS: A total of approximately 2.2 L of amber-colored fluid was removed. Samples were sent to the laboratory as requested by the clinical team. IMPRESSION: Successful ultrasound guided right thoracentesis yielding 2.2 L of pleural fluid. Read by: Alwyn Ren, NP Electronically Signed   By: Simonne Come M.D.   On: 11/16/2020 16:10      Scheduled Meds:  vitamin C  500 mg Oral BID   collagenase   Topical Daily   enoxaparin (LOVENOX) injection  100 mg Subcutaneous Q24H   feeding supplement  237 mL Oral TID BM   folic acid  1 mg Oral Daily   furosemide  40 mg Intravenous BID   hydrocerin   Topical BID   iron polysaccharides  150 mg Oral Daily   multivitamin with minerals  1 tablet Oral Daily   potassium chloride  20 mEq Oral BID   vitamin B-12  100 mcg Oral Daily   Vitamin D (Ergocalciferol)  50,000 Units Oral Q7 days   Continuous Infusions:   LOS: 39 days      Time spent: 25 minutes   Noralee Stain,  DO [facility] 11/18/2020, 1:27 PM   Available via Epic secure chat 7am-7pm After these hours, please refer to coverage provider listed on [email or web address]

## 2020-11-19 LAB — BASIC METABOLIC PANEL
Anion gap: 12 (ref 5–15)
BUN: 16 mg/dL (ref 6–20)
CO2: 34 mmol/L — ABNORMAL HIGH (ref 22–32)
Calcium: 8.5 mg/dL — ABNORMAL LOW (ref 8.9–10.3)
Chloride: 91 mmol/L — ABNORMAL LOW (ref 98–111)
Creatinine, Ser: 0.83 mg/dL (ref 0.61–1.24)
GFR, Estimated: >60 mL/min (ref >60)
Glucose, Bld: 110 mg/dL — ABNORMAL HIGH (ref 70–99)
Potassium: 3.2 mmol/L — ABNORMAL LOW (ref 3.5–5.1)
Sodium: 137 mmol/L (ref 135–145)

## 2020-11-19 MED ORDER — FUROSEMIDE 40 MG PO TABS
40.0000 mg | ORAL_TABLET | Freq: Two times a day (BID) | ORAL | Status: DC
  Administered 2020-11-19 – 2020-11-30 (×22): 40 mg via ORAL
  Filled 2020-11-19 (×23): qty 1

## 2020-11-19 MED ORDER — POTASSIUM CHLORIDE CRYS ER 20 MEQ PO TBCR
40.0000 meq | EXTENDED_RELEASE_TABLET | Freq: Once | ORAL | Status: AC
  Administered 2020-11-19: 40 meq via ORAL
  Filled 2020-11-19: qty 2

## 2020-11-19 NOTE — Progress Notes (Signed)
Mobility Specialist - Progress Note   11/19/20 1300  Mobility  Activity Ambulated in room;Transferred to/from [facility]  Level of Assistance Standby assist, set-up cues, supervision of patient - no hands on  Assistive Device Front wheel walker  Distance Ambulated (ft) 10 ft  Mobility Out of bed for toileting;Ambulated with assistance in room  Mobility Response Tolerated well  Mobility performed by Mobility specialist  $Mobility charge 1 Mobility    Pt ambulated in room to [facility] for BM. Call bell placed in reach to notify nursing staff when finished.    Filiberto Pinks Mobility Specialist 11/19/20, 1:33 PM

## 2020-11-19 NOTE — Progress Notes (Signed)
Physical Therapy Treatment Patient Details Name: Christian [name] MRN: [id number] DOB: 11/27/1975 Today's Date: 11/19/2020   History of Present Illness Pt is a 45 y/o M who presented to ED on 10/09/20 with c/c of bilateral lower abdominal pain & c/o emesis twice that day after eating. Pt found to have abdominal wall purulent cellulitis. Pt admitted with clinical sepsis & AKI. PMH: morbid obesity, HTN. Pt with increased SOB starting 8/18 and CXR ordered with concern for free air. CT showed pneumoperitoneum suspicious for bowel perforation. Pt now s/p diagnostic laparoscopy converted to exploratory laparotomy, primary repair of cecal perforation and t/f to ICU on 8/19.    PT Comments    Pt standing with RN upon arrival with supervision for dressing changes.  He transfers to recliner with supervision to allow for gait set up.  He is being weaned from O2 and on 0.5 LPM at rest but RN suggested increasing O2 for gait.  2 lpm for mobility.  He is able to walk 30' x 2 with bariatric RW and supervision with assist for O2 tank.  Sats 87%, 90% and 91% after gait trials.  With encouragement to inhale through nose and not mouth had a positive affect on sats.  Pt with no LOB but did have some increased fatigue on 3rd attempt. Returned to 0.5 LPM at rest.   Recommendations for follow up therapy are one component of a multi-disciplinary discharge planning process, led by the attending physician.  Recommendations may be updated based on patient status, additional functional criteria and insurance authorization.  Follow Up Recommendations  SNF;Supervision/Assistance - 24 hour;Supervision for mobility/OOB     Equipment Recommendations  Other (comment);3in1 (PT);Rolling walker with 5" wheels (will need bariatric walker)    Recommendations for Other Services       Precautions / Restrictions Precautions Precautions: Fall Precaution Comments: abdominal surgical incision Restrictions Weight Bearing  Restrictions: No     Mobility  Bed Mobility               General bed mobility comments: standing with RN upon arrival.  stood from chair x 2 with supervision but cues for hand placements    Transfers Overall transfer level: Needs assistance Equipment used: Rolling walker (2 wheeled) Transfers: Sit to/from Stand Sit to Stand: Supervision            Ambulation/Gait   Gait Distance (Feet): 40 Feet Assistive device: Rolling walker (2 wheeled) Gait Pattern/deviations: Wide base of support;Trunk flexed Gait velocity: decreased   General Gait Details: 40' x 3 with O2 at 2 LPM per nursing suggestion   Stairs             Wheelchair Mobility    Modified Rankin (Stroke Patients Only)       Balance Overall balance assessment: Needs assistance Sitting-balance support: Feet supported Sitting balance-[name]: Good     Standing balance support: During functional activity;Single extremity supported Standing balance-[name]: Fair Standing balance comment: Requires BUE support                            Cognition Arousal/Alertness: Awake/alert Behavior During Therapy: WFL for tasks assessed/performed Overall Cognitive Status: Within Functional Limits for tasks assessed                                        Exercises  General Comments        Pertinent Vitals/Pain Pain Assessment: No/denies pain    Home Living                      Prior Function            PT Goals (current goals can now be found in the care plan section) Progress towards PT goals: Progressing toward goals    Frequency    Min 2X/week      PT Plan Current plan remains appropriate    Co-evaluation              AM-PAC PT "6 Clicks" Mobility   Outcome Measure  Help needed turning from your back to your side while in a flat bed without using bedrails?: A Little Help needed moving from lying on your back to sitting on  the side of a flat bed without using bedrails?: A Little   Help needed standing up from a chair using your arms (e.g., wheelchair or bedside chair)?: A Little Help needed to walk in hospital room?: A Little Help needed climbing 3-5 steps with a railing? : A Lot 6 Click Score: 14    End of Session Equipment Utilized During Treatment: Gait belt;Oxygen Activity Tolerance: Patient tolerated treatment well Patient left: in chair;with call bell/phone within reach;with chair alarm set Nurse Communication: Mobility status PT Visit Diagnosis: Muscle weakness (generalized) (M62.81);Difficulty in walking, not elsewhere classified (R26.2)     Time: 8921-1941 PT Time Calculation (min) (ACUTE ONLY): 25 min  Charges:  $Gait Training: 23-37 mins                    Danielle Dess, PTA 11/19/20, 10:33 AM

## 2020-11-19 NOTE — Progress Notes (Signed)
PROGRESS NOTE    Christian [name]  ZOX:[id number] DOB: [birth year] DOA: 10/09/2020 PCP: Oneita Hurt, No     Brief Narrative:  Christian [name] is a 45 y.o. male with a history of morbid obesity, hypertension who presented with lower abdominal pain with evidence of abdominal wall cellulitis and also found to have lower extremity cellulitis.  He was treated with antibiotics with improvement.  During the hospitalization, he developed pneumoperitoneum with cecal perforation requiring surgical repair.  Patient was also found to be hypoxic and hypercapnic requiring BiPAP at night.  Hospitalization complicated by social issues as patient is homeless.   New events last 24 hours / Subjective: No worsening SOB at rest, remains on 0.5L O2 at rest. However, with minimal activity, his O2 sat drops down and requires O2 with ambulation. Feels his legs are less swollen.   Assessment & Plan:   Principal Problem:   Severe sepsis (HCC) Active Problems:   Morbid obesity (HCC)   Hyperglycemia   AKI (acute kidney injury) (HCC)   Pressure ulcer of back   Pressure ulcer of buttock   Essential hypertension   Cellulitis of abdominal wall   Impaired fasting glucose   Acute respiratory failure with hypoxia (HCC)   Weakness   Acute on chronic respiratory failure with hypoxia and hypercapnia (HCC)   Bowel perforation (HCC)   Hypotension   Obesity hypoventilation syndrome (HCC)   Pneumoperitoneum with cecal perforation -Underwent diagnostic laparoscopy converted to open laparotomy on 10/15/2020 with repair of bowel perforation.  Patient has completed IV antibiotic course.  General surgery following intermittently for wound care.  Currently stable.     Abdominal wall cellulitis/lower extremity cellulitis -Has completed course of IV and oral antibiotics along with antifungals.   Severe sepsis, present on admission -Resolved   Right pleural effusion -Status post thoracentesis on 9/19 with removal of 2 L of fluid    Acute on chronic hypoxic and hypercapnic respiratory failure secondary to acute diastolic heart failure  -Patient noted to have ABG with pH of 7.39 indicating compensated pH, elevated PCO2 of 69 and elevated serum bicarb of 41.  This would indicate Chronic hypercapnic respiratory failure and he would benefit with nocturnal positive pressure therapy. -I suspect his chronic respiratory failure is secondary to obesity hypoventilation/obstructive sleep apnea -Echo with EF 55%  -Wean O2 to room air -Change lasix to PO today    AKI -Resolved   Weakness/generalized deconditioning -PT recommending SNF.  Social worker following.  Hypokalemia -Replace, trend     In agreement with assessment of the pressure ulcer as below:  Pressure Injury 10/09/20 Buttocks Right Unstageable - Full thickness tissue loss in which the base of the injury is covered by slough (yellow, tan, gray, green or brown) and/or eschar (tan, brown or black) in the wound bed. (Active)  10/09/20 1933  Location: Buttocks  Location Orientation: Right  Staging: Unstageable - Full thickness tissue loss in which the base of the injury is covered by slough (yellow, tan, gray, green or brown) and/or eschar (tan, brown or black) in the wound bed.  Wound Description (Comments):   Present on Admission: Yes     Pressure Injury 10/09/20 Heel Left Stage 2 -  Partial thickness loss of dermis presenting as a shallow open injury with a red, pink wound bed without slough. (Active)  10/09/20   Location: Heel  Location Orientation: Left  Staging: Stage 2 -  Partial thickness loss of dermis presenting as a shallow open injury with a red, pink  wound bed without slough.  Wound Description (Comments):   Present on Admission: Yes     Pressure Injury 10/09/20 Heel Right Unstageable - Full thickness tissue loss in which the base of the injury is covered by slough (yellow, tan, gray, green or brown) and/or eschar (tan, brown or black) in the wound  bed. (Active)  10/09/20   Location: Heel  Location Orientation: Right  Staging: Unstageable - Full thickness tissue loss in which the base of the injury is covered by slough (yellow, tan, gray, green or brown) and/or eschar (tan, brown or black) in the wound bed.  Wound Description (Comments):   Present on Admission: Yes     Pressure Injury 10/22/20 Thigh Posterior;Proximal;Right Unstageable - Full thickness tissue loss in which the base of the injury is covered by slough (yellow, tan, gray, green or brown) and/or eschar (tan, brown or black) in the wound bed. (Active)  10/22/20 1055  Location: Thigh  Location Orientation: Posterior;Proximal;Right  Staging: Unstageable - Full thickness tissue loss in which the base of the injury is covered by slough (yellow, tan, gray, green or brown) and/or eschar (tan, brown or black) in the wound bed.  Wound Description (Comments):   Present on Admission: Yes     Nutrition Problem: Increased nutrient needs Etiology: wound healing   DVT prophylaxis: Lovenox  Code Status: Full code Family Communication: None at bedside Disposition Plan:  Status is: Inpatient  Remains inpatient appropriate because:Unsafe d/c plan  Dispo: The patient is from:  Homeless              Anticipated d/c is to:  To be determined              Patient currently is medically stable to d/c.  Awaiting safe dispo plan, need to coordinate hotel stay, payment for BiPAP etc prior to discharge.    Difficult to place patient Yes    Antimicrobials:  Anti-infectives (From admission, onward)    Start     Dose/Rate Route Frequency Ordered Stop   10/17/20 1800  anidulafungin (ERAXIS) 100 mg in sodium chloride 0.9 % 100 mL IVPB  Status:  Discontinued       See Hyperspace for full Linked Orders Report.   100 mg 78 mL/hr over 100 Minutes Intravenous Every 24 hours 10/16/20 1706 10/18/20 1221   10/16/20 1800  anidulafungin (ERAXIS) 200 mg in sodium chloride 0.9 % 200 mL IVPB        See Hyperspace for full Linked Orders Report.   200 mg 78 mL/hr over 200 Minutes Intravenous  Once 10/16/20 1706 10/16/20 2141   10/15/20 2030  piperacillin-tazobactam (ZOSYN) IVPB 3.375 g        3.375 g 12.5 mL/hr over 240 Minutes Intravenous Every 8 hours 10/15/20 1925 10/19/20 2358   10/12/20 2200  cephALEXin (KEFLEX) capsule 1,000 mg  Status:  Discontinued        1,000 mg Oral Every 8 hours 10/12/20 1416 10/15/20 1917   10/12/20 1600  fluconazole (DIFLUCAN) tablet 200 mg        200 mg Oral Every 24 hours 10/12/20 1417 10/17/20 1559   10/11/20 1600  fluconazole (DIFLUCAN) tablet 400 mg  Status:  Discontinued        400 mg Oral Every 24 hours 10/10/20 1519 10/12/20 1417   10/10/20 1615  fluconazole (DIFLUCAN) tablet 800 mg        800 mg Oral  Once 10/10/20 1519 10/10/20 1641   10/10/20 1000  cefTRIAXone (ROCEPHIN) 2  g in sodium chloride 0.9 % 100 mL IVPB  Status:  Discontinued        2 g 200 mL/hr over 30 Minutes Intravenous Every 24 hours 10/09/20 1236 10/09/20 1408   10/10/20 0000  vancomycin (VANCOREADY) IVPB 1750 mg/350 mL  Status:  Discontinued        1,750 mg 175 mL/hr over 120 Minutes Intravenous Every 12 hours 10/09/20 1722 10/12/20 1415   10/09/20 1430  vancomycin (VANCOREADY) IVPB 1500 mg/300 mL  Status:  Discontinued        1,500 mg 150 mL/hr over 120 Minutes Intravenous  Once 10/09/20 1414 10/10/20 0120   10/09/20 1145  cefTRIAXone (ROCEPHIN) 1 g in sodium chloride 0.9 % 100 mL IVPB        1 g 200 mL/hr over 30 Minutes Intravenous  Once 10/09/20 1139 10/09/20 1356   10/09/20 1145  vancomycin (VANCOCIN) IVPB 1000 mg/200 mL premix        1,000 mg 200 mL/hr over 60 Minutes Intravenous  Once 10/09/20 1139 10/09/20 1656        Objective: Vitals:   11/18/20 0752 11/18/20 1609 11/18/20 1943 11/19/20 0444  BP: 111/69 113/71 120/70 110/75  Pulse: 80 (!) 101 96 81  Resp: 20 20 18 18   Temp: 98.5 F (36.9 C) 99 F (37.2 C) 99.4 F (37.4 C) 97.7 F (36.5 C)  TempSrc:  Oral Oral Oral Oral  SpO2: 92% 92% 90% 98%  Weight:      Height:        Intake/Output Summary (Last 24 hours) at 11/19/2020 1133 Last data filed at 11/19/2020 1048 Gross per 24 hour  Intake 1200 ml  Output 250 ml  Net 950 ml    Filed Weights   10/09/20 0807 10/15/20 1600  Weight: (!) 197.3 kg (!) 201.6 kg    Examination: General exam: Appears calm and comfortable  Respiratory system: Difficult to assess due to body habitus, diminished breath sounds. Respiratory effort normal. Cardiovascular system: S1 & S2 heard, RRR. Nonpitting pedal edema. Gastrointestinal system: Abdomen is nondistended, soft and nontender.  Central nervous system: Alert and oriented. Non focal exam. Speech clear  Extremities: Symmetric in appearance bilaterally  Psychiatry: Stable    Data Reviewed: I have personally reviewed following labs and imaging studies  CBC: Recent Labs  Lab 11/15/20 0514 11/18/20 0517  WBC 9.8 9.6  HGB 12.0* 11.0*  HCT 37.9* 35.0*  MCV 84.2 81.4  PLT 290 315    Basic Metabolic Panel: Recent Labs  Lab 11/15/20 0514 11/16/20 0514 11/17/20 0448 11/18/20 0517 11/19/20 0358  NA 137 136 138 138 137  K 3.8 4.0 3.5 3.4* 3.2*  CL 95* 95* 94* 94* 91*  CO2 35* 34* 37* 36* 34*  GLUCOSE 109* 104* 114* 109* 110*  BUN 14 14 16 16 16   CREATININE 0.81 0.84 0.89 0.83 0.83  CALCIUM 8.5* 8.4* 8.3* 8.5* 8.5*    GFR: Estimated Creatinine Clearance: 206.5 mL/min (by C-G formula based on SCr of 0.83 mg/dL). Liver Function Tests: No results for input(s): AST, ALT, ALKPHOS, BILITOT, PROT, ALBUMIN in the last 168 hours. No results for input(s): LIPASE, AMYLASE in the last 168 hours. No results for input(s): AMMONIA in the last 168 hours. Coagulation Profile: No results for input(s): INR, PROTIME in the last 168 hours. Cardiac Enzymes: No results for input(s): CKTOTAL, CKMB, CKMBINDEX, TROPONINI in the last 168 hours. BNP (last 3 results) No results for input(s): PROBNP in the  last 8760 hours. HbA1C: No results  for input(s): HGBA1C in the last 72 hours. CBG: No results for input(s): GLUCAP in the last 168 hours. Lipid Profile: No results for input(s): CHOL, HDL, LDLCALC, TRIG, CHOLHDL, LDLDIRECT in the last 72 hours. Thyroid Function Tests: No results for input(s): TSH, T4TOTAL, FREET4, T3FREE, THYROIDAB in the last 72 hours. Anemia Panel: No results for input(s): VITAMINB12, FOLATE, FERRITIN, TIBC, IRON, RETICCTPCT in the last 72 hours. Sepsis Labs: No results for input(s): PROCALCITON, LATICACIDVEN in the last 168 hours.  Recent Results (from the past 240 hour(s))  Body fluid culture w Gram Stain     Status: None (Preliminary result)   Collection Time: 11/16/20  3:51 PM   Specimen: PATH Cytology Pleural fluid  Result Value Ref Range Status   Specimen Description   Final    PLEURAL Performed at [facility], [address], [place], [place] [address]    Special Requests   Final    NONE Performed at [facility], [address] [place], [place] [address]    Gram Stain   Final    MODERATE WBC PRESENT, PREDOMINANTLY MONONUCLEAR NO ORGANISMS SEEN    Culture   Final    NO GROWTH 3 DAYS Performed at [facility], [address], [place], [place] [address]    Report Status PENDING  Incomplete       Radiology Studies: ECHOCARDIOGRAM COMPLETE  Result Date: 11/18/2020    ECHOCARDIOGRAM REPORT   Patient Name:   Christian [name] Lucia Date of Exam: 11/18/2020 Medical Rec #:  [id number]         Height:       75.0 in Accession #:    [id number]        Weight:       444.4 lb Date of Birth:  Mar 29, 1975         BSA:          3.081 m Patient Age:    45 years          BP:           111/69 mmHg Patient Gender: M                 HR:           80 bpm. Exam Location:  [facility] Procedure: 2D Echo, Cardiac Doppler and Color Doppler Indications:     Pulmonary hypertension I27.2  History:         Patient has no prior history of Echocardiogram examinations.                   Signs/Symptoms:Chest Pain; Risk Factors:Hypertension.  Sonographer:     Cristela Blue Referring Phys:  [id number] [facility] Diagnosing Phys: Debbe Odea MD  Sonographer Comments: No apical window, no subcostal window and Technically difficult study due to poor echo windows. IMPRESSIONS  1. Left ventricular ejection fraction, by estimation, is 55 to 60%. The left ventricle has normal function. Left ventricular endocardial border not optimally defined to evaluate regional wall motion. There is mild left ventricular hypertrophy. Left ventricular diastolic function could not be evaluated.  2. Right ventricular systolic function is normal. The right ventricular size is not well visualized.  3. The mitral valve is grossly normal. No evidence of mitral valve regurgitation.  4. The aortic valve was not well visualized. Aortic valve regurgitation is not visualized. FINDINGS  Left Ventricle: Left ventricular ejection fraction, by estimation, is 55 to 60%. The left ventricle has normal function. Left ventricular endocardial  border not optimally defined to evaluate regional wall motion. The left ventricular internal cavity size was normal in size. There is mild left ventricular hypertrophy. Left ventricular diastolic function could not be evaluated. Right Ventricle: The right ventricular size is not well visualized. No increase in right ventricular wall thickness. Right ventricular systolic function is normal. Left Atrium: Left atrial size was not well visualized. Right Atrium: Right atrial size was not well visualized. Pericardium: Trivial pericardial effusion is present. Mitral Valve: The mitral valve is grossly normal. No evidence of mitral valve regurgitation. Tricuspid Valve: The tricuspid valve is not well visualized. Tricuspid valve regurgitation is not demonstrated. Aortic Valve: The aortic valve was not well visualized. Aortic valve regurgitation is not visualized. Pulmonic Valve: The pulmonic valve  was not well visualized. Pulmonic valve regurgitation is not visualized. Aorta: The aortic root was not well visualized. Venous: The inferior vena cava was not well visualized. IAS/Shunts: The interatrial septum was not well visualized.  LEFT VENTRICLE PLAX 2D LVIDd:         4.50 cm LVIDs:         3.20 cm LV PW:         1.40 cm LV IVS:        1.80 cm LVOT diam:     2.10 cm LVOT Area:     3.46 cm  LEFT ATRIUM         Index LA diam:    5.00 cm 1.62 cm/m   AORTA Ao Root diam: 3.40 cm  SHUNTS Systemic Diam: 2.10 cm Debbe Odea MD Electronically signed by Debbe Odea MD Signature Date/Time: 11/18/2020/3:43:21 PM    Final       Scheduled Meds:  vitamin C  500 mg Oral BID   collagenase   Topical Daily   enoxaparin (LOVENOX) injection  100 mg Subcutaneous Q24H   feeding supplement  237 mL Oral TID BM   folic acid  1 mg Oral Daily   furosemide  40 mg Oral BID   hydrocerin   Topical BID   iron polysaccharides  150 mg Oral Daily   multivitamin with minerals  1 tablet Oral Daily   potassium chloride  20 mEq Oral BID   potassium chloride  40 mEq Oral Once   vitamin B-12  100 mcg Oral Daily   Vitamin D (Ergocalciferol)  50,000 Units Oral Q7 days   Continuous Infusions:   LOS: 40 days      Time spent: 25 minutes   Noralee Stain, DO [facility] 11/19/2020, 11:33 AM   Available via Epic secure chat 7am-7pm After these hours, please refer to coverage provider listed on [email or web address]

## 2020-11-19 NOTE — Progress Notes (Signed)
IV team consulted for IV start after patient worked with PT and mobility specialist to move into the chair. IV consult RN unwilling to assess or attempt IV placement unless patient is in bed. Writer will complete max 2 attempts and find someone willing to assess + attempt at this time.

## 2020-11-19 NOTE — TOC Progression Note (Signed)
Transition of Care [facility]) - Progression Note    Patient Details  Name: Christian [name] MRN: [id number] Date of Birth: 1976-01-05  Transition of Care [facility]) CM/SW Contact  Maree Krabbe, LCSW Phone Number: 11/19/2020, 3:51 PM  Clinical Narrative:  CSW spoke with Helmut Muster Disability Director at [facility] and she will email CSW the referral form to fill out and email back to her. They could possible assist in applying pt for Medicaid and Disability.       Barriers to Discharge: Continued Medical Work up  Expected Discharge Plan and Services         Living arrangements for the past 2 months: Homeless                                       Social Determinants of Health (SDOH) Interventions    Readmission Risk Interventions Readmission Risk Prevention Plan 10/11/2020  Post Dischage Appt Complete  Medication Screening Complete  Transportation Screening Complete  Some recent data might be hidden

## 2020-11-20 LAB — BODY FLUID CULTURE W GRAM STAIN: Culture: NO GROWTH

## 2020-11-20 LAB — BASIC METABOLIC PANEL
Anion gap: 9 (ref 5–15)
BUN: 16 mg/dL (ref 6–20)
CO2: 34 mmol/L — ABNORMAL HIGH (ref 22–32)
Calcium: 8.4 mg/dL — ABNORMAL LOW (ref 8.9–10.3)
Chloride: 93 mmol/L — ABNORMAL LOW (ref 98–111)
Creatinine, Ser: 0.8 mg/dL (ref 0.61–1.24)
GFR, Estimated: >60 mL/min (ref >60)
Glucose, Bld: 110 mg/dL — ABNORMAL HIGH (ref 70–99)
Potassium: 3.5 mmol/L (ref 3.5–5.1)
Sodium: 136 mmol/L (ref 135–145)

## 2020-11-20 NOTE — Progress Notes (Signed)
Mobility Specialist - Progress Note   11/20/20 1100  Mobility  Activity Ambulated in hall  Level of Assistance Standby assist, set-up cues, supervision of patient - no hands on  Assistive Device Front wheel walker  Distance Ambulated (ft) 200 ft  Mobility Ambulated with assistance in hallway  Mobility Response Tolerated well  Mobility performed by Mobility specialist  $Mobility charge 1 Mobility    Pre-mobility: 96 HR, 93% SpO2 During mobility: 120 HR, 93% SpO2 Post-mobility: 112 HR, 95% SpO2   Pt utilizing .5L upon arrival. Bumped to 1L for ambulation, O2 desat to 83% after ~15'. Seated rest break taken and O2 increased to 2L for remainder of session. Continued ambulation with 2 standing rest breaks and 1 seated rest break d/t fatigue in LE and SOB. Max HR 120 bpm. Pt returned to [facility] upon entering room, call bell in reach. of urine discarded.    Filiberto Pinks Mobility Specialist 11/20/20, 11:12 AM

## 2020-11-20 NOTE — Progress Notes (Signed)
   11/20/20 2258  Assess: MEWS Score  Pulse Rate (!) 102  SpO2 96 %  Assess: MEWS Score  MEWS Temp 0  MEWS Systolic 0  MEWS Pulse 1  MEWS RR 1  MEWS LOC 0  MEWS Score 2  MEWS Score Color Yellow  Assess: if the MEWS score is Yellow or Red  Were vital signs taken at a resting state? No  Focused Assessment No change from prior assessment  Does the patient meet 2 or more of the SIRS criteria? No  Does the patient have a confirmed or suspected source of infection? No  Provider and Rapid Response Notified? No  MEWS guidelines implemented *See Row Information* No, other (Comment) (Patient will be rechecked in an hour just transferred to the bed)  Treat  MEWS Interventions Other (Comment) (Patient just transferred to the bed.  Will retake in an hour)  Pain Scale 0-10  Pain Score 0  Notify: Charge Nurse/RN  Name of Charge Nurse/RN Notified Tamara, RN  Date Charge Nurse/RN Notified 11/19/20  Time Charge Nurse/RN Notified 2325  Assess: SIRS CRITERIA  SIRS Temperature  0  SIRS Pulse 1  SIRS Respirations  1  SIRS WBC 0  SIRS Score Sum  2

## 2020-11-20 NOTE — Progress Notes (Signed)
PROGRESS NOTE    Breylan Lefevers  VZD:[id number] DOB: 1975/08/04 DOA: 10/09/2020 PCP: Oneita Hurt, No     Brief Narrative:  Lothar Prehn is a 45 y.o. male with a history of morbid obesity, hypertension who presented with lower abdominal pain with evidence of abdominal wall cellulitis and also found to have lower extremity cellulitis.  He was treated with antibiotics with improvement.  During the hospitalization, he developed pneumoperitoneum with cecal perforation requiring surgical repair.  Patient was also found to be hypoxic and hypercapnic requiring BiPAP at night.  Hospitalization complicated by social issues as patient is homeless.   New events last 24 hours / Subjective: Feels that he has some phlegm in his throat.  No other new complaints.  Assessment & Plan:   Principal Problem:   Severe sepsis (HCC) Active Problems:   Morbid obesity (HCC)   Hyperglycemia   AKI (acute kidney injury) (HCC)   Pressure ulcer of back   Pressure ulcer of buttock   Essential hypertension   Cellulitis of abdominal wall   Impaired fasting glucose   Acute respiratory failure with hypoxia (HCC)   Weakness   Acute on chronic respiratory failure with hypoxia and hypercapnia (HCC)   Bowel perforation (HCC)   Hypotension   Obesity hypoventilation syndrome (HCC)   Pneumoperitoneum with cecal perforation -Underwent diagnostic laparoscopy converted to open laparotomy on 10/15/2020 with repair of bowel perforation.  Patient has completed IV antibiotic course.  General surgery following intermittently for wound care.  Currently stable.     Abdominal wall cellulitis/lower extremity cellulitis -Has completed course of IV and oral antibiotics along with antifungals.   Severe sepsis, present on admission -Resolved   Right pleural effusion -Status post thoracentesis on 9/19 with removal of 2 L of fluid   Acute on chronic hypoxic and hypercapnic respiratory failure secondary to acute diastolic heart  failure  -Patient noted to have ABG with pH of 7.39 indicating compensated pH, elevated PCO2 of 69 and elevated serum bicarb of 41.  This would indicate Chronic hypercapnic respiratory failure and he would benefit with nocturnal positive pressure therapy. -I suspect his chronic respiratory failure is secondary to obesity hypoventilation/obstructive sleep apnea -Echo with EF 55%  -Wean O2 to room air -Continue p.o. Lasix   AKI -Resolved   Weakness/generalized deconditioning -PT recommending SNF.  Social worker following.  Hypokalemia -Replace, trend     In agreement with assessment of the pressure ulcer as below:  Pressure Injury 10/09/20 Buttocks Right Unstageable - Full thickness tissue loss in which the base of the injury is covered by slough (yellow, tan, gray, green or brown) and/or eschar (tan, brown or black) in the wound bed. (Active)  10/09/20 1933  Location: Buttocks  Location Orientation: Right  Staging: Unstageable - Full thickness tissue loss in which the base of the injury is covered by slough (yellow, tan, gray, green or brown) and/or eschar (tan, brown or black) in the wound bed.  Wound Description (Comments):   Present on Admission: Yes     Pressure Injury 10/09/20 Heel Left Stage 2 -  Partial thickness loss of dermis presenting as a shallow open injury with a red, pink wound bed without slough. (Active)  10/09/20   Location: Heel  Location Orientation: Left  Staging: Stage 2 -  Partial thickness loss of dermis presenting as a shallow open injury with a red, pink wound bed without slough.  Wound Description (Comments):   Present on Admission: Yes     Pressure Injury 10/09/20  Heel Right Unstageable - Full thickness tissue loss in which the base of the injury is covered by slough (yellow, tan, gray, green or brown) and/or eschar (tan, brown or black) in the wound bed. (Active)  10/09/20   Location: Heel  Location Orientation: Right  Staging: Unstageable - Full  thickness tissue loss in which the base of the injury is covered by slough (yellow, tan, gray, green or brown) and/or eschar (tan, brown or black) in the wound bed.  Wound Description (Comments):   Present on Admission: Yes     Pressure Injury 10/22/20 Thigh Posterior;Proximal;Right Unstageable - Full thickness tissue loss in which the base of the injury is covered by slough (yellow, tan, gray, green or brown) and/or eschar (tan, brown or black) in the wound bed. (Active)  10/22/20 1055  Location: Thigh  Location Orientation: Posterior;Proximal;Right  Staging: Unstageable - Full thickness tissue loss in which the base of the injury is covered by slough (yellow, tan, gray, green or brown) and/or eschar (tan, brown or black) in the wound bed.  Wound Description (Comments):   Present on Admission: Yes     Nutrition Problem: Increased nutrient needs Etiology: wound healing   DVT prophylaxis: Lovenox  Code Status: Full code Family Communication: None at bedside Disposition Plan:  Status is: Inpatient  Remains inpatient appropriate because:Unsafe d/c plan  Dispo: The patient is from:  Homeless              Anticipated d/c is to:  To be determined              Patient currently is medically stable to d/c.  Awaiting safe dispo plan, need to coordinate hotel stay, payment for BiPAP etc prior to discharge.    Difficult to place patient Yes    Antimicrobials:  Anti-infectives (From admission, onward)    Start     Dose/Rate Route Frequency Ordered Stop   10/17/20 1800  anidulafungin (ERAXIS) 100 mg in sodium chloride 0.9 % 100 mL IVPB  Status:  Discontinued       See Hyperspace for full Linked Orders Report.   100 mg 78 mL/hr over 100 Minutes Intravenous Every 24 hours 10/16/20 1706 10/18/20 1221   10/16/20 1800  anidulafungin (ERAXIS) 200 mg in sodium chloride 0.9 % 200 mL IVPB       See Hyperspace for full Linked Orders Report.   200 mg 78 mL/hr over 200 Minutes Intravenous  Once  10/16/20 1706 10/16/20 2141   10/15/20 2030  piperacillin-tazobactam (ZOSYN) IVPB 3.375 g        3.375 g 12.5 mL/hr over 240 Minutes Intravenous Every 8 hours 10/15/20 1925 10/19/20 2358   10/12/20 2200  cephALEXin (KEFLEX) capsule 1,000 mg  Status:  Discontinued        1,000 mg Oral Every 8 hours 10/12/20 1416 10/15/20 1917   10/12/20 1600  fluconazole (DIFLUCAN) tablet 200 mg        200 mg Oral Every 24 hours 10/12/20 1417 10/17/20 1559   10/11/20 1600  fluconazole (DIFLUCAN) tablet 400 mg  Status:  Discontinued        400 mg Oral Every 24 hours 10/10/20 1519 10/12/20 1417   10/10/20 1615  fluconazole (DIFLUCAN) tablet 800 mg        800 mg Oral  Once 10/10/20 1519 10/10/20 1641   10/10/20 1000  cefTRIAXone (ROCEPHIN) 2 g in sodium chloride 0.9 % 100 mL IVPB  Status:  Discontinued        2  g 200 mL/hr over 30 Minutes Intravenous Every 24 hours 10/09/20 1236 10/09/20 1408   10/10/20 0000  vancomycin (VANCOREADY) IVPB 1750 mg/350 mL  Status:  Discontinued        1,750 mg 175 mL/hr over 120 Minutes Intravenous Every 12 hours 10/09/20 1722 10/12/20 1415   10/09/20 1430  vancomycin (VANCOREADY) IVPB 1500 mg/300 mL  Status:  Discontinued        1,500 mg 150 mL/hr over 120 Minutes Intravenous  Once 10/09/20 1414 10/10/20 0120   10/09/20 1145  cefTRIAXone (ROCEPHIN) 1 g in sodium chloride 0.9 % 100 mL IVPB        1 g 200 mL/hr over 30 Minutes Intravenous  Once 10/09/20 1139 10/09/20 1356   10/09/20 1145  vancomycin (VANCOCIN) IVPB 1000 mg/200 mL premix        1,000 mg 200 mL/hr over 60 Minutes Intravenous  Once 10/09/20 1139 10/09/20 1656        Objective: Vitals:   11/19/20 1951 11/20/20 0417 11/20/20 0500 11/20/20 0922  BP: 116/65 124/64  116/75  Pulse: 91 82  90  Resp: 18 20    Temp: 98.4 F (36.9 C) 98.9 F (37.2 C)  98 F (36.7 C)  TempSrc: Oral Oral  Oral  SpO2: 94% 100%  93%  Weight:   88.5 kg   Height:        Intake/Output Summary (Last 24 hours) at 11/20/2020  1029 Last data filed at 11/20/2020 0415 Gross per 24 hour  Intake 720 ml  Output 475 ml  Net 245 ml    Filed Weights   10/15/20 1600 11/19/20 1418 11/20/20 0500  Weight: (!) 201.6 kg (!) 209.6 kg 88.5 kg    Examination: General exam: Appears calm and comfortable  Respiratory system: Clear to auscultation anteriorly without rhonchi or wheeze.  Respiratory effort is normal, no conversational dyspnea Cardiovascular system: S1 & S2 heard, RRR. No pedal edema. Gastrointestinal system: Abdomen is nondistended, soft and nontender. Normal bowel sounds heard. Central nervous system: Alert and oriented. Non focal exam. Speech clear  Extremities: Symmetric in appearance bilaterally  Psychiatry: Stable    Data Reviewed: I have personally reviewed following labs and imaging studies  CBC: Recent Labs  Lab 11/15/20 0514 11/18/20 0517  WBC 9.8 9.6  HGB 12.0* 11.0*  HCT 37.9* 35.0*  MCV 84.2 81.4  PLT 290 315    Basic Metabolic Panel: Recent Labs  Lab 11/16/20 0514 11/17/20 0448 11/18/20 0517 11/19/20 0358 11/20/20 0544  NA 136 138 138 137 136  K 4.0 3.5 3.4* 3.2* 3.5  CL 95* 94* 94* 91* 93*  CO2 34* 37* 36* 34* 34*  GLUCOSE 104* 114* 109* 110* 110*  BUN 14 16 16 16 16   CREATININE 0.84 0.89 0.83 0.83 0.80  CALCIUM 8.4* 8.3* 8.5* 8.5* 8.4*    GFR: Estimated Creatinine Clearance: 137.9 mL/min (by C-G formula based on SCr of 0.8 mg/dL). Liver Function Tests: No results for input(s): AST, ALT, ALKPHOS, BILITOT, PROT, ALBUMIN in the last 168 hours. No results for input(s): LIPASE, AMYLASE in the last 168 hours. No results for input(s): AMMONIA in the last 168 hours. Coagulation Profile: No results for input(s): INR, PROTIME in the last 168 hours. Cardiac Enzymes: No results for input(s): CKTOTAL, CKMB, CKMBINDEX, TROPONINI in the last 168 hours. BNP (last 3 results) No results for input(s): PROBNP in the last 8760 hours. HbA1C: No results for input(s): HGBA1C in the last  72 hours. CBG: No results for input(s): GLUCAP  in the last 168 hours. Lipid Profile: No results for input(s): CHOL, HDL, LDLCALC, TRIG, CHOLHDL, LDLDIRECT in the last 72 hours. Thyroid Function Tests: No results for input(s): TSH, T4TOTAL, FREET4, T3FREE, THYROIDAB in the last 72 hours. Anemia Panel: No results for input(s): VITAMINB12, FOLATE, FERRITIN, TIBC, IRON, RETICCTPCT in the last 72 hours. Sepsis Labs: No results for input(s): PROCALCITON, LATICACIDVEN in the last 168 hours.  Recent Results (from the past 240 hour(s))  Body fluid culture w Gram Stain     Status: None   Collection Time: 11/16/20  3:51 PM   Specimen: PATH Cytology Pleural fluid  Result Value Ref Range Status   Specimen Description   Final    PLEURAL Performed at [facility], [address] [place], [place] [address]    Special Requests   Final    NONE Performed at [facility], [address] [place], [place] [address]    Gram Stain   Final    MODERATE WBC PRESENT, PREDOMINANTLY MONONUCLEAR NO ORGANISMS SEEN    Culture   Final    NO GROWTH Performed at [facility], [address] [place], [place] [address]    Report Status 11/20/2020 FINAL  Final       Radiology Studies: ECHOCARDIOGRAM COMPLETE  Result Date: 11/18/2020    ECHOCARDIOGRAM REPORT   Patient Name:   KNUTE MAZZUCA Lien Date of Exam: 11/18/2020 Medical Rec #:  [id number]         Height:       75.0 in Accession #:    [id number]        Weight:       444.4 lb Date of Birth:  09-11-1975         BSA:          3.081 m Patient Age:    45 years          BP:           111/69 mmHg Patient Gender: M                 HR:           80 bpm. Exam Location:  [facility] Procedure: 2D Echo, Cardiac Doppler and Color Doppler Indications:     Pulmonary hypertension I27.2  History:         Patient has no prior history of Echocardiogram examinations.                  Signs/Symptoms:Chest Pain; Risk Factors:Hypertension.  Sonographer:     Cristela Blue Referring Phys:  [id number] [facility] Diagnosing Phys: Debbe Odea MD  Sonographer Comments: No apical window, no subcostal window and Technically difficult study due to poor echo windows. IMPRESSIONS  1. Left ventricular ejection fraction, by estimation, is 55 to 60%. The left ventricle has normal function. Left ventricular endocardial border not optimally defined to evaluate regional wall motion. There is mild left ventricular hypertrophy. Left ventricular diastolic function could not be evaluated.  2. Right ventricular systolic function is normal. The right ventricular size is not well visualized.  3. The mitral valve is grossly normal. No evidence of mitral valve regurgitation.  4. The aortic valve was not well visualized. Aortic valve regurgitation is not visualized. FINDINGS  Left Ventricle: Left ventricular ejection fraction, by estimation, is 55 to 60%. The left ventricle has normal function. Left ventricular endocardial border not optimally defined to evaluate regional wall motion. The left ventricular internal cavity size was normal  in size. There is mild left ventricular hypertrophy. Left ventricular diastolic function could not be evaluated. Right Ventricle: The right ventricular size is not well visualized. No increase in right ventricular wall thickness. Right ventricular systolic function is normal. Left Atrium: Left atrial size was not well visualized. Right Atrium: Right atrial size was not well visualized. Pericardium: Trivial pericardial effusion is present. Mitral Valve: The mitral valve is grossly normal. No evidence of mitral valve regurgitation. Tricuspid Valve: The tricuspid valve is not well visualized. Tricuspid valve regurgitation is not demonstrated. Aortic Valve: The aortic valve was not well visualized. Aortic valve regurgitation is not visualized. Pulmonic Valve: The pulmonic valve was not well visualized. Pulmonic valve regurgitation is not visualized. Aorta: The aortic root  was not well visualized. Venous: The inferior vena cava was not well visualized. IAS/Shunts: The interatrial septum was not well visualized.  LEFT VENTRICLE PLAX 2D LVIDd:         4.50 cm LVIDs:         3.20 cm LV PW:         1.40 cm LV IVS:        1.80 cm LVOT diam:     2.10 cm LVOT Area:     3.46 cm  LEFT ATRIUM         Index LA diam:    5.00 cm 1.62 cm/m   AORTA Ao Root diam: 3.40 cm  SHUNTS Systemic Diam: 2.10 cm Debbe Odea MD Electronically signed by Debbe Odea MD Signature Date/Time: 11/18/2020/3:43:21 PM    Final       Scheduled Meds:  vitamin C  500 mg Oral BID   collagenase   Topical Daily   enoxaparin (LOVENOX) injection  100 mg Subcutaneous Q24H   feeding supplement  237 mL Oral TID BM   folic acid  1 mg Oral Daily   furosemide  40 mg Oral BID   hydrocerin   Topical BID   iron polysaccharides  150 mg Oral Daily   multivitamin with minerals  1 tablet Oral Daily   potassium chloride  20 mEq Oral BID   vitamin B-12  100 mcg Oral Daily   Vitamin D (Ergocalciferol)  50,000 Units Oral Q7 days   Continuous Infusions:   LOS: 41 days      Time spent: 20 minutes   Noralee Stain, DO [facility] 11/20/2020, 10:29 AM   Available via Epic secure chat 7am-7pm After these hours, please refer to coverage provider listed on [email or web address]

## 2020-11-20 NOTE — Progress Notes (Signed)
Dressing changes done to mid-abdominal incision site and right posterior thigh wound. Pt tolerated with no problems.  VWilliams,RN.

## 2020-11-21 LAB — BASIC METABOLIC PANEL
Anion gap: 10 (ref 5–15)
BUN: 16 mg/dL (ref 6–20)
CO2: 32 mmol/L (ref 22–32)
Calcium: 8.3 mg/dL — ABNORMAL LOW (ref 8.9–10.3)
Chloride: 95 mmol/L — ABNORMAL LOW (ref 98–111)
Creatinine, Ser: 0.81 mg/dL (ref 0.61–1.24)
GFR, Estimated: >60 mL/min (ref >60)
Glucose, Bld: 111 mg/dL — ABNORMAL HIGH (ref 70–99)
Potassium: 3.6 mmol/L (ref 3.5–5.1)
Sodium: 137 mmol/L (ref 135–145)

## 2020-11-21 MED ORDER — SERTRALINE HCL 50 MG PO TABS
25.0000 mg | ORAL_TABLET | Freq: Every day | ORAL | Status: DC
  Administered 2020-11-21 – 2020-11-28 (×8): 25 mg via ORAL
  Filled 2020-11-21 (×9): qty 1

## 2020-11-21 NOTE — Plan of Care (Signed)

## 2020-11-21 NOTE — Progress Notes (Signed)
PROGRESS NOTE    Christian [name]  BWI:[id number] DOB: 1975-10-11 DOA: 10/09/2020 PCP: Oneita Hurt, No     Brief Narrative:  Christian [name] is a 45 y.o. male with a history of morbid obesity, hypertension who presented with lower abdominal pain with evidence of abdominal wall cellulitis and also found to have lower extremity cellulitis.  He was treated with antibiotics with improvement.  During the hospitalization, he developed pneumoperitoneum with cecal perforation requiring surgical repair.  Patient was also found to be hypoxic and hypercapnic requiring BiPAP at night.  Hospitalization complicated by social issues as patient is homeless.   New events last 24 hours / Subjective: Admits to depressed mood which has waxed and waned throughout his life.  He states that he was on Prozac as an adolescent, which was changed to another medication at some point.  Has not been on an antidepressant lately.  Denies any suicidal or self-harm ideation.  He is requesting to start on antidepressant, anxiolytic during hospital stay.  Assessment & Plan:   Principal Problem:   Severe sepsis (HCC) Active Problems:   Morbid obesity (HCC)   Hyperglycemia   AKI (acute kidney injury) (HCC)   Pressure ulcer of back   Pressure ulcer of buttock   Essential hypertension   Cellulitis of abdominal wall   Impaired fasting glucose   Acute respiratory failure with hypoxia (HCC)   Weakness   Acute on chronic respiratory failure with hypoxia and hypercapnia (HCC)   Bowel perforation (HCC)   Hypotension   Obesity hypoventilation syndrome (HCC)   Pneumoperitoneum with cecal perforation -Underwent diagnostic laparoscopy converted to open laparotomy on 10/15/2020 with repair of bowel perforation.  Patient has completed IV antibiotic course.  General surgery following intermittently for wound care.  Currently stable.     Abdominal wall cellulitis/lower extremity cellulitis -Has completed course of IV and oral antibiotics  along with antifungals.   Severe sepsis, present on admission -Resolved   Right pleural effusion -Status post thoracentesis on 9/19 with removal of 2 L of fluid   Acute on chronic hypoxic and hypercapnic respiratory failure secondary to acute diastolic heart failure  -Patient noted to have ABG with pH of 7.39 indicating compensated pH, elevated PCO2 of 69 and elevated serum bicarb of 41.  This would indicate Chronic hypercapnic respiratory failure and he would benefit with nocturnal positive pressure therapy. -I suspect his chronic respiratory failure is secondary to obesity hypoventilation/obstructive sleep apnea -Echo with EF 55%  -Wean O2 to room air as able -Continue p.o. Lasix   AKI -Resolved   Weakness/generalized deconditioning -PT recommending SNF.  Social worker following.  Depression/anxiety -Start Zoloft today     In agreement with assessment of the pressure ulcer as below:  Pressure Injury 10/09/20 Buttocks Right Unstageable - Full thickness tissue loss in which the base of the injury is covered by slough (yellow, tan, gray, green or brown) and/or eschar (tan, brown or black) in the wound bed. (Active)  10/09/20 1933  Location: Buttocks  Location Orientation: Right  Staging: Unstageable - Full thickness tissue loss in which the base of the injury is covered by slough (yellow, tan, gray, green or brown) and/or eschar (tan, brown or black) in the wound bed.  Wound Description (Comments):   Present on Admission: Yes     Pressure Injury 10/09/20 Heel Left Stage 2 -  Partial thickness loss of dermis presenting as a shallow open injury with a red, pink wound bed without slough. (Active)  10/09/20  Location: Heel  Location Orientation: Left  Staging: Stage 2 -  Partial thickness loss of dermis presenting as a shallow open injury with a red, pink wound bed without slough.  Wound Description (Comments):   Present on Admission: Yes     Pressure Injury 10/09/20 Heel Right  Unstageable - Full thickness tissue loss in which the base of the injury is covered by slough (yellow, tan, gray, green or brown) and/or eschar (tan, brown or black) in the wound bed. (Active)  10/09/20   Location: Heel  Location Orientation: Right  Staging: Unstageable - Full thickness tissue loss in which the base of the injury is covered by slough (yellow, tan, gray, green or brown) and/or eschar (tan, brown or black) in the wound bed.  Wound Description (Comments):   Present on Admission: Yes     Pressure Injury 10/22/20 Thigh Posterior;Proximal;Right Unstageable - Full thickness tissue loss in which the base of the injury is covered by slough (yellow, tan, gray, green or brown) and/or eschar (tan, brown or black) in the wound bed. (Active)  10/22/20 1055  Location: Thigh  Location Orientation: Posterior;Proximal;Right  Staging: Unstageable - Full thickness tissue loss in which the base of the injury is covered by slough (yellow, tan, gray, green or brown) and/or eschar (tan, brown or black) in the wound bed.  Wound Description (Comments):   Present on Admission: Yes     Nutrition Problem: Increased nutrient needs Etiology: wound healing   DVT prophylaxis: Lovenox  Code Status: Full code Family Communication: None at bedside Disposition Plan:  Status is: Inpatient  Remains inpatient appropriate because:Unsafe d/c plan  Dispo: The patient is from:  Homeless              Anticipated d/c is to:  To be determined              Patient currently is medically stable to d/c.  Awaiting safe dispo plan, need to coordinate hotel stay, payment for BiPAP etc prior to discharge.    Difficult to place patient Yes    Antimicrobials:  Anti-infectives (From admission, onward)    Start     Dose/Rate Route Frequency Ordered Stop   10/17/20 1800  anidulafungin (ERAXIS) 100 mg in sodium chloride 0.9 % 100 mL IVPB  Status:  Discontinued       See Hyperspace for full Linked Orders Report.    100 mg 78 mL/hr over 100 Minutes Intravenous Every 24 hours 10/16/20 1706 10/18/20 1221   10/16/20 1800  anidulafungin (ERAXIS) 200 mg in sodium chloride 0.9 % 200 mL IVPB       See Hyperspace for full Linked Orders Report.   200 mg 78 mL/hr over 200 Minutes Intravenous  Once 10/16/20 1706 10/16/20 2141   10/15/20 2030  piperacillin-tazobactam (ZOSYN) IVPB 3.375 g        3.375 g 12.5 mL/hr over 240 Minutes Intravenous Every 8 hours 10/15/20 1925 10/19/20 2358   10/12/20 2200  cephALEXin (KEFLEX) capsule 1,000 mg  Status:  Discontinued        1,000 mg Oral Every 8 hours 10/12/20 1416 10/15/20 1917   10/12/20 1600  fluconazole (DIFLUCAN) tablet 200 mg        200 mg Oral Every 24 hours 10/12/20 1417 10/17/20 1559   10/11/20 1600  fluconazole (DIFLUCAN) tablet 400 mg  Status:  Discontinued        400 mg Oral Every 24 hours 10/10/20 1519 10/12/20 1417   10/10/20 1615  fluconazole (DIFLUCAN)  tablet 800 mg        800 mg Oral  Once 10/10/20 1519 10/10/20 1641   10/10/20 1000  cefTRIAXone (ROCEPHIN) 2 g in sodium chloride 0.9 % 100 mL IVPB  Status:  Discontinued        2 g 200 mL/hr over 30 Minutes Intravenous Every 24 hours 10/09/20 1236 10/09/20 1408   10/10/20 0000  vancomycin (VANCOREADY) IVPB 1750 mg/350 mL  Status:  Discontinued        1,750 mg 175 mL/hr over 120 Minutes Intravenous Every 12 hours 10/09/20 1722 10/12/20 1415   10/09/20 1430  vancomycin (VANCOREADY) IVPB 1500 mg/300 mL  Status:  Discontinued        1,500 mg 150 mL/hr over 120 Minutes Intravenous  Once 10/09/20 1414 10/10/20 0120   10/09/20 1145  cefTRIAXone (ROCEPHIN) 1 g in sodium chloride 0.9 % 100 mL IVPB        1 g 200 mL/hr over 30 Minutes Intravenous  Once 10/09/20 1139 10/09/20 1356   10/09/20 1145  vancomycin (VANCOCIN) IVPB 1000 mg/200 mL premix        1,000 mg 200 mL/hr over 60 Minutes Intravenous  Once 10/09/20 1139 10/09/20 1656        Objective: Vitals:   11/20/20 2258 11/20/20 2350 11/21/20 0404  11/21/20 0726  BP:  114/66 (!) 95/59 118/75  Pulse: (!) 102 61 85 85  Resp:  20 20 20   Temp:   98 F (36.7 C) (!) 97.5 F (36.4 C)  TempSrc:   Oral Oral  SpO2: 96% 96% 100% 98%  Weight:      Height:        Intake/Output Summary (Last 24 hours) at 11/21/2020 1047 Last data filed at 11/21/2020 0900 Gross per 24 hour  Intake 1440 ml  Output 775 ml  Net 665 ml    Filed Weights   10/15/20 1600 11/19/20 1418 11/20/20 0500  Weight: (!) 201.6 kg (!) 209.6 kg 88.5 kg    Examination: General exam: Appears calm and comfortable  Respiratory system: Diminished breath sounds bilaterally, difficult to auscultate due to body habitus.  Respiratory effort is normal without conversational dyspnea.  Remains on room air during my examination as his nasal cannula O2 had fallen off Cardiovascular system: S1 & S2 heard, RRR. No pedal edema. Gastrointestinal system: Abdomen is nondistended, soft and nontender. Normal bowel sounds heard. Central nervous system: Alert and oriented. Non focal exam. Speech clear  Extremities: Symmetric in appearance bilaterally  Psychiatry: Judgement and insight appear stable. Mood & affect appropriate.    Data Reviewed: I have personally reviewed following labs and imaging studies  CBC: Recent Labs  Lab 11/15/20 0514 11/18/20 0517  WBC 9.8 9.6  HGB 12.0* 11.0*  HCT 37.9* 35.0*  MCV 84.2 81.4  PLT 290 315    Basic Metabolic Panel: Recent Labs  Lab 11/17/20 0448 11/18/20 0517 11/19/20 0358 11/20/20 0544 11/21/20 0515  NA 138 138 137 136 137  K 3.5 3.4* 3.2* 3.5 3.6  CL 94* 94* 91* 93* 95*  CO2 37* 36* 34* 34* 32  GLUCOSE 114* 109* 110* 110* 111*  BUN 16 16 16 16 16   CREATININE 0.89 0.83 0.83 0.80 0.81  CALCIUM 8.3* 8.5* 8.5* 8.4* 8.3*    GFR: Estimated Creatinine Clearance: 136.2 mL/min (by C-G formula based on SCr of 0.81 mg/dL). Liver Function Tests: No results for input(s): AST, ALT, ALKPHOS, BILITOT, PROT, ALBUMIN in the last 168 hours. No  results for input(s): LIPASE, AMYLASE  in the last 168 hours. No results for input(s): AMMONIA in the last 168 hours. Coagulation Profile: No results for input(s): INR, PROTIME in the last 168 hours. Cardiac Enzymes: No results for input(s): CKTOTAL, CKMB, CKMBINDEX, TROPONINI in the last 168 hours. BNP (last 3 results) No results for input(s): PROBNP in the last 8760 hours. HbA1C: No results for input(s): HGBA1C in the last 72 hours. CBG: No results for input(s): GLUCAP in the last 168 hours. Lipid Profile: No results for input(s): CHOL, HDL, LDLCALC, TRIG, CHOLHDL, LDLDIRECT in the last 72 hours. Thyroid Function Tests: No results for input(s): TSH, T4TOTAL, FREET4, T3FREE, THYROIDAB in the last 72 hours. Anemia Panel: No results for input(s): VITAMINB12, FOLATE, FERRITIN, TIBC, IRON, RETICCTPCT in the last 72 hours. Sepsis Labs: No results for input(s): PROCALCITON, LATICACIDVEN in the last 168 hours.  Recent Results (from the past 240 hour(s))  Body fluid culture w Gram Stain     Status: None   Collection Time: 11/16/20  3:51 PM   Specimen: PATH Cytology Pleural fluid  Result Value Ref Range Status   Specimen Description   Final    PLEURAL Performed at [facility], [address], [place], [place] [address]    Special Requests   Final    NONE Performed at [facility], [address] [place], [place] [address]    Gram Stain   Final    MODERATE WBC PRESENT, PREDOMINANTLY MONONUCLEAR NO ORGANISMS SEEN    Culture   Final    NO GROWTH Performed at [facility], [address], [place], [place] [address]    Report Status 11/20/2020 FINAL  Final       Radiology Studies: No results found.    Scheduled Meds:  vitamin C  500 mg Oral BID   collagenase   Topical Daily   enoxaparin (LOVENOX) injection  100 mg Subcutaneous Q24H   feeding supplement  237 mL Oral TID BM   folic acid  1 mg Oral Daily   furosemide  40 mg Oral BID   hydrocerin    Topical BID   iron polysaccharides  150 mg Oral Daily   multivitamin with minerals  1 tablet Oral Daily   potassium chloride  20 mEq Oral BID   sertraline  25 mg Oral Daily   vitamin B-12  100 mcg Oral Daily   Vitamin D (Ergocalciferol)  50,000 Units Oral Q7 days   Continuous Infusions:   LOS: 42 days      Time spent: 20 minutes   Noralee Stain, DO [facility] 11/21/2020, 10:47 AM   Available via Epic secure chat 7am-7pm After these hours, please refer to coverage provider listed on [email or web address]

## 2020-11-21 NOTE — Progress Notes (Signed)
Dressing change done to abdomen incision site and right posterior thigh wound. Pt tolerated with no problem.

## 2020-11-22 NOTE — Progress Notes (Signed)
PROGRESS NOTE    Sherrod Toothman  ZOX:[id number] DOB: 31-Aug-1975 DOA: 10/09/2020 PCP: Oneita Hurt, No     Brief Narrative:  Christian [name] is a 45 y.o. male with a history of morbid obesity, hypertension who presented with lower abdominal pain with evidence of abdominal wall cellulitis and also found to have lower extremity cellulitis.  He was treated with antibiotics with improvement.  During the hospitalization, he developed pneumoperitoneum with cecal perforation requiring surgical repair.  Patient was also found to be hypoxic and hypercapnic requiring BiPAP at night.  Hospitalization complicated by social issues as patient is homeless.   New events last 24 hours / Subjective: Asks if there is a Veterinary surgeon on staff who he could speak with regarding his stressors and mood. I offered chaplain services which he declined for now.   Assessment & Plan:   Principal Problem:   Severe sepsis (HCC) Active Problems:   Morbid obesity (HCC)   Hyperglycemia   AKI (acute kidney injury) (HCC)   Pressure ulcer of back   Pressure ulcer of buttock   Essential hypertension   Cellulitis of abdominal wall   Impaired fasting glucose   Acute respiratory failure with hypoxia (HCC)   Weakness   Acute on chronic respiratory failure with hypoxia and hypercapnia (HCC)   Bowel perforation (HCC)   Hypotension   Obesity hypoventilation syndrome (HCC)   Pneumoperitoneum with cecal perforation -Underwent diagnostic laparoscopy converted to open laparotomy on 10/15/2020 with repair of bowel perforation.  Patient has completed IV antibiotic course.  General surgery following intermittently for wound care.  Currently stable.     Abdominal wall cellulitis/lower extremity cellulitis -Has completed course of IV and oral antibiotics along with antifungals.   Severe sepsis, present on admission -Resolved   Right pleural effusion -Status post thoracentesis on 9/19 with removal of 2 L of fluid   Acute on chronic  hypoxic and hypercapnic respiratory failure secondary to acute diastolic heart failure  -Patient noted to have ABG with pH of 7.39 indicating compensated pH, elevated PCO2 of 69 and elevated serum bicarb of 41.  This would indicate Chronic hypercapnic respiratory failure and he would benefit with nocturnal positive pressure therapy. -I suspect his chronic respiratory failure is secondary to obesity hypoventilation/obstructive sleep apnea -Echo with EF 55%  -Wean O2 to room air as able -Continue p.o. Lasix   AKI -Resolved   Weakness/generalized deconditioning -PT recommending SNF.  Social worker following.  Depression/anxiety -Started Zoloft 9/24 -No emergent issues/suicidal ideation to warrant inpatient psych consult for now     In agreement with assessment of the pressure ulcer as below:  Pressure Injury 10/09/20 Buttocks Right Unstageable - Full thickness tissue loss in which the base of the injury is covered by slough (yellow, tan, gray, green or brown) and/or eschar (tan, brown or black) in the wound bed. (Active)  10/09/20 1933  Location: Buttocks  Location Orientation: Right  Staging: Unstageable - Full thickness tissue loss in which the base of the injury is covered by slough (yellow, tan, gray, green or brown) and/or eschar (tan, brown or black) in the wound bed.  Wound Description (Comments):   Present on Admission: Yes     Pressure Injury 10/09/20 Heel Left Stage 2 -  Partial thickness loss of dermis presenting as a shallow open injury with a red, pink wound bed without slough. (Active)  10/09/20   Location: Heel  Location Orientation: Left  Staging: Stage 2 -  Partial thickness loss of dermis presenting as a  shallow open injury with a red, pink wound bed without slough.  Wound Description (Comments):   Present on Admission: Yes     Pressure Injury 10/09/20 Heel Right Unstageable - Full thickness tissue loss in which the base of the injury is covered by slough (yellow,  tan, gray, green or brown) and/or eschar (tan, brown or black) in the wound bed. (Active)  10/09/20   Location: Heel  Location Orientation: Right  Staging: Unstageable - Full thickness tissue loss in which the base of the injury is covered by slough (yellow, tan, gray, green or brown) and/or eschar (tan, brown or black) in the wound bed.  Wound Description (Comments):   Present on Admission: Yes     Pressure Injury 10/22/20 Thigh Posterior;Proximal;Right Unstageable - Full thickness tissue loss in which the base of the injury is covered by slough (yellow, tan, gray, green or brown) and/or eschar (tan, brown or black) in the wound bed. (Active)  10/22/20 1055  Location: Thigh  Location Orientation: Posterior;Proximal;Right  Staging: Unstageable - Full thickness tissue loss in which the base of the injury is covered by slough (yellow, tan, gray, green or brown) and/or eschar (tan, brown or black) in the wound bed.  Wound Description (Comments):   Present on Admission: Yes     Nutrition Problem: Increased nutrient needs Etiology: wound healing   DVT prophylaxis: Lovenox  Code Status: Full code Family Communication: None at bedside Disposition Plan:  Status is: Inpatient  Remains inpatient appropriate because:Unsafe d/c plan  Dispo: The patient is from:  Homeless              Anticipated d/c is to:  To be determined              Patient currently is medically stable to d/c.  Awaiting safe dispo plan, need to coordinate hotel stay, payment for BiPAP etc prior to discharge.    Difficult to place patient Yes    Antimicrobials:  Anti-infectives (From admission, onward)    Start     Dose/Rate Route Frequency Ordered Stop   10/17/20 1800  anidulafungin (ERAXIS) 100 mg in sodium chloride 0.9 % 100 mL IVPB  Status:  Discontinued       See Hyperspace for full Linked Orders Report.   100 mg 78 mL/hr over 100 Minutes Intravenous Every 24 hours 10/16/20 1706 10/18/20 1221   10/16/20 1800   anidulafungin (ERAXIS) 200 mg in sodium chloride 0.9 % 200 mL IVPB       See Hyperspace for full Linked Orders Report.   200 mg 78 mL/hr over 200 Minutes Intravenous  Once 10/16/20 1706 10/16/20 2141   10/15/20 2030  piperacillin-tazobactam (ZOSYN) IVPB 3.375 g        3.375 g 12.5 mL/hr over 240 Minutes Intravenous Every 8 hours 10/15/20 1925 10/19/20 2358   10/12/20 2200  cephALEXin (KEFLEX) capsule 1,000 mg  Status:  Discontinued        1,000 mg Oral Every 8 hours 10/12/20 1416 10/15/20 1917   10/12/20 1600  fluconazole (DIFLUCAN) tablet 200 mg        200 mg Oral Every 24 hours 10/12/20 1417 10/17/20 1559   10/11/20 1600  fluconazole (DIFLUCAN) tablet 400 mg  Status:  Discontinued        400 mg Oral Every 24 hours 10/10/20 1519 10/12/20 1417   10/10/20 1615  fluconazole (DIFLUCAN) tablet 800 mg        800 mg Oral  Once 10/10/20 1519 10/10/20 1641  10/10/20 1000  cefTRIAXone (ROCEPHIN) 2 g in sodium chloride 0.9 % 100 mL IVPB  Status:  Discontinued        2 g 200 mL/hr over 30 Minutes Intravenous Every 24 hours 10/09/20 1236 10/09/20 1408   10/10/20 0000  vancomycin (VANCOREADY) IVPB 1750 mg/350 mL  Status:  Discontinued        1,750 mg 175 mL/hr over 120 Minutes Intravenous Every 12 hours 10/09/20 1722 10/12/20 1415   10/09/20 1430  vancomycin (VANCOREADY) IVPB 1500 mg/300 mL  Status:  Discontinued        1,500 mg 150 mL/hr over 120 Minutes Intravenous  Once 10/09/20 1414 10/10/20 0120   10/09/20 1145  cefTRIAXone (ROCEPHIN) 1 g in sodium chloride 0.9 % 100 mL IVPB        1 g 200 mL/hr over 30 Minutes Intravenous  Once 10/09/20 1139 10/09/20 1356   10/09/20 1145  vancomycin (VANCOCIN) IVPB 1000 mg/200 mL premix        1,000 mg 200 mL/hr over 60 Minutes Intravenous  Once 10/09/20 1139 10/09/20 1656        Objective: Vitals:   11/21/20 1706 11/21/20 2026 11/22/20 0423 11/22/20 0723  BP: (!) 125/50 128/77 136/82 109/65  Pulse: 91 88 82 78  Resp: 12 18 18 20   Temp: 98.8 F  (37.1 C) 98.7 F (37.1 C) 98.1 F (36.7 C) 99 F (37.2 C)  TempSrc: Oral  Axillary Oral  SpO2: 92%  96% 99%  Weight:      Height:        Intake/Output Summary (Last 24 hours) at 11/22/2020 1055 Last data filed at 11/22/2020 0900 Gross per 24 hour  Intake 720 ml  Output 850 ml  Net -130 ml    Filed Weights   11/19/20 1418 11/20/20 0500 11/21/20 0900  Weight: (!) 209.6 kg 88.5 kg (!) 204 kg   Examination: General exam: Appears calm and comfortable  Respiratory system: Clear to auscultation anteriorly. Respiratory effort normal. On NCO2 Cardiovascular system: S1 & S2 heard, RRR. No pedal edema. Gastrointestinal system: Abdomen is nondistended, soft and nontender. Normal bowel sounds heard. Central nervous system: Alert and oriented. Non focal exam. Speech clear  Extremities: Symmetric in appearance bilaterally  Skin: Abdominal wound dressing clean and dry  Psychiatry: Judgement and insight appear stable. Mood & affect appropriate.     Data Reviewed: I have personally reviewed following labs and imaging studies  CBC: Recent Labs  Lab 11/18/20 0517  WBC 9.6  HGB 11.0*  HCT 35.0*  MCV 81.4  PLT 315    Basic Metabolic Panel: Recent Labs  Lab 11/17/20 0448 11/18/20 0517 11/19/20 0358 11/20/20 0544 11/21/20 0515  NA 138 138 137 136 137  K 3.5 3.4* 3.2* 3.5 3.6  CL 94* 94* 91* 93* 95*  CO2 37* 36* 34* 34* 32  GLUCOSE 114* 109* 110* 110* 111*  BUN 16 16 16 16 16   CREATININE 0.89 0.83 0.83 0.80 0.81  CALCIUM 8.3* 8.5* 8.5* 8.4* 8.3*    GFR: Estimated Creatinine Clearance: 213.2 mL/min (by C-G formula based on SCr of 0.81 mg/dL). Liver Function Tests: No results for input(s): AST, ALT, ALKPHOS, BILITOT, PROT, ALBUMIN in the last 168 hours. No results for input(s): LIPASE, AMYLASE in the last 168 hours. No results for input(s): AMMONIA in the last 168 hours. Coagulation Profile: No results for input(s): INR, PROTIME in the last 168 hours. Cardiac Enzymes: No  results for input(s): CKTOTAL, CKMB, CKMBINDEX, TROPONINI in the last 168  hours. BNP (last 3 results) No results for input(s): PROBNP in the last 8760 hours. HbA1C: No results for input(s): HGBA1C in the last 72 hours. CBG: No results for input(s): GLUCAP in the last 168 hours. Lipid Profile: No results for input(s): CHOL, HDL, LDLCALC, TRIG, CHOLHDL, LDLDIRECT in the last 72 hours. Thyroid Function Tests: No results for input(s): TSH, T4TOTAL, FREET4, T3FREE, THYROIDAB in the last 72 hours. Anemia Panel: No results for input(s): VITAMINB12, FOLATE, FERRITIN, TIBC, IRON, RETICCTPCT in the last 72 hours. Sepsis Labs: No results for input(s): PROCALCITON, LATICACIDVEN in the last 168 hours.  Recent Results (from the past 240 hour(s))  Body fluid culture w Gram Stain     Status: None   Collection Time: 11/16/20  3:51 PM   Specimen: PATH Cytology Pleural fluid  Result Value Ref Range Status   Specimen Description   Final    PLEURAL Performed at [facility], [address], [place], [place] [address]    Special Requests   Final    NONE Performed at [facility], [address] [place], [place] [address]    Gram Stain   Final    MODERATE WBC PRESENT, PREDOMINANTLY MONONUCLEAR NO ORGANISMS SEEN    Culture   Final    NO GROWTH Performed at [facility], [address] [place], [place] [address]    Report Status 11/20/2020 FINAL  Final       Radiology Studies: No results found.    Scheduled Meds:  vitamin C  500 mg Oral BID   collagenase   Topical Daily   enoxaparin (LOVENOX) injection  100 mg Subcutaneous Q24H   feeding supplement  237 mL Oral TID BM   folic acid  1 mg Oral Daily   furosemide  40 mg Oral BID   hydrocerin   Topical BID   iron polysaccharides  150 mg Oral Daily   multivitamin with minerals  1 tablet Oral Daily   potassium chloride  20 mEq Oral BID   sertraline  25 mg Oral Daily   vitamin B-12  100 mcg Oral Daily   Vitamin  D (Ergocalciferol)  50,000 Units Oral Q7 days   Continuous Infusions:   LOS: 43 days      Time spent: 20 minutes   Noralee Stain, DO [facility] 11/22/2020, 10:55 AM   Available via Epic secure chat 7am-7pm After these hours, please refer to coverage provider listed on [email or web address]

## 2020-11-23 LAB — BASIC METABOLIC PANEL
Anion gap: 6 (ref 5–15)
BUN: 18 mg/dL (ref 6–20)
CO2: 32 mmol/L (ref 22–32)
Calcium: 8.4 mg/dL — ABNORMAL LOW (ref 8.9–10.3)
Chloride: 100 mmol/L (ref 98–111)
Creatinine, Ser: 0.85 mg/dL (ref 0.61–1.24)
GFR, Estimated: >60 mL/min (ref >60)
Glucose, Bld: 103 mg/dL — ABNORMAL HIGH (ref 70–99)
Potassium: 3.9 mmol/L (ref 3.5–5.1)
Sodium: 138 mmol/L (ref 135–145)

## 2020-11-23 NOTE — Progress Notes (Signed)
Physical Therapy Treatment Patient Details Name: Christian [name] MRN: [id number] DOB: 1975-12-17 Today's Date: 11/23/2020   History of Present Illness Pt is a 45 y/o M who presented to ED on 10/09/20 with c/c of bilateral lower abdominal pain & c/o emesis twice that day after eating. Pt found to have abdominal wall purulent cellulitis. Pt admitted with clinical sepsis & AKI. PMH: morbid obesity, HTN. Pt with increased SOB starting 8/18 and CXR ordered with concern for free air. CT showed pneumoperitoneum suspicious for bowel perforation. Pt now s/p diagnostic laparoscopy converted to exploratory laparotomy, primary repair of cecal perforation and t/f to ICU on 8/19.    PT Comments    Pt in chair, ready for gait.  Stood and is able to walk 70' then after an extended seated rest 110' back to room.  Commode but no results.  Pt on 0.5 LPM at rest.  Increased to 2 lpm for gait per prior nurse recommendation.  Sats stayed 93% with increased O2 support today.  Will no increase O2 next session to check tolerance.   Recommendations for follow up therapy are one component of a multi-disciplinary discharge planning process, led by the attending physician.  Recommendations may be updated based on patient status, additional functional criteria and insurance authorization.  Follow Up Recommendations  SNF;Supervision/Assistance - 24 hour;Supervision for mobility/OOB     Equipment Recommendations  Other (comment);3in1 (PT);Rolling walker with 5" wheels (bariatric)    Recommendations for Other Services       Precautions / Restrictions Precautions Precautions: Fall Precaution Comments: abdominal surgical incision Restrictions Weight Bearing Restrictions: No     Mobility  Bed Mobility               General bed mobility comments: in chair upon arrival    Transfers Overall transfer level: Needs assistance Equipment used: Rolling walker (2 wheeled) Transfers: Sit to/from Stand Sit to  Stand: Supervision            Ambulation/Gait Ambulation/Gait assistance: Supervision Gait Distance (Feet): 110 Feet Assistive device: Rolling walker (2 wheeled) Gait Pattern/deviations: Wide base of support;Trunk flexed Gait velocity: decreased   General Gait Details: 90 then 110' wit hseated rest.   Stairs             Wheelchair Mobility    Modified Rankin (Stroke Patients Only)       Balance Overall balance assessment: Needs assistance Sitting-balance support: Feet supported Sitting balance-[name]: Good     Standing balance support: During functional activity;Single extremity supported Standing balance-[name]: Fair Standing balance comment: Requires BUE support                            Cognition Arousal/Alertness: Awake/alert Behavior During Therapy: WFL for tasks assessed/performed Overall Cognitive Status: Within Functional Limits for tasks assessed                                        Exercises      General Comments        Pertinent Vitals/Pain Pain Assessment: No/denies pain    Home Living                      Prior Function            PT Goals (current goals can now be found in the care plan section)  Progress towards PT goals: Progressing toward goals    Frequency    Min 2X/week      PT Plan Current plan remains appropriate    Co-evaluation              AM-PAC PT "6 Clicks" Mobility   Outcome Measure  Help needed turning from your back to your side while in a flat bed without using bedrails?: A Little Help needed moving from lying on your back to sitting on the side of a flat bed without using bedrails?: A Little Help needed moving to and from a bed to a chair (including a wheelchair)?: A Little Help needed standing up from a chair using your arms (e.g., wheelchair or bedside chair)?: A Little Help needed to walk in hospital room?: A Little Help needed climbing 3-5  steps with a railing? : A Lot 6 Click Score: 17    End of Session Equipment Utilized During Treatment: Oxygen Activity Tolerance: Patient tolerated treatment well Patient left: in chair;with call bell/phone within reach;with chair alarm set Nurse Communication: Mobility status PT Visit Diagnosis: Muscle weakness (generalized) (M62.81);Difficulty in walking, not elsewhere classified (R26.2)     Time: 8299-3716 PT Time Calculation (min) (ACUTE ONLY): 23 min  Charges:  $Gait Training: 8-22 mins $Therapeutic Activity: 8-22 mins                     Danielle Dess, PTA 11/23/20, 11:48 AM

## 2020-11-23 NOTE — Progress Notes (Signed)
Occupational Therapy Treatment Patient Details Name: Christian [name] MRN: [id number] DOB: 28-Aug-1975 Today's Date: 11/23/2020   History of present illness Pt is a 45 y/o M who presented to ED on 10/09/20 with c/c of bilateral lower abdominal pain & c/o emesis twice that day after eating. Pt found to have abdominal wall purulent cellulitis. Pt admitted with clinical sepsis & AKI. PMH: morbid obesity, HTN. Pt with increased SOB starting 8/18 and CXR ordered with concern for free air. CT showed pneumoperitoneum suspicious for bowel perforation. Pt now s/p diagnostic laparoscopy converted to exploratory laparotomy, primary repair of cecal perforation and t/f to ICU on 8/19.   OT comments  Christian [name] was seen for OT treatment on this date. Upon arrival to room pt completing PT, agreeable to OT session. Pt reporting rubbing on ears from 0.5L [place], encouraged to doff when seated to assist with weaning O2 (SpO2 97% on RA seated). Pt requires SUPERVISION + RW for toilet t/f, CGA + RW for perihygiene in standing. Pt tolerates ~10 min standing grooming tasks SUPERVISION only, SpO2 93% on RA. Pt trialed don/doff socks seated on BSC - MOD A don/doff w/o AD, MOD I doff B socks using back scratcher. Pt making good progress toward goals. Pt continues to benefit from skilled OT services to maximize return to PLOF and minimize risk of future falls, injury, caregiver burden, and readmission. Will continue to follow POC. Discharge recommendation remains appropriate.     Recommendations for follow up therapy are one component of a multi-disciplinary discharge planning process, led by the attending physician.  Recommendations may be updated based on patient status, additional functional criteria and insurance authorization.    Follow Up Recommendations  SNF;Supervision/Assistance - 24 hour    Equipment Recommendations  Other (comment) (defer to next venue of care)    Recommendations for Other Services      Precautions /  Restrictions Precautions Precautions: Fall Precaution Comments: abdominal surgical incision Restrictions Weight Bearing Restrictions: No       Mobility Bed Mobility               General bed mobility comments: in chair upon arrival    Transfers Overall transfer level: Needs assistance Equipment used: Rolling walker (2 wheeled) Transfers: Sit to/from Stand Sit to Stand: Supervision              Balance Overall balance assessment: Needs assistance Sitting-balance support: Feet supported Sitting balance-[name]: Good     Standing balance support: During functional activity;Single extremity supported Standing balance-[name]: Fair Standing balance comment: single UE support                           ADL either performed or assessed with clinical judgement   ADL Overall ADL's : Needs assistance/impaired                                       General ADL Comments: SUPERVISION + RW for toilet t/f, CGA + RW for perihygiene in standing. Pt tolerates ~10 min standing grooming tasks SUPERVISION only. Pt trialed don/doff socks seated on BSC - MOD A don/doff w/o AD, MOD I doff B socks using back scratcher.      Cognition Arousal/Alertness: Awake/alert Behavior During Therapy: WFL for tasks assessed/performed Overall Cognitive Status: Within Functional Limits for tasks assessed  Exercises Exercises: Other exercises Other Exercises Other Exercises: Pt educated re; d/c recs, DME recs, adapted dressing strategies, ECS Other Exercises: LBD, toileting, grooming, simulated tub t/f, ~30 ft in room mobility, sit<>stand x3      General Comments SpO2 97% on 0.5L , maintained 93% on RA during standing grooming tasks    Pertinent Vitals/ Pain       Pain Assessment: No/denies pain   Frequency  Min 2X/week        Progress Toward Goals  OT Goals(current goals can now be  found in the care plan section)  Progress towards OT goals: Progressing toward goals  Acute Rehab OT Goals Patient Stated Goal: be able to return to work again OT Goal Formulation: With patient Time For Goal Achievement: 11/27/20 Potential to Achieve Goals: Good ADL Goals Pt Will Perform Upper Body Dressing: with supervision;standing Pt Will Perform Lower Body Dressing: with min assist;with adaptive equipment;sit to/from stand Pt Will Transfer to Toilet: with modified independence;ambulating;bedside commode Pt/caregiver will Perform Home Exercise Program: Both right and left upper extremity;With theraband;With written HEP provided;Independently;Increased strength  Plan Discharge plan remains appropriate;Frequency remains appropriate    Co-evaluation                 AM-PAC OT "6 Clicks" Daily Activity     Outcome Measure   Help from another person eating meals?: None Help from another person taking care of personal grooming?: A Little Help from another person toileting, which includes using toliet, bedpan, or urinal?: A Little Help from another person bathing (including washing, rinsing, drying)?: A Lot Help from another person to put on and taking off regular upper body clothing?: A Little Help from another person to put on and taking off regular lower body clothing?: A Lot 6 Click Score: 17    End of Session Equipment Utilized During Treatment: Rolling walker  OT Visit Diagnosis: Unsteadiness on feet (R26.81);Muscle weakness (generalized) (M62.81)   Activity Tolerance Patient tolerated treatment well   Patient Left in chair;with call bell/phone within reach;with chair alarm set   Nurse Communication Mobility status        Time: 4403-4742 OT Time Calculation (min): 29 min  Charges: OT General Charges $OT Visit: 1 Visit OT Treatments $Self Care/Home Management : 23-37 mins  Kathie Dike, M.S. OTR/L  11/23/20, 3:24 PM  ascom [phone]

## 2020-11-23 NOTE — Progress Notes (Signed)
   11/23/20 1030  Clinical Encounter Type  Visited With Patient  Visit Type Initial;Spiritual support;Social support  Referral From Nurse  Consult/Referral To [facility] ministered with peaceful presence, prayer, and reflective listening. PT was able to express his emotions and concerns. PT spoke deeply about his depression and anxiety. His only support group is his church family ([employer]). His Pastor visits him rather frequently, since he been admitted. He spoke briefly of his upbring in [place], and how he has struggled with depression since the age of 68. Chaplain normalized his emotions, and explored coping mechanisms with [name].  Christian [name] M.Div.

## 2020-11-23 NOTE — Progress Notes (Signed)
PROGRESS NOTE    Christian [name]  VFI:[id number] DOB: August 28, 1975 DOA: 10/09/2020 PCP: Oneita Hurt, No     Brief Narrative:  Christian [name] is a 45 y.o. male with a history of morbid obesity, hypertension who presented with lower abdominal pain with evidence of abdominal wall cellulitis and also found to have lower extremity cellulitis.  He was treated with antibiotics with improvement.  During the hospitalization, he developed pneumoperitoneum with cecal perforation requiring surgical repair.  Patient was also found to be hypoxic and hypercapnic requiring BiPAP at night.  Hospitalization complicated by social issues as patient is homeless.   New events last 24 hours / Subjective: Had some coughing episodes this morning.  No other complaints today.  Agreeable to speak with chaplain  Assessment & Plan:   Principal Problem:   Severe sepsis [facility]) Active Problems:   Morbid obesity (HCC)   Hyperglycemia   AKI (acute kidney injury) (HCC)   Pressure ulcer of back   Pressure ulcer of buttock   Essential hypertension   Cellulitis of abdominal wall   Impaired fasting glucose   Acute respiratory failure with hypoxia (HCC)   Weakness   Acute on chronic respiratory failure with hypoxia and hypercapnia (HCC)   Bowel perforation (HCC)   Hypotension   Obesity hypoventilation syndrome (HCC)   Pneumoperitoneum with cecal perforation -Underwent diagnostic laparoscopy converted to open laparotomy on 10/15/2020 with repair of bowel perforation.  Patient has completed IV antibiotic course.  General surgery following intermittently for wound care.  Currently stable.     Abdominal wall cellulitis/lower extremity cellulitis -Has completed course of IV and oral antibiotics along with antifungals.   Severe sepsis, present on admission -Resolved   Right pleural effusion -Status post thoracentesis on 9/19 with removal of 2 L of fluid   Acute on chronic hypoxic and hypercapnic respiratory failure  secondary to acute diastolic heart failure  -Patient noted to have ABG with pH of 7.39 indicating compensated pH, elevated PCO2 of 69 and elevated serum bicarb of 41.  This would indicate Chronic hypercapnic respiratory failure and he would benefit with nocturnal positive pressure therapy. -I suspect his chronic respiratory failure is secondary to obesity hypoventilation/obstructive sleep apnea -Echo with EF 55%  -Wean O2 to room air as able -Continue p.o. Lasix   AKI -Resolved   Weakness/generalized deconditioning -PT recommending SNF.  Social worker following.  Depression/anxiety -Started Zoloft 9/24 -No emergent issues/suicidal ideation to warrant inpatient psych consult for now     In agreement with assessment of the pressure ulcer as below:  Pressure Injury 10/09/20 Buttocks Right Unstageable - Full thickness tissue loss in which the base of the injury is covered by slough (yellow, tan, gray, green or brown) and/or eschar (tan, brown or black) in the wound bed. (Active)  10/09/20 1933  Location: Buttocks  Location Orientation: Right  Staging: Unstageable - Full thickness tissue loss in which the base of the injury is covered by slough (yellow, tan, gray, green or brown) and/or eschar (tan, brown or black) in the wound bed.  Wound Description (Comments):   Present on Admission: Yes     Pressure Injury 10/09/20 Heel Left Stage 2 -  Partial thickness loss of dermis presenting as a shallow open injury with a red, pink wound bed without slough. (Active)  10/09/20   Location: Heel  Location Orientation: Left  Staging: Stage 2 -  Partial thickness loss of dermis presenting as a shallow open injury with a red, pink wound bed without slough.  Wound Description (Comments):   Present on Admission: Yes     Pressure Injury 10/09/20 Heel Right Unstageable - Full thickness tissue loss in which the base of the injury is covered by slough (yellow, tan, gray, green or brown) and/or eschar (tan,  brown or black) in the wound bed. (Active)  10/09/20   Location: Heel  Location Orientation: Right  Staging: Unstageable - Full thickness tissue loss in which the base of the injury is covered by slough (yellow, tan, gray, green or brown) and/or eschar (tan, brown or black) in the wound bed.  Wound Description (Comments):   Present on Admission: Yes     Pressure Injury 10/22/20 Thigh Posterior;Proximal;Right Unstageable - Full thickness tissue loss in which the base of the injury is covered by slough (yellow, tan, gray, green or brown) and/or eschar (tan, brown or black) in the wound bed. (Active)  10/22/20 1055  Location: Thigh  Location Orientation: Posterior;Proximal;Right  Staging: Unstageable - Full thickness tissue loss in which the base of the injury is covered by slough (yellow, tan, gray, green or brown) and/or eschar (tan, brown or black) in the wound bed.  Wound Description (Comments):   Present on Admission: Yes     Nutrition Problem: Increased nutrient needs Etiology: wound healing   DVT prophylaxis: Lovenox  Code Status: Full code Family Communication: None at bedside Disposition Plan:  Status is: Inpatient  Remains inpatient appropriate because:Unsafe d/c plan  Dispo: The patient is from:  Homeless              Anticipated d/c is to:  To be determined              Patient currently is medically stable to d/c.  Awaiting safe dispo plan, need to coordinate hotel stay, payment for BiPAP etc prior to discharge.    Difficult to place patient Yes    Antimicrobials:  Anti-infectives (From admission, onward)    Start     Dose/Rate Route Frequency Ordered Stop   10/17/20 1800  anidulafungin (ERAXIS) 100 mg in sodium chloride 0.9 % 100 mL IVPB  Status:  Discontinued       See Hyperspace for full Linked Orders Report.   100 mg 78 mL/hr over 100 Minutes Intravenous Every 24 hours 10/16/20 1706 10/18/20 1221   10/16/20 1800  anidulafungin (ERAXIS) 200 mg in sodium  chloride 0.9 % 200 mL IVPB       See Hyperspace for full Linked Orders Report.   200 mg 78 mL/hr over 200 Minutes Intravenous  Once 10/16/20 1706 10/16/20 2141   10/15/20 2030  piperacillin-tazobactam (ZOSYN) IVPB 3.375 g        3.375 g 12.5 mL/hr over 240 Minutes Intravenous Every 8 hours 10/15/20 1925 10/19/20 2358   10/12/20 2200  cephALEXin (KEFLEX) capsule 1,000 mg  Status:  Discontinued        1,000 mg Oral Every 8 hours 10/12/20 1416 10/15/20 1917   10/12/20 1600  fluconazole (DIFLUCAN) tablet 200 mg        200 mg Oral Every 24 hours 10/12/20 1417 10/17/20 1559   10/11/20 1600  fluconazole (DIFLUCAN) tablet 400 mg  Status:  Discontinued        400 mg Oral Every 24 hours 10/10/20 1519 10/12/20 1417   10/10/20 1615  fluconazole (DIFLUCAN) tablet 800 mg        800 mg Oral  Once 10/10/20 1519 10/10/20 1641   10/10/20 1000  cefTRIAXone (ROCEPHIN) 2 g in sodium chloride 0.9 %  100 mL IVPB  Status:  Discontinued        2 g 200 mL/hr over 30 Minutes Intravenous Every 24 hours 10/09/20 1236 10/09/20 1408   10/10/20 0000  vancomycin (VANCOREADY) IVPB 1750 mg/350 mL  Status:  Discontinued        1,750 mg 175 mL/hr over 120 Minutes Intravenous Every 12 hours 10/09/20 1722 10/12/20 1415   10/09/20 1430  vancomycin (VANCOREADY) IVPB 1500 mg/300 mL  Status:  Discontinued        1,500 mg 150 mL/hr over 120 Minutes Intravenous  Once 10/09/20 1414 10/10/20 0120   10/09/20 1145  cefTRIAXone (ROCEPHIN) 1 g in sodium chloride 0.9 % 100 mL IVPB        1 g 200 mL/hr over 30 Minutes Intravenous  Once 10/09/20 1139 10/09/20 1356   10/09/20 1145  vancomycin (VANCOCIN) IVPB 1000 mg/200 mL premix        1,000 mg 200 mL/hr over 60 Minutes Intravenous  Once 10/09/20 1139 10/09/20 1656        Objective: Vitals:   11/22/20 0723 11/22/20 1530 11/22/20 2012 11/23/20 0800  BP: 109/65 118/79 112/80 109/63  Pulse: 78 86 82 80  Resp: 20 16 16 20   Temp: 99 F (37.2 C) 97.8 F (36.6 C) 98 F (36.7 C) 98.3  F (36.8 C)  TempSrc: Oral Oral  Oral  SpO2: 99% 93% 93% 95%  Weight:      Height:        Intake/Output Summary (Last 24 hours) at 11/23/2020 1145 Last data filed at 11/23/2020 1012 Gross per 24 hour  Intake 720 ml  Output 1575 ml  Net -855 ml    Filed Weights   11/19/20 1418 11/20/20 0500 11/21/20 0900  Weight: (!) 209.6 kg 88.5 kg (!) 204 kg   Examination: General exam: Appears calm and comfortable  Respiratory system: Clear to auscultation anteriorly. Respiratory effort normal.  On nasal cannula O2 Cardiovascular system: S1 & S2 heard, RRR. No pedal edema. Gastrointestinal system: Abdomen is nondistended, soft and nontender. Normal bowel sounds heard. Central nervous system: Alert and oriented. Non focal exam. Speech clear  Extremities: Symmetric in appearance bilaterally  Psychiatry: Judgement and insight appear stable. Mood & affect appropriate.     Data Reviewed: I have personally reviewed following labs and imaging studies  CBC: Recent Labs  Lab 11/18/20 0517  WBC 9.6  HGB 11.0*  HCT 35.0*  MCV 81.4  PLT 315    Basic Metabolic Panel: Recent Labs  Lab 11/18/20 0517 11/19/20 0358 11/20/20 0544 11/21/20 0515 11/23/20 0502  NA 138 137 136 137 138  K 3.4* 3.2* 3.5 3.6 3.9  CL 94* 91* 93* 95* 100  CO2 36* 34* 34* 32 32  GLUCOSE 109* 110* 110* 111* 103*  BUN 16 16 16 16 18   CREATININE 0.83 0.83 0.80 0.81 0.85  CALCIUM 8.5* 8.5* 8.4* 8.3* 8.4*    GFR: Estimated Creatinine Clearance: 203.2 mL/min (by C-G formula based on SCr of 0.85 mg/dL). Liver Function Tests: No results for input(s): AST, ALT, ALKPHOS, BILITOT, PROT, ALBUMIN in the last 168 hours. No results for input(s): LIPASE, AMYLASE in the last 168 hours. No results for input(s): AMMONIA in the last 168 hours. Coagulation Profile: No results for input(s): INR, PROTIME in the last 168 hours. Cardiac Enzymes: No results for input(s): CKTOTAL, CKMB, CKMBINDEX, TROPONINI in the last 168  hours. BNP (last 3 results) No results for input(s): PROBNP in the last 8760 hours. HbA1C: No results  for input(s): HGBA1C in the last 72 hours. CBG: No results for input(s): GLUCAP in the last 168 hours. Lipid Profile: No results for input(s): CHOL, HDL, LDLCALC, TRIG, CHOLHDL, LDLDIRECT in the last 72 hours. Thyroid Function Tests: No results for input(s): TSH, T4TOTAL, FREET4, T3FREE, THYROIDAB in the last 72 hours. Anemia Panel: No results for input(s): VITAMINB12, FOLATE, FERRITIN, TIBC, IRON, RETICCTPCT in the last 72 hours. Sepsis Labs: No results for input(s): PROCALCITON, LATICACIDVEN in the last 168 hours.  Recent Results (from the past 240 hour(s))  Body fluid culture w Gram Stain     Status: None   Collection Time: 11/16/20  3:51 PM   Specimen: PATH Cytology Pleural fluid  Result Value Ref Range Status   Specimen Description   Final    PLEURAL Performed at [facility], [address], [place], [place] [address]    Special Requests   Final    NONE Performed at [facility], [address] [place], [place] [address]    Gram Stain   Final    MODERATE WBC PRESENT, PREDOMINANTLY MONONUCLEAR NO ORGANISMS SEEN    Culture   Final    NO GROWTH Performed at [facility], [address] [place], [place] [address]    Report Status 11/20/2020 FINAL  Final       Radiology Studies: No results found.    Scheduled Meds:  vitamin C  500 mg Oral BID   collagenase   Topical Daily   enoxaparin (LOVENOX) injection  100 mg Subcutaneous Q24H   feeding supplement  237 mL Oral TID BM   folic acid  1 mg Oral Daily   furosemide  40 mg Oral BID   hydrocerin   Topical BID   iron polysaccharides  150 mg Oral Daily   multivitamin with minerals  1 tablet Oral Daily   potassium chloride  20 mEq Oral BID   sertraline  25 mg Oral Daily   vitamin B-12  100 mcg Oral Daily   Vitamin D (Ergocalciferol)  50,000 Units Oral Q7 days   Continuous  Infusions:   LOS: 44 days      Time spent: 20 minutes   Noralee Stain, DO [facility] 11/23/2020, 11:45 AM   Available via Epic secure chat 7am-7pm After these hours, please refer to coverage provider listed on [email or web address]

## 2020-11-24 NOTE — Progress Notes (Signed)
Patient ID: Christian [name], male   DOB: 01-07-1976, 46 y.o.   MRN: [id number]  Dressings changed today on abdomen and bilateral heels. Patient declined wound care on R thigh and R buttock at this time.  Lidia Collum, RN

## 2020-11-24 NOTE — TOC Progression Note (Signed)
Transition of Care [facility]) - Progression Note    Patient Details  Name: Christian [name] MRN: [id number] Date of Birth: [birth year]  [facility] of [facility]) CM/SW Contact  Maree Krabbe, LCSW Phone Number: 11/24/2020, 11:36 AM  Clinical Narrative:  Disability Referral form, authorization to disclose information, and homeless verification form faxed to [facility] at number listed on referral form [id number]). Pt signed, is aware, and agreeable.       Barriers to Discharge: Continued Medical Work up  Expected Discharge Plan and Services         Living arrangements for the past 2 months: Homeless                                       Social Determinants of Health (SDOH) Interventions    Readmission Risk Interventions Readmission Risk Prevention Plan 10/11/2020  Post Dischage Appt Complete  Medication Screening Complete  Transportation Screening Complete  Some recent data might be hidden

## 2020-11-24 NOTE — Progress Notes (Signed)
PROGRESS NOTE    Tavis Kring  TDV:[id number] DOB: 24-Apr-1975 DOA: 10/09/2020 PCP: Oneita Hurt, No     Brief Narrative:  Christian [name] is a 45 y.o. male with a history of morbid obesity, hypertension who presented with lower abdominal pain with evidence of abdominal wall cellulitis and also found to have lower extremity cellulitis.  He was treated with antibiotics with improvement.  During the hospitalization, he developed pneumoperitoneum with cecal perforation requiring surgical repair.  Patient was also found to be hypoxic and hypercapnic requiring BiPAP at night.  Hospitalization complicated by social issues as patient is homeless.   New events last 24 hours / Subjective: Patient sitting in a recliner eating breakfast.  No new complaints.  Assessment & Plan:   Principal Problem:   Severe sepsis (HCC) Active Problems:   Morbid obesity (HCC)   Hyperglycemia   AKI (acute kidney injury) (HCC)   Pressure ulcer of back   Pressure ulcer of buttock   Essential hypertension   Cellulitis of abdominal wall   Impaired fasting glucose   Acute respiratory failure with hypoxia (HCC)   Weakness   Acute on chronic respiratory failure with hypoxia and hypercapnia (HCC)   Bowel perforation (HCC)   Hypotension   Obesity hypoventilation syndrome (HCC)   Pneumoperitoneum with cecal perforation -Underwent diagnostic laparoscopy converted to open laparotomy on 10/15/2020 with repair of bowel perforation.  Patient has completed IV antibiotic course.  General surgery following intermittently for wound care.  Currently stable.     Abdominal wall cellulitis/lower extremity cellulitis -Has completed course of IV and oral antibiotics along with antifungals.   Severe sepsis, present on admission -Resolved   Right pleural effusion -Status post thoracentesis on 9/19 with removal of 2 L of fluid   Acute on chronic hypoxic and hypercapnic respiratory failure secondary to acute diastolic heart failure   -Patient noted to have ABG with pH of 7.39 indicating compensated pH, elevated PCO2 of 69 and elevated serum bicarb of 41.  This would indicate Chronic hypercapnic respiratory failure and he would benefit with nocturnal positive pressure therapy. -I suspect his chronic respiratory failure is secondary to obesity hypoventilation/obstructive sleep apnea -Echo with EF 55%  -Wean O2 to room air as able -Continue p.o. Lasix   AKI -Resolved   Weakness/generalized deconditioning -PT recommending SNF.  Social worker following.  Depression/anxiety -Started Zoloft 9/24 -No emergent issues/suicidal ideation to warrant inpatient psych consult for now     In agreement with assessment of the pressure ulcer as below:  Pressure Injury 10/09/20 Buttocks Right Unstageable - Full thickness tissue loss in which the base of the injury is covered by slough (yellow, tan, gray, green or brown) and/or eschar (tan, brown or black) in the wound bed. (Active)  10/09/20 1933  Location: Buttocks  Location Orientation: Right  Staging: Unstageable - Full thickness tissue loss in which the base of the injury is covered by slough (yellow, tan, gray, green or brown) and/or eschar (tan, brown or black) in the wound bed.  Wound Description (Comments):   Present on Admission: Yes     Pressure Injury 10/09/20 Heel Left Stage 2 -  Partial thickness loss of dermis presenting as a shallow open injury with a red, pink wound bed without slough. (Active)  10/09/20   Location: Heel  Location Orientation: Left  Staging: Stage 2 -  Partial thickness loss of dermis presenting as a shallow open injury with a red, pink wound bed without slough.  Wound Description (Comments):  Present on Admission: Yes     Pressure Injury 10/09/20 Heel Right Unstageable - Full thickness tissue loss in which the base of the injury is covered by slough (yellow, tan, gray, green or brown) and/or eschar (tan, brown or black) in the wound bed. (Active)   10/09/20   Location: Heel  Location Orientation: Right  Staging: Unstageable - Full thickness tissue loss in which the base of the injury is covered by slough (yellow, tan, gray, green or brown) and/or eschar (tan, brown or black) in the wound bed.  Wound Description (Comments):   Present on Admission: Yes     Pressure Injury 10/22/20 Thigh Posterior;Proximal;Right Unstageable - Full thickness tissue loss in which the base of the injury is covered by slough (yellow, tan, gray, green or brown) and/or eschar (tan, brown or black) in the wound bed. (Active)  10/22/20 1055  Location: Thigh  Location Orientation: Posterior;Proximal;Right  Staging: Unstageable - Full thickness tissue loss in which the base of the injury is covered by slough (yellow, tan, gray, green or brown) and/or eschar (tan, brown or black) in the wound bed.  Wound Description (Comments):   Present on Admission: Yes     Nutrition Problem: Increased nutrient needs Etiology: wound healing   DVT prophylaxis: Lovenox  Code Status: Full code Family Communication: None at bedside Disposition Plan:  Status is: Inpatient  Remains inpatient appropriate because:Unsafe d/c plan  Dispo: The patient is from:  Homeless              Anticipated d/c is to:  To be determined              Patient currently is medically stable to d/c.  Awaiting safe dispo plan, need to coordinate hotel stay, payment for BiPAP etc prior to discharge.    Difficult to place patient Yes    Antimicrobials:  Anti-infectives (From admission, onward)    Start     Dose/Rate Route Frequency Ordered Stop   10/17/20 1800  anidulafungin (ERAXIS) 100 mg in sodium chloride 0.9 % 100 mL IVPB  Status:  Discontinued       See Hyperspace for full Linked Orders Report.   100 mg 78 mL/hr over 100 Minutes Intravenous Every 24 hours 10/16/20 1706 10/18/20 1221   10/16/20 1800  anidulafungin (ERAXIS) 200 mg in sodium chloride 0.9 % 200 mL IVPB       See Hyperspace  for full Linked Orders Report.   200 mg 78 mL/hr over 200 Minutes Intravenous  Once 10/16/20 1706 10/16/20 2141   10/15/20 2030  piperacillin-tazobactam (ZOSYN) IVPB 3.375 g        3.375 g 12.5 mL/hr over 240 Minutes Intravenous Every 8 hours 10/15/20 1925 10/19/20 2358   10/12/20 2200  cephALEXin (KEFLEX) capsule 1,000 mg  Status:  Discontinued        1,000 mg Oral Every 8 hours 10/12/20 1416 10/15/20 1917   10/12/20 1600  fluconazole (DIFLUCAN) tablet 200 mg        200 mg Oral Every 24 hours 10/12/20 1417 10/17/20 1559   10/11/20 1600  fluconazole (DIFLUCAN) tablet 400 mg  Status:  Discontinued        400 mg Oral Every 24 hours 10/10/20 1519 10/12/20 1417   10/10/20 1615  fluconazole (DIFLUCAN) tablet 800 mg        800 mg Oral  Once 10/10/20 1519 10/10/20 1641   10/10/20 1000  cefTRIAXone (ROCEPHIN) 2 g in sodium chloride 0.9 % 100 mL IVPB  Status:  Discontinued        2 g 200 mL/hr over 30 Minutes Intravenous Every 24 hours 10/09/20 1236 10/09/20 1408   10/10/20 0000  vancomycin (VANCOREADY) IVPB 1750 mg/350 mL  Status:  Discontinued        1,750 mg 175 mL/hr over 120 Minutes Intravenous Every 12 hours 10/09/20 1722 10/12/20 1415   10/09/20 1430  vancomycin (VANCOREADY) IVPB 1500 mg/300 mL  Status:  Discontinued        1,500 mg 150 mL/hr over 120 Minutes Intravenous  Once 10/09/20 1414 10/10/20 0120   10/09/20 1145  cefTRIAXone (ROCEPHIN) 1 g in sodium chloride 0.9 % 100 mL IVPB        1 g 200 mL/hr over 30 Minutes Intravenous  Once 10/09/20 1139 10/09/20 1356   10/09/20 1145  vancomycin (VANCOCIN) IVPB 1000 mg/200 mL premix        1,000 mg 200 mL/hr over 60 Minutes Intravenous  Once 10/09/20 1139 10/09/20 1656        Objective: Vitals:   11/24/20 0035 11/24/20 0045 11/24/20 0442 11/24/20 0829  BP:  120/67 118/70 111/70  Pulse: 86 88 75 83  Resp:  (!) 22 20 20   Temp:  98.8 F (37.1 C) 98.4 F (36.9 C) 98.5 F (36.9 C)  TempSrc:  Oral Oral Oral  SpO2: 97% 99% 99% 95%   Weight:      Height:        Intake/Output Summary (Last 24 hours) at 11/24/2020 1054 Last data filed at 11/24/2020 0900 Gross per 24 hour  Intake 600 ml  Output 1150 ml  Net -550 ml    Filed Weights   11/19/20 1418 11/20/20 0500 11/21/20 0900  Weight: (!) 209.6 kg 88.5 kg (!) 204 kg   Examination: General exam: Appears calm and comfortable  Respiratory system: Clear to auscultation anteriorly. Respiratory effort normal.  On room air at rest today Cardiovascular system: S1 & S2 heard, RRR. No pedal edema. Gastrointestinal system: Abdomen is nondistended, soft  Central nervous system: Alert  Extremities: Symmetric in appearance bilaterally  Psychiatry:  Mood & affect appropriate.      Data Reviewed: I have personally reviewed following labs and imaging studies  CBC: Recent Labs  Lab 11/18/20 0517  WBC 9.6  HGB 11.0*  HCT 35.0*  MCV 81.4  PLT 315    Basic Metabolic Panel: Recent Labs  Lab 11/18/20 0517 11/19/20 0358 11/20/20 0544 11/21/20 0515 11/23/20 0502  NA 138 137 136 137 138  K 3.4* 3.2* 3.5 3.6 3.9  CL 94* 91* 93* 95* 100  CO2 36* 34* 34* 32 32  GLUCOSE 109* 110* 110* 111* 103*  BUN 16 16 16 16 18   CREATININE 0.83 0.83 0.80 0.81 0.85  CALCIUM 8.5* 8.5* 8.4* 8.3* 8.4*    GFR: Estimated Creatinine Clearance: 203.2 mL/min (by C-G formula based on SCr of 0.85 mg/dL). Liver Function Tests: No results for input(s): AST, ALT, ALKPHOS, BILITOT, PROT, ALBUMIN in the last 168 hours. No results for input(s): LIPASE, AMYLASE in the last 168 hours. No results for input(s): AMMONIA in the last 168 hours. Coagulation Profile: No results for input(s): INR, PROTIME in the last 168 hours. Cardiac Enzymes: No results for input(s): CKTOTAL, CKMB, CKMBINDEX, TROPONINI in the last 168 hours. BNP (last 3 results) No results for input(s): PROBNP in the last 8760 hours. HbA1C: No results for input(s): HGBA1C in the last 72 hours. CBG: No results for input(s):  GLUCAP in the last 168 hours.  Lipid Profile: No results for input(s): CHOL, HDL, LDLCALC, TRIG, CHOLHDL, LDLDIRECT in the last 72 hours. Thyroid Function Tests: No results for input(s): TSH, T4TOTAL, FREET4, T3FREE, THYROIDAB in the last 72 hours. Anemia Panel: No results for input(s): VITAMINB12, FOLATE, FERRITIN, TIBC, IRON, RETICCTPCT in the last 72 hours. Sepsis Labs: No results for input(s): PROCALCITON, LATICACIDVEN in the last 168 hours.  Recent Results (from the past 240 hour(s))  Body fluid culture w Gram Stain     Status: None   Collection Time: 11/16/20  3:51 PM   Specimen: PATH Cytology Pleural fluid  Result Value Ref Range Status   Specimen Description   Final    PLEURAL Performed at [facility], [address] [place], [place] [address]    Special Requests   Final    NONE Performed at [facility], [address] [place], [place] [address]    Gram Stain   Final    MODERATE WBC PRESENT, PREDOMINANTLY MONONUCLEAR NO ORGANISMS SEEN    Culture   Final    NO GROWTH Performed at [facility], [address] [place], [place] [address]    Report Status 11/20/2020 FINAL  Final       Radiology Studies: No results found.    Scheduled Meds:  vitamin C  500 mg Oral BID   collagenase   Topical Daily   enoxaparin (LOVENOX) injection  100 mg Subcutaneous Q24H   feeding supplement  237 mL Oral TID BM   folic acid  1 mg Oral Daily   furosemide  40 mg Oral BID   hydrocerin   Topical BID   iron polysaccharides  150 mg Oral Daily   multivitamin with minerals  1 tablet Oral Daily   potassium chloride  20 mEq Oral BID   sertraline  25 mg Oral Daily   vitamin B-12  100 mcg Oral Daily   Vitamin D (Ergocalciferol)  50,000 Units Oral Q7 days   Continuous Infusions:   LOS: 45 days      Time spent: 15 minutes   Noralee Stain, DO [facility] 11/24/2020, 10:54 AM   Available via Epic secure chat 7am-7pm After these hours, please  refer to coverage provider listed on [email or web address]

## 2020-11-24 NOTE — Progress Notes (Signed)
Mobility Specialist - Progress Note   11/24/20 1300  Mobility  Activity Ambulated in hall  Level of Assistance Modified independent, requires aide device or extra time  Assistive Device Front wheel walker  Distance Ambulated (ft) 130 ft  Mobility Ambulated with assistance in hallway  Mobility Response Tolerated well  Mobility performed by Mobility specialist  $Mobility charge 1 Mobility    Pre-mobility: 88 HR, 93% SpO2 During mobility: 103 HR, 87-90% SpO2 Post-mobility: 93 HR, 94% SpO2   Pt ambulated in hallway on RA. O2 maintained 87-90% during ambulation. 2 standing rest breaks taken. Mildly winded post-activity. Pt left in recliner with lunch/needs placed in reach.    Christian [name] Mobility Specialist 11/24/20, 1:25 PM

## 2020-11-24 NOTE — Progress Notes (Signed)
Nutrition Follow-up  DOCUMENTATION CODES:  Morbid obesity  INTERVENTION:  Continue Ensure TID.  Continue MVI with minerals daily.  Continue Vitamin C 500 mg po daily.  Continue double protein portions with meals.  NUTRITION DIAGNOSIS:  Increased nutrient needs related to wound healing as evidenced by estimated needs. - ongoing  GOAL:  Patient will meet greater than or equal to 90% of their needs - meeting  MONITOR:  PO intake, Supplement acceptance, Labs, Weight trends, Skin, I & O's  REASON FOR ASSESSMENT:  LOS    ASSESSMENT:  45 yo male with a PMH of morbid obesity and HTN who presents with severe sepsis 2/2 abdominal wall and bilateral lower extremity cellulitis. Also with pneumoperitoneum with cecal perforation (s/p ex lap) and AKI (now resolved).  Over the last 8 meals, patient has consumed an average of 83%. (6 at 100%, 1 at 60%, and 1 at 0% completion documented). Pt continues to have good appetite and oral intake in hospital; drinking Ensure supplements.   Pt is also receiving double protein with meal trays. Juven discontinued r/t backorder; vitamin C added to support wound healing.  Admit wt: 197.3 kg Current wt: 204 kg  Continue current nutrition plan.  Supplements: Ensure TID  Medications: reviewed; Vitamin C BID, folic acid, Lasix, iron polysaccharides, MVI with minerals, Klor-Con 20 mEq BID, Vitamin B12, Vitamin D 64332 units weekly  Labs: reviewed; Glucose 103 (H)  Diet Order:   Diet Order             Diet Heart Room service appropriate? Yes; Fluid consistency: Thin; Fluid restriction: 2000 mL Fluid  Diet effective 1400                  EDUCATION NEEDS:  Education needs have been addressed  Skin:  Skin Assessment: Skin Integrity Issues: Skin Integrity Issues:: Stage II, Unstageable, Incisions Stage II: L heel, R heel, R foot Unstageable: L buttocks and R buttocks Incisions: Abdomen, closed  Last BM:  11/23/20 - smear  Height:  Ht  Readings from Last 1 Encounters:  10/09/20 6\' 3"  (1.905 m)   Weight:  Wt Readings from Last 1 Encounters:  11/21/20 (!) 204 kg   BMI:  Body mass index is 56.21 kg/m.  Estimated Nutritional Needs:  Kcal:  3200-3500kcal/day Protein:  >160g/day Fluid:  2.7-3.0L/day  11/23/20, RD, LDN (she/her/hers) Registered Dietitian I After-Hours/Weekend Pager # in [place]

## 2020-11-24 NOTE — Plan of Care (Signed)

## 2020-11-25 DIAGNOSIS — F32A Depression, unspecified: Secondary | ICD-10-CM

## 2020-11-25 DIAGNOSIS — I5032 Chronic diastolic (congestive) heart failure: Secondary | ICD-10-CM

## 2020-11-25 DIAGNOSIS — J90 Pleural effusion, not elsewhere classified: Secondary | ICD-10-CM | POA: Diagnosis present

## 2020-11-25 LAB — BASIC METABOLIC PANEL
Anion gap: 8 (ref 5–15)
BUN: 18 mg/dL (ref 6–20)
CO2: 31 mmol/L (ref 22–32)
Calcium: 8.3 mg/dL — ABNORMAL LOW (ref 8.9–10.3)
Chloride: 100 mmol/L (ref 98–111)
Creatinine, Ser: 0.77 mg/dL (ref 0.61–1.24)
GFR, Estimated: >60 mL/min (ref >60)
Glucose, Bld: 102 mg/dL — ABNORMAL HIGH (ref 70–99)
Potassium: 3.8 mmol/L (ref 3.5–5.1)
Sodium: 139 mmol/L (ref 135–145)

## 2020-11-25 NOTE — Assessment & Plan Note (Signed)
No evidence of hypoxia or reaccumulated pleural effusion

## 2020-11-25 NOTE — Assessment & Plan Note (Signed)
Appears euvolemic.  Continue Lasix. 

## 2020-11-25 NOTE — Assessment & Plan Note (Signed)
Continue Zoloft 

## 2020-11-25 NOTE — Progress Notes (Signed)
Physical Therapy Treatment Patient Details Name: Christian [name] MRN: [id number] DOB: [birth year] Today's Date: 11/25/2020   History of Present Illness Pt is a 45 y/o M who presented to ED on 10/09/20 with c/c of bilateral lower abdominal pain & c/o emesis twice that day after eating. Pt found to have abdominal wall purulent cellulitis. Pt admitted with clinical sepsis & AKI. PMH: morbid obesity, HTN. Pt with increased SOB starting 8/18 and CXR ordered with concern for free air. CT showed pneumoperitoneum suspicious for bowel perforation. Pt now s/p diagnostic laparoscopy converted to exploratory laparotomy, primary repair of cecal perforation and t/f to ICU on 8/19.    PT Comments    Pt completed gait training this am on RA with support of bariatric RW ~272ft with supervision, no LOB noted, vc's for pacing and PLB technique.  Pt's vitals stable with O2 sats remaining above 90% upon exertion.    Recommendations for follow up therapy are one component of a multi-disciplinary discharge planning process, led by the attending physician.  Recommendations may be updated based on patient status, additional functional criteria and insurance authorization.  Follow Up Recommendations  SNF;Supervision/Assistance - 24 hour;Supervision for mobility/OOB     Equipment Recommendations  Other (comment);3in1 (PT);Rolling walker with 5" wheels    Recommendations for Other Services       Precautions / Restrictions Precautions Precautions: Fall Precaution Comments: abdominal surgical incision Restrictions Weight Bearing Restrictions: No     Mobility  Bed Mobility                    Transfers Overall transfer level: Needs assistance Equipment used: Rolling walker (2 wheeled) Transfers: Sit to/from Stand Sit to Stand: Supervision Stand pivot transfers: Min guard      Lateral/Scoot Transfers: Min guard General transfer comment: Good eccentric and concentric  control  Ambulation/Gait Ambulation/Gait assistance: Supervision Gait Distance (Feet): 200 Feet Assistive device: Rolling walker (2 wheeled) Gait Pattern/deviations: Wide base of support;Trunk flexed Gait velocity: decreased   General Gait Details:  (Total of 240ft with 2 standing rest breaks)   Stairs             Wheelchair Mobility    Modified Rankin (Stroke Patients Only)       Balance Overall balance assessment: Needs assistance Sitting-balance support: Feet supported Sitting balance-[name]: Good Sitting balance - Comments: no LOB with sitting balance/ reaching inside BOS/outside BOS   Standing balance support: During functional activity;Single extremity supported Standing balance-[name]: Fair Standing balance comment: single UE support                            Cognition Arousal/Alertness: Awake/alert Behavior During Therapy: WFL for tasks assessed/performed Overall Cognitive Status: Within Functional Limits for tasks assessed                                 General Comments:  (Pt educated on importance of maintaining good skin integrity and frequent pressure releif through changing positions every 2 hours.)      Exercises Total Joint Exercises Ankle Circles/Pumps: Strengthening;Both;10 reps Long Arc Quad: Strengthening;Both;10 reps    General Comments        Pertinent Vitals/Pain Pain Assessment: No/denies pain    Home Living                      Prior Function  PT Goals (current goals can now be found in the care plan section) Acute Rehab PT Goals Patient Stated Goal: be able to return to work again    Frequency    Min 2X/week      PT Plan Current plan remains appropriate    Co-evaluation              AM-PAC PT "6 Clicks" Mobility   Outcome Measure  Help needed turning from your back to your side while in a flat bed without using bedrails?: A Little Help needed  moving from lying on your back to sitting on the side of a flat bed without using bedrails?: A Little Help needed moving to and from a bed to a chair (including a wheelchair)?: A Little Help needed standing up from a chair using your arms (e.g., wheelchair or bedside chair)?: A Little Help needed to walk in hospital room?: A Little Help needed climbing 3-5 steps with a railing? : A Lot 6 Click Score: 17    End of Session Equipment Utilized During Treatment: Gait belt Activity Tolerance: Patient tolerated treatment well Patient left: in chair;with call bell/phone within reach;with chair alarm set Nurse Communication: Mobility status PT Visit Diagnosis: Muscle weakness (generalized) (M62.81);Difficulty in walking, not elsewhere classified (R26.2)     Time: 4650-3546 PT Time Calculation (min) (ACUTE ONLY): 28 min  Charges:  $Gait Training: 8-22 mins $Therapeutic Exercise: 8-22 mins                    Zadie Cleverly, PTA    Jannet Askew 11/25/2020, 12:33 PM

## 2020-11-25 NOTE — Progress Notes (Signed)
Progress Note    Christian [name]   ZOX:[id number]  DOB: [birth year]  DOA: 10/09/2020     45 Date of Service: 11/25/2020     Subjective:  No headache, chest pain, abdominal pain, fever, respiratory distress.  Hospital Problems Chronic diastolic CHF (congestive heart failure) (HCC) Appears euvolemic - Continue Lasix  Depression - Continue Zoloft  Pleural effusion No evidence of hypoxia or reaccumulated pleural effusion   Pneumoperitoneum with cecal perforation Resolved, general surgery following intermittently for wound care, needs outpatient follow-up.    Abdominal wall cellulitis/lower extremity cellulitis Has completed course of IV and oral antibiotics along with antifungals.  Severe sepsis, present on admission Resolved  Acute onchronic hypoxic and hypercapnic respiratory failure Acute respiratory failure resolved he has chronic respiratory failure and requires BiPAP.  AKI Resolved      Objective Vital signs were reviewed and unremarkable.  Vitals:   11/25/20 0500 11/25/20 0840 11/25/20 1632 11/25/20 1928  BP:  120/61 122/71 137/70  Pulse:  79 85 84  Resp:  18 20 20   Temp:  (!) 97.5 F (36.4 C) 97.8 F (36.6 C) 98.2 F (36.8 C)  TempSrc:  Oral Oral Oral  SpO2:  96% 93% 92%  Weight: (!) 204.6 kg     Height:       (!) 204.6 kg   Exam Physical Exam Constitutional:      General: He is not in acute distress. HENT:     Nose: No nasal deformity or rhinorrhea.     Mouth/Throat:     Lips: Pink. No lesions.     Mouth: Mucous membranes are moist. No oral lesions.     Dentition: Normal dentition.     Pharynx: Oropharynx is clear. No posterior oropharyngeal erythema.  Eyes:     General: Lids are normal. Gaze aligned appropriately.     Extraocular Movements: Extraocular movements intact.     Conjunctiva/sclera: Conjunctivae normal.  Cardiovascular:     Rate and Rhythm: Normal rate and regular rhythm.     Pulses:          Radial pulses are 2+  on the right side and 2+ on the left side.     Heart sounds: Normal heart sounds, S1 normal and S2 normal. No murmur heard. Pulmonary:     Effort: Pulmonary effort is normal. No respiratory distress.     Breath sounds: No wheezing or rales.  Abdominal:     General: There is no distension.     Palpations: Abdomen is soft.     Tenderness: There is no abdominal tenderness. There is no guarding or rebound.  Musculoskeletal:     Right lower leg: No edema.     Left lower leg: No edema.  Skin:    General: Skin is warm and dry.     Findings: No lesion or rash.  Neurological:     Mental Status: He is alert and oriented to person, place, and time.     Cranial Nerves: Cranial nerves are intact.     Motor: No weakness.  Psychiatric:        Attention and Perception: Attention normal.        Mood and Affect: Mood and affect normal.        Behavior: Behavior is cooperative.        Cognition and Memory: Memory normal.        Judgment: Judgment normal.       Labs / Other Information My review of labs, imaging, notes and  other tests shows no new significant findings.     Time spent: 15 minutes [facility] 11/25/2020, 9:29 PM

## 2020-11-25 NOTE — Progress Notes (Signed)
Occupational Therapy Treatment Patient Details Name: Christian [name] MRN: [id number] DOB: 1975-09-25 Today's Date: 11/25/2020   History of present illness Pt is a 45 y/o M who presented to ED on 10/09/20 with c/c of bilateral lower abdominal pain & c/o emesis twice that day after eating. Pt found to have abdominal wall purulent cellulitis. Pt admitted with clinical sepsis & AKI. PMH: morbid obesity, HTN. Pt with increased SOB starting 8/18 and CXR ordered with concern for free air. CT showed pneumoperitoneum suspicious for bowel perforation. Pt now s/p diagnostic laparoscopy converted to exploratory laparotomy, primary repair of cecal perforation and t/f to ICU on 8/19.   OT comments  Christian [name] was seen for OT treatment on this date. Upon arrival to room pt seated in chair. Pt requires SUPERVISION + RW for simulated tub t/f. SETUP seated grooming tasks. Pt tolerated ~15 min standing therex as described below. Multiple rest breaks t/o. SpO2 94% on RA. Pt making good progress toward goals. Pt continues to benefit from skilled OT services to maximize return to PLOF and minimize risk of future falls, injury, caregiver burden, and readmission. Will continue to follow POC. Discharge recommendation remains appropriate.     Recommendations for follow up therapy are one component of a multi-disciplinary discharge planning process, led by the attending physician.  Recommendations may be updated based on patient status, additional functional criteria and insurance authorization.    Follow Up Recommendations  SNF;Supervision/Assistance - 24 hour    Equipment Recommendations  Other (comment) (defer to next venue of care)    Recommendations for Other Services      Precautions / Restrictions Precautions Precautions: Fall Precaution Comments: abdominal surgical incision Restrictions Weight Bearing Restrictions: No       Mobility Bed Mobility               General bed mobility comments: in chair  upon arrival    Transfers Overall transfer level: Needs assistance Equipment used: Rolling walker (2 wheeled) Transfers: Sit to/from Stand Sit to Stand: Supervision Stand pivot transfers: Supervision      Lateral/Scoot Transfers: Min guard General transfer comment: Good eccentric and concentric control    Balance Overall balance assessment: Needs assistance Sitting-balance support: Feet supported Sitting balance-[name]: Good Sitting balance - Comments: no LOB with sitting balance/ reaching inside BOS/outside BOS   Standing balance support: Single extremity supported;During functional activity Standing balance-[name]: Good Standing balance comment: LOBs with single UE and sinlge leg stance                           ADL either performed or assessed with clinical judgement   ADL Overall ADL's : Needs assistance/impaired                                       General ADL Comments: SUPERVISION + RW for simulated tub t/f. Pt requires SETUP seated grooming tasks.      Cognition Arousal/Alertness: Awake/alert Behavior During Therapy: WFL for tasks assessed/performed Overall Cognitive Status: Within Functional Limits for tasks assessed                                 General Comments:  (Pt educated on importance of maintaining good skin integrity and frequent pressure releif through changing positions every 2 hours.)  Exercises Exercises: Other exercises;General Lower ExtremityGeneral Exercises - Lower Extremity Hip ABduction/ADduction: AROM;Strengthening;Both;15 reps;Standing Straight Leg Raises: AROM;Strengthening;Both;15 reps;Standing Hip Flexion/Marching: AROM;Strengthening;Both;15 reps;Standing Toe Raises: AROM;Strengthening;Both;15 reps;Standing Heel Raises: AROM;Strengthening;Both;15 reps;Standing Mini-Sqauts: AROM;Strengthening;Both;15 reps;Standing           Pertinent Vitals/ Pain       Pain Assessment:  No/denies pain   Frequency  Min 2X/week        Progress Toward Goals  OT Goals(current goals can now be found in the care plan section)  Progress towards OT goals: Progressing toward goals  Acute Rehab OT Goals Patient Stated Goal: be able to return to work again OT Goal Formulation: With patient Time For Goal Achievement: 11/27/20 Potential to Achieve Goals: Good ADL Goals Pt Will Perform Upper Body Dressing: with supervision;standing Pt Will Perform Lower Body Dressing: with min assist;with adaptive equipment;sit to/from stand Pt Will Transfer to Toilet: with modified independence;ambulating;bedside commode Pt/caregiver will Perform Home Exercise Program: Both right and left upper extremity;With theraband;With written HEP provided;Independently;Increased strength  Plan Frequency remains appropriate;Discharge plan remains appropriate    Co-evaluation                 AM-PAC OT "6 Clicks" Daily Activity     Outcome Measure   Help from another person eating meals?: None Help from another person taking care of personal grooming?: A Little Help from another person toileting, which includes using toliet, bedpan, or urinal?: A Little Help from another person bathing (including washing, rinsing, drying)?: A Lot Help from another person to put on and taking off regular upper body clothing?: A Little Help from another person to put on and taking off regular lower body clothing?: A Lot 6 Click Score: 17    End of Session Equipment Utilized During Treatment: Rolling walker  OT Visit Diagnosis: Unsteadiness on feet (R26.81);Muscle weakness (generalized) (M62.81)   Activity Tolerance Patient tolerated treatment well   Patient Left in chair;with call bell/phone within reach   Nurse Communication Mobility status        Time: 1126-1150 OT Time Calculation (min): 24 min  Charges: OT General Charges $OT Visit: 1 Visit OT Treatments $Self Care/Home Management : 8-22  mins $Therapeutic Exercise: 8-22 mins  Kathie Dike, M.S. OTR/L  11/25/20, 1:34 PM  ascom [phone]

## 2020-11-26 NOTE — Assessment & Plan Note (Signed)
No evidence of hypoxia or reaccumulated pleural effusion 

## 2020-11-26 NOTE — Progress Notes (Signed)
  Progress Note    Christian [name]   POE:[id number]  DOB: 06/18/1975  DOA: 10/09/2020     46 Date of Service: 11/26/2020     Subjective:  No chest pain, swelling, dyspnea, abdominal pain.  No confusion.  Hospital Problems Chronic diastolic CHF (congestive heart failure) (HCC) Appears euvolemic - Continue Lasix  Depression - Continue Zoloft  Pleural effusion No evidence of hypoxia or reaccumulated pleural effusion     Objective Vital signs were reviewed and unremarkable.  Vitals:   11/25/20 1928 11/26/20 0400 11/26/20 0833 11/26/20 1541  BP: 137/70 113/72 122/62 103/70  Pulse: 84 79 76 82  Resp: 20 18 18 18   Temp: 98.2 F (36.8 C) 98.3 F (36.8 C) 97.8 F (36.6 C) 98.6 F (37 C)  TempSrc: Oral  Oral Oral  SpO2: 92% 99% 97% 94%  Weight:      Height:       (!) 204.6 kg   Exam General appearance: Obese adult male, sitting in recliner, no acute distress     HEENT:    Skin:  Cardiac: RRR, no murmurs, no lower extremity edema Respiratory: Normal respiratory rate and rhythm, lung sounds diminished, no rales or wheezes appreciated Abdomen: Abdomen soft without tenderness palpation or guarding, no ascites or distention MSK:  Neuro: Awake and alert face symmetric, speech fluent, upper extremity strength normal in both arms Psych: Attention normal, affect appropriate, judgment insight appear normal    Labs / Other Information My review of labs, imaging, notes and other tests shows no new significant findings.     Time spent: 15 minutes [facility] 11/26/2020, 6:00 PM

## 2020-11-26 NOTE — Assessment & Plan Note (Signed)
Continue Zoloft 

## 2020-11-26 NOTE — Assessment & Plan Note (Signed)
Appears euvolemic.  Continue Lasix. 

## 2020-11-26 NOTE — Progress Notes (Signed)
Physical Therapy Treatment Patient Details Name: Christian [name] MRN: [id number] DOB: 16-Mar-1975 Today's Date: 11/26/2020   History of Present Illness Pt is a 45 y/o M who presented to ED on 10/09/20 with c/c of bilateral lower abdominal pain & c/o emesis twice that day after eating. Pt found to have abdominal wall purulent cellulitis. Pt admitted with clinical sepsis & AKI. PMH: morbid obesity, HTN. Pt with increased SOB starting 8/18 and CXR ordered with concern for free air. CT showed pneumoperitoneum suspicious for bowel perforation. Pt now s/p diagnostic laparoscopy converted to exploratory laparotomy, primary repair of cecal perforation and t/f to ICU on 8/19.    PT Comments    Pt was in BR on toilet having BM upon arriving. He agrees to session and is cooperative and motivated throughout. Pt present with flat affect but is conversational. He required 2 attempts to stand however no physical assistance. Ambulated 200 ft with RW without seated rest. Did take 2 standing rest to check sao2. Sao2 > 92 % on rm air throughout session. Overall pt continues to demonstrate progress. Will continue to follow and progress as able per current POC.    Recommendations for follow up therapy are one component of a multi-disciplinary discharge planning process, led by the attending physician.  Recommendations may be updated based on patient status, additional functional criteria and insurance authorization.  Follow Up Recommendations  SNF;Supervision/Assistance - 24 hour;Supervision for mobility/OOB     Equipment Recommendations  Rolling walker with 5" wheels;3in1 (PT) (bariatric)       Precautions / Restrictions Precautions Precautions: Fall Precaution Comments: abdominal surgical incision Restrictions Weight Bearing Restrictions: No     Mobility  Bed Mobility      General bed mobility comments: pt was on toilet in BR upon arriving.    Transfers Overall transfer level: Needs  assistance Equipment used: Rolling walker (2 wheeled) (bariatric (green machine?)) Transfers: Sit to/from Stand Sit to Stand: Supervision         General transfer comment: pt was able to stand from bariatric [facility] that was placed over toilet without physical assistance. did require 2  attempts but was able to perform without physical assistance. demonstrates improving eccentric control with stand to sit  Ambulation/Gait Ambulation/Gait assistance: Supervision Gait Distance (Feet): 200 Feet Assistive device: Rolling walker (2 wheeled) (bariatric (green machine)) Gait Pattern/deviations: Wide base of support;Trunk flexed Gait velocity: decreased   General Gait Details: pt ambulated 200 ft without seated rest. did require 2 standing rest. on rm air throughout with sao2 92%    Balance Overall balance assessment: Needs assistance Sitting-balance support: Feet supported Sitting balance-[name]: Good     Standing balance support: Bilateral upper extremity supported;During functional activity Standing balance-[name]: Good     Cognition Arousal/Alertness: Awake/alert Behavior During Therapy: WFL for tasks assessed/performed Overall Cognitive Status: Within Functional Limits for tasks assessed      General Comments: flat affect             Pertinent Vitals/Pain Pain Assessment: No/denies pain Pain Score: 0-No pain     PT Goals (current goals can now be found in the care plan section) Acute Rehab PT Goals Patient Stated Goal: "Getting better and going home." Progress towards PT goals: Progressing toward goals    Frequency    Min 2X/week      PT Plan Current plan remains appropriate    Co-evaluation     PT goals addressed during session: Mobility/safety with mobility;Balance;Proper use of DME;Strengthening/ROM  AM-PAC PT "6 Clicks" Mobility   Outcome Measure  Help needed turning from your back to your side while in a flat bed without using  bedrails?: A Little Help needed moving from lying on your back to sitting on the side of a flat bed without using bedrails?: A Little Help needed moving to and from a bed to a chair (including a wheelchair)?: A Little Help needed standing up from a chair using your arms (e.g., wheelchair or bedside chair)?: A Little Help needed to walk in hospital room?: A Little Help needed climbing 3-5 steps with a railing? : A Lot 6 Click Score: 17    End of Session Equipment Utilized During Treatment: Gait belt Activity Tolerance: Patient tolerated treatment well Patient left: in chair;with call bell/phone within reach;with chair alarm set Nurse Communication: Mobility status PT Visit Diagnosis: Muscle weakness (generalized) (M62.81);Difficulty in walking, not elsewhere classified (R26.2)     Time: 1761-6073 PT Time Calculation (min) (ACUTE ONLY): 12 min  Charges:  $Gait Training: 8-22 mins                     Jetta Lout PTA 11/26/20, 3:00 PM

## 2020-11-27 LAB — BASIC METABOLIC PANEL
Anion gap: 6 (ref 5–15)
BUN: 17 mg/dL (ref 6–20)
CO2: 31 mmol/L (ref 22–32)
Calcium: 8.4 mg/dL — ABNORMAL LOW (ref 8.9–10.3)
Chloride: 100 mmol/L (ref 98–111)
Creatinine, Ser: 0.92 mg/dL (ref 0.61–1.24)
GFR, Estimated: >60 mL/min (ref >60)
Glucose, Bld: 105 mg/dL — ABNORMAL HIGH (ref 70–99)
Potassium: 3.8 mmol/L (ref 3.5–5.1)
Sodium: 137 mmol/L (ref 135–145)

## 2020-11-27 LAB — CBC
HCT: 36.1 % — ABNORMAL LOW (ref 39.0–52.0)
Hemoglobin: 11.1 g/dL — ABNORMAL LOW (ref 13.0–17.0)
MCH: 25.1 pg — ABNORMAL LOW (ref 26.0–34.0)
MCHC: 30.7 g/dL (ref 30.0–36.0)
MCV: 81.7 fL (ref 80.0–100.0)
Platelets: 378 10*3/uL (ref 150–400)
RBC: 4.42 MIL/uL (ref 4.22–5.81)
RDW: 14.5 % (ref 11.5–15.5)
WBC: 8.9 10*3/uL (ref 4.0–10.5)
nRBC: 0 % (ref 0.0–0.2)

## 2020-11-27 NOTE — Assessment & Plan Note (Signed)
Appears euvolemic.  Continue Lasix. 

## 2020-11-27 NOTE — Assessment & Plan Note (Signed)
Continue Zoloft 

## 2020-11-27 NOTE — Evaluation (Signed)
Occupational Therapy Re-Evaluation Patient Details Name: Christian [name] MRN: [id number] DOB: 05-Nov-1975 Today's Date: 11/27/2020   History of Present Illness Pt is a 45 y/o M who presented to ED on 10/09/20 with c/c of bilateral lower abdominal pain & c/o emesis twice that day after eating. Pt found to have abdominal wall purulent cellulitis. Pt admitted with clinical sepsis & AKI. PMH: morbid obesity, HTN. Pt with increased SOB starting 8/18 and CXR ordered with concern for free air. CT showed pneumoperitoneum suspicious for bowel perforation. Pt now s/p diagnostic laparoscopy converted to exploratory laparotomy, primary repair of cecal perforation and t/f to ICU on 8/19.   Clinical Impression   Christian [name] was seen for OT re-evaluation on this date 2/2 prolonged hospital stay and completion of goals. Upon arrival to room pt reclined in chair, agreeable to ADL session without use of AD. Pt requires SBA for toilet t/f - no AD used and no LOBs noted however slow movements with lateral sway. SUPERVISION tooth brushing standing sinkside. SUPERVISION + MOD multimodal cues + significantly increased time (~20 mins) to don underwear and don/doff B socks sitting EOC.   Goals updated this session. Pt continues to benefit from skilled OT services to maximize return to PLOF and minimize risk of future falls, injury, caregiver burden, and readmission. Will continue to follow POC. Discharge recommendation updated to [facility] to reflect pt progress.        Recommendations for follow up therapy are one component of a multi-disciplinary discharge planning process, led by the attending physician.  Recommendations may be updated based on patient status, additional functional criteria and insurance authorization.   Follow Up Recommendations  Home health OT;Supervision - Intermittent    Equipment Recommendations  3 in 1 bedside commode;Other (comment) (bariatric RW)    Recommendations for Other Services        Precautions / Restrictions Precautions Precautions: Fall Precaution Comments: abdominal surgical incision Restrictions Weight Bearing Restrictions: No      Mobility Bed Mobility               General bed mobility comments: received and left in chair    Transfers Overall transfer level: Needs assistance Equipment used: None Transfers: Sit to/from Stand Sit to Stand: Supervision         General transfer comment: SBA with no AD use    Balance Overall balance assessment: Needs assistance Sitting-balance support: Feet supported;No upper extremity supported Sitting balance-[name]: Good     Standing balance support: No upper extremity supported;During functional activity Standing balance-[name]: Good                             ADL either performed or assessed with clinical judgement   ADL Overall ADL's : Needs assistance/impaired                                       General ADL Comments: SBA for toilet t/f - no AD used and no LOBs noted however slow movements with lateral sway. SUPERVISION tooth brushing standing sinkside. SUPERVISION + MOD multimodal cues + significantly increased time to don underwear and don/doff B socks sitting EOC.      Pertinent Vitals/Pain Pain Assessment: No/denies pain     Hand Dominance     Extremity/Trunk Assessment Upper Extremity Assessment Upper Extremity Assessment: Overall WFL for tasks assessed  Lower Extremity Assessment Lower Extremity Assessment: Generalized weakness       Communication     Cognition Arousal/Alertness: Awake/alert Behavior During Therapy: WFL for tasks assessed/performed Overall Cognitive Status: Within Functional Limits for tasks assessed                                 General Comments: flat affect   General Comments       Exercises Exercises: Other exercises Other Exercises Other Exercises: Pt educated re; d/c recs, DME recs, adapted  dressing strategies, ECS Other Exercises: LBD, toileting, grooming, ~50 ft in room mobility, sit<>stand x3                   OT Goals(Current goals can be found in the care plan section) Acute Rehab OT Goals Patient Stated Goal: "Getting better and going home." OT Goal Formulation: With patient Time For Goal Achievement: 12/11/20 Potential to Achieve Goals: Good ADL Goals Pt Will Perform Lower Body Dressing: with modified independence;sit to/from stand Pt Will Transfer to Toilet: Independently;ambulating;regular height toilet Pt/caregiver will Perform Home Exercise Program: Both right and left upper extremity;With theraband;With written HEP provided;Independently;Increased strength  OT Frequency: Min 2X/week    AM-PAC OT "6 Clicks" Daily Activity     Outcome Measure Help from another person eating meals?: None Help from another person taking care of personal grooming?: A Little Help from another person toileting, which includes using toliet, bedpan, or urinal?: A Little Help from another person bathing (including washing, rinsing, drying)?: A Little Help from another person to put on and taking off regular upper body clothing?: A Little Help from another person to put on and taking off regular lower body clothing?: A Little 6 Click Score: 19   End of Session Nurse Communication: Mobility status  Activity Tolerance: Patient tolerated treatment well Patient left: in chair;with call bell/phone within reach  OT Visit Diagnosis: Unsteadiness on feet (R26.81);Muscle weakness (generalized) (M62.81)                Time: 9381-8299 OT Time Calculation (min): 40 min Charges:  OT General Charges $OT Visit: 1 Visit OT Evaluation $OT Re-eval: 1 Re-eval OT Treatments $Self Care/Home Management : 23-37 mins  Kathie Dike, M.S. OTR/L  11/27/20, 3:05 PM  ascom [phone]

## 2020-11-27 NOTE — TOC Progression Note (Signed)
Transition of Care [facility]) - Progression Note    Patient Details  Name: Christian [name] MRN: [id number] Date of Birth: Nov 20, 1975  [facility] CM/SW Contact  Liliana Cline, LCSW Phone Number: 11/27/2020, 3:00 PM  Clinical Narrative:   Attempted call to patient's sister to inform her of plan for DC Monday. No answer. Left a VM. Also sent secure email to [email or web address] with update.      Barriers to Discharge: Continued Medical Work up  Expected Discharge Plan and Services         Living arrangements for the past 2 months: Homeless                                       Social Determinants of Health (SDOH) Interventions    Readmission Risk Interventions Readmission Risk Prevention Plan 10/11/2020  Post Dischage Appt Complete  Medication Screening Complete  Transportation Screening Complete  Some recent data might be hidden

## 2020-11-27 NOTE — Hospital Course (Addendum)
Christian [name] is a 45 y.o. M with history HTN, morbid obesity and depression who presented with abdominal pain and vomiting and a loose stool.  CT on admission showed dilated distal small bowel and colon without transition point, as well as some diffuse retroperitoneal, mesenteric and inguinal lymphadenopathy.     8/12 Diagnosed with sepsis from abdominal wall cellulitis and admitted on antibiotics, Gen Surg consulted  8/13 - 8/17 Seemed to improve on antibiotics/fluids  8/18 Slow to recover, then on this day got tachypneic, tachycardic --> CXR showed free air under abdomen --> CT showed large free air, no clear site of perforation   8/19 Gen Surg took to OR for ex-lap and primary repair of cecal perforation  8/20 - 9/14 Recovering from surgery; BiPAP initiated post-op and continued nightly after that  9/15 CXR showed pleural effusion 9/19 US guided thoracentesis, 2.2L fluid 9/21 Echo unremarkable 9/29 Repeat CXR with atelectasis, no effusion 9/30-10/2 Trialed off BiPAP, ABG good, pCO2 normal

## 2020-11-27 NOTE — Progress Notes (Signed)
  Progress Note    Christian [name]   GGY:[id number]  DOB: [birth year]  DOA: 10/09/2020     47 Date of Service: 11/27/2020       Brief summary: Christian [name] is a 45 y.o. male with a history of morbid obesity, hypertension who presented with lower abdominal pain with evidence of abdominal wall cellulitis and also found to have lower extremity cellulitis.  He was treated with antibiotics with improvement.  During the hospitalization, he developed pneumoperitoneum with cecal perforation requiring surgical repair.  Patient was also found to be hypoxic and hypercapnic requiring BiPAP at night.           Subjective:  No headache, chest pain, dyspnea, swelling, abdominal pain, orthopnea, dyspnea on exertion.  Hospital Problems Chronic diastolic CHF (congestive heart failure) (HCC) Appears euvolemic - Continue Lasix  Obesity hypoventilation syndrome (HCC) Earlier during the hospital stay, the patient had had some clinically significant hypercarbia due to his OHS.  Given that he is clinically much better now, and has been clinically stable as an outpatient for a long time prior to admission without nocturnal CPAP or BiPAP, I wonder if he has recovered enough now that he no longer needs it. - Hold BiPAP for the next 2 nights - Check ABG in the morning and BMP  Depression - Continue Zoloft  Pleural effusion No evidence of hypoxia or reaccumulated pleural effusion -Plan to repeat chest x-ray tomorrow     Objective Vital signs were reviewed and unremarkable.  Vitals:   11/26/20 2240 11/27/20 0458 11/27/20 0823 11/27/20 1539  BP: 123/60 117/68 106/64 (!) 144/67  Pulse: 83 72 82 86  Resp: 20 18 18 18   Temp: 98.1 F (36.7 C) (!) 97.4 F (36.3 C) 98.8 F (37.1 C) 99.1 F (37.3 C)  TempSrc:  Oral Oral Oral  SpO2: 95% 99% 94% 96%  Weight:      Height:       (!) 204.6 kg   Exam General appearance: Obese adult male, sitting up in recliner, no acute distress, interactive,  watching television     HEENT: Mallampati 4.  No oral lesions. Skin:  Cardiac: RRR, no murmurs, no lower extremity edema Respiratory: Normal respiratory rate and rhythm, lungs clear without rales or wheezes Abdomen: Abdomen soft without tenderness palpation or guarding, no ascites or distention MSK:  Neuro: Awake and alert, extraocular movement intact, face symmetric, speech fluent. Psych: Affect blunted, judgment insight appear mildly impaired    Labs / Other Information My review of labs, imaging, notes and other tests is significant for Bicarb 31, electrolytes and renal function normal     Time spent: 25 minutes [facility] 11/27/2020, 5:16 PM

## 2020-11-27 NOTE — Assessment & Plan Note (Signed)
No evidence of hypoxia or reaccumulated pleural effusion -Plan to repeat chest x-ray tomorrow

## 2020-11-27 NOTE — Assessment & Plan Note (Signed)
Earlier during the hospital stay, the patient had had some clinically significant hypercarbia due to his OHS.  Given that he is clinically much better now, and has been clinically stable as an outpatient for a long time prior to admission without nocturnal CPAP or BiPAP, I wonder if he has recovered enough now that he no longer needs it. - Hold BiPAP for the next 2 nights - Check ABG in the morning and BMP

## 2020-11-27 NOTE — Progress Notes (Signed)
SLP Follow Up Note  Patient Details Name: Christian [name] MRN: [id number] DOB: [birth year]    It appears that pt's cognitive abilities have been functional throughout his hospitalization. He is calling and ordering his meals, follows oral directions well, has made progress in all POCs d/t functional cognitive abilities, he is using his call light appropriately and in a timely manner for toileting etc, his attention to task has been documented as appropriate.   Durene Dodge B. Dreama Saa M.S., CCC-SLP, [facility] Speech-Language Pathologist Rehabilitation Services Office [phone]   Alexzia Kasler Dreama Saa 11/27/2020, 2:42 PM

## 2020-11-27 NOTE — Progress Notes (Signed)
Mobility Specialist - Progress Note   11/27/20 1050  Mobility  Activity Ambulated in hall  Level of Assistance Standby assist, set-up cues, supervision of patient - no hands on  Assistive Device Front wheel walker  Distance Ambulated (ft) 200 ft  Mobility Ambulated with assistance in hallway  Mobility Response Tolerated well  Mobility performed by Mobility specialist  $Mobility charge 1 Mobility    Pre-mobility: 88 HR, 90% SpO2 During mobility: 113 HR, 88-90% SpO2 Post-mobility: 92% SpO2   Pt ambulated in hallway with RW. 1 standing rest break taken. O2 maintained 88-90% with activity on RA.    Filiberto Pinks Mobility Specialist 11/27/20, 10:56 AM

## 2020-11-27 NOTE — Progress Notes (Signed)
PT Cancellation Note  Patient Details Name: Ledford Goodson MRN: [id number] DOB: May 24, 1975   Cancelled Treatment:     Pt declined stating he had worked with Mobility Tech this am for ambulation and had just finished working with OT.   Jannet Askew 11/27/2020, 2:50 PM

## 2020-11-27 NOTE — Progress Notes (Signed)
Pt up in chair majority of shift and request that dressing change to be done later.

## 2020-11-27 NOTE — Progress Notes (Addendum)
Subjective:  CC: Christian [name] is a 45 y.o. male  Hospital stay day 48, 43 Days Post-Op ex-lap cecal perforation repair  HPI: No acute issues .  ROS:  General: Denies weight loss, weight gain, fatigue, fevers, chills, and night sweats. Heart: Denies chest pain, palpitations, racing heart, irregular heartbeat, leg pain or swelling, and decreased activity tolerance. Respiratory: Denies breathing difficulty, shortness of breath, wheezing, cough, and sputum. GI: Denies change in appetite, heartburn, nausea, vomiting, constipation, diarrhea, and blood in stool. GU: Denies difficulty urinating, pain with urinating, urgency, frequency, blood in urine.   Objective:   Temp:  [97.4 F (36.3 C)-98.8 F (37.1 C)] 98.8 F (37.1 C) (09/30 0823) Pulse Rate:  [72-83] 82 (09/30 0823) Resp:  [18-20] 18 (09/30 0823) BP: (103-123)/(60-70) 106/64 (09/30 0823) SpO2:  [94 %-99 %] 94 % (09/30 0823)     Height: 6\' 3"  (190.5 cm) Weight: (!) 204.6 kg BMI (Calculated): 55.55   Intake/Output this shift:   Intake/Output Summary (Last 24 hours) at 11/27/2020 1014 Last data filed at 11/26/2020 2245 Gross per 24 hour  Intake 840 ml  Output 250 ml  Net 590 ml    Constitutional :  alert, cooperative, appears stated age, and no distress  Respiratory:  clear to auscultation bilaterally  Cardiovascular:  regular rate and rhythm  Gastrointestinal: Soft, no guarding. Midline wound dressing with scant bleeding, serosanguinous discharge on packing.  Opening with healthy granulation tissue. 5cm long  x 1.5cm wide.  Approximately 1.5cm area in middle portion with deeper defect present, 1cm deep,  Tunneling still present cephalad but track is now much smaller with 37mm opening and unable to probe completely at bedside.  Skin: Cool and moist.   Psychiatric: Normal affect, non-agitated, not confused       LABS:  CMP Latest Ref Rng & Units 11/27/2020 11/25/2020 11/23/2020  Glucose 70 - 99 mg/dL 11/25/2020) 244(W) 102(V)   BUN 6 - 20 mg/dL 17 18 18   Creatinine 0.61 - 1.24 mg/dL 253(G 6.44  Sodium 135 - 145 mmol/L 137 139 138  Potassium 3.5 - 5.1 mmol/L 3.8 3.8 3.9  Chloride 98 - 111 mmol/L 100 100 100  CO2 22 - 32 mmol/L 31 31 32  Calcium 8.9 - 10.3 mg/dL 0.34) 8.3(L) 8.4(L)  Total Protein 6.5 - 8.1 g/dL - - -  Total Bilirubin 0.3 - 1.2 mg/dL - - -  Alkaline Phos 38 - 126 U/L - - -  AST 15 - 41 U/L - - -  ALT 0 - 44 U/L - - -   CBC Latest Ref Rng & Units 11/27/2020 11/18/2020 11/15/2020  WBC 4.0 - 10.5 K/uL 8.9 9.6 9.8  Hemoglobin 13.0 - 17.0 g/dL 11.1(L) 11.0(L) 12.0(L)  Hematocrit 39.0 - 52.0 % 36.1(L) 35.0(L) 37.9(L)  Platelets 150 - 400 K/uL 378 315 290    RADS: N/a Assessment:   S/p cecal perforation repair.  Now with old hematoma evacuation and open midline wound.  Wound continues to decrease in size. No acute concerns.   Continue wet-to-dry dressing changes.  One moist gauze to cover the entire area, then dry gauze, then tape to secure.  The wound has healed to the point where outpt monitoring is appropriate and minimal care is needed to maintain the wound.  Next wound check in four weeks.  Call with any additional questions or concerns

## 2020-11-28 ENCOUNTER — Inpatient Hospital Stay: Payer: Self-pay

## 2020-11-28 LAB — BLOOD GAS, ARTERIAL
Acid-Base Excess: 6.9 mmol/L — ABNORMAL HIGH (ref 0.0–2.0)
Bicarbonate: 31.3 mmol/L — ABNORMAL HIGH (ref 20.0–28.0)
FIO2: 0.21
O2 Saturation: 93.5 %
Patient temperature: 37
pCO2 arterial: 43 mmHg (ref 32.0–48.0)
pH, Arterial: 7.47 — ABNORMAL HIGH (ref 7.350–7.450)
pO2, Arterial: 64 mmHg — ABNORMAL LOW (ref 83.0–108.0)

## 2020-11-28 NOTE — Assessment & Plan Note (Signed)
Earlier during the hospital stay, the patient had had some clinically significant hypercarbia due to his OHS.  Given that he is clinically much better now, and has been clinically stable as an outpatient for a long time prior to admission without nocturnal CPAP or BiPAP, I wonder if he has recovered enough now that he no longer needs it.  ABG this morning with no BiPAP yesterday shows no hypercarbia.   - Hold BiPAP again tonight - Check ABG in the morning and BMP

## 2020-11-28 NOTE — Evaluation (Addendum)
Speech Language Pathology Evaluation Patient Details Name: Christian [name] MRN: [id number] DOB: 12/04/1975 Today's Date: 11/28/2020 Time: 1779-3903 SLP Time Calculation (min) (ACUTE ONLY): 45 min  Problem List:  Patient Active Problem List   Diagnosis Date Noted   Pleural effusion 11/25/2020   Chronic diastolic CHF (congestive heart failure) (HCC) 11/25/2020   Depression 11/25/2020   Obesity hypoventilation syndrome (HCC) 10/17/2020   Acute on chronic respiratory failure with hypoxia and hypercapnia (HCC)    Bowel perforation (HCC)    Hypotension    Acute respiratory failure with hypoxia (HCC)    Weakness    Cellulitis of abdominal wall 10/10/2020   Impaired fasting glucose    Severe sepsis (HCC) 10/09/2020   Hyperglycemia 10/09/2020   AKI (acute kidney injury) (HCC) 10/09/2020   Pressure ulcer of back 10/09/2020   Pressure ulcer of buttock 10/09/2020   Essential hypertension 10/09/2020   Chest pain 04/06/2011   Morbid obesity (HCC)    Elevated blood pressure reading without diagnosis of hypertension    Past Medical History:  Past Medical History:  Diagnosis Date   Allergic rhinitis    Elevated blood pressure reading without diagnosis of hypertension    History of chicken pox    Obesity    Past Surgical History:  Past Surgical History:  Procedure Laterality Date   TONSILLECTOMY AND ADENOIDECTOMY  1985   HPI:  Pt is a 45 y/o M who presented to ED on 10/09/20 with c/c of bilateral lower abdominal pain & c/o emesis twice that day after eating. Pt found to have abdominal wall purulent cellulitis. Pt admitted with clinical sepsis & AKI. PMH: Morbid Obesity, HTN. Pt with increased SOB starting 8/18 and CXR ordered with concern for free air. CT showed pneumoperitoneum suspicious for bowel perforation. Pt now s/p diagnostic laparoscopy converted to exploratory laparotomy, primary repair of cecal perforation. Patient was also found to be hypoxic and hypercapnic requiring BiPAP at  night.  Pt gave Social history to include being evicted from a [employer] in June and having to live in his car since then -- he stated he knew his health was "not good" and when he began having increased pain and could not manage, he called 911.  Pt endorsed a long-standing H/O ANXIETY, PANIC ATTACKS, AND DEPRESSION since High School into adulthood. He stated this impacted his ability to work and maintain employment as well as maintain living Independently and keeping his apartment. The ANXIETY and DEPRESSION became "worse" after the Deaths of his Parent (mother passed first; then Father passed in 2018). Pt stated his Church family "helped me a lot through all of this". He stated he is "not close to" his Sister who lives up [place] w/ her family.   Assessment / Plan / Recommendation Clinical Impression  Majd Tissue was seen today for consultation. He was sitting in his chair in the room watching TV. He was pleasant, verbally engaged this SLP; noted an apparent "nervous" laugh during conversation. Pragmatics and turn-taking adequate during engagement. He has no h/o nor was admitted for Neurological insult. Per chart notes, NSG staff report, and POCs w/ other disciplines, he appears to be meeting his goals and engages appropriately to make his wants/needs known in his current environment. He continues to make progress w/ PT/OT goals per notes; recommendation is for continued skilled OT/PT services to maximize return to PLOF and minimize risk of future falls, injury, caregiver burden, and readmission. Per NSG/CM notes, there was some mention that Cordarro may have been in "  EC classes during school and possibly having some other diagnosis". This information is unknown.     During interaction w/ [name], he engaged appropriately w/ this SLP. He seemed slightly nervous and exhibited a nervous "laugh" intermittently. He was slightly Tangential w/ details of certain topics but overall maintained attention during our  conversation. [name] endorsed a long-standing H/O ANXIETY, PANIC ATTACKS, AND DEPRESSION since High School into adulthood. He stated this impacted his ability to work and maintain employment as well as maintain living Independently and keeping his apartment. The ANXIETY and DEPRESSION became "worse" after the Deaths of his Parent (mother passed first; then Father passed in 2018). Pt stated his Church family "helped me a lot through all of this". He stated he is "not close to" his Sister who lives up [place] w/ her family. Strongly suspect pt's Baseline medical issues (dxs?) of ANXIETY, PANIC ATTACKS, and DEPRESSION have impact on his overall engagement and performance in ADLs, especially those ADLs including higher executive functioning. Pt stated he maintained his own Apartment and worked full-time prior to the Deaths of his Parents.   Suspect pt could benefit from further Psychiatric assessment to address his reports of ANXIETY, PANIC ATTACKS, AND DEPRESSION as he Discharges and returns to living in the community post this lengthy hospitalization. As pt has been demonstrating adequate and appropriate Cognitive-linguistic functioning w/ Staff and Therapy during general ADLs and his tx POCs while inpatient in the Hospital, he could benefit from Support and Supervision from Case Management and/or Community SW Services w/ more complex tasks such as finances, establishing housing, and obtaining employment -- support as he returns to managing his own affairs independently at Discharge. Recommend f/u w/ Neurology for further assessment and testing if indicated. No further skilled ST services for Cognitive-linguistic issues indicated currently nor at Discharge.     SLP Assessment  SLP Recommendation/Assessment: Patient does not need any further Speech Lanaguage Pathology Services SLP Visit Diagnosis: Cognitive communication deficit (R41.841)    Recommendations for follow up therapy are one component of a  multi-disciplinary discharge planning process, led by the attending physician.  Recommendations may be updated based on patient status, additional functional criteria and insurance authorization.    Follow Up Recommendations  None    Frequency and Duration  (n/a)   (n/a)      SLP Evaluation Cognition  Overall Cognitive Status: Within Functional Limits for tasks assessed Arousal/Alertness: Awake/alert Orientation Level: Oriented to person;Oriented to place;Oriented to time;Oriented to situation Year: 2022 Month:  (corrected to Oct 1st today) Attention: Focused;Sustained Focused Attention: Appears intact (turned tv off for optimal attention) Sustained Attention: Appears intact Awareness: Appears intact (of his needs in a timely manner) Problem Solving: Appears intact (calls for Staff w/ needs) Executive Function: Decision Making;Reasoning (are appropriate for his environment; and for general topics such as Head Injuries in football and need to monitor player concussions.) Reasoning: Appears intact Decision Making: Appears intact Behaviors:  (none) Safety/Judgment: Appears intact Comments: pt identified that he needed to call Staff for help w/ some ADLs; he agreed that he needed "some" Supervision w/ ADLs for assistance such as bathing tasks. He stated his health was "not as good as it used to be". [place] Scales of Cognitive Functioning:  (n/a)       Comprehension  Auditory Comprehension Overall Auditory Comprehension: Appears within functional limits for tasks assessed Visual Recognition/Discrimination Discrimination: Not tested Reading Comprehension Reading Status: Within funtional limits (menu items)    Expression Expression Primary Mode of Expression:  Verbal Verbal Expression Overall Verbal Expression: Appears within functional limits for tasks assessed Written Expression Written Expression: Not tested   Oral / Motor  Oral Motor/Sensory Function Overall Oral  Motor/Sensory Function: Within functional limits (during speech and eating/drinking) Motor Speech Overall Motor Speech:  (n/a)   GO                      Jerilynn Som, MS, CCC-SLP Speech Language Pathologist Rehab Services [phone] [facility] 11/28/2020, 2:49 PM

## 2020-11-28 NOTE — Progress Notes (Signed)
Physical Therapy Treatment Patient Details Name: Christian [name] MRN: [id number] DOB: 1975/12/28 Today's Date: 11/28/2020   History of Present Illness Pt is a 45 y/o M who presented to ED on 10/09/20 with c/c of bilateral lower abdominal pain & c/o emesis twice that day after eating. Pt found to have abdominal wall purulent cellulitis. Pt admitted with clinical sepsis & AKI. PMH: morbid obesity, HTN. Pt with increased SOB starting 8/18 and CXR ordered with concern for free air. CT showed pneumoperitoneum suspicious for bowel perforation. Pt now s/p diagnostic laparoscopy converted to exploratory laparotomy, primary repair of cecal perforation and t/f to ICU on 8/19.    PT Comments    Pt was sitting in recliner upon arriving. He agrees to session and is cooperative and pleasant throughout. On rm air throughout session with sao2 > 94%. HR did elevate to 122 bpm with gait training however pt ambulate 150 ft with RW (bariatric) prior to requiring standing rest. [place] pt continues to demonstrate improving activity tolerance while demonstrating improved overall safety with transfers and ADLs. He would greatly benefit from continued skilled therapy at DC to continue to progress pt to PLOF while maximizing independence.     Recommendations for follow up therapy are one component of a multi-disciplinary discharge planning process, led by the attending physician.  Recommendations may be updated based on patient status, additional functional criteria and insurance authorization.  Follow Up Recommendations  SNF;Supervision for mobility/OOB;Other (comment);Home health PT;Outpatient PT (Any post acute therapy will greatly help patient)     Equipment Recommendations  Rolling walker with 5" wheels;3in1 (PT);Other (comment) (Bariatric size)       Precautions / Restrictions Precautions Precautions: Fall Precaution Comments: abdominal surgical incision Restrictions Weight Bearing Restrictions: No      Mobility  Bed Mobility      General bed mobility comments: in recliner pre/post session    Transfers Overall transfer level: Modified independent Equipment used: Rolling walker (2 wheeled) (bariatric RW) Transfers: Sit to/from Stand Sit to Stand: Supervision         General transfer comment: no physical assistance required  Ambulation/Gait Ambulation/Gait assistance: Supervision Gait Distance (Feet): 200 Feet Assistive device: Rolling walker (2 wheeled) Gait Pattern/deviations: Wide base of support;Trunk flexed Gait velocity: decreased   General Gait Details: pt was able to ambulate one lap around hallway with only one standing rest. standing rest ~ 1 minute. sao2 94% and HR 118 bpm after ambulation 150 ft.    Balance Overall balance assessment: Needs assistance Sitting-balance support: Feet supported;No upper extremity supported Sitting balance-[name]: Good     Standing balance support: Bilateral upper extremity supported;During functional activity Standing balance-[name]: Good Standing balance comment: pt is reliant on UE support for safety in standing         Cognition Arousal/Alertness: Awake/alert Behavior During Therapy: WFL for tasks assessed/performed Overall Cognitive Status: Within Functional Limits for tasks assessed          General Comments: flat affect but cooperative and pleasant. Very appreciative this session             Pertinent Vitals/Pain Pain Assessment: No/denies pain     PT Goals (current goals can now be found in the care plan section) Acute Rehab PT Goals Patient Stated Goal: leave when I'm ready Progress towards PT goals: Progressing toward goals    Frequency    Min 2X/week      PT Plan Current plan remains appropriate    Co-evaluation     PT  goals addressed during session: Mobility/safety with mobility;Balance;Proper use of DME;Strengthening/ROM        AM-PAC PT "6 Clicks" Mobility   Outcome  Measure  Help needed turning from your back to your side while in a flat bed without using bedrails?: A Little Help needed moving from lying on your back to sitting on the side of a flat bed without using bedrails?: A Little Help needed moving to and from a bed to a chair (including a wheelchair)?: A Little Help needed standing up from a chair using your arms (e.g., wheelchair or bedside chair)?: A Little Help needed to walk in hospital room?: A Little Help needed climbing 3-5 steps with a railing? : A Little 6 Click Score: 18    End of Session   Activity Tolerance: Patient tolerated treatment well Patient left: in chair;with call bell/phone within reach;with chair alarm set Nurse Communication: Mobility status PT Visit Diagnosis: Muscle weakness (generalized) (M62.81);Difficulty in walking, not elsewhere classified (R26.2)     Time: 3329-5188 PT Time Calculation (min) (ACUTE ONLY): 13 min  Charges:  $Gait Training: 8-22 mins                     Jetta Lout PTA 11/28/20, 5:37 PM

## 2020-11-28 NOTE — Assessment & Plan Note (Signed)
Continue Zoloft 

## 2020-11-28 NOTE — Progress Notes (Signed)
Progress Note    Christian [name]   GUR:[id number]  DOB: 1975/11/29  DOA: 10/09/2020     48 Date of Service: 11/28/2020    Brief summary: Christian [name] is a 45 y.o. male with a history of morbid obesity, hypertension who presented with lower abdominal pain with evidence of abdominal wall cellulitis and also found to have lower extremity cellulitis.  He was treated with antibiotics with improvement.  During the hospitalization, he developed pneumoperitoneum with cecal perforation requiring surgical repair.  Patient was also found to be hypoxic and hypercapnic requiring BiPAP at night.        Subjective:  And difficulties sleeping overnight, no shortness of breath or confusion this morning.  No fever.  No new swelling, dyspnea on exertion.  Hospital Problems Chronic diastolic CHF (congestive heart failure) (HCC) Appears euvolemic - Continue Lasix  Obesity hypoventilation syndrome (HCC) Earlier during the hospital stay, the patient had had some clinically significant hypercarbia due to his OHS.  Given that he is clinically much better now, and has been clinically stable as an outpatient for a long time prior to admission without nocturnal CPAP or BiPAP, I wonder if he has recovered enough now that he no longer needs it.  ABG this morning with no BiPAP yesterday shows no hypercarbia.   - Hold BiPAP again tonight - Check ABG in the morning and BMP  Depression - Continue Zoloft  Pleural effusion No evidence of hypoxia or reaccumulated pleural effusion  CXR without effusion.  There is an opacity in the right lower lung, nonspecific, asymptomatic.     Objective Vital signs were reviewed and unremarkable.  Vitals:   11/27/20 1539 11/27/20 1950 11/28/20 0450 11/28/20 1519  BP: (!) 144/67 127/62 (!) 119/53 139/77  Pulse: 86 87 88 83  Resp: 18 20 20 18   Temp: 99.1 F (37.3 C) 97.7 F (36.5 C) 99.5 F (37.5 C) 98.5 F (36.9 C)  TempSrc: Oral Oral Oral Oral  SpO2: 96% 93%  95% 95%  Weight:      Height:       (!) 204.6 kg   Exam Physical Exam Constitutional:      General: He is not in acute distress.    Appearance: He is obese.  HENT:     Nose: No nasal deformity or rhinorrhea.     Mouth/Throat:     Lips: Pink. No lesions.     Mouth: Mucous membranes are moist. No oral lesions.     Dentition: Normal dentition.     Pharynx: Oropharynx is clear. No posterior oropharyngeal erythema.  Eyes:     General: Lids are normal. Gaze aligned appropriately.     Extraocular Movements: Extraocular movements intact.     Conjunctiva/sclera: Conjunctivae normal.  Cardiovascular:     Rate and Rhythm: Normal rate and regular rhythm.     Pulses:          Radial pulses are 2+ on the right side and 2+ on the left side.     Heart sounds: Normal heart sounds, S1 normal and S2 normal. No murmur heard. Pulmonary:     Effort: Pulmonary effort is normal. No respiratory distress.     Breath sounds: No wheezing or rales.  Abdominal:     General: There is no distension.     Palpations: Abdomen is soft.     Tenderness: There is no abdominal tenderness. There is no guarding or rebound.     Comments: No pain or tenderness or drainage around  his incision site.  Musculoskeletal:     Right lower leg: No edema.     Left lower leg: No edema.  Skin:    General: Skin is warm and dry.     Findings: No lesion or rash.  Neurological:     Mental Status: He is alert and oriented to person, place, and time.     Cranial Nerves: Cranial nerves are intact.     Motor: No weakness.  Psychiatric:        Mood and Affect: Mood normal.        Behavior: Behavior is cooperative.     Comments: Psychomotor responses are slowed.  His affect is blunted.        Labs / Other Information My review of labs, imaging, notes and other tests shows no new significant findings.     Time spent: 25 minutes [facility] 11/28/2020, 4:00 PM

## 2020-11-28 NOTE — Assessment & Plan Note (Signed)
No evidence of hypoxia or reaccumulated pleural effusion  CXR without effusion.  There is an opacity in the right lower lung, nonspecific, asymptomatic.

## 2020-11-28 NOTE — Assessment & Plan Note (Signed)
Appears euvolemic.  Continue Lasix. 

## 2020-11-29 LAB — BLOOD GAS, ARTERIAL
Acid-Base Excess: 10.4 mmol/L — ABNORMAL HIGH (ref 0.0–2.0)
Bicarbonate: 35.1 mmol/L — ABNORMAL HIGH (ref 20.0–28.0)
FIO2: 0.21
O2 Saturation: 95.1 %
Patient temperature: 37
pCO2 arterial: 46 mmHg (ref 32.0–48.0)
pH, Arterial: 7.49 — ABNORMAL HIGH (ref 7.350–7.450)
pO2, Arterial: 70 mmHg — ABNORMAL LOW (ref 83.0–108.0)

## 2020-11-29 MED ORDER — SERTRALINE HCL 50 MG PO TABS
50.0000 mg | ORAL_TABLET | Freq: Every day | ORAL | Status: DC
  Administered 2020-11-29 – 2020-11-30 (×2): 50 mg via ORAL
  Filled 2020-11-29 (×2): qty 1

## 2020-11-29 NOTE — Assessment & Plan Note (Signed)
No evidence of hypoxia or reaccumulated pleural effusion on CXR yesterday.  He has some atelectasis in that right lung still.  - IS and flutter

## 2020-11-29 NOTE — Assessment & Plan Note (Signed)
Appears euvolemic.  Continue Lasix. 

## 2020-11-29 NOTE — Assessment & Plan Note (Signed)
Earlier during the hospital stay, the patient had had some clinically significant hypercarbia due to his OHS.    While here, the patient was treated with nocturnal BiPAP, and efforts were made to find if the patient could obtain one after discharge.  However, given that he is clinically much better now, and has been clinically stable as an outpatient for a long time prior to admission without nocturnal CPAP or BiPAP, it was postulated that his need for NIPPV at night was because of his acute illness and he has recovered enough now that he no longer needs it.  ABG yesterday and again this morning with no BiPAP for last two nights shows stable pH (actually a little high), bicarb stable, pCO2 normal.    - Stop BiPAP

## 2020-11-29 NOTE — Assessment & Plan Note (Signed)
SLP evaluation yesterday highlighted that patient's lifelong depression, exacerbated by his parents' deaths in the last year, has been the major barrier to function for him - Continue Zoloft, titrate up dose

## 2020-11-29 NOTE — Progress Notes (Signed)
Progress Note    Christian [name]   KXF:[id number]  DOB: December 13, 1975  DOA: 10/09/2020     50 Date of Service: 11/29/2020    Brief summary: [name] is a 45 y.o. M with history HTN, morbid obesity and depression who presented with abdominal pain and vomiting and a loose stool.  CT on admission showed dilated distal small bowel and colon without transition point, as well as some diffuse retroperitoneal, mesenteric and inguinal lymphadenopathy.     8/12 Diagnosed with sepsis from abdominal wall cellulitis and admitted on antibiotics, Gen Surg consulted  8/13 - 8/17 Seemed to improve on antibiotics/fluids  8/18 Slow to recover, then on this day got tachypneic, tachycardic --> CXR showed free air under abdomen --> CT showed large free air, no clear site of perforation   8/19 Gen Surg took to OR for ex-lap and primary repair of cecal perforation  8/20 - 9/14 Recovering from surgery; BiPAP initiated post-op and continued nightly after that  9/15 CXR showed pleural effusion 9/19 US guided thoracentesis, 2.2L fluid 9/21 Echo unremarkable 9/29 Repeat CXR with atelectasis, no effusion 9/30-10/2 Trialed off BiPAP, ABG good, pCO2 normal         Subjective:  No chest pain, abdominal pain, swelling, dyspnea, confusion, sleepiness.  No cough, sputum, hemoptysis.  Some mild left-sided back pain, worse with inspiration.  Hospital Problems Chronic diastolic CHF (congestive heart failure) (HCC) Appears euvolemic - Continue Lasix  Obesity hypoventilation syndrome (HCC) Earlier during the hospital stay, the patient had had some clinically significant hypercarbia due to his OHS.    While here, the patient was treated with nocturnal BiPAP, and efforts were made to find if the patient could obtain one after discharge.  However, given that he is clinically much better now, and has been clinically stable as an outpatient for a long time prior to admission without nocturnal CPAP or BiPAP, it  was postulated that his need for NIPPV at night was because of his acute illness and he has recovered enough now that he no longer needs it.  ABG yesterday and again this morning with no BiPAP for last two nights shows stable pH (actually a little high), bicarb stable, pCO2 normal.    - Stop BiPAP   Depression SLP evaluation yesterday highlighted that patient's lifelong depression, exacerbated by his parents' deaths in the last year, has been the major barrier to function for him - Continue Zoloft, titrate up dose  Pleural effusion No evidence of hypoxia or reaccumulated pleural effusion on CXR yesterday.  He has some atelectasis in that right lung still.  - IS and flutter      Objective Vital signs were reviewed and unremarkable.  Vitals:   11/28/20 1519 11/28/20 2041 11/29/20 0551 11/29/20 0752  BP: 139/77 138/82 125/71 119/74  Pulse: 83 85 79 78  Resp: 18 20 20 20   Temp: 98.5 F (36.9 C) 98.3 F (36.8 C) 97.9 F (36.6 C) 98.2 F (36.8 C)  TempSrc: Oral   Oral  SpO2: 95% 96% 100% 98%  Weight:      Height:       (!) 204.6 kg   Exam General appearance: Obese adult male, sitting in recliner, no acute distress     HEENT: Anicteric, conjunctival pink, lids and lashes normal.  No nasal deformity, discharge, or epistaxis. Skin: His wound has no surrounding redness or pain or discharge.  There is a small dehiscence (packed, appears to be healing well.  No tenderness to palpation of  the abdomen. Cardiac: RRR, no murmurs, no lower extremity edema, JVP normal Respiratory: Respiratory rate and rhythm, lungs clear without rales or wheezes. Abdomen: Abdomen soft tenderness palpation or guarding, no ascites or distention MSK:  Neuro: Alert, extraocular movements intact, face symmetric, speech fluent, moves upper extremities with generalized weakness but symmetric strength Psych: Attention normal, affect blunted, judgment insight appear mildly impaired    Labs / Other  Information My review of labs, imaging, notes and other tests is significant for Normal ABG, normal PCO2, bicarb stable     Time spent: 25 minutes [facility] 11/29/2020, 2:21 PM

## 2020-11-30 ENCOUNTER — Other Ambulatory Visit: Payer: Self-pay

## 2020-11-30 LAB — CBC
HCT: 37.7 % — ABNORMAL LOW (ref 39.0–52.0)
Hemoglobin: 12 g/dL — ABNORMAL LOW (ref 13.0–17.0)
MCH: 25.5 pg — ABNORMAL LOW (ref 26.0–34.0)
MCHC: 31.8 g/dL (ref 30.0–36.0)
MCV: 80.2 fL (ref 80.0–100.0)
Platelets: 376 10*3/uL (ref 150–400)
RBC: 4.7 MIL/uL (ref 4.22–5.81)
RDW: 14.8 % (ref 11.5–15.5)
WBC: 8.5 10*3/uL (ref 4.0–10.5)
nRBC: 0 % (ref 0.0–0.2)

## 2020-11-30 LAB — BASIC METABOLIC PANEL
Anion gap: 8 (ref 5–15)
BUN: 18 mg/dL (ref 6–20)
CO2: 30 mmol/L (ref 22–32)
Calcium: 8.7 mg/dL — ABNORMAL LOW (ref 8.9–10.3)
Chloride: 100 mmol/L (ref 98–111)
Creatinine, Ser: 0.92 mg/dL (ref 0.61–1.24)
GFR, Estimated: >60 mL/min (ref >60)
Glucose, Bld: 109 mg/dL — ABNORMAL HIGH (ref 70–99)
Potassium: 3.8 mmol/L (ref 3.5–5.1)
Sodium: 138 mmol/L (ref 135–145)

## 2020-11-30 MED ORDER — FUROSEMIDE 40 MG PO TABS
40.0000 mg | ORAL_TABLET | Freq: Two times a day (BID) | ORAL | 11 refills | Status: DC
  Filled 2020-11-30: qty 60, 30d supply, fill #0

## 2020-11-30 MED ORDER — POLYSACCHARIDE IRON COMPLEX 150 MG PO CAPS
150.0000 mg | ORAL_CAPSULE | Freq: Every day | ORAL | 1 refills | Status: DC
  Filled 2020-11-30: qty 20, 20d supply, fill #0
  Filled 2020-12-22: qty 10, 10d supply, fill #1
  Filled 2020-12-30: qty 30, 30d supply, fill #2

## 2020-11-30 MED ORDER — SERTRALINE HCL 50 MG PO TABS
50.0000 mg | ORAL_TABLET | Freq: Every day | ORAL | 11 refills | Status: DC
  Filled 2020-11-30: qty 30, 30d supply, fill #0

## 2020-11-30 MED ORDER — POTASSIUM CHLORIDE CRYS ER 20 MEQ PO TBCR
20.0000 meq | EXTENDED_RELEASE_TABLET | Freq: Two times a day (BID) | ORAL | 11 refills | Status: DC
Start: 2020-11-30 — End: 2020-12-24
  Filled 2020-11-30: qty 60, 30d supply, fill #0

## 2020-11-30 NOTE — Plan of Care (Signed)
Patient discharge instructions including wound care, medication changes, and follow up appointment reviewed with patient and he was able to complete dressing change. Medications given to patient at discharge.  Wound supplies given and walker taken at discharge.  Patient did not have PIV at time of discharge.  Patient able to ambulate to get onto [facility] to take him to hotel.

## 2020-11-30 NOTE — TOC Transition Note (Signed)
Transition of Care [facility]) - CM/SW Discharge Note   Patient Details  Name: Christian [name] MRN: [id number] Date of Birth: Feb 05, 1976  [facility]) CM/SW Contact:  Chapman Fitch, RN Phone Number: 11/30/2020, 4:29 PM   Clinical Narrative:     Patient to discharge today His sister Christian [name] has wired $300 to the patients account. Patient states that he has a total of $650 in his account  Patient confirms that he will be going to the [facility] lodge in [place] at a rate of $385 a week continental breakfast available  He gave me permission to email his sister to let her know he would be discharging  He states he has notified his pastor that he will be discharging  DC medications to be delivered to bedside from Medication Management  Bariatric Charity RW to be delivered prior to discharge  Bedside RN to provide dressing change instructions prior to discharge and provided dressing change supplies  I provided him to change of clothes Patient has application to [facility] to follow up at discharge  Patient does not require BIPAP or O2 at discharge, Patient will not have home health services at discharge  Patient states that while he is at the hotel he will working on getting his car insured again, and plates  MD and TOC leadership in agreement to discharge plan     Final next level of care: Other (comment) [facility]) Barriers to Discharge: Barriers Resolved   Patient Goals and CMS Choice Patient states their goals for this hospitalization and ongoing recovery are:: TBD CMS Medicare.gov Compare Post Acute Care list provided to:: Patient Choice offered to / list presented to : Patient  Discharge Placement                       Discharge Plan and Services                DME Arranged: Dan Humphreys DME Agency: [employer] Date DME Agency Contacted: 11/30/20   Representative spoke with at DME Agency: Feliz Beam            Social Determinants of Health  (SDOH) Interventions     Readmission Risk Interventions Readmission Risk Prevention Plan 10/11/2020  Post Dischage Appt Complete  Medication Screening Complete  Transportation Screening Complete  Some recent data might be hidden

## 2020-11-30 NOTE — Progress Notes (Signed)
Occupational Therapy Treatment Patient Details Name: Rickard Kennerly MRN: [id number] DOB: [birth year] Today's Date: 11/30/2020   History of present illness Pt is a 45 y/o M who presented to ED on 10/09/20 with c/c of bilateral lower abdominal pain & c/o emesis twice that day after eating. Pt found to have abdominal wall purulent cellulitis. Pt admitted with clinical sepsis & AKI. PMH: morbid obesity, HTN. Pt with increased SOB starting 8/18 and CXR ordered with concern for free air. CT showed pneumoperitoneum suspicious for bowel perforation. Pt now s/p diagnostic laparoscopy converted to exploratory laparotomy, primary repair of cecal perforation and t/f to ICU on 8/19.   OT comments  Mr Belay was seen for OT treatment on this date. Upon arrival to room pt seated in chair, agreeable to session. No AD used this session. PT requires SBA to transport x3 handheld items across room - no LOBs noted. SUPERVISION tooth brushing standing sinkside. Pt return verbalized plan for HEP upon d/c. Pt making good progress toward goals. Pt continues to benefit from skilled OT services to maximize return to PLOF and minimize risk of future falls, injury, caregiver burden, and readmission. Will continue to follow POC. Discharge recommendation remains appropriate.     Recommendations for follow up therapy are one component of a multi-disciplinary discharge planning process, led by the attending physician.  Recommendations may be updated based on patient status, additional functional criteria and insurance authorization.    Follow Up Recommendations  Home health OT;Supervision - Intermittent    Equipment Recommendations  3 in 1 bedside commode    Recommendations for Other Services      Precautions / Restrictions Precautions Precautions: Fall Precaution Comments: abdominal surgical incision Restrictions Weight Bearing Restrictions: No       Mobility Bed Mobility               General bed mobility  comments: received and left in chair    Transfers Overall transfer level: Modified independent Equipment used: None Transfers: Sit to/from Stand Sit to Stand: Modified independent (Device/Increase time) Stand pivot transfers: Modified independent (Device/Increase time)       General transfer comment: use of arm rests    Balance Overall balance assessment: Needs assistance Sitting-balance support: Feet supported;No upper extremity supported Sitting balance-[name]: Normal Sitting balance - Comments: steady sitting reaching outside BOS   Standing balance support: No upper extremity supported;During functional activity Standing balance-[name]: Good                           ADL either performed or assessed with clinical judgement   ADL Overall ADL's : Needs assistance/impaired                                       General ADL Comments: SBA transport x3 handheld items across room- no AD used and no LOBs noted. SUPERVISION tooth brushing standing sinkside.      Cognition Arousal/Alertness: Awake/alert Behavior During Therapy: WFL for tasks assessed/performed Overall Cognitive Status: Within Functional Limits for tasks assessed                                 General Comments: Pleasant and cooperative.        Exercises Exercises: Other exercises Other Exercises Other Exercises: Pt educated re; d/c recs, DME recs,  HEP, ECS Other Exercises: IADL setup, grooming, ~60 ft in room mobility      General Comments SpO2 96% on RA, HR 123    Pertinent Vitals/ Pain       Pain Assessment: No/denies pain Pain Intervention(s): Limited activity within patient's tolerance;Monitored during session;Repositioned   Frequency  Min 2X/week        Progress Toward Goals  OT Goals(current goals can now be found in the care plan section)  Progress towards OT goals: Progressing toward goals  Acute Rehab OT Goals Patient Stated  Goal: to discharge from hospital OT Goal Formulation: With patient Time For Goal Achievement: 12/11/20 Potential to Achieve Goals: Good ADL Goals Pt Will Perform Upper Body Dressing: with supervision;standing Pt Will Perform Lower Body Dressing: with modified independence;sit to/from stand Pt Will Transfer to Toilet: Independently;ambulating;regular height toilet Pt/caregiver will Perform Home Exercise Program: Both right and left upper extremity;With theraband;With written HEP provided;Independently;Increased strength  Plan Discharge plan remains appropriate;Frequency remains appropriate    Co-evaluation                 AM-PAC OT "6 Clicks" Daily Activity     Outcome Measure   Help from another person eating meals?: None Help from another person taking care of personal grooming?: A Little Help from another person toileting, which includes using toliet, bedpan, or urinal?: A Little Help from another person bathing (including washing, rinsing, drying)?: A Little Help from another person to put on and taking off regular upper body clothing?: A Little Help from another person to put on and taking off regular lower body clothing?: A Little 6 Click Score: 19    End of Session    OT Visit Diagnosis: Unsteadiness on feet (R26.81);Muscle weakness (generalized) (M62.81)   Activity Tolerance Patient tolerated treatment well   Patient Left in chair;with call bell/phone within reach   Nurse Communication Mobility status        Time: 1152-1205 OT Time Calculation (min): 13 min  Charges: OT General Charges $OT Visit: 1 Visit OT Treatments $Self Care/Home Management : 8-22 mins  Kathie Dike, M.S. OTR/L  11/30/20, 1:30 PM  ascom [phone]

## 2020-11-30 NOTE — Discharge Summary (Signed)
Physician Discharge Summary   Patient name: Christian [name]  Admit date:     10/09/2020  Discharge date: 11/30/2020  Attending Physician: Alford Highland [[id number]]  Discharge Physician: Alberteen Sam   PCP: Pcp, No    Recommendations for follow up: Follow up with General Surgery as directed Follow up with new PCP in 1-2 weeks New PCP: Please titrate up sertraline and refer to counseling if available          Follow-up Information     Sakai, Isami, DO Follow up in 1 week(s).   Specialty: Surgery Why: for staple and drain removal Contact information: [address] [place] [address] [phone]         [facility]. Schedule an appointment as soon as possible for a visit in 1 week(s).   Specialty: Primary Care Why: For primary care Contact information: [address] [place] [address] [phone]                  Primary discharge diagnosis Severe sepsis due to perforated cecum    Discharge Diagnoses   Morbid obesity (HCC)   Essential hypertension   Impaired fasting glucose   Chronic diastolic CHF (congestive heart failure) (HCC)   Obesity hypoventilation syndrome (HCC)   Depression   Resolved Diagnoses   AKI (acute kidney injury) (HCC)   Pressure ulcer of back   Pressure ulcer of buttock   Cellulitis of abdominal wall   Acute respiratory failure with hypoxia and hypercapnia (HCC)   Pleural effusion       Hospital Course   Christian [name] is a 45 y.o. M with history HTN, morbid obesity and depression who presented with abdominal pain and vomiting and a loose stool.  CT on admission showed dilated distal small bowel and colon without transition point, as well as some diffuse retroperitoneal, mesenteric and inguinal lymphadenopathy.     8/12 Diagnosed with sepsis from abdominal wall cellulitis and admitted on antibiotics, Gen Surg consulted  8/13 - 8/17 Seemed to improve on  antibiotics/fluids  8/18 Slow to recover, then on this day got tachypneic, tachycardic --> CXR showed free air under abdomen --> CT showed large free air, no clear site of perforation   8/19 Gen Surg took to OR for ex-lap and primary repair of cecal perforation  8/20 - 9/14 Recovering from surgery; BiPAP initiated post-op and continued nightly after that  9/15 CXR showed pleural effusion 9/19 US guided thoracentesis, 2.2L fluid 9/21 Echo unremarkable 9/29 Repeat CXR with atelectasis, no effusion 9/30-10/2 Trialed off BiPAP, ABG good, pCO2 normal            Condition at discharge: fair  Exam Physical Exam Vitals reviewed.  Constitutional:      General: He is not in acute distress.    Appearance: He is obese. He is not ill-appearing or toxic-appearing.  HENT:     Head: Normocephalic and atraumatic.  Cardiovascular:     Rate and Rhythm: Normal rate and regular rhythm.     Heart sounds: No murmur heard.   No gallop.  Pulmonary:     Effort: Pulmonary effort is normal.     Breath sounds: Normal breath sounds. No wheezing or rales.  Musculoskeletal:     Right lower leg: No swelling.     Left lower leg: No swelling.  Skin:    General: Skin is warm and dry.     Findings: No rash.  Neurological:  General: No focal deficit present.     Mental Status: He is alert and oriented to person, place, and time.  Psychiatric:        Mood and Affect: Mood normal.        Behavior: Behavior normal.        Thought Content: Thought content normal.      Disposition:  Hotel  Discharge time: greater than 30 minutes. Allergies as of 11/30/2020   No Known Allergies      Medication List     STOP taking these medications    amLODipine 5 MG tablet Commonly known as: NORVASC   naproxen 500 MG tablet Commonly known as: NAPROSYN       TAKE these medications    Ferrex 150 150 MG capsule Generic drug: iron polysaccharides Take 1 capsule (150 mg total) by mouth once  daily. Start taking on: December 01, 2020   furosemide 40 MG tablet Commonly known as: LASIX Take 1 tablet (40 mg total) by mouth 2 (two) times daily.   potassium chloride SA 20 MEQ tablet Commonly known as: KLOR-CON Take 1 tablet (20 mEq total) by mouth 2 (two) times daily.   sertraline 50 MG tablet Commonly known as: ZOLOFT Take 1 tablet (50 mg total) by mouth once daily. Start taking on: December 01, 2020               Durable Medical Equipment  (From admission, onward)           Start     Ordered   11/30/20 1753  For home use only DME Walker rolling  Once       Comments: Bariatric walker  Question Answer Comment  Walker: With 5 Inch Wheels   Patient needs a walker to treat with the following condition Perforation of cecum      11/30/20 1752              Discharge Care Instructions  (From admission, onward)           Start     Ordered   11/30/20 0000  Discharge wound care:       Comments: Clean the wound with warm soapy water and pat dry Pack the hold with moist gauze and top with gauze as the nurse showed you. Cover the an ABD pad and dry gauze. Remove ALL the gauze each time you change the dressing. Change once or twice daily and as needed for extra drainage.  Go see Dr. Tonna Boehringer in the office   11/30/20 1521            DG Chest 1 View  Result Date: 11/16/2020 CLINICAL DATA:  Status post thoracentesis EXAM: CHEST  1 VIEW COMPARISON:  Chest radiograph 11/16/2020 FINDINGS: The cardiomediastinal silhouette is stable, allowing for rightward patient rotation. The right pleural effusion has significantly decreased in size following thoracentesis. There is no evidence of pneumothorax. Aeration at the left lung is unchanged. There is no left pleural effusion or pneumothorax. The bones are stable. IMPRESSION: Significant interval decrease in size of the right pleural effusion following thoracentesis. No evidence of pneumothorax. Electronically Signed    By: Lesia Hausen M.D.   On: 11/16/2020 16:18   DG Chest 2 View  Result Date: 11/28/2020 CLINICAL DATA:  Hypoxia.  Recent pneumoperitoneum. EXAM: CHEST - 2 VIEW COMPARISON:  November 16, 2020 FINDINGS: Mild opacity in the right base. No pneumothorax. The lungs are otherwise clear. The cardiomediastinal silhouette is stable. No other acute abnormalities.  IMPRESSION: Mild opacity in the right base may represent pneumonia or atelectasis. Recommend attention on follow-up. Electronically Signed   By: Gerome Sam III M.D.   On: 11/28/2020 11:48   DG Chest Port 1 View  Result Date: 11/16/2020 CLINICAL DATA:  Pleural effusion EXAM: PORTABLE CHEST 1 VIEW COMPARISON:  10/15/2020 FINDINGS: Cardiac enlargement with central pulmonary vascular congestion. Right pleural effusion with basilar atelectasis or infiltration. This may indicate pneumonia. No pneumothorax. Mediastinal contours appear intact. IMPRESSION: Cardiac enlargement with central vascular congestion. Right pleural effusion with infiltration or atelectasis in the right base. Electronically Signed   By: Burman Nieves M.D.   On: 11/16/2020 04:01   DG Chest Port 1 View  Result Date: 11/12/2020 CLINICAL DATA:  Shortness of breath EXAM: PORTABLE CHEST 1 VIEW COMPARISON:  10/09/2020 FINDINGS: Cardiomegaly with vascular congestion. Asymmetric hazy opacity right thorax suspected to be secondary to pleural effusion. No pneumothorax is seen. IMPRESSION: Cardiomegaly with vascular congestion. Asymmetric hazy right thorax opacity, suspect layering pleural effusion Electronically Signed   By: Jasmine Pang M.D.   On: 11/12/2020 21:20   ECHOCARDIOGRAM COMPLETE  Result Date: 11/18/2020    ECHOCARDIOGRAM REPORT   Patient Name:   LAIKEN NOHR Sandefer Date of Exam: 11/18/2020 Medical Rec #:  [id number]         Height:       75.0 in Accession #:    [id number]        Weight:       444.4 lb Date of Birth:  1975-10-20         BSA:          3.081 m Patient Age:    45  years          BP:           111/69 mmHg Patient Gender: M                 HR:           80 bpm. Exam Location:  [facility] Procedure: 2D Echo, Cardiac Doppler and Color Doppler Indications:     Pulmonary hypertension I27.2  History:         Patient has no prior history of Echocardiogram examinations.                  Signs/Symptoms:Chest Pain; Risk Factors:Hypertension.  Sonographer:     Cristela Blue Referring Phys:  [id number] [facility] Diagnosing Phys: Debbe Odea MD  Sonographer Comments: No apical window, no subcostal window and Technically difficult study due to poor echo windows. IMPRESSIONS  1. Left ventricular ejection fraction, by estimation, is 55 to 60%. The left ventricle has normal function. Left ventricular endocardial border not optimally defined to evaluate regional wall motion. There is mild left ventricular hypertrophy. Left ventricular diastolic function could not be evaluated.  2. Right ventricular systolic function is normal. The right ventricular size is not well visualized.  3. The mitral valve is grossly normal. No evidence of mitral valve regurgitation.  4. The aortic valve was not well visualized. Aortic valve regurgitation is not visualized. FINDINGS  Left Ventricle: Left ventricular ejection fraction, by estimation, is 55 to 60%. The left ventricle has normal function. Left ventricular endocardial border not optimally defined to evaluate regional wall motion. The left ventricular internal cavity size was normal in size. There is mild left ventricular hypertrophy. Left ventricular diastolic function could not be evaluated. Right Ventricle: The right ventricular size is not well visualized. No increase in right  ventricular wall thickness. Right ventricular systolic function is normal. Left Atrium: Left atrial size was not well visualized. Right Atrium: Right atrial size was not well visualized. Pericardium: Trivial pericardial effusion is present. Mitral Valve: The mitral valve is grossly  normal. No evidence of mitral valve regurgitation. Tricuspid Valve: The tricuspid valve is not well visualized. Tricuspid valve regurgitation is not demonstrated. Aortic Valve: The aortic valve was not well visualized. Aortic valve regurgitation is not visualized. Pulmonic Valve: The pulmonic valve was not well visualized. Pulmonic valve regurgitation is not visualized. Aorta: The aortic root was not well visualized. Venous: The inferior vena cava was not well visualized. IAS/Shunts: The interatrial septum was not well visualized.  LEFT VENTRICLE PLAX 2D LVIDd:         4.50 cm LVIDs:         3.20 cm LV PW:         1.40 cm LV IVS:        1.80 cm LVOT diam:     2.10 cm LVOT Area:     3.46 cm  LEFT ATRIUM         Index LA diam:    5.00 cm 1.62 cm/m   AORTA Ao Root diam: 3.40 cm  SHUNTS Systemic Diam: 2.10 cm Debbe Odea MD Electronically signed by Debbe Odea MD Signature Date/Time: 11/18/2020/3:43:21 PM    Final    US THORACENTESIS ASP PLEURAL SPACE W/IMG GUIDE  Result Date: 11/16/2020 INDICATION: Patient with a history of respiratory failure presents today with a right pleural effusion. Interventional radiology asked to perform a diagnostic and therapeutic thoracentesis. EXAM: ULTRASOUND GUIDED THORACENTESIS MEDICATIONS: 1% lidocaine 10 mL COMPLICATIONS: None immediate. PROCEDURE: An ultrasound guided thoracentesis was thoroughly discussed with the patient and questions answered. The benefits, risks, alternatives and complications were also discussed. The patient understands and wishes to proceed with the procedure. Written consent was obtained. Ultrasound was performed to localize and mark an adequate pocket of fluid in the right chest. The area was then prepped and draped in the normal sterile fashion. 1% Lidocaine was used for local anesthesia. Under ultrasound guidance a 6 Fr Safe-T-Centesis catheter was introduced. Thoracentesis was performed. The catheter was removed and a dressing applied.  FINDINGS: A total of approximately 2.2 L of amber-colored fluid was removed. Samples were sent to the laboratory as requested by the clinical team. IMPRESSION: Successful ultrasound guided right thoracentesis yielding 2.2 L of pleural fluid. Read by: Alwyn Ren, NP Electronically Signed   By: Simonne Come M.D.   On: 11/16/2020 16:10   Results for orders placed or performed during the hospital encounter of 10/09/20  C Difficile Quick Screen w PCR reflex     Status: None   Collection Time: 10/09/20  7:47 AM   Specimen: STOOL  Result Value Ref Range Status   C Diff antigen NEGATIVE NEGATIVE Final   C Diff toxin NEGATIVE NEGATIVE Final   C Diff interpretation No C. difficile detected.  Final    Comment: Performed at [facility], [address] [place], [place] [address]  Gastrointestinal Panel by PCR , Stool     Status: None   Collection Time: 10/09/20  7:47 AM   Specimen: STOOL  Result Value Ref Range Status   Campylobacter species NOT DETECTED NOT DETECTED Final   Plesimonas shigelloides NOT DETECTED NOT DETECTED Final   Salmonella species NOT DETECTED NOT DETECTED Final   Yersinia enterocolitica NOT DETECTED NOT DETECTED Final   Vibrio species NOT DETECTED NOT DETECTED  Final   Vibrio cholerae NOT DETECTED NOT DETECTED Final   Enteroaggregative E coli (EAEC) NOT DETECTED NOT DETECTED Final   Enteropathogenic E coli (EPEC) NOT DETECTED NOT DETECTED Final   Enterotoxigenic E coli (ETEC) NOT DETECTED NOT DETECTED Final   Shiga like toxin producing E coli (STEC) NOT DETECTED NOT DETECTED Final   Shigella/Enteroinvasive E coli (EIEC) NOT DETECTED NOT DETECTED Final   Cryptosporidium NOT DETECTED NOT DETECTED Final   Cyclospora cayetanensis NOT DETECTED NOT DETECTED Final   Entamoeba histolytica NOT DETECTED NOT DETECTED Final   Giardia lamblia NOT DETECTED NOT DETECTED Final   Adenovirus F40/41 NOT DETECTED NOT DETECTED Final   Astrovirus NOT DETECTED NOT DETECTED Final    Norovirus GI/GII NOT DETECTED NOT DETECTED Final   Rotavirus A NOT DETECTED NOT DETECTED Final   Sapovirus (I, II, IV, and V) NOT DETECTED NOT DETECTED Final    Comment: Performed at [facility], [address] [place], [place] [address]  Resp Panel by RT-PCR (Flu A&B, Covid) Nasopharyngeal Swab     Status: None   Collection Time: 10/09/20  9:37 AM   Specimen: Nasopharyngeal Swab; Nasopharyngeal(NP) swabs in vial transport medium  Result Value Ref Range Status   SARS Coronavirus 2 by RT PCR NEGATIVE NEGATIVE Final    Comment: (NOTE) SARS-CoV-2 target nucleic acids are NOT DETECTED.  The SARS-CoV-2 RNA is generally detectable in upper respiratory specimens during the acute phase of infection. The lowest concentration of SARS-CoV-2 viral copies this assay can detect is 138 copies/mL. A negative result does not preclude SARS-Cov-2 infection and should not be used as the sole basis for treatment or other patient management decisions. A negative result may occur with  improper specimen collection/handling, submission of specimen other than nasopharyngeal swab, presence of viral mutation(s) within the areas targeted by this assay, and inadequate number of viral copies(<138 copies/mL). A negative result must be combined with clinical observations, patient history, and epidemiological information. The expected result is Negative.  Fact Sheet for Patients:  BloggerCourse.com  Fact Sheet for Healthcare Providers:  SeriousBroker.it  This test is no t yet approved or cleared by the [place] FDA and  has been authorized for detection and/or diagnosis of SARS-CoV-2 by FDA under an Emergency Use Authorization (EUA). This EUA will remain  in effect (meaning this test can be used) for the duration of the COVID-19 declaration under Section 564(b)(1) of the Act, 21 U.S.C.section 360bbb-3(b)(1), unless the authorization is terminated   or revoked sooner.       Influenza A by PCR NEGATIVE NEGATIVE Final   Influenza B by PCR NEGATIVE NEGATIVE Final    Comment: (NOTE) The Xpert Xpress SARS-CoV-2/FLU/RSV plus assay is intended as an aid in the diagnosis of influenza from Nasopharyngeal swab specimens and should not be used as a sole basis for treatment. Nasal washings and aspirates are unacceptable for Xpert Xpress SARS-CoV-2/FLU/RSV testing.  Fact Sheet for Patients: BloggerCourse.com  Fact Sheet for Healthcare Providers: SeriousBroker.it  This test is not yet approved or cleared by the [place] FDA and has been authorized for detection and/or diagnosis of SARS-CoV-2 by FDA under an Emergency Use Authorization (EUA). This EUA will remain in effect (meaning this test can be used) for the duration of the COVID-19 declaration under Section 564(b)(1) of the Act, 21 U.S.C. section 360bbb-3(b)(1), unless the authorization is terminated or revoked.  Performed at [facility], [address] [place], [place] [address]   CULTURE, BLOOD (ROUTINE X 2) w  Reflex to ID Panel     Status: None   Collection Time: 10/09/20  4:44 PM   Specimen: BLOOD  Result Value Ref Range Status   Specimen Description BLOOD BLOOD LEFT HAND  Final   Special Requests   Final    BOTTLES DRAWN AEROBIC AND ANAEROBIC Blood Culture adequate volume   Culture   Final    NO GROWTH 5 DAYS Performed at [facility], [address] [place], [place] [address]    Report Status 10/14/2020 FINAL  Final  CULTURE, BLOOD (ROUTINE X 2) w Reflex to ID Panel     Status: None   Collection Time: 10/09/20  4:44 PM   Specimen: BLOOD  Result Value Ref Range Status   Specimen Description BLOOD LEFT ANTECUBITAL  Final   Special Requests   Final    BOTTLES DRAWN AEROBIC ONLY Blood Culture results may not be optimal due to an inadequate volume of blood received in culture bottles   Culture    Final    NO GROWTH 5 DAYS Performed at [facility], [address] [place], [place] [address]    Report Status 10/14/2020 FINAL  Final  MRSA Next Gen by PCR, Nasal     Status: None   Collection Time: 10/12/20 12:40 PM   Specimen: Nasal Mucosa; Nasal Swab  Result Value Ref Range Status   MRSA by PCR Next Gen NOT DETECTED NOT DETECTED Final    Comment: (NOTE) The GeneXpert MRSA Assay (FDA approved for NASAL specimens only), is one component of a comprehensive MRSA colonization surveillance program. It is not intended to diagnose MRSA infection nor to guide or monitor treatment for MRSA infections. Test performance is not FDA approved in patients less than 34 years old. Performed at [facility], [address] [place], [place] [address]   Aerobic/Anaerobic Culture w Gram Stain (surgical/deep wound)     Status: None   Collection Time: 10/15/20 11:36 PM   Specimen: PATH Other; Wound  Result Value Ref Range Status   Specimen Description   Final    PERITONEAL Performed at [facility], [address] [place], [place] [address]    Special Requests   Final    NONE Performed at [facility], [address] [place], [place] [address]    Gram Stain   Final    MODERATE WBC PRESENT, PREDOMINANTLY MONONUCLEAR NO ORGANISMS SEEN    Culture   Final    No growth aerobically or anaerobically. Performed at [facility], [address] [place], [place] [address]    Report Status 10/21/2020 FINAL  Final  MRSA Next Gen by PCR, Nasal     Status: None   Collection Time: 10/16/20  4:50 AM   Specimen: Nasal Mucosa; Nasal Swab  Result Value Ref Range Status   MRSA by PCR Next Gen NOT DETECTED NOT DETECTED Final    Comment: (NOTE) The GeneXpert MRSA Assay (FDA approved for NASAL specimens only), is one component of a comprehensive MRSA colonization surveillance program. It is not intended to diagnose MRSA infection nor to guide or monitor  treatment for MRSA infections. Test performance is not FDA approved in patients less than 43 years old. Performed at [facility], [address] [place], [place] [address]   Body fluid culture w Gram Stain     Status: None   Collection Time: 11/16/20  3:51 PM   Specimen: PATH Cytology Pleural fluid  Result Value Ref Range Status  Specimen Description   Final    PLEURAL Performed at [facility], [address] [place], [place] [address]    Special Requests   Final    NONE Performed at [facility], [address] [place], [place] [address]    Gram Stain   Final    MODERATE WBC PRESENT, PREDOMINANTLY MONONUCLEAR NO ORGANISMS SEEN    Culture   Final    NO GROWTH Performed at [facility], [address], [place], [place] [address]    Report Status 11/20/2020 FINAL  Final    Signed:  Alberteen Sam MD.  [facility] 11/30/2020, 8:24 PM

## 2020-11-30 NOTE — Evaluation (Signed)
Physical Therapy Re-Evaluation Patient Details Name: Christian [name] MRN: [id number] DOB: Jan 03, 1976 Today's Date: 11/30/2020  History of Present Illness  Pt is a 45 y/o M who presented to ED on 10/09/20 with c/c of bilateral lower abdominal pain & c/o emesis twice that day after eating. Pt found to have abdominal wall purulent cellulitis. Pt admitted with clinical sepsis & AKI. PMH: morbid obesity, HTN. Pt with increased SOB starting 8/18 and CXR ordered with concern for free air. CT showed pneumoperitoneum suspicious for bowel perforation. Pt now s/p diagnostic laparoscopy converted to exploratory laparotomy, primary repair of cecal perforation and t/f to ICU on 8/19.  Clinical Impression  PT re-evaluation performed d/t pt's extended hospitalization.  Currently pt is modified independent with transfers and SBA ambulating 200 feet with bariatric RW use (no standing rest break required today during ambulation).  Pt would benefit from skilled PT to address noted impairments and functional limitations (see below for any additional details).  Upon hospital discharge, pt would benefit from continued therapy to improve overall strength, activity tolerance, balance, and progressive independence with functional mobility.  PT POC reviewed and updated.       Recommendations for follow up therapy are one component of a multi-disciplinary discharge planning process, led by the attending physician.  Recommendations may be updated based on patient status, additional functional criteria and insurance authorization.  Follow Up Recommendations Home health PT;Outpatient PT (Any post acute therapy will greatly help pt)    Equipment Recommendations  Rolling walker with 5" wheels;3in1 (PT);Other (comment) (bariatric size)    Recommendations for Other Services       Precautions / Restrictions Precautions Precautions: Fall Precaution Comments: abdominal surgical incision Restrictions Weight Bearing Restrictions:  No      Mobility  Bed Mobility               General bed mobility comments: Deferred (pt in recliner beginning/end of session)    Transfers Overall transfer level: Modified independent Equipment used: Rolling walker (2 wheeled) (bariatric) Transfers: Sit to/from Stand Sit to Stand: Modified independent (Device/Increase time) Stand pivot transfers: Modified independent (Device/Increase time)       General transfer comment: steady safe transfers with bariatric RW use  Ambulation/Gait Ambulation/Gait assistance: Supervision Gait Distance (Feet): 200 Feet Assistive device: Rolling walker (2 wheeled) (bariatric) Gait Pattern/deviations: Wide base of support;Trunk flexed Gait velocity: decreased   General Gait Details: steady ambulation with walker  Stairs            Wheelchair Mobility    Modified Rankin (Stroke Patients Only)       Balance Overall balance assessment: Needs assistance Sitting-balance support: Feet supported;No upper extremity supported Sitting balance-[name]: Normal Sitting balance - Comments: steady sitting reaching outside BOS   Standing balance support: Bilateral upper extremity supported;During functional activity Standing balance-[name]: Good Standing balance comment: pt requiring UE support for ambulation                             Pertinent Vitals/Pain Pain Assessment: No/denies pain Pain Intervention(s): Limited activity within patient's tolerance;Monitored during session;Repositioned Vitals (HR and O2 on room air) stable and WFL throughout treatment session.    Home Living Family/patient expects to be discharged to:: Other (Comment) (pt reports plan to discharge to hotel)   Available Help at Discharge:  (None) Type of Home: Homeless [employer])           Additional Comments: Pt has been homeless and  living in car since June    Prior Function           Comments: Per previous therapy note "Pt  reports he had occasional jobs but only for 1-2 weeks at a time. Last job was at [employer] but he could only tolerate standing for 15 minutes before becoming "winded" & needing to sit down. Pt reports he has a "bad habit of laying around". Pt also reports hx of plantar fasciitis and while living in his car it was easier to void bowels/bladder on himself than it was to walk to a bathroom to use toilet 2/2 foot pain. Pt reports he recently joined a [employer] to have somewhere to shower but was unable to ambulate into building".     Hand Dominance   Dominant Hand: Right    Extremity/Trunk Assessment   Upper Extremity Assessment Upper Extremity Assessment: Overall WFL for tasks assessed    Lower Extremity Assessment Lower Extremity Assessment: Generalized weakness    Cervical / Trunk Assessment Cervical / Trunk Assessment: Other exceptions Cervical / Trunk Exceptions: forward head/shoulders  Communication   Communication: No difficulties  Cognition Arousal/Alertness: Awake/alert Behavior During Therapy: WFL for tasks assessed/performed Overall Cognitive Status: Within Functional Limits for tasks assessed                                 General Comments: Pleasant and cooperative.      General Comments  Nursing cleared pt for participation in physical therapy.  Pt agreeable to PT session.    Exercises     Assessment/Plan    PT Assessment Patient needs continued PT services  PT Problem List Decreased strength;Decreased mobility;Obesity;Decreased activity tolerance;Cardiopulmonary status limiting activity;Decreased skin integrity;Decreased knowledge of use of DME;Decreased balance       PT Treatment Interventions DME instruction;Therapeutic activities;Gait training;Therapeutic exercise;Patient/family education;Stair training;Balance training;Functional mobility training    PT Goals (Current goals can be found in the Care Plan section)  Acute Rehab PT  Goals Patient Stated Goal: to discharge from hospital PT Goal Formulation: With patient Time For Goal Achievement: 12/14/20 Potential to Achieve Goals: Good    Frequency Min 2X/week   Barriers to discharge Decreased caregiver support;Inaccessible home environment      Co-evaluation               AM-PAC PT "6 Clicks" Mobility  Outcome Measure Help needed turning from your back to your side while in a flat bed without using bedrails?: A Little Help needed moving from lying on your back to sitting on the side of a flat bed without using bedrails?: A Little Help needed moving to and from a bed to a chair (including a wheelchair)?: A Little Help needed standing up from a chair using your arms (e.g., wheelchair or bedside chair)?: A Little Help needed to walk in hospital room?: A Little Help needed climbing 3-5 steps with a railing? : A Little 6 Click Score: 18    End of Session Equipment Utilized During Treatment: Gait belt Activity Tolerance: Patient tolerated treatment well Patient left: in chair;with call bell/phone within reach;with chair alarm set Nurse Communication: Mobility status;Precautions PT Visit Diagnosis: Muscle weakness (generalized) (M62.81);Difficulty in walking, not elsewhere classified (R26.2)    Time: 1115-1140 PT Time Calculation (min) (ACUTE ONLY): 25 min   Charges:   PT Evaluation $PT Re-evaluation: 1 Re-eval PT Treatments $Therapeutic Exercise: 8-22 mins       Hendricks Limes, PT  11/30/20, 12:10 PM

## 2020-11-30 NOTE — Progress Notes (Signed)
Patient ambulated on room air using walker.  Lowest oxygenation during ambulation 90% on room air.  Patient denied being short of breath.  95% on room air at rest.

## 2020-12-01 ENCOUNTER — Other Ambulatory Visit: Payer: Self-pay

## 2020-12-09 ENCOUNTER — Other Ambulatory Visit: Payer: Self-pay

## 2020-12-09 ENCOUNTER — Emergency Department
Admission: EM | Admit: 2020-12-09 | Discharge: 2020-12-09 | Disposition: A | Discharge status: Home / Self Care | Payer: Medicaid Other | Attending: Emergency Medicine | Admitting: Emergency Medicine

## 2020-12-09 ENCOUNTER — Emergency Department: Payer: Medicaid Other

## 2020-12-09 DIAGNOSIS — I110 Hypertensive heart disease with heart failure: Secondary | ICD-10-CM | POA: Insufficient documentation

## 2020-12-09 DIAGNOSIS — I5032 Chronic diastolic (congestive) heart failure: Secondary | ICD-10-CM | POA: Insufficient documentation

## 2020-12-09 DIAGNOSIS — R5381 Other malaise: Secondary | ICD-10-CM | POA: Insufficient documentation

## 2020-12-09 LAB — BASIC METABOLIC PANEL
Anion gap: 16 — ABNORMAL HIGH (ref 5–15)
BUN: 21 mg/dL — ABNORMAL HIGH (ref 6–20)
CO2: 25 mmol/L (ref 22–32)
Calcium: 9.1 mg/dL (ref 8.9–10.3)
Chloride: 97 mmol/L — ABNORMAL LOW (ref 98–111)
Creatinine, Ser: 1.21 mg/dL (ref 0.61–1.24)
GFR, Estimated: >60 mL/min (ref >60)
Glucose, Bld: 150 mg/dL — ABNORMAL HIGH (ref 70–99)
Potassium: 3.4 mmol/L — ABNORMAL LOW (ref 3.5–5.1)
Sodium: 138 mmol/L (ref 135–145)

## 2020-12-09 LAB — CBC
HCT: 39.7 % (ref 39.0–52.0)
Hemoglobin: 12.4 g/dL — ABNORMAL LOW (ref 13.0–17.0)
MCH: 24.9 pg — ABNORMAL LOW (ref 26.0–34.0)
MCHC: 31.2 g/dL (ref 30.0–36.0)
MCV: 79.9 fL — ABNORMAL LOW (ref 80.0–100.0)
Platelets: 361 10*3/uL (ref 150–400)
RBC: 4.97 MIL/uL (ref 4.22–5.81)
RDW: 15 % (ref 11.5–15.5)
WBC: 10.5 10*3/uL (ref 4.0–10.5)
nRBC: 0 % (ref 0.0–0.2)

## 2020-12-09 NOTE — ED Notes (Signed)
E signature pad did not work for discharge signing. Pt understands that will be discharged and that CSW was consulted.

## 2020-12-09 NOTE — ED Triage Notes (Signed)
Pt to ED for numerous complaints States was discharged a few weeks ago, is still homeless. States he is here for physical therapy to regain strength States still gets short of breath when walking, was taken off oxygen a week before discharge.  Pt shob with exertion

## 2020-12-09 NOTE — ED Provider Notes (Addendum)
[facility]  ____________________________________________   Event Date/Time   First MD Initiated Contact with Patient 12/09/20 1301     (approximate)  I have reviewed the triage vital signs and the nursing notes.   HISTORY  Chief Complaint Shortness of Breath and Homeless    HPI Christian [name] is a 45 y.o. male past medical history of morbid obesity, chronic diastolic heart failure, obesity hypoventilation syndrome, depression with recent prolonged admission for pressure ulcers and perforated bowel leading to septic shock and acute respiratory failure.  He was admitted from mid September to mid October, discharged 10 days ago.  He was not on oxygen at the time of discharge and the plan was for him to go to a hotel for a week, which his sister paid for and for him to then tried to get into [place] [facility].  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  Unfortunately he was too weak to do so.  He had been discharged using a walker and trying to walk without using the walker but became dyspneic when doing so.  He feels like he needs some physical therapy, did not have any rehab at the time of discharge.  He denies any new symptoms, he has chronic leg edema and his dyspnea is really at baseline.  His wounds have been healing.  No new issues today.         Past Medical History:  Diagnosis Date   Allergic rhinitis    Elevated blood pressure reading without diagnosis of hypertension    History of chicken pox    Obesity     Patient Active Problem List   Diagnosis Date Noted   Pleural effusion 11/25/2020   Chronic diastolic CHF (congestive heart failure) (HCC) 11/25/2020   Depression 11/25/2020   Obesity hypoventilation syndrome (HCC) 10/17/2020   Acute on chronic respiratory failure with hypoxia and hypercapnia (HCC)    Bowel perforation (HCC)    Hypotension    Acute respiratory  failure with hypoxia (HCC)    Weakness    Cellulitis of abdominal wall 10/10/2020   Impaired fasting glucose    Severe sepsis (HCC) 10/09/2020   Hyperglycemia 10/09/2020   AKI (acute kidney injury) (HCC) 10/09/2020   Pressure ulcer of back 10/09/2020   Pressure ulcer of buttock 10/09/2020   Essential hypertension 10/09/2020   Chest pain 04/06/2011   Morbid obesity (HCC)    Elevated blood pressure reading without diagnosis of hypertension     Past Surgical History:  Procedure Laterality Date   TONSILLECTOMY AND ADENOIDECTOMY  1985    Prior to Admission medications   Medication Sig Start Date End Date Taking? Authorizing Provider  furosemide (LASIX) 40 MG tablet Take 1 tablet (40 mg total) by mouth 2 (two) times daily. 11/30/20   Danford, Earl Lites, MD  iron polysaccharides (NIFEREX) 150 MG capsule Take 1 capsule (150 mg total) by mouth once daily. 12/01/20   Danford, Earl Lites, MD  potassium chloride SA (KLOR-CON) 20 MEQ tablet Take 1 tablet (20 mEq total) by mouth 2 (two) times daily. 11/30/20   Danford, Earl Lites, MD  sertraline (ZOLOFT) 50 MG tablet Take 1 tablet (50 mg total) by mouth once daily. 12/01/20   Danford, Earl Lites, MD    Allergies Patient has no known allergies.  Family History  Problem Relation Age of Onset   Coronary artery disease Father 54  MI   Hypertension Father    Hyperlipidemia Father    Diabetes Father    Heart disease Mother        CHF, pacer   Cancer Mother        lymphoma   Stroke Paternal Grandmother    Diabetes Paternal Grandmother     Social History Social History   Tobacco Use   Smoking status: Never   Smokeless tobacco: Never  Substance Use Topics   Alcohol use: No   Drug use: No    Review of Systems   Review of Systems  Constitutional:  Positive for fatigue. Negative for fever.  Respiratory:  Positive for shortness of breath. Negative for cough and chest tightness.   Cardiovascular:  Positive for leg  swelling. Negative for chest pain.  Gastrointestinal:  Negative for abdominal pain, nausea and vomiting.  All other systems reviewed and are negative.  Physical Exam Updated Vital Signs BP (!) 122/55   Pulse 92   Temp 98.3 F (36.8 C) (Oral)   Resp 16   Ht 6\' 3"  (1.905 m)   Wt (!) 195 kg   SpO2 100%   BMI 53.75 kg/m   Physical Exam Vitals and nursing note reviewed.  Constitutional:      General: He is not in acute distress.    Appearance: Normal appearance. He is obese.  HENT:     Head: Normocephalic and atraumatic.  Eyes:     General: No scleral icterus.    Conjunctiva/sclera: Conjunctivae normal.  Pulmonary:     Effort: Pulmonary effort is normal. No respiratory distress.     Breath sounds: Normal breath sounds. No wheezing.  Abdominal:     Comments: Midline vertical abdominal incision appears well-healed, no abdominal tenderness  Musculoskeletal:        General: No deformity or signs of injury.     Cervical back: Normal range of motion.     Right lower leg: Edema present.     Left lower leg: Edema present.     Comments: 1+ lower extremity edema bilaterally, chronic venous stasis changes  Skin:    Coloration: Skin is not jaundiced or pale.  Neurological:     General: No focal deficit present.     Mental Status: He is alert and oriented to person, place, and time. Mental status is at baseline.  Psychiatric:        Mood and Affect: Mood normal.        Behavior: Behavior normal.     LABS (all labs ordered are listed, but only abnormal results are displayed)  Labs Reviewed  CBC - Abnormal; Notable for the following components:      Result Value   Hemoglobin 12.4 (*)    MCV 79.9 (*)    MCH 24.9 (*)    All other components within normal limits  BASIC METABOLIC PANEL - Abnormal; Notable for the following components:   Potassium 3.4 (*)    Chloride 97 (*)    Glucose, Bld 150 (*)    BUN 21 (*)    Anion gap 16 (*)    All other components within normal limits    ____________________________________________  EKG Sinus tachycardia, normal axis, normal intervals, no acute ischemic changes  ____________________________________________  RADIOLOGY , personally viewed and evaluated these images (plain radiographs) as part of my medical decision making, as well as reviewing the written report by the radiologist.  ED MD interpretation:  I reviewed the CXR which does not show  any acute cardiopulmonary process      ____________________________________________   PROCEDURES  Procedure(s) performed (including Critical Care):  Procedures   ____________________________________________   INITIAL IMPRESSION / ASSESSMENT AND PLAN / ED COURSE     Patient is a 45 year old male with recent prolonged admission for septic shock due to pressure ulcers, perforated viscus and acute on chronic respiratory failure now presenting because he has no place to go.  He was discharged to a hotel which is sister paid for for 1 week and the plan had been to go to [place] recovery however he is unable to get into this facility because he is too weak to perform manual labor.  He really has no new symptoms today.  His dyspnea is chronic.  Wounds are healing no abdominal pain.  He unfortunately had a 16-month hospital stay and no rehab afterwards so I suspect that he is significantly deconditioned.  He was deconditioned prior to coming to the hospital and is morbidly obese all of which are contributing to his dyspnea.  The patient has no medical insurance which made it difficult for him to get placed in the first place.  Will discuss with social work.  Patient's labs are largely at baseline.  His chest x-ray does not show any new pleural effusions or pulmonary edema-Per radiology there is some questionable bibasilar atelectasis however this is not significantly changed compared to his chest x-ray at discharge.  Vital signs notable for mild tachypnea, afebrile.   No other medical work-up indicated at this time.     I spoke with the social worker and unfortunately there is not any other resources that we can offer him at this time.  Discussed with the patient that he will need to try another homeless shelter.  He does have someone from his church who can pick him up and drive him to 1 of these.  Patient was given supplies to dress his abdominal wound.  ____________________________________________   FINAL CLINICAL IMPRESSION(S) / ED DIAGNOSES  Final diagnoses:  Physical deconditioning     ED Discharge Orders     None        Note:  This document was prepared using Dragon voice recognition software and may include unintentional dictation errors.    Georga Hacking, MD 12/09/20 1400    Georga Hacking, MD 12/09/20 [phone]

## 2020-12-09 NOTE — ED Notes (Signed)
Pt back from x-ray.

## 2020-12-09 NOTE — ED Notes (Signed)
Pt to ED for continued weakness and inability to work or care for self. Pt was hospitalized for 2 months this summer and had abdominal surgery for bowel obstruction and also had thoracentesis for large pleural effusion and developed sepsis. Pt is homeless and has no family members and was living out of car.

## 2020-12-16 ENCOUNTER — Other Ambulatory Visit: Payer: Self-pay

## 2020-12-17 ENCOUNTER — Other Ambulatory Visit: Payer: Self-pay

## 2020-12-22 ENCOUNTER — Other Ambulatory Visit: Payer: Self-pay

## 2020-12-24 ENCOUNTER — Other Ambulatory Visit: Payer: Self-pay

## 2020-12-24 ENCOUNTER — Ambulatory Visit: Payer: Self-pay | Admitting: Internal Medicine

## 2020-12-24 ENCOUNTER — Encounter: Payer: Self-pay | Admitting: Internal Medicine

## 2020-12-24 VITALS — BP 123/84 | HR 94 | Temp 98.1°F | Resp 18 | Ht 73.0 in | Wt >= 6400 oz

## 2020-12-24 DIAGNOSIS — Z0000 Encounter for general adult medical examination without abnormal findings: Secondary | ICD-10-CM

## 2020-12-24 DIAGNOSIS — L89312 Pressure ulcer of right buttock, stage 2: Secondary | ICD-10-CM

## 2020-12-24 DIAGNOSIS — E559 Vitamin D deficiency, unspecified: Secondary | ICD-10-CM

## 2020-12-24 MED ORDER — VITAMIN D 25 MCG (1000 UNIT) PO TABS: 1000.0000 [IU] | ORAL_TABLET | Freq: Every day | ORAL | Status: AC

## 2020-12-24 MED ORDER — POTASSIUM CHLORIDE CRYS ER 20 MEQ PO TBCR
20.0000 meq | EXTENDED_RELEASE_TABLET | Freq: Two times a day (BID) | ORAL | 11 refills | Status: DC
  Filled 2020-12-24 – 2020-12-30 (×2): qty 60, 30d supply, fill #0

## 2020-12-24 MED ORDER — FLUCONAZOLE 100 MG PO TABS
100.0000 mg | ORAL_TABLET | Freq: Every day | ORAL | 0 refills | Status: DC
  Filled 2020-12-24 (×2): qty 7, 7d supply, fill #0

## 2020-12-24 MED ORDER — DOXYCYCLINE HYCLATE 50 MG PO CAPS: 100.0000 mg | ORAL_CAPSULE | Freq: Two times a day (BID) | ORAL | 0 refills | Status: AC

## 2020-12-24 MED ORDER — VITAMIN B-12 100 MCG PO TABS
100.0000 ug | ORAL_TABLET | Freq: Every day | ORAL | 0 refills | Status: DC
  Filled 2020-12-24: qty 30, 30d supply, fill #0

## 2020-12-24 MED ORDER — FUROSEMIDE 20 MG PO TABS
40.0000 mg | ORAL_TABLET | Freq: Two times a day (BID) | ORAL | 1 refills | Status: DC
  Filled 2020-12-24: qty 60, 15d supply, fill #0
  Filled 2020-12-30: qty 120, 30d supply, fill #0

## 2020-12-24 MED ORDER — SERTRALINE HCL 50 MG PO TABS
50.0000 mg | ORAL_TABLET | Freq: Every day | ORAL | 11 refills | Status: DC
Start: 2020-12-24 — End: 2021-07-13
  Filled 2020-12-24 – 2020-12-30 (×2): qty 30, 30d supply, fill #0
  Filled 2021-02-09: qty 30, 30d supply, fill #1
  Filled 2021-03-08 – 2021-03-11 (×2): qty 30, 30d supply, fill #2
  Filled 2021-03-15: qty 30, 30d supply, fill #0
  Filled 2021-04-13: qty 30, 30d supply, fill #1

## 2020-12-24 MED ORDER — DOXYCYCLINE HYCLATE 100 MG PO CAPS
ORAL_CAPSULE | ORAL | 0 refills | Status: DC
  Filled 2020-12-24 (×2): qty 10, 5d supply, fill #0

## 2020-12-24 NOTE — Patient Instructions (Signed)
Please Schedule  appointment with [facility] provider.   Please refer to Heart Failure clinic.   Needs assistance financial and SW referral.

## 2020-12-24 NOTE — Progress Notes (Signed)
New Patient Office Visit  Subjective:  Patient ID: Amore Grater, male    DOB: 1975-08-20  Age: 45 y.o. MRN: [id number]  CC: No chief complaint on file.   HPI Kalid Ghan Oley presents for Morbid obesity, Chronic Diastolic HF, pleural effusion, HTN, Obesity Hypoventilation syndrome, Depression, recent admission 11/2020 for perforated cecum, S/P sx repair of cecum perforation, Acute on chronic respiratory failure, S/P thoracentesis. Repeated chest x ray 10-12 atelectasis no pleural effusion.      Past Medical History:  Diagnosis Date   Allergic rhinitis    Elevated blood pressure reading without diagnosis of hypertension    History of chicken pox    Obesity     Past Surgical History:  Procedure Laterality Date   TONSILLECTOMY AND ADENOIDECTOMY  1985    Family History  Problem Relation Age of Onset   Coronary artery disease Father 54       MI   Hypertension Father    Hyperlipidemia Father    Diabetes Father    Heart disease Mother        CHF, pacer   Cancer Mother        lymphoma   Stroke Paternal Grandmother    Diabetes Paternal Grandmother     Social History   Socioeconomic History   Marital status: Single    Spouse name: Not on file   Number of children: Not on file   Years of education: Not on file   Highest education level: Not on file  Occupational History   Not on file  Tobacco Use   Smoking status: Never   Smokeless tobacco: Never  Substance and Sexual Activity   Alcohol use: No   Drug use: No   Sexual activity: Not on file  Other Topics Concern   Not on file  Social History Narrative   Caffeine: occasional   Lives alone, near parents.     Occupation: works at [employer] part time   Edu: some college   Diet: some water, some fruits/vegetables, 1-2x/wk red meat, fish 1x/wk   Social Determinants of Corporate investment banker Strain: Not on file  Food Insecurity: Not on file  Transportation Needs: Not on file  Physical Activity:  Not on file  Stress: Not on file  Social Connections: Not on file  Intimate Partner Violence: Not on file    ROS Review of Systems  Constitutional:  Negative for activity change, appetite change, fever and unexpected weight change.  Respiratory:  Negative for shortness of breath.   Cardiovascular:  Positive for leg swelling. Negative for chest pain.  Gastrointestinal:  Negative for abdominal pain and nausea.  Psychiatric/Behavioral:  Negative for agitation.    Objective:   Today's Vitals: There were no vitals taken for this visit.  Physical Exam Constitutional:      Appearance: He is obese.  HENT:     Head: Normocephalic.  Cardiovascular:     Rate and Rhythm: Normal rate.     Pulses: Normal pulses.  Pulmonary:     Effort: Pulmonary effort is normal.     Breath sounds: No wheezing or rales.  Abdominal:     General: Bowel sounds are normal. There is no distension.     Palpations: Abdomen is soft.     Tenderness: There is no guarding.  Neurological:     Mental Status: He is alert.   Midline abdominal wound healing, small opening, no drainage, healing.  Thigh wound; redness, small amount of drainage stage 2  wound.   Assessment & Plan:   1-Cecum Perforation:  S/P cecal perforation repair. Developed hematoma, had evacuation and open midline wound. Wound healed enough that outpatient follow up was recommended.  Follow up with Dr Sung Amabile.  He has appointment next week with Dr Tonna Boehringer.  He will need at some point referral for Colonoscopy.   2-Chronic Diastolic HF;  Continue with Furosemide.  Plan to refer to Heart failure clinic.  Continue with potasium supplement.   3-HTN; Continue with furosemide.   Stage 2 Right thigh wound; with some redness and small amount of drainage.  Will provide Short course of Doxy and fluconazole.  Continue with xeroform gauze.  Referral to out patient clinic.  Advised patient to change position frequently.   Depression:  On Zoloft.  New medication, recent hospitalization.  Refer to [facility] clinic.   Impair fasting Glucose;  -Plan to check A1c.   Low normal Vitamin D;  Start supplement.   Low normal B-12, Macrocytosis;  Start B 12 supplement.     Problem List Items Addressed This Visit   None   Outpatient Encounter Medications as of 12/24/2020  Medication Sig   furosemide (LASIX) 40 MG tablet Take 1 tablet (40 mg total) by mouth 2 (two) times daily.   iron polysaccharides (NIFEREX) 150 MG capsule Take 1 capsule (150 mg total) by mouth once daily.   potassium chloride SA (KLOR-CON) 20 MEQ tablet Take 1 tablet (20 mEq total) by mouth 2 (two) times daily.   sertraline (ZOLOFT) 50 MG tablet Take 1 tablet (50 mg total) by mouth once daily.   No facility-administered encounter medications on file as of 12/24/2020.    Follow-up: No follow-ups on file.   [name], MD

## 2020-12-25 LAB — COMPREHENSIVE METABOLIC PANEL
ALT: 20 IU/L (ref 0–44)
AST: 21 IU/L (ref 0–40)
Albumin/Globulin Ratio: 1.4 (ref 1.2–2.2)
Albumin: 4.2 g/dL (ref 4.0–5.0)
Alkaline Phosphatase: 96 IU/L (ref 44–121)
BUN/Creatinine Ratio: 15 (ref 9–20)
BUN: 17 mg/dL (ref 6–24)
Bilirubin Total: 0.8 mg/dL (ref 0.0–1.2)
CO2: 22 mmol/L (ref 20–29)
Calcium: 9 mg/dL (ref 8.7–10.2)
Chloride: 100 mmol/L (ref 96–106)
Creatinine, Ser: 1.14 mg/dL (ref 0.76–1.27)
Globulin, Total: 3 g/dL (ref 1.5–4.5)
Glucose: 125 mg/dL — ABNORMAL HIGH (ref 70–99)
Potassium: 4.4 mmol/L (ref 3.5–5.2)
Sodium: 140 mmol/L (ref 134–144)
Total Protein: 7.2 g/dL (ref 6.0–8.5)
eGFR: 80 mL/min/{1.73_m2} (ref >59)

## 2020-12-25 LAB — LIPID PANEL
Chol/HDL Ratio: 3.6 ratio (ref 0.0–5.0)
Cholesterol, Total: 153 mg/dL (ref 100–199)
HDL: 42 mg/dL (ref >39)
LDL Chol Calc (NIH): 88 mg/dL (ref 0–99)
Triglycerides: 126 mg/dL (ref 0–149)
VLDL Cholesterol Cal: 23 mg/dL (ref 5–40)

## 2020-12-25 LAB — HEMOGLOBIN A1C
Est. average glucose Bld gHb Est-mCnc: 126 mg/dL
Hgb A1c MFr Bld: 6 % — ABNORMAL HIGH (ref 4.8–5.6)

## 2020-12-25 LAB — TSH: TSH: 2.29 u[IU]/mL (ref 0.450–4.500)

## 2020-12-30 ENCOUNTER — Ambulatory Visit: Payer: Medicaid Other | Admitting: Pharmacy Technician

## 2020-12-30 ENCOUNTER — Other Ambulatory Visit: Payer: Self-pay

## 2020-12-30 DIAGNOSIS — Z79899 Other long term (current) drug therapy: Secondary | ICD-10-CM

## 2021-01-01 NOTE — Progress Notes (Signed)
  Completed [facility] application and contract.  Patient agreed to all terms of the [facility] contract.    Patient approved to receive medication assistance at [facility] until time for re-certification in 2023, and as long as eligibility criteria continues to be met.    Provided patient with community resource material based on his particular needs.    Chari Parmenter J. Akshara Blumenthal Care Manager [facility]  

## 2021-01-05 ENCOUNTER — Encounter: Payer: Self-pay | Attending: Physician Assistant | Admitting: Physician Assistant

## 2021-01-05 ENCOUNTER — Other Ambulatory Visit: Payer: Self-pay

## 2021-01-05 DIAGNOSIS — Z5901 Sheltered homelessness: Secondary | ICD-10-CM | POA: Insufficient documentation

## 2021-01-05 DIAGNOSIS — I5042 Chronic combined systolic (congestive) and diastolic (congestive) heart failure: Secondary | ICD-10-CM | POA: Insufficient documentation

## 2021-01-05 DIAGNOSIS — L89002 Pressure ulcer of unspecified elbow, stage 2: Secondary | ICD-10-CM | POA: Insufficient documentation

## 2021-01-05 DIAGNOSIS — Z6841 Body Mass Index (BMI) 40.0 and over, adult: Secondary | ICD-10-CM | POA: Insufficient documentation

## 2021-01-05 DIAGNOSIS — I110 Hypertensive heart disease with heart failure: Secondary | ICD-10-CM | POA: Insufficient documentation

## 2021-01-08 ENCOUNTER — Other Ambulatory Visit: Payer: Self-pay

## 2021-01-08 ENCOUNTER — Emergency Department: Payer: Self-pay

## 2021-01-08 ENCOUNTER — Inpatient Hospital Stay
Admission: EM | Admit: 2021-01-08 | Discharge: 2021-02-12 | DRG: 871 | Disposition: A | Discharge status: Home / Self Care | Payer: Self-pay | Attending: Internal Medicine | Admitting: Internal Medicine

## 2021-01-08 DIAGNOSIS — Z8249 Family history of ischemic heart disease and other diseases of the circulatory system: Secondary | ICD-10-CM

## 2021-01-08 DIAGNOSIS — Z6841 Body Mass Index (BMI) 40.0 and over, adult: Secondary | ICD-10-CM

## 2021-01-08 DIAGNOSIS — I5032 Chronic diastolic (congestive) heart failure: Secondary | ICD-10-CM | POA: Diagnosis present

## 2021-01-08 DIAGNOSIS — J09X1 Influenza due to identified novel influenza A virus with pneumonia: Secondary | ICD-10-CM | POA: Diagnosis present

## 2021-01-08 DIAGNOSIS — R7303 Prediabetes: Secondary | ICD-10-CM | POA: Diagnosis present

## 2021-01-08 DIAGNOSIS — A419 Sepsis, unspecified organism: Principal | ICD-10-CM

## 2021-01-08 DIAGNOSIS — Z833 Family history of diabetes mellitus: Secondary | ICD-10-CM

## 2021-01-08 DIAGNOSIS — J101 Influenza due to other identified influenza virus with other respiratory manifestations: Secondary | ICD-10-CM

## 2021-01-08 DIAGNOSIS — E876 Hypokalemia: Secondary | ICD-10-CM | POA: Diagnosis present

## 2021-01-08 DIAGNOSIS — L03317 Cellulitis of buttock: Secondary | ICD-10-CM | POA: Diagnosis present

## 2021-01-08 DIAGNOSIS — I110 Hypertensive heart disease with heart failure: Secondary | ICD-10-CM | POA: Diagnosis present

## 2021-01-08 DIAGNOSIS — T502X5A Adverse effect of carbonic-anhydrase inhibitors, benzothiadiazides and other diuretics, initial encounter: Secondary | ICD-10-CM | POA: Diagnosis not present

## 2021-01-08 DIAGNOSIS — L89892 Pressure ulcer of other site, stage 2: Secondary | ICD-10-CM | POA: Diagnosis present

## 2021-01-08 DIAGNOSIS — L89313 Pressure ulcer of right buttock, stage 3: Secondary | ICD-10-CM | POA: Diagnosis present

## 2021-01-08 DIAGNOSIS — E66813 Obesity, class 3: Secondary | ICD-10-CM

## 2021-01-08 DIAGNOSIS — T501X5A Adverse effect of loop [high-ceiling] diuretics, initial encounter: Secondary | ICD-10-CM | POA: Diagnosis not present

## 2021-01-08 DIAGNOSIS — Z79899 Other long term (current) drug therapy: Secondary | ICD-10-CM

## 2021-01-08 DIAGNOSIS — F32A Depression, unspecified: Secondary | ICD-10-CM | POA: Diagnosis present

## 2021-01-08 DIAGNOSIS — R6520 Severe sepsis without septic shock: Secondary | ICD-10-CM

## 2021-01-08 DIAGNOSIS — N179 Acute kidney failure, unspecified: Secondary | ICD-10-CM

## 2021-01-08 DIAGNOSIS — E662 Morbid (severe) obesity with alveolar hypoventilation: Secondary | ICD-10-CM | POA: Diagnosis present

## 2021-01-08 DIAGNOSIS — F419 Anxiety disorder, unspecified: Secondary | ICD-10-CM | POA: Diagnosis present

## 2021-01-08 DIAGNOSIS — G4734 Idiopathic sleep related nonobstructive alveolar hypoventilation: Secondary | ICD-10-CM

## 2021-01-08 DIAGNOSIS — R0602 Shortness of breath: Secondary | ICD-10-CM

## 2021-01-08 DIAGNOSIS — Z91013 Allergy to seafood: Secondary | ICD-10-CM

## 2021-01-08 DIAGNOSIS — Z20822 Contact with and (suspected) exposure to covid-19: Secondary | ICD-10-CM | POA: Diagnosis present

## 2021-01-08 DIAGNOSIS — J9601 Acute respiratory failure with hypoxia: Secondary | ICD-10-CM | POA: Diagnosis present

## 2021-01-08 DIAGNOSIS — L89309 Pressure ulcer of unspecified buttock, unspecified stage: Secondary | ICD-10-CM | POA: Diagnosis present

## 2021-01-08 DIAGNOSIS — J309 Allergic rhinitis, unspecified: Secondary | ICD-10-CM | POA: Diagnosis present

## 2021-01-08 DIAGNOSIS — Z5900 Homelessness unspecified: Secondary | ICD-10-CM

## 2021-01-08 DIAGNOSIS — I10 Essential (primary) hypertension: Secondary | ICD-10-CM | POA: Diagnosis present

## 2021-01-08 DIAGNOSIS — Z5901 Sheltered homelessness: Secondary | ICD-10-CM

## 2021-01-08 DIAGNOSIS — L0231 Cutaneous abscess of buttock: Secondary | ICD-10-CM | POA: Diagnosis present

## 2021-01-08 DIAGNOSIS — R7989 Other specified abnormal findings of blood chemistry: Secondary | ICD-10-CM

## 2021-01-08 DIAGNOSIS — Z638 Other specified problems related to primary support group: Secondary | ICD-10-CM

## 2021-01-08 DIAGNOSIS — J1000 Influenza due to other identified influenza virus with unspecified type of pneumonia: Secondary | ICD-10-CM | POA: Diagnosis present

## 2021-01-08 LAB — CBC WITH DIFFERENTIAL/PLATELET
Abs Immature Granulocytes: 0.03 10*3/uL (ref 0.00–0.07)
Basophils Absolute: 0 10*3/uL (ref 0.0–0.1)
Basophils Relative: 0 %
Eosinophils Absolute: 0 10*3/uL (ref 0.0–0.5)
Eosinophils Relative: 0 %
HCT: 40.9 % (ref 39.0–52.0)
Hemoglobin: 12.8 g/dL — ABNORMAL LOW (ref 13.0–17.0)
Immature Granulocytes: 0 %
Lymphocytes Relative: 5 %
Lymphs Abs: 0.5 10*3/uL — ABNORMAL LOW (ref 0.7–4.0)
MCH: 25.3 pg — ABNORMAL LOW (ref 26.0–34.0)
MCHC: 31.3 g/dL (ref 30.0–36.0)
MCV: 80.8 fL (ref 80.0–100.0)
Monocytes Absolute: 0.7 10*3/uL (ref 0.1–1.0)
Monocytes Relative: 7 %
Neutro Abs: 8.9 10*3/uL — ABNORMAL HIGH (ref 1.7–7.7)
Neutrophils Relative %: 88 %
Platelets: 333 10*3/uL (ref 150–400)
RBC: 5.06 MIL/uL (ref 4.22–5.81)
RDW: 15.6 % — ABNORMAL HIGH (ref 11.5–15.5)
WBC: 10 10*3/uL (ref 4.0–10.5)
nRBC: 0 % (ref 0.0–0.2)

## 2021-01-08 LAB — RESP PANEL BY RT-PCR (FLU A&B, COVID) ARPGX2
Influenza A by PCR: POSITIVE — AB
Influenza B by PCR: NEGATIVE
SARS Coronavirus 2 by RT PCR: NEGATIVE

## 2021-01-08 LAB — COMPREHENSIVE METABOLIC PANEL
ALT: 17 U/L (ref 0–44)
AST: 18 U/L (ref 15–41)
Albumin: 3.5 g/dL (ref 3.5–5.0)
Alkaline Phosphatase: 69 U/L (ref 38–126)
Anion gap: 7 (ref 5–15)
BUN: 13 mg/dL (ref 6–20)
CO2: 27 mmol/L (ref 22–32)
Calcium: 8.4 mg/dL — ABNORMAL LOW (ref 8.9–10.3)
Chloride: 100 mmol/L (ref 98–111)
Creatinine, Ser: 1.11 mg/dL (ref 0.61–1.24)
GFR, Estimated: >60 mL/min (ref >60)
Glucose, Bld: 140 mg/dL — ABNORMAL HIGH (ref 70–99)
Potassium: 3.8 mmol/L (ref 3.5–5.1)
Sodium: 134 mmol/L — ABNORMAL LOW (ref 135–145)
Total Bilirubin: 0.8 mg/dL (ref 0.3–1.2)
Total Protein: 7.5 g/dL (ref 6.5–8.1)

## 2021-01-08 LAB — D-DIMER, QUANTITATIVE: D-Dimer, Quant: 1.64 ug/mL-FEU — ABNORMAL HIGH (ref 0.00–0.50)

## 2021-01-08 LAB — BLOOD GAS, VENOUS
Acid-Base Excess: 3.4 mmol/L — ABNORMAL HIGH (ref 0.0–2.0)
Bicarbonate: 28.5 mmol/L — ABNORMAL HIGH (ref 20.0–28.0)
O2 Saturation: 91.2 %
Patient temperature: 37
pCO2, Ven: 45 mmHg (ref 44.0–60.0)
pH, Ven: 7.41 (ref 7.250–7.430)
pO2, Ven: 61 mmHg — ABNORMAL HIGH (ref 32.0–45.0)

## 2021-01-08 LAB — URINALYSIS, COMPLETE (UACMP) WITH MICROSCOPIC
Bacteria, UA: NONE SEEN
Bilirubin Urine: NEGATIVE
Glucose, UA: NEGATIVE mg/dL
Ketones, ur: NEGATIVE mg/dL
Leukocytes,Ua: NEGATIVE
Nitrite: NEGATIVE
Protein, ur: NEGATIVE mg/dL
Specific Gravity, Urine: 1.017 (ref 1.005–1.030)
pH: 5 (ref 5.0–8.0)

## 2021-01-08 LAB — LACTIC ACID, PLASMA
Lactic Acid, Venous: 1 mmol/L (ref 0.5–1.9)
Lactic Acid, Venous: 1.7 mmol/L (ref 0.5–1.9)

## 2021-01-08 LAB — BRAIN NATRIURETIC PEPTIDE: B Natriuretic Peptide: 111.6 pg/mL — ABNORMAL HIGH (ref 0.0–100.0)

## 2021-01-08 LAB — PROTIME-INR
INR: 1.1 (ref 0.8–1.2)
Prothrombin Time: 14.6 seconds (ref 11.4–15.2)

## 2021-01-08 LAB — APTT: aPTT: 34 seconds (ref 24–36)

## 2021-01-08 MED ORDER — GUAIFENESIN ER 600 MG PO TB12
600.0000 mg | ORAL_TABLET | Freq: Two times a day (BID) | ORAL | Status: DC
  Administered 2021-01-09 (×2): 600 mg via ORAL
  Filled 2021-01-08 (×2): qty 1

## 2021-01-08 MED ORDER — PANTOPRAZOLE SODIUM 40 MG IV SOLR: 40.0000 mg | INTRAVENOUS | Status: DC

## 2021-01-08 MED ORDER — OSELTAMIVIR PHOSPHATE 75 MG PO CAPS
75.0000 mg | ORAL_CAPSULE | Freq: Two times a day (BID) | ORAL | Status: DC
  Administered 2021-01-08: 75 mg via ORAL
  Filled 2021-01-08 (×2): qty 1

## 2021-01-08 MED ORDER — IOHEXOL 300 MG/ML  SOLN
100.0000 mL | Freq: Once | INTRAMUSCULAR | Status: AC | PRN
  Administered 2021-01-08: 100 mL via INTRAVENOUS

## 2021-01-08 MED ORDER — OSELTAMIVIR PHOSPHATE 75 MG PO CAPS
75.0000 mg | ORAL_CAPSULE | Freq: Two times a day (BID) | ORAL | Status: DC
  Administered 2021-01-09 – 2021-01-11 (×5): 75 mg via ORAL
  Filled 2021-01-08 (×6): qty 1

## 2021-01-08 MED ORDER — FUROSEMIDE 10 MG/ML IJ SOLN
60.0000 mg | Freq: Once | INTRAMUSCULAR | Status: AC
  Administered 2021-01-08: 60 mg via INTRAVENOUS
  Filled 2021-01-08: qty 8

## 2021-01-08 MED ORDER — ACETAMINOPHEN 650 MG RE SUPP
650.0000 mg | Freq: Four times a day (QID) | RECTAL | Status: DC | PRN
  Filled 2021-01-08: qty 1

## 2021-01-08 MED ORDER — ALBUTEROL SULFATE (2.5 MG/3ML) 0.083% IN NEBU
2.5000 mg | INHALATION_SOLUTION | Freq: Four times a day (QID) | RESPIRATORY_TRACT | Status: DC
  Administered 2021-01-09 – 2021-01-10 (×5): 2.5 mg via RESPIRATORY_TRACT
  Filled 2021-01-08 (×5): qty 3

## 2021-01-08 MED ORDER — SODIUM CHLORIDE 0.9 % IV SOLN
2.0000 g | Freq: Once | INTRAVENOUS | Status: AC
  Administered 2021-01-08: 2 g via INTRAVENOUS
  Filled 2021-01-08: qty 2

## 2021-01-08 MED ORDER — VANCOMYCIN HCL IN DEXTROSE 1-5 GM/200ML-% IV SOLN
1000.0000 mg | Freq: Once | INTRAVENOUS | Status: AC
  Administered 2021-01-08: 1000 mg via INTRAVENOUS
  Filled 2021-01-08: qty 200

## 2021-01-08 MED ORDER — ACETAMINOPHEN 325 MG PO TABS
650.0000 mg | ORAL_TABLET | Freq: Once | ORAL | Status: AC
  Administered 2021-01-08: 650 mg via ORAL
  Filled 2021-01-08: qty 2

## 2021-01-08 MED ORDER — FUROSEMIDE 10 MG/ML IJ SOLN: 40.0000 mg | Freq: Once | INTRAMUSCULAR | Status: DC

## 2021-01-08 MED ORDER — SODIUM CHLORIDE 0.9 % IV SOLN: 2.0000 g | INTRAVENOUS | Status: DC

## 2021-01-08 MED ORDER — METRONIDAZOLE 500 MG/100ML IV SOLN
500.0000 mg | Freq: Once | INTRAVENOUS | Status: AC
  Administered 2021-01-08: 500 mg via INTRAVENOUS
  Filled 2021-01-08: qty 100

## 2021-01-08 MED ORDER — ACETAMINOPHEN 325 MG PO TABS
650.0000 mg | ORAL_TABLET | Freq: Four times a day (QID) | ORAL | Status: DC | PRN
  Administered 2021-01-28: 650 mg via ORAL
  Filled 2021-01-08: qty 2

## 2021-01-08 MED ORDER — HEPARIN SODIUM (PORCINE) 5000 UNIT/ML IJ SOLN: 5000.0000 [IU] | Freq: Three times a day (TID) | INTRAMUSCULAR | Status: DC

## 2021-01-08 MED ORDER — METRONIDAZOLE 500 MG/100ML IV SOLN
500.0000 mg | Freq: Two times a day (BID) | INTRAVENOUS | Status: DC
Start: 2021-01-09 — End: 2021-01-09

## 2021-01-08 MED ORDER — ASPIRIN EC 81 MG PO TBEC: 81.0000 mg | DELAYED_RELEASE_TABLET | Freq: Every day | ORAL | Status: DC

## 2021-01-08 NOTE — Progress Notes (Addendum)
Christian, [name] ([id number]) Visit Report for 01/05/2021 Allergy List Details Patient Name: Christian [name], Christian [name]. Date of Service: 01/05/2021 8:45 AM Medical Record Number: [id number] Patient Account Number: [id number] Date of Birth/Sex: Jun 27, 1975 (45 y.o. M) Treating RN: Carlene Coria Primary Care Shalini Mair: SYSTEM, PCP Other Clinician: Referring Ladasia Sircy: Niel Hummer Treating Patina Spanier/Extender: Skipper Cliche in Treatment: 0 Allergies Active Allergies Shellfish Containing Products Allergy Notes Electronic Signature(s) Signed: 01/08/2021 3:56:47 PM By: Carlene Coria RN Entered By: Carlene Coria on 01/05/2021 09:29:02 Tabone, [name] ([id number]) -------------------------------------------------------------------------------- Arrival Information Details Patient Name: Christian [name] Date of Service: 01/05/2021 8:45 AM Medical Record Number: [id number] Patient Account Number: [id number] Date of Birth/Sex: 12-04-1975 (45 y.o. M) Treating RN: Carlene Coria Primary Care Alix Stowers: SYSTEM, PCP Other Clinician: Referring Captain Blucher: Niel Hummer Treating Karlita Lichtman/Extender: Skipper Cliche in Treatment: 0 Visit Information Patient Arrived: Ambulatory Arrival Time: 09:26 Accompanied By: self Transfer Assistance: None Patient Identification Verified: Yes Secondary Verification Process Completed: Yes Patient Requires Transmission-Based Precautions: No Patient Has Alerts: No Electronic Signature(s) Signed: 01/08/2021 3:56:47 PM By: Carlene Coria RN Entered By: Carlene Coria on 01/05/2021 09:27:30 Martos, [name] ([id number]) -------------------------------------------------------------------------------- Clinic Level of Care Assessment Details Patient Name: Christian [name]. Date of Service: 01/05/2021 8:45 AM Medical Record Number: [id number] Patient Account Number: [id number] Date of Birth/Sex: 05/11/1975 (45 y.o. M) Treating RN: Carlene Coria Primary Care Marilyn Nihiser: SYSTEM, PCP Other  Clinician: Referring Jamielee Mchale: Niel Hummer Treating Merwyn Hodapp/Extender: Skipper Cliche in Treatment: 0 Clinic Level of Care Assessment Items TOOL 2 Quantity Score X - Use when only an EandM is performed on the INITIAL visit 1 0 ASSESSMENTS - Nursing Assessment / Reassessment X - General Physical Exam (combine w/ comprehensive assessment (listed just below) when performed on new 1 20 pt. evals) X- 1 25 Comprehensive Assessment (HX, ROS, Risk Assessments, Wounds Hx, etc.) ASSESSMENTS - Wound and Skin Assessment / Reassessment X - Simple Wound Assessment / Reassessment - one wound 1 5 []  - 0 Complex Wound Assessment / Reassessment - multiple wounds []  - 0 Dermatologic / Skin Assessment (not related to wound area) ASSESSMENTS - Ostomy and/or Continence Assessment and Care []  - Incontinence Assessment and Management 0 []  - 0 Ostomy Care Assessment and Management (repouching, etc.) PROCESS - Coordination of Care X - Simple Patient / Family Education for ongoing care 1 15 []  - 0 Complex (extensive) Patient / Family Education for ongoing care []  - 0 Staff obtains Programmer, systems, Records, Test Results / Process Orders []  - 0 Staff telephones HHA, Nursing Homes / Clarify orders / etc []  - 0 Routine Transfer to another Facility (non-emergent condition) []  - 0 Routine Hospital Admission (non-emergent condition) X- 1 15 New Admissions / Biomedical engineer / Ordering NPWT, Apligraf, etc. []  - 0 Emergency Hospital Admission (emergent condition) X- 1 10 Simple Discharge Coordination []  - 0 Complex (extensive) Discharge Coordination PROCESS - Special Needs []  - Pediatric / Minor Patient Management 0 []  - 0 Isolation Patient Management []  - 0 Hearing / Language / Visual special needs []  - 0 Assessment of Community assistance (transportation, D/C planning, etc.) []  - 0 Additional assistance / Altered mentation []  - 0 Support Surface(s) Assessment (bed, cushion, seat,  etc.) INTERVENTIONS - Wound Cleansing / Measurement X - Wound Imaging (photographs - any number of wounds) 1 5 []  - 0 Wound Tracing (instead of photographs) X- 1 5 Simple Wound Measurement - one wound []  - 0 Complex Wound Measurement - multiple wounds Amodio, Jhalil J. ([id number]) X- 1 5 Simple Wound  Cleansing - one wound []  - 0 Complex Wound Cleansing - multiple wounds INTERVENTIONS - Wound Dressings []  - Small Wound Dressing one or multiple wounds 0 X- 1 15 Medium Wound Dressing one or multiple wounds []  - 0 Large Wound Dressing one or multiple wounds []  - 0 Application of Medications - injection INTERVENTIONS - Miscellaneous []  - External ear exam 0 []  - 0 Specimen Collection (cultures, biopsies, blood, body fluids, etc.) []  - 0 Specimen(s) / Culture(s) sent or taken to Lab for analysis []  - 0 Patient Transfer (multiple staff / Civil Service fast streamer / Similar devices) []  - 0 Simple Staple / Suture removal (25 or less) []  - 0 Complex Staple / Suture removal (26 or more) []  - 0 Hypo / Hyperglycemic Management (close monitor of Blood Glucose) []  - 0 Ankle / Brachial Index (ABI) - do not check if billed separately Has the patient been seen at the hospital within the last three years: Yes Total Score: 120 Level Of Care: New/Established - Level 4 Electronic Signature(s) Signed: 01/08/2021 3:56:47 PM By: Carlene Coria RN Entered By: Carlene Coria on 01/05/2021 10:06:32 Ackley, [name] ([id number]) -------------------------------------------------------------------------------- Encounter Discharge Information Details Patient Name: Christian [name]. Date of Service: 01/05/2021 8:45 AM Medical Record Number: [id number] Patient Account Number: [id number] Date of Birth/Sex: Jan 25, 1976 (45 y.o. M) Treating RN: Carlene Coria Primary Care Camri Molloy: SYSTEM, PCP Other Clinician: Referring Sianne Tejada: Niel Hummer Treating Denica Web/Extender: Skipper Cliche in Treatment: 0 Encounter Discharge  Information Items Discharge Condition: Stable Ambulatory Status: Ambulatory Discharge Destination: Home Transportation: Private Auto Accompanied By: self Schedule Follow-up Appointment: Yes Clinical Summary of Care: Patient Declined Electronic Signature(s) Signed: 01/08/2021 3:56:47 PM By: Carlene Coria RN Entered By: Carlene Coria on 01/05/2021 10:08:34 [place], [name] ([id number]) -------------------------------------------------------------------------------- Lower Extremity Assessment Details Patient Name: Christian [name]. Date of Service: 01/05/2021 8:45 AM Medical Record Number: [id number] Patient Account Number: [id number] Date of Birth/Sex: 1975-04-03 (45 y.o. M) Treating RN: Carlene Coria Primary Care Jr Milliron: SYSTEM, PCP Other Clinician: Referring Jamekia Gannett: Niel Hummer Treating Cresencio Reesor/Extender: Skipper Cliche in Treatment: 0 Electronic Signature(s) Signed: 01/08/2021 3:56:47 PM By: Carlene Coria RN Entered By: Carlene Coria on 01/05/2021 09:32:04 [place], [name] ([id number]) -------------------------------------------------------------------------------- Multi Wound Chart Details Patient Name: Christian [name]. Date of Service: 01/05/2021 8:45 AM Medical Record Number: [id number] Patient Account Number: [id number] Date of Birth/Sex: [birth year] (45 y.o. M) Treating RN: Carlene Coria Primary Care Alen Matheson: SYSTEM, PCP Other Clinician: Referring Yazmyn Valbuena: Niel Hummer Treating Damire Remedios/Extender: Skipper Cliche in Treatment: 0 Vital Signs Height(in): 75 Pulse(bpm): 84 Weight(lbs): 440 Blood Pressure(mmHg): 134/88 Body Mass Index(BMI): 55 Temperature(F): 98.3 Respiratory Rate(breaths/min): 18 Photos: [N/A:N/A] Wound Location: Right Upper Leg N/A N/A Wounding Event: Pressure Injury N/A N/A Primary Etiology: Pressure Ulcer N/A N/A Comorbid History: Congestive Heart Failure, N/A N/A Hypertension Date Acquired: 10/21/2020 N/A N/A Weeks of Treatment: 0 N/A  N/A Wound Status: Open N/A N/A Measurements L x W x D (cm) 2.1x12x0.2 N/A N/A Area (cm) : 19.792 N/A N/A Volume (cm) : 3.958 N/A N/A Classification: Category/Stage II N/A N/A Exudate Amount: Medium N/A N/A Exudate Type: Serosanguineous N/A N/A Exudate Color: red, brown N/A N/A Granulation Amount: Large (67-100%) N/A N/A Granulation Quality: Red N/A N/A Necrotic Amount: None Present (0%) N/A N/A Exposed Structures: Fat Layer (Subcutaneous Tissue): N/A N/A Yes Fascia: No Tendon: No Muscle: No Joint: No Bone: No Epithelialization: None N/A N/A Treatment Notes Electronic Signature(s) Signed: 01/08/2021 3:56:47 PM By: Carlene Coria RN Entered By: Carlene Coria on 01/05/2021 10:00:06 Brannick, Herbie [place]  Lenna Sciara ([id number]) -------------------------------------------------------------------------------- [place] Details Patient Name: STOSH, SLAVICH. Date of Service: 01/05/2021 8:45 AM Medical Record Number: [id number] Patient Account Number: [id number] Date of Birth/Sex: March 06, 1975 (45 y.o. M) Treating RN: Carlene Coria Primary Care Iveliz Garay: SYSTEM, PCP Other Clinician: Referring Arlethia Basso: Niel Hummer Treating Seville Brick/Extender: Skipper Cliche in Treatment: 0 Active Inactive Electronic Signature(s) Signed: 02/05/2021 10:15:15 AM By: Gretta Cool, BSN, RN, CWS, Kim RN, BSN Signed: 03/02/2021 3:56:00 PM By: Carlene Coria RN Previous Signature: 01/08/2021 3:56:47 PM Version By: Carlene Coria RN Entered By: Gretta Cool, BSN, RN, CWS, Kim on 02/05/2021 10:15:14 [place], [name] ([id number]) -------------------------------------------------------------------------------- Pain Assessment Details Patient Name: Christian [name]. Date of Service: 01/05/2021 8:45 AM Medical Record Number: [id number] Patient Account Number: [id number] Date of Birth/Sex: 22-Jun-1975 (45 y.o. M) Treating RN: Carlene Coria Primary Care Amunique Neyra: SYSTEM, PCP Other Clinician: Referring Suan Pyeatt: Niel Hummer Treating Werner Labella/Extender: Skipper Cliche in Treatment: 0 Active Problems Location of Pain Severity and Description of Pain Patient Has Paino No Site Locations Pain Management and Medication Current Pain Management: Electronic Signature(s) Signed: 01/08/2021 3:56:47 PM By: Carlene Coria RN Entered By: Carlene Coria on 01/05/2021 09:27:40 Mathe, [name] ([id number]) -------------------------------------------------------------------------------- Patient/Caregiver Education Details Patient Name: Christian [name] Date of Service: 01/05/2021 8:45 AM Medical Record Number: [id number] Patient Account Number: [id number] Date of Birth/Gender: June 26, 1975 (45 y.o. M) Treating RN: Carlene Coria Primary Care Physician: SYSTEM, PCP Other Clinician: Referring Physician: Niel Hummer Treating Physician/Extender: Skipper Cliche in Treatment: 0 Education Assessment Education Provided To: Patient Education Topics Provided Wound/Skin Impairment: Methods: Explain/Verbal Responses: State content correctly Electronic Signature(s) Signed: 01/08/2021 3:56:47 PM By: Carlene Coria RN Entered By: Carlene Coria on 01/05/2021 10:07:52 [place], [name] ([id number]) -------------------------------------------------------------------------------- Wound Assessment Details Patient Name: Christian [name]. Date of Service: 01/05/2021 8:45 AM Medical Record Number: [id number] Patient Account Number: [id number] Date of Birth/Sex: 10-25-1975 (45 y.o. M) Treating RN: Carlene Coria Primary Care Anagabriela Jokerst: SYSTEM, PCP Other Clinician: Referring Jeryl Wilbourn: Niel Hummer Treating Hindy Perrault/Extender: Skipper Cliche in Treatment: 0 Wound Status Wound Number: 1 Primary Etiology: Pressure Ulcer Wound Location: Right, Posterior Upper Leg Wound Status: Open Wounding Event: Pressure Injury Comorbid History: Congestive Heart Failure, Hypertension Date Acquired: 10/21/2020 Weeks Of Treatment: 0 Clustered Wound:  No Photos Wound Measurements Length: (cm) 2.1 Width: (cm) 12 Depth: (cm) 0.2 Area: (cm) 19.792 Volume: (cm) 3.958 % Reduction in Area: 0% % Reduction in Volume: 0% Epithelialization: None Tunneling: No Undermining: No Wound Description Classification: Category/Stage III Exudate Amount: Medium Exudate Type: Serosanguineous Exudate Color: red, brown Foul Odor After Cleansing: No Slough/Fibrino No Wound Bed Granulation Amount: Large (67-100%) Exposed Structure Granulation Quality: Red Fascia Exposed: No Necrotic Amount: None Present (0%) Fat Layer (Subcutaneous Tissue) Exposed: Yes Tendon Exposed: No Muscle Exposed: No Joint Exposed: No Bone Exposed: No Electronic Signature(s) Signed: 01/05/2021 10:11:40 AM By: Carlene Coria RN Entered By: Carlene Coria on 01/05/2021 10:11:39 Blaydes, [name] ([id number]) -------------------------------------------------------------------------------- Vitals Details Patient Name: Christian [name]. Date of Service: 01/05/2021 8:45 AM Medical Record Number: [id number] Patient Account Number: [id number] Date of Birth/Sex: 01-20-1976 (45 y.o. M) Treating RN: Carlene Coria Primary Care Alda Gaultney: SYSTEM, PCP Other Clinician: Referring Ivonne Freeburg: Niel Hummer Treating Isaiah Cianci/Extender: Skipper Cliche in Treatment: 0 Vital Signs Time Taken: 09:27 Temperature (F): 98.3 Height (in): 75 Pulse (bpm): 84 Source: Stated Respiratory Rate (breaths/min): 18 Weight (lbs): 440 Blood Pressure (mmHg): 134/88 Source: Stated Reference Range: 80 - 120 mg / dl Body Mass Index (BMI): 55 Electronic Signature(s) Signed: 01/08/2021 3:56:47 PM By:  Yevonne Pax RN Entered By: Yevonne Pax on 01/05/2021 20:35:59

## 2021-01-08 NOTE — ED Triage Notes (Addendum)
44 yo male presents via [facility] from a homeless shelter for sudden onset SOB that started yesterday and worsened today, hx CHF. Pt does not normally wear oxygen. Pt started coughing worse last night. Was originally R/A for [employer]. at 80%, with NRB came up to 94%, but per EMS desats quickly off. ETCO2 at 45-48, temp 102, unable to swallow pills for EMS. Pt has R buttock wound (skin tear vs. wound). HR was 125 and pt reported he was "more swollen than normal". BG 138, EKG unremarkable. Hx: perf. bowel in August. Pt denies chest pain or pain in lungs at this time. Reports pain of 4 of 10 for R buttock wound and is being treated at a wound care clinic. Pt reports is compliant with Lasix. Denies any GI signs of vomiting or diarrhea.

## 2021-01-08 NOTE — Progress Notes (Signed)
[name], [name] ([id number]) Visit Report for 01/05/2021 Abuse/Suicide Risk Screen Details Patient Name: Christian [name], Christian [name]. Date of Service: 01/05/2021 8:45 AM Medical Record Number: [id number] Patient Account Number: [id number] Date of Birth/Sex: 1975/10/01 (45 y.o. M) Treating RN: Yevonne Pax Primary Care Amrit Cress: SYSTEM, PCP Other Clinician: Referring Rhina Kramme: Hartley Barefoot Treating Krissy Orebaugh/Extender: Rowan Blase in Treatment: 0 Abuse/Suicide Risk Screen Items Answer ABUSE RISK SCREEN: Has anyone close to you tried to hurt or harm you recentlyo No Do you feel uncomfortable with anyone in your familyo No Has anyone forced you do things that you didnot want to doo No Electronic Signature(s) Signed: 01/08/2021 3:56:47 PM By: Yevonne Pax RN Entered By: Yevonne Pax on 01/05/2021 09:30:11 Massmann, Marveen Reeks ([id number]) -------------------------------------------------------------------------------- Activities of Daily Living Details Patient Name: Christian [name]. Date of Service: 01/05/2021 8:45 AM Medical Record Number: [id number] Patient Account Number: [id number] Date of Birth/Sex: Jul 01, 1975 (45 y.o. M) Treating RN: Yevonne Pax Primary Care Demarquez Ciolek: SYSTEM, PCP Other Clinician: Referring Seabron Iannello: Hartley Barefoot Treating Malick Netz/Extender: Rowan Blase in Treatment: 0 Activities of Daily Living Items Answer Activities of Daily Living (Please select one for each item) Drive Automobile Completely Able Take Medications Completely Able Use Telephone Completely Able Care for Appearance Completely Able Use Toilet Completely Able Bath / Shower Completely Able Dress Self Completely Able Feed Self Completely Able Walk Completely Able Get In / Out Bed Completely Able Housework Completely Able Prepare Meals Completely Able Handle Money Completely Able Shop for Self Completely Able Electronic Signature(s) Signed: 01/08/2021 3:56:47 PM By: Yevonne Pax RN Entered By:  Yevonne Pax on 01/05/2021 09:30:34 [place], Marveen Reeks ([id number]) -------------------------------------------------------------------------------- Education Screening Details Patient Name: Christian [name] Date of Service: 01/05/2021 8:45 AM Medical Record Number: [id number] Patient Account Number: [id number] Date of Birth/Sex: Dec 08, 1975 (45 y.o. M) Treating RN: Yevonne Pax Primary Care Advika Mclelland: SYSTEM, PCP Other Clinician: Referring Carlyann Placide: Hartley Barefoot Treating Gasper Hopes/Extender: Rowan Blase in Treatment: 0 Learning Preferences/Education Level/Primary Language Learning Preference: Explanation Highest Education Level: College or Above Preferred Language: English Cognitive Barrier Language Barrier: No Translator Needed: No Memory Deficit: No Emotional Barrier: No Cultural/Religious Beliefs Affecting Medical Care: No Physical Barrier Impaired Vision: Yes Glasses Impaired Hearing: No Decreased Hand dexterity: No Knowledge/Comprehension Knowledge Level: Medium Comprehension Level: Medium Ability to understand written instructions: Medium Ability to understand verbal instructions: Medium Motivation Anxiety Level: Anxious Cooperation: Cooperative Education Importance: Acknowledges Need Interest in Health Problems: Asks Questions Perception: Coherent Willingness to Engage in Self-Management Medium Activities: Readiness to Engage in Self-Management Medium Activities: Electronic Signature(s) Signed: 01/08/2021 3:56:47 PM By: Yevonne Pax RN Entered By: Yevonne Pax on 01/05/2021 09:31:11 Pagnotta, Marveen Reeks ([id number]) -------------------------------------------------------------------------------- Fall Risk Assessment Details Patient Name: Christian [name]. Date of Service: 01/05/2021 8:45 AM Medical Record Number: [id number] Patient Account Number: [id number] Date of Birth/Sex: [birth year] (45 y.o. M) Treating RN: Yevonne Pax Primary Care Mikita Lesmeister: SYSTEM, PCP Other  Clinician: Referring Kerianne Gurr: Hartley Barefoot Treating Ravonda Brecheen/Extender: Rowan Blase in Treatment: 0 Fall Risk Assessment Items Have you had 2 or more falls in the last 12 monthso 0 No Have you had any fall that resulted in injury in the last 12 monthso 0 No FALLS RISK SCREEN History of falling - immediate or within 3 months 0 No Secondary diagnosis (Do you have 2 or more medical diagnoseso) 0 No Ambulatory aid None/bed rest/wheelchair/nurse 0 No Crutches/cane/walker 0 No Furniture 0 No Intravenous therapy Access/Saline/Heparin Lock 0 No Gait/Transferring Normal/ bed rest/ wheelchair 0 No Weak (short steps with or without shuffle,  stooped but able to lift head while walking, may 0 No seek support from furniture) Impaired (short steps with shuffle, may have difficulty arising from chair, head down, impaired 0 No balance) Mental Status Oriented to own ability 0 No Electronic Signature(s) Signed: 01/08/2021 3:56:47 PM By: Yevonne Pax RN Entered By: Yevonne Pax on 01/05/2021 09:31:19 Rehm, Marveen Reeks ([id number]) -------------------------------------------------------------------------------- Foot Assessment Details Patient Name: Christian [name]. Date of Service: 01/05/2021 8:45 AM Medical Record Number: [id number] Patient Account Number: [id number] Date of Birth/Sex: 05-29-1975 (45 y.o. M) Treating RN: Yevonne Pax Primary Care Cincere Deprey: SYSTEM, PCP Other Clinician: Referring Meher Kucinski: Hartley Barefoot Treating Zamirah Denny/Extender: Rowan Blase in Treatment: 0 Foot Assessment Items Site Locations + = Sensation present, - = Sensation absent, C = Callus, U = Ulcer R = Redness, W = Warmth, M = Maceration, PU = Pre-ulcerative lesion F = Fissure, S = Swelling, D = Dryness Assessment Right: Left: Other Deformity: No No Prior Foot Ulcer: No No Prior Amputation: No No Charcot Joint: No No Ambulatory Status: Ambulatory Without Help Gait: Steady Electronic  Signature(s) Signed: 01/08/2021 3:56:47 PM By: Yevonne Pax RN Entered By: Yevonne Pax on 01/05/2021 09:31:46 Ciani, Marveen Reeks ([id number]) -------------------------------------------------------------------------------- Nutrition Risk Screening Details Patient Name: Christian [name]. Date of Service: 01/05/2021 8:45 AM Medical Record Number: [id number] Patient Account Number: [id number] Date of Birth/Sex: 06-Feb-1976 (45 y.o. M) Treating RN: Yevonne Pax Primary Care Bernece Gall: SYSTEM, PCP Other Clinician: Referring Maili Shutters: Hartley Barefoot Treating Zyeir Dymek/Extender: Rowan Blase in Treatment: 0 Height (in): 75 Weight (lbs): 440 Body Mass Index (BMI): 55 Nutrition Risk Screening Items Score Screening NUTRITION RISK SCREEN: I have an illness or condition that made me change the kind and/or amount of food I eat 0 No I eat fewer than two meals per day 0 No I eat few fruits and vegetables, or milk products 0 No I have three or more drinks of beer, liquor or wine almost every day 0 No I have tooth or mouth problems that make it hard for me to eat 0 No I don't always have enough money to buy the food I need 0 No I eat alone most of the time 0 No I take three or more different prescribed or over-the-counter drugs a day 1 Yes Without wanting to, I have lost or gained 10 pounds in the last six months 0 No I am not always physically able to shop, cook and/or feed myself 0 No Nutrition Protocols Good Risk Protocol 0 No interventions needed Moderate Risk Protocol High Risk Proctocol Risk Level: Good Risk Score: 1 Electronic Signature(s) Signed: 01/08/2021 3:56:47 PM By: Yevonne Pax RN Entered By: Yevonne Pax on 01/05/2021 09:31:34

## 2021-01-08 NOTE — ED Provider Notes (Signed)
[facility] Emergency Department Provider Note  ____________________________________________   Event Date/Time   First MD Initiated Contact with Patient 01/08/21 1808     (approximate)  I have reviewed the triage vital signs and the nursing notes.   HISTORY  Chief Complaint Shortness of Breath    HPI [name] is a 45 y.o. male with past medical history of chronic diastolic heart failure, morbid obesity, history of previous cecal perforation requiring surgery, complicated by wound infection, here with shortness of breath.  The patient states that over the last 24 hours, he has had fairly acute onset of severe shortness of breath.  Patient states that he woke up today, feeling SOB with some mild body aches. He has been coughing. This has worsened throughout the day and is now severe. He felt lightheaded, unable to catch his breath, and began having chills. He subsequently called EMS from his shelter. Per EMS report, pt satting in the 80s on their arrival. Improved on NRB but very quickly returned to 80s when transitioning for EtCo2 measurement. Pt was febrile, tachycardic. Activated as a CODE SEPSIS. No abd pain, n/v/d. He does report he has a wound on his R buttocks that has been painful, but this is fairly chronic. Does not believe it has been draining.     Past Medical History:  Diagnosis Date   Allergic rhinitis    Anxiety    Depression    Elevated blood pressure reading without diagnosis of hypertension    History of chicken pox    Obesity     Patient Active Problem List   Diagnosis Date Noted   Pleural effusion 11/25/2020   Chronic diastolic CHF (congestive heart failure) (HCC) 11/25/2020   Depression 11/25/2020   Obesity hypoventilation syndrome (HCC) 10/17/2020   Acute on chronic respiratory failure with hypoxia and hypercapnia (HCC)    Bowel perforation (HCC)    Hypotension    Acute respiratory failure with hypoxia (HCC)    Weakness     Cellulitis of abdominal wall 10/10/2020   Impaired fasting glucose    Severe sepsis (HCC) 10/09/2020   Hyperglycemia 10/09/2020   AKI (acute kidney injury) (HCC) 10/09/2020   Pressure ulcer of back 10/09/2020   Pressure ulcer of buttock 10/09/2020   Essential hypertension 10/09/2020   Chest pain 04/06/2011   Morbid obesity (HCC)    Elevated blood pressure reading without diagnosis of hypertension     Past Surgical History:  Procedure Laterality Date   COLON SURGERY     TONSILLECTOMY AND ADENOIDECTOMY  03/01/1983    Prior to Admission medications   Medication Sig Start Date End Date Taking? Authorizing Provider  furosemide (LASIX) 20 MG tablet Take 2 tablets (40 mg total) by mouth 2 (two) times daily. 12/24/20   Regalado, Belkys A, MD  iron polysaccharides (NIFEREX) 150 MG capsule Take 1 capsule (150 mg total) by mouth once daily. 12/01/20   Danford, Earl Lites, MD  potassium chloride SA (KLOR-CON) 20 MEQ tablet Take 1 tablet (20 mEq total) by mouth 2 (two) times daily. 12/24/20   Regalado, Belkys A, MD  sertraline (ZOLOFT) 50 MG tablet Take 1 tablet (50 mg total) by mouth once daily. 12/24/20   Regalado, Belkys A, MD  vitamin B-12 (CYANOCOBALAMIN) 100 MCG tablet Take 1 tablet (100 mcg total) by mouth once daily. 12/24/20   Regalado, Prentiss Bells, MD    Allergies Shellfish allergy  Family History  Problem Relation Age of Onset   Stroke Mother  Diabetes Mother        pre-diabetes   Heart disease Mother        CHF, pacer   Cancer Mother        lymphoma   Coronary artery disease Father 75       MI   Hypertension Father    Hyperlipidemia Father    Diabetes Father    Other Sister        unknown medical history   Stroke Paternal Grandmother    Diabetes Paternal Grandmother    Heart disease Paternal Grandfather    Parkinson's disease Paternal Grandfather     Social History Social History   Tobacco Use   Smoking status: Never   Smokeless tobacco: Never   Tobacco  comments:    Living in homeless shelter  Vaping Use   Vaping Use: Never used  Substance Use Topics   Alcohol use: Yes    Alcohol/week: 2.0 standard drinks    Types: 2 Cans of beer per week    Comment: 1-2 beers 1-2 days a week   Drug use: Never    Review of Systems  Review of Systems  Unable to perform ROS: Severe respiratory distress  Constitutional:  Positive for chills and fever.  Respiratory:  Positive for cough and shortness of breath.   Cardiovascular:  Positive for leg swelling.  Neurological:  Positive for weakness and light-headedness.    ____________________________________________  PHYSICAL EXAM:      VITAL SIGNS: ED Triage Vitals  Enc Vitals Group     BP --      Pulse Rate 01/08/21 1803 (!) 123     Resp 01/08/21 1803 18     Temp 01/08/21 1803 (!) 100.8 F (38.2 C)     Temp Source 01/08/21 1803 Oral     SpO2 --      Weight 01/08/21 1759 (!) 462 lb 3.2 oz (209.7 kg)     Height 01/08/21 1759 6\' 1"  (1.854 m)     Head Circumference --      Peak Flow --      Pain Score 01/08/21 1802 4     Pain Loc --      Pain Edu? --      Excl. in GC? --      Physical Exam Vitals and nursing note reviewed.  Constitutional:      General: He is not in acute distress.    Appearance: He is well-developed. He is ill-appearing.  HENT:     Head: Normocephalic and atraumatic.  Eyes:     Conjunctiva/sclera: Conjunctivae normal.  Cardiovascular:     Rate and Rhythm: Regular rhythm. Tachycardia present.     Heart sounds: Normal heart sounds. No murmur heard.   No friction rub.  Pulmonary:     Effort: Pulmonary effort is normal. Tachypnea present. No respiratory distress.     Breath sounds: Examination of the right-upper field reveals wheezing. Examination of the left-upper field reveals wheezing. Examination of the right-middle field reveals wheezing and rales. Examination of the left-middle field reveals wheezing and rales. Examination of the right-lower field reveals wheezing  and rales. Examination of the left-lower field reveals wheezing and rales. Decreased breath sounds, wheezing and rales present.  Abdominal:     General: There is no distension.     Palpations: Abdomen is soft.     Tenderness: There is no abdominal tenderness.  Musculoskeletal:     Cervical back: Neck supple.     Right lower leg: Edema (  2+ pitting) present.     Left lower leg: Edema (2+ pitting) present.  Skin:    General: Skin is warm.     Capillary Refill: Capillary refill takes less than 2 seconds.     Comments: Superficial ulceration R buttocks area, likely pressure wound vs infected skin tear, with marked surrounding erythema and warmth extending to medial and distal thigh.  Neurological:     Mental Status: He is alert and oriented to person, place, and time.     Motor: No abnormal muscle tone.      ____________________________________________   LABS (all labs ordered are listed, but only abnormal results are displayed)  Labs Reviewed  RESP PANEL BY RT-PCR (FLU A&B, COVID) ARPGX2 - Abnormal; Notable for the following components:      Result Value   Influenza A by PCR POSITIVE (*)    All other components within normal limits  COMPREHENSIVE METABOLIC PANEL - Abnormal; Notable for the following components:   Sodium 134 (*)    Glucose, Bld 140 (*)    Calcium 8.4 (*)    All other components within normal limits  CBC WITH DIFFERENTIAL/PLATELET - Abnormal; Notable for the following components:   Hemoglobin 12.8 (*)    MCH 25.3 (*)    RDW 15.6 (*)    Neutro Abs 8.9 (*)    Lymphs Abs 0.5 (*)    All other components within normal limits  URINALYSIS, COMPLETE (UACMP) WITH MICROSCOPIC - Abnormal; Notable for the following components:   Color, Urine YELLOW (*)    APPearance CLEAR (*)    Hgb urine dipstick SMALL (*)    All other components within normal limits  BRAIN NATRIURETIC PEPTIDE - Abnormal; Notable for the following components:   B Natriuretic Peptide 111.6 (*)    All  other components within normal limits  BLOOD GAS, VENOUS - Abnormal; Notable for the following components:   pO2, Ven 61.0 (*)    Bicarbonate 28.5 (*)    Acid-Base Excess 3.4 (*)    All other components within normal limits  CULTURE, BLOOD (ROUTINE X 2)  CULTURE, BLOOD (ROUTINE X 2)  URINE CULTURE  LACTIC ACID, PLASMA  PROTIME-INR  APTT  LACTIC ACID, PLASMA    ____________________________________________  EKG: Sinus tachycardia, VR 123. PR 120, QRS 125, QTc 498. IVCD, consider atypical RBBB. No acute ST elevations. ________________________________________  RADIOLOGY All imaging, including plain films, CT scans, and ultrasounds, independently reviewed by me, and interpretations confirmed via formal radiology reads.  ED MD interpretation:   CXR: Diffuse interstitial opacities and b/l mid to lower lung opacities, R>L, pulm edema likely. Small R pleural effusion.  Official radiology report(s): DG Chest Port 1 View  Result Date: 01/08/2021 CLINICAL DATA:  Questionable sepsis - evaluate for abnormality EXAM: PORTABLE CHEST 1 VIEW COMPARISON:  Chest radiograph 12/09/2020 FINDINGS: Unchanged cardiomediastinal silhouette. There are diffuse interstitial opacities and right greater than left mid to lower lung opacities. There is a small right pleural effusion. No visible pneumothorax. There is no acute osseous abnormality. IMPRESSION: Diffuse interstitial opacities and bilateral mid to lower lung opacities, right greater than left, favored to represent pulmonary edema. Multifocal infection is possible. Small right pleural effusion. Electronically Signed   By: Caprice Renshaw M.D.   On: 01/08/2021 18:48    ____________________________________________  PROCEDURES   Procedure(s) performed (including Critical Care):  .Critical Care Performed by: Shaune Pollack, MD Authorized by: Shaune Pollack, MD   Critical care provider statement:    Critical care time (minutes):  35   Critical care was  necessary to treat or prevent imminent or life-threatening deterioration of the following conditions:  Cardiac failure, circulatory failure and sepsis   Critical care was time spent personally by me on the following activities:  Blood draw for specimens, development of treatment plan with patient or surrogate, discussions with consultants, evaluation of patient's response to treatment, examination of patient, ordering and performing treatments and interventions, ordering and review of radiographic studies, pulse oximetry, ordering and review of laboratory studies, re-evaluation of patient's condition, review of old charts and obtaining history from patient or surrogate .1-3 Lead EKG Interpretation Performed by: Shaune Pollack, MD Authorized by: Shaune Pollack, MD     Interpretation: non-specific     ECG rate:  90-110   ECG rate assessment: tachycardic     Rhythm: sinus tachycardia     Ectopy: none     Conduction: normal   Comments:     Indication: sepsis  ____________________________________________  INITIAL IMPRESSION / MDM / ASSESSMENT AND PLAN / ED COURSE  As part of my medical decision making, I reviewed the following data within the electronic MEDICAL RECORD NUMBER Nursing notes reviewed and incorporated, Old chart reviewed, Notes from prior ED visits, and [place] Controlled Substance Database       *Christian [name] was evaluated in Emergency Department on 01/08/2021 for the symptoms described in the history of present illness. He was evaluated in the context of the global COVID-19 pandemic, which necessitated consideration that the patient might be at risk for infection with the SARS-CoV-2 virus that causes COVID-19. Institutional protocols and algorithms that pertain to the evaluation of patients at risk for COVID-19 are in a state of rapid change based on information released by regulatory bodies including the CDC and federal and state organizations. These policies and algorithms were  followed during the patient's care in the ED.  Some ED evaluations and interventions may be delayed as a result of limited staffing during the pandemic.*     Medical Decision Making:  45 yo M here with resp distress, hypoxia, and fever. Activated as sepsis protocol on arrival, placed on BIPAP for WOB. Exam is concerning for CHF, PNA, though pt also has significant RLE/buttocks cellulitis and warmth. Sepsis protocol initiated with broad-spectrum ABX, but fluids held 2/2 fluid overloaded/anasarca on exam. Reviewed prior records - pt also has h/o recent cecum perforation repaired at [facility], but has been healing well per Dr. Geoffery Lyons recent note and wound does not appear infected at this time.   Labs overall fairly reassuring. No significant leukocytosis, anemia. Renal function normal. Normal LA. UA unremarkable. EKG nonischemic. BNP pending. CXR reviewed, shows multifocal edema vs infection.  Clinically, pt presenting more as acute CHF and resp failure on BIPAP, though he is also flu+ and has cellulitis of R buttocks. Will tx empirically, f/u CT. If BNP elevated and pt remains HDS, feel a dose of lasix would be very beneficial. Discussed with intensivist Cheryll Cockayne, who recommends reassessing after CT and lasix. Signed out to Dr. Lenard Lance.   ____________________________________________  FINAL CLINICAL IMPRESSION(S) / ED DIAGNOSES  Final diagnoses:  Acute respiratory failure with hypoxemia (HCC)  Influenza A  Sepsis without acute organ dysfunction, due to unspecified organism (HCC)  Cellulitis of buttock     MEDICATIONS GIVEN DURING THIS VISIT:  Medications  oseltamivir (TAMIFLU) capsule 75 mg (75 mg Oral Given 01/08/21 2118)  ceFEPIme (MAXIPIME) 2 g in sodium chloride 0.9 % 100 mL IVPB (0 g Intravenous Stopped 01/08/21 1902)  metroNIDAZOLE (FLAGYL) IVPB 500 mg (0 mg Intravenous Stopped 01/08/21 1930)  vancomycin (VANCOCIN) IVPB 1000 mg/200 mL premix (0 mg Intravenous Stopped 01/08/21 2006)   acetaminophen (TYLENOL) tablet 650 mg (650 mg Oral Given 01/08/21 1903)  furosemide (LASIX) injection 60 mg (60 mg Intravenous Given 01/08/21 2118)  iohexol (OMNIPAQUE) 300 MG/ML solution 100 mL (100 mLs Intravenous Contrast Given 01/08/21 2111)     ED Discharge Orders     None        Note:  This document was prepared using Dragon voice recognition software and may include unintentional dictation errors.   Shaune Pollack, MD 01/08/21 2128

## 2021-01-08 NOTE — ED Notes (Signed)
Noted clear bilat breat sounds for this pt , RT at bedside, kept pt at nasal cannula 4lPM/[place] at this time .. tolerated. Pt denies SOB at this time T=99.1 oral

## 2021-01-08 NOTE — Sepsis Progress Note (Signed)
[facility] following sepsis protocol 

## 2021-01-08 NOTE — Progress Notes (Signed)
Christian [name], Christian [name] ([id number]) Visit Report for 01/05/2021 Chief Complaint Document Details Patient Name: Christian [name], Christian [name]. Date of Service: 01/05/2021 8:45 AM Medical Record Number: [id number] Patient Account Number: [id number] Date of Birth/Sex: 03-Jan-1976 (45 y.o. M) Treating RN: Carlene Coria Primary Care Provider: SYSTEM, PCP Other Clinician: Referring Provider: Niel Hummer Treating Provider/Extender: Skipper Cliche in Treatment: 0 Information Obtained from: Patient Chief Complaint Pressure ulcer right upper leg Electronic Signature(s) Signed: 01/05/2021 9:44:05 AM By: Worthy Keeler PA-C Entered By: Worthy Keeler on 01/05/2021 09:44:05 Christian [name], [name] ([id number]) -------------------------------------------------------------------------------- HPI Details Patient Name: Christian [name] Date of Service: 01/05/2021 8:45 AM Medical Record Number: [id number] Patient Account Number: [id number] Date of Birth/Sex: [birth year] (45 y.o. M) Treating RN: Carlene Coria Primary Care Provider: SYSTEM, PCP Other Clinician: Referring Provider: Niel Hummer Treating Provider/Extender: Skipper Cliche in Treatment: 0 History of Present Illness HPI Description: 01/05/2021 upon evaluation today patient presents for initial inspection here in the clinic concerning a pressure ulcer that he has in the right posterior thigh location. 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 Again this is not too deep nor too large in my opinion but I think the biggest issue is good to be simply that he is can have a difficult time putting the dressing on as he has a hard time just standing for long periods of time. He is also living at the homeless shelter which makes things that much worse as far as caring for this wound is concerned. Patient does have a history of hypertension, congestive heart failure, obesity, and recently a perforated cecum which was when he became septic and was in  the hospital. Electronic Signature(s) Signed: 01/05/2021 5:07:32 PM By: Worthy Keeler PA-C Entered By: Worthy Keeler on 01/05/2021 17:07:31 Christian [name], [name] ([id number]) -------------------------------------------------------------------------------- Physical Exam Details Patient Name: Christian [name]. Date of Service: 01/05/2021 8:45 AM Medical Record Number: [id number] Patient Account Number: [id number] Date of Birth/Sex: 1975/12/07 (45 y.o. M) Treating RN: Carlene Coria Primary Care Provider: SYSTEM, PCP Other Clinician: Referring Provider: Niel Hummer Treating Provider/Extender: Skipper Cliche in Treatment: 0 Constitutional sitting or standing blood pressure is within target range for patient.. pulse regular and within target range for patient.Marland Kitchen respirations regular, non- labored and within target range for patient.Marland Kitchen temperature within target range for patient.. Obese and well-hydrated in no acute distress. Eyes conjunctiva clear no eyelid edema noted. pupils equal round and reactive to light and accommodation. Ears, Nose, Mouth, and Throat no gross abnormality of ear auricles or external auditory canals. normal hearing noted during conversation. mucus membranes moist. Respiratory normal breathing without difficulty. Musculoskeletal normal gait and posture. no significant deformity or arthritic changes, no loss or range of motion, no clubbing. Psychiatric this patient is able to make decisions and demonstrates good insight into disease process. Alert and Oriented x 3. pleasant and cooperative. Notes Upon inspection patient's wound bed actually showed signs of fairly good granulation and epithelization at this point in my opinion. I think the biggest issue is that he does not really have any way to get appropriate dressings on this area. I think we can try to help him figure out a way to do this but in the end I think it is can be difficult for him to be perfectly  honest. Electronic Signature(s) Signed: 01/05/2021 5:08:42 PM By: Worthy Keeler PA-C Previous Signature: 01/05/2021 5:08:22 PM Version By: Worthy Keeler PA-C Entered By: Worthy Keeler  on 01/05/2021 17:08:42 Christian [name], [name] ([id number]) -------------------------------------------------------------------------------- Physician Orders Details Patient Name: Christian [name], Christian [name]. Date of Service: 01/05/2021 8:45 AM Medical Record Number: [id number] Patient Account Number: [id number] Date of Birth/Sex: 1975/09/21 (45 y.o. M) Treating RN: Carlene Coria Primary Care Provider: SYSTEM, PCP Other Clinician: Referring Provider: Niel Hummer Treating Provider/Extender: Skipper Cliche in Treatment: 0 Verbal / Phone Orders: No Diagnosis Coding ICD-10 Coding Code Description L89.002 Pressure ulcer of unspecified elbow, stage 2 E66.01 Morbid (severe) obesity due to excess calories I10 Essential (primary) hypertension I50.42 Chronic combined systolic (congestive) and diastolic (congestive) heart failure Follow-up Appointments Wound #1 Right Upper Leg o Return Appointment in 1 week. Bathing/ Shower/ Hygiene o May shower with wound dressing protected with water repellent cover or cast protector. Edema Control - Lymphedema / Segmental Compressive Device / Other o Elevate, Exercise Daily and Avoid Standing for Long Periods of Time. o Elevate legs to the level of the heart and pump ankles as often as possible o Elevate leg(s) parallel to the floor when sitting. Wound Treatment Wound #1 - Upper Leg Wound Laterality: Right Cleanser: Soap and Water Every Other Day/30 Days Discharge Instructions: Gently cleanse wound with antibacterial soap, rinse and pat dry prior to dressing wounds Primary Dressing: Calcium Alginate Dressing, 6x6 (in/in) Every Other Day/30 Days Secondary Dressing: ABD Pad 5x9 (in/in) Every Other Day/30 Days Discharge Instructions: Cover with ABD pad Secured With: 23M  Medipore H Soft Cloth Surgical Tape, 2x2 (in/yd) Every Other Day/30 Days Electronic Signature(s) Signed: 01/05/2021 6:00:47 PM By: Worthy Keeler PA-C Signed: 01/08/2021 3:56:47 PM By: Carlene Coria RN Entered By: Carlene Coria on 01/05/2021 10:02:59 Senner, [name] ([id number]) -------------------------------------------------------------------------------- Problem List Details Patient Name: Christian [name]. Date of Service: 01/05/2021 8:45 AM Medical Record Number: [id number] Patient Account Number: [id number] Date of Birth/Sex: 05-08-1975 (45 y.o. M) Treating RN: Carlene Coria Primary Care Provider: SYSTEM, PCP Other Clinician: Referring Provider: Niel Hummer Treating Provider/Extender: Skipper Cliche in Treatment: 0 Active Problems ICD-10 Encounter Code Description Active Date MDM Diagnosis L89.003 Pressure ulcer of unspecified elbow, stage 3 01/05/2021 No Yes E66.01 Morbid (severe) obesity due to excess calories 01/05/2021 No Yes I10 Essential (primary) hypertension 01/05/2021 No Yes I50.42 Chronic combined systolic (congestive) and diastolic (congestive) heart 01/05/2021 No Yes failure Inactive Problems Resolved Problems Electronic Signature(s) Signed: 01/05/2021 5:06:42 PM By: Worthy Keeler PA-C Previous Signature: 01/05/2021 9:43:23 AM Version By: Worthy Keeler PA-C Entered By: Worthy Keeler on 01/05/2021 17:06:42 Kauer, [name] ([id number]) -------------------------------------------------------------------------------- Progress Note Details Patient Name: Christian [name]. Date of Service: 01/05/2021 8:45 AM Medical Record Number: [id number] Patient Account Number: [id number] Date of Birth/Sex: 1975-07-23 (45 y.o. M) Treating RN: Carlene Coria Primary Care Provider: SYSTEM, PCP Other Clinician: Referring Provider: Niel Hummer Treating Provider/Extender: Skipper Cliche in Treatment: 0 Subjective Chief Complaint Information obtained from Patient Pressure ulcer  right upper leg History of Present Illness (HPI) 01/05/2021 upon evaluation today patient presents for initial inspection here in the clinic concerning a pressure ulcer that he has in the right posterior thigh location. 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 Again this is not too deep nor too large in my opinion but I think the biggest issue is good to be simply that he is can have a difficult time putting the dressing on as he has a hard time just standing for long periods of time. He is also living at the homeless shelter which makes things that  much worse as far as caring for this wound is concerned. Patient does have a history of hypertension, congestive heart failure, obesity, and recently a perforated cecum which was when he became septic and was in the hospital. Patient History Information obtained from Patient. Allergies Shellfish Containing Products Social History Never smoker, Marital Status - Single, Alcohol Use - Rarely, Drug Use - No History, Caffeine Use - Daily. Medical History Cardiovascular Patient has history of Congestive Heart Failure, Hypertension Objective Constitutional sitting or standing blood pressure is within target range for patient.. pulse regular and within target range for patient.Marland Kitchen respirations regular, non- labored and within target range for patient.Marland Kitchen temperature within target range for patient.. Obese and well-hydrated in no acute distress. Vitals Time Taken: 9:27 AM, Height: 75 in, Source: Stated, Weight: 440 lbs, Source: Stated, BMI: 55, Temperature: 98.3 F, Pulse: 84 bpm, Respiratory Rate: 18 breaths/min, Blood Pressure: 134/88 mmHg. Eyes conjunctiva clear no eyelid edema noted. pupils equal round and reactive to light and accommodation. Ears, Nose, Mouth, and Throat no gross abnormality of ear auricles or external auditory canals. normal hearing noted during conversation. mucus membranes moist. Respiratory normal  breathing without difficulty. Musculoskeletal normal gait and posture. no significant deformity or arthritic changes, no loss or range of motion, no clubbing. Psychiatric this patient is able to make decisions and demonstrates good insight into disease process. Alert and Oriented x 3. pleasant and cooperative. Nova, Marveen Reeks ([id number]) General Notes: Upon inspection patient's wound bed actually showed signs of fairly good granulation and epithelization at this point in my opinion. I think the biggest issue is that he does not really have any way to get appropriate dressings on this area. I think we can try to help him figure out a way to do this but in the end I think it is can be difficult for him to be perfectly honest. Integumentary (Hair, Skin) Wound #1 status is Open. Original cause of wound was Pressure Injury. The date acquired was: 10/21/2020. The wound is located on the Right,Posterior Upper Leg. The wound measures 2.1cm length x 12cm width x 0.2cm depth; 19.792cm^2 area and 3.958cm^3 volume. There is Fat Layer (Subcutaneous Tissue) exposed. There is no tunneling or undermining noted. There is a medium amount of serosanguineous drainage noted. There is large (67-100%) red granulation within the wound bed. There is no necrotic tissue within the wound bed. Assessment Active Problems ICD-10 Pressure ulcer of unspecified elbow, stage 3 Morbid (severe) obesity due to excess calories Essential (primary) hypertension Chronic combined systolic (congestive) and diastolic (congestive) heart failure Plan Follow-up Appointments: Wound #1 Right Upper Leg: Return Appointment in 1 week. Bathing/ Shower/ Hygiene: May shower with wound dressing protected with water repellent cover or cast protector. Edema Control - Lymphedema / Segmental Compressive Device / Other: Elevate, Exercise Daily and Avoid Standing for Long Periods of Time. Elevate legs to the level of the heart and pump ankles as  often as possible Elevate leg(s) parallel to the floor when sitting. WOUND #1: - Upper Leg Wound Laterality: Right Cleanser: Soap and Water Every Other Day/30 Days Discharge Instructions: Gently cleanse wound with antibacterial soap, rinse and pat dry prior to dressing wounds Primary Dressing: Calcium Alginate Dressing, 6x6 (in/in) Every Other Day/30 Days Secondary Dressing: ABD Pad 5x9 (in/in) Every Other Day/30 Days Discharge Instructions: Cover with ABD pad Secured With: 28M Medipore H Soft Cloth Surgical Tape, 2x2 (in/yd) Every Other Day/30 Days 1. Upon inspection patient's wound bed actually showed signs of again fairly decent granulation  although he did have some debris in the wound this was just here and external debris from not being able to put a dressing on the area. Subsequently I was able to clean this away with saline and gauze the wound bed appears to be fairly good in my opinion. My suggestion currently is good to be that we utilize a silver alginate dressing I think we have something we can use for him here in the clinic to help with dressing supplies. We also taught him how to try to put this dressing on himself. 2. I am also can recommend he should change this at least 3 times a week although daily is probably more likely to be necessary just due to the location. 3. I am also can recommend that an ABD pad over top of the alginate is probably the best way to go and then he securing this with tape. 4. I am also can recommend that we have the patient try to offload is much as possible though to be honest this is good to be difficult due to the fact that he does not even have anywhere to stay during the day being that is at the homeless shelter he has to be out in the morning. We will see patient back for reevaluation in 1 week here in the clinic. If anything worsens or changes patient will contact our office for additional recommendations. Electronic Signature(s) Signed:  01/05/2021 5:09:56 PM By: Worthy Keeler PA-C Entered By: Worthy Keeler on 01/05/2021 17:09:56 Cieslinski, [name] ([id number]) -------------------------------------------------------------------------------- ROS/PFSH Details Patient Name: Christian [name] Date of Service: 01/05/2021 8:45 AM Medical Record Number: [id number] Patient Account Number: [id number] Date of Birth/Sex: 10-23-1975 (45 y.o. M) Treating RN: Carlene Coria Primary Care Provider: SYSTEM, PCP Other Clinician: Referring Provider: Niel Hummer Treating Provider/Extender: Skipper Cliche in Treatment: 0 Information Obtained From Patient Cardiovascular Medical History: Positive for: Congestive Heart Failure; Hypertension Immunizations Pneumococcal Vaccine: Received Pneumococcal Vaccination: No Implantable Devices None Family and Social History Never smoker; Marital Status - Single; Alcohol Use: Rarely; Drug Use: No History; Caffeine Use: Daily; Financial Concerns: No; Food, Clothing or Shelter Needs: No; Support System Lacking: No; Transportation Concerns: No Electronic Signature(s) Signed: 01/05/2021 6:00:47 PM By: Worthy Keeler PA-C Signed: 01/08/2021 3:56:47 PM By: Carlene Coria RN Entered By: Carlene Coria on 01/05/2021 09:30:00 Fosberg, [name] ([id number]) -------------------------------------------------------------------------------- SuperBill Details Patient Name: Christian [name]. Date of Service: 01/05/2021 Medical Record Number: [id number] Patient Account Number: [id number] Date of Birth/Sex: 05-07-1975 (45 y.o. M) Treating RN: Carlene Coria Primary Care Provider: SYSTEM, PCP Other Clinician: Referring Provider: Niel Hummer Treating Provider/Extender: Skipper Cliche in Treatment: 0 Diagnosis Coding ICD-10 Codes Code Description L89.003 Pressure ulcer of unspecified elbow, stage 3 E66.01 Morbid (severe) obesity due to excess calories I10 Essential (primary) hypertension I50.42 Chronic combined  systolic (congestive) and diastolic (congestive) heart failure Facility Procedures CPT4 Code: [id number] Description: 99214 - WOUND CARE VISIT-LEV 4 EST PT Modifier: Quantity: 1 Physician Procedures CPT4 Code: [id number] Description: [id number] - WC PHYS LEVEL 4 - NEW PT Modifier: Quantity: 1 CPT4 Code: Description: ICD-10 Diagnosis Description L89.003 Pressure ulcer of unspecified elbow, stage 3 E66.01 Morbid (severe) obesity due to excess calories I10 Essential (primary) hypertension I50.42 Chronic combined systolic (congestive) and diastolic  (congestive) heart Modifier: failure Quantity: Electronic Signature(s) Signed: 01/05/2021 5:10:20 PM By: Worthy Keeler PA-C Entered By: Worthy Keeler on 01/05/2021 17:10:20

## 2021-01-08 NOTE — ED Notes (Signed)
Requested RT to recheck Bipap post PO meds to check seal because pt reporting discomfort

## 2021-01-08 NOTE — ED Notes (Signed)
Pt transferred to bariatric bed at this time,  noted dyspnea w/ exertion otherwise in NAD .

## 2021-01-08 NOTE — Progress Notes (Signed)
PHARMACY -  BRIEF ANTIBIOTIC NOTE   Pharmacy has received consult(s) for vancomycin and cefepime from an ED provider.  The patient's profile has been reviewed for ht/wt/allergies/indication/available labs.    One time order(s) placed by MD for Vancomycin 1 gm and cefepime 2 gm  Further antibiotics/pharmacy consults should be ordered by admitting physician if indicated.                       Thank you, Ingvald Theisen A 01/08/2021  6:23 PM

## 2021-01-08 NOTE — ED Notes (Signed)
Pt to CT at this time.

## 2021-01-08 NOTE — H&P (Signed)
History and Physical    Christian [name] KWI:[id number] DOB: Jul 24, 1975 DOA: 01/08/2021  PCP: Pcp, No    Patient coming from:  Homeless shelter   Chief Complaint:  SOB  HPI:  Christian [name] is a 45 y.o. male seen in ed with complaints of sob, that started yesterday  Shortness of breath has progressed and is worse today along with cough when seen by EMS patient was found to be 80% on O2 sats at room air and was started on a nonrebreather.  Patient also had a fever and had a cellulitic area on his right buttock that is being also treated by the wound clinic for one month .  Patient initially with respiratory distress and respiratory failure and hypoxia and required BiPAP, code sepsis was activated.  Patient was given vancomycin, cefepime and metronidazole along with Tamiflu.  Pt has past medical history of morbid obesity, shellfish allergy, anxiety, depression,  ED Course: In the emergency room patient is alert awake oriented and off BiPAP and hospitalist requested to admit patient for shortness of breath and respiratory failure.  Vitals:   01/08/21 1930 01/08/21 2100 01/08/21 2200 01/08/21 2300  BP: (!) 146/120 (!) 121/59 138/74 122/61  Pulse: (!) 114 99 96 98  Resp: (!) 34 (!) 25 (!) 25 (!) 24  Temp:  99.1 F (37.3 C)    TempSrc:  Oral    SpO2: 99% 97% 91% 100%  Weight:      Height:      Patient meets sepsis criteria in the emergency room.  And empirically has received appropriate antibiotics including vancomycin cefepime metronidazole and Tamiflu for his respiratory panel test which is flu positive and COVID-negative.  Initial EKG shows sinus tachycardia with a rate of 123, QTC of 498 and no ST-T wave changes.  Initial chest x-ray imaging shows diffuse interstitial pattern opacities, mid to lower lung opacities, right more than left pulmonary edema and a small right-sided pleural effusion.  Initial ABG shows a PO2 of 61 PCO2 of 40 5 PM pH of 7.41, CMP shows glucose of 140  sodium 134 normal LFTs, normal creatinine, CBC shows normal white count of 10 hemoglobin of 12.8, RDW of 15.6 and platelet count of 333.  Today's hemoglobin of 12.8 was 12.4 on 12 October and patient has been anemic since October 10, 2020.   Review of Systems:  Review of Systems  Constitutional:  Positive for chills, fever and malaise/fatigue.  HENT: Negative.    Eyes: Negative.   Respiratory:  Positive for cough and shortness of breath.   Cardiovascular: Negative.   Gastrointestinal: Negative.   Genitourinary: Negative.   Musculoskeletal: Negative.   Skin: Negative.   Neurological: Negative.   Endo/Heme/Allergies: Negative.   Psychiatric/Behavioral: Negative.      Past Medical History:  Diagnosis Date   Allergic rhinitis    Anxiety    Depression    Elevated blood pressure reading without diagnosis of hypertension    History of chicken pox    Obesity     Past Surgical History:  Procedure Laterality Date   COLON SURGERY     TONSILLECTOMY AND ADENOIDECTOMY  03/01/1983     reports that he has never smoked. He has never used smokeless tobacco. He reports current alcohol use of about 2.0 standard drinks per week. He reports that he does not use drugs.  Allergies  Allergen Reactions   Shellfish Allergy Nausea Only    Family History  Problem Relation Age of Onset   Stroke  Mother    Diabetes Mother        pre-diabetes   Heart disease Mother        CHF, pacer   Cancer Mother        lymphoma   Coronary artery disease Father 50       MI   Hypertension Father    Hyperlipidemia Father    Diabetes Father    Other Sister        unknown medical history   Stroke Paternal Grandmother    Diabetes Paternal Grandmother    Heart disease Paternal Grandfather    Parkinson's disease Paternal Grandfather     Prior to Admission medications   Medication Sig Start Date End Date Taking? Authorizing Provider  furosemide (LASIX) 20 MG tablet Take 2 tablets (40 mg total) by mouth 2  (two) times daily. 12/24/20   Regalado, Belkys A, MD  iron polysaccharides (NIFEREX) 150 MG capsule Take 1 capsule (150 mg total) by mouth once daily. 12/01/20   Danford, Earl Lites, MD  potassium chloride SA (KLOR-CON) 20 MEQ tablet Take 1 tablet (20 mEq total) by mouth 2 (two) times daily. 12/24/20   Regalado, Belkys A, MD  sertraline (ZOLOFT) 50 MG tablet Take 1 tablet (50 mg total) by mouth once daily. 12/24/20   Regalado, Belkys A, MD  vitamin B-12 (CYANOCOBALAMIN) 100 MCG tablet Take 1 tablet (100 mcg total) by mouth once daily. 12/24/20   [name], MD    Physical Exam: Vitals:   01/08/21 1930 01/08/21 2100 01/08/21 2200 01/08/21 2300  BP: (!) 146/120 (!) 121/59 138/74 122/61  Pulse: (!) 114 99 96 98  Resp: (!) 34 (!) 25 (!) 25 (!) 24  Temp:  99.1 F (37.3 C)    TempSrc:  Oral    SpO2: 99% 97% 91% 100%  Weight:      Height:       Physical Exam Vitals and nursing note reviewed.  Constitutional:      General: He is not in acute distress.    Appearance: He is obese. He is not ill-appearing, toxic-appearing or diaphoretic.  HENT:     Head: Normocephalic and atraumatic.     Right Ear: External ear normal.     Left Ear: External ear normal.     Nose: Nose normal.     Mouth/Throat:     Mouth: Mucous membranes are dry.  Eyes:     Extraocular Movements: Extraocular movements intact.     Pupils: Pupils are equal, round, and reactive to light.  Neck:     Vascular: No carotid bruit.  Cardiovascular:     Rate and Rhythm: Normal rate and regular rhythm.     Pulses: Normal pulses.     Heart sounds: Normal heart sounds.  Pulmonary:     Effort: Pulmonary effort is normal.     Breath sounds: Normal breath sounds.  Abdominal:     General: Bowel sounds are normal. There is no distension.     Palpations: Abdomen is soft. There is no mass.     Tenderness: There is no abdominal tenderness. There is no guarding.     Hernia: No hernia is present.  Musculoskeletal:     Right  lower leg: No edema.     Left lower leg: No edema.  Skin:    General: Skin is warm.  Neurological:     General: No focal deficit present.     Mental Status: He is alert and oriented to person, place,  and time.  Psychiatric:        Mood and Affect: Mood normal.        Behavior: Behavior normal.     Labs on Admission: I have personally reviewed following labs and imaging studies  No results for input(s): CKTOTAL, CKMB, TROPONINI in the last 72 hours. Lab Results  Component Value Date   WBC 10.0 01/08/2021   HGB 12.8 (L) 01/08/2021   HCT 40.9 01/08/2021   MCV 80.8 01/08/2021   PLT 333 01/08/2021    Recent Labs  Lab 01/08/21 1810  NA 134*  K 3.8  CL 100  CO2 27  BUN 13  CREATININE 1.11  CALCIUM 8.4*  PROT 7.5  BILITOT 0.8  ALKPHOS 69  ALT 17  AST 18  GLUCOSE 140*   Lab Results  Component Value Date   CHOL 153 12/24/2020   HDL 42 12/24/2020   LDLCALC 88 12/24/2020   TRIG 126 12/24/2020   Lab Results  Component Value Date   DDIMER 1.64 (H) 01/08/2021   Invalid input(s): POCBNP  COVID-19 Labs Recent Labs    01/08/21 1810  DDIMER 1.64*   Lab Results  Component Value Date   SARSCOV2NAA NEGATIVE 01/08/2021   SARSCOV2NAA NEGATIVE 10/09/2020   SARSCOV2NAA NEGATIVE 09/24/2019    Radiological Exams on Admission: CT ABDOMEN PELVIS W CONTRAST  Result Date: 01/08/2021 CLINICAL DATA:  Right lower quadrant pain EXAM: CT ABDOMEN AND PELVIS WITH CONTRAST TECHNIQUE: Multidetector CT imaging of the abdomen and pelvis was performed using the standard protocol following bolus administration of intravenous contrast. CONTRAST:  OMNIPAQUE IOHEXOL 300 MG/ML  SOLN COMPARISON:  CT 10/15/2020 FINDINGS: Lower chest: Lung bases demonstrate patchy densities in the right lung base. There is a small right-sided pleural effusion. Hepatobiliary: No focal liver abnormality is seen. No gallstones, gallbladder wall thickening, or biliary dilatation. Pancreas: Unremarkable. No  pancreatic ductal dilatation or surrounding inflammatory changes. Spleen: Normal in size without focal abnormality. Adrenals/Urinary Tract: Adrenal glands are normal. Cyst in the left kidney. No hydronephrosis. Delayed excretion of contrast consistent with decreased renal function. Bladder normal Stomach/Bowel: Stomach nonenlarged. No dilated small bowel. Negative appendix. No acute bowel wall thickening. Vascular/Lymphatic: Nonaneurysmal aorta. No suspicious nodes. There are multiple enlarged right inguinal lymph nodes. Reproductive: Prostate is unremarkable. Other: Negative for pelvic effusion or free air. Small fat containing left inguinal hernia. Heterogenous density with internal fat slightly to the left of midline within the upper pelvis measuring 5.4 cm, series 2, image 79. Musculoskeletal: No acute osseous abnormality IMPRESSION: 1. Negative for acute appendicitis. 2. Heterogenous mass with internal fat density slightly to the left of midline in the lower abdomen/upper pelvis. Findings could be secondary to fat necrosis and postsurgical change. Other consideration could include omental infarct. 3. Small right pleural effusion with patchy heterogeneous densities in the right base suspicious for respiratory infection/pneumonia 4. Delayed excretion of contrast from kidneys consistent with decreased renal function. Correlate with appropriate laboratory values. 5. Multiple enlarged right inguinal lymph nodes Electronically Signed   By: Jasmine Pang M.D.   On: 01/08/2021 21:43   CT FEMUR RIGHT W CONTRAST  Result Date: 01/08/2021 CLINICAL DATA:  Soft tissue infection cellulitis with wound EXAM: CT OF THE LOWER RIGHT EXTREMITY WITH CONTRAST TECHNIQUE: Multidetector CT imaging of the lower right extremity was performed according to the standard protocol following intravenous contrast administration. CONTRAST:  OMNIPAQUE IOHEXOL 300 MG/ML  SOLN COMPARISON:  None. FINDINGS: Bones/Joint/Cartilage No fracture  or malalignment. No significant right hip effusion.  No periostitis or bony destructive change. Ligaments Suboptimally assessed by CT. Muscles and Tendons No intramuscular fluid collections.  No significant atrophy. Soft tissues Skin thickening of the right gluteal region. Negative for focal fluid collection to suggest soft tissue abscess. Mild nonspecific subcutaneous edema within the right thigh. IMPRESSION: 1. No acute osseous abnormality 2. Prominent skin thickening over the right gluteal region. Negative for soft tissue abscess. Nonspecific mild subcutaneous edema within the right thigh and gluteal region. Electronically Signed   By: Jasmine Pang M.D.   On: 01/08/2021 21:33   DG Chest Port 1 View  Result Date: 01/08/2021 CLINICAL DATA:  Questionable sepsis - evaluate for abnormality EXAM: PORTABLE CHEST 1 VIEW COMPARISON:  Chest radiograph 12/09/2020 FINDINGS: Unchanged cardiomediastinal silhouette. There are diffuse interstitial opacities and right greater than left mid to lower lung opacities. There is a small right pleural effusion. No visible pneumothorax. There is no acute osseous abnormality. IMPRESSION: Diffuse interstitial opacities and bilateral mid to lower lung opacities, right greater than left, favored to represent pulmonary edema. Multifocal infection is possible. Small right pleural effusion. Electronically Signed   By: Caprice Renshaw M.D.   On: 01/08/2021 18:48    EKG: Independently reviewed.  Sinus tachycardia at 123 with a QTC of 498 and QRS of 125 no acute ST-T wave changes.  2D echocardiogram 11/18/2020: Left ventricular ejection fraction, by estimation, is 55 to 60%. The left ventricle has normal function. Left ventricular endocardial border not optimally defined to evaluate regional wall motion. There is mild left ventricular hypertrophy. Left ventricular diastolic function could not be evaluated. 1. Right ventricular systolic function is normal. The right ventricular size is  not well visualized. 2. 3. The mitral valve is grossly normal. No evidence of mitral valve regurgitation. 4. The aortic valve was not well visualized. Aortic valve regurgitation is not visualized.    Assessment/Plan: Patient is a morbidly obese 45 year old Caucasian male presenting with acute onset of shortness of breath and hypoxia since late yesterday.  Patient states that he has been riding the bus and thinks he may have exposed himself that way to have ill contact. Principal Problem:   Shortness of breath Active Problems:   Acute respiratory failure with hypoxia (HCC)   Sepsis (HCC)   Cellulitis and abscess of buttock   Chronic diastolic CHF (congestive heart failure) (HCC)   Essential hypertension   Prediabetes   Shortness of breath/acute respiratory failure with hypoxia: Patient on initial evaluation found to be hypoxic with an O2 sat of 80% which improved on a nonrebreather. Attribute patient's hypoxia secondary to his influenza A infection. We will continue with treatment plan with Tamiflu and supplemental oxygen and additional supportive care with antipyretics and antiemetics and GI prophylaxis. Droplet isolation.  Renally dosed Tamiflu.  Sepsis: Patient meets sepsis criteria and is started on treatment for Tamiflu patient also has an infection on her right gluteus which has been treated by the wound care clinic. We will continue patient on antibiotics patient on initial evaluation received vancomycin, metronidazole, cefepime.  Cellulitis and abscess of the right buttock: Continue patient on cefepime and metronidazole and vancomycin.  Chronic diastolic congestive heart failure: Patient's last echocardiogram was done in November 18, 2020: Left ventricular ejection fraction, by estimation, is 55 to 60%. The left ventricle has normal function. Left ventricular endocardial border not optimally defined to evaluate regional wall motion. There is mild left ventricular  hypertrophy. Left ventricular diastolic function could not be evaluated. 1. Right ventricular systolic function is normal. The  right ventricular size is not well visualized. 2. 3. The mitral valve is grossly normal. No evidence of mitral valve regurgitation. 4. The aortic valve was not well visualized. Aortic valve regurgitation is not visualized.  Htn: Blood pressure 122/61, pulse 100, temperature 99.1 F (37.3 C), temperature source Oral, resp. rate (!) 24, height 6\' 1"  (1.854 m), weight (!) 209.7 kg, related to diet pain and most likely sleep apnea.    Prediabetes: Will start patient on a heart healthy carb consistent diet.  DVT prophylaxis:  Heparin  Code Status:  Full code  Family Communication:  Ritter,Bruce (Friend)  [phone] (Home Phone)   Disposition Plan:  To be determined  Consults called:  None  Admission status: Inpatient   Gertha Calkin MD [facility] [phone] How to contact the [facility] Attending or Consulting provider 7A - 7P or covering provider during after hours 7P -7A, for this patient.    Check the care team in [facility] and look for a) attending/consulting [facility] provider listed and b) the [facility] team listed Log into [email or web address] and use [facility] universal password to access. If you do not have the password, please contact the hospital operator. Locate the [facility] provider you are looking for under [facility] and page to a number that you can be directly reached. If you still have difficulty reaching the provider, please page the [facility] (Director on Call) for the Hospitalists listed on amion for assistance. [email or web address] Password [facility] 01/08/2021, 11:31 PM

## 2021-01-08 NOTE — ED Notes (Signed)
Pt tolerated pills one at a time followed by sips of water when loosening up one side of bipap

## 2021-01-08 NOTE — Progress Notes (Signed)
CODE SEPSIS - PHARMACY COMMUNICATION  **Broad Spectrum Antibiotics should be administered within 1 hour of Sepsis diagnosis**  Time Code Sepsis Called/Page Received: 01/08/21  1816  Antibiotics Ordered: cefepime, vanc, metro  Time of 1st antibiotic administration: 1817  Additional action taken by pharmacy:    If necessary, Name of Provider/Nurse Contacted:      Angelique Blonder ,PharmD Clinical Pharmacist  01/08/2021  6:21 PM

## 2021-01-09 ENCOUNTER — Inpatient Hospital Stay: Payer: Self-pay

## 2021-01-09 LAB — BLOOD CULTURE ID PANEL (REFLEXED) - BCID2

## 2021-01-09 LAB — CBC
HCT: 35.9 % — ABNORMAL LOW (ref 39.0–52.0)
Hemoglobin: 11.2 g/dL — ABNORMAL LOW (ref 13.0–17.0)
MCH: 25.6 pg — ABNORMAL LOW (ref 26.0–34.0)
MCHC: 31.2 g/dL (ref 30.0–36.0)
MCV: 82 fL (ref 80.0–100.0)
Platelets: 257 10*3/uL (ref 150–400)
RBC: 4.38 MIL/uL (ref 4.22–5.81)
RDW: 15.7 % — ABNORMAL HIGH (ref 11.5–15.5)
WBC: 5.4 10*3/uL (ref 4.0–10.5)
nRBC: 0 % (ref 0.0–0.2)

## 2021-01-09 LAB — IRON AND TIBC
Iron: 8 ug/dL — ABNORMAL LOW (ref 45–182)
Saturation Ratios: 4 % — ABNORMAL LOW (ref 17.9–39.5)
TIBC: 230 ug/dL — ABNORMAL LOW (ref 250–450)
UIBC: 222 ug/dL

## 2021-01-09 LAB — FOLATE: Folate: 19.5 ng/mL (ref >5.9)

## 2021-01-09 LAB — RETICULOCYTES
Immature Retic Fract: 13.4 % (ref 2.3–15.9)
RBC.: 4.44 MIL/uL (ref 4.22–5.81)
Retic Count, Absolute: 61.7 10*3/uL (ref 19.0–186.0)
Retic Ct Pct: 1.4 % (ref 0.4–3.1)

## 2021-01-09 LAB — PROCALCITONIN: Procalcitonin: 0.2 ng/mL

## 2021-01-09 LAB — VITAMIN B12: Vitamin B-12: 610 pg/mL (ref 180–914)

## 2021-01-09 LAB — CREATININE, SERUM
Creatinine, Ser: 1.07 mg/dL (ref 0.61–1.24)
GFR, Estimated: >60 mL/min (ref >60)

## 2021-01-09 LAB — FERRITIN: Ferritin: 121 ng/mL (ref 24–336)

## 2021-01-09 LAB — CORTISOL-AM, BLOOD: Cortisol - AM: 15.9 ug/dL (ref 6.7–22.6)

## 2021-01-09 IMAGING — CT CT ANGIO CHEST
2 of 6 series · 17 of 46 positions shown · IV contrast (APPLIED)
Comparison: Chest x-ray from yesterday.

CLINICAL DATA: Hypoxia and shortness of breath.  Elevated D-dimer.

EXAM:
CT ANGIOGRAPHY CHEST WITH CONTRAST
TECHNIQUE: Multidetector CT imaging of the chest was performed using the
standard protocol during bolus administration of intravenous
contrast. Multiplanar CT image reconstructions and MIPs were
obtained to evaluate the vascular anatomy.
CONTRAST:  100mL OMNIPAQUE IOHEXOL 350 MG/ML SOLN

[Series 6: thins · axial · 0.83mm/px · z∈[-850,-584]mm · 14 of 364 slices shown]
[im 16/364  lung]
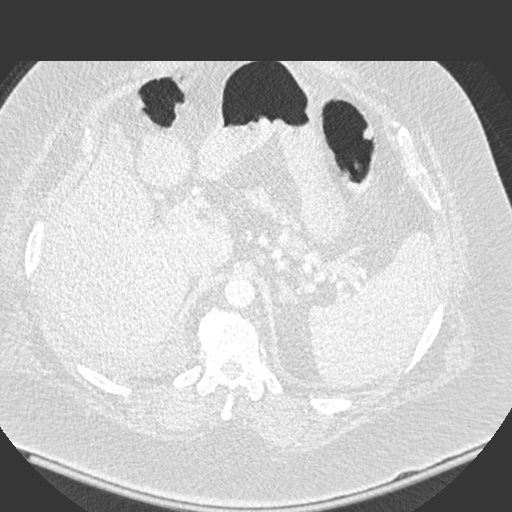
[im 46/364  soft-tissue]
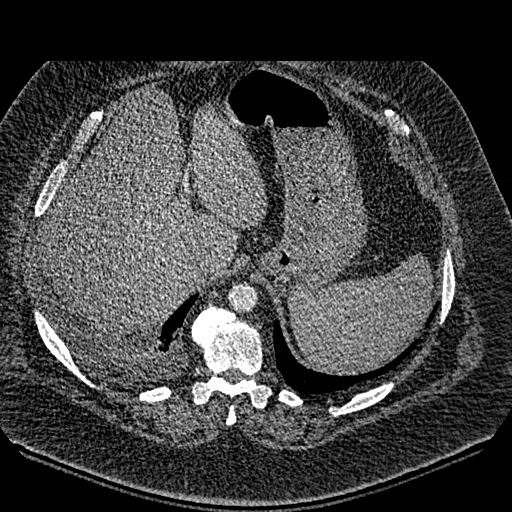
[im 76/364  lung]
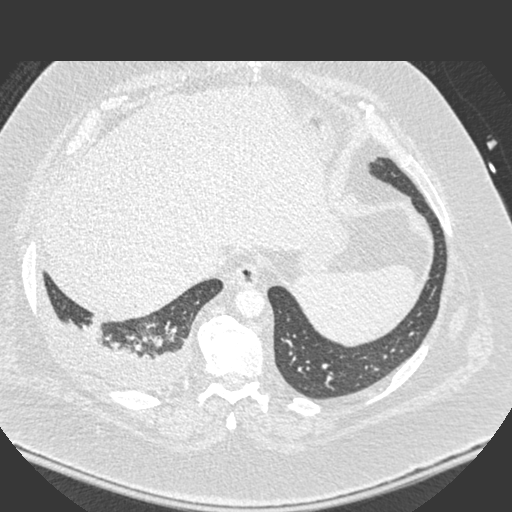
[im 91/364  soft-tissue]
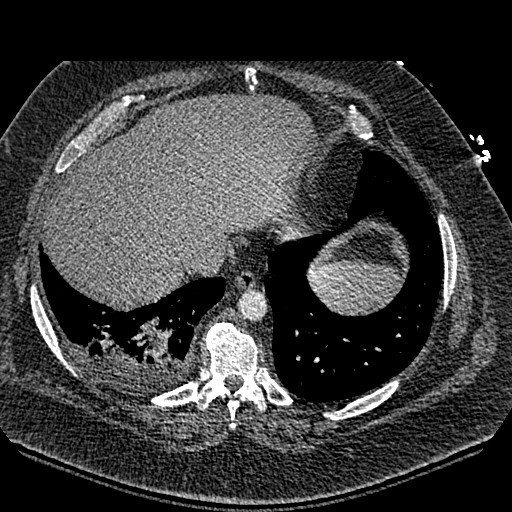
[im 122/364  lung]
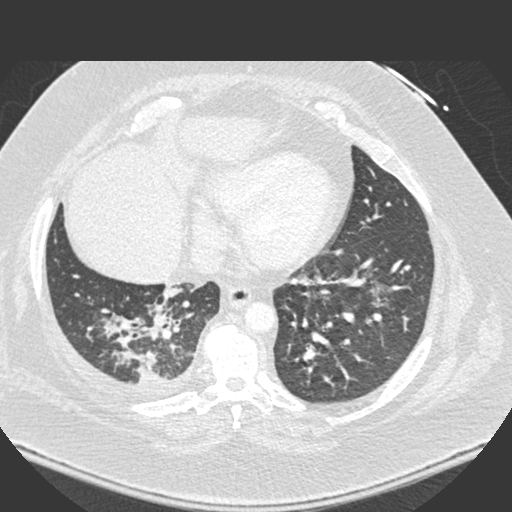
[im 152/364  soft-tissue]
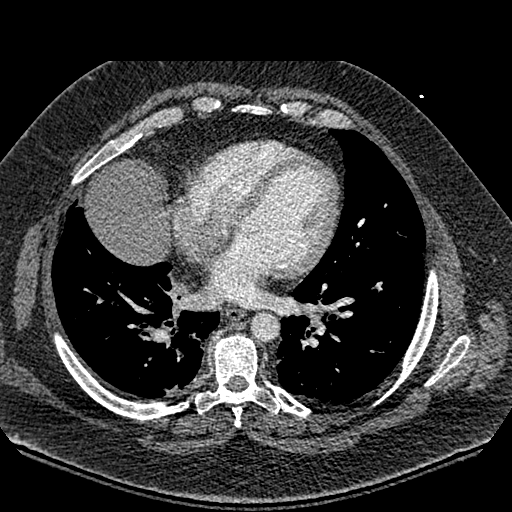
[im 167/364  lung]
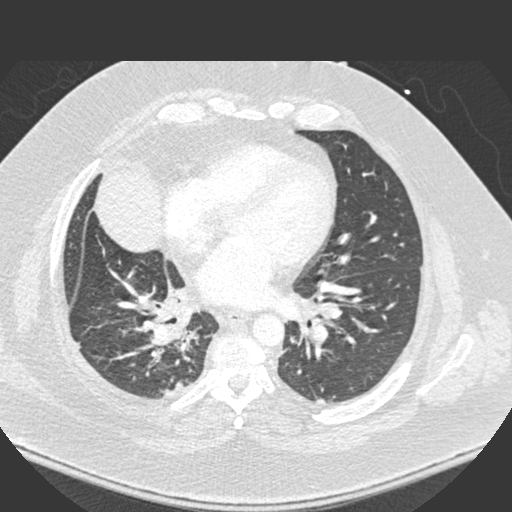
[im 197/364  soft-tissue]
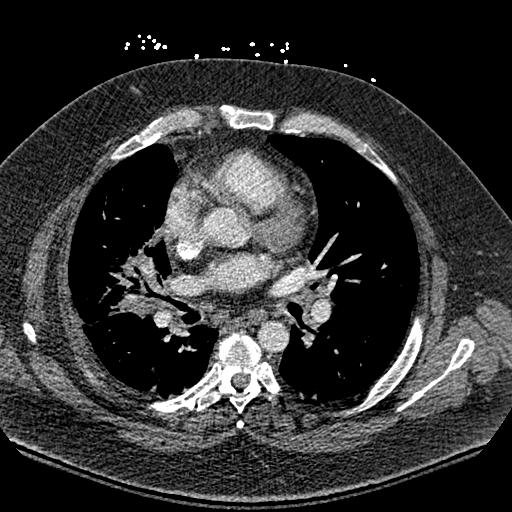
[im 212/364  lung]
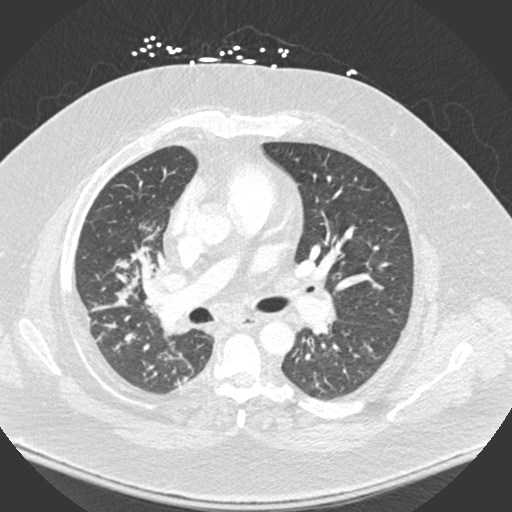
[im 243/364  soft-tissue]
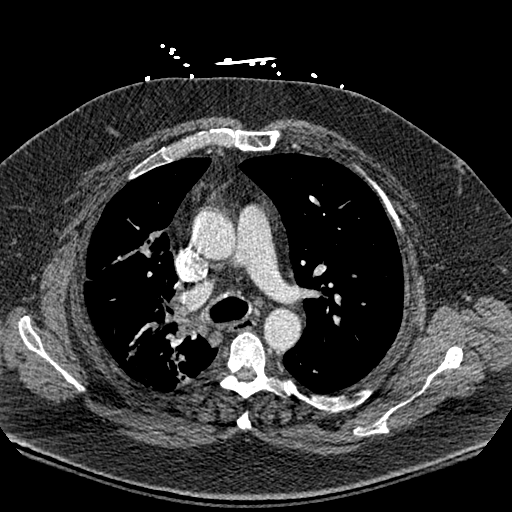
[im 273/364  lung]
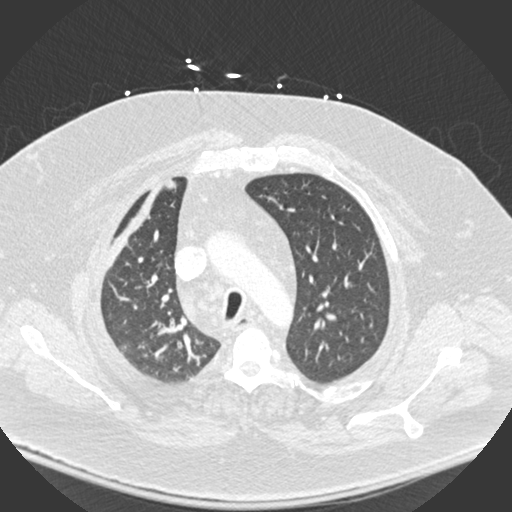
[im 288/364  soft-tissue]
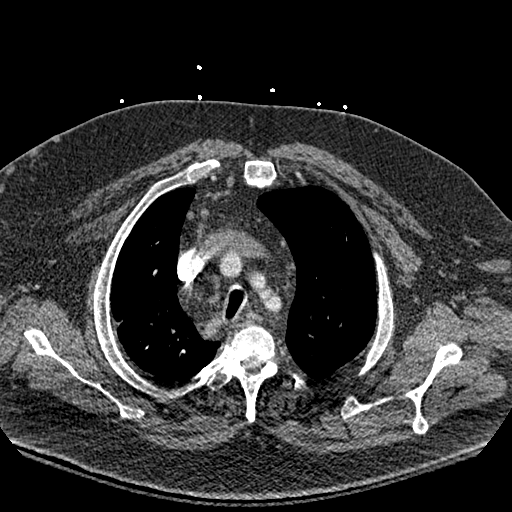
[im 318/364  lung]
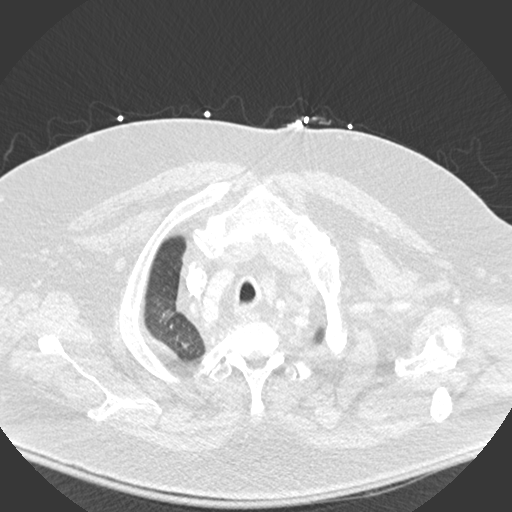
[im 348/364  soft-tissue]
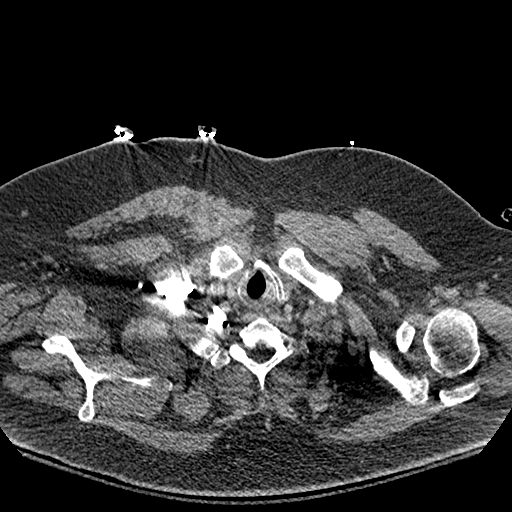

[Series 8: coronal mpr · coronal · 0.59mm/px · 3 of 111 slices shown]
[im 28/111  soft-tissue]
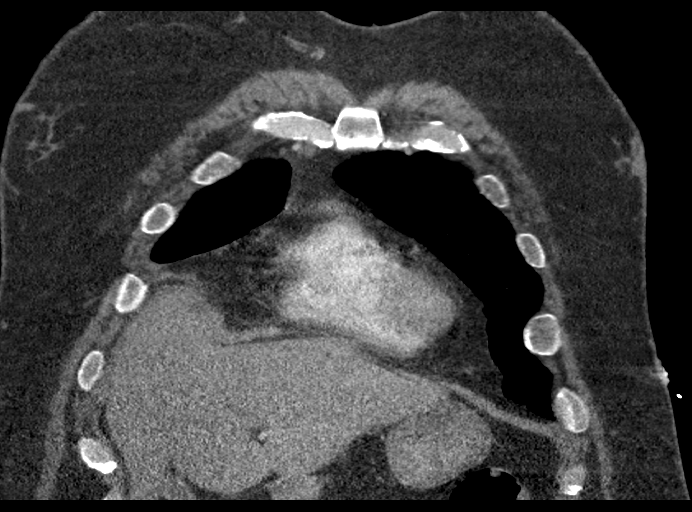
[im 56/111  soft-tissue]
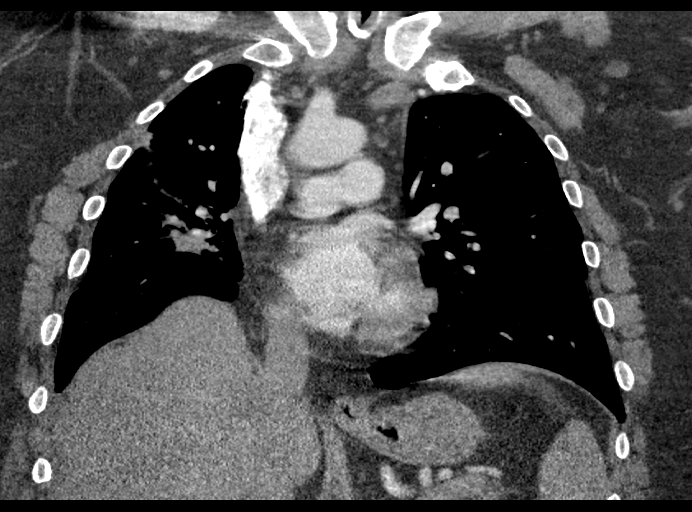
[im 83/111  soft-tissue]
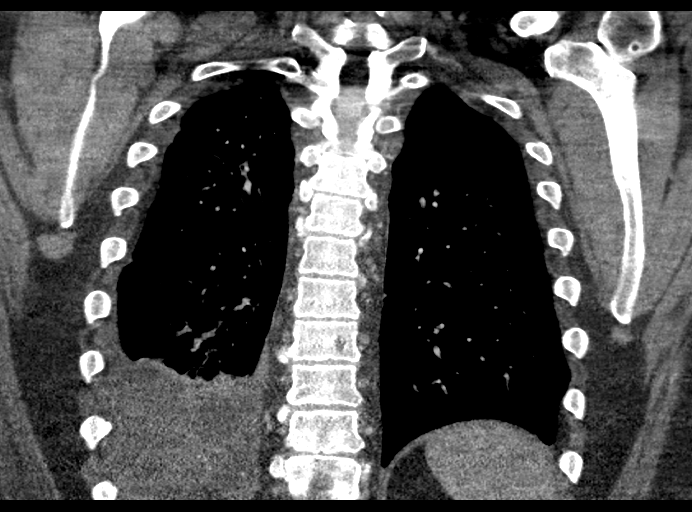

[17 of 46 positions shown; findings below may reference images not displayed]

FINDINGS: Cardiovascular: Suboptimal opacification of the segmental pulmonary
arteries. No central or lobar pulmonary embolism. Normal heart size.
No pericardial effusion. No thoracic aortic aneurysm or dissection.

Mediastinum/Nodes: Few mildly enlarged mediastinal and right hilar
lymph nodes measuring up to 1.1 cm in short axis, likely reactive.
No enlarged axillary lymph nodes. Thyroid gland, trachea, and
esophagus demonstrate no significant findings.

Lungs/Pleura: Prominent right middle and lower lobe peribronchial
thickening and peribronchovascular consolidation with scattered
patchy irregular opacities. This involves the posterior right upper
lobe to lesser extent as well. Trace right pleural effusion. Linear
scarring in the right upper lobe. 5 mm pulmonary nodule in the right
lower lobe (series 7, image 63). 7 mm pulmonary nodule in the left
lower lobe (series 7, image 76). No pneumothorax.

Upper Abdomen: No acute abnormality.

Musculoskeletal: No chest wall abnormality. No acute or significant
osseous findings.

Review of the MIP images confirms the above findings.
IMPRESSION: 1. Suboptimal opacification of the segmental pulmonary arteries. No
central or lobar pulmonary embolism.
2. Multifocal pneumonia in the right lung.
3. Trace right pleural effusion.
4. Bilateral pulmonary nodules measuring up to 7 mm. Recommend a
non-contrast Chest CT at 3-6 months. If patient is high risk for
malignancy, recommend an additional non-contrast Chest CT at 18-24
months; if patient is low risk for malignancy a non-contrast Chest
CT at 18-24 months is optional. These guidelines do not apply to
immunocompromised patients and patients with cancer. Follow up in
patients with significant comorbidities as clinically warranted. For
lung cancer screening, adhere to Lung-RADS guidelines. Reference:

## 2021-01-09 MED ORDER — FUROSEMIDE 40 MG PO TABS
40.0000 mg | ORAL_TABLET | Freq: Two times a day (BID) | ORAL | Status: DC
  Administered 2021-01-09 – 2021-01-14 (×11): 40 mg via ORAL
  Filled 2021-01-09 (×12): qty 1

## 2021-01-09 MED ORDER — IOHEXOL 350 MG/ML SOLN
100.0000 mL | Freq: Once | INTRAVENOUS | Status: AC | PRN
  Administered 2021-01-09: 100 mL via INTRAVENOUS

## 2021-01-09 MED ORDER — SERTRALINE HCL 50 MG PO TABS
50.0000 mg | ORAL_TABLET | Freq: Every day | ORAL | Status: DC
  Administered 2021-01-09 – 2021-02-12 (×35): 50 mg via ORAL
  Filled 2021-01-09 (×35): qty 1

## 2021-01-09 MED ORDER — SODIUM CHLORIDE 0.9 % IV SOLN: 2.0000 g | Freq: Three times a day (TID) | INTRAVENOUS | Status: DC

## 2021-01-09 MED ORDER — ENOXAPARIN SODIUM 120 MG/0.8ML IJ SOSY
0.5000 mg/kg | PREFILLED_SYRINGE | INTRAMUSCULAR | Status: DC
  Administered 2021-01-09 – 2021-02-06 (×29): 105 mg via SUBCUTANEOUS
  Filled 2021-01-09 (×31): qty 0.7

## 2021-01-09 MED ORDER — CEFAZOLIN SODIUM-DEXTROSE 2-4 GM/100ML-% IV SOLN
2.0000 g | Freq: Three times a day (TID) | INTRAVENOUS | Status: DC
  Administered 2021-01-09 – 2021-01-10 (×4): 2 g via INTRAVENOUS
  Filled 2021-01-09 (×6): qty 100

## 2021-01-09 MED ORDER — VANCOMYCIN HCL 2000 MG/400ML IV SOLN
2000.0000 mg | Freq: Two times a day (BID) | INTRAVENOUS | Status: DC
  Filled 2021-01-09: qty 400

## 2021-01-09 MED ORDER — VANCOMYCIN HCL 1500 MG/300ML IV SOLN
1500.0000 mg | Freq: Once | INTRAVENOUS | Status: AC
  Administered 2021-01-09: 1500 mg via INTRAVENOUS
  Filled 2021-01-09: qty 300

## 2021-01-09 MED ORDER — ALBUTEROL SULFATE (2.5 MG/3ML) 0.083% IN NEBU
2.5000 mg | INHALATION_SOLUTION | RESPIRATORY_TRACT | Status: DC | PRN
  Administered 2021-02-03: 2.5 mg via RESPIRATORY_TRACT
  Filled 2021-01-09: qty 3

## 2021-01-09 NOTE — Consult Note (Signed)
PHARMACIST - PHYSICIAN COMMUNICATION  CONCERNING:  Enoxaparin (Lovenox) for DVT Prophylaxis    RECOMMENDATION: Patient was prescribed enoxaprin 40mg  q24 hours for VTE prophylaxis.   Filed Weights   01/08/21 1759  Weight: (!) 209.7 kg (462 lb 3.2 oz)    Body mass index is 60.98 kg/m.  Estimated Creatinine Clearance: 160.8 mL/min (by C-G formula based on SCr of 1.07 mg/dL).   Based on [facility] policy patient is candidate for enoxaparin 0.5mg /kg TBW SQ every 24 hours based on BMI being >30.  DESCRIPTION: Pharmacy has adjusted enoxaparin dose per [facility] policy.  Patient is now receiving enoxaparin 105 mg every 24 hours    [facility], PharmD Pharmacy Resident  01/09/2021 8:26 AM

## 2021-01-09 NOTE — Progress Notes (Signed)
PT Cancellation Note  Patient Details Name: Christian [name] MRN: [id number] DOB: [birth year]   Cancelled Treatment:    Reason Eval/Treat Not Completed: Other (comment);Medical issues which prohibited therapy;Fatigue/lethargy limiting ability to participate.  Pt was in bed and tired, very SOB and compromised to try to walk.  Was walking independently previously so will anticipate in AM that he will be good.  Follow up as time and pt allow.   Ivar Drape 01/09/2021, 3:13 PM  Samul Dada, PT PhD Acute Rehab Dept. Number: [facility] [phone] and [facility] [phone]

## 2021-01-09 NOTE — Consult Note (Signed)
WOC Nurse Consult Note: Reason for Consult:Patient known to our service from two previous admissions. Seen by this writer on 11/06/20. 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 Please see his note from that encounter. POC is further complicated by the patient currently residing in a homeless shelter and the access to required supplies. Wound type:Pressure Pressure Injury POA: Yes Measurement: 2.1cm x 12cm x 0.2cm (4 days ago at the outpatient [facility]) Wound bed: Red, moist with no nonviable tissue Drainage (amount, consistency, odor) small to moderate serous Periwound: with evidence of previous wound healing, contraction at periphery.  Dressing procedure/placement/frequency: A mattress replacement with low air loss feature is provided. Turning and repositioning per protocol with HOB at or below a 30 degree angle is recommended.  Timely incontinence care using our house incontinence products is indicated. Daily bathing is recommended. Due to the past history of bilateral heel pressure injuries, bilateral Pressure redistribution heel boots are provided to both float the heels and correct the lateral rotation of the feet. Topical. Wound care to the Stage 3 pressure injury will be with a daily cleansing with NS, followed by patting dry and dressing with a calcium alginate topped with an ABD pad and secured with tape.   Patient to follow up with the outpatient wound care center as he was directed post discharge.   WOC nursing team will not follow, but will remain available to this patient, the nursing and medical teams.  Please re-consult if needed. Thanks, Ladona Mow, MSN, RN, GNP, [facility]  Pager# [phone]

## 2021-01-09 NOTE — ED Notes (Signed)
Pt set up for breakfast.

## 2021-01-09 NOTE — Progress Notes (Signed)
Pharmacy Antibiotic Note  Eris Breck is a 45 y.o. male admitted on 01/08/2021 with sepsis.  Pharmacy has been consulted for Vancomycin dosing.  Plan: Vancomycin 1 gm IV X 1 given in ED on 11/11 @ 1832. Additional Vanc 1500 mg IV X 1 ordered to make total loading dose of 2500 mg.  Vancomycin 2 gm IV Q12H ordered to start on 11/12 @ 0700.   AUC = 463 Vanc trough = 13.3   Height: 6\' 1"  (185.4 cm) Weight: (!) 209.7 kg (462 lb 3.2 oz) IBW/kg (Calculated) : 79.9  Temp (24hrs), Avg:100 F (37.8 C), Min:99.1 F (37.3 C), Max:100.8 F (38.2 C)  Recent Labs  Lab 01/08/21 1810 01/08/21 2257  WBC 10.0  --   CREATININE 1.11  --   LATICACIDVEN 1.0 1.7    Estimated Creatinine Clearance: 155 mL/min (by C-G formula based on SCr of 1.11 mg/dL).    Allergies  Allergen Reactions   Shellfish Allergy Nausea Only    Antimicrobials this admission:   >>    >>   Dose adjustments this admission:   Microbiology results:  BCx:   UCx:    Sputum:    MRSA PCR:   Thank you for allowing pharmacy to be a part of this patient's care.  Adelise Buswell D 01/09/2021 12:46 AM

## 2021-01-09 NOTE — ED Notes (Signed)
Informed RN bed assigned 

## 2021-01-09 NOTE — Progress Notes (Signed)
PHARMACY - PHYSICIAN COMMUNICATION CRITICAL VALUE ALERT - BLOOD CULTURE IDENTIFICATION (BCID)  Christian [name] is an 45 y.o. male who presented to [facility] on 01/08/2021 with a chief complaint of shortness of breath. Positive for Influenza A,pressure ulcer, and acute hypoxia.  Assessment:   1 out of 4 bottles aerobic - positive for Staph species. BCID unable to further identify species. Not S aureaus, S epidermidis or S lugdunensis  Name of physician (or Provider) Contacted: Dr. Ashok Pall  Current antibiotics: Ancef 2gm q 8hrs  Changes to prescribed antibiotics recommended:  No change in antibiotic at the moment. Will repeat blood culture and follow up accodently.  Results for orders placed or performed during the hospital encounter of 01/08/21  Blood Culture ID Panel (Reflexed) (Collected: 01/08/2021  6:10 PM)  Result Value Ref Range   Enterococcus faecalis NOT DETECTED NOT DETECTED   Enterococcus Faecium NOT DETECTED NOT DETECTED   Listeria monocytogenes NOT DETECTED NOT DETECTED   Staphylococcus species DETECTED (A) NOT DETECTED   Staphylococcus aureus (BCID) NOT DETECTED NOT DETECTED   Staphylococcus epidermidis NOT DETECTED NOT DETECTED   Staphylococcus lugdunensis NOT DETECTED NOT DETECTED   Streptococcus species NOT DETECTED NOT DETECTED   Streptococcus agalactiae NOT DETECTED NOT DETECTED   Streptococcus pneumoniae NOT DETECTED NOT DETECTED   Streptococcus pyogenes NOT DETECTED NOT DETECTED   A.calcoaceticus-baumannii NOT DETECTED NOT DETECTED   Bacteroides fragilis NOT DETECTED NOT DETECTED   Enterobacterales NOT DETECTED NOT DETECTED   Enterobacter cloacae complex NOT DETECTED NOT DETECTED   Escherichia coli NOT DETECTED NOT DETECTED   Klebsiella aerogenes NOT DETECTED NOT DETECTED   Klebsiella oxytoca NOT DETECTED NOT DETECTED   Klebsiella pneumoniae NOT DETECTED NOT DETECTED   Proteus species NOT DETECTED NOT DETECTED   Salmonella species NOT DETECTED NOT DETECTED    Serratia marcescens NOT DETECTED NOT DETECTED   Haemophilus influenzae NOT DETECTED NOT DETECTED   Neisseria meningitidis NOT DETECTED NOT DETECTED   Pseudomonas aeruginosa NOT DETECTED NOT DETECTED   Stenotrophomonas maltophilia NOT DETECTED NOT DETECTED   Candida albicans NOT DETECTED NOT DETECTED   Candida auris NOT DETECTED NOT DETECTED   Candida glabrata NOT DETECTED NOT DETECTED   Candida krusei NOT DETECTED NOT DETECTED   Candida parapsilosis NOT DETECTED NOT DETECTED   Candida tropicalis NOT DETECTED NOT DETECTED   Cryptococcus neoformans/gattii NOT DETECTED NOT DETECTED   Ed Rayson Rodriguez-Guzman PharmD, BCPS 01/09/2021 3:54 PM

## 2021-01-09 NOTE — ED Notes (Signed)
Pt with dressing to right anterior thigh, dressing clean, dry and intact

## 2021-01-09 NOTE — ED Notes (Signed)
RT called per MD to assess pt's need for bipap. RT also informed that nebulizer part of bipap not connected. This RN will give breathing treatment once nebulizer connected.

## 2021-01-09 NOTE — Progress Notes (Addendum)
PROGRESS NOTE    Christian [name]  [id number] DOB: [birth year] DOA: 01/08/2021 PCP: Pcp, No  Outpatient Specialists: none    Brief Narrative:   From admission h and p Christian [name] is a 45 y.o. male seen in ed with complaints of sob, that started yesterday  Shortness of breath has progressed and is worse today along with cough when seen by EMS patient was found to be 80% on O2 sats at room air and was started on a nonrebreather.  Patient also had a fever and had a cellulitic area on his right buttock that is being also treated by the wound clinic for one month .  Patient initially with respiratory distress and respiratory failure and hypoxia and required BiPAP, code sepsis was activated.  Patient was given vancomycin, cefepime and metronidazole along with Tamiflu.     Assessment & Plan:   Principal Problem:   Shortness of breath Active Problems:   Sepsis ([place])   Essential hypertension   Acute respiratory failure with hypoxia (HCC)   Chronic diastolic CHF (congestive heart failure) (HCC)   Cellulitis and abscess of buttock   Prediabetes   # Influenza A # Sepsis Here symptomatic with fever, hypoxia, tachypnea. - continue tamiflu (11/11>)  # Acute hypoxic respiratory failure 2/2 flu 1, obesity hypoventilation syndrome - continue bipap, wean as able  # Obesity hypoventilation syndrome - will plan on continuing qhs bipap  # Elevated d dimer In setting of recent hospitalization, abnormal cxr, hypoxia - will check lower extremity PVLs. If negative will check CTA of chest  # Pressure ulcer Stage 2 to right upper thigh. Surrounding erythema though no notable induration, no abscess seen on CT - wound care consult - de-escalate abx to ancef 2 g q8  # HFpEF Difficult to ascertain volume status given body habitus. Does have trace pleural effusions and LE edema with mildly elevated bnp - resume home lasix 40 po bid  # Homelessness - TOC consult  # Debility - PT  consult    DVT prophylaxis: lovenox Code Status: full Family Communication: none (patient says he is estranged from family)  Level of care: Progressive Status is: Inpatient  Remains inpatient appropriate because: severity of illness        Consultants:  none  Procedures: none  Antimicrobials:  Vanc/cefepime/flagyl 11/11 Ancef 11/12>    Subjective: This morning says breathing feels better. No abd pain or n/v. No chest pain.   Objective: Vitals:   01/09/21 0000 01/09/21 0032 01/09/21 0200 01/09/21 0500  BP: (!) 125/54   (!) 105/46  Pulse: 97 100  92  Resp: (!) 27  (!) 22 20  Temp:      TempSrc:      SpO2: 96% 98%  93%  Weight:      Height:        Intake/Output Summary (Last 24 hours) at 01/09/2021 0802 Last data filed at 01/09/2021 0430 Gross per 24 hour  Intake 746.73 ml  Output 1350 ml  Net -603.27 ml   Filed Weights   01/08/21 1759  Weight: (!) 209.7 kg    Examination:  General exam: Appears calm and comfortable. Morbidly obese Respiratory system: rales at bases, no wheeze Cardiovascular system: S1 & S2 heard, RRR. No JVD, murmurs, rubs, gallops or clicks. 1+ pedal edema Gastrointestinal system: Abdomen is obese, soft and nontender. No organomegaly or masses felt. Normal bowel sounds heard. Healed midline incision Central nervous system: Alert and oriented. No focal neurological deficits. Extremities: Symmetric 5  x 5 power. Skin: several centimeter shallow ulcer right upper posterior thigh w/ surrounding erythema Psychiatry: Judgement and insight appear normal. Mood & affect appropriate.     Data Reviewed: I have personally reviewed following labs and imaging studies  CBC: Recent Labs  Lab 01/08/21 1810 01/09/21 0623  WBC 10.0 5.4  NEUTROABS 8.9*  --   HGB 12.8* 11.2*  HCT 40.9 35.9*  MCV 80.8 82.0  PLT 333 257   Basic Metabolic Panel: Recent Labs  Lab 01/08/21 1810 01/09/21 0623  NA 134*  --   K 3.8  --   CL 100  --   CO2  27  --   GLUCOSE 140*  --   BUN 13  --   CREATININE 1.11 1.07  CALCIUM 8.4*  --    GFR: Estimated Creatinine Clearance: 160.8 mL/min (by C-G formula based on SCr of 1.07 mg/dL). Liver Function Tests: Recent Labs  Lab 01/08/21 1810  AST 18  ALT 17  ALKPHOS 69  BILITOT 0.8  PROT 7.5  ALBUMIN 3.5   No results for input(s): LIPASE, AMYLASE in the last 168 hours. No results for input(s): AMMONIA in the last 168 hours. Coagulation Profile: Recent Labs  Lab 01/08/21 1810  INR 1.1   Cardiac Enzymes: No results for input(s): CKTOTAL, CKMB, CKMBINDEX, TROPONINI in the last 168 hours. BNP (last 3 results) No results for input(s): PROBNP in the last 8760 hours. HbA1C: No results for input(s): HGBA1C in the last 72 hours. CBG: No results for input(s): GLUCAP in the last 168 hours. Lipid Profile: No results for input(s): CHOL, HDL, LDLCALC, TRIG, CHOLHDL, LDLDIRECT in the last 72 hours. Thyroid Function Tests: No results for input(s): TSH, T4TOTAL, FREET4, T3FREE, THYROIDAB in the last 72 hours. Anemia Panel: Recent Labs    01/09/21 0623  FOLATE 19.5  FERRITIN 121  TIBC 230*  IRON 8*  RETICCTPCT 1.4   Urine analysis:    Component Value Date/Time   COLORURINE YELLOW (A) 01/08/2021 1900   APPEARANCEUR CLEAR (A) 01/08/2021 1900   LABSPEC 1.017 01/08/2021 1900   PHURINE 5.0 01/08/2021 1900   GLUCOSEU NEGATIVE 01/08/2021 1900   HGBUR SMALL (A) 01/08/2021 1900   BILIRUBINUR NEGATIVE 01/08/2021 1900   KETONESUR NEGATIVE 01/08/2021 1900   PROTEINUR NEGATIVE 01/08/2021 1900   NITRITE NEGATIVE 01/08/2021 1900   LEUKOCYTESUR NEGATIVE 01/08/2021 1900   Sepsis Labs: @LABRCNTIP (procalcitonin:4,lacticidven:4)  ) Recent Results (from the past 240 hour(s))  Resp Panel by RT-PCR (Flu A&B, Covid) Nasopharyngeal Swab     Status: Abnormal   Collection Time: 01/08/21  6:10 PM   Specimen: Nasopharyngeal Swab; Nasopharyngeal(NP) swabs in vial transport medium  Result Value Ref Range  Status   SARS Coronavirus 2 by RT PCR NEGATIVE NEGATIVE Final    Comment: (NOTE) SARS-CoV-2 target nucleic acids are NOT DETECTED.  The SARS-CoV-2 RNA is generally detectable in upper respiratory specimens during the acute phase of infection. The lowest concentration of SARS-CoV-2 viral copies this assay can detect is 138 copies/mL. A negative result does not preclude SARS-Cov-2 infection and should not be used as the sole basis for treatment or other patient management decisions. A negative result may occur with  improper specimen collection/handling, submission of specimen other than nasopharyngeal swab, presence of viral mutation(s) within the areas targeted by this assay, and inadequate number of viral copies(<138 copies/mL). A negative result must be combined with clinical observations, patient history, and epidemiological information. The expected result is Negative.  Fact Sheet for Patients:  13/11/22  Fact Sheet for Healthcare Providers:  IncredibleEmployment.be  This test is no t yet approved or cleared by the [place] FDA and  has been authorized for detection and/or diagnosis of SARS-CoV-2 by FDA under an Emergency Use Authorization (EUA). This EUA will remain  in effect (meaning this test can be used) for the duration of the COVID-19 declaration under Section 564(b)(1) of the Act, 21 U.S.C.section 360bbb-3(b)(1), unless the authorization is terminated  or revoked sooner.       Influenza A by PCR POSITIVE (A) NEGATIVE Final   Influenza B by PCR NEGATIVE NEGATIVE Final    Comment: (NOTE) The Xpert Xpress SARS-CoV-2/FLU/RSV plus assay is intended as an aid in the diagnosis of influenza from Nasopharyngeal swab specimens and should not be used as a sole basis for treatment. Nasal washings and aspirates are unacceptable for Xpert Xpress SARS-CoV-2/FLU/RSV testing.  Fact Sheet for  Patients: EntrepreneurPulse.com.au  Fact Sheet for Healthcare Providers: IncredibleEmployment.be  This test is not yet approved or cleared by the [place] FDA and has been authorized for detection and/or diagnosis of SARS-CoV-2 by FDA under an Emergency Use Authorization (EUA). This EUA will remain in effect (meaning this test can be used) for the duration of the COVID-19 declaration under Section 564(b)(1) of the Act, 21 U.S.C. section 360bbb-3(b)(1), unless the authorization is terminated or revoked.  Performed at [facility], [address] [place], [place] [address]   Blood Culture (routine x 2)     Status: None (Preliminary result)   Collection Time: 01/08/21  6:10 PM   Specimen: BLOOD  Result Value Ref Range Status   Specimen Description BLOOD L CHEST  Final   Special Requests   Final    BOTTLES DRAWN AEROBIC AND ANAEROBIC Blood Culture results may not be optimal due to an inadequate volume of blood received in culture bottles   Culture   Final    NO GROWTH < 24 HOURS Performed at [facility], [address], [place], [place] [address]    Report Status PENDING  Incomplete  Blood Culture (routine x 2)     Status: None (Preliminary result)   Collection Time: 01/08/21  6:15 PM   Specimen: BLOOD  Result Value Ref Range Status   Specimen Description BLOOD RIGHT ANTECUBITAL  Final   Special Requests   Final    BOTTLES DRAWN AEROBIC AND ANAEROBIC Blood Culture adequate volume   Culture   Final    NO GROWTH < 24 HOURS Performed at [facility], [address] [place], [place] [address]    Report Status PENDING  Incomplete         Radiology Studies: CT ABDOMEN PELVIS W CONTRAST  Result Date: 01/08/2021 CLINICAL DATA:  Right lower quadrant pain EXAM: CT ABDOMEN AND PELVIS WITH CONTRAST TECHNIQUE: Multidetector CT imaging of the abdomen and pelvis was performed using the standard protocol following  bolus administration of intravenous contrast. CONTRAST:  163mL OMNIPAQUE IOHEXOL 300 MG/ML  SOLN COMPARISON:  CT 10/15/2020 FINDINGS: Lower chest: Lung bases demonstrate patchy densities in the right lung base. There is a small right-sided pleural effusion. Hepatobiliary: No focal liver abnormality is seen. No gallstones, gallbladder wall thickening, or biliary dilatation. Pancreas: Unremarkable. No pancreatic ductal dilatation or surrounding inflammatory changes. Spleen: Normal in size without focal abnormality. Adrenals/Urinary Tract: Adrenal glands are normal. Cyst in the left kidney. No hydronephrosis. Delayed excretion of contrast consistent with decreased renal function. Bladder normal Stomach/Bowel: Stomach nonenlarged. No dilated small bowel. Negative appendix.  No acute bowel wall thickening. Vascular/Lymphatic: Nonaneurysmal aorta. No suspicious nodes. There are multiple enlarged right inguinal lymph nodes. Reproductive: Prostate is unremarkable. Other: Negative for pelvic effusion or free air. Small fat containing left inguinal hernia. Heterogenous density with internal fat slightly to the left of midline within the upper pelvis measuring 5.4 cm, series 2, image 79. Musculoskeletal: No acute osseous abnormality IMPRESSION: 1. Negative for acute appendicitis. 2. Heterogenous mass with internal fat density slightly to the left of midline in the lower abdomen/upper pelvis. Findings could be secondary to fat necrosis and postsurgical change. Other consideration could include omental infarct. 3. Small right pleural effusion with patchy heterogeneous densities in the right base suspicious for respiratory infection/pneumonia 4. Delayed excretion of contrast from kidneys consistent with decreased renal function. Correlate with appropriate laboratory values. 5. Multiple enlarged right inguinal lymph nodes Electronically Signed   By: Donavan Foil M.D.   On: 01/08/2021 21:43   CT FEMUR RIGHT W CONTRAST  Result  Date: 01/08/2021 CLINICAL DATA:  Soft tissue infection cellulitis with wound EXAM: CT OF THE LOWER RIGHT EXTREMITY WITH CONTRAST TECHNIQUE: Multidetector CT imaging of the lower right extremity was performed according to the standard protocol following intravenous contrast administration. CONTRAST:  129mL OMNIPAQUE IOHEXOL 300 MG/ML  SOLN COMPARISON:  None. FINDINGS: Bones/Joint/Cartilage No fracture or malalignment. No significant right hip effusion. No periostitis or bony destructive change. Ligaments Suboptimally assessed by CT. Muscles and Tendons No intramuscular fluid collections.  No significant atrophy. Soft tissues Skin thickening of the right gluteal region. Negative for focal fluid collection to suggest soft tissue abscess. Mild nonspecific subcutaneous edema within the right thigh. IMPRESSION: 1. No acute osseous abnormality 2. Prominent skin thickening over the right gluteal region. Negative for soft tissue abscess. Nonspecific mild subcutaneous edema within the right thigh and gluteal region. Electronically Signed   By: Donavan Foil M.D.   On: 01/08/2021 21:33   DG Chest Port 1 View  Result Date: 01/08/2021 CLINICAL DATA:  Questionable sepsis - evaluate for abnormality EXAM: PORTABLE CHEST 1 VIEW COMPARISON:  Chest radiograph 12/09/2020 FINDINGS: Unchanged cardiomediastinal silhouette. There are diffuse interstitial opacities and right greater than left mid to lower lung opacities. There is a small right pleural effusion. No visible pneumothorax. There is no acute osseous abnormality. IMPRESSION: Diffuse interstitial opacities and bilateral mid to lower lung opacities, right greater than left, favored to represent pulmonary edema. Multifocal infection is possible. Small right pleural effusion. Electronically Signed   By: Maurine Simmering M.D.   On: 01/08/2021 18:48        Scheduled Meds:  albuterol  2.5 mg Nebulization Q6H   aspirin EC  81 mg Oral Daily   cholecalciferol  1,000 Units Oral  Daily   guaiFENesin  600 mg Oral BID   heparin  5,000 Units Subcutaneous Q8H   oseltamivir  75 mg Oral BID   pantoprazole (PROTONIX) IV  40 mg Intravenous Q24H   Continuous Infusions:  ceFEPime (MAXIPIME) IV     metronidazole     vancomycin       LOS: 1 day    Time spent: 28 min    Desma Maxim, MD [facility]   If 7PM-7AM, please contact night-coverage [email or web address] Password [email or web address] 01/09/2021, 8:02 AM

## 2021-01-10 LAB — BASIC METABOLIC PANEL
Anion gap: 7 (ref 5–15)
BUN: 15 mg/dL (ref 6–20)
CO2: 31 mmol/L (ref 22–32)
Calcium: 7.9 mg/dL — ABNORMAL LOW (ref 8.9–10.3)
Chloride: 99 mmol/L (ref 98–111)
Creatinine, Ser: 0.94 mg/dL (ref 0.61–1.24)
GFR, Estimated: >60 mL/min (ref >60)
Glucose, Bld: 105 mg/dL — ABNORMAL HIGH (ref 70–99)
Potassium: 2.9 mmol/L — ABNORMAL LOW (ref 3.5–5.1)
Sodium: 137 mmol/L (ref 135–145)

## 2021-01-10 LAB — URINE CULTURE: Culture: <10000 — AB

## 2021-01-10 LAB — CBC
HCT: 36.3 % — ABNORMAL LOW (ref 39.0–52.0)
Hemoglobin: 11.2 g/dL — ABNORMAL LOW (ref 13.0–17.0)
MCH: 25.3 pg — ABNORMAL LOW (ref 26.0–34.0)
MCHC: 30.9 g/dL (ref 30.0–36.0)
MCV: 82.1 fL (ref 80.0–100.0)
Platelets: 250 10*3/uL (ref 150–400)
RBC: 4.42 MIL/uL (ref 4.22–5.81)
RDW: 15.6 % — ABNORMAL HIGH (ref 11.5–15.5)
WBC: 4.4 10*3/uL (ref 4.0–10.5)
nRBC: 0 % (ref 0.0–0.2)

## 2021-01-10 LAB — MAGNESIUM: Magnesium: 2.3 mg/dL (ref 1.7–2.4)

## 2021-01-10 LAB — HEPATITIS C ANTIBODY: HCV Ab: NONREACTIVE

## 2021-01-10 MED ORDER — AMOXICILLIN-POT CLAVULANATE 875-125 MG PO TABS
1.0000 | ORAL_TABLET | Freq: Two times a day (BID) | ORAL | Status: AC
  Administered 2021-01-10 – 2021-01-14 (×10): 1 via ORAL
  Filled 2021-01-10 (×10): qty 1

## 2021-01-10 MED ORDER — IPRATROPIUM-ALBUTEROL 0.5-2.5 (3) MG/3ML IN SOLN
3.0000 mL | Freq: Four times a day (QID) | RESPIRATORY_TRACT | Status: DC
  Administered 2021-01-10 – 2021-01-11 (×4): 3 mL via RESPIRATORY_TRACT
  Filled 2021-01-10 (×3): qty 3

## 2021-01-10 MED ORDER — GUAIFENESIN 100 MG/5ML PO LIQD
15.0000 mL | Freq: Four times a day (QID) | ORAL | Status: DC
  Filled 2021-01-10 (×5): qty 15

## 2021-01-10 MED ORDER — POTASSIUM CHLORIDE CRYS ER 20 MEQ PO TBCR
40.0000 meq | EXTENDED_RELEASE_TABLET | Freq: Two times a day (BID) | ORAL | Status: AC
  Administered 2021-01-10 (×2): 40 meq via ORAL
  Filled 2021-01-10 (×2): qty 2

## 2021-01-10 MED ORDER — GUAIFENESIN 100 MG/5ML PO LIQD
15.0000 mL | Freq: Four times a day (QID) | ORAL | Status: DC
  Administered 2021-01-10 – 2021-02-12 (×128): 15 mL via ORAL
  Filled 2021-01-10 (×14): qty 20
  Filled 2021-01-10: qty 15
  Filled 2021-01-10 (×26): qty 20
  Filled 2021-01-10: qty 15
  Filled 2021-01-10 (×72): qty 20
  Filled 2021-01-10: qty 15
  Filled 2021-01-10 (×4): qty 20
  Filled 2021-01-10: qty 15
  Filled 2021-01-10 (×19): qty 20

## 2021-01-10 NOTE — Evaluation (Signed)
Physical Therapy Evaluation Patient Details Name: Christian [name] MRN: [id number] DOB: June 07, 1975 Today's Date: 01/10/2021  History of Present Illness  Pt is a 45 y/o M admitted on 01/08/21 with c/c of SOB. Pt noted to be positive for Influenza A. Pt noted to have cellulitic area to R buttock. Imaging reveals multifocal PNA in the R lung. PMH: morbid obesity, HTN  Clinical Impression  Pt seen for PT evaluation with pt agreeable to tx. Pt on 8L/min during session with SpO2 >/= 87%. Pt is able to complete bed mobility, transfers & gait with RW & mod I. Pt ambulates a max of 50 ft into the hallway with RW. Pt endorses weakness but is motivated to improve. Will continue to follow pt acutely to progress gait with LRAD & address balance, endurance, strength & activity tolerance.       Recommendations for follow up therapy are one component of a multi-disciplinary discharge planning process, led by the attending physician.  Recommendations may be updated based on patient status, additional functional criteria and insurance authorization.  Follow Up Recommendations Home health PT    Assistance Recommended at Discharge Intermittent Supervision/Assistance  Functional Status Assessment Patient has had a recent decline in their functional status and demonstrates the ability to make significant improvements in function in a reasonable and predictable amount of time.  Equipment Recommendations  Rolling walker (2 wheels) (bariatric)    Recommendations for Other Services       Precautions / Restrictions Precautions Precautions: None Restrictions Weight Bearing Restrictions: No      Mobility  Bed Mobility Overal bed mobility: Modified Independent             General bed mobility comments: supine>sit with use of bed rails    Transfers Overall transfer level: Needs assistance Equipment used: Rolling walker (2 wheels) Transfers: Sit to/from Stand Sit to Stand: Supervision;Modified  independent (Device/Increase time)           General transfer comment: 2 attempts with supervision & PT reviewing hand placement for successful sit>stand upon first try, otherwise able to do so with mod I    Ambulation/Gait Ambulation/Gait assistance: Modified independent (Device/Increase time) Gait Distance (Feet):  (20 ft + 50 ft + 15 ft) Assistive device: Rolling walker (2 wheels) Gait Pattern/deviations: Decreased step length - left;Decreased step length - right;Decreased dorsiflexion - right;Decreased dorsiflexion - left;Decreased stride length Gait velocity: decreased     General Gait Details: decreased heel strike BLE, forward trunk lean on RW, reviewed side stepping in small spaces  Stairs            Wheelchair Mobility    Modified Rankin (Stroke Patients Only)       Balance Overall balance assessment: Needs assistance Sitting-balance support: Feet supported Sitting balance-[name]: Normal       Standing balance-[name]: Fair Standing balance comment: BUE support on RW during gait                             Pertinent Vitals/Pain Pain Assessment: Faces Faces Pain Scale: Hurts a little bit Pain Location: "sores" on feet Pain Descriptors / Indicators: Discomfort Pain Intervention(s): Monitored during session    Home Living Family/patient expects to be discharged to:: Shelter/Homeless                   Additional Comments: Pt was living in homeless shelter prior to admission.    Prior Function Prior Level of Function :  Independent/Modified Independent             Mobility Comments: Pt reports he had progressed to ambulating without AD but used RW to aide with sit>stand.       Hand Dominance        Extremity/Trunk Assessment   Upper Extremity Assessment Upper Extremity Assessment: Generalized weakness    Lower Extremity Assessment Lower Extremity Assessment: Generalized weakness       Communication    Communication: No difficulties  Cognition Arousal/Alertness: Awake/alert Behavior During Therapy: WFL for tasks assessed/performed Overall Cognitive Status: Within Functional Limits for tasks assessed                                          General Comments General comments (skin integrity, edema, etc.): Pt initially on 10L/min via HFNC but titrated down to 8L/min per direction of nurse, SPO2 >/= 87% during session (lowest of 87% after ambulating 50 ft but recovers to >/= 90% quickly)    Exercises     Assessment/Plan    PT Assessment Patient needs continued PT services  PT Problem List Decreased strength;Decreased mobility;Decreased balance;Decreased activity tolerance;Cardiopulmonary status limiting activity       PT Treatment Interventions DME instruction;Therapeutic activities;Modalities;Gait training;Therapeutic exercise;Patient/family education;Stair training;Balance training;Functional mobility training;Manual techniques    PT Goals (Current goals can be found in the Care Plan section)  Acute Rehab PT Goals Patient Stated Goal: get better PT Goal Formulation: With patient Time For Goal Achievement: 01/24/21 Potential to Achieve Goals: Good    Frequency Min 2X/week   Barriers to discharge Decreased caregiver support      Co-evaluation               AM-PAC PT "6 Clicks" Mobility  Outcome Measure Help needed turning from your back to your side while in a flat bed without using bedrails?: None Help needed moving from lying on your back to sitting on the side of a flat bed without using bedrails?: A Little Help needed moving to and from a bed to a chair (including a wheelchair)?: None Help needed standing up from a chair using your arms (e.g., wheelchair or bedside chair)?: None Help needed to walk in hospital room?: None Help needed climbing 3-5 steps with a railing? : A Little 6 Click Score: 22    End of Session Equipment Utilized During  Treatment: Oxygen Activity Tolerance: Patient tolerated treatment well Patient left:  (on toilet in bathroom in care of NT) Nurse Communication: Mobility status PT Visit Diagnosis: Muscle weakness (generalized) (M62.81)    Time: 9030-0923 PT Time Calculation (min) (ACUTE ONLY): 35 min   Charges:   PT Evaluation $PT Eval Moderate Complexity: 1 Mod PT Treatments $Therapeutic Activity: 23-37 mins        Aleda Grana, PT, DPT 01/10/21, 1:07 PM   Sandi Mariscal 01/10/2021, 1:06 PM

## 2021-01-10 NOTE — Progress Notes (Signed)
PROGRESS NOTE    Christian [name]  [id number] DOB: [birth year] DOA: 01/08/2021 PCP: Pcp, No  Outpatient Specialists: none    Brief Narrative:   From admission h and p Christian [name] is a 45 y.o. male seen in ed with complaints of sob, that started yesterday  Shortness of breath has progressed and is worse today along with cough when seen by EMS patient was found to be 80% on O2 sats at room air and was started on a nonrebreather.  Patient also had a fever and had a cellulitic area on his right buttock that is being also treated by the wound clinic for one month .  Patient initially with respiratory distress and respiratory failure and hypoxia and required BiPAP, code sepsis was activated.  Patient was given vancomycin, cefepime and metronidazole along with Tamiflu.     Assessment & Plan:   Principal Problem:   Shortness of breath Active Problems:   Sepsis ([place])   Essential hypertension   Acute respiratory failure with hypoxia (HCC)   Chronic diastolic CHF (congestive heart failure) (HCC)   Cellulitis and abscess of buttock   Prediabetes   # Influenza A # Sepsis Here symptomatic with fever, hypoxia, tachypnea. CTA neg for PE. CTA does show multifocal pneumonia. Procal 0.2, low concern for bacterial pneumonia - continue tamiflu (11/11>)  # Acute hypoxic respiratory failure 2/2 flu, obesity hypoventilation syndrome - continue bipap at night, is on 10 L [place] O2, wean as able  # Obesity hypoventilation syndrome - will plan on continuing qhs bipap  # Pressure ulcer # Cellulitis Stage 3 to right upper thigh. Surrounding erythema though no notable induration, no abscess seen on CT - wound care consult - de-escalate abx to augmentin will plan for total 7 days of abx (through 11/17)  # HFpEF Difficult to ascertain volume status given body habitus. Does have trace pleural effusions and LE edema with mildly elevated bnp - resumed home lasix 40 po bid  #  Hypokalemia Likely 2/2 diuretics. 2.9 this morning - kcl 40 bid today - f/u Mg  # Homelessness - TOC consult  # Debility - PT consulted    DVT prophylaxis: lovenox Code Status: full Family Communication: none (patient says he is estranged from family)  Level of care: Progressive Status is: Inpatient  Remains inpatient appropriate because: severity of illness        Consultants:  none  Procedures: none  Antimicrobials:  Vanc/cefepime/flagyl 11/11 Ancef 11/12>11/13 Augmentin 11/13>   Subjective: This morning says breathing feels somewhat better. No abd pain or n/v. No chest pain.   Objective: Vitals:   01/10/21 0212 01/10/21 0411 01/10/21 0742 01/10/21 0841  BP:  122/76 109/70   Pulse:  79 77 76  Resp:  18 20 16   Temp:  98.8 F (37.1 C) 98.7 F (37.1 C)   TempSrc:      SpO2: 93% 98% 95% 95%  Weight:      Height:        Intake/Output Summary (Last 24 hours) at 01/10/2021 0941 Last data filed at 01/10/2021 0900 Gross per 24 hour  Intake 590 ml  Output 500 ml  Net 90 ml   Filed Weights   01/08/21 1759  Weight: (!) 209.7 kg    Examination:  General exam: Appears calm and comfortable. Morbidly obese Respiratory system: rales at bases, no wheeze Cardiovascular system: S1 & S2 heard, RRR. No JVD, murmurs, rubs, gallops or clicks. 1+ pedal edema Gastrointestinal system: Abdomen is obese, soft  and nontender. No organomegaly or masses felt. Normal bowel sounds heard. Healed midline incision Central nervous system: Alert and oriented. No focal neurological deficits. Extremities: Symmetric 5 x 5 power. Skin: several centimeter shallow ulcer right upper posterior thigh w/ surrounding erythema Psychiatry: Judgement and insight appear normal. Mood & affect appropriate.      Data Reviewed: I have personally reviewed following labs and imaging studies  CBC: Recent Labs  Lab 01/08/21 1810 01/09/21 0623 01/10/21 0622  WBC 10.0 5.4 4.4  NEUTROABS  8.9*  --   --   HGB 12.8* 11.2* 11.2*  HCT 40.9 35.9* 36.3*  MCV 80.8 82.0 82.1  PLT 333 257 [id number]   Basic Metabolic Panel: Recent Labs  Lab 01/08/21 1810 01/09/21 0623 01/10/21 0622  NA 134*  --  137  K 3.8  --  2.9*  CL 100  --  99  CO2 27  --  31  GLUCOSE 140*  --  105*  BUN 13  --  15  CREATININE 1.11 1.07 0.94  CALCIUM 8.4*  --  7.9*   GFR: Estimated Creatinine Clearance: 183.1 mL/min (by C-G formula based on SCr of 0.94 mg/dL). Liver Function Tests: Recent Labs  Lab 01/08/21 1810  AST 18  ALT 17  ALKPHOS 69  BILITOT 0.8  PROT 7.5  ALBUMIN 3.5   No results for input(s): LIPASE, AMYLASE in the last 168 hours. No results for input(s): AMMONIA in the last 168 hours. Coagulation Profile: Recent Labs  Lab 01/08/21 1810  INR 1.1   Cardiac Enzymes: No results for input(s): CKTOTAL, CKMB, CKMBINDEX, TROPONINI in the last 168 hours. BNP (last 3 results) No results for input(s): PROBNP in the last 8760 hours. HbA1C: No results for input(s): HGBA1C in the last 72 hours. CBG: No results for input(s): GLUCAP in the last 168 hours. Lipid Profile: No results for input(s): CHOL, HDL, LDLCALC, TRIG, CHOLHDL, LDLDIRECT in the last 72 hours. Thyroid Function Tests: No results for input(s): TSH, T4TOTAL, FREET4, T3FREE, THYROIDAB in the last 72 hours. Anemia Panel: Recent Labs    01/09/21 0623  VITAMINB12 610  FOLATE 19.5  FERRITIN 121  TIBC 230*  IRON 8*  RETICCTPCT 1.4   Urine analysis:    Component Value Date/Time   COLORURINE YELLOW (A) 01/08/2021 1900   APPEARANCEUR CLEAR (A) 01/08/2021 1900   LABSPEC 1.017 01/08/2021 1900   PHURINE 5.0 01/08/2021 1900   GLUCOSEU NEGATIVE 01/08/2021 1900   HGBUR SMALL (A) 01/08/2021 1900   BILIRUBINUR NEGATIVE 01/08/2021 1900   KETONESUR NEGATIVE 01/08/2021 1900   PROTEINUR NEGATIVE 01/08/2021 1900   NITRITE NEGATIVE 01/08/2021 1900   LEUKOCYTESUR NEGATIVE 01/08/2021 1900   Sepsis  Labs: @LABRCNTIP (procalcitonin:4,lacticidven:4)  ) Recent Results (from the past 240 hour(s))  Resp Panel by RT-PCR (Flu A&B, Covid) Nasopharyngeal Swab     Status: Abnormal   Collection Time: 01/08/21  6:10 PM   Specimen: Nasopharyngeal Swab; Nasopharyngeal(NP) swabs in vial transport medium  Result Value Ref Range Status   SARS Coronavirus 2 by RT PCR NEGATIVE NEGATIVE Final    Comment: (NOTE) SARS-CoV-2 target nucleic acids are NOT DETECTED.  The SARS-CoV-2 RNA is generally detectable in upper respiratory specimens during the acute phase of infection. The lowest concentration of SARS-CoV-2 viral copies this assay can detect is 138 copies/mL. A negative result does not preclude SARS-Cov-2 infection and should not be used as the sole basis for treatment or other patient management decisions. A negative result may occur with  improper specimen collection/handling, submission  of specimen other than nasopharyngeal swab, presence of viral mutation(s) within the areas targeted by this assay, and inadequate number of viral copies(<138 copies/mL). A negative result must be combined with clinical observations, patient history, and epidemiological information. The expected result is Negative.  Fact Sheet for Patients:  EntrepreneurPulse.com.au  Fact Sheet for Healthcare Providers:  IncredibleEmployment.be  This test is no t yet approved or cleared by the [place] FDA and  has been authorized for detection and/or diagnosis of SARS-CoV-2 by FDA under an Emergency Use Authorization (EUA). This EUA will remain  in effect (meaning this test can be used) for the duration of the COVID-19 declaration under Section 564(b)(1) of the Act, 21 U.S.C.section 360bbb-3(b)(1), unless the authorization is terminated  or revoked sooner.       Influenza A by PCR POSITIVE (A) NEGATIVE Final   Influenza B by PCR NEGATIVE NEGATIVE Final    Comment: (NOTE) The  Xpert Xpress SARS-CoV-2/FLU/RSV plus assay is intended as an aid in the diagnosis of influenza from Nasopharyngeal swab specimens and should not be used as a sole basis for treatment. Nasal washings and aspirates are unacceptable for Xpert Xpress SARS-CoV-2/FLU/RSV testing.  Fact Sheet for Patients: EntrepreneurPulse.com.au  Fact Sheet for Healthcare Providers: IncredibleEmployment.be  This test is not yet approved or cleared by the [place] FDA and has been authorized for detection and/or diagnosis of SARS-CoV-2 by FDA under an Emergency Use Authorization (EUA). This EUA will remain in effect (meaning this test can be used) for the duration of the COVID-19 declaration under Section 564(b)(1) of the Act, 21 U.S.C. section 360bbb-3(b)(1), unless the authorization is terminated or revoked.  Performed at [facility], [address], [place], [place] [address]   Blood Culture (routine x 2)     Status: None (Preliminary result)   Collection Time: 01/08/21  6:10 PM   Specimen: BLOOD  Result Value Ref Range Status   Specimen Description   Final    BLOOD L CHEST Performed at [facility], [address], [place], [place] [address]    Special Requests   Final    BOTTLES DRAWN AEROBIC AND ANAEROBIC Blood Culture results may not be optimal due to an inadequate volume of blood received in culture bottles Performed at [facility], [address] [place], [place] [address]    Culture  Setup Time   Final    GRAM POSITIVE COCCI AEROBIC BOTTLE ONLY CRITICAL RESULT CALLED TO, READ BACK BY AND VERIFIED WITH: RAQUEL RODRIGUEZ 01/09/21 1545 AMK Performed at [facility], [place] [address] [place],  [address]    Culture GRAM POSITIVE COCCI  Final   Report Status PENDING  Incomplete  Blood Culture ID Panel (Reflexed)     Status: Abnormal   Collection Time: 01/08/21  6:10 PM  Result Value Ref Range Status    Enterococcus faecalis NOT DETECTED NOT DETECTED Final   Enterococcus Faecium NOT DETECTED NOT DETECTED Final   Listeria monocytogenes NOT DETECTED NOT DETECTED Final   Staphylococcus species DETECTED (A) NOT DETECTED Final    Comment: CRITICAL RESULT CALLED TO, READ BACK BY AND VERIFIED WITH: RAQUEL RODRIGUEZ 01/09/21 1545 AMK    Staphylococcus aureus (BCID) NOT DETECTED NOT DETECTED Final   Staphylococcus epidermidis NOT DETECTED NOT DETECTED Final   Staphylococcus lugdunensis NOT DETECTED NOT DETECTED Final   Streptococcus species NOT DETECTED NOT DETECTED Final   Streptococcus agalactiae NOT DETECTED NOT DETECTED Final   Streptococcus pneumoniae NOT DETECTED NOT DETECTED Final  Streptococcus pyogenes NOT DETECTED NOT DETECTED Final   A.calcoaceticus-baumannii NOT DETECTED NOT DETECTED Final   Bacteroides fragilis NOT DETECTED NOT DETECTED Final   Enterobacterales NOT DETECTED NOT DETECTED Final   Enterobacter cloacae complex NOT DETECTED NOT DETECTED Final   Escherichia coli NOT DETECTED NOT DETECTED Final   Klebsiella aerogenes NOT DETECTED NOT DETECTED Final   Klebsiella oxytoca NOT DETECTED NOT DETECTED Final   Klebsiella pneumoniae NOT DETECTED NOT DETECTED Final   Proteus species NOT DETECTED NOT DETECTED Final   Salmonella species NOT DETECTED NOT DETECTED Final   Serratia marcescens NOT DETECTED NOT DETECTED Final   Haemophilus influenzae NOT DETECTED NOT DETECTED Final   Neisseria meningitidis NOT DETECTED NOT DETECTED Final   Pseudomonas aeruginosa NOT DETECTED NOT DETECTED Final   Stenotrophomonas maltophilia NOT DETECTED NOT DETECTED Final   Candida albicans NOT DETECTED NOT DETECTED Final   Candida auris NOT DETECTED NOT DETECTED Final   Candida glabrata NOT DETECTED NOT DETECTED Final   Candida krusei NOT DETECTED NOT DETECTED Final   Candida parapsilosis NOT DETECTED NOT DETECTED Final   Candida tropicalis NOT DETECTED NOT DETECTED Final   Cryptococcus  neoformans/gattii NOT DETECTED NOT DETECTED Final    Comment: Performed at [facility], [address] [place], [place] [address]  Blood Culture (routine x 2)     Status: None (Preliminary result)   Collection Time: 01/08/21  6:15 PM   Specimen: BLOOD  Result Value Ref Range Status   Specimen Description BLOOD RIGHT ANTECUBITAL  Final   Special Requests   Final    BOTTLES DRAWN AEROBIC AND ANAEROBIC Blood Culture adequate volume   Culture   Final    NO GROWTH 2 DAYS Performed at [facility], [address] [place], [place] [address]    Report Status PENDING  Incomplete  Urine Culture     Status: Abnormal   Collection Time: 01/08/21  7:00 PM   Specimen: Urine, Random  Result Value Ref Range Status   Specimen Description   Final    URINE, RANDOM Performed at [facility], [address], [place], [place] [address]    Special Requests   Final    NONE Performed at [facility], [address], [place], [place] [address]    Culture (A)  Final    <10,000 COLONIES/mL INSIGNIFICANT GROWTH Performed at [facility], [address], [place], [place] [address]    Report Status 01/10/2021 FINAL  Final  CULTURE, BLOOD (ROUTINE X 2) w Reflex to ID Panel     Status: None (Preliminary result)   Collection Time: 01/09/21  6:13 PM   Specimen: BLOOD  Result Value Ref Range Status   Specimen Description BLOOD BLOOD RIGHT HAND  Final   Special Requests   Final    BOTTLES DRAWN AEROBIC AND ANAEROBIC Blood Culture results may not be optimal due to an inadequate volume of blood received in culture bottles   Culture   Final    NO GROWTH < 12 HOURS Performed at [facility], [address] [place], [place] [address]    Report Status PENDING  Incomplete  CULTURE, BLOOD (ROUTINE X 2) w Reflex to ID Panel     Status: None (Preliminary result)   Collection Time: 01/09/21  6:25 PM   Specimen: BLOOD  Result Value Ref Range Status   Specimen  Description BLOOD BLOOD LEFT HAND  Final   Special Requests   Final    BOTTLES DRAWN  AEROBIC AND ANAEROBIC Blood Culture adequate volume   Culture   Final    NO GROWTH < 12 HOURS Performed at [facility], [address] [place], [place] [address]    Report Status PENDING  Incomplete         Radiology Studies: CT Angio Chest Pulmonary Embolism (PE) W or WO Contrast  Result Date: 01/09/2021 CLINICAL DATA:  Hypoxia and shortness of breath.  Elevated D-dimer. EXAM: CT ANGIOGRAPHY CHEST WITH CONTRAST TECHNIQUE: Multidetector CT imaging of the chest was performed using the standard protocol during bolus administration of intravenous contrast. Multiplanar CT image reconstructions and MIPs were obtained to evaluate the vascular anatomy. CONTRAST:  OMNIPAQUE IOHEXOL 350 MG/ML SOLN COMPARISON:  Chest x-ray from yesterday. FINDINGS: Cardiovascular: Suboptimal opacification of the segmental pulmonary arteries. No central or lobar pulmonary embolism. Normal heart size. No pericardial effusion. No thoracic aortic aneurysm or dissection. Mediastinum/Nodes: Few mildly enlarged mediastinal and right hilar lymph nodes measuring up to 1.1 cm in short axis, likely reactive. No enlarged axillary lymph nodes. Thyroid gland, trachea, and esophagus demonstrate no significant findings. Lungs/Pleura: Prominent right middle and lower lobe peribronchial thickening and peribronchovascular consolidation with scattered patchy irregular opacities. This involves the posterior right upper lobe to lesser extent as well. Trace right pleural effusion. Linear scarring in the right upper lobe. 5 mm pulmonary nodule in the right lower lobe (series 7, image 63). 7 mm pulmonary nodule in the left lower lobe (series 7, image 76). No pneumothorax. Upper Abdomen: No acute abnormality. Musculoskeletal: No chest wall abnormality. No acute or significant osseous findings. Review of the MIP images confirms the above findings.  IMPRESSION: 1. Suboptimal opacification of the segmental pulmonary arteries. No central or lobar pulmonary embolism. 2. Multifocal pneumonia in the right lung. 3. Trace right pleural effusion. 4. Bilateral pulmonary nodules measuring up to 7 mm. Recommend a non-contrast Chest CT at 3-6 months. If patient is high risk for malignancy, recommend an additional non-contrast Chest CT at 18-24 months; if patient is low risk for malignancy a non-contrast Chest CT at 18-24 months is optional. These guidelines do not apply to immunocompromised patients and patients with cancer. Follow up in patients with significant comorbidities as clinically warranted. For lung cancer screening, adhere to Lung-RADS guidelines. Reference: Radiology. 2017; 284(1):228-43. Electronically Signed   By: Obie Dredge M.D.   On: 01/09/2021 17:31   CT ABDOMEN PELVIS W CONTRAST  Result Date: 01/08/2021 CLINICAL DATA:  Right lower quadrant pain EXAM: CT ABDOMEN AND PELVIS WITH CONTRAST TECHNIQUE: Multidetector CT imaging of the abdomen and pelvis was performed using the standard protocol following bolus administration of intravenous contrast. CONTRAST:  OMNIPAQUE IOHEXOL 300 MG/ML  SOLN COMPARISON:  CT 10/15/2020 FINDINGS: Lower chest: Lung bases demonstrate patchy densities in the right lung base. There is a small right-sided pleural effusion. Hepatobiliary: No focal liver abnormality is seen. No gallstones, gallbladder wall thickening, or biliary dilatation. Pancreas: Unremarkable. No pancreatic ductal dilatation or surrounding inflammatory changes. Spleen: Normal in size without focal abnormality. Adrenals/Urinary Tract: Adrenal glands are normal. Cyst in the left kidney. No hydronephrosis. Delayed excretion of contrast consistent with decreased renal function. Bladder normal Stomach/Bowel: Stomach nonenlarged. No dilated small bowel. Negative appendix. No acute bowel wall thickening. Vascular/Lymphatic: Nonaneurysmal aorta. No  suspicious nodes. There are multiple enlarged right inguinal lymph nodes. Reproductive: Prostate is unremarkable. Other: Negative for pelvic effusion or free air. Small fat containing left inguinal hernia. Heterogenous density with internal fat slightly to the left of midline  within the upper pelvis measuring 5.4 cm, series 2, image 79. Musculoskeletal: No acute osseous abnormality IMPRESSION: 1. Negative for acute appendicitis. 2. Heterogenous mass with internal fat density slightly to the left of midline in the lower abdomen/upper pelvis. Findings could be secondary to fat necrosis and postsurgical change. Other consideration could include omental infarct. 3. Small right pleural effusion with patchy heterogeneous densities in the right base suspicious for respiratory infection/pneumonia 4. Delayed excretion of contrast from kidneys consistent with decreased renal function. Correlate with appropriate laboratory values. 5. Multiple enlarged right inguinal lymph nodes Electronically Signed   By: Jasmine Pang M.D.   On: 01/08/2021 21:43   CT FEMUR RIGHT W CONTRAST  Result Date: 01/08/2021 CLINICAL DATA:  Soft tissue infection cellulitis with wound EXAM: CT OF THE LOWER RIGHT EXTREMITY WITH CONTRAST TECHNIQUE: Multidetector CT imaging of the lower right extremity was performed according to the standard protocol following intravenous contrast administration. CONTRAST:  OMNIPAQUE IOHEXOL 300 MG/ML  SOLN COMPARISON:  None. FINDINGS: Bones/Joint/Cartilage No fracture or malalignment. No significant right hip effusion. No periostitis or bony destructive change. Ligaments Suboptimally assessed by CT. Muscles and Tendons No intramuscular fluid collections.  No significant atrophy. Soft tissues Skin thickening of the right gluteal region. Negative for focal fluid collection to suggest soft tissue abscess. Mild nonspecific subcutaneous edema within the right thigh. IMPRESSION: 1. No acute osseous abnormality 2.  Prominent skin thickening over the right gluteal region. Negative for soft tissue abscess. Nonspecific mild subcutaneous edema within the right thigh and gluteal region. Electronically Signed   By: Jasmine Pang M.D.   On: 01/08/2021 21:33   US Venous Img Lower Bilateral (DVT)  Result Date: 01/09/2021 CLINICAL DATA:  Positive D-dimer. Shortness of breath with cellulitis. EXAM: BILATERAL LOWER EXTREMITY VENOUS DOPPLER ULTRASOUND TECHNIQUE: Gray-scale sonography with compression, as well as color and duplex ultrasound, were performed to evaluate the deep venous system(s) from the level of the common femoral vein through the popliteal and proximal calf veins. COMPARISON:  None. FINDINGS: VENOUS Normal compressibility of the common femoral, superficial femoral, and popliteal veins, as well as the visualized calf veins. Visualized portions of profunda femoral vein and great saphenous vein unremarkable. No filling defects to suggest DVT on grayscale or color Doppler imaging. Doppler waveforms show normal direction of venous flow, normal respiratory plasticity and response to augmentation. The peroneal veins could not be visualized on either side, likely due to body habitus. IMPRESSION: Negative for DVT in the lower extremities. Electronically Signed   By: Tiburcio Pea M.D.   On: 01/09/2021 09:53   DG Chest Port 1 View  Result Date: 01/08/2021 CLINICAL DATA:  Questionable sepsis - evaluate for abnormality EXAM: PORTABLE CHEST 1 VIEW COMPARISON:  Chest radiograph 12/09/2020 FINDINGS: Unchanged cardiomediastinal silhouette. There are diffuse interstitial opacities and right greater than left mid to lower lung opacities. There is a small right pleural effusion. No visible pneumothorax. There is no acute osseous abnormality. IMPRESSION: Diffuse interstitial opacities and bilateral mid to lower lung opacities, right greater than left, favored to represent pulmonary edema. Multifocal infection is possible. Small  right pleural effusion. Electronically Signed   By: Caprice Renshaw M.D.   On: 01/08/2021 18:48        Scheduled Meds:  albuterol  2.5 mg Nebulization Q6H   enoxaparin (LOVENOX) injection  0.5 mg/kg Subcutaneous Q24H   furosemide  40 mg Oral BID   guaiFENesin  600 mg Oral BID   oseltamivir  75 mg Oral BID  potassium chloride  40 mEq Oral BID   sertraline  50 mg Oral Daily   Continuous Infusions:   ceFAZolin (ANCEF) IV 2 g (01/10/21 0857)     LOS: 2 days    Time spent: 25 min    Desma Maxim, MD [facility]   If 7PM-7AM, please contact night-coverage [email or web address] Password [facility] 01/10/2021, 9:41 AM

## 2021-01-10 NOTE — Plan of Care (Signed)
Patient slept well on BiPAP, C/O having constipation and passing gas, no BM. VSS, monitored closely  Problem: Clinical Measurements: Goal: Will remain free from infection 01/10/2021 0640 by Esaw Grandchild, RN Outcome: Progressing 01/10/2021 0145 by Esaw Grandchild, RN Outcome: Progressing   Problem: Activity: Goal: Risk for activity intolerance will decrease 01/10/2021 0640 by Esaw Grandchild, RN Outcome: Progressing 01/10/2021 0145 by Esaw Grandchild, RN Outcome: Progressing   Problem: Nutrition: Goal: Adequate nutrition will be maintained 01/10/2021 0640 by Esaw Grandchild, RN Outcome: Progressing 01/10/2021 0145 by Esaw Grandchild, RN Outcome: Progressing   Problem: Coping: Goal: Level of anxiety will decrease 01/10/2021 0640 by Esaw Grandchild, RN Outcome: Progressing 01/10/2021 0145 by Esaw Grandchild, RN Outcome: Progressing

## 2021-01-10 NOTE — Plan of Care (Signed)
  Problem: Clinical Measurements: Goal: Will remain free from infection Outcome: Progressing   Problem: Activity: Goal: Risk for activity intolerance will decrease Outcome: Progressing   Problem: Nutrition: Goal: Adequate nutrition will be maintained Outcome: Progressing   Problem: Coping: Goal: Level of anxiety will decrease Outcome: Progressing   

## 2021-01-11 LAB — BASIC METABOLIC PANEL
Anion gap: 6 (ref 5–15)
BUN: 13 mg/dL (ref 6–20)
CO2: 32 mmol/L (ref 22–32)
Calcium: 7.9 mg/dL — ABNORMAL LOW (ref 8.9–10.3)
Chloride: 100 mmol/L (ref 98–111)
Creatinine, Ser: 0.81 mg/dL (ref 0.61–1.24)
GFR, Estimated: >60 mL/min (ref >60)
Glucose, Bld: 95 mg/dL (ref 70–99)
Potassium: 3.4 mmol/L — ABNORMAL LOW (ref 3.5–5.1)
Sodium: 138 mmol/L (ref 135–145)

## 2021-01-11 LAB — CULTURE, BLOOD (ROUTINE X 2)

## 2021-01-11 LAB — MAGNESIUM: Magnesium: 2.2 mg/dL (ref 1.7–2.4)

## 2021-01-11 LAB — HEPARIN ANTI-XA: Heparin LMW: 0.1 IU/mL

## 2021-01-11 MED ORDER — ACETYLCYSTEINE 20 % IN SOLN
2.0000 mL | Freq: Two times a day (BID) | RESPIRATORY_TRACT | Status: DC
  Administered 2021-01-11 – 2021-01-20 (×20): 2 mL via RESPIRATORY_TRACT
  Filled 2021-01-11 (×23): qty 4

## 2021-01-11 MED ORDER — POTASSIUM CHLORIDE CRYS ER 20 MEQ PO TBCR
40.0000 meq | EXTENDED_RELEASE_TABLET | Freq: Every day | ORAL | Status: AC
  Administered 2021-01-11 – 2021-01-12 (×2): 40 meq via ORAL
  Filled 2021-01-11 (×2): qty 2

## 2021-01-11 MED ORDER — IPRATROPIUM-ALBUTEROL 0.5-2.5 (3) MG/3ML IN SOLN
3.0000 mL | RESPIRATORY_TRACT | Status: DC
  Administered 2021-01-11 – 2021-01-15 (×22): 3 mL via RESPIRATORY_TRACT
  Filled 2021-01-11 (×20): qty 3

## 2021-01-11 MED ORDER — OSELTAMIVIR PHOSPHATE 75 MG PO CAPS
75.0000 mg | ORAL_CAPSULE | Freq: Two times a day (BID) | ORAL | Status: AC
  Administered 2021-01-11 – 2021-01-13 (×4): 75 mg via ORAL
  Filled 2021-01-11 (×4): qty 1

## 2021-01-11 NOTE — Progress Notes (Signed)
PROGRESS NOTE    Christian [name]  [id number] DOB: 1975/06/16 DOA: 01/08/2021 PCP: Pcp, No  Outpatient Specialists: none    Brief Narrative:   From admission h and p Christian [name] is a 45 y.o. male seen in ed with complaints of sob, that started yesterday  Shortness of breath has progressed and is worse today along with cough when seen by EMS patient was found to be 80% on O2 sats at room air and was started on a nonrebreather.  Patient also had a fever and had a cellulitic area on his right buttock that is being also treated by the wound clinic for one month .  Patient initially with respiratory distress and respiratory failure and hypoxia and required BiPAP, code sepsis was activated.  Patient was given vancomycin, cefepime and metronidazole along with Tamiflu.     Assessment & Plan:   Principal Problem:   Shortness of breath Active Problems:   Sepsis ([place])   Essential hypertension   Acute respiratory failure with hypoxia (HCC)   Chronic diastolic CHF (congestive heart failure) (HCC)   Cellulitis and abscess of buttock   Prediabetes   # Influenza A # Sepsis Here symptomatic with fever, hypoxia, tachypnea. CTA neg for PE. CTA does show multifocal pneumonia. Procal 0.2, low concern for bacterial pneumonia. Sepsis physiology resolved - continue tamiflu (11/11>)  # Acute hypoxic respiratory failure 2/2 flu, obesity hypoventilation syndrome. Improving.  - currently on 7 L [place] O2, down from 10 yesterday. Continue to wean as able - continue bipap at night   # Obesity hypoventilation syndrome - will plan on continuing qhs bipap  # Pressure ulcer # Cellulitis Stage 3 to right upper thigh. Surrounding erythema though no notable induration, no abscess seen on CT. Erythema somewhat improved w/ abx - wound care consult - have de-escalated abx to augmentin will plan for total 7 days of abx (through 11/17)  # HFpEF Difficult to ascertain volume status given body habitus.  Does have trace pleural effusions and LE edema with mildly elevated bnp - resumed home lasix 40 po bid  # Hypokalemia Likely 2/2 diuretics. 3.4 today - kcl 40 qd  # Homelessness - TOC consult  # Debility - PT consulted, advising HH PT    DVT prophylaxis: lovenox Code Status: full Family Communication: none (patient says he is estranged from family)  Level of care: Progressive Status is: Inpatient  Remains inpatient appropriate because: severity of illness        Consultants:  none  Procedures: none  Antimicrobials:  Vanc/cefepime/flagyl 11/11 Ancef 11/12>11/13 Augmentin 11/13>   Subjective: This morning says breathing feels somewhat better. Cough somewhat productive. No abd pain or n/v. No chest pain.   Objective: Vitals:   01/11/21 0409 01/11/21 0729 01/11/21 1110 01/11/21 1203  BP: 128/89 115/74 123/74   Pulse: 75 77 76 75  Resp: 18 17 17 18   Temp: (!) 97 F (36.1 C) 98 F (36.7 C) 98.7 F (37.1 C)   TempSrc:      SpO2: 96% 95% 95% 94%  Weight:      Height:        Intake/Output Summary (Last 24 hours) at 01/11/2021 1313 Last data filed at 01/11/2021 1300 Gross per 24 hour  Intake 1080 ml  Output 1940 ml  Net -860 ml   Filed Weights   01/08/21 1759  Weight: (!) 209.7 kg    Examination:  General exam: Appears calm and comfortable. Morbidly obese Respiratory system: rales at bases, no  wheeze Cardiovascular system: S1 & S2 heard, RRR. No JVD, murmurs, rubs, gallops or clicks. 1+ pedal edema Gastrointestinal system: Abdomen is obese, soft and nontender. No organomegaly or masses felt. Normal bowel sounds heard. Healed midline incision Central nervous system: Alert and oriented. No focal neurological deficits. Extremities: Symmetric 5 x 5 power. Skin: several centimeter shallow ulcer right upper posterior thigh w/ surrounding erythema Psychiatry: Judgement and insight appear normal. Mood & affect appropriate.  From 11/13:     Data  Reviewed: I have personally reviewed following labs and imaging studies  CBC: Recent Labs  Lab 01/08/21 1810 01/09/21 0623 01/10/21 0622  WBC 10.0 5.4 4.4  NEUTROABS 8.9*  --   --   HGB 12.8* 11.2* 11.2*  HCT 40.9 35.9* 36.3*  MCV 80.8 82.0 82.1  PLT 333 257 [id number]   Basic Metabolic Panel: Recent Labs  Lab 01/08/21 1810 01/09/21 0623 01/10/21 0622 01/11/21 0335  NA 134*  --  137 138  K 3.8  --  2.9* 3.4*  CL 100  --  99 100  CO2 27  --  31 32  GLUCOSE 140*  --  105* 95  BUN 13  --  15 13  CREATININE 1.11 1.07 0.94 0.81  CALCIUM 8.4*  --  7.9* 7.9*  MG  --   --  2.3 2.2   GFR: Estimated Creatinine Clearance: 212.4 mL/min (by C-G formula based on SCr of 0.81 mg/dL). Liver Function Tests: Recent Labs  Lab 01/08/21 1810  AST 18  ALT 17  ALKPHOS 69  BILITOT 0.8  PROT 7.5  ALBUMIN 3.5   No results for input(s): LIPASE, AMYLASE in the last 168 hours. No results for input(s): AMMONIA in the last 168 hours. Coagulation Profile: Recent Labs  Lab 01/08/21 1810  INR 1.1   Cardiac Enzymes: No results for input(s): CKTOTAL, CKMB, CKMBINDEX, TROPONINI in the last 168 hours. BNP (last 3 results) No results for input(s): PROBNP in the last 8760 hours. HbA1C: No results for input(s): HGBA1C in the last 72 hours. CBG: No results for input(s): GLUCAP in the last 168 hours. Lipid Profile: No results for input(s): CHOL, HDL, LDLCALC, TRIG, CHOLHDL, LDLDIRECT in the last 72 hours. Thyroid Function Tests: No results for input(s): TSH, T4TOTAL, FREET4, T3FREE, THYROIDAB in the last 72 hours. Anemia Panel: Recent Labs    01/09/21 0623  VITAMINB12 610  FOLATE 19.5  FERRITIN 121  TIBC 230*  IRON 8*  RETICCTPCT 1.4   Urine analysis:    Component Value Date/Time   COLORURINE YELLOW (A) 01/08/2021 1900   APPEARANCEUR CLEAR (A) 01/08/2021 1900   LABSPEC 1.017 01/08/2021 1900   PHURINE 5.0 01/08/2021 1900   GLUCOSEU NEGATIVE 01/08/2021 1900   HGBUR SMALL (A) 01/08/2021  1900   BILIRUBINUR NEGATIVE 01/08/2021 1900   KETONESUR NEGATIVE 01/08/2021 1900   PROTEINUR NEGATIVE 01/08/2021 1900   NITRITE NEGATIVE 01/08/2021 1900   LEUKOCYTESUR NEGATIVE 01/08/2021 1900   Sepsis Labs: @LABRCNTIP (procalcitonin:4,lacticidven:4)  ) Recent Results (from the past 240 hour(s))  Resp Panel by RT-PCR (Flu A&B, Covid) Nasopharyngeal Swab     Status: Abnormal   Collection Time: 01/08/21  6:10 PM   Specimen: Nasopharyngeal Swab; Nasopharyngeal(NP) swabs in vial transport medium  Result Value Ref Range Status   SARS Coronavirus 2 by RT PCR NEGATIVE NEGATIVE Final    Comment: (NOTE) SARS-CoV-2 target nucleic acids are NOT DETECTED.  The SARS-CoV-2 RNA is generally detectable in upper respiratory specimens during the acute phase of infection. The lowest concentration  of SARS-CoV-2 viral copies this assay can detect is 138 copies/mL. A negative result does not preclude SARS-Cov-2 infection and should not be used as the sole basis for treatment or other patient management decisions. A negative result may occur with  improper specimen collection/handling, submission of specimen other than nasopharyngeal swab, presence of viral mutation(s) within the areas targeted by this assay, and inadequate number of viral copies(<138 copies/mL). A negative result must be combined with clinical observations, patient history, and epidemiological information. The expected result is Negative.  Fact Sheet for Patients:  BloggerCourse.com  Fact Sheet for Healthcare Providers:  SeriousBroker.it  This test is no t yet approved or cleared by the [place] FDA and  has been authorized for detection and/or diagnosis of SARS-CoV-2 by FDA under an Emergency Use Authorization (EUA). This EUA will remain  in effect (meaning this test can be used) for the duration of the COVID-19 declaration under Section 564(b)(1) of the Act,  21 U.S.C.section 360bbb-3(b)(1), unless the authorization is terminated  or revoked sooner.       Influenza A by PCR POSITIVE (A) NEGATIVE Final   Influenza B by PCR NEGATIVE NEGATIVE Final    Comment: (NOTE) The Xpert Xpress SARS-CoV-2/FLU/RSV plus assay is intended as an aid in the diagnosis of influenza from Nasopharyngeal swab specimens and should not be used as a sole basis for treatment. Nasal washings and aspirates are unacceptable for Xpert Xpress SARS-CoV-2/FLU/RSV testing.  Fact Sheet for Patients: BloggerCourse.com  Fact Sheet for Healthcare Providers: SeriousBroker.it  This test is not yet approved or cleared by the [place] FDA and has been authorized for detection and/or diagnosis of SARS-CoV-2 by FDA under an Emergency Use Authorization (EUA). This EUA will remain in effect (meaning this test can be used) for the duration of the COVID-19 declaration under Section 564(b)(1) of the Act, 21 U.S.C. section 360bbb-3(b)(1), unless the authorization is terminated or revoked.  Performed at [facility], [address], [place], [place] [address]   Blood Culture (routine x 2)     Status: Abnormal   Collection Time: 01/08/21  6:10 PM   Specimen: BLOOD  Result Value Ref Range Status   Specimen Description   Final    BLOOD L CHEST Performed at [facility], [address], [place], [place] [address]    Special Requests   Final    BOTTLES DRAWN AEROBIC AND ANAEROBIC Blood Culture results may not be optimal due to an inadequate volume of blood received in culture bottles Performed at [facility], [address] [place], [place] [address]    Culture  Setup Time   Final    GRAM POSITIVE COCCI AEROBIC BOTTLE ONLY CRITICAL RESULT CALLED TO, READ BACK BY AND VERIFIED WITH: RAQUEL RODRIGUEZ 01/09/21 1545 AMK    Culture (A)  Final    STAPHYLOCOCCUS HOMINIS THE SIGNIFICANCE OF ISOLATING  THIS ORGANISM FROM A SINGLE SET OF BLOOD CULTURES WHEN MULTIPLE SETS ARE DRAWN IS UNCERTAIN. PLEASE NOTIFY THE MICROBIOLOGY DEPARTMENT WITHIN ONE WEEK IF SPECIATION AND SENSITIVITIES ARE REQUIRED. Performed at [facility], [address], [place], [place] [address]    Report Status 01/11/2021 FINAL  Final  Blood Culture ID Panel (Reflexed)     Status: Abnormal   Collection Time: 01/08/21  6:10 PM  Result Value Ref Range Status   Enterococcus faecalis NOT DETECTED NOT DETECTED Final   Enterococcus Faecium NOT DETECTED NOT DETECTED Final   Listeria monocytogenes NOT DETECTED NOT DETECTED Final  Staphylococcus species DETECTED (A) NOT DETECTED Final    Comment: CRITICAL RESULT CALLED TO, READ BACK BY AND VERIFIED WITH: RAQUEL RODRIGUEZ 01/09/21 1545 AMK    Staphylococcus aureus (BCID) NOT DETECTED NOT DETECTED Final   Staphylococcus epidermidis NOT DETECTED NOT DETECTED Final   Staphylococcus lugdunensis NOT DETECTED NOT DETECTED Final   Streptococcus species NOT DETECTED NOT DETECTED Final   Streptococcus agalactiae NOT DETECTED NOT DETECTED Final   Streptococcus pneumoniae NOT DETECTED NOT DETECTED Final   Streptococcus pyogenes NOT DETECTED NOT DETECTED Final   A.calcoaceticus-baumannii NOT DETECTED NOT DETECTED Final   Bacteroides fragilis NOT DETECTED NOT DETECTED Final   Enterobacterales NOT DETECTED NOT DETECTED Final   Enterobacter cloacae complex NOT DETECTED NOT DETECTED Final   Escherichia coli NOT DETECTED NOT DETECTED Final   Klebsiella aerogenes NOT DETECTED NOT DETECTED Final   Klebsiella oxytoca NOT DETECTED NOT DETECTED Final   Klebsiella pneumoniae NOT DETECTED NOT DETECTED Final   Proteus species NOT DETECTED NOT DETECTED Final   Salmonella species NOT DETECTED NOT DETECTED Final   Serratia marcescens NOT DETECTED NOT DETECTED Final   Haemophilus influenzae NOT DETECTED NOT DETECTED Final   Neisseria meningitidis NOT DETECTED NOT DETECTED Final   Pseudomonas  aeruginosa NOT DETECTED NOT DETECTED Final   Stenotrophomonas maltophilia NOT DETECTED NOT DETECTED Final   Candida albicans NOT DETECTED NOT DETECTED Final   Candida auris NOT DETECTED NOT DETECTED Final   Candida glabrata NOT DETECTED NOT DETECTED Final   Candida krusei NOT DETECTED NOT DETECTED Final   Candida parapsilosis NOT DETECTED NOT DETECTED Final   Candida tropicalis NOT DETECTED NOT DETECTED Final   Cryptococcus neoformans/gattii NOT DETECTED NOT DETECTED Final    Comment: Performed at [facility], [address] [place], [place] [address]  Blood Culture (routine x 2)     Status: None (Preliminary result)   Collection Time: 01/08/21  6:15 PM   Specimen: BLOOD  Result Value Ref Range Status   Specimen Description BLOOD RIGHT ANTECUBITAL  Final   Special Requests   Final    BOTTLES DRAWN AEROBIC AND ANAEROBIC Blood Culture adequate volume   Culture   Final    NO GROWTH 3 DAYS Performed at [facility], [address] [place], [place] [address]    Report Status PENDING  Incomplete  Urine Culture     Status: Abnormal   Collection Time: 01/08/21  7:00 PM   Specimen: Urine, Random  Result Value Ref Range Status   Specimen Description   Final    URINE, RANDOM Performed at [facility], [address] [place], [place] [address]    Special Requests   Final    NONE Performed at [facility], [address], [place], [place] [address]    Culture (A)  Final    <10,000 COLONIES/mL INSIGNIFICANT GROWTH Performed at [facility], [address], [place], [place] [address]    Report Status 01/10/2021 FINAL  Final  CULTURE, BLOOD (ROUTINE X 2) w Reflex to ID Panel     Status: None (Preliminary result)   Collection Time: 01/09/21  6:13 PM   Specimen: BLOOD  Result Value Ref Range Status   Specimen Description BLOOD BLOOD RIGHT HAND  Final   Special Requests   Final    BOTTLES DRAWN AEROBIC AND ANAEROBIC Blood Culture results may  not be optimal due to an inadequate volume of blood received in culture bottles   Culture   Final  NO GROWTH 2 DAYS Performed at [facility], [address] [place], [place] [address]    Report Status PENDING  Incomplete  CULTURE, BLOOD (ROUTINE X 2) w Reflex to ID Panel     Status: None (Preliminary result)   Collection Time: 01/09/21  6:25 PM   Specimen: BLOOD  Result Value Ref Range Status   Specimen Description BLOOD BLOOD LEFT HAND  Final   Special Requests   Final    BOTTLES DRAWN AEROBIC AND ANAEROBIC Blood Culture adequate volume   Culture   Final    NO GROWTH 2 DAYS Performed at [facility], [address] [place], [place] [address]    Report Status PENDING  Incomplete         Radiology Studies: CT Angio Chest Pulmonary Embolism (PE) W or WO Contrast  Result Date: 01/09/2021 CLINICAL DATA:  Hypoxia and shortness of breath.  Elevated D-dimer. EXAM: CT ANGIOGRAPHY CHEST WITH CONTRAST TECHNIQUE: Multidetector CT imaging of the chest was performed using the standard protocol during bolus administration of intravenous contrast. Multiplanar CT image reconstructions and MIPs were obtained to evaluate the vascular anatomy. CONTRAST:  OMNIPAQUE IOHEXOL 350 MG/ML SOLN COMPARISON:  Chest x-ray from yesterday. FINDINGS: Cardiovascular: Suboptimal opacification of the segmental pulmonary arteries. No central or lobar pulmonary embolism. Normal heart size. No pericardial effusion. No thoracic aortic aneurysm or dissection. Mediastinum/Nodes: Few mildly enlarged mediastinal and right hilar lymph nodes measuring up to 1.1 cm in short axis, likely reactive. No enlarged axillary lymph nodes. Thyroid gland, trachea, and esophagus demonstrate no significant findings. Lungs/Pleura: Prominent right middle and lower lobe peribronchial thickening and peribronchovascular consolidation with scattered patchy irregular opacities. This involves the posterior right upper lobe  to lesser extent as well. Trace right pleural effusion. Linear scarring in the right upper lobe. 5 mm pulmonary nodule in the right lower lobe (series 7, image 63). 7 mm pulmonary nodule in the left lower lobe (series 7, image 76). No pneumothorax. Upper Abdomen: No acute abnormality. Musculoskeletal: No chest wall abnormality. No acute or significant osseous findings. Review of the MIP images confirms the above findings. IMPRESSION: 1. Suboptimal opacification of the segmental pulmonary arteries. No central or lobar pulmonary embolism. 2. Multifocal pneumonia in the right lung. 3. Trace right pleural effusion. 4. Bilateral pulmonary nodules measuring up to 7 mm. Recommend a non-contrast Chest CT at 3-6 months. If patient is high risk for malignancy, recommend an additional non-contrast Chest CT at 18-24 months; if patient is low risk for malignancy a non-contrast Chest CT at 18-24 months is optional. These guidelines do not apply to immunocompromised patients and patients with cancer. Follow up in patients with significant comorbidities as clinically warranted. For lung cancer screening, adhere to Lung-RADS guidelines. Reference: Radiology. 2017; 284(1):228-43. Electronically Signed   By: Obie Dredge M.D.   On: 01/09/2021 17:31        Scheduled Meds:  acetylcysteine  2 mL Nebulization BID   amoxicillin-clavulanate  1 tablet Oral Q12H   enoxaparin (LOVENOX) injection  0.5 mg/kg Subcutaneous Q24H   furosemide  40 mg Oral BID   guaiFENesin  15 mL Oral QID   ipratropium-albuterol  3 mL Nebulization Q4H   oseltamivir  75 mg Oral BID   sertraline  50 mg Oral Daily   Continuous Infusions:     LOS: 3 days    Time spent: 20 min    Silvano Bilis, MD [facility]   If 7PM-7AM, please contact night-coverage [email or web address] Password [email or web address]  01/11/2021, 1:13 PM

## 2021-01-12 ENCOUNTER — Ambulatory Visit: Payer: Self-pay | Admitting: Physician Assistant

## 2021-01-12 LAB — BASIC METABOLIC PANEL
Anion gap: 7 (ref 5–15)
BUN: 10 mg/dL (ref 6–20)
CO2: 33 mmol/L — ABNORMAL HIGH (ref 22–32)
Calcium: 8.2 mg/dL — ABNORMAL LOW (ref 8.9–10.3)
Chloride: 100 mmol/L (ref 98–111)
Creatinine, Ser: 0.69 mg/dL (ref 0.61–1.24)
GFR, Estimated: >60 mL/min (ref >60)
Glucose, Bld: 94 mg/dL (ref 70–99)
Potassium: 3.5 mmol/L (ref 3.5–5.1)
Sodium: 140 mmol/L (ref 135–145)

## 2021-01-12 LAB — MAGNESIUM: Magnesium: 2.1 mg/dL (ref 1.7–2.4)

## 2021-01-12 MED ORDER — SODIUM CHLORIDE 0.9% FLUSH
3.0000 mL | Freq: Two times a day (BID) | INTRAVENOUS | Status: DC
  Administered 2021-01-12 – 2021-01-17 (×11): 3 mL via INTRAVENOUS

## 2021-01-12 NOTE — Progress Notes (Signed)
Physical Therapy Treatment Patient Details Name: Christian [name] MRN: [id number] DOB: 09/01/1975 Today's Date: 01/12/2021   History of Present Illness Pt is a 45 y/o M admitted on 01/08/21 with c/c of SOB. Pt noted to be positive for Influenza A. Pt noted to have cellulitic area to R buttock. Imaging reveals multifocal PNA in the R lung. PMH: morbid obesity, HTN    PT Comments    Pt continuing to make progress towards therapy goals however, continues to be limited by poor activity tolerance and shortness of breath with exertion. Pt oxygen saturation monitored throughout session and maintained >/= 92% SpO2 with all activities on 8L HFNC. Pt requiring prolonged seated rest breaks between exercises due to shortness of breath. Pt reporting 6/10 on the RPE score with standing exercises. Anticipating that patient may be able to discharge with OP PT however, continues to be limited by his current endurance and oxygen requirements at rest and exertion. Will continue to work with patient to improve activity tolerance and overall mobility.   Recommendations for follow up therapy are one component of a multi-disciplinary discharge planning process, led by the attending physician.  Recommendations may be updated based on patient status, additional functional criteria and insurance authorization.  Follow Up Recommendations  Home health PT     Assistance Recommended at Discharge Intermittent Supervision/Assistance  Equipment Recommendations  Rolling walker (2 wheels) (Bariatric size due to body habitus)    Recommendations for Other Services       Precautions / Restrictions Precautions Precautions: None Restrictions Weight Bearing Restrictions: No     Mobility  Bed Mobility Overal bed mobility: Modified Independent             General bed mobility comments: Usage of bed rails for supine <> sit    Transfers Overall transfer level: Modified independent Equipment used: Rolling walker  (2 wheels) Transfers: Sit to/from Stand Sit to Stand: Modified independent (Device/Increase time)           General transfer comment: Usage of bariatric RW and demonstrates good standing balance with initial standing    Ambulation/Gait Ambulation/Gait assistance: Supervision Gait Distance (Feet): 10 Feet Assistive device: Rolling walker (2 wheels) Gait Pattern/deviations: Step-through pattern;Decreased stride length;Trunk flexed Gait velocity: decreased     General Gait Details: Decreased heel strike and forward trunk lean on RW. Demonstrates good safety with turning   Stairs             Wheelchair Mobility    Modified Rankin (Stroke Patients Only)       Balance Overall balance assessment: Needs assistance Sitting-balance support: Feet supported Sitting balance-[name]: Normal     Standing balance support: Bilateral upper extremity supported;During functional activity Standing balance-[name]: Fair Standing balance comment: Requiring B UE support on RW                            Cognition Arousal/Alertness: Awake/alert Behavior During Therapy: WFL for tasks assessed/performed Overall Cognitive Status: Within Functional Limits for tasks assessed                                          Exercises General Exercises - Lower Extremity Long Arc Quad: AROM;Strengthening;Both;20 reps;Seated Hip ABduction/ADduction: AROM;Strengthening;Both;10 reps;Standing Hip Flexion/Marching: AROM;Strengthening;Both;20 reps;Standing Heel Raises: AROM;Strengthening;Both;15 reps;Standing    General Comments General comments (skin integrity, edema, etc.): Pt on 8L  HFNC upon entry into room for treatment. Pt laying supine was 94% SpO2. Pt remained >/ = 92% throughout treatment on 8L even after short distance ambulation. Pt requiring prolonged rest breaks after activities due to reports of SOB      Pertinent Vitals/Pain Pain Assessment:  No/denies pain    Home Living                          Prior Function            PT Goals (current goals can now be found in the care plan section) Acute Rehab PT Goals Patient Stated Goal: get better PT Goal Formulation: With patient Time For Goal Achievement: 01/24/21 Potential to Achieve Goals: Good Progress towards PT goals: Progressing toward goals    Frequency    Min 2X/week      PT Plan Current plan remains appropriate    Co-evaluation              AM-PAC PT "6 Clicks" Mobility   Outcome Measure  Help needed turning from your back to your side while in a flat bed without using bedrails?: None Help needed moving from lying on your back to sitting on the side of a flat bed without using bedrails?: None Help needed moving to and from a bed to a chair (including a wheelchair)?: None Help needed standing up from a chair using your arms (e.g., wheelchair or bedside chair)?: None Help needed to walk in hospital room?: A Little Help needed climbing 3-5 steps with a railing? : A Lot 6 Click Score: 21    End of Session Equipment Utilized During Treatment: Oxygen Activity Tolerance: Patient tolerated treatment well Patient left: in bed;with call bell/phone within reach;with bed alarm set Nurse Communication: Mobility status PT Visit Diagnosis: Muscle weakness (generalized) (M62.81)     Time: 5681-2751 PT Time Calculation (min) (ACUTE ONLY): 33 min  Charges:  $Gait Training: 8-22 mins $Therapeutic Exercise: 8-22 mins                     Verl Blalock, SPT   Verl Blalock 01/12/2021, 1:31 PM

## 2021-01-12 NOTE — Progress Notes (Signed)
PROGRESS NOTE    Christian [name]  [id number] DOB: November 27, 1975 DOA: 01/08/2021 PCP: Pcp, No  Outpatient Specialists: none    Brief Narrative:   From admission h and p Christian [name] is a 45 y.o. male seen in ed with complaints of sob, that started yesterday  Shortness of breath has progressed and is worse today along with cough when seen by EMS patient was found to be 80% on O2 sats at room air and was started on a nonrebreather.  Patient also had a fever and had a cellulitic area on his right buttock that is being also treated by the wound clinic for one month .  Patient initially with respiratory distress and respiratory failure and hypoxia and required BiPAP, code sepsis was activated.  Patient was given vancomycin, cefepime and metronidazole along with Tamiflu.     Assessment & Plan:   Principal Problem:   Shortness of breath Active Problems:   Sepsis ([place])   Essential hypertension   Acute respiratory failure with hypoxia (HCC)   Chronic diastolic CHF (congestive heart failure) (HCC)   Cellulitis and abscess of buttock   Prediabetes   # Influenza A # Sepsis Here symptomatic with fever, hypoxia, tachypnea. CTA neg for PE. CTA does show multifocal pneumonia. Procal 0.2, low concern for bacterial pneumonia. Sepsis physiology resolved - continue tamiflu (11/11>)  # Acute hypoxic respiratory failure 2/2 flu, obesity hypoventilation syndrome. Symptomatically improving. Stable o2, currently on 8L.  - wean as able - continue bipap at night   # Obesity hypoventilation syndrome - will plan on continuing qhs bipap  # Pressure ulcer # Cellulitis Stage 3 to right upper thigh. Surrounding erythema though no notable induration, no abscess seen on CT. Erythema somewhat improved w/ abx - wound care consult - have de-escalated abx to augmentin, will plan for total 7 days of abx (through 11/17)  # HFpEF Difficult to ascertain volume status given body habitus. Does have  trace pleural effusions and LE edema with mildly elevated bnp - resumed home lasix 40 po bid  # Hypokalemia Likely 2/2 diuretics. 3.5 today, Mg wnl. - continue kcl 40 qd  # Homelessness - TOC consult  # Debility - PT consulted, advising HH PT    DVT prophylaxis: lovenox Code Status: full Family Communication: none (patient says he is estranged from family)  Level of care: Progressive Status is: Inpatient  Remains inpatient appropriate because: severity of illness        Consultants:  none  Procedures: none  Antimicrobials:  Vanc/cefepime/flagyl 11/11 Ancef 11/12>11/13 Augmentin 11/13>   Subjective: This morning says breathing feels somewhat better. Cough somewhat productive. No abd pain or n/v. No chest pain.   Objective: Vitals:   01/11/21 2348 01/12/21 0435 01/12/21 0730 01/12/21 0827  BP:  (!) 136/91 136/80   Pulse:  73 76   Resp: 20 20 17    Temp:  (!) 97.3 F (36.3 C) (!) 97.3 F (36.3 C)   TempSrc:      SpO2:  97% 98% 94%  Weight:      Height:        Intake/Output Summary (Last 24 hours) at 01/12/2021 1112 Last data filed at 01/12/2021 0900 Gross per 24 hour  Intake 720 ml  Output 850 ml  Net -130 ml   Filed Weights   01/08/21 1759  Weight: (!) 209.7 kg    Examination:  General exam: Appears calm and comfortable. Morbidly obese Respiratory system: rales at bases, no wheeze Cardiovascular system: S1 &  S2 heard, RRR. No JVD, murmurs, rubs, gallops or clicks. 1+ pedal edema Gastrointestinal system: Abdomen is obese, soft and nontender. No organomegaly or masses felt. Normal bowel sounds heard. Healed midline incision Central nervous system: Alert and oriented. No focal neurological deficits. Extremities: Symmetric 5 x 5 power. Skin: several centimeter shallow ulcer right upper posterior thigh w/ surrounding erythema Psychiatry: Judgement and insight appear normal. Mood & affect appropriate.  From 11/13:     Data Reviewed: I have  personally reviewed following labs and imaging studies  CBC: Recent Labs  Lab 01/08/21 1810 01/09/21 0623 01/10/21 0622  WBC 10.0 5.4 4.4  NEUTROABS 8.9*  --   --   HGB 12.8* 11.2* 11.2*  HCT 40.9 35.9* 36.3*  MCV 80.8 82.0 82.1  PLT 333 257 250   Basic Metabolic Panel: Recent Labs  Lab 01/08/21 1810 01/09/21 0623 01/10/21 0622 01/11/21 0335 01/12/21 0431  NA 134*  --  137 138 140  K 3.8  --  2.9* 3.4* 3.5  CL 100  --  99 100 100  CO2 27  --  31 32 33*  GLUCOSE 140*  --  105* 95 94  BUN 13  --  15 13 10   CREATININE 1.11 1.07 0.94 0.81 0.69  CALCIUM 8.4*  --  7.9* 7.9* 8.2*  MG  --   --  2.3 2.2 2.1   GFR: Estimated Creatinine Clearance: 215.1 mL/min (by C-G formula based on SCr of 0.69 mg/dL). Liver Function Tests: Recent Labs  Lab 01/08/21 1810  AST 18  ALT 17  ALKPHOS 69  BILITOT 0.8  PROT 7.5  ALBUMIN 3.5   No results for input(s): LIPASE, AMYLASE in the last 168 hours. No results for input(s): AMMONIA in the last 168 hours. Coagulation Profile: Recent Labs  Lab 01/08/21 1810  INR 1.1   Cardiac Enzymes: No results for input(s): CKTOTAL, CKMB, CKMBINDEX, TROPONINI in the last 168 hours. BNP (last 3 results) No results for input(s): PROBNP in the last 8760 hours. HbA1C: No results for input(s): HGBA1C in the last 72 hours. CBG: No results for input(s): GLUCAP in the last 168 hours. Lipid Profile: No results for input(s): CHOL, HDL, LDLCALC, TRIG, CHOLHDL, LDLDIRECT in the last 72 hours. Thyroid Function Tests: No results for input(s): TSH, T4TOTAL, FREET4, T3FREE, THYROIDAB in the last 72 hours. Anemia Panel: No results for input(s): VITAMINB12, FOLATE, FERRITIN, TIBC, IRON, RETICCTPCT in the last 72 hours.  Urine analysis:    Component Value Date/Time   COLORURINE YELLOW (A) 01/08/2021 1900   APPEARANCEUR CLEAR (A) 01/08/2021 1900   LABSPEC 1.017 01/08/2021 1900   PHURINE 5.0 01/08/2021 1900   GLUCOSEU NEGATIVE 01/08/2021 1900   HGBUR  SMALL (A) 01/08/2021 1900   BILIRUBINUR NEGATIVE 01/08/2021 1900   KETONESUR NEGATIVE 01/08/2021 1900   PROTEINUR NEGATIVE 01/08/2021 1900   NITRITE NEGATIVE 01/08/2021 1900   LEUKOCYTESUR NEGATIVE 01/08/2021 1900   Sepsis Labs: @LABRCNTIP (procalcitonin:4,lacticidven:4)  ) Recent Results (from the past 240 hour(s))  Resp Panel by RT-PCR (Flu A&B, Covid) Nasopharyngeal Swab     Status: Abnormal   Collection Time: 01/08/21  6:10 PM   Specimen: Nasopharyngeal Swab; Nasopharyngeal(NP) swabs in vial transport medium  Result Value Ref Range Status   SARS Coronavirus 2 by RT PCR NEGATIVE NEGATIVE Final    Comment: (NOTE) SARS-CoV-2 target nucleic acids are NOT DETECTED.  The SARS-CoV-2 RNA is generally detectable in upper respiratory specimens during the acute phase of infection. The lowest concentration of SARS-CoV-2 viral copies this  assay can detect is 138 copies/mL. A negative result does not preclude SARS-Cov-2 infection and should not be used as the sole basis for treatment or other patient management decisions. A negative result may occur with  improper specimen collection/handling, submission of specimen other than nasopharyngeal swab, presence of viral mutation(s) within the areas targeted by this assay, and inadequate number of viral copies(<138 copies/mL). A negative result must be combined with clinical observations, patient history, and epidemiological information. The expected result is Negative.  Fact Sheet for Patients:  EntrepreneurPulse.com.au  Fact Sheet for Healthcare Providers:  IncredibleEmployment.be  This test is no t yet approved or cleared by the [place] FDA and  has been authorized for detection and/or diagnosis of SARS-CoV-2 by FDA under an Emergency Use Authorization (EUA). This EUA will remain  in effect (meaning this test can be used) for the duration of the COVID-19 declaration under Section 564(b)(1) of the  Act, 21 U.S.C.section 360bbb-3(b)(1), unless the authorization is terminated  or revoked sooner.       Influenza A by PCR POSITIVE (A) NEGATIVE Final   Influenza B by PCR NEGATIVE NEGATIVE Final    Comment: (NOTE) The Xpert Xpress SARS-CoV-2/FLU/RSV plus assay is intended as an aid in the diagnosis of influenza from Nasopharyngeal swab specimens and should not be used as a sole basis for treatment. Nasal washings and aspirates are unacceptable for Xpert Xpress SARS-CoV-2/FLU/RSV testing.  Fact Sheet for Patients: EntrepreneurPulse.com.au  Fact Sheet for Healthcare Providers: IncredibleEmployment.be  This test is not yet approved or cleared by the [place] FDA and has been authorized for detection and/or diagnosis of SARS-CoV-2 by FDA under an Emergency Use Authorization (EUA). This EUA will remain in effect (meaning this test can be used) for the duration of the COVID-19 declaration under Section 564(b)(1) of the Act, 21 U.S.C. section 360bbb-3(b)(1), unless the authorization is terminated or revoked.  Performed at [facility], [address] [place], [place] [address]   Blood Culture (routine x 2)     Status: Abnormal   Collection Time: 01/08/21  6:10 PM   Specimen: BLOOD  Result Value Ref Range Status   Specimen Description   Final    BLOOD L CHEST Performed at [facility], [address], [place], [place] [address]    Special Requests   Final    BOTTLES DRAWN AEROBIC AND ANAEROBIC Blood Culture results may not be optimal due to an inadequate volume of blood received in culture bottles Performed at [facility], [address] [place], [place] [address]    Culture  Setup Time   Final    GRAM POSITIVE COCCI AEROBIC BOTTLE ONLY CRITICAL RESULT CALLED TO, READ BACK BY AND VERIFIED WITH: RAQUEL RODRIGUEZ 01/09/21 1545 AMK    Culture (A)  Final    STAPHYLOCOCCUS HOMINIS THE SIGNIFICANCE OF  ISOLATING THIS ORGANISM FROM A SINGLE SET OF BLOOD CULTURES WHEN MULTIPLE SETS ARE DRAWN IS UNCERTAIN. PLEASE NOTIFY THE MICROBIOLOGY DEPARTMENT WITHIN ONE WEEK IF SPECIATION AND SENSITIVITIES ARE REQUIRED. Performed at [facility], [place] [address] [place],  [address]    Report Status 01/11/2021 FINAL  Final  Blood Culture ID Panel (Reflexed)     Status: Abnormal   Collection Time: 01/08/21  6:10 PM  Result Value Ref Range Status   Enterococcus faecalis NOT DETECTED NOT DETECTED Final   Enterococcus Faecium NOT DETECTED NOT DETECTED Final   Listeria monocytogenes NOT DETECTED NOT DETECTED Final   Staphylococcus species DETECTED (A)  NOT DETECTED Final    Comment: CRITICAL RESULT CALLED TO, READ BACK BY AND VERIFIED WITH: RAQUEL RODRIGUEZ 01/09/21 1545 AMK    Staphylococcus aureus (BCID) NOT DETECTED NOT DETECTED Final   Staphylococcus epidermidis NOT DETECTED NOT DETECTED Final   Staphylococcus lugdunensis NOT DETECTED NOT DETECTED Final   Streptococcus species NOT DETECTED NOT DETECTED Final   Streptococcus agalactiae NOT DETECTED NOT DETECTED Final   Streptococcus pneumoniae NOT DETECTED NOT DETECTED Final   Streptococcus pyogenes NOT DETECTED NOT DETECTED Final   A.calcoaceticus-baumannii NOT DETECTED NOT DETECTED Final   Bacteroides fragilis NOT DETECTED NOT DETECTED Final   Enterobacterales NOT DETECTED NOT DETECTED Final   Enterobacter cloacae complex NOT DETECTED NOT DETECTED Final   Escherichia coli NOT DETECTED NOT DETECTED Final   Klebsiella aerogenes NOT DETECTED NOT DETECTED Final   Klebsiella oxytoca NOT DETECTED NOT DETECTED Final   Klebsiella pneumoniae NOT DETECTED NOT DETECTED Final   Proteus species NOT DETECTED NOT DETECTED Final   Salmonella species NOT DETECTED NOT DETECTED Final   Serratia marcescens NOT DETECTED NOT DETECTED Final   Haemophilus influenzae NOT DETECTED NOT DETECTED Final   Neisseria meningitidis NOT DETECTED NOT DETECTED Final    Pseudomonas aeruginosa NOT DETECTED NOT DETECTED Final   Stenotrophomonas maltophilia NOT DETECTED NOT DETECTED Final   Candida albicans NOT DETECTED NOT DETECTED Final   Candida auris NOT DETECTED NOT DETECTED Final   Candida glabrata NOT DETECTED NOT DETECTED Final   Candida krusei NOT DETECTED NOT DETECTED Final   Candida parapsilosis NOT DETECTED NOT DETECTED Final   Candida tropicalis NOT DETECTED NOT DETECTED Final   Cryptococcus neoformans/gattii NOT DETECTED NOT DETECTED Final    Comment: Performed at [facility], [address] [place], [place] [address]  Blood Culture (routine x 2)     Status: None (Preliminary result)   Collection Time: 01/08/21  6:15 PM   Specimen: BLOOD  Result Value Ref Range Status   Specimen Description BLOOD RIGHT ANTECUBITAL  Final   Special Requests   Final    BOTTLES DRAWN AEROBIC AND ANAEROBIC Blood Culture adequate volume   Culture   Final    NO GROWTH 3 DAYS Performed at [facility], [address] [place], [place] [address]    Report Status PENDING  Incomplete  Urine Culture     Status: Abnormal   Collection Time: 01/08/21  7:00 PM   Specimen: Urine, Random  Result Value Ref Range Status   Specimen Description   Final    URINE, RANDOM Performed at [facility], [address] [place], [place] [address]    Special Requests   Final    NONE Performed at [facility], [address] [place], [place] [address]    Culture (A)  Final    <10,000 COLONIES/mL INSIGNIFICANT GROWTH Performed at [facility], [address] [place], [place] [address]    Report Status 01/10/2021 FINAL  Final  CULTURE, BLOOD (ROUTINE X 2) w Reflex to ID Panel     Status: None (Preliminary result)   Collection Time: 01/09/21  6:13 PM   Specimen: BLOOD  Result Value Ref Range Status   Specimen Description BLOOD BLOOD RIGHT HAND  Final   Special Requests   Final    BOTTLES DRAWN AEROBIC AND ANAEROBIC Blood Culture  results may not be optimal due to an inadequate volume of blood received in culture bottles   Culture   Final    NO GROWTH 2 DAYS  Performed at [facility], [address] [place], [place] [address]    Report Status PENDING  Incomplete  CULTURE, BLOOD (ROUTINE X 2) w Reflex to ID Panel     Status: None (Preliminary result)   Collection Time: 01/09/21  6:25 PM   Specimen: BLOOD  Result Value Ref Range Status   Specimen Description BLOOD BLOOD LEFT HAND  Final   Special Requests   Final    BOTTLES DRAWN AEROBIC AND ANAEROBIC Blood Culture adequate volume   Culture   Final    NO GROWTH 2 DAYS Performed at [facility], [address], [place], [place] [address]    Report Status PENDING  Incomplete         Radiology Studies: No results found.      Scheduled Meds:  acetylcysteine  2 mL Nebulization BID   amoxicillin-clavulanate  1 tablet Oral Q12H   enoxaparin (LOVENOX) injection  0.5 mg/kg Subcutaneous Q24H   furosemide  40 mg Oral BID   guaiFENesin  15 mL Oral QID   ipratropium-albuterol  3 mL Nebulization Q4H   oseltamivir  75 mg Oral BID   sertraline  50 mg Oral Daily   Continuous Infusions:     LOS: 4 days    Time spent: 20 min    Silvano Bilis, MD [facility]   If 7PM-7AM, please contact night-coverage [email or web address] Password [email or web address] 01/12/2021, 11:12 AM

## 2021-01-13 LAB — BASIC METABOLIC PANEL
Anion gap: 7 (ref 5–15)
BUN: 10 mg/dL (ref 6–20)
CO2: 32 mmol/L (ref 22–32)
Calcium: 8.1 mg/dL — ABNORMAL LOW (ref 8.9–10.3)
Chloride: 99 mmol/L (ref 98–111)
Creatinine, Ser: 0.64 mg/dL (ref 0.61–1.24)
GFR, Estimated: >60 mL/min (ref >60)
Glucose, Bld: 89 mg/dL (ref 70–99)
Potassium: 3.5 mmol/L (ref 3.5–5.1)
Sodium: 138 mmol/L (ref 135–145)

## 2021-01-13 LAB — CULTURE, BLOOD (ROUTINE X 2)
Culture: NO GROWTH
Special Requests: ADEQUATE

## 2021-01-13 LAB — MAGNESIUM: Magnesium: 2.1 mg/dL (ref 1.7–2.4)

## 2021-01-13 NOTE — TOC Initial Note (Signed)
Transition of Care [facility]) - Initial/Assessment Note    Patient Details  Name: Christian [name] MRN: [id number] Date of Birth: 24-Feb-1976  Transition of Care [facility]) CM/SW Contact:    Maree Krabbe, LCSW Phone Number: 01/13/2021, 8:40 AM  Clinical Narrative:   Pt is on droplet precautions. CSW attempted to reach pt in room as well as cell phone- unsuccessful.  Pt is from homeless shelter. Pt will be unable to get HH.              Expected Discharge Plan: Homeless Shelter Barriers to Discharge: Continued Medical Work up   Patient Goals and CMS Choice        Expected Discharge Plan and Services Expected Discharge Plan: Homeless Shelter In-house Referral: Clinical Social Work   Post Acute Care Choice: NA Living arrangements for the past 2 months: Homeless Shelter                                      Prior Living Arrangements/Services Living arrangements for the past 2 months: Homeless Shelter     Do you feel safe going back to the place where you live?: Yes               Activities of Daily Living Home Assistive Devices/Equipment: Eyeglasses ADL Screening (condition at time of admission) Patient's cognitive ability adequate to safely complete daily activities?: Yes Is the patient deaf or have difficulty hearing?: No Does the patient have difficulty seeing, even when wearing glasses/contacts?: No Does the patient have difficulty concentrating, remembering, or making decisions?: No Patient able to express need for assistance with ADLs?: Yes Does the patient have difficulty dressing or bathing?: No Independently performs ADLs?: Yes (appropriate for developmental age) Does the patient have difficulty walking or climbing stairs?: Yes Weakness of Legs: None Weakness of Arms/Hands: None  Permission Sought/Granted                  Emotional Assessment Appearance:: Appears stated age     Orientation: : Oriented to Self, Oriented to Place, Oriented to   Time, Oriented to Situation Alcohol / Substance Use: Not Applicable Psych Involvement: No (comment)  Admission diagnosis:  Cellulitis of buttock [L03.317] Influenza A [J10.1] Positive D dimer [R79.89] Acute respiratory failure with hypoxia (HCC) [J96.01] Acute respiratory failure with hypoxemia (HCC) [J96.01] Sepsis without acute organ dysfunction, due to unspecified organism [facility]) [A41.9] Patient Active Problem List   Diagnosis Date Noted   Shortness of breath 01/08/2021   Cellulitis and abscess of buttock 01/08/2021   Prediabetes 01/08/2021   Pleural effusion 11/25/2020   Chronic diastolic CHF (congestive heart failure) (HCC) 11/25/2020   Depression 11/25/2020   Obesity hypoventilation syndrome (HCC) 10/17/2020   Acute on chronic respiratory failure with hypoxia and hypercapnia (HCC)    Bowel perforation (HCC)    Hypotension    Acute respiratory failure with hypoxia (HCC)    Weakness    Cellulitis of abdominal wall 10/10/2020   Impaired fasting glucose    Sepsis (HCC) 10/09/2020   Hyperglycemia 10/09/2020   AKI (acute kidney injury) (HCC) 10/09/2020   Pressure ulcer of back 10/09/2020   Pressure ulcer of buttock 10/09/2020   Essential hypertension 10/09/2020   Chest pain 04/06/2011   Morbid obesity (HCC)    Elevated blood pressure reading without diagnosis of hypertension    PCP:  Pcp, No Pharmacy:   [facility]  [address] [place] [address] Phone: [phone] Fax: [phone]     Social Determinants of Health (SDOH) Interventions    Readmission Risk Interventions Readmission Risk Prevention Plan 10/11/2020  Post Dischage Appt Complete  Medication Screening Complete  Transportation Screening Complete  Some recent data might be hidden

## 2021-01-13 NOTE — TOC Progression Note (Signed)
Transition of Care [facility]) - Progression Note    Patient Details  Name: Christian [name] MRN: [id number] Date of Birth: Jun 21, 1975  Transition of Care [facility]) CM/SW Contact  Maree Krabbe, LCSW Phone Number: 01/13/2021, 3:18 PM  Clinical Narrative:   On last admission (11/24/20) CSW faxed the disability referral form to [facility]. CSW has now emailed Nelma Rothman the Interior and spatial designer of Disability services to see where they are in the status of the referral.     Expected Discharge Plan: Homeless Shelter Barriers to Discharge: Continued Medical Work up  Expected Discharge Plan and Services Expected Discharge Plan: Homeless Shelter In-house Referral: Clinical Social Work   Post Acute Care Choice: NA Living arrangements for the past 2 months: Homeless Shelter                                       Social Determinants of Health (SDOH) Interventions    Readmission Risk Interventions Readmission Risk Prevention Plan 10/11/2020  Post Dischage Appt Complete  Medication Screening Complete  Transportation Screening Complete  Some recent data might be hidden

## 2021-01-13 NOTE — Progress Notes (Addendum)
[facility] Progress Note  Patient: Christian [name]    KGY:[id number]  DOA: 01/08/2021    Date of Service: the patient was seen and examined on 01/13/2021  Brief hospital course: Patient with morbid obesity presented with numbness shortness of breath.  Found to have influenza A causing sepsis along with acute hypoxic respiratory failure.  Also being treated for OSA.  Assessment and Plan: Influenza A Sepsis, present on admission On admission, symptomatic with fever, hypoxia, tachypnea. CTA neg for PE. CTA does show multifocal pneumonia.  Procal 0.2, low concern for bacterial pneumonia. Sepsis physiology resolved continue tamiflu (11/11>)   Acute hypoxic respiratory failure, present on admission 2/2 flu, obesity hypoventilation syndrome.  Symptomatically improving. Stable o2, currently on 8L on exertion.  - wean as able - continue bipap at night    # Obesity hypoventilation syndrome - will plan on continuing qhs bipap, given patient's homeless situation would not be able to arrange BiPAP on discharge.   # Pressure ulcer # Cellulitis Stage 3 to right upper thigh. Surrounding erythema though no notable induration, no abscess seen on CT. Erythema somewhat improved w/ abx - wound care consult - have de-escalated abx to augmentin, will plan for total 7 days of abx (through 11/17)   # HFpEF Difficult to ascertain volume status given body habitus. Does have trace pleural effusions and LE edema with mildly elevated bnp - resumed home lasix 40 po bid   # Hypokalemia Likely 2/2 diuretics. Mg wnl. - continue kcl 40 qd   # Homelessness - TOC consult   # Debility - PT consulted, advising HH PT  Body mass index is 60.98 kg/m.    Subjective: Continues to have shortness of breath.  No nausea no vomiting.  Physical Exam: General: Appear in moderate distress, no Rash; Oral Mucosa Clear, moist. no Abnormal Neck Mass Or lumps, Conjunctiva normal  Cardiovascular: S1 and S2  Present, no Murmur, Respiratory: increased respiratory effort, Bilateral Air entry present and  Occasional  Crackles, bilateral expiratory  wheezes Abdomen: Bowel Sound present, Soft and no tenderness Extremities: no Pedal edema Neurology: alert and oriented to time, place, and person affect appropriate. no new focal deficit Gait not checked due to patient safety concerns  Data Reviewed: Electrolytes stable creatinine stable.  Disposition:  Status is: Inpatient  Remains inpatient appropriate because: Severely hypoxic, 8 L on exertion  Family Communication: no family was present at bedside, at the time of interview.   DVT Prophylaxis: SCDs Start: 01/08/21 2330 Lovenox per pharmacy.  Time spent: 35 minutes. I reviewed all nursing notes, pharmacy notes, vitals, pertinent old records. I have discussed plan of care as described above with RN.  Author: Lynden Oxford  01/13/2021 7:23 PM  To reach On-call, see care teams to locate the attending and reach out via [email or web address]. Between 7PM-7AM, please contact night-coverage If you still have difficulty reaching the attending provider, please page the [facility] (Director on Call) for [facility] on amion for assistance.

## 2021-01-13 NOTE — Plan of Care (Signed)
  Problem: Clinical Measurements: Goal: Will remain free from infection Outcome: Progressing   Problem: Activity: Goal: Risk for activity intolerance will decrease Outcome: Progressing   Problem: Nutrition: Goal: Adequate nutrition will be maintained Outcome: Progressing   Problem: Coping: Goal: Level of anxiety will decrease Outcome: Progressing   

## 2021-01-14 LAB — BASIC METABOLIC PANEL
Anion gap: 12 (ref 5–15)
BUN: 9 mg/dL (ref 6–20)
CO2: 31 mmol/L (ref 22–32)
Calcium: 8.3 mg/dL — ABNORMAL LOW (ref 8.9–10.3)
Chloride: 95 mmol/L — ABNORMAL LOW (ref 98–111)
Creatinine, Ser: 0.69 mg/dL (ref 0.61–1.24)
GFR, Estimated: >60 mL/min (ref >60)
Glucose, Bld: 88 mg/dL (ref 70–99)
Potassium: 3.7 mmol/L (ref 3.5–5.1)
Sodium: 138 mmol/L (ref 135–145)

## 2021-01-14 LAB — MAGNESIUM: Magnesium: 2.3 mg/dL (ref 1.7–2.4)

## 2021-01-14 LAB — CBC
HCT: 39 % (ref 39.0–52.0)
Hemoglobin: 11.8 g/dL — ABNORMAL LOW (ref 13.0–17.0)
MCH: 24.4 pg — ABNORMAL LOW (ref 26.0–34.0)
MCHC: 30.3 g/dL (ref 30.0–36.0)
MCV: 80.6 fL (ref 80.0–100.0)
Platelets: 280 10*3/uL (ref 150–400)
RBC: 4.84 MIL/uL (ref 4.22–5.81)
RDW: 15.2 % (ref 11.5–15.5)
WBC: 4.4 10*3/uL (ref 4.0–10.5)
nRBC: 0 % (ref 0.0–0.2)

## 2021-01-14 LAB — CULTURE, BLOOD (ROUTINE X 2)
Culture: NO GROWTH
Culture: NO GROWTH
Special Requests: ADEQUATE

## 2021-01-14 NOTE — Progress Notes (Signed)
[facility] Progress Note  Patient: Christian [name]    VOH:[id number]  DOA: 01/08/2021    Date of Service: the patient was seen and examined on 01/14/2021  Brief hospital course: Patient with morbid obesity presented with numbness shortness of breath.  Found to have influenza A causing sepsis along with acute hypoxic respiratory failure.  Also being treated for OSA.  Assessment and Plan: Influenza A Sepsis, present on admission On admission, symptomatic with fever, hypoxia, tachypnea. CTA neg for PE. CTA does show multifocal pneumonia.  Procal 0.2, low concern for bacterial pneumonia. Sepsis physiology resolved Completed tamiflu.  Also on Augmentin.  Will stop tomorrow.   Acute hypoxic respiratory failure, present on admission 2/2 flu, obesity hypoventilation syndrome.  Symptomatically improving. Stable o2, currently on 4L on exertion.  - wean to room air.  Patient is homeless and will not be able to receive oxygen or BiPAP outpatient. - continue bipap at night    # Obesity hypoventilation syndrome - will plan on continuing qhs bipap, given patient's homeless situation would not be able to arrange BiPAP on discharge.   # Pressure ulcer # Cellulitis Stage 3 to right upper thigh. Surrounding erythema though no notable induration, no abscess seen on CT. Erythema somewhat improved w/ abx - wound care consult - have de-escalated abx to augmentin, last 11/17.   # HFpEF Difficult to ascertain volume status given body habitus. Does have trace pleural effusions and LE edema with mildly elevated bnp - resumed home lasix 40 po bid   # Hypokalemia Likely 2/2 diuretics. Mg wnl. - continue kcl 40 qd   # Homelessness - TOC consult   # Debility - PT consulted, advising HH PT  Morbid obesity. Placing the patient at high risk for poor outcome.  Body mass index is 60.98 kg/m.    Subjective: Breathing improving.  No nausea no vomiting.  No chest pain.  Continues to have  cough.  Physical Exam: General: Appear in mild distress, no Rash; Oral Mucosa Clear, moist. no Abnormal Neck Mass Or lumps, Conjunctiva normal  Cardiovascular: S1 and S2 Present, no Murmur, Respiratory: increased respiratory effort, Bilateral Air entry present and bilateral  Crackles, no wheezes Abdomen: Bowel Sound present, Soft and no tenderness Extremities: trace Pedal edema Neurology: alert and oriented to time, place, and person affect appropriate. no new focal deficit Gait not checked due to patient safety concerns    Data Reviewed: Electrolytes stable creatinine stable.  CBC and BMP appears to be stable.  Disposition:  Status is: Inpatient  Remains inpatient appropriate because: Hypoxemia is improving although unable to discharge to short-term with oxygen.  Will attempt to wean.  Family Communication: no family was present at bedside, at the time of interview.   DVT Prophylaxis: SCDs Start: 01/08/21 2330 Lovenox per pharmacy.  Time spent: 35 minutes. I reviewed all nursing notes, pharmacy notes, vitals, pertinent old records. I have discussed plan of care as described above with RN.  Author: Lynden Oxford  01/14/2021 8:27 PM  To reach On-call, see care teams to locate the attending and reach out via [email or web address]. Between 7PM-7AM, please contact night-coverage If you still have difficulty reaching the attending provider, please page the [facility] (Director on Call) for [facility] on amion for assistance.

## 2021-01-14 NOTE — TOC Progression Note (Signed)
Transition of Care [facility]) - Progression Note    Patient Details  Name: Christian [name] MRN: [id number] Date of Birth: 04/02/1975  [facility]) CM/SW Contact  Chapman Fitch, RN Phone Number: 01/14/2021, 2:48 PM  Clinical Narrative:     Received response from [facility] with [facility] "I have spoken with [name], however, I'm short staffed at this time.  I will personally reach out to him tomorrow or Monday the latest. Thank you all for your patience, and understanding in advance."  Patient requiring 4L O2.  Confirmed with TOC supervisor that patient would be unable to return to homeless shelter on O2.  MD updated    Expected Discharge Plan: Homeless Shelter Barriers to Discharge: Continued Medical Work up  Expected Discharge Plan and Services Expected Discharge Plan: Homeless Shelter In-house Referral: Clinical Social Work   Post Acute Care Choice: NA Living arrangements for the past 2 months: Homeless Shelter                                       Social Determinants of Health (SDOH) Interventions    Readmission Risk Interventions Readmission Risk Prevention Plan 10/11/2020  Post Dischage Appt Complete  Medication Screening Complete  Transportation Screening Complete  Some recent data might be hidden

## 2021-01-14 NOTE — Progress Notes (Signed)
Physical Therapy Treatment Patient Details Name: Christian [name] MRN: [id number] DOB: 1975-03-25 Today's Date: 01/14/2021   History of Present Illness Pt is a 45 y/o M admitted on 01/08/21 with c/c of SOB. Pt noted to be positive for Influenza A. Pt noted to have cellulitic area to R buttock. Imaging reveals multifocal PNA in the R lung. PMH: morbid obesity, HTN    PT Comments    Pt was long sitting in bed upon arriving. On 5 L o2 but was able to participate in PT session on 4 L with only one short occasion of desaturation. Left on 4 L o2 post session with RN aware. Pt was able to exit L side of bed without physical assistance however HOB was elevated and heavy use of bed rail for assist. He stood and ambulated around room 5 x. ~ 120 ft total. He requested to have BM. Was able to have successful BM however when sitting on toilet, increased bleeding form wound site. RN made aware. Clean dressing applied to R posterior glut/sacral wound. At conclusion of session, pt was repositioned in bed. Order placed for bariatric recliner for future sessions. Pt will be in acute hospital until able to wean O2. He lives in homeless shelter and is unable to be on O2 there. Acute PT will continue to follow and progress as able per current POC.    Recommendations for follow up therapy are one component of a multi-disciplinary discharge planning process, led by the attending physician.  Recommendations may be updated based on patient status, additional functional criteria and insurance authorization.  Follow Up Recommendations  Other (comment) (Plan is to DC to homeless shelter once off O2. Unable to have post acute PT due to lack of payer source.)     Assistance Recommended at Discharge Intermittent Supervision/Assistance  Equipment Recommendations  Other (comment) (pt has a bariatric RW already (per pt))       Precautions / Restrictions Precautions Precautions:  (droplet)     Mobility  Bed  Mobility Overal bed mobility: Modified Independent      General bed mobility comments: HOB slightly elevated    Transfers Overall transfer level: Modified independent Equipment used: Rolling walker (2 wheels) Transfers: Sit to/from Stand Sit to Stand: Min guard           General transfer comment: CGA for safety with pt requiring CGA. needs vcs for increased fwd wt shift to achieve standing from lower toilet height (BSC) and lowest bed height    Ambulation/Gait Ambulation/Gait assistance: Supervision   Assistive device: Rolling walker (2 wheels) Gait Pattern/deviations: Step-through pattern;Decreased stride length;Trunk flexed Gait velocity: decreased     General Gait Details: pt ambulated ~ 5 laps around room. does c/o L knee pain however did not impact ambulation. sao2 desaturats to 84% on 4 L O2 however quickly recovers with breathing techniques       Balance Overall balance assessment: Needs assistance Sitting-balance support: Feet supported Sitting balance-[name]: Normal     Standing balance support: Bilateral upper extremity supported;During functional activity Standing balance-[name]: Fair Standing balance comment: Requiring B UE support on RW        Cognition Arousal/Alertness: Awake/alert Behavior During Therapy: WFL for tasks assessed/performed Overall Cognitive Status: Within Functional Limits for tasks assessed      General Comments: Pt is A and O x 4. does report he attempted to work after last admission but was unable to keep up with work load. He is working on disability.  Pertinent Vitals/Pain Faces Pain Scale: Hurts a little bit Pain Location: sore on foot and L knee pain in wt bearing Pain Descriptors / Indicators: Discomfort     PT Goals (current goals can now be found in the care plan section) Acute Rehab PT Goals Patient Stated Goal: get better    Frequency    Min 2X/week      PT Plan Current plan  remains appropriate       AM-PAC PT "6 Clicks" Mobility   Outcome Measure  Help needed turning from your back to your side while in a flat bed without using bedrails?: A Little Help needed moving from lying on your back to sitting on the side of a flat bed without using bedrails?: A Little Help needed moving to and from a bed to a chair (including a wheelchair)?: A Little Help needed standing up from a chair using your arms (e.g., wheelchair or bedside chair)?: A Little Help needed to walk in hospital room?: A Little Help needed climbing 3-5 steps with a railing? : A Lot 6 Click Score: 17    End of Session Equipment Utilized During Treatment: Oxygen Activity Tolerance: Patient tolerated treatment well Patient left: in bed;with call bell/phone within reach;with bed alarm set Nurse Communication: Mobility status PT Visit Diagnosis: Muscle weakness (generalized) (M62.81)     Time:  -     Charges:                        Jetta Lout PTA 01/14/21, 1:57 PM

## 2021-01-15 DIAGNOSIS — J101 Influenza due to other identified influenza virus with other respiratory manifestations: Secondary | ICD-10-CM

## 2021-01-15 DIAGNOSIS — J09X1 Influenza due to identified novel influenza A virus with pneumonia: Secondary | ICD-10-CM | POA: Diagnosis present

## 2021-01-15 DIAGNOSIS — E876 Hypokalemia: Secondary | ICD-10-CM | POA: Diagnosis present

## 2021-01-15 DIAGNOSIS — Z5900 Homelessness unspecified: Secondary | ICD-10-CM

## 2021-01-15 MED ORDER — IPRATROPIUM-ALBUTEROL 0.5-2.5 (3) MG/3ML IN SOLN
3.0000 mL | Freq: Four times a day (QID) | RESPIRATORY_TRACT | Status: DC
  Administered 2021-01-15 – 2021-01-20 (×22): 3 mL via RESPIRATORY_TRACT
  Filled 2021-01-15 (×22): qty 3

## 2021-01-15 MED ORDER — FUROSEMIDE 40 MG PO TABS
40.0000 mg | ORAL_TABLET | Freq: Three times a day (TID) | ORAL | Status: DC
  Administered 2021-01-15 – 2021-01-22 (×21): 40 mg via ORAL
  Filled 2021-01-15 (×21): qty 1

## 2021-01-15 NOTE — Assessment & Plan Note (Addendum)
Repleted and resolved 

## 2021-01-15 NOTE — Assessment & Plan Note (Addendum)
Placing the patient at high risk for poor outcome.  Counseled for weight loss

## 2021-01-15 NOTE — Assessment & Plan Note (Addendum)
Severely hypoxemic.  Requiring 8 L of oxygen on BiPAP therapy. Currently improving to 2 L of oxygen. Will need to be on room air before discharge given homeless situation.  Nursing has been requested to wean his oxygen to room air and document oxygen saturations

## 2021-01-15 NOTE — Assessment & Plan Note (Addendum)
TOC following.  Unable to get the patient to homeless shelter on oxygen or BiPAP.  Wean oxygen.

## 2021-01-15 NOTE — Assessment & Plan Note (Addendum)
Blood pressure stable ? ?

## 2021-01-15 NOTE — Assessment & Plan Note (Addendum)
Treated with antibiotics.Completed antibiotic with Augmentin.

## 2021-01-15 NOTE — Progress Notes (Signed)
Physical Therapy Treatment Patient Details Name: Christian [name] MRN: [id number] DOB: 1975/06/03 Today's Date: 01/15/2021   History of Present Illness Pt is a 45 y/o M admitted on 01/08/21 with c/c of SOB. Pt noted to be positive for Influenza A. Pt noted to have cellulitic area to R buttock. Imaging reveals multifocal PNA in the R lung. PMH: morbid obesity, HTN    PT Comments    Pt has much improved breathing overall today. Less SOB noted without desaturation. Was able to wean down O2 to 3 L during ambulation > distances. He reports he has a bariatric RW but homeless shelter was c/o scratching floor and requested he not use. It will need to be located prior to DC. If unable to be located will need new bariatric RW for safety with all standing activity.  PT will continue to progress pt to PLOF.    Recommendations for follow up therapy are one component of a multi-disciplinary discharge planning process, led by the attending physician.  Recommendations may be updated based on patient status, additional functional criteria and insurance authorization.  Follow Up Recommendations  Other (comment) (Pt is homeless and per SW/CM will be unable to have follow up therapy at DC)     Assistance Recommended at Discharge Intermittent Supervision/Assistance  Equipment Recommendations  Other (comment) (per pt has bariatric RW but needs to be found prior to DCing from acute hospital)       Precautions / Restrictions Precautions Precautions: None;Other (comment) (Droplet precautions) Precaution Comments: droplet Restrictions Weight Bearing Restrictions: No     Mobility  Bed Mobility Overal bed mobility: Modified Independent      General bed mobility comments: HOB slightly elevated    Transfers Overall transfer level: Modified independent Equipment used: Rolling walker (2 wheels) Transfers: Sit to/from Stand Sit to Stand: Min guard           General transfer comment: CGA for safety     Ambulation/Gait Ambulation/Gait assistance: Supervision Gait Distance (Feet): 80 Feet Assistive device: Rolling walker (2 wheels) Gait Pattern/deviations: Step-through pattern;Decreased stride length;Trunk flexed Gait velocity: decreased     General Gait Details: Pt was able to ambulate into hallway (droplet precautions maintained) with less O2 today. He was able to ambulate on 3 L > distances without desaturation       Balance Overall balance assessment: Needs assistance Sitting-balance support: Feet supported Sitting balance-[name]: Normal     Standing balance support: Bilateral upper extremity supported;During functional activity Standing balance-[name]: Fair       Cognition Arousal/Alertness: Awake/alert Behavior During Therapy: WFL for tasks assessed/performed Overall Cognitive Status: Within Functional Limits for tasks assessed      General Comments: Pt is A and O x 4.               Pertinent Vitals/Pain Pain Assessment: No/denies pain Pain Score: 0-No pain Pain Intervention(s): Limited activity within patient's tolerance;Monitored during session;Repositioned;Premedicated before session     PT Goals (current goals can now be found in the care plan section) Acute Rehab PT Goals Patient Stated Goal: get better Progress towards PT goals: Progressing toward goals    Frequency    Min 2X/week      PT Plan Current plan remains appropriate       AM-PAC PT "6 Clicks" Mobility   Outcome Measure  Help needed turning from your back to your side while in a flat bed without using bedrails?: A Little Help needed moving from lying on your back to  sitting on the side of a flat bed without using bedrails?: A Little Help needed moving to and from a bed to a chair (including a wheelchair)?: A Little Help needed standing up from a chair using your arms (e.g., wheelchair or bedside chair)?: A Little Help needed to walk in hospital room?: A Little Help  needed climbing 3-5 steps with a railing? : A Lot 6 Click Score: 17    End of Session Equipment Utilized During Treatment: Oxygen (wean to 3 L) Activity Tolerance: Patient tolerated treatment well Patient left: in bed;with call bell/phone within reach;with bed alarm set Nurse Communication: Mobility status PT Visit Diagnosis: Muscle weakness (generalized) (M62.81)     Time: 4142-3953 PT Time Calculation (min) (ACUTE ONLY): 29 min  Charges:  $Gait Training: 8-22 mins $Therapeutic Activity: 8-22 mins                    Jetta Lout PTA 01/15/21, 11:34 AM

## 2021-01-15 NOTE — Assessment & Plan Note (Addendum)
On Lasix 40 mg twice daily. Currently on Lasix 40 mg 3 times daily.  Monitor renal function. Net IO Since Admission: -7,743.27 mL [01/16/21 1227]

## 2021-01-15 NOTE — Progress Notes (Signed)
[facility] Progress Note  Patient: Christian [name]    UKG:[id number]  DOA: 01/08/2021    Date of Service: the patient was seen and examined on 01/15/2021  Brief hospital course: Patient with morbid obesity presented with numbness shortness of breath.  Found to have influenza A causing sepsis along with acute hypoxic respiratory failure. Also being treated for OSA. Unable to discharge on oxygen therefore we will continue to wean oxygen in the hospital.  Assessment and Plan:  * Influenza A with pneumonia Sepsis, present on admission On admission, symptomatic with fever, hypoxia, tachypnea. CTA neg for PE. CTA does show multifocal pneumonia.  Procal 0.2, low concern for bacterial pneumonia. Sepsis physiology resolved Completed tamiflu.   Obesity hypoventilation syndrome (HCC) On BiPAP.  BiPAP wont be able to continue on discharge.  Acute respiratory failure with hypoxia (HCC) Severely hypoxemic.  Requiring 8 L of oxygen on BiPAP therapy. Currently improving to 2 to 4 L of oxygen. Will need to be on room air before discharge given homeless situation.  Cellulitis and abscess of buttock Treated with antibiotics. Completed antibiotic with Augmentin.  Chronic diastolic CHF (congestive heart failure) (HCC) On Lasix 40 mg twice daily. Currently on Lasix 40 mg 3 times daily.  Monitor renal function.  Essential hypertension Blood pressure stable.  Monitor.  Obesity, Class III, BMI 40-49.9 (morbid obesity) (HCC) Placing the patient at high risk for poor outcome. Monitor.  Hypokalemia Potassium level improving.  Homelessness TOC consult. Unable to get the patient to homeless shelter on oxygen or BiPAP.  Wean at the hospital.  Pressure ulcer of buttock Treated with antibiotics.  Body mass index is 60.98 kg/m.    Subjective: Continues to have shortness of breath and cough although improving.  No nausea no vomiting.  Physical Exam: General: Appear in mild distress, no  Rash; Oral Mucosa Clear, moist. no Abnormal Neck Mass Or lumps, Conjunctiva normal  Cardiovascular: S1 and S2 Present, no Murmur, Respiratory: increased respiratory effort, Bilateral Air entry present and bilateral  Crackles, no wheezes Abdomen: Bowel Sound present, Soft and no tenderness Extremities: trace Pedal edema Neurology: alert and oriented to time, place, and person affect appropriate. no new focal deficit Gait not checked due to patient safety concerns   Data Reviewed: My review of labs, imaging, notes and other tests shows no new significant findings.   Disposition:  Status is: Inpatient  Remains inpatient appropriate because: Need oxygen still.  Continue to wean.   Family Communication: no family was present at bedside, at the time of interview.   DVT Prophylaxis: SCDs Start: 01/08/21 2330   Time spent: 35 minutes. I reviewed all nursing notes, pharmacy notes, vitals, pertinent old records. I have discussed plan of care as described above with RN.  Author: Lynden Oxford  01/15/2021 8:05 PM  To reach On-call, see care teams to locate the attending and reach out via [email or web address]. Between 7PM-7AM, please contact night-coverage If you still have difficulty reaching the attending provider, please page the [facility] (Director on Call) for [facility] on amion for assistance.

## 2021-01-15 NOTE — Assessment & Plan Note (Addendum)
Sepsis, present on admission On admission, symptomatic with fever, hypoxia, tachypnea. CTA neg for PE. CTA does show multifocal pneumonia.  Procal 0.2, low concern for bacterial pneumonia. Sepsis physiology resolved Completed course of tamiflu

## 2021-01-15 NOTE — Assessment & Plan Note (Addendum)
On BiPAP.  BiPAP wont be able to continue on discharge.  Homeless shelter will not accommodate BiPAP

## 2021-01-15 NOTE — Hospital Course (Addendum)
Patient with morbid obesity presented with numbness shortness of breath.  Found to have influenza A causing sepsis along with acute hypoxic respiratory failure. Also being treated for OSA. Unable to discharge on oxygen therefore we will continue to wean oxygen in the hospital.  11/19: Patient is still on 2 L oxygen during the day and BiPAP during the night.  Weaning efforts continues 11/20 -required BiPAP last night.  On room air this morning.  Waiting on homeless shelter to confirm if he can go there 11/22 back on oxygen.  Augmentin 11/14- 11-18 Culture: STAPHYLOCOCCUS HOMINIS 11/11 1 out 4.   Influenza A by PCR POSITIVE Abnormal

## 2021-01-15 NOTE — Assessment & Plan Note (Addendum)
Treated with antibiotics.  Continue wound care. 

## 2021-01-16 LAB — CBC
HCT: 40.2 % (ref 39.0–52.0)
Hemoglobin: 12.3 g/dL — ABNORMAL LOW (ref 13.0–17.0)
MCH: 24.8 pg — ABNORMAL LOW (ref 26.0–34.0)
MCHC: 30.6 g/dL (ref 30.0–36.0)
MCV: 81 fL (ref 80.0–100.0)
Platelets: 357 10*3/uL (ref 150–400)
RBC: 4.96 MIL/uL (ref 4.22–5.81)
RDW: 15.2 % (ref 11.5–15.5)
WBC: 5.9 10*3/uL (ref 4.0–10.5)
nRBC: 0 % (ref 0.0–0.2)

## 2021-01-16 LAB — BASIC METABOLIC PANEL
Anion gap: 7 (ref 5–15)
BUN: 11 mg/dL (ref 6–20)
CO2: 31 mmol/L (ref 22–32)
Calcium: 8.6 mg/dL — ABNORMAL LOW (ref 8.9–10.3)
Chloride: 98 mmol/L (ref 98–111)
Creatinine, Ser: 0.74 mg/dL (ref 0.61–1.24)
GFR, Estimated: >60 mL/min (ref >60)
Glucose, Bld: 95 mg/dL (ref 70–99)
Potassium: 3.8 mmol/L (ref 3.5–5.1)
Sodium: 136 mmol/L (ref 135–145)

## 2021-01-16 NOTE — Assessment & Plan Note (Signed)
A1c 6%.

## 2021-01-16 NOTE — Progress Notes (Signed)
  Progress Note    Christian [name]   IHK:[id number]  DOB: 11/18/1975  DOA: 01/08/2021     8 Date of Service: 01/16/2021   Clinical Course  Patient with morbid obesity presented with numbness shortness of breath.  Found to have influenza A causing sepsis along with acute hypoxic respiratory failure. Also being treated for OSA. Unable to discharge on oxygen therefore we will continue to wean oxygen in the hospital.  11/19: Patient is still on 2 L oxygen during the day and BiPAP during the night.  Weaning efforts continues   Assessment and Plan * Influenza A with pneumonia Sepsis, present on admission On admission, symptomatic with fever, hypoxia, tachypnea. CTA neg for PE. CTA does show multifocal pneumonia.  Procal 0.2, low concern for bacterial pneumonia. Sepsis physiology resolved Completed course of tamiflu  Hypokalemia Repleted and resolved  Homelessness TOC following.  Unable to get the patient to homeless shelter on oxygen or BiPAP.  Wean oxygen.  Prediabetes A1c 6  Cellulitis and abscess of buttock Treated with antibiotics.Completed antibiotic with Augmentin.  Chronic diastolic CHF (congestive heart failure) (HCC) On Lasix 40 mg twice daily. Currently on Lasix 40 mg 3 times daily.  Monitor renal function. Net IO Since Admission: -7,743.27 mL [01/16/21 1227]  Obesity hypoventilation syndrome (HCC) On BiPAP.  BiPAP wont be able to continue on discharge.  Homeless shelter will not accommodate BiPAP  Acute respiratory failure with hypoxia (HCC) Severely hypoxemic.  Requiring 8 L of oxygen on BiPAP therapy. Currently improving to 2 L of oxygen. Will need to be on room air before discharge given homeless situation.  Nursing has been requested to wean his oxygen to room air and document oxygen saturations  Essential hypertension Blood pressure stable.    Pressure ulcer of buttock Treated with antibiotics.  Continue wound care  Obesity, Class III, BMI 40-49.9  (morbid obesity) (HCC) Placing the patient at high risk for poor outcome.  Counseled for weight loss     Subjective:  Patient denies any new complaints.  Objective Vitals:   01/16/21 0403 01/16/21 0731 01/16/21 0737 01/16/21 1150  BP: 133/76 139/88  138/82  Pulse: 76 65  78  Resp: 20 18  18   Temp: 98.4 F (36.9 C) 98.1 F (36.7 C)  98.3 F (36.8 C)  TempSrc:      SpO2: 93% 92% 92% 91%  Weight:      Height:       (!) 209.7 kg  Vital signs were reviewed and unremarkable.   Exam Physical Exam   General:  Morbidly obese male in no distress, no Rash; Oral Mucosa Clear, moist. no Abnormal Neck Mass Or lumps, Conjunctiva normal  Cardiovascular: S1 and S2 Present, no Murmur, Respiratory:  Clear to auscultation bilaterally, no wheezing rales rhonchi crepitation.  Difficult exam due to body size  abdomen: Bowel Sound present, Soft and no tenderness Extremities: trace Pedal edema Neurology: alert and oriented to time, place, and person affect appropriate. no new focal deficit  Labs / Other Information My review of labs, imaging, notes and other tests shows no new significant findings.    Disposition Plan: Status is: Inpatient  Remains inpatient appropriate because: Needs to wean his oxygen off before homeless shelter will take him.  He is still on 2 L oxygen.        Time spent: 25 minutes [facility] 01/16/2021, 12:29 PM

## 2021-01-16 NOTE — Plan of Care (Signed)
  Problem: Nutrition: Goal: Adequate nutrition will be maintained Outcome: Progressing   Problem: Coping: Goal: Level of anxiety will decrease Outcome: Progressing   

## 2021-01-16 NOTE — TOC Progression Note (Signed)
Transition of Care [facility]) - Progression Note    Patient Details  Name: Christian [name] MRN: [id number] Date of Birth: 1975-06-15  Transition of Care [facility]) CM/SW Contact  Gildardo Griffes, [place] Phone Number: 01/16/2021, 9:04 AM  Clinical Narrative:     TOC continuing to follow for discharge planning. Patient currently on 2L of O2, will be unable to return to homeless shelter until weans off of O2.   Pending response from [facility] for disability update.   Expected Discharge Plan: Homeless Shelter Barriers to Discharge: Continued Medical Work up  Expected Discharge Plan and Services Expected Discharge Plan: Homeless Shelter In-house Referral: Clinical Social Work   Post Acute Care Choice: NA Living arrangements for the past 2 months: Homeless Shelter                                       Social Determinants of Health (SDOH) Interventions    Readmission Risk Interventions Readmission Risk Prevention Plan 10/11/2020  Post Dischage Appt Complete  Medication Screening Complete  Transportation Screening Complete  Some recent data might be hidden

## 2021-01-16 NOTE — TOC Progression Note (Signed)
Transition of Care [facility]) - Progression Note    Patient Details  Name: Christian [name] MRN: [id number] Date of Birth: [birth year]  Transition of Care [facility]) CM/SW Contact  Gildardo Griffes, [place] Phone Number: 01/16/2021, 1:56 PM  Clinical Narrative:     Per MD patient's O2 needs have been weaned and anticipate medically ready to dc back to homeless shelter tomorrow.   CSW has called [facility] at [phone] to inform, no answer, a voicemail was left for them.   Expected Discharge Plan: Homeless Shelter Barriers to Discharge: Continued Medical Work up  Expected Discharge Plan and Services Expected Discharge Plan: Homeless Shelter In-house Referral: Clinical Social Work   Post Acute Care Choice: NA Living arrangements for the past 2 months: Homeless Shelter                                       Social Determinants of Health (SDOH) Interventions    Readmission Risk Interventions Readmission Risk Prevention Plan 10/11/2020  Post Dischage Appt Complete  Medication Screening Complete  Transportation Screening Complete  Some recent data might be hidden

## 2021-01-16 NOTE — Plan of Care (Signed)
  Problem: Clinical Measurements: Goal: Will remain free from infection Outcome: Adequate for Discharge   Problem: Activity: Goal: Risk for activity intolerance will decrease Outcome: Progressing   Problem: Nutrition: Goal: Adequate nutrition will be maintained Outcome: Adequate for Discharge   Problem: Coping: Goal: Level of anxiety will decrease Outcome: Adequate for Discharge

## 2021-01-17 NOTE — Assessment & Plan Note (Signed)
Completed antibiotic with Augmentin

## 2021-01-17 NOTE — Plan of Care (Signed)
?  Problem: Clinical Measurements: ?Goal: Ability to avoid or minimize complications of infection will improve ?Outcome: Progressing ?  ?Problem: Skin Integrity: ?Goal: Skin integrity will improve ?Outcome: Progressing ?  ?

## 2021-01-17 NOTE — Assessment & Plan Note (Signed)
Placing the patient at high risk for poor outcome.  Counseled for weight loss.  This is his biggest issue leading to a lot of other related complications

## 2021-01-17 NOTE — Assessment & Plan Note (Signed)
On BiPAP.  BiPAP wont be able to continue on discharge.  Homeless shelter will not accommodate BiPAP.  We will try him without BiPAP tonight

## 2021-01-17 NOTE — Assessment & Plan Note (Signed)
A1c 6%.

## 2021-01-17 NOTE — Assessment & Plan Note (Signed)
Currently on Lasix 40 mg 3 times daily.  Monitor renal function Net IO Since Admission: -9,198.27 mL [01/17/21 1006]

## 2021-01-17 NOTE — Assessment & Plan Note (Signed)
TOC following.  Unable to get the patient to homeless shelter on oxygen or BiPAP.  He is off oxygen during the daytime but still required BiPAP last night.  We will try to wean him off BiPAP at night and see if he can maintain oxygenation.  This is likely due to his sleep apnea

## 2021-01-17 NOTE — Progress Notes (Signed)
Physical Therapy Treatment Patient Details Name: Christian [name] MRN: [id number] DOB: [birth year] Today's Date: 01/17/2021   History of Present Illness Pt is a 45 y/o M admitted on 01/08/21 with c/c of SOB. Pt noted to be positive for Influenza A. Pt noted to have cellulitic area to R buttock. Imaging reveals multifocal PNA in the R lung. PMH: morbid obesity, HTN    PT Comments    Patient cooperative with PT treatment and is making progress towards meeting PT goals. Patient was on room air today. He ambulated with rolling walker with occasional cues for safety. Sp02 85% initially after walking, patient mildly short of breath with activity. Sp02 increased quickly to 90% with seated rest break and cues for breathing techniques. Recommend to continue PT to maximize independence and facilitate return to prior level of function.     Recommendations for follow up therapy are one component of a multi-disciplinary discharge planning process, led by the attending physician.  Recommendations may be updated based on patient status, additional functional criteria and insurance authorization.  Follow Up Recommendations  Home health PT     Assistance Recommended at Discharge Intermittent Supervision/Assistance  Equipment Recommendations  Rolling walker (2 wheels) (bariatric rolling walker)    Recommendations for Other Services       Precautions / Restrictions Precautions Precautions: Fall (droplet) Restrictions Weight Bearing Restrictions: No     Mobility  Bed Mobility Overal bed mobility: Modified Independent             General bed mobility comments: HOB slightly elevated. increased effort required    Transfers Overall transfer level: Modified independent Equipment used: Rolling walker (2 wheels) (bariatric rolling walker) Transfers: Sit to/from Stand Sit to Stand: Supervision           General transfer comment: no physical assistance required for standing. verbal cues for  safety. patient reports mild leg weakness initially with standing, however has no knee buckling or loss of balance with UE supported on rolling walker.    Ambulation/Gait Ambulation/Gait assistance: Supervision Gait Distance (Feet): 80 Feet Assistive device: Rolling walker (2 wheels) Gait Pattern/deviations: Step-through pattern Gait velocity: decreased     General Gait Details: no loss of balance with use of rolling walker. mild shortness of breath noted. patient ambulating on room air with Sp02 85% after walking, quickly increasing to 90% with seated rest break and cues for breathing techniques.   Stairs             Wheelchair Mobility    Modified Rankin (Stroke Patients Only)       Balance                                            Cognition Arousal/Alertness: Awake/alert Behavior During Therapy: WFL for tasks assessed/performed Overall Cognitive Status: Within Functional Limits for tasks assessed                                 General Comments: patient able to follow all commands without difficulty        Exercises      General Comments        Pertinent Vitals/Pain Pain Assessment: No/denies pain    Home Living  Prior Function            PT Goals (current goals can now be found in the care plan section) Acute Rehab PT Goals Patient Stated Goal: get better PT Goal Formulation: With patient Time For Goal Achievement: 01/24/21 Potential to Achieve Goals: Good Progress towards PT goals: Progressing toward goals    Frequency    Min 2X/week      PT Plan Current plan remains appropriate    Co-evaluation              AM-PAC PT "6 Clicks" Mobility   Outcome Measure  Help needed turning from your back to your side while in a flat bed without using bedrails?: A Little Help needed moving from lying on your back to sitting on the side of a flat bed without using  bedrails?: A Little Help needed moving to and from a bed to a chair (including a wheelchair)?: A Little Help needed standing up from a chair using your arms (e.g., wheelchair or bedside chair)?: A Little Help needed to walk in hospital room?: A Little Help needed climbing 3-5 steps with a railing? : A Lot 6 Click Score: 17    End of Session   Activity Tolerance: Patient tolerated treatment well Patient left: in bed;with call bell/phone within reach Nurse Communication:  (discussed with nurse aide) PT Visit Diagnosis: Muscle weakness (generalized) (M62.81)     Time: 3244-0102 PT Time Calculation (min) (ACUTE ONLY): 24 min  Charges:  $Therapeutic Activity: 23-37 mins                     Donna Bernard, PT, MPT    Ina Homes 01/17/2021, 2:18 PM

## 2021-01-17 NOTE — Assessment & Plan Note (Signed)
Treated with antibiotics.  Continue wound care.

## 2021-01-17 NOTE — Assessment & Plan Note (Signed)
Blood pressure stable ? ?

## 2021-01-17 NOTE — Plan of Care (Signed)
  Problem: Clinical Measurements: Goal: Will remain free from infection Outcome: Progressing   Problem: Activity: Goal: Risk for activity intolerance will decrease Outcome: Progressing   Problem: Nutrition: Goal: Adequate nutrition will be maintained Outcome: Progressing   Problem: Coping: Goal: Level of anxiety will decrease Outcome: Progressing   

## 2021-01-17 NOTE — Assessment & Plan Note (Signed)
Repleted and resolved 

## 2021-01-17 NOTE — Plan of Care (Signed)
  Problem: Clinical Measurements: Goal: Will remain free from infection Outcome: Progressing   Problem: Activity: Goal: Risk for activity intolerance will decrease 01/17/2021 1416 by Johnell Comings, RN Outcome: Progressing 01/17/2021 1342 by Johnell Comings, RN Outcome: Progressing   Problem: Nutrition: Goal: Adequate nutrition will be maintained 01/17/2021 1416 by Johnell Comings, RN Outcome: Progressing 01/17/2021 1342 by Johnell Comings, RN Outcome: Progressing   Problem: Coping: Goal: Level of anxiety will decrease 01/17/2021 1416 by Johnell Comings, RN Outcome: Progressing 01/17/2021 1342 by Johnell Comings, RN Outcome: Progressing

## 2021-01-17 NOTE — Progress Notes (Signed)
Progress Note    Christian [name]   DDU:[id number]  DOB: 21-Mar-1975  DOA: 01/08/2021     9 Date of Service: 01/17/2021   Clinical Course  Patient with morbid obesity presented with numbness shortness of breath.  Found to have influenza A causing sepsis along with acute hypoxic respiratory failure. Also being treated for OSA. Unable to discharge on oxygen therefore we will continue to wean oxygen in the hospital.  11/19: Patient is still on 2 L oxygen during the day and BiPAP during the night.  Weaning efforts continues 11/20 -required BiPAP last night.  On room air this morning.  Waiting on homeless shelter to confirm if he can go there   Assessment and Plan * Influenza A with pneumonia Sepsis, present on admission. On admission, symptomatic with fever, hypoxia, tachypnea. CTA neg for PE. CTA does show multifocal pneumonia.  Procal 0.2, low concern for bacterial pneumonia. Sepsis physiology resolved Completed course of tamiflu  Hypokalemia Repleted and resolved.  Homelessness TOC following.  Unable to get the patient to homeless shelter on oxygen or BiPAP.  He is off oxygen during the daytime but still required BiPAP last night.  We will try to wean him off BiPAP at night and see if he can maintain oxygenation.  This is likely due to his sleep apnea  Prediabetes A1c 6.  Cellulitis and abscess of buttock Completed antibiotic with Augmentin  Chronic diastolic CHF (congestive heart failure) (HCC) Currently on Lasix 40 mg 3 times daily.  Monitor renal function Net IO Since Admission: -9,198.27 mL [01/17/21 1006]  Obesity hypoventilation syndrome (HCC) On BiPAP.  BiPAP wont be able to continue on discharge.  Homeless shelter will not accommodate BiPAP.  We will try him without BiPAP tonight  Acute respiratory failure with hypoxia (HCC) Severely hypoxemic.  Requiring 8 L of oxygen on BiPAP therapy. He was on 2 L oxygen during the daytime yesterday but is now weaned off and  is on room air   Essential hypertension Blood pressure stable   Pressure ulcer of buttock Treated with antibiotics.  Continue wound care.  Obesity, Class III, BMI 40-49.9 (morbid obesity) (HCC) Placing the patient at high risk for poor outcome.  Counseled for weight loss.  This is his biggest issue leading to a lot of other related complications     Subjective:  No new complaints.  He is requiring BiPAP overnight  Objective Vitals:   01/17/21 0114 01/17/21 0525 01/17/21 0743 01/17/21 0814  BP: 124/74 128/83  127/85  Pulse: 76 74  77  Resp: 18 16  18   Temp: (!) 97.5 F (36.4 C) 98 F (36.7 C)  98 F (36.7 C)  TempSrc: Oral     SpO2: 96% 98% 94% 92%  Weight:      Height:       (!) 209.7 kg  Vital signs were reviewed and unremarkable.   Exam Physical Exam   General: Morbidly obese male in no distress, no Rash; Oral Mucosa Clear, moist. no Abnormal Neck Mass Or lumps, Conjunctiva normal  Cardiovascular:S1 and S2 Present, no Murmur, Respiratory: Clear to auscultation bilaterally, no wheezing rales rhonchi crepitation.  Difficult exam due to body size  abdomen:Bowel Sound present, Soft and no tenderness Extremities: trace Pedal edema Neurology:alert and oriented to time, place, and person affect appropriate. no new focal deficit  Labs / Other Information There are no new results to review at this time.   Disposition Plan: Status is: Inpatient  Remains inpatient appropriate because: Waiting  for him to be off BiPAP now so that homeless shelter can take him        Time spent: 25 minutes [facility] 01/17/2021, 10:07 AM

## 2021-01-17 NOTE — Assessment & Plan Note (Signed)
Severely hypoxemic.  Requiring 8 L of oxygen on BiPAP therapy. He was on 2 L oxygen during the daytime yesterday but is now weaned off and is on room air

## 2021-01-17 NOTE — Assessment & Plan Note (Signed)
Sepsis, present on admission On admission, symptomatic with fever, hypoxia, tachypnea. CTA neg for PE. CTA does show multifocal pneumonia.  Procal 0.2, low concern for bacterial pneumonia. Sepsis physiology resolved Completed course of tamiflu 

## 2021-01-18 ENCOUNTER — Other Ambulatory Visit: Payer: Self-pay

## 2021-01-18 LAB — BASIC METABOLIC PANEL
Anion gap: 7 (ref 5–15)
BUN: 14 mg/dL (ref 6–20)
CO2: 30 mmol/L (ref 22–32)
Calcium: 8.5 mg/dL — ABNORMAL LOW (ref 8.9–10.3)
Chloride: 99 mmol/L (ref 98–111)
Creatinine, Ser: 0.83 mg/dL (ref 0.61–1.24)
GFR, Estimated: >60 mL/min (ref >60)
Glucose, Bld: 107 mg/dL — ABNORMAL HIGH (ref 70–99)
Potassium: 3.5 mmol/L (ref 3.5–5.1)
Sodium: 136 mmol/L (ref 135–145)

## 2021-01-18 LAB — CBC
HCT: 40.6 % (ref 39.0–52.0)
Hemoglobin: 12.4 g/dL — ABNORMAL LOW (ref 13.0–17.0)
MCH: 24.3 pg — ABNORMAL LOW (ref 26.0–34.0)
MCHC: 30.5 g/dL (ref 30.0–36.0)
MCV: 79.6 fL — ABNORMAL LOW (ref 80.0–100.0)
Platelets: 378 10*3/uL (ref 150–400)
RBC: 5.1 MIL/uL (ref 4.22–5.81)
RDW: 15.5 % (ref 11.5–15.5)
WBC: 7.3 10*3/uL (ref 4.0–10.5)
nRBC: 0 % (ref 0.0–0.2)

## 2021-01-18 MED ORDER — POTASSIUM CHLORIDE CRYS ER 20 MEQ PO TBCR
20.0000 meq | EXTENDED_RELEASE_TABLET | Freq: Every day | ORAL | 11 refills | Status: DC
  Filled 2021-01-18: qty 60, 60d supply, fill #0

## 2021-01-18 MED ORDER — "ALGICELL CALCIUM DRESSING 4""X4 EX MISC"
1.0000 | Freq: Every day | CUTANEOUS | 0 refills | Status: DC
  Filled 2021-01-18 – 2021-03-11 (×3): qty 10, fill #0

## 2021-01-18 MED ORDER — FUROSEMIDE 40 MG PO TABS
40.0000 mg | ORAL_TABLET | Freq: Three times a day (TID) | ORAL | 0 refills | Status: DC
  Filled 2021-01-18: qty 60, 20d supply, fill #0

## 2021-01-18 MED ORDER — BENZONATATE 100 MG PO CAPS
100.0000 mg | ORAL_CAPSULE | Freq: Three times a day (TID) | ORAL | 0 refills | Status: DC | PRN
  Filled 2021-01-18: qty 30, 10d supply, fill #0

## 2021-01-18 MED ORDER — ONDANSETRON HCL 4 MG PO TABS: 8.0000 mg | ORAL_TABLET | Freq: Once | ORAL | Status: DC

## 2021-01-18 MED ORDER — DOCUSATE SODIUM 100 MG PO CAPS
100.0000 mg | ORAL_CAPSULE | Freq: Two times a day (BID) | ORAL | Status: DC
  Administered 2021-01-18 – 2021-02-12 (×51): 100 mg via ORAL
  Filled 2021-01-18 (×51): qty 1

## 2021-01-18 MED ORDER — GUAIFENESIN 100 MG/5ML PO LIQD
15.0000 mL | Freq: Four times a day (QID) | ORAL | 0 refills | Status: DC
  Filled 2021-01-18: qty 120, 2d supply, fill #0

## 2021-01-18 MED ORDER — DOCUSATE SODIUM 100 MG PO CAPS
100.0000 mg | ORAL_CAPSULE | Freq: Two times a day (BID) | ORAL | 0 refills | Status: DC
  Filled 2021-01-18: qty 10, 5d supply, fill #0

## 2021-01-18 MED ORDER — ONDANSETRON HCL 4 MG PO TABS: 4.0000 mg | ORAL_TABLET | Freq: Three times a day (TID) | ORAL | Status: DC | PRN

## 2021-01-18 MED ORDER — ONDANSETRON HCL 4 MG PO TABS
4.0000 mg | ORAL_TABLET | Freq: Three times a day (TID) | ORAL | 0 refills | Status: DC | PRN
  Filled 2021-01-18: qty 20, 7d supply, fill #0

## 2021-01-18 NOTE — Assessment & Plan Note (Signed)
Sepsis, present on admission. On admission, symptomatic with fever, hypoxia, tachypnea. CTA neg for PE. CTA does show multifocal pneumonia.  Procal 0.2, low concern for bacterial pneumonia. Sepsis physiology resolved Completed course of tamiflu. Inform patient that he will continue to have symptoms for next 2 to 3 weeks given severity of his flu.

## 2021-01-18 NOTE — Assessment & Plan Note (Addendum)
Pt was severely hypoxemic.   Requiring 8 L of oxygen and BiPAP therapy. Overnight without BiPAP oxygenation remained notable 88 to 89%. Back on 2LPM. Incentive spirometry and flutter valve ordered.  Flonase ordered.

## 2021-01-18 NOTE — Assessment & Plan Note (Signed)
Blood pressure stable ? ?

## 2021-01-18 NOTE — Assessment & Plan Note (Signed)
Currently on Lasix 40 mg 3 times daily.  Monitor renal function Net IO Since Admission: -10,808.27 mL [01/18/21 1912]

## 2021-01-18 NOTE — Assessment & Plan Note (Signed)
Stage III.  Continue current daily dressing. Treated with antibiotics.  Continue wound care.

## 2021-01-18 NOTE — Assessment & Plan Note (Signed)
Placing the patient at high risk for poor outcome.  Counseled for weight loss.  This is his biggest issue leading to a lot of other related complications. Body mass index is 60.98 kg/m.  

## 2021-01-18 NOTE — Assessment & Plan Note (Signed)
Completed antibiotic with Augmentin

## 2021-01-18 NOTE — Assessment & Plan Note (Signed)
A1c 6%.

## 2021-01-18 NOTE — Assessment & Plan Note (Addendum)
TOC following. Patient's current homeless shelter has refused the patient. TOC currently working for disposition. Currently back on oxygen  Currently remaining off of BiPAP.

## 2021-01-18 NOTE — TOC Progression Note (Signed)
Transition of Care [facility]) - Progression Note    Patient Details  Name: Christian [name] MRN: [id number] Date of Birth: Aug 07, 1975  [facility]) CM/SW Contact  Maree Krabbe, LCSW Phone Number: 01/18/2021, 2:55 PM  Clinical Narrative:  CSW confirmed with [facility] that they will not allow pt back due to too many medical concerns. CSW spoke with pt's friend Smitty Cords and he is going to go pick up pt's belongings from the shelter and will bring them to CSW tomorrow. Pt will be placed on difficult to place list by [facility] director. CSW has emailed [employer] to see where they are in the disability process and if pt would qualify.    Expected Discharge Plan: Homeless Shelter Barriers to Discharge: Continued Medical Work up  Expected Discharge Plan and Services Expected Discharge Plan: Homeless Shelter In-house Referral: Clinical Social Work   Post Acute Care Choice: NA Living arrangements for the past 2 months: Homeless Shelter Expected Discharge Date: 01/18/21                                     Social Determinants of Health (SDOH) Interventions    Readmission Risk Interventions Readmission Risk Prevention Plan 10/11/2020  Post Dischage Appt Complete  Medication Screening Complete  Transportation Screening Complete  Some recent data might be hidden

## 2021-01-18 NOTE — Assessment & Plan Note (Addendum)
Was on BiPAP. Patient will not continue BiPAP at homeless shelter. Patient was able to be weaned off of BiPAP.

## 2021-01-18 NOTE — Progress Notes (Signed)
[facility] Progress Note  Patient: Christian [name]    JGG:[id number]  DOA: 01/08/2021    Date of Service: the patient was seen and examined on 01/18/2021  Brief hospital course: Patient with morbid obesity presented with numbness shortness of breath.  Found to have influenza A causing sepsis along with acute hypoxic respiratory failure. Also being treated for OSA. Unable to discharge on oxygen therefore we will continue to wean oxygen in the hospital.  11/19: Patient is still on 2 L oxygen during the day and BiPAP during the night.  Weaning efforts continues 11/20 -required BiPAP last night.  On room air this morning.  Waiting on homeless shelter to confirm if he can go there  Assessment and Plan: * Influenza A with pneumonia Sepsis, present on admission. On admission, symptomatic with fever, hypoxia, tachypnea. CTA neg for PE. CTA does show multifocal pneumonia.  Procal 0.2, low concern for bacterial pneumonia. Sepsis physiology resolved Completed course of tamiflu. Inform patient that he will continue to have symptoms for next 2 to 3 weeks given severity of his flu.  Obesity hypoventilation syndrome (HCC) Was on BiPAP. You will be able to continue BiPAP at homeless shelter. Patient was able to be weaned off of BiPAP.  Acute respiratory failure with hypoxia (HCC) Pt was severely hypoxemic.   Requiring 8 L of oxygen and BiPAP therapy. Now he is weaned off to room air on rest. Overnight without BiPAP oxygenation remained notable 88 to 89%.  Cellulitis and abscess of buttock Completed antibiotic with Augmentin  Chronic diastolic CHF (congestive heart failure) (HCC) Currently on Lasix 40 mg 3 times daily.  Monitor renal function Net IO Since Admission: -10,808.27 mL [01/18/21 1912]  Essential hypertension Blood pressure stable   Prediabetes A1c 6.  Obesity, Class III, BMI 40-49.9 (morbid obesity) (HCC) Placing the patient at high risk for poor outcome.  Counseled  for weight loss.  This is his biggest issue leading to a lot of other related complications Body mass index is 60.98 kg/m.   Hypokalemia Repleted and resolved.  Homelessness TOC following. Patient's current homeless shelter has refused the patient. TOC currently working for disposition. Currently off of oxygen as well as BiPAP.  Pressure ulcer of buttock Stage III.  Continue current daily dressing. Treated with antibiotics.  Continue wound care.    Body mass index is 60.98 kg/m.    Pressure Injury 10/09/20 Buttocks Right Unstageable - Full thickness tissue loss in which the base of the injury is covered by slough (yellow, tan, gray, green or brown) and/or eschar (tan, brown or black) in the wound bed. (Active)  10/09/20 1933  Location: Buttocks  Location Orientation: Right  Staging: Unstageable - Full thickness tissue loss in which the base of the injury is covered by slough (yellow, tan, gray, green or brown) and/or eschar (tan, brown or black) in the wound bed.  Wound Description (Comments):   Present on Admission: Yes     Pressure Injury 10/22/20 Thigh Posterior;Proximal;Right Stage 3 -  Full thickness tissue loss. Subcutaneous fat may be visible but bone, tendon or muscle are NOT exposed. (Active)  10/22/20 1055  Location: Thigh  Location Orientation: Posterior;Proximal;Right  Staging: Stage 3 -  Full thickness tissue loss. Subcutaneous fat may be visible but bone, tendon or muscle are NOT exposed.  Wound Description (Comments):   Present on Admission: Yes     Subjective: Feeling better but still has shortness of breath still has cough.  Had some nausea no vomiting no fever  no chills.  Objective: Vital signs were reviewed and unremarkable. General: Appear in mild distress, no Rash; Oral Mucosa Clear, moist. no Abnormal Neck Mass Or lumps, Conjunctiva normal  Cardiovascular: S1 and S2 Present, no Murmur, Respiratory: Normal respiratory effort, Bilateral Air entry  present and no Crackles, Occasional wheezes Abdomen: Bowel Sound present, Soft and no tenderness Extremities: trace Pedal edema Neurology: alert and oriented to time, place, and person affect appropriate. no new focal deficit Gait not checked due to patient safety concerns   Data Reviewed: My review of labs, imaging, notes and other tests shows no new significant findings.   Disposition:  Status is: Inpatient  Remains inpatient appropriate because: Unsafe discharge.  Homeless patient.   Family Communication: No family at bedside.  DVT Prophylaxis: SCDs Start: 01/08/21 2330   Time spent: 35 minutes.   Author: Berle Mull  01/18/2021 7:14 PM  To reach On-call, see care teams to locate the attending and reach out via [email or web address]. Between 7PM-7AM, please contact night-coverage If you still have difficulty reaching the attending provider, please page the [facility] (Director on Call) for [facility] on amion for assistance.

## 2021-01-18 NOTE — Assessment & Plan Note (Signed)
Repleted and resolved 

## 2021-01-18 NOTE — Plan of Care (Signed)
  Problem: Clinical Measurements: Goal: Will remain free from infection Outcome: Adequate for Discharge   Problem: Activity: Goal: Risk for activity intolerance will decrease Outcome: Adequate for Discharge   Problem: Nutrition: Goal: Adequate nutrition will be maintained Outcome: Adequate for Discharge   Problem: Coping: Goal: Level of anxiety will decrease Outcome: Adequate for Discharge   Problem: Acute Rehab PT Goals(only PT should resolve) Goal: Pt Will Ambulate Outcome: Adequate for Discharge Goal: Pt Will Go Up/Down Stairs Outcome: Adequate for Discharge   Problem: Clinical Measurements: Goal: Ability to avoid or minimize complications of infection will improve Outcome: Adequate for Discharge   Problem: Skin Integrity: Goal: Skin integrity will improve Outcome: Adequate for Discharge

## 2021-01-18 NOTE — Progress Notes (Signed)
Physical Therapy Treatment Patient Details Name: Coltan Spinello MRN: [id number] DOB: 1975/04/14 Today's Date: 01/18/2021   History of Present Illness Pt is a 45 y/o M admitted on 01/08/21 with c/c of SOB. Pt noted to be positive for Influenza A. Pt noted to have cellulitic area to R buttock. Imaging reveals multifocal PNA in the R lung. PMH: morbid obesity, HTN    PT Comments    Patient alert, agreeable to PT, eager to ambulate and see how he felt today. Pt performed all bed mobility with supervision. Sit <> stand with CGA, and was able to ambulate ~118ft with RW and CGA/supervision. Mild SOB noted, pt on room air throughout. Did desaturate to 86%, but recovered 90% with a standing rest break prior to returning to sitting. Pt endorsed he does feel better than when he came in, but still a bit of SOB. The patient would benefit from further skilled PT intervention to continue to progress towards goals. Recommendation updated to outpatient due to pt inability to obtain home health PT. PT to speak with CSW about resources after discussion with patient as well.     Recommendations for follow up therapy are one component of a multi-disciplinary discharge planning process, led by the attending physician.  Recommendations may be updated based on patient status, additional functional criteria and insurance authorization.  Follow Up Recommendations  Outpatient PT     Assistance Recommended at Discharge Intermittent Supervision/Assistance  Equipment Recommendations  Rolling walker (2 wheels) (bariatric rolling walker)    Recommendations for Other Services       Precautions / Restrictions Precautions Precautions: Fall Precaution Comments: droplet Restrictions Weight Bearing Restrictions: No     Mobility  Bed Mobility Overal bed mobility: Modified Independent             General bed mobility comments: HOB slightly elevated. increased effort required    Transfers Overall transfer  level: Modified independent Equipment used: Rolling walker (2 wheels) (bariatric) Transfers: Sit to/from Stand Sit to Stand: Supervision           General transfer comment: no physical assistance required for standing. verbal cues for safety. patient reports mild leg weakness initially with standing, however has no knee buckling or loss of balance with UE supported on rolling walker.    Ambulation/Gait Ambulation/Gait assistance: Supervision Gait Distance (Feet): 110 Feet Assistive device: Rolling walker (2 wheels) Gait Pattern/deviations: Step-through pattern Gait velocity: decreased     General Gait Details: no loss of balance with use of rolling walker. mild shortness of breath noted. patient ambulating on room air with Sp02 85% after walking, quickly increasing to 90% with standing rest break and cues for breathing techniques.   Stairs             Wheelchair Mobility    Modified Rankin (Stroke Patients Only)       Balance Overall balance assessment: Needs assistance Sitting-balance support: Feet supported Sitting balance-[name]: Normal     Standing balance support: Bilateral upper extremity supported;During functional activity Standing balance-[name]: Fair Standing balance comment: Requiring B UE support on RW                            Cognition Arousal/Alertness: Awake/alert Behavior During Therapy: WFL for tasks assessed/performed Overall Cognitive Status: Within Functional Limits for tasks assessed  General Comments: patient able to follow all commands without difficulty        Exercises      General Comments General comments (skin integrity, edema, etc.): on room air throughout      Pertinent Vitals/Pain Pain Assessment: No/denies pain    Home Living                          Prior Function            PT Goals (current goals can now be found in the care plan  section) Progress towards PT goals: Progressing toward goals    Frequency    Min 2X/week      PT Plan Discharge plan needs to be updated;Current plan remains appropriate    Co-evaluation              AM-PAC PT "6 Clicks" Mobility   Outcome Measure  Help needed turning from your back to your side while in a flat bed without using bedrails?: A Little Help needed moving from lying on your back to sitting on the side of a flat bed without using bedrails?: A Little Help needed moving to and from a bed to a chair (including a wheelchair)?: A Little Help needed standing up from a chair using your arms (e.g., wheelchair or bedside chair)?: A Little Help needed to walk in hospital room?: A Little Help needed climbing 3-5 steps with a railing? : A Lot 6 Click Score: 17    End of Session   Activity Tolerance: Patient tolerated treatment well Patient left: in bed;with call bell/phone within reach Nurse Communication: Mobility status (discussed with nurse aide) PT Visit Diagnosis: Muscle weakness (generalized) (M62.81)     Time: 0867-6195 PT Time Calculation (min) (ACUTE ONLY): 26 min  Charges:  $Therapeutic Exercise: 8-22 mins $Therapeutic Activity: 8-22 mins                    Olga Coaster PT, DPT 12:20 PM,01/18/21

## 2021-01-19 ENCOUNTER — Other Ambulatory Visit: Payer: Self-pay

## 2021-01-19 DIAGNOSIS — J09X1 Influenza due to identified novel influenza A virus with pneumonia: Secondary | ICD-10-CM

## 2021-01-19 NOTE — Progress Notes (Signed)
Physical Therapy Treatment Patient Details Name: Christian [name] MRN: [id number] DOB: 1975/03/18 Today's Date: 01/19/2021   History of Present Illness Pt is a 45 y/o M admitted on 01/08/21 with c/c of SOB. Pt noted to be positive for Influenza A. Pt noted to have cellulitic area to R buttock. Imaging reveals multifocal PNA in the R lung. PMH: morbid obesity, HTN    PT Comments    Patient alert, agreeable to PT. Able to perform all bed mobility modI. Sit <> stand from EOB (elevated) as well as from [facility] over the commode, supervision. He ambulated in bouts today due to desaturation (86-90%), denied SOB or light headedness throughout, ~17ft total with RW and supervision. Pt also able to perform pericare, but ultimately did need assistance to ensure cleanliness. The patient would benefit from further skilled PT intervention to continue to progress towards goals. Recommendation remains appropriate.       Recommendations for follow up therapy are one component of a multi-disciplinary discharge planning process, led by the attending physician.  Recommendations may be updated based on patient status, additional functional criteria and insurance authorization.  Follow Up Recommendations  Outpatient PT     Assistance Recommended at Discharge Intermittent Supervision/Assistance  Equipment Recommendations  Rolling walker (2 wheels) (bariatric rolling walker)    Recommendations for Other Services OT consult     Precautions / Restrictions Precautions Precautions: Fall Precaution Comments: droplet Restrictions Weight Bearing Restrictions: No     Mobility  Bed Mobility Overal bed mobility: Modified Independent             General bed mobility comments: HOB slightly elevated. increased effort required    Transfers Overall transfer level: Modified independent Equipment used: Rolling walker (2 wheels) (bariatric) Transfers: Sit to/from Stand Sit to Stand: Supervision            General transfer comment: no physical assistance required for standing. from elevated bed surface    Ambulation/Gait Ambulation/Gait assistance: Supervision Gait Distance (Feet): 70 Feet Assistive device: Rolling walker (2 wheels)         General Gait Details: no LOB. ambulated in bouts today. spO2 ranging from 86-91% on room air, able to perform PLB   Stairs             Wheelchair Mobility    Modified Rankin (Stroke Patients Only)       Balance Overall balance assessment: Needs assistance Sitting-balance support: Feet supported Sitting balance-[name]: Normal     Standing balance support: Bilateral upper extremity supported;During functional activity Standing balance-[name]: Fair Standing balance comment: unilateral support needed for pericare                            Cognition Arousal/Alertness: Awake/alert Behavior During Therapy: WFL for tasks assessed/performed Overall Cognitive Status: Within Functional Limits for tasks assessed                                          Exercises      General Comments        Pertinent Vitals/Pain Pain Assessment: No/denies pain    Home Living                          Prior Function            PT Goals (current  goals can now be found in the care plan section) Progress towards PT goals: Progressing toward goals    Frequency    Min 2X/week      PT Plan Discharge plan needs to be updated;Current plan remains appropriate    Co-evaluation              AM-PAC PT "6 Clicks" Mobility   Outcome Measure  Help needed turning from your back to your side while in a flat bed without using bedrails?: A Little Help needed moving from lying on your back to sitting on the side of a flat bed without using bedrails?: A Little Help needed moving to and from a bed to a chair (including a wheelchair)?: A Little Help needed standing up from a chair using your  arms (e.g., wheelchair or bedside chair)?: A Little Help needed to walk in hospital room?: A Little Help needed climbing 3-5 steps with a railing? : A Lot 6 Click Score: 17    End of Session   Activity Tolerance: Patient tolerated treatment well Patient left: in bed;with call bell/phone within reach Nurse Communication: Mobility status (discussed with nurse aide) PT Visit Diagnosis: Muscle weakness (generalized) (M62.81)     Time: 0932-3557 PT Time Calculation (min) (ACUTE ONLY): 39 min  Charges:  $Therapeutic Exercise: 23-37 mins $Therapeutic Activity: 8-22 mins                     Olga Coaster PT, DPT 1:13 PM,01/19/21

## 2021-01-19 NOTE — TOC Progression Note (Signed)
Transition of Care [facility]) - Progression Note    Patient Details  Name: Glendale Youngblood MRN: [id number] Date of Birth: May 06, 1975  Transition of [facility]) CM/SW Contact  Stokes Cellar, RN Phone Number: 01/19/2021, 3:57 PM  Clinical Narrative:    Secure email sent to [facility] requesting update on status of Medicaid and Disability request.    Expected Discharge Plan: Homeless Shelter Barriers to Discharge: Continued Medical Work up  Expected Discharge Plan and Services Expected Discharge Plan: Homeless Shelter In-house Referral: Clinical Social Work   Post Acute Care Choice: NA Living arrangements for the past 2 months: Homeless Shelter Expected Discharge Date: 01/18/21                                     Social Determinants of Health (SDOH) Interventions    Readmission Risk Interventions Readmission Risk Prevention Plan 10/11/2020  Post Dischage Appt Complete  Medication Screening Complete  Transportation Screening Complete  Some recent data might be hidden

## 2021-01-19 NOTE — Evaluation (Signed)
Occupational Therapy Evaluation Patient Details Name: Christian [name] MRN: [id number] DOB: [birth year] Today's Date: 01/19/2021   History of Present Illness Pt is a 45 y/o M admitted on 01/08/21 with c/c of SOB. Pt noted to be positive for Influenza A. Pt noted to have cellulitic area to R buttock. Imaging reveals multifocal PNA in the R lung. PMH: morbid obesity, HTN   Clinical Impression   Christian [name] presents with generalized weakness, limited endurance, shortness of breath, and impaired balance. Prior to admission, pt had been living first in his car and then in a homeless shelter. Given his medical needs, Mr. Able is no longer eligible to return to the homeless shelter where he previously lived. During today's session, his O2 sats were 86% in supine, on room air. Applied 2L [place], with O2 levels increasing over next 10 minutes to low 90s. Pt able to come into standing with CGA and RW, ambulates in room and attempts grooming at sink, but expresses fatigue and SOB after ~ 1 minute, as well as L knee pain in standing. Pt demonstrates need for assistance with LB dressing, voices difficulty with post-toileting pericare and wound care management and expresses interest in weight loss, exercise program, and overall health improvement. Recommend ongoing OT while hospitalized, with outpatient or HHOT, as appropriate, upon DC.     Recommendations for follow up therapy are one component of a multi-disciplinary discharge planning process, led by the attending physician.  Recommendations may be updated based on patient status, additional functional criteria and insurance authorization.   Follow Up Recommendations  Home health OT    Assistance Recommended at Discharge Intermittent Supervision/Assistance  Functional Status Assessment  Patient has had a recent decline in their functional status and demonstrates the ability to make significant improvements in function in a reasonable and predictable amount of  time.  Equipment Recommendations       Recommendations for Other Services       Precautions / Restrictions Precautions Precautions: Fall Precaution Comments: droplet Restrictions Weight Bearing Restrictions: No      Mobility Bed Mobility Overal bed mobility: Modified Independent             General bed mobility comments: HOB slightly elevated. increased effort required    Transfers Overall transfer level: Needs assistance Equipment used: Rolling walker (2 wheels) Transfers: Sit to/from Stand Sit to Stand: Min guard           General transfer comment: required several tries to rise from recliner before achieving standing position      Balance Overall balance assessment: Needs assistance Sitting-balance support: Feet supported Sitting balance-[name]: Normal     Standing balance support: Bilateral upper extremity supported;During functional activity;No upper extremity supported Standing balance-[name]: Fair Standing balance comment: requires b/l UE support for dynamic standing, can maintain static standing with one UE on RW                           ADL either performed or assessed with clinical judgement   ADL Overall ADL's : Needs assistance/impaired     Grooming: Wash/dry hands;Wash/dry face;Oral care;Standing Grooming Details (indicate cue type and reason): using sink and RW for UE support, tiring after ~ 1 minute             Lower Body Dressing: Minimal assistance Lower Body Dressing Details (indicate cue type and reason): donning/doffing socks  Vision         Perception     Praxis      Pertinent Vitals/Pain Pain Assessment: No/denies pain Pain Location: L knee pain in standing, denies pain in supine/sitting Pain Intervention(s): Limited activity within patient's tolerance     Hand Dominance Right   Extremity/Trunk Assessment Upper Extremity Assessment Upper Extremity Assessment:  Overall WFL for tasks assessed   Lower Extremity Assessment Lower Extremity Assessment: Overall WFL for tasks assessed       Communication Communication Communication: No difficulties   Cognition Arousal/Alertness: Awake/alert Behavior During Therapy: WFL for tasks assessed/performed Overall Cognitive Status: Within Functional Limits for tasks assessed                                 General Comments: patient able to follow all commands without difficulty and to provide detailed history of PLOF     General Comments       Exercises Other Exercises Other Exercises: Mobility, transfers, grooming, dressing. Educ re: HEP, rehab process, DC options, health goals   Shoulder Instructions      Home Living Family/patient expects to be discharged to:: Shelter/Homeless                                 Additional Comments: Pt was living in homeless shelter prior to admission.      Prior Functioning/Environment Prior Level of Function : Independent/Modified Independent             Mobility Comments: Pt reports he had progressed to ambulating without AD but used RW to aide with sit>stand.          OT Problem List: Decreased strength;Impaired balance (sitting and/or standing);Decreased range of motion;Obesity;Decreased activity tolerance      OT Treatment/Interventions: Self-care/ADL training;DME and/or AE instruction;Therapeutic activities;Balance training;Therapeutic exercise;Patient/family education;Energy conservation    OT Goals(Current goals can be found in the care plan section) Acute Rehab OT Goals Patient Stated Goal: to improve overall health OT Goal Formulation: With patient Time For Goal Achievement: 02/02/21 Potential to Achieve Goals: Good ADL Goals Pt Will Perform Grooming: with modified independence;standing (in standing for 5+ minutes) Pt Will Perform Lower Body Dressing: with supervision;sitting/lateral leans;sit to/from stand  (using AE as needed) Pt Will Perform Toileting - Clothing Manipulation and hygiene: with supervision;sit to/from stand;sitting/lateral leans (using AE as necessary) Pt/caregiver will Perform Home Exercise Program: Increased ROM;Increased strength;With written HEP provided;With theraband;Independently (for strength, endurance, anxiety mgmt, weight mgmt)  OT Frequency: Min 2X/week   Barriers to D/C: Inaccessible home environment  homeless; unable to return to homeless shelter where he was previously living       Co-evaluation              AM-PAC OT "6 Clicks" Daily Activity     Outcome Measure Help from another person eating meals?: None Help from another person taking care of personal grooming?: A Little Help from another person toileting, which includes using toliet, bedpan, or urinal?: A Lot Help from another person bathing (including washing, rinsing, drying)?: A Little Help from another person to put on and taking off regular upper body clothing?: A Little Help from another person to put on and taking off regular lower body clothing?: A Lot 6 Click Score: 17   End of Session Equipment Utilized During Treatment: Rolling walker (2 wheels)  Activity Tolerance: Patient tolerated treatment  well Patient left: in chair;with call bell/phone within reach;with chair alarm set  OT Visit Diagnosis: Unsteadiness on feet (R26.81);Muscle weakness (generalized) (M62.81)                Time: 6734-1937 OT Time Calculation (min): 39 min Charges:  OT General Charges $OT Visit: 1 Visit OT Evaluation $OT Eval Moderate Complexity: 1 Mod OT Treatments $Self Care/Home Management : 23-37 mins $Therapeutic Activity: 8-22 mins Latina Craver, PhD, MS, OTR/L 01/19/21, 4:15 PM

## 2021-01-19 NOTE — Progress Notes (Signed)
[facility] PROGRESS NOTE  Patient: Christian [name] VBT:[id number]   PCP: Pcp, No DOB: [birth year]   DOA: 01/08/2021   DOS: 01/19/2021    Subjective: Continues to have shortness of breath.  After working with OT patient was placed on back on oxygen.  No nausea or vomiting.  Not sure if the reading was accurate.  Objective:  Vitals:   01/19/21 1417 01/19/21 1549  BP:  104/69  Pulse:  83  Resp:  20  Temp:  98.7 F (37.1 C)  SpO2: 90% 91%    S1-S2 present. Clear to auscultation. Faint basal crackles. No edema.  Assessment and plan: Continue to identify appropriate dispo for the patient. Continue to wean. Incentive spirometry.   Author: Lynden Oxford, MD [facility] 01/19/2021 7:12 PM   If 7PM-7AM, please contact night-coverage at [email or web address]

## 2021-01-20 LAB — BASIC METABOLIC PANEL
Anion gap: 8 (ref 5–15)
BUN: 13 mg/dL (ref 6–20)
CO2: 30 mmol/L (ref 22–32)
Calcium: 8.6 mg/dL — ABNORMAL LOW (ref 8.9–10.3)
Chloride: 98 mmol/L (ref 98–111)
Creatinine, Ser: 0.81 mg/dL (ref 0.61–1.24)
GFR, Estimated: >60 mL/min (ref >60)
Glucose, Bld: 104 mg/dL — ABNORMAL HIGH (ref 70–99)
Potassium: 3.5 mmol/L (ref 3.5–5.1)
Sodium: 136 mmol/L (ref 135–145)

## 2021-01-20 LAB — CBC
HCT: 39.7 % (ref 39.0–52.0)
Hemoglobin: 12.2 g/dL — ABNORMAL LOW (ref 13.0–17.0)
MCH: 24.5 pg — ABNORMAL LOW (ref 26.0–34.0)
MCHC: 30.7 g/dL (ref 30.0–36.0)
MCV: 79.9 fL — ABNORMAL LOW (ref 80.0–100.0)
Platelets: 422 10*3/uL — ABNORMAL HIGH (ref 150–400)
RBC: 4.97 MIL/uL (ref 4.22–5.81)
RDW: 15.9 % — ABNORMAL HIGH (ref 11.5–15.5)
WBC: 8.4 10*3/uL (ref 4.0–10.5)
nRBC: 0 % (ref 0.0–0.2)

## 2021-01-20 MED ORDER — IPRATROPIUM-ALBUTEROL 0.5-2.5 (3) MG/3ML IN SOLN
3.0000 mL | Freq: Three times a day (TID) | RESPIRATORY_TRACT | Status: DC
  Administered 2021-01-21 – 2021-01-24 (×10): 3 mL via RESPIRATORY_TRACT
  Filled 2021-01-20 (×10): qty 3

## 2021-01-20 NOTE — Progress Notes (Signed)
Physical Therapy Treatment Patient Details Name: Christian [name] MRN: [id number] DOB: 26-Feb-1976 Today's Date: 01/20/2021   History of Present Illness Pt is a 45 y/o M admitted on 01/08/21 with c/c of SOB. Pt noted to be positive for Influenza A. Pt noted to have cellulitic area to R buttock. Imaging reveals multifocal PNA in the R lung. PMH: morbid obesity, HTN    PT Comments    Pt was long sitting in bed upon arriving. He agrees to session and is cooperative throughout. On 2 L o2 at rest with sao2 91%. Author removed O2 for session however pt quickly desaturates and becomes SOB after only ambulating ~ 93ft. Desaturates to 85% on rm air air. Returned 2L o2 + several breathing exercises during standing rest. Eventually pt recovers to 90% but after prolonged recovery time. He continues to be limited by activity tolerance. SOB noted after very minimal exercise. Acute PT will continue to follow and progress as able per current POC. Pt is homeless but will greatly benefit from any post acute PT he can get.     Recommendations for follow up therapy are one component of a multi-disciplinary discharge planning process, led by the attending physician.  Recommendations may be updated based on patient status, additional functional criteria and insurance authorization.  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 does have to use momentum to achieve standing    Ambulation/Gait Ambulation/Gait assistance: Supervision Gait Distance (Feet): 120 Feet Assistive device: Rolling walker (2 wheels) Gait Pattern/deviations: Step-through pattern Gait velocity: decreased     General Gait Details: Pt contiues to desaturate to 85% on rm air with exertion. He becomes slightly SOB with minimal activity. returned to 2 L o2 and pt able to recover to 90 % after several minutes.      Balance Overall balance assessment: Needs assistance Sitting-balance support: Feet supported Sitting balance-[name]: Normal     Standing balance support: Bilateral upper extremity supported;During functional activity;No upper extremity supported Standing balance-[name]: Good Standing balance comment: no LOB with UE support       Cognition Arousal/Alertness: Awake/alert Behavior During Therapy: WFL for tasks assessed/performed Overall Cognitive Status: Within Functional Limits for tasks assessed    General Comments: Pt is A and O x 4               Pertinent Vitals/Pain Pain Assessment: 0-10 Pain Score: 2  Faces Pain Scale: Hurts a little bit Pain Location: L knee pain in standing, denies pain in supine/sitting Pain Descriptors / Indicators: Discomfort;Sore Pain Intervention(s): Limited activity within patient's tolerance;Monitored during session;Repositioned     PT Goals (current goals can now be found in the care plan section) Acute Rehab PT Goals Patient Stated Goal: none stated Progress  towards PT goals: Progressing toward goals    Frequency    Min 2X/week      PT Plan Discharge plan needs to be updated;Current plan remains appropriate       AM-PAC PT "6 Clicks" Mobility   Outcome  Measure  Help needed turning from your back to your side while in a flat bed without using bedrails?: A Little Help needed moving from lying on your back to sitting on the side of a flat bed without using bedrails?: A Little Help needed moving to and from a bed to a chair (including a wheelchair)?: A Little Help needed standing up from a chair using your arms (e.g., wheelchair or bedside chair)?: A Little Help needed to walk in hospital room?: A Little Help needed climbing 3-5 steps with a railing? : A Lot 6 Click Score: 17    End of Session Equipment Utilized During Treatment: Oxygen Activity Tolerance: Patient limited by fatigue;Patient tolerated treatment well Patient left: in chair;with call bell/phone within reach;with chair alarm set Nurse Communication: Mobility status PT Visit Diagnosis: Muscle weakness (generalized) (M62.81)     Time: 2585-2778 PT Time Calculation (min) (ACUTE ONLY): 23 min  Charges:  $Gait Training: 8-22 mins $Therapeutic Activity: 8-22 mins                     Jetta Lout PTA 01/20/21, 9:12 AM

## 2021-01-20 NOTE — Progress Notes (Signed)
[facility] Progress Note  Patient: Christian [name]    IZT:[id number]  DOA: 01/08/2021    Date of Service: the patient was seen and examined on 01/20/2021  Brief hospital course: Patient with morbid obesity presented with numbness shortness of breath.  Found to have influenza A causing sepsis along with acute hypoxic respiratory failure. Also being treated for OSA. Unable to discharge on oxygen therefore we will continue to wean oxygen in the hospital.  11/19: Patient is still on 2 L oxygen during the day and BiPAP during the night.  Weaning efforts continues 11/20 -required BiPAP last night.  On room air this morning.  Waiting on homeless shelter to confirm if he can go there 11/22 back on oxygen.   Assessment and Plan: * Influenza A with pneumonia Sepsis, present on admission. On admission, symptomatic with fever, hypoxia, tachypnea. CTA neg for PE. CTA does show multifocal pneumonia.  Procal 0.2, low concern for bacterial pneumonia. Sepsis physiology resolved Completed course of tamiflu. Inform patient that he will continue to have symptoms for next 2 to 3 weeks given severity of his flu.  Obesity hypoventilation syndrome (HCC) Was on BiPAP. Patient will not continue BiPAP at homeless shelter. Patient was able to be weaned off of BiPAP.  Acute respiratory failure with hypoxia (HCC) Pt was severely hypoxemic.   Requiring 8 L of oxygen and BiPAP therapy. Now he is weaned off to room air on rest. Overnight without BiPAP oxygenation remained notable 88 to 89%.  Cellulitis and abscess of buttock Completed antibiotic with Augmentin  Chronic diastolic CHF (congestive heart failure) (HCC) Currently on Lasix 40 mg 3 times daily.  Monitor renal function Net IO Since Admission: -10,808.27 mL [01/18/21 1912]  Essential hypertension Blood pressure stable   Prediabetes A1c 6.  Obesity, Class III, BMI 40-49.9 (morbid obesity) (HCC) Placing the patient at high risk for poor  outcome.  Counseled for weight loss.  This is his biggest issue leading to a lot of other related complications Body mass index is 60.98 kg/m.   Hypokalemia Repleted and resolved.  Homelessness TOC following. Patient's current homeless shelter has refused the patient. TOC currently working for disposition. Currently back on oxygen  Currently remaining off of BiPAP.  Pressure ulcer of buttock Stage III.  Continue current daily dressing. Treated with antibiotics.  Continue wound care.  Body mass index is 60.98 kg/m.       Subjective: Appears to have some muffled voice.  Has some shortness of breath as well.  No chest pain abdominal pain.  Objective: Not hypoxic.  Needing oxygen 2 LPM.  Exam: General: Appear in mild distress, no Rash; Oral Mucosa Clear, moist. no Abnormal Neck Mass Or lumps, Conjunctiva normal  Cardiovascular: S1 and S2 Present, no Murmur, Respiratory: good respiratory effort, Bilateral Air entry present and CTA, no Crackles, no wheezes Abdomen: Bowel Sound present, Soft and no tenderness Extremities: no Pedal edema Neurology: alert and oriented to time, place, and person affect appropriate. no new focal deficit Gait not checked due to patient safety concerns    Data Reviewed: My review of labs, imaging, notes and other tests shows no new significant findings.   Disposition:  Status is: Inpatient  Remains inpatient appropriate because: Remains hypoxic.  Unable to find a place for patient.  TOC team following.   Family Communication: 35  DVT Prophylaxis: SCDs Start: 01/08/21 2330   Time spent: 35 minutes.   Author: Lynden Oxford  01/20/2021 5:47 PM  To reach On-call, see care  teams to locate the attending and reach out via [email or web address]. Between 7PM-7AM, please contact night-coverage If you still have difficulty reaching the attending provider, please page the [facility] (Director on Call) for [facility] on amion for assistance.

## 2021-01-20 NOTE — Progress Notes (Signed)
Occupational Therapy Treatment Patient Details Name: Christian [name] MRN: [id number] DOB: 07-Jan-1976 Today's Date: 01/20/2021   History of present illness Pt is a 45 y/o M admitted on 01/08/21 with c/c of SOB. Pt noted to be positive for Influenza A. Pt noted to have cellulitic area to R buttock. Imaging reveals multifocal PNA in the R lung. PMH: morbid obesity, HTN   OT comments  Pt seen for OT tx this date to f/u re: safety with ADLs/ADL mobility. Pt able to CTS with bari RW with SUPV with only one cue for sequence and one cue for pacing. His O2 saturation is noted to drop slightly with light activity from 91% on 2Lnc to 88% of 2Lnc, requiring ~20-30 seconds rest to recover. OT engages pt in the below-listed tricep and core exercises to improve strength for IADL and more dynamic tasks. Pt tolerates well. Left in chair with all needs met and in reach. O2 stable on 2Lnc end of session.    Recommendations for follow up therapy are one component of a multi-disciplinary discharge planning process, led by the attending physician.  Recommendations may be updated based on patient status, additional functional criteria and insurance authorization.    Follow Up Recommendations  Home health OT    Assistance Recommended at Discharge Intermittent Supervision/Assistance  Equipment Recommendations       Recommendations for Other Services      Precautions / Restrictions Precautions Precautions: Fall Precaution Comments: droplet Restrictions Weight Bearing Restrictions: No       Mobility Bed Mobility               General bed mobility comments: up to chair pre/post    Transfers Overall transfer level: Needs assistance Equipment used: Rolling walker (2 wheels) Transfers: Sit to/from Stand Sit to Stand: Supervision           General transfer comment: STS wiht bari RW with one cue to push from arm rests, no physical assistance, cues for pacing. O2 does drop slightly while on 2L  from 91 to 88% with STS and pt recovers withing ~20-30 seconds static standing rest     Balance Overall balance assessment: Needs assistance Sitting-balance support: Feet supported Sitting balance-[name]: Normal     Standing balance support: Bilateral upper extremity supported;During functional activity;No upper extremity supported Standing balance-[name]: Good                             ADL either performed or assessed with clinical judgement   ADL                                              Extremity/Trunk Assessment              Vision       Perception     Praxis      Cognition Arousal/Alertness: Awake/alert Behavior During Therapy: WFL for tasks assessed/performed Overall Cognitive Status: Within Functional Limits for tasks assessed                                 General Comments: Pt is A and O x 4          Exercises Other Exercises Other Exercises: OT engages pt in seated contalateral reaching, seated  oblique reaches, and modified chair/tricep push ups x10 each to improve strength for dynamic tasks as well as IADL tasks.   Shoulder Instructions       General Comments      Pertinent Vitals/ Pain       Pain Assessment: Faces Faces Pain Scale: Hurts a little bit Pain Location: L knee pain in standing, denies pain in supine/sitting Pain Descriptors / Indicators: Discomfort;Sore Pain Intervention(s): Limited activity within patient's tolerance;Monitored during session;Repositioned  Home Living                                          Prior Functioning/Environment              Frequency  Min 2X/week        Progress Toward Goals  OT Goals(current goals can now be found in the care plan section)  Progress towards OT goals: Progressing toward goals  Acute Rehab OT Goals Patient Stated Goal: to improve overall health OT Goal Formulation: With patient Time For Goal  Achievement: 02/02/21 Potential to Achieve Goals: Good  Plan Discharge plan remains appropriate    Co-evaluation                 AM-PAC OT "6 Clicks" Daily Activity     Outcome Measure   Help from another person eating meals?: None Help from another person taking care of personal grooming?: A Little Help from another person toileting, which includes using toliet, bedpan, or urinal?: A Lot Help from another person bathing (including washing, rinsing, drying)?: A Little Help from another person to put on and taking off regular upper body clothing?: A Little Help from another person to put on and taking off regular lower body clothing?: A Lot 6 Click Score: 17    End of Session Equipment Utilized During Treatment: Rolling walker (2 wheels)  OT Visit Diagnosis: Unsteadiness on feet (R26.81);Muscle weakness (generalized) (M62.81)   Activity Tolerance Patient tolerated treatment well   Patient Left in chair;with call bell/phone within reach;with chair alarm set   Nurse Communication Mobility status        Time: 1032-1100 OT Time Calculation (min): 28 min  Charges: OT General Charges $OT Visit: 1 Visit OT Treatments $Therapeutic Activity: 8-22 mins $Therapeutic Exercise: 8-22 mins  Gerrianne Scale, [place], OTR/L ascom [phone] 01/20/21, 4:50 PM

## 2021-01-21 MED ORDER — FLUTICASONE PROPIONATE 50 MCG/ACT NA SUSP
2.0000 | Freq: Every day | NASAL | Status: DC
  Administered 2021-01-21 – 2021-02-12 (×23): 2 via NASAL
  Filled 2021-01-21: qty 16

## 2021-01-21 NOTE — Progress Notes (Signed)
[facility] Progress Note  Patient: Christian [name]    VOJ:[id number]  DOA: 01/08/2021    Date of Service: the patient was seen and examined on 01/21/2021  Brief hospital course: Patient with morbid obesity presented with numbness shortness of breath.  Found to have influenza A causing sepsis along with acute hypoxic respiratory failure. Also being treated for OSA. Unable to discharge on oxygen therefore we will continue to wean oxygen in the hospital.  11/19: Patient is still on 2 L oxygen during the day and BiPAP during the night.  Weaning efforts continues 11/20 -required BiPAP last night.  On room air this morning.  Waiting on homeless shelter to confirm if he can go there 11/22 back on oxygen.  Augmentin 11/14- 11-18 Culture: STAPHYLOCOCCUS HOMINIS 11/11 1 out 4.   Influenza A by PCR POSITIVE Abnormal     Assessment and Plan: * Influenza A with pneumonia Sepsis, present on admission. On admission, symptomatic with fever, hypoxia, tachypnea. CTA neg for PE. CTA does show multifocal pneumonia.  Procal 0.2, low concern for bacterial pneumonia. Sepsis physiology resolved Completed course of tamiflu. Inform patient that he will continue to have symptoms for next 2 to 3 weeks given severity of his flu.  Obesity hypoventilation syndrome (HCC) Was on BiPAP. Patient will not continue BiPAP at homeless shelter. Patient was able to be weaned off of BiPAP.  Acute respiratory failure with hypoxia (HCC) Pt was severely hypoxemic.   Requiring 8 L of oxygen and BiPAP therapy. Overnight without BiPAP oxygenation remained notable 88 to 89%. Back on 2LPM. Incentive spirometry and flutter valve ordered.  Flonase ordered.   Cellulitis and abscess of buttock Completed antibiotic with Augmentin  Chronic diastolic CHF (congestive heart failure) (HCC) Currently on Lasix 40 mg 3 times daily.  Monitor renal function Net IO Since Admission: -10,808.27 mL [01/18/21 1912]  Essential  hypertension Blood pressure stable   Prediabetes A1c 6.  Obesity, Class III, BMI 40-49.9 (morbid obesity) (HCC) Placing the patient at high risk for poor outcome.  Counseled for weight loss.  This is his biggest issue leading to a lot of other related complications Body mass index is 60.98 kg/m.   Hypokalemia Repleted and resolved.  Homelessness TOC following. Patient's current homeless shelter has refused the patient. TOC currently working for disposition. Currently back on oxygen  Currently remaining off of BiPAP.  Pressure ulcer of buttock Stage III.  Continue current daily dressing. Treated with antibiotics.  Continue wound care.    Body mass index is 60.98 kg/m.     Subjective: continue to have shortness of breath cough, some head congestion   Objective: Vital signs were reviewed and unremarkable.  Exam: General: Appear in mild distress, no Rash; Oral Mucosa Clear, moist. no Abnormal Neck Mass Or lumps, Conjunctiva normal  Cardiovascular: S1 and S2 Present, no Murmur, Respiratory: increased respiratory effort, Bilateral Air entry present and bilateral  Crackles, no wheezes Abdomen: Bowel Sound present, Soft and no tenderness Extremities: trace Pedal edema Neurology: alert and oriented to time, place, and person affect appropriate. no new focal deficit Gait not checked due to patient safety concerns    Data Reviewed: There are no new results to review at this time.  Disposition:  Status is: Inpatient  Remains inpatient appropriate because: unsafe discharge, back on oxygen    Family Communication: none at bedside.   DVT Prophylaxis: SCDs Start: 01/08/21 2330   Time spent: 35 minutes.   Author: Lynden Oxford  01/21/2021 4:48 PM  To  reach On-call, see care teams to locate the attending and reach out via [email or web address]. Between 7PM-7AM, please contact night-coverage If you still have difficulty reaching the attending provider, please page the [facility]  (Director on Call) for [facility] on amion for assistance.

## 2021-01-21 NOTE — Progress Notes (Signed)
Per Lynden Oxford, MD okay to monitor patient without IV for now.

## 2021-01-22 MED ORDER — TORSEMIDE 20 MG PO TABS
20.0000 mg | ORAL_TABLET | Freq: Two times a day (BID) | ORAL | Status: DC
  Administered 2021-01-22 – 2021-02-12 (×42): 20 mg via ORAL
  Filled 2021-01-22 (×44): qty 1

## 2021-01-22 NOTE — Progress Notes (Signed)
[facility] PROGRESS NOTE  Patient: Christian [name] [id number]:[id number]   PCP: Pcp, No DOB: January 16, 1976   DOA: 01/08/2021   DOS: 01/22/2021    Subjective: Continues to have cough.  Will discharge shortness of breath.  Denies any nausea or vomiting.  Actually feeling better than yesterday.  Objective:  Vitals:   01/22/21 1048 01/22/21 1502  BP: 127/74 127/81  Pulse: 74 77  Resp: 19 19  Temp: 98.4 F (36.9 C) 98 F (36.7 C)  SpO2: 92% 92%    General: Appear in mild distress, no Rash; Oral Mucosa Clear, moist. no Abnormal Neck Mass Or lumps, Conjunctiva normal  Cardiovascular: S1 and S2 Present, no Murmur, Respiratory: good respiratory effort, Bilateral Air entry present and CTA, no Crackles, no wheezes Abdomen: Bowel Sound present, Soft and no tenderness Extremities: no Pedal edema Neurology: alert and oriented to time, place, and person affect appropriate. no new focal deficit Gait not checked due to patient safety concerns   Assessment and plan: Hypoxic respiratory failure.  Influenza and pneumonia. Oxygenation improving.  Now back on room air. Continue incentive spirometry.  Educated patient about the technique.  Chronic diastolic CHF. Volume status unchanged despite increased dose of the Lasix from 40 mg twice daily to 40 mg 3 times daily. I will change to torsemide and monitor. Monitor renal function.  Author: Lynden Oxford, MD [facility] 01/22/2021 5:25 PM   If 7PM-7AM, please contact night-coverage at [email or web address]

## 2021-01-22 NOTE — Progress Notes (Signed)
Occupational Therapy Treatment Patient Details Name: Christian [name] MRN: [id number] DOB: 05/01/1975 Today's Date: 01/22/2021   History of present illness Pt is a 45 y/o M admitted on 01/08/21 with c/c of SOB. Pt noted to be positive for Influenza A. Pt noted to have cellulitic area to R buttock. Imaging reveals multifocal PNA in the R lung. PMH: morbid obesity, HTN   OT comments  Pt seen for OT tx this date to f/u re: safety with ADLs/ADL mobility. OT engages pt in STS x10 w/o use of UE support with addition of high-reaching bilaterally once standing to incorporate core strengthening. Pt does de-saturate on room air to 86% temporarily with this activity, requiring ~30 seconds to recover, but is otherwise sustaining at 89-91% on room air throughout session. After therex, OT engages pt in LB bathing with MIN A for thorough completion of posterior LB bathing in standing and provides SETUP for UB dressing in sitting. Pt left in chair with all needs met and in reach at end of session. Will continue to follow acutely.    Recommendations for follow up therapy are one component of a multi-disciplinary discharge planning process, led by the attending physician.  Recommendations may be updated based on patient status, additional functional criteria and insurance authorization.    Follow Up Recommendations  Home health OT    Assistance Recommended at Discharge Intermittent Supervision/Assistance  Equipment Recommendations  BSC/3in1;Tub/shower seat    Recommendations for Other Services      Precautions / Restrictions Precautions Precautions: Fall Precaution Comments: droplet Restrictions Weight Bearing Restrictions: No       Mobility Bed Mobility               General bed mobility comments: pt was in recliner pre/post session    Transfers Overall transfer level: Needs assistance Equipment used: Rolling walker (2 wheels) Transfers: Sit to/from Stand Sit to Stand: Supervision            General transfer comment: practiced CTS with no use of UE for propulsion and pt initially requiring CGA to CTS w/o pushing from arm rests of chair, but progresses with confidence to CTS w/o UE support     Balance Overall balance assessment: Needs assistance Sitting-balance support: Feet supported Sitting balance-[name]: Normal     Standing balance support: Bilateral upper extremity supported;During functional activity;No upper extremity supported Standing balance-[name]: Good                             ADL either performed or assessed with clinical judgement   ADL Overall ADL's : Needs assistance/impaired             Lower Body Bathing: Minimal assistance;Sit to/from stand Lower Body Bathing Details (indicate cue type and reason): bari RW for balance, MIN A for balance with dynamic reaching for posterior LB in standing Upper Body Dressing : Set up;Sitting                          Extremity/Trunk Assessment              Vision       Perception     Praxis      Cognition Arousal/Alertness: Awake/alert Behavior During Therapy: WFL for tasks assessed/performed Overall Cognitive Status: Within Functional Limits for tasks assessed  General Comments: Pt is A and O x 4          Exercises Other Exercises Other Exercises: OT engages pt in STS x10 w/o use of UE support with addition of high-reaching bilaterally once standing to incorporate core strengthening. Pt does de-saturate on room air to 86% temporarily with this activity, but is otherwise sustaining at 89-91% on room air throughout session.   Shoulder Instructions       General Comments      Pertinent Vitals/ Pain       Pain Assessment: No/denies pain  Home Living                                          Prior Functioning/Environment              Frequency  Min 2X/week        Progress  Toward Goals  OT Goals(current goals can now be found in the care plan section)  Progress towards OT goals: Progressing toward goals  Acute Rehab OT Goals Patient Stated Goal: to improve overall health OT Goal Formulation: With patient Time For Goal Achievement: 02/02/21 Potential to Achieve Goals: Good  Plan Discharge plan remains appropriate    Co-evaluation                 AM-PAC OT "6 Clicks" Daily Activity     Outcome Measure   Help from another person eating meals?: None Help from another person taking care of personal grooming?: A Little Help from another person toileting, which includes using toliet, bedpan, or urinal?: A Little Help from another person bathing (including washing, rinsing, drying)?: A Little Help from another person to put on and taking off regular upper body clothing?: A Little Help from another person to put on and taking off regular lower body clothing?: A Lot 6 Click Score: 18    End of Session Equipment Utilized During Treatment: Rolling walker (2 wheels)  OT Visit Diagnosis: Unsteadiness on feet (R26.81);Muscle weakness (generalized) (M62.81)   Activity Tolerance Patient tolerated treatment well   Patient Left in chair;with call bell/phone within reach;with chair alarm set   Nurse Communication Mobility status        Time: 7412-8786 OT Time Calculation (min): 26 min  Charges: OT General Charges $OT Visit: 1 Visit OT Treatments $Self Care/Home Management : 8-22 mins $Therapeutic Exercise: 8-22 mins  Gerrianne Scale, [place], OTR/L ascom [phone] 01/22/21, 6:03 PM

## 2021-01-22 NOTE — Progress Notes (Signed)
Physical Therapy Treatment Patient Details Name: Christian [name] MRN: [id number] DOB: 02/09/1976 Today's Date: 01/22/2021   History of Present Illness Pt is a 45 y/o M admitted on 01/08/21 with c/c of SOB. Pt noted to be positive for Influenza A. Pt noted to have cellulitic area to R buttock. Imaging reveals multifocal PNA in the R lung. PMH: morbid obesity, HTN    PT Comments    Pt was sitting in recliner upon arriving. He was on 2 L o2 however O2 removed throughout session with pt able to maintain > 88% sao2. Mostly between 89%-91% during ambulation. He was able to stand to bariatric RW and ambulate 300 ft with +2 standing rest. Slightly SOB but overall greatly improved from two days prior. Continues to demonstrate improving activity tolerance and safety with all ADLs. Recommend continued skilled PT going forward to address deficits while maximizing independence.    Recommendations for follow up therapy are one component of a multi-disciplinary discharge planning process, led by the attending physician.  Recommendations may be updated based on patient status, additional functional criteria and insurance authorization.  Follow Up Recommendations  Outpatient PT     Assistance Recommended at Discharge Intermittent Supervision/Assistance  Equipment Recommendations  None recommended by PT       Precautions / Restrictions Precautions Precautions: Fall Precaution Comments: droplet Restrictions Weight Bearing Restrictions: No     Mobility  Bed Mobility    General bed mobility comments: pt was in recliner pre/post session    Transfers Overall transfer level: Needs assistance Equipment used: Rolling walker (2 wheels) (Bariatric) Transfers: Sit to/from Stand Sit to Stand: Supervision   Ambulation/Gait Ambulation/Gait assistance: Supervision Gait Distance (Feet): 300 Feet Assistive device: Rolling walker (2 wheels) (Bariatric) Gait Pattern/deviations: Step-through pattern Gait  velocity: decreased     General Gait Details: pt was able to ambulate 300 ft with RW withuot O2. sao2 >88% throughout      Balance Overall balance assessment: Needs assistance Sitting-balance support: Feet supported Sitting balance-[name]: Normal     Standing balance support: Bilateral upper extremity supported;During functional activity;No upper extremity supported Standing balance-[name]: Good Standing balance comment: no LOB with UE support       Cognition Arousal/Alertness: Awake/alert Behavior During Therapy: WFL for tasks assessed/performed Overall Cognitive Status: Within Functional Limits for tasks assessed      General Comments: Pt is A and O x 4               Pertinent Vitals/Pain Pain Assessment: No/denies pain Pain Score: 0-No pain Pain Location: L knee pain in standing, denies pain in supine/sitting Pain Descriptors / Indicators: Discomfort;Sore Pain Intervention(s): Limited activity within patient's tolerance;Monitored during session;Premedicated before session;Repositioned     PT Goals (current goals can now be found in the care plan section) Acute Rehab PT Goals Patient Stated Goal: none stated Progress towards PT goals: Progressing toward goals    Frequency    Min 2X/week      PT Plan Current plan remains appropriate       AM-PAC PT "6 Clicks" Mobility   Outcome Measure  Help needed turning from your back to your side while in a flat bed without using bedrails?: A Little Help needed moving from lying on your back to sitting on the side of a flat bed without using bedrails?: A Little Help needed moving to and from a bed to a chair (including a wheelchair)?: A Little Help needed standing up from a chair using your arms (e.g.,  wheelchair or bedside chair)?: A Little Help needed to walk in hospital room?: A Little Help needed climbing 3-5 steps with a railing? : A Little 6 Click Score: 18    End of Session Equipment Utilized  During Treatment: Oxygen Activity Tolerance: Patient limited by fatigue;Patient tolerated treatment well Patient left: in chair;with call bell/phone within reach;with chair alarm set Nurse Communication: Mobility status PT Visit Diagnosis: Muscle weakness (generalized) (M62.81)     Time: 5945-8592 PT Time Calculation (min) (ACUTE ONLY): 24 min  Charges:  $Gait Training: 8-22 mins $Therapeutic Activity: 8-22 mins                    Jetta Lout PTA 01/22/21, 11:11 AM

## 2021-01-23 LAB — BASIC METABOLIC PANEL
Anion gap: 9 (ref 5–15)
BUN: 14 mg/dL (ref 6–20)
CO2: 31 mmol/L (ref 22–32)
Calcium: 8.6 mg/dL — ABNORMAL LOW (ref 8.9–10.3)
Chloride: 98 mmol/L (ref 98–111)
Creatinine, Ser: 0.79 mg/dL (ref 0.61–1.24)
GFR, Estimated: >60 mL/min (ref >60)
Glucose, Bld: 95 mg/dL (ref 70–99)
Potassium: 3.1 mmol/L — ABNORMAL LOW (ref 3.5–5.1)
Sodium: 138 mmol/L (ref 135–145)

## 2021-01-23 LAB — MAGNESIUM: Magnesium: 2.3 mg/dL (ref 1.7–2.4)

## 2021-01-23 MED ORDER — POTASSIUM CHLORIDE CRYS ER 20 MEQ PO TBCR
40.0000 meq | EXTENDED_RELEASE_TABLET | ORAL | Status: AC
  Administered 2021-01-23 (×2): 40 meq via ORAL
  Filled 2021-01-23 (×2): qty 2

## 2021-01-23 MED ORDER — POTASSIUM CHLORIDE CRYS ER 20 MEQ PO TBCR: 20.0000 meq | EXTENDED_RELEASE_TABLET | Freq: Every day | ORAL | Status: DC

## 2021-01-23 NOTE — Plan of Care (Signed)
  Problem: Skin Integrity: Goal: Risk for impaired skin integrity will decrease Outcome: Not Progressing   

## 2021-01-23 NOTE — Assessment & Plan Note (Signed)
Patient was on Lasix 40 mg 3 times a day. Dose changed to torsemide 20 mg twice daily with significant diuretic response. Monitor daily BMP Net IO Since Admission: -73,567.01 mL [01/23/21 1855]

## 2021-01-23 NOTE — Assessment & Plan Note (Signed)
Stage III.  Continue current daily dressing. Treated with antibiotics.  Continue wound care.

## 2021-01-23 NOTE — Progress Notes (Addendum)
[facility] Progress Note  Patient: Christian [name]    UUV:[id number]  DOA: 01/08/2021    Date of Service: the patient was seen and examined on 01/23/2021  Brief hospital course: Patient with morbid obesity presented with numbness shortness of breath.  Found to have influenza A causing sepsis along with acute hypoxic respiratory failure. Also being treated for OSA. Unable to discharge on oxygen therefore we will continue to wean oxygen in the hospital.  11/19: Patient is still on 2 L oxygen during the day and BiPAP during the night.  Weaning efforts continues 11/20 -required BiPAP last night.  On room air this morning.  Waiting on homeless shelter to confirm if he can go there 11/22 back on oxygen.  Augmentin 11/14- 11-18 Culture: STAPHYLOCOCCUS HOMINIS 11/11 1 out 4.   Influenza A by PCR POSITIVE Abnormal     Assessment and Plan: * Influenza A with pneumonia Sepsis, present on admission. On admission, symptomatic with fever, hypoxia, tachypnea. CTA neg for PE. CTA does show multifocal pneumonia.  Procal 0.2, low concern for bacterial pneumonia. Sepsis physiology resolved Completed course of tamiflu.  Obesity hypoventilation syndrome (HCC) Was on BiPAP. Patient was able to be weaned off of BiPAP. Outpatient sleep study and therapy recommended.  Acute respiratory failure with hypoxia (HCC) Pt was severely hypoxemic.   Requiring 8 L of oxygen and BiPAP therapy. Now able to stay overnight without BiPAP Intermittently on oxygen. Continue incentive spirometry and flutter valve. Continue diuretics  Cellulitis and abscess of buttock Completed antibiotic with Augmentin  Chronic diastolic CHF (congestive heart failure) (HCC) Patient was on Lasix 40 mg 3 times a day. Dose changed to torsemide 20 mg twice daily with significant diuretic response. Monitor daily BMP Net IO Since Admission: -47,425.95 mL [01/23/21 1855]  Essential hypertension Blood pressure stable    Prediabetes A1c 6.  Obesity, Class III, BMI 40-49.9 (morbid obesity) (HCC) Placing the patient at high risk for poor outcome.  Counseled for weight loss.  This is his biggest issue leading to a lot of other related complications. Body mass index is 60.98 kg/m.   Hypokalemia Repleted and resolved.  Homelessness TOC following. Patient's current homeless shelter has refused the patient. TOC currently working for disposition. intermittently on oxygen.  Currently remaining off of BiPAP. TOC also working on disability paperwork.  Pressure ulcer of buttock Stage III.  Continue current daily dressing. Treated with antibiotics.  Continue wound care.    Body mass index is 60.98 kg/m.       Subjective: Denies any nausea or vomiting.  Denies any diarrhea.  Cough under control.  Has some shortness of breath.  Swelling improving.  Objective: Vital signs were reviewed and unremarkable.  Exam: General: Appear in mild distress, no Rash; Oral Mucosa Clear, moist. no Abnormal Neck Mass Or lumps, Conjunctiva normal  Cardiovascular: S1 and S2 Present, no Murmur, Respiratory: increased respiratory effort, Bilateral Air entry present and bilateral  Crackles, no wheezes Abdomen: Bowel Sound present, Soft and no tenderness Extremities: trace Pedal edema Neurology: alert and oriented to time, place, and person affect appropriate. no new focal deficit Gait not checked due to patient safety concerns    Data Reviewed: My review of labs, imaging, notes and other tests is significant for Mild hypokalemia    Disposition:  Status is: Inpatient  Remains inpatient appropriate because: Unsafe discharge.  Needs homeless shelter arrangements.  TOC supervisor working.  Medically stable.  Family Communication: No family at bedside.  DVT Prophylaxis: SCDs Start: 01/08/21  2330   Time spent: 35 minutes.   Author: Berle Mull  01/23/2021 6:57 PM  To reach On-call, see care teams to locate  the attending and reach out via [email or web address]. Between 7PM-7AM, please contact night-coverage If you still have difficulty reaching the attending provider, please page the [facility] (Director on Call) for [facility] on amion for assistance.

## 2021-01-23 NOTE — Assessment & Plan Note (Signed)
Repleted and resolved 

## 2021-01-23 NOTE — Assessment & Plan Note (Signed)
Was on BiPAP. Patient was able to be weaned off of BiPAP. Outpatient sleep study and therapy recommended.

## 2021-01-23 NOTE — Assessment & Plan Note (Signed)
Sepsis, present on admission On admission, symptomatic with fever, hypoxia, tachypnea. CTA neg for PE. CTA does show multifocal pneumonia.  Procal 0.2, low concern for bacterial pneumonia. Sepsis physiology resolved Completed course of tamiflu 

## 2021-01-23 NOTE — Assessment & Plan Note (Signed)
Completed antibiotic with Augmentin

## 2021-01-23 NOTE — Assessment & Plan Note (Signed)
Pt was severely hypoxemic.   Requiring 8 L of oxygen and BiPAP therapy. Now able to stay overnight without BiPAP Intermittently on oxygen. Continue incentive spirometry and flutter valve. Continue diuretics

## 2021-01-23 NOTE — Assessment & Plan Note (Signed)
Blood pressure stable ? ?

## 2021-01-23 NOTE — Assessment & Plan Note (Signed)
Placing the patient at high risk for poor outcome.  Counseled for weight loss.  This is his biggest issue leading to a lot of other related complications. Body mass index is 60.98 kg/m.

## 2021-01-23 NOTE — Assessment & Plan Note (Signed)
TOC following. Patient's current homeless shelter has refused the patient. TOC currently working for disposition. intermittently on oxygen.  Currently remaining off of BiPAP. TOC also working on disability paperwork.

## 2021-01-23 NOTE — Assessment & Plan Note (Signed)
A1c 6%.

## 2021-01-24 DIAGNOSIS — Z6841 Body Mass Index (BMI) 40.0 and over, adult: Secondary | ICD-10-CM

## 2021-01-24 DIAGNOSIS — E876 Hypokalemia: Secondary | ICD-10-CM

## 2021-01-24 DIAGNOSIS — A419 Sepsis, unspecified organism: Secondary | ICD-10-CM

## 2021-01-24 DIAGNOSIS — F32A Depression, unspecified: Secondary | ICD-10-CM

## 2021-01-24 LAB — BASIC METABOLIC PANEL
Anion gap: 6 (ref 5–15)
BUN: 15 mg/dL (ref 6–20)
CO2: 33 mmol/L — ABNORMAL HIGH (ref 22–32)
Calcium: 8.6 mg/dL — ABNORMAL LOW (ref 8.9–10.3)
Chloride: 100 mmol/L (ref 98–111)
Creatinine, Ser: 0.87 mg/dL (ref 0.61–1.24)
GFR, Estimated: >60 mL/min (ref >60)
Glucose, Bld: 101 mg/dL — ABNORMAL HIGH (ref 70–99)
Potassium: 3.4 mmol/L — ABNORMAL LOW (ref 3.5–5.1)
Sodium: 139 mmol/L (ref 135–145)

## 2021-01-24 LAB — MAGNESIUM: Magnesium: 2.2 mg/dL (ref 1.7–2.4)

## 2021-01-24 MED ORDER — POTASSIUM CHLORIDE CRYS ER 20 MEQ PO TBCR
40.0000 meq | EXTENDED_RELEASE_TABLET | Freq: Every day | ORAL | Status: DC
  Administered 2021-01-24 – 2021-01-31 (×8): 40 meq via ORAL
  Filled 2021-01-24 (×4): qty 2
  Filled 2021-01-24: qty 4
  Filled 2021-01-24 (×3): qty 2

## 2021-01-24 MED ORDER — IPRATROPIUM-ALBUTEROL 0.5-2.5 (3) MG/3ML IN SOLN
3.0000 mL | Freq: Two times a day (BID) | RESPIRATORY_TRACT | Status: DC
  Administered 2021-01-24 – 2021-01-25 (×2): 3 mL via RESPIRATORY_TRACT
  Filled 2021-01-24 (×2): qty 3

## 2021-01-24 NOTE — Progress Notes (Signed)
Patient ID: Christian [name], male   DOB: 08/17/1975, 45 y.o.   MRN: [id number] [facility] PROGRESS NOTE  Dalbert Stillings [place] [id number]:[id number] DOB: [birth year] DOA: 01/08/2021 PCP: Pcp, No  HPI/Subjective:   Objective: Vitals:   01/24/21 0742 01/24/21 1156  BP: 116/77 114/76  Pulse: 75 72  Resp: 18 18  Temp: 97.8 F (36.6 C) 97.7 F (36.5 C)  SpO2: 93% 95%    Intake/Output Summary (Last 24 hours) at 01/24/2021 1517 Last data filed at 01/24/2021 1156 Gross per 24 hour  Intake 720 ml  Output 2175 ml  Net -1455 ml   Filed Weights   01/08/21 1759  Weight: (!) 209.7 kg    ROS: Review of Systems  Respiratory:  Positive for cough and shortness of breath.   Cardiovascular:  Negative for chest pain.  Gastrointestinal:  Negative for abdominal pain, nausea and vomiting.  Exam: Physical Exam HENT:     Head: Normocephalic.     Mouth/Throat:     Pharynx: No oropharyngeal exudate.  Eyes:     General: Lids are normal.     Conjunctiva/sclera: Conjunctivae normal.  Cardiovascular:     Rate and Rhythm: Normal rate and regular rhythm.     Heart sounds: Normal heart sounds, S1 normal and S2 normal.  Pulmonary:     Breath sounds: Normal breath sounds. No decreased breath sounds, wheezing, rhonchi or rales.  Abdominal:     Palpations: Abdomen is soft.     Tenderness: There is no abdominal tenderness.  Musculoskeletal:     Right lower leg: Swelling present.     Left lower leg: Swelling present.  Skin:    General: Skin is warm.     Comments: Chronic bilateral lower extremity erythema  Neurological:     Mental Status: He is alert and oriented to person, place, and time.      Scheduled Meds:  docusate sodium  100 mg Oral BID   enoxaparin (LOVENOX) injection  0.5 mg/kg Subcutaneous Q24H   fluticasone  2 spray Each Nare Daily   guaiFENesin  15 mL Oral QID   ipratropium-albuterol  3 mL Nebulization BID   potassium chloride  40 mEq Oral Daily   sertraline  50 mg Oral Daily    torsemide  20 mg Oral BID     Assessment/Plan:  Acute hypoxic respiratory failure.  Patient initially with a pulse ox of 80% on room air.  Patient down to 2 L and will check pulse ox on room air.  We will also get an overnight oximetry. Influenza A with pneumonia, sepsis present on admission.  Patient completed course of Tamiflu Obesity hypoventilation syndrome.  Patient was on BiPAP initially and weaned off BiPAP.  Outpatient sleep study needed Cellulitis and abscess of buttock.  Completed antibiotic with Augmentin Chronic diastolic congestive heart failure on torsemide twice daily Essential hypertension.  Blood pressure stable on torsemide twice daily Morbid obesity BMI 60.99 Depression on Zoloft Hypokalemia replace potassium daily     Code Status:     Code Status Orders  (From admission, onward)           Start     Ordered   01/08/21 2330  Full code  Continuous        01/08/21 2330           Code Status History     Date Active Date Inactive Code Status Order ID Comments User Context   10/09/2020 1236 12/01/2020 0021 Full Code [id number]  Cox, Amy Dorris Carnes,  DO ED      Disposition Plan: Status is: Inpatient.  Notified that homeless shelter will not be able to take the patient back with medical issues.  Transitional care team trying to work on things.  Time spent: 28 minutes  Darci Lykins [employer]

## 2021-01-25 DIAGNOSIS — E662 Morbid (severe) obesity with alveolar hypoventilation: Secondary | ICD-10-CM

## 2021-01-25 DIAGNOSIS — Z5900 Homelessness unspecified: Secondary | ICD-10-CM

## 2021-01-25 DIAGNOSIS — Z6841 Body Mass Index (BMI) 40.0 and over, adult: Secondary | ICD-10-CM

## 2021-01-25 LAB — BASIC METABOLIC PANEL
Anion gap: 6 (ref 5–15)
BUN: 17 mg/dL (ref 6–20)
CO2: 34 mmol/L — ABNORMAL HIGH (ref 22–32)
Calcium: 8.6 mg/dL — ABNORMAL LOW (ref 8.9–10.3)
Chloride: 99 mmol/L (ref 98–111)
Creatinine, Ser: 0.88 mg/dL (ref 0.61–1.24)
GFR, Estimated: >60 mL/min (ref >60)
Glucose, Bld: 99 mg/dL (ref 70–99)
Potassium: 3.4 mmol/L — ABNORMAL LOW (ref 3.5–5.1)
Sodium: 139 mmol/L (ref 135–145)

## 2021-01-25 LAB — MAGNESIUM: Magnesium: 2.2 mg/dL (ref 1.7–2.4)

## 2021-01-25 NOTE — Progress Notes (Signed)
Physical Therapy Treatment Patient Details Name: Christian [name] MRN: [id number] DOB: Apr 22, 1975 Today's Date: 01/25/2021   History of Present Illness Pt is a 45 y/o M admitted on 01/08/21 with c/c of SOB. Pt noted to be positive for Influenza A. Pt noted to have cellulitic area to R buttock. Imaging reveals multifocal PNA in the R lung. PMH: morbid obesity, HTN    PT Comments    Patient A&Ox4, very motivated to participate with therapy, up in chair at start of session. Shoes donned prior to ambulation, maxA, pt able to doff independently at EOB. Endorsed feeling like his breathing is better less "junky" with coughing. He was able to perform transfers with supervision, bed mobility modI, and ambulate ~376ft with RW and supervision. 2 PT led rest breaks to ensure spO2 >87% on room air. Returned to bed with all needs in reach. The patient continues to demonstrate improvement in mobility, recommendation remains appropriate.   Recommendations for follow up therapy are one component of a multi-disciplinary discharge planning process, led by the attending physician.  Recommendations may be updated based on patient status, additional functional criteria and insurance authorization.  Follow Up Recommendations  Outpatient PT     Assistance Recommended at Discharge Intermittent Supervision/Assistance  Equipment Recommendations  None recommended by PT    Recommendations for Other Services OT consult     Precautions / Restrictions Precautions Precautions: Fall Restrictions Weight Bearing Restrictions: No     Mobility  Bed Mobility Overal bed mobility: Modified Independent                  Transfers Overall transfer level: Needs assistance Equipment used: Rolling walker (2 wheels) Transfers: Sit to/from Stand Sit to Stand: Supervision                Ambulation/Gait Ambulation/Gait assistance: Supervision Gait Distance (Feet): 300 Feet Assistive device: Rolling walker  (2 wheels) Gait Pattern/deviations: Step-through pattern Gait velocity: decreased     General Gait Details: 2 PT led standing rest breaks to ensure spO2 >87%   Stairs             Wheelchair Mobility    Modified Rankin (Stroke Patients Only)       Balance Overall balance assessment: Needs assistance Sitting-balance support: Feet supported Sitting balance-[name]: Normal     Standing balance support: Bilateral upper extremity supported;During functional activity;No upper extremity supported Standing balance-[name]: Good Standing balance comment: no LOB with UE support                            Cognition Arousal/Alertness: Awake/alert Behavior During Therapy: WFL for tasks assessed/performed Overall Cognitive Status: Within Functional Limits for tasks assessed                                          Exercises      General Comments General comments (skin integrity, edema, etc.): on room air throughout      Pertinent Vitals/Pain Pain Assessment: No/denies pain    Home Living                          Prior Function            PT Goals (current goals can now be found in the care plan section) Progress towards PT goals: Progressing  toward goals    Frequency    Min 2X/week      PT Plan Current plan remains appropriate    Co-evaluation              AM-PAC PT "6 Clicks" Mobility   Outcome Measure  Help needed turning from your back to your side while in a flat bed without using bedrails?: A Little Help needed moving from lying on your back to sitting on the side of a flat bed without using bedrails?: A Little Help needed moving to and from a bed to a chair (including a wheelchair)?: A Little Help needed standing up from a chair using your arms (e.g., wheelchair or bedside chair)?: A Little Help needed to walk in hospital room?: A Little Help needed climbing 3-5 steps with a railing? : A  Little 6 Click Score: 18    End of Session Equipment Utilized During Treatment: Gait belt Activity Tolerance: Patient tolerated treatment well Patient left: in bed;with call bell/phone within reach Nurse Communication: Mobility status PT Visit Diagnosis: Muscle weakness (generalized) (M62.81)     Time: 3149-7026 PT Time Calculation (min) (ACUTE ONLY): 27 min  Charges:  $Therapeutic Exercise: 23-37 mins                     Olga Coaster PT, DPT 3:47 PM,01/25/21

## 2021-01-25 NOTE — Progress Notes (Signed)
Patient ID: Christian [name], male   DOB: 06/01/1975, 45 y.o.   MRN: [id number] [facility] PROGRESS NOTE  Seaton Hofmann [id number]:[id number] DOB: Aug 10, 1975 DOA: 01/08/2021 PCP: Pcp, No  HPI/Subjective: Patient feeling that his breathing is better today.  Still has a little cough.  Off oxygen during the day.  Admitted with acute hypoxic respiratory failure and influenza A pneumonia.  Objective: Vitals:   01/25/21 0801 01/25/21 1135  BP: 110/71 (!) 120/99  Pulse: 84 80  Resp: 18 18  Temp: 98.7 F (37.1 C) 98.5 F (36.9 C)  SpO2: 91% 93%    Intake/Output Summary (Last 24 hours) at 01/25/2021 1434 Last data filed at 01/25/2021 1348 Gross per 24 hour  Intake 960 ml  Output 2100 ml  Net -1140 ml   Filed Weights   01/08/21 1759 01/25/21 1348  Weight: (!) 209.7 kg (!) 196 kg    ROS: Review of Systems  Respiratory:  Positive for cough. Negative for shortness of breath.   Cardiovascular:  Negative for chest pain.  Gastrointestinal:  Negative for abdominal pain, nausea and vomiting.  Exam: Physical Exam HENT:     Head: Normocephalic.     Mouth/Throat:     Pharynx: No oropharyngeal exudate.  Eyes:     General: Lids are normal.     Conjunctiva/sclera: Conjunctivae normal.  Cardiovascular:     Rate and Rhythm: Normal rate and regular rhythm.     Heart sounds: Normal heart sounds, S1 normal and S2 normal.  Pulmonary:     Breath sounds: Examination of the right-lower field reveals decreased breath sounds. Examination of the left-lower field reveals decreased breath sounds. Decreased breath sounds present. No wheezing, rhonchi or rales.  Abdominal:     Palpations: Abdomen is soft.     Tenderness: There is no abdominal tenderness.  Musculoskeletal:     Right lower leg: Swelling present.     Left lower leg: Swelling present.  Skin:    General: Skin is warm.     Comments: Chronic lower extremity skin discoloration  Neurological:     Mental Status: He is alert and  oriented to person, place, and time.      Scheduled Meds:  docusate sodium  100 mg Oral BID   enoxaparin (LOVENOX) injection  0.5 mg/kg Subcutaneous Q24H   fluticasone  2 spray Each Nare Daily   guaiFENesin  15 mL Oral QID   ipratropium-albuterol  3 mL Nebulization BID   potassium chloride  40 mEq Oral Daily   sertraline  50 mg Oral Daily   torsemide  20 mg Oral BID    Assessment/Plan:  Acute hypoxic respiratory failure.  Patient initially had a pulse ox of 80% on room air.  Now off oxygen during the day.  Still awaiting the overnight oximetry results not seen in the chart earlier. Influenza annually pneumonia, sepsis present on admission.  Patient completed course of Tamiflu improving. Obesity hypoventilation syndrome.  Patient was on BiPAP initially weaned off BiPAP.  Will need an outpatient sleep study. Cellulitis and abscess of the buttock.  Completed antibiotic course with Augmentin Chronic diastolic congestive heart failure on torsemide twice a day Essential hypertension.  Blood pressure stable on torsemide twice a day Morbid obesity.  BMI 57.02. Depression on Zoloft Hypokalemia on daily potassium supplementation Homeless and shelter will not take him back       Code Status:     Code Status Orders  (From admission, onward)  Start     Ordered   01/08/21 2330  Full code  Continuous        01/08/21 2330           Code Status History     Date Active Date Inactive Code Status Order ID Comments User Context   10/09/2020 1236 12/01/2020 0021 Full Code [id number]  Cox, Nadyne Coombes, DO ED       Disposition Plan: Status is: Inpatient.  Medically stable for discharge but no place to go.  Transitional care team trying to work on things  Time spent: 26 minutes  Abigale Dorow [employer]

## 2021-01-25 NOTE — Plan of Care (Signed)
  Problem: Activity: Goal: Risk for activity intolerance will decrease Outcome: Progressing   Problem: Skin Integrity: Goal: Risk for impaired skin integrity will decrease Outcome: Not Progressing   Continues with wound to right posterior thigh.

## 2021-01-25 NOTE — Plan of Care (Signed)
  Problem: Activity: Goal: Risk for activity intolerance will decrease Outcome: Progressing   Problem: Skin Integrity: Goal: Risk for impaired skin integrity will decrease Outcome: Not Progressing

## 2021-01-26 ENCOUNTER — Ambulatory Visit: Payer: Medicaid Other | Admitting: Gerontology

## 2021-01-26 DIAGNOSIS — G4734 Idiopathic sleep related nonobstructive alveolar hypoventilation: Secondary | ICD-10-CM

## 2021-01-26 LAB — BASIC METABOLIC PANEL
Anion gap: 8 (ref 5–15)
BUN: 16 mg/dL (ref 6–20)
CO2: 31 mmol/L (ref 22–32)
Calcium: 8.5 mg/dL — ABNORMAL LOW (ref 8.9–10.3)
Chloride: 98 mmol/L (ref 98–111)
Creatinine, Ser: 0.84 mg/dL (ref 0.61–1.24)
GFR, Estimated: >60 mL/min (ref >60)
Glucose, Bld: 86 mg/dL (ref 70–99)
Potassium: 3.5 mmol/L (ref 3.5–5.1)
Sodium: 137 mmol/L (ref 135–145)

## 2021-01-26 LAB — MAGNESIUM: Magnesium: 2.2 mg/dL (ref 1.7–2.4)

## 2021-01-26 NOTE — Plan of Care (Signed)
  Problem: Activity: Goal: Risk for activity intolerance will decrease 01/26/2021 1009 by Ansel Bong, RN Outcome: Progressing 01/26/2021 1009 by Ansel Bong, RN Outcome: Progressing   Problem: Skin Integrity: Goal: Risk for impaired skin integrity will decrease 01/26/2021 1009 by Ansel Bong, RN Outcome: Progressing 01/26/2021 1009 by Ansel Bong, RN Outcome: Progressing

## 2021-01-26 NOTE — TOC Progression Note (Addendum)
Transition of Care [facility]) - Progression Note    Patient Details  Name: Christian [name] MRN: [id number] Date of Birth: Oct 01, 1975  [facility] CM/SW Contact  Maree Krabbe, LCSW Phone Number: 01/26/2021, 1:44 PM  Clinical Narrative:   Per MD in rounds pt qualifies for NIV per overnight oximetry. Referral given to zach with [employer].  Per Ian Malkin with [employer] Pt will need a roof over his head and a continuous electrical outlet assess for dc for them to be able to set up the NIV. At this time disposition is undetermined.  Unfortunately pt can not go to shelter with BiPAP or NIV. Pt's disability is pending. Pt's dispo undetermined. Pt is on DTP list.  Expected Discharge Plan: Homeless Shelter Barriers to Discharge: Continued Medical Work up  Expected Discharge Plan and Services Expected Discharge Plan: Homeless Shelter In-house Referral: Clinical Social Work   Post Acute Care Choice: NA Living arrangements for the past 2 months: Homeless Shelter Expected Discharge Date: 01/18/21                                     Social Determinants of Health (SDOH) Interventions    Readmission Risk Interventions Readmission Risk Prevention Plan 10/11/2020  Post Dischage Appt Complete  Medication Screening Complete  Transportation Screening Complete  Some recent data might be hidden

## 2021-01-26 NOTE — Progress Notes (Signed)
Occupational Therapy Treatment Patient Details Name: Christian [name] MRN: [id number] DOB: April 06, 1975 Today's Date: 01/26/2021   History of present illness Pt is a 45 y/o M admitted on 01/08/21 with c/c of SOB. Pt noted to be positive for Influenza A. Pt noted to have cellulitic area to R buttock. Imaging reveals multifocal PNA in the R lung. PMH: morbid obesity, HTN   OT comments  Chart reviewed, pt greeted in chair agreeable to OT tx session. Tx session targeted improving strength/endurance for increased ease and safety during ADL completion. Pt completes grooming tasks in standing at sink level with close supervision. Pt completes LB dressing with supervision in sitting- CGA for dynamic standing to pull tab out from heels. Pt completes STS 10x with close sup, static standing 30 seconds, then 1 minute with close sup, dynamic standing tasks (reaching overhead) 10x each side with CGA in preparation for functional transfers, ADL task completion. Good tolerance for graded up tasks on this date. Pt is making progress towards goals, will continue to benefit from OT to address functional deficits. Pt is left as received, NAD, all needs met. OT will continue to follow.   Recommendations for follow up therapy are one component of a multi-disciplinary discharge planning process, led by the attending physician.  Recommendations may be updated based on patient status, additional functional criteria and insurance authorization.    Follow Up Recommendations  Home health OT    Assistance Recommended at Discharge Intermittent Supervision/Assistance  Equipment Recommendations  BSC/3in1;Tub/shower seat    Recommendations for Other Services      Precautions / Restrictions Precautions Precautions: Fall Restrictions Weight Bearing Restrictions: No       Mobility Bed Mobility               General bed mobility comments: pt was in recliner pre/post session    Transfers Overall transfer level:  Needs assistance Equipment used: Rolling walker (2 wheels) Transfers: Sit to/from Stand;Bed to chair/wheelchair/BSC Sit to Stand: Supervision   Step pivot transfers: Supervision       General transfer comment: STS 10x with close sup, static standing 30 seconds, then 1 minute with close sup, dynamic standing tasks (reaching overhead) 10x each side with CGA.     Balance Overall balance assessment: Needs assistance Sitting-balance support: Feet supported Sitting balance-[name]: Normal     Standing balance support: During functional activity;No upper extremity supported Standing balance-[name]: Good                             ADL either performed or assessed with clinical judgement   ADL Overall ADL's : Needs assistance/impaired                                     Functional mobility during ADLs: Supervision/safety;Rolling walker (2 wheels) General ADL Comments: CLOSE SUP with bari RW for oral care at sink level; CGA for donning shoes in sit>stand to pull tab out from back of shoe;    Extremity/Trunk Assessment              Vision Baseline Vision/History: 1 Wears glasses Patient Visual Report: No change from baseline     Perception     Praxis      Cognition Arousal/Alertness: Awake/alert Behavior During Therapy: WFL for tasks assessed/performed Overall Cognitive Status: Within Functional Limits for tasks assessed  General Comments: Pt is alert and oriented x4          Exercises Other Exercises Other Exercises: education re: mobility, LB dressing with compensatory techniques, static/dynamic standing tasks in preparation for ADL completion   Shoulder Instructions       General Comments Spo2 from 89-94 throughout tx session    Pertinent Vitals/ Pain       Pain Assessment: No/denies pain  Home Living                                          Prior  Functioning/Environment              Frequency  Min 2X/week        Progress Toward Goals  OT Goals(current goals can now be found in the care plan section)  Progress towards OT goals: Progressing toward goals  Acute Rehab OT Goals Patient Stated Goal: get stronger OT Goal Formulation: With patient Time For Goal Achievement: 02/09/21 Potential to Achieve Goals: Good  Plan Discharge plan remains appropriate    Co-evaluation                 AM-PAC OT "6 Clicks" Daily Activity     Outcome Measure   Help from another person eating meals?: None Help from another person taking care of personal grooming?: A Little Help from another person toileting, which includes using toliet, bedpan, or urinal?: A Little Help from another person bathing (including washing, rinsing, drying)?: A Little Help from another person to put on and taking off regular upper body clothing?: A Little Help from another person to put on and taking off regular lower body clothing?: A Little 6 Click Score: 19    End of Session Equipment Utilized During Treatment: Rolling walker (2 wheels)  OT Visit Diagnosis: Unsteadiness on feet (R26.81);Muscle weakness (generalized) (M62.81)   Activity Tolerance Patient tolerated treatment well   Patient Left in chair;with call bell/phone within reach;with chair alarm set   Nurse Communication Mobility status        Time: 1126-1150 OT Time Calculation (min): 24 min  Charges: OT General Charges $OT Visit: 1 Visit OT Treatments $Self Care/Home Management : 8-22 mins $Therapeutic Activity: 8-22 mins  Shanon Payor, OTD OTR/L  01/26/21, 12:09 PM

## 2021-01-26 NOTE — Progress Notes (Signed)
Patient ID: Christian [name], male   DOB: Jul 24, 1975, 45 y.o.   MRN: [id number] [facility] PROGRESS NOTE  Davionne Dowty EVO:[id number] DOB: [birth year] DOA: 01/08/2021 PCP: Pcp, No  HPI/Subjective: Patient feeling okay.  Little bit of a hoarse voice this morning and a little cough last night.  Initially admitted with influenza A pneumonia and acute hypoxic respiratory failure.  Objective: Vitals:   01/26/21 1129 01/26/21 1150  BP: 129/78   Pulse: 81   Resp: 18   Temp: 98.3 F (36.8 C)   SpO2: 92% 92%    Intake/Output Summary (Last 24 hours) at 01/26/2021 1231 Last data filed at 01/26/2021 1132 Gross per 24 hour  Intake 720 ml  Output 2425 ml  Net -1705 ml   Filed Weights   01/08/21 1759 01/25/21 1348  Weight: (!) 209.7 kg (!) 196 kg    ROS: Review of Systems  Respiratory:  Positive for cough.   Cardiovascular:  Negative for chest pain.  Gastrointestinal:  Negative for abdominal pain, nausea and vomiting.  Exam: Physical Exam HENT:     Head: Normocephalic.     Mouth/Throat:     Pharynx: No oropharyngeal exudate.  Eyes:     General: Lids are normal.     Conjunctiva/sclera: Conjunctivae normal.  Cardiovascular:     Rate and Rhythm: Normal rate and regular rhythm.     Heart sounds: Normal heart sounds, S1 normal and S2 normal.  Pulmonary:     Breath sounds: Examination of the right-lower field reveals decreased breath sounds. Examination of the left-lower field reveals decreased breath sounds. Decreased breath sounds present. No wheezing, rhonchi or rales.  Abdominal:     Palpations: Abdomen is soft.     Tenderness: There is no abdominal tenderness.  Musculoskeletal:     Right lower leg: Swelling present.     Left lower leg: Swelling present.  Skin:    General: Skin is warm.     Findings: No rash.  Neurological:     Mental Status: He is alert and oriented to person, place, and time.      Scheduled Meds:  docusate sodium  100 mg Oral BID    enoxaparin (LOVENOX) injection  0.5 mg/kg Subcutaneous Q24H   fluticasone  2 spray Each Nare Daily   guaiFENesin  15 mL Oral QID   potassium chloride  40 mEq Oral Daily   sertraline  50 mg Oral Daily   torsemide  20 mg Oral BID   Brief history: 45 year old man with morbid obesity and recent prolonged hospitalization complicated with perforated bowel status post operative repair, depression.  He presented with acute hypoxic respiratory failure and found to have influenza A pneumonia.  Admitted 17 days ago.  Assessment/Plan:  Acute hypoxic respiratory failure.  Initially had pulse ox of 80% on room air.  This has now resolved.  Patient off oxygen during the day. Nocturnal hypoxia.  Overnight oximetry results in paper chart.  The patient qualifies for nocturnal oxygen.  Oxygen at night ordered. Influenza A pneumonia, sepsis present on admission.  Patient completed course of Tamiflu.  Respiratory status improved. Obesity hypoventilation syndrome.  Patient was on BiPAP initially and weaned off BiPAP.  Patient will need an outpatient sleep study. Cellulitis and abscess of buttock.  Completed antibiotic course with Augmentin. Chronic diastolic congestive heart failure.  Continue torsemide twice a day. Essential hypertension.  Blood pressure stable on torsemide Morbid obesity with a BMI of 57.0 to Depression on Zoloft Hypokalemia.  Daily potassium supplementation  Homelessness.  As per transitional care team homeless shelter will not take him back.   Code Status:     Code Status Orders  (From admission, onward)           Start     Ordered   01/08/21 2330  Full code  Continuous        01/08/21 2330           Code Status History     Date Active Date Inactive Code Status Order ID Comments User Context   10/09/2020 1236 12/01/2020 0021 Full Code [id number]  Cox, Nadyne Coombes, DO ED      Disposition Plan: Status is: Inpatient.  Transitional care team trying to work on options for this  patient.  Time spent: 27 minutes  Gabbriella [employer]

## 2021-01-26 NOTE — TOC Progression Note (Signed)
Transition of Care [facility]) - Progression Note    Patient Details  Name: Christian [name] MRN: [id number] Date of Birth: 1975/08/25  Transition of [facility]) CM/SW Contact  Maree Krabbe, LCSW Phone Number: 01/26/2021, 10:51 AM  Clinical Narrative:   Per Rounds MD ordered a overnight oximetry test.    Expected Discharge Plan: Homeless Shelter Barriers to Discharge: Continued Medical Work up  Expected Discharge Plan and Services Expected Discharge Plan: Homeless Shelter In-house Referral: Clinical Social Work   Post Acute Care Choice: NA Living arrangements for the past 2 months: Homeless Shelter Expected Discharge Date: 01/18/21                                     Social Determinants of Health (SDOH) Interventions    Readmission Risk Interventions Readmission Risk Prevention Plan 10/11/2020  Post Dischage Appt Complete  Medication Screening Complete  Transportation Screening Complete  Some recent data might be hidden

## 2021-01-27 LAB — CBC
HCT: 39.2 % (ref 39.0–52.0)
Hemoglobin: 12.2 g/dL — ABNORMAL LOW (ref 13.0–17.0)
MCH: 24.9 pg — ABNORMAL LOW (ref 26.0–34.0)
MCHC: 31.1 g/dL (ref 30.0–36.0)
MCV: 80 fL (ref 80.0–100.0)
Platelets: 374 10*3/uL (ref 150–400)
RBC: 4.9 MIL/uL (ref 4.22–5.81)
RDW: 15.1 % (ref 11.5–15.5)
WBC: 8 10*3/uL (ref 4.0–10.5)
nRBC: 0 % (ref 0.0–0.2)

## 2021-01-27 LAB — BASIC METABOLIC PANEL
Anion gap: 7 (ref 5–15)
BUN: 14 mg/dL (ref 6–20)
CO2: 32 mmol/L (ref 22–32)
Calcium: 8.5 mg/dL — ABNORMAL LOW (ref 8.9–10.3)
Chloride: 97 mmol/L — ABNORMAL LOW (ref 98–111)
Creatinine, Ser: 0.84 mg/dL (ref 0.61–1.24)
GFR, Estimated: >60 mL/min (ref >60)
Glucose, Bld: 102 mg/dL — ABNORMAL HIGH (ref 70–99)
Potassium: 3.3 mmol/L — ABNORMAL LOW (ref 3.5–5.1)
Sodium: 136 mmol/L (ref 135–145)

## 2021-01-27 NOTE — Progress Notes (Signed)
PROGRESS NOTE    Christian [name]   YJE:[id number]  DOB: Mar 28, 1975  PCP: Pcp, No    DOA: 01/08/2021 LOS: 19    Brief Narrative / Hospital Course to Date:   45 year old man with morbid obesity and recent prolonged hospitalization complicated with perforated bowel status post operative repair, depression.  He presented with acute hypoxic respiratory failure and found to have influenza A pneumonia.    Long length of stay, admitted on 01/08/2021.   Difficult disposition due to homelessness, unable to return to shelter, disability application pending.  Assessment & Plan   Principal Problem:   Influenza A with pneumonia Active Problems:   Morbid obesity with BMI of 50.0-59.9, adult (HCC)   Pressure ulcer of buttock   Essential hypertension   Acute respiratory failure with hypoxemia (HCC)   Obesity hypoventilation syndrome (HCC)   Chronic diastolic CHF (congestive heart failure) (HCC)   Cellulitis and abscess of buttock   Prediabetes   Homelessness   Hypokalemia   Sepsis without acute organ dysfunction (HCC)   Nocturnal hypoxia   Acute respiratory failure with hypoxia -present on admission, resolved.  Initial pulse ox was 80% on room air, now weaned off oxygen during the day.  Nocturnal hypoxia -see overnight oximetry results in paper chart.  Patient qualifies for nocturnal oxygen.  Supplement O2 overnight.  Influenza A pneumonia with sepsis -present on admission, resolved.  Completed course of Tamiflu.  Respiratory status improved.  Obesity hypoventilation syndrome -patient initially required BiPAP, has been weaned off.  Needs outpatient sleep study after discharge.  Cellulitis with abscess of buttock -resolved.  Completed course of Augmentin.  Chronic diastolic CHF -stable volume status.  Continue torsemide twice daily.  Hypokalemia -likely due to torsemide.  On daily potassium supplementation.  Monitor BMP occasionally, daily labs not required at this  point.  Essential hypertension -BP stable on torsemide, continue  Depression -continue Zoloft  Homelessness -Per TOC, homeless shelter not able to take the patient back.   Morbid obesity: Body mass index is 57.02 kg/m.  Complicates overall care and prognosis.  Recommend lifestyle modifications including physical activity and diet for weight loss and overall long-term health.   DVT prophylaxis: SCDs Start: 01/08/21 2330   Diet:  Diet Orders (From admission, onward)     Start     Ordered   01/18/21 0000  Diet - low sodium heart healthy        01/18/21 1306   01/14/21 0748  Diet Heart Room service appropriate? Yes; Fluid consistency: Thin  Diet effective now       Question Answer Comment  Room service appropriate? Yes   Fluid consistency: Thin      01/14/21 0747              Code Status: Full Code   Subjective 01/27/21    Patient up in recliner when seen on rounds today.  He reports feeling fine.  Sleeping well with nasal cannula oxygen at night.  Denies shortness of breath, fevers chills, nausea vomiting or any other complaints.   Disposition Plan & Communication   Status is: Inpatient  Remains inpatient appropriate because: Homelessness, unsafe discharge, requires oxygen supplementation and unable to return to shelter.  TOC working on placement options.   Consults, Procedures, Significant Events   Consultants:    Procedures:  None  Antimicrobials:  Anti-infectives (From admission, onward)    Start     Dose/Rate Route Frequency Ordered Stop   01/11/21 2200  oseltamivir (TAMIFLU) capsule 75  mg        75 mg Oral 2 times daily 01/11/21 1317 01/13/21 1041   01/10/21 1030  amoxicillin-clavulanate (AUGMENTIN) 875-125 MG per tablet 1 tablet        1 tablet Oral Every 12 hours 01/10/21 0943 01/14/21 2054   01/09/21 1000  metroNIDAZOLE (FLAGYL) IVPB 500 mg  Status:  Discontinued        500 mg 100 mL/hr over 60 Minutes Intravenous Every 12 hours 01/08/21 2330  01/09/21 0813   01/09/21 1000  oseltamivir (TAMIFLU) capsule 75 mg  Status:  Discontinued        75 mg Oral 2 times daily 01/08/21 2330 01/11/21 1317   01/09/21 0815  ceFAZolin (ANCEF) IVPB 2g/100 mL premix  Status:  Discontinued        2 g 200 mL/hr over 30 Minutes Intravenous Every 8 hours 01/09/21 0809 01/10/21 0943   01/09/21 0800  ceFEPIme (MAXIPIME) 2 g in sodium chloride 0.9 % 100 mL IVPB  Status:  Discontinued        2 g 200 mL/hr over 30 Minutes Intravenous Every 8 hours 01/09/21 0655 01/09/21 0813   01/09/21 0700  vancomycin (VANCOREADY) IVPB 2000 mg/400 mL  Status:  Discontinued        2,000 mg 200 mL/hr over 120 Minutes Intravenous Every 12 hours 01/09/21 0045 01/09/21 0813   01/09/21 0030  vancomycin (VANCOREADY) IVPB 1500 mg/300 mL        1,500 mg 150 mL/hr over 120 Minutes Intravenous  Once 01/09/21 0029 01/09/21 0230   01/08/21 2345  ceFEPIme (MAXIPIME) 2 g in sodium chloride 0.9 % 100 mL IVPB  Status:  Discontinued        2 g 200 mL/hr over 30 Minutes Intravenous Every 24 hours 01/08/21 2330 01/09/21 0655   01/08/21 1915  oseltamivir (TAMIFLU) capsule 75 mg  Status:  Discontinued        75 mg Oral 2 times daily 01/08/21 1910 01/08/21 2330   01/08/21 1815  ceFEPIme (MAXIPIME) 2 g in sodium chloride 0.9 % 100 mL IVPB        2 g 200 mL/hr over 30 Minutes Intravenous  Once 01/08/21 1809 01/08/21 1902   01/08/21 1815  metroNIDAZOLE (FLAGYL) IVPB 500 mg        500 mg 100 mL/hr over 60 Minutes Intravenous  Once 01/08/21 1809 01/08/21 1930   01/08/21 1815  vancomycin (VANCOCIN) IVPB 1000 mg/200 mL premix        1,000 mg 200 mL/hr over 60 Minutes Intravenous  Once 01/08/21 1809 01/08/21 2006         Micro    Objective   Vitals:   01/27/21 0052 01/27/21 0436 01/27/21 0819 01/27/21 1115  BP: 132/82 118/71 (!) 137/92 133/80  Pulse: 76 76 74 76  Resp: 18 14 18 18   Temp: 97.8 F (36.6 C) 97.8 F (36.6 C) 97.7 F (36.5 C) 98.1 F (36.7 C)  TempSrc: Oral     SpO2:  95% 94% 94% 95%  Weight:      Height:        Intake/Output Summary (Last 24 hours) at 01/27/2021 1523 Last data filed at 01/27/2021 1405 Gross per 24 hour  Intake 1380 ml  Output 2350 ml  Net -970 ml   Filed Weights   01/08/21 1759 01/25/21 1348  Weight: (!) 209.7 kg (!) 196 kg    Physical Exam:  General exam: awake, alert, no acute distress, morbidly obese HEENT: moist mucus membranes, hearing  grossly normal  Respiratory system: CTAB but diminished due to body habitus, no wheezes, rales or rhonchi, normal respiratory effort. Cardiovascular system: normal S1/S2, RRRno pedal edema.   Gastrointestinal system: soft, nontender abdomen. Central nervous system: A&O x3. no gross focal neurologic deficits, normal speech Extremities: moves all, no edema, normal tone Psychiatry: normal mood, congruent affect, judgement and insight appear normal  Labs   Data Reviewed: I have personally reviewed following labs and imaging studies  CBC: Recent Labs  Lab 01/27/21 0420  WBC 8.0  HGB 12.2*  HCT 39.2  MCV 80.0  PLT 374   Basic Metabolic Panel: Recent Labs  Lab 01/23/21 0426 01/24/21 0319 01/25/21 0430 01/26/21 0245 01/27/21 0420  NA 138 139 139 137 136  K 3.1* 3.4* 3.4* 3.5 3.3*  CL 98 100 99 98 97*  CO2 31 33* 34* 31 32  GLUCOSE 95 101* 99 86 102*  BUN 14 15 17 16 14   CREATININE 0.79 0.87 0.88 0.84 0.84  CALCIUM 8.6* 8.6* 8.6* 8.5* 8.5*  MG 2.3 2.2 2.2 2.2  --    GFR: Estimated Creatinine Clearance: 196.3 mL/min (by C-G formula based on SCr of 0.84 mg/dL). Liver Function Tests: No results for input(s): AST, ALT, ALKPHOS, BILITOT, PROT, ALBUMIN in the last 168 hours. No results for input(s): LIPASE, AMYLASE in the last 168 hours. No results for input(s): AMMONIA in the last 168 hours. Coagulation Profile: No results for input(s): INR, PROTIME in the last 168 hours. Cardiac Enzymes: No results for input(s): CKTOTAL, CKMB, CKMBINDEX, TROPONINI in the last 168  hours. BNP (last 3 results) No results for input(s): PROBNP in the last 8760 hours. HbA1C: No results for input(s): HGBA1C in the last 72 hours. CBG: No results for input(s): GLUCAP in the last 168 hours. Lipid Profile: No results for input(s): CHOL, HDL, LDLCALC, TRIG, CHOLHDL, LDLDIRECT in the last 72 hours. Thyroid Function Tests: No results for input(s): TSH, T4TOTAL, FREET4, T3FREE, THYROIDAB in the last 72 hours. Anemia Panel: No results for input(s): VITAMINB12, FOLATE, FERRITIN, TIBC, IRON, RETICCTPCT in the last 72 hours. Sepsis Labs: No results for input(s): PROCALCITON, LATICACIDVEN in the last 168 hours.  No results found for this or any previous visit (from the past 240 hour(s)).    Imaging Studies   No results found.   Medications   Scheduled Meds:  docusate sodium  100 mg Oral BID   enoxaparin (LOVENOX) injection  0.5 mg/kg Subcutaneous Q24H   fluticasone  2 spray Each Nare Daily   guaiFENesin  15 mL Oral QID   potassium chloride  40 mEq Oral Daily   sertraline  50 mg Oral Daily   torsemide  20 mg Oral BID   Continuous Infusions:     LOS: 19 days    Time spent: 30 minutes    , DO [facility]  01/27/2021, 3:23 PM      If 7PM-7AM, please contact night-coverage. How to contact the [facility] Attending or Consulting provider 7A - 7P or covering provider during after hours 7P -7A, for this patient?    Check the care team in [facility] and look for a) attending/consulting TR[facility] provider listed and b) the [facility] team listed Log into [email or web address] and use [place]'s universal password to access. If you do not have the password, please contact the hospital operator. Locate the [facility] provider you are looking for under [facility] and page to a number that you can be directly reached. If you still have difficulty reaching  the provider, please page the [facility] (Director on Call) for the Hospitalists listed on amion for assistance.

## 2021-01-27 NOTE — Progress Notes (Signed)
Physical Therapy Treatment Patient Details Name: Christian [name] MRN: [id number] DOB: 05/17/1975 Today's Date: 01/27/2021   History of Present Illness Pt is a 45 y/o M admitted on 01/08/21 with c/c of SOB. Pt noted to be positive for Influenza A. Pt noted to have cellulitic area to R buttock. Imaging reveals multifocal PNA in the R lung. PMH: morbid obesity, HTN    PT Comments    Pt was sitting in recliner upon arriving on rm air. Sao2 91%. Required assistance to apply/donn shoes. He stood and ambulated 400 ft with bariatric RW + several standing rest. Sao2 remained > 91% throughout. Pt slightly SOB but was too fatigued to attempt stairs. Will address stairs in next session. Overall pt continues to demonstrate improving activity tolerance and strength. He will benefit from continued skilled PT to address deficits while improving independence with ADLs.    Recommendations for follow up therapy are one component of a multi-disciplinary discharge planning process, led by the attending physician.  Recommendations may be updated based on patient status, additional functional criteria and insurance authorization.  Follow Up Recommendations  Outpatient PT (any/all follow up therapy he can have will be benificial)     Assistance Recommended at Discharge Intermittent Supervision/Assistance  Equipment Recommendations  None recommended by PT       Precautions / Restrictions Precautions Precautions: Fall Restrictions Weight Bearing Restrictions: No     Mobility  Bed Mobility    General bed mobility comments: in recliner pre/post session    Transfers Overall transfer level: Modified independent Equipment used: Rolling walker (2 wheels) Transfers: Sit to/from Stand Sit to Stand: Supervision           General transfer comment: no physical asisstance to stand or sit    Ambulation/Gait Ambulation/Gait assistance: Supervision Gait Distance (Feet): 400 Feet Assistive device:  Rolling walker (2 wheels) Gait Pattern/deviations: Step-through pattern Gait velocity: decreased     General Gait Details: pt ambulated 400 ft with RW + 2 standing rest (about 30-45 sec each). He was on rm air throughout with sao2 >91 % throughout    Balance Overall balance assessment: Needs assistance Sitting-balance support: Feet supported Sitting balance-[name]: Normal     Standing balance support: During functional activity;No upper extremity supported Standing balance-[name]: Good       Cognition Arousal/Alertness: Awake/alert Behavior During Therapy: WFL for tasks assessed/performed Overall Cognitive Status: Within Functional Limits for tasks assessed      General Comments: Pt is alert and oriented x4           General Comments General comments (skin integrity, edema, etc.): Reviewed importance of continueing to perform ther ex throughout the day to promote strengthening and return in function      Pertinent Vitals/Pain Pain Assessment: No/denies pain     PT Goals (current goals can now be found in the care plan section) Acute Rehab PT Goals Patient Stated Goal: none stated Progress towards PT goals: Progressing toward goals    Frequency    Min 2X/week      PT Plan Current plan remains appropriate       AM-PAC PT "6 Clicks" Mobility   Outcome Measure  Help needed turning from your back to your side while in a flat bed without using bedrails?: A Little Help needed moving from lying on your back to sitting on the side of a flat bed without using bedrails?: A Little Help needed moving to and from a bed to a chair (including a wheelchair)?:  A Little Help needed standing up from a chair using your arms (e.g., wheelchair or bedside chair)?: A Little Help needed to walk in hospital room?: A Little Help needed climbing 3-5 steps with a railing? : A Little 6 Click Score: 18    End of Session Equipment Utilized During Treatment: Gait  belt Activity Tolerance: Patient tolerated treatment well Patient left: in chair;with call bell/phone within reach;with chair alarm set Nurse Communication: Mobility status PT Visit Diagnosis: Muscle weakness (generalized) (M62.81)     Time: 2563-8937 PT Time Calculation (min) (ACUTE ONLY): 25 min  Charges:  $Gait Training: 8-22 mins $Therapeutic Activity: 8-22 mins                    Jetta Lout PTA 01/27/21, 10:40 AM

## 2021-01-28 LAB — BASIC METABOLIC PANEL
Anion gap: 8 (ref 5–15)
BUN: 16 mg/dL (ref 6–20)
CO2: 34 mmol/L — ABNORMAL HIGH (ref 22–32)
Calcium: 8.8 mg/dL — ABNORMAL LOW (ref 8.9–10.3)
Chloride: 97 mmol/L — ABNORMAL LOW (ref 98–111)
Creatinine, Ser: 1.04 mg/dL (ref 0.61–1.24)
GFR, Estimated: >60 mL/min (ref >60)
Glucose, Bld: 94 mg/dL (ref 70–99)
Potassium: 3.4 mmol/L — ABNORMAL LOW (ref 3.5–5.1)
Sodium: 139 mmol/L (ref 135–145)

## 2021-01-28 MED ORDER — POTASSIUM CHLORIDE CRYS ER 20 MEQ PO TBCR
40.0000 meq | EXTENDED_RELEASE_TABLET | Freq: Once | ORAL | Status: AC
  Administered 2021-01-28: 40 meq via ORAL
  Filled 2021-01-28: qty 2

## 2021-01-28 NOTE — Progress Notes (Signed)
Physical Therapy Treatment Patient Details Name: Christian [name] MRN: [id number] DOB: 06-28-1975 Today's Date: 01/28/2021   History of Present Illness Pt is a 46 y/o M admitted on 01/08/21 with c/c of SOB. Pt noted to be positive for Influenza A. Pt noted to have cellulitic area to R buttock. Imaging reveals multifocal PNA in the R lung. PMH: morbid obesity, HTN    PT Comments    Pt was in bed upon arriving. He agrees to session. On O2 during night however removed throughout session with sao2 > 90%. HR stable throughout as well. He was able to exit bed, stand, and ambulated with supervision. Ambulated 400 ft with +1 standing rest. Author adjusted RW to promote improved standing posture. After ambulating 400 ft, pt too fatigued to attempt stairs. He requested performing stairs next session. At conclusion of session, pt was repositioned in recliner with call bell in reach. Acute PT will continue to follow per current POC.    Recommendations for follow up therapy are one component of a multi-disciplinary discharge planning process, led by the attending physician.  Recommendations may be updated based on patient status, additional functional criteria and insurance authorization.  Follow Up Recommendations  Outpatient PT     Assistance Recommended at Discharge Set up Supervision/Assistance  Equipment Recommendations  None recommended by PT       Precautions / Restrictions Precautions Precautions: Fall Restrictions Weight Bearing Restrictions: No     Mobility  Bed Mobility Overal bed mobility: Modified Independent       Transfers Overall transfer level: Modified independent     Ambulation/Gait Ambulation/Gait assistance: Supervision Gait Distance (Feet): 400 Feet Assistive device: Rolling walker (2 wheels) (Bariatric) Gait Pattern/deviations: Step-through pattern Gait velocity: decreased     General Gait Details: author elevated pt's RW to promote better gait posture. No  LOB or unsteadiness during ambulation 400 ft. he requested holding on stairs until next session.HR and sao2 stable throughout    Balance Overall balance assessment: Needs assistance Sitting-balance support: Feet supported Sitting balance-[name]: Normal     Standing balance support: During functional activity;No upper extremity supported Standing balance-[name]: Good      Cognition Arousal/Alertness: Awake/alert Behavior During Therapy: WFL for tasks assessed/performed Overall Cognitive Status: Within Functional Limits for tasks assessed    General Comments: Pt is alert and oriented x4           General Comments General comments (skin integrity, edema, etc.): Pt continues to require assistance with applying shoes and performing self care.      Pertinent Vitals/Pain Pain Assessment: No/denies pain     PT Goals (current goals can now be found in the care plan section) Acute Rehab PT Goals Patient Stated Goal: find a safe place to live with power Progress towards PT goals: Progressing toward goals    Frequency    Min 2X/week      PT Plan Current plan remains appropriate       AM-PAC PT "6 Clicks" Mobility   Outcome Measure  Help needed turning from your back to your side while in a flat bed without using bedrails?: A Little Help needed moving from lying on your back to sitting on the side of a flat bed without using bedrails?: A Little Help needed moving to and from a bed to a chair (including a wheelchair)?: A Little Help needed standing up from a chair using your arms (e.g., wheelchair or bedside chair)?: A Little Help needed to walk in hospital room?: A  Little Help needed climbing 3-5 steps with a railing? : A Little 6 Click Score: 18    End of Session Equipment Utilized During Treatment: Gait belt Activity Tolerance: Patient tolerated treatment well Patient left: in chair;with call bell/phone within reach;with chair alarm set Nurse Communication:  Mobility status PT Visit Diagnosis: Muscle weakness (generalized) (M62.81)     Time: 0973-5329 PT Time Calculation (min) (ACUTE ONLY): 25 min  Charges:  $Gait Training: 8-22 mins $Therapeutic Activity: 8-22 mins                     Jetta Lout PTA 01/28/21, 9:25 AM

## 2021-01-28 NOTE — Progress Notes (Signed)
Occupational Therapy Treatment Patient Details Name: Christian [name] MRN: [id number] DOB: [birth year] Today's Date: 01/28/2021   History of present illness Pt is a 45 y/o M admitted on 01/08/21 with c/c of SOB. Pt noted to be positive for Influenza A. Pt noted to have cellulitic area to R buttock. Imaging reveals multifocal PNA in the R lung. PMH: morbid obesity, HTN   OT comments  Mr. Record seen for OT treatment on this date. Upon arrival to room pt awake/alert. Pt seated upright in chair. Pt agreeable to tx. SBA for LBD, don/doff socks, pt successful with donning using figure 4. Pt SBA for grooming, toothbrushing, standing sinkside ~3 min. Pt instructed in figure 4 technique for LBD. Pt return demonstrated instruction provided. Pt making good progress toward goals. Pt continues to benefit from skilled OT services to maximize return to PLOF and minimize risk of future falls, injury, caregiver burden, and readmission. Will continue to follow POC. Discharge recommendation remains appropriate.     Recommendations for follow up therapy are one component of a multi-disciplinary discharge planning process, led by the attending physician.  Recommendations may be updated based on patient status, additional functional criteria and insurance authorization.    Follow Up Recommendations  Home health OT    Assistance Recommended at Discharge Intermittent Supervision/Assistance  Equipment Recommendations  BSC/3in1;Tub/shower seat    Recommendations for Other Services      Precautions / Restrictions Precautions Precautions: Fall Restrictions Weight Bearing Restrictions: No       Mobility Bed Mobility               General bed mobility comments: in recliner pre/post session    Transfers Overall transfer level: Modified independent (VCs for technique) Equipment used: None Transfers: Sit to/from Stand Sit to Stand: Modified independent (Device/Increase time) (time/ VCs)                  Balance Overall balance assessment: Needs assistance Sitting-balance support: Feet supported Sitting balance-[name]: Normal     Standing balance support: During functional activity;No upper extremity supported Standing balance-[name]: Good                             ADL either performed or assessed with clinical judgement   ADL Overall ADL's : Needs assistance/impaired                                       General ADL Comments: SBA for LBD, don/doff socks, pt successful with donning using figure 4. Pt SBA for grooming, toothbrushing, standing sinkside ~3 min.    Extremity/Trunk Assessment              Vision       Perception     Praxis      Cognition Arousal/Alertness: Awake/alert Behavior During Therapy: WFL for tasks assessed/performed Overall Cognitive Status: Within Functional Limits for tasks assessed                                            Exercises Exercises: Other exercises Other Exercises Other Exercises: Pt educ re: OT role, falls prevention, adaptive dressing techniques Other Exercises: LBD- don/doff socks, toothbrushing, therapeutic dressing   Shoulder Instructions       General  Comments      Pertinent Vitals/ Pain       Pain Assessment: No/denies pain  Home Living                                          Prior Functioning/Environment              Frequency  Min 2X/week        Progress Toward Goals  OT Goals(current goals can now be found in the care plan section)  Progress towards OT goals: Progressing toward goals  Acute Rehab OT Goals Patient Stated Goal: to get better OT Goal Formulation: With patient Time For Goal Achievement: 02/09/21 Potential to Achieve Goals: Good ADL Goals Pt Will Perform Grooming: with modified independence;standing Pt Will Perform Lower Body Dressing: with supervision;sitting/lateral leans;sit to/from stand Pt  Will Perform Toileting - Clothing Manipulation and hygiene: with supervision;sit to/from stand;sitting/lateral leans Pt/caregiver will Perform Home Exercise Program: Increased ROM;Increased strength;With written HEP provided;With theraband;Independently  Plan Discharge plan remains appropriate    Co-evaluation                 AM-PAC OT "6 Clicks" Daily Activity     Outcome Measure   Help from another person eating meals?: None Help from another person taking care of personal grooming?: A Little Help from another person toileting, which includes using toliet, bedpan, or urinal?: A Little Help from another person bathing (including washing, rinsing, drying)?: A Little Help from another person to put on and taking off regular upper body clothing?: A Little Help from another person to put on and taking off regular lower body clothing?: A Little 6 Click Score: 19    End of Session    OT Visit Diagnosis: Unsteadiness on feet (R26.81);Muscle weakness (generalized) (M62.81)   Activity Tolerance Patient tolerated treatment well   Patient Left in chair;with call bell/phone within reach   Nurse Communication          Time: 6962-9528 OT Time Calculation (min): 19 min  Charges:    [place] [facility], Adine Madura 01/28/2021, 4:24 PM

## 2021-01-28 NOTE — TOC Progression Note (Signed)
Transition of Care [facility]) - Progression Note    Patient Details  Name: Christian [name] MRN: [id number] Date of Birth: 13-Jul-1975  Transition [facility]) CM/SW Contact  Maree Krabbe, LCSW Phone Number: 01/28/2021, 10:50 AM  Clinical Narrative:   [facility] director ask that CSW try to see if [facility] would accept pt. CSW has spoken to facility and they do have open slots. Referral was faxed.  Per Md pt is not using BIPAP here at night just regular 02, therefore pt can continue with 02 at night at a facility. Pt is not a candidate for NIV as he is uninsured and he also doesn't have stable housing and electricity.    Expected Discharge Plan: Homeless Shelter Barriers to Discharge: Continued Medical Work up  Expected Discharge Plan and Services Expected Discharge Plan: Homeless Shelter In-house Referral: Clinical Social Work   Post Acute Care Choice: NA Living arrangements for the past 2 months: Homeless Shelter Expected Discharge Date: 01/18/21                                     Social Determinants of Health (SDOH) Interventions    Readmission Risk Interventions Readmission Risk Prevention Plan 10/11/2020  Post Dischage Appt Complete  Medication Screening Complete  Transportation Screening Complete  Some recent data might be hidden

## 2021-01-28 NOTE — Progress Notes (Addendum)
PROGRESS NOTE    Christian [name]   ZOX:[id number]  DOB: [birth year]  PCP: Pcp, No    DOA: 01/08/2021 LOS: 20    Brief Narrative / Hospital Course to Date:   45 year old male with morbid obesity and recent prolonged hospitalization complicated with perforated bowel status post operative repair, depression.  He presented with acute hypoxic respiratory failure and found to have influenza A pneumonia.    Long length of stay, admitted on 01/08/2021.   Difficult disposition due to homelessness, unable to return to shelter, disability application pending.  Assessment & Plan   Principal Problem:   Influenza A with pneumonia Active Problems:   Morbid obesity with BMI of 50.0-59.9, adult (HCC)   Pressure ulcer of buttock   Essential hypertension   Acute respiratory failure with hypoxemia (HCC)   Obesity hypoventilation syndrome (HCC)   Chronic diastolic CHF (congestive heart failure) (HCC)   Cellulitis and abscess of buttock   Prediabetes   Homelessness   Hypokalemia   Sepsis without acute organ dysfunction (HCC)   Nocturnal hypoxia   Acute respiratory failure with hypoxia -present on admission due to influenza A pneumonia., Resolved.  Initial pulse ox was 80% on room air, now weaned off oxygen during the day.  Nocturnal hypoxia -see overnight oximetry results in paper chart.   Patient qualifies for nocturnal oxygen.  Supplement O2 overnight.  Influenza A pneumonia with sepsis -present on admission, resolved.   Completed course of Tamiflu.   Respiratory status improved.   Sepsis physiology resolved.  Obesity hypoventilation syndrome -patient initially required BiPAP, has been weaned off.   Needs outpatient sleep study after discharge.  Cellulitis with abscess of buttock -resolved.  Completed course of Augmentin.  Chronic diastolic CHF -stable volume status.  Continue torsemide twice daily.  Hypokalemia -likely due to torsemide.  On daily potassium supplementation.   Monitor BMP occasionally, daily labs not required at this point.  Essential hypertension -BP stable on torsemide, continue  Depression -continue Zoloft  Homelessness -Per TOC, homeless shelter not able to take the patient back.   Morbid obesity: Body mass index is 57.02 kg/m.  Complicates overall care and prognosis.  Recommend lifestyle modifications including physical activity and diet for weight loss and overall long-term health.   Pressure Injuries of Skin - continue wound care. I agree with description and assessment of wounds as follows: Pressure Injury 10/09/20 Buttocks Right Unstageable - Full thickness tissue loss in which the base of the injury is covered by slough (yellow, tan, gray, green or brown) and/or eschar (tan, brown or black) in the wound bed. (Active)  10/09/20 1933  Location: Buttocks  Location Orientation: Right  Staging: Unstageable - Full thickness tissue loss in which the base of the injury is covered by slough (yellow, tan, gray, green or brown) and/or eschar (tan, brown or black) in the wound bed.  Wound Description (Comments):   Present on Admission: Yes     Pressure Injury 10/22/20 Thigh Posterior;Proximal;Right Stage 3 -  Full thickness tissue loss. Subcutaneous fat may be visible but bone, tendon or muscle are NOT exposed. (Active)  10/22/20 1055  Location: Thigh  Location Orientation: Posterior;Proximal;Right  Staging: Stage 3 -  Full thickness tissue loss. Subcutaneous fat may be visible but bone, tendon or muscle are NOT exposed.  Wound Description (Comments):   Present on Admission: Yes (per handoff and report from other RN on unit- wound present on presentation to floor, patient reports wound present on admission- WOCN consulted)  DVT prophylaxis: SCDs Start: 01/08/21 2330   Diet:  Diet Orders (From admission, onward)     Start     Ordered   01/18/21 0000  Diet - low sodium heart healthy        01/18/21 1306   01/14/21 0748  Diet Heart  Room service appropriate? Yes; Fluid consistency: Thin  Diet effective now       Question Answer Comment  Room service appropriate? Yes   Fluid consistency: Thin      01/14/21 0747              Code Status: Full Code   Subjective 01/28/21    Patient up in recliner when seen on rounds today.  He was still reports having intermittent cough.  No fevers or chills or shortness of breath.  Tolerating use of oxygen at night.  Denies other acute complaints.   Disposition Plan & Communication   Status is: Inpatient  Remains inpatient appropriate because: Homelessness, unsafe discharge, requires oxygen supplementation and unable to return to shelter.  TOC working on placement options.   Consults, Procedures, Significant Events   Consultants:    Procedures:  None  Antimicrobials:  Anti-infectives (From admission, onward)    Start     Dose/Rate Route Frequency Ordered Stop   01/11/21 2200  oseltamivir (TAMIFLU) capsule 75 mg        75 mg Oral 2 times daily 01/11/21 1317 01/13/21 1041   01/10/21 1030  amoxicillin-clavulanate (AUGMENTIN) 875-125 MG per tablet 1 tablet        1 tablet Oral Every 12 hours 01/10/21 0943 01/14/21 2054   01/09/21 1000  metroNIDAZOLE (FLAGYL) IVPB 500 mg  Status:  Discontinued        500 mg 100 mL/hr over 60 Minutes Intravenous Every 12 hours 01/08/21 2330 01/09/21 0813   01/09/21 1000  oseltamivir (TAMIFLU) capsule 75 mg  Status:  Discontinued        75 mg Oral 2 times daily 01/08/21 2330 01/11/21 1317   01/09/21 0815  ceFAZolin (ANCEF) IVPB 2g/100 mL premix  Status:  Discontinued        2 g 200 mL/hr over 30 Minutes Intravenous Every 8 hours 01/09/21 0809 01/10/21 0943   01/09/21 0800  ceFEPIme (MAXIPIME) 2 g in sodium chloride 0.9 % 100 mL IVPB  Status:  Discontinued        2 g 200 mL/hr over 30 Minutes Intravenous Every 8 hours 01/09/21 0655 01/09/21 0813   01/09/21 0700  vancomycin (VANCOREADY) IVPB 2000 mg/400 mL  Status:  Discontinued         2,000 mg 200 mL/hr over 120 Minutes Intravenous Every 12 hours 01/09/21 0045 01/09/21 0813   01/09/21 0030  vancomycin (VANCOREADY) IVPB 1500 mg/300 mL        1,500 mg 150 mL/hr over 120 Minutes Intravenous  Once 01/09/21 0029 01/09/21 0230   01/08/21 2345  ceFEPIme (MAXIPIME) 2 g in sodium chloride 0.9 % 100 mL IVPB  Status:  Discontinued        2 g 200 mL/hr over 30 Minutes Intravenous Every 24 hours 01/08/21 2330 01/09/21 0655   01/08/21 1915  oseltamivir (TAMIFLU) capsule 75 mg  Status:  Discontinued        75 mg Oral 2 times daily 01/08/21 1910 01/08/21 2330   01/08/21 1815  ceFEPIme (MAXIPIME) 2 g in sodium chloride 0.9 % 100 mL IVPB        2 g 200 mL/hr over 30 Minutes  Intravenous  Once 01/08/21 1809 01/08/21 1902   01/08/21 1815  metroNIDAZOLE (FLAGYL) IVPB 500 mg        500 mg 100 mL/hr over 60 Minutes Intravenous  Once 01/08/21 1809 01/08/21 1930   01/08/21 1815  vancomycin (VANCOCIN) IVPB 1000 mg/200 mL premix        1,000 mg 200 mL/hr over 60 Minutes Intravenous  Once 01/08/21 1809 01/08/21 2006         Micro    Objective   Vitals:   01/27/21 2313 01/28/21 0518 01/28/21 0749 01/28/21 1126  BP: 129/78 134/75 125/88 130/73  Pulse: 81 79 79 75  Resp: 18 14 18 18   Temp: 97.8 F (36.6 C) 97.8 F (36.6 C) 98.5 F (36.9 C) 98.2 F (36.8 C)  TempSrc:    Oral  SpO2: 95% 94% 97% 92%  Weight:      Height:        Intake/Output Summary (Last 24 hours) at 01/28/2021 1431 Last data filed at 01/28/2021 1401 Gross per 24 hour  Intake 1080 ml  Output 1800 ml  Net -720 ml   Filed Weights   01/08/21 1759 01/25/21 1348  Weight: (!) 209.7 kg (!) 196 kg    Physical Exam:  General exam: in recliner chair, awake, alert, no acute distress, morbidly obese HEENT: moist mucus membranes, hearing grossly normal  Respiratory system: on room air, normal respiratory effort. Cardiovascular system: RRR, no pedal edema.   Central nervous system: A&O x3. no gross focal neurologic  deficits, normal speech Psychiatry: normal mood, congruent affect  Labs   Data Reviewed: I have personally reviewed following labs and imaging studies  CBC: Recent Labs  Lab 01/27/21 0420  WBC 8.0  HGB 12.2*  HCT 39.2  MCV 80.0  PLT 374   Basic Metabolic Panel: Recent Labs  Lab 01/23/21 0426 01/24/21 0319 01/25/21 0430 01/26/21 0245 01/27/21 0420 01/28/21 0554  NA 138 139 139 137 136 139  K 3.1* 3.4* 3.4* 3.5 3.3* 3.4*  CL 98 100 99 98 97* 97*  CO2 31 33* 34* 31 32 34*  GLUCOSE 95 101* 99 86 102* 94  BUN 14 15 17 16 14 16   CREATININE 0.79 0.87 0.88 0.84 0.84 1.04  CALCIUM 8.6* 8.6* 8.6* 8.5* 8.5* 8.8*  MG 2.3 2.2 2.2 2.2  --   --    GFR: Estimated Creatinine Clearance: 158.5 mL/min (by C-G formula based on SCr of 1.04 mg/dL). Liver Function Tests: No results for input(s): AST, ALT, ALKPHOS, BILITOT, PROT, ALBUMIN in the last 168 hours. No results for input(s): LIPASE, AMYLASE in the last 168 hours. No results for input(s): AMMONIA in the last 168 hours. Coagulation Profile: No results for input(s): INR, PROTIME in the last 168 hours. Cardiac Enzymes: No results for input(s): CKTOTAL, CKMB, CKMBINDEX, TROPONINI in the last 168 hours. BNP (last 3 results) No results for input(s): PROBNP in the last 8760 hours. HbA1C: No results for input(s): HGBA1C in the last 72 hours. CBG: No results for input(s): GLUCAP in the last 168 hours. Lipid Profile: No results for input(s): CHOL, HDL, LDLCALC, TRIG, CHOLHDL, LDLDIRECT in the last 72 hours. Thyroid Function Tests: No results for input(s): TSH, T4TOTAL, FREET4, T3FREE, THYROIDAB in the last 72 hours. Anemia Panel: No results for input(s): VITAMINB12, FOLATE, FERRITIN, TIBC, IRON, RETICCTPCT in the last 72 hours. Sepsis Labs: No results for input(s): PROCALCITON, LATICACIDVEN in the last 168 hours.  No results found for this or any previous visit (from the past  240 hour(s)).    Imaging Studies   No results  found.   Medications   Scheduled Meds:  docusate sodium  100 mg Oral BID   enoxaparin (LOVENOX) injection  0.5 mg/kg Subcutaneous Q24H   fluticasone  2 spray Each Nare Daily   guaiFENesin  15 mL Oral QID   potassium chloride  40 mEq Oral Daily   sertraline  50 mg Oral Daily   torsemide  20 mg Oral BID   Continuous Infusions:     LOS: 20 days    Time spent: 25 minutes with > 50% spent at bedside and in coordination of care     Ezekiel Slocumb, DO [facility]  01/28/2021, 2:31 PM      If 7PM-7AM, please contact night-coverage. How to contact the [facility] Attending or Consulting provider [place] or covering provider during after hours [place], for this patient?    Check the care team in [facility] and look for a) attending/consulting [facility] provider listed and b) the [facility] team listed Log into [email or web address] and use [place]'s universal password to access. If you do not have the password, please contact the hospital operator. Locate the [facility] provider you are looking for under [facility] and page to a number that you can be directly reached. If you still have difficulty reaching the provider, please page the [facility] (Director on Call) for the Hospitalists listed on amion for assistance.

## 2021-01-28 NOTE — [place] FL2 (Signed)
[place] MEDICAID FL2 LEVEL OF CARE SCREENING TOOL     IDENTIFICATION  Patient Name: Christian [name] Birthdate: [birth year] Sex: male Admission Date (Current Location): 01/08/2021  [place] and [place] Number:  Chiropodist and Address:  [facility], [address], [place], [place] [address]      Provider Number: [id number]  Attending Physician Name and Address:  Pennie Banter, DO  Relative Name and Phone Number:       Current Level of Care: Hospital Recommended Level of Care: Other (Comment) (Group Home) Prior Approval Number:    Date Approved/Denied:   PASRR Number:    Discharge Plan: Other (Comment) (Group HOme)    Current Diagnoses: Patient Active Problem List   Diagnosis Date Noted   Nocturnal hypoxia    Sepsis without acute organ dysfunction (HCC)    Influenza A with pneumonia 01/15/2021   Homelessness 01/15/2021   Hypokalemia 01/15/2021   Cellulitis and abscess of buttock 01/08/2021   Prediabetes 01/08/2021   Pleural effusion 11/25/2020   Chronic diastolic CHF (congestive heart failure) (HCC) 11/25/2020   Depression 11/25/2020   Obesity hypoventilation syndrome (HCC) 10/17/2020   Acute on chronic respiratory failure with hypoxia and hypercapnia (HCC)    Bowel perforation (HCC)    Hypotension    Acute respiratory failure with hypoxemia (HCC)    Weakness    Cellulitis of abdominal wall 10/10/2020   Impaired fasting glucose    Hyperglycemia 10/09/2020   AKI (acute kidney injury) (HCC) 10/09/2020   Pressure ulcer of back 10/09/2020   Pressure ulcer of buttock 10/09/2020   Essential hypertension 10/09/2020   Chest pain 04/06/2011   Morbid obesity with BMI of 50.0-59.9, adult (HCC)    Elevated blood pressure reading without diagnosis of hypertension     Orientation RESPIRATION BLADDER Height & Weight     Time, Self, Situation, Place  O2 ([place] 2L) Continent Weight: (!) 432 lb 3.2 oz (196 kg) Height:  6\' 1"  (185.4  cm)  BEHAVIORAL SYMPTOMS/MOOD NEUROLOGICAL BOWEL NUTRITION STATUS      Continent Diet (heart healthy)  AMBULATORY STATUS COMMUNICATION OF NEEDS Skin   Independent Verbally Normal                       Personal Care Assistance Level of Assistance  Dressing, Feeding, Bathing Bathing Assistance: Independent Feeding assistance: Independent Dressing Assistance: Independent     Functional Limitations Info  Sight, Hearing, Speech Sight Info: Adequate Hearing Info: Adequate Speech Info: Adequate    SPECIAL CARE FACTORS FREQUENCY                       Contractures Contractures Info: Not present    Additional Factors Info  Code Status, Allergies Code Status Info: Full Code Allergies Info: Shellfish Allergy           Current Medications (01/28/2021):  This is the current hospital active medication list Current Facility-Administered Medications  Medication Dose Route Frequency Provider Last Rate Last Admin   acetaminophen (TYLENOL) tablet 650 mg  650 mg Oral Q6H PRN 14/02/2020, MD       Or   acetaminophen (TYLENOL) suppository 650 mg  650 mg Rectal Q6H PRN Gertha Calkin, MD       albuterol (PROVENTIL) (2.5 MG/3ML) 0.083% nebulizer solution 2.5 mg  2.5 mg Inhalation Q4H PRN Gertha Calkin, MD       docusate sodium (COLACE) capsule 100 mg  100  mg Oral BID Rolly Salter, MD   100 mg at 01/28/21 1006   enoxaparin (LOVENOX) injection 105 mg  0.5 mg/kg Subcutaneous Q24H Kathrynn Running, MD   105 mg at 01/28/21 1006   fluticasone (FLONASE) 50 MCG/ACT nasal spray 2 spray  2 spray Each Nare Daily Rolly Salter, MD   2 spray at 01/28/21 1007   guaiFENesin (ROBITUSSIN) 100 MG/5ML liquid 15 mL  15 mL Oral QID Doloris Hall, RPH   15 mL at 01/28/21 1006   ondansetron (ZOFRAN) tablet 4 mg  4 mg Oral Q8H PRN Rolly Salter, MD       potassium chloride SA (KLOR-CON) CR tablet 40 mEq  40 mEq Oral Daily Alford Highland, MD   40 mEq at 01/28/21 1007   sertraline (ZOLOFT)  tablet 50 mg  50 mg Oral Daily Kathrynn Running, MD   50 mg at 01/28/21 1007   torsemide (DEMADEX) tablet 20 mg  20 mg Oral BID Rolly Salter, MD   20 mg at 01/28/21 1007     Discharge Medications: Please see discharge summary for a list of discharge medications.  Relevant Imaging Results:  Relevant Lab Results:   Additional Information SSN:[id number]  Reuel Boom Anastacio Bua, LCSW

## 2021-01-29 NOTE — Progress Notes (Signed)
PROGRESS NOTE    Christian [name]   [id number]  DOB: Jul 29, 1975  PCP: Pcp, No    DOA: 01/08/2021 LOS: 21    Brief Narrative / Hospital Course to Date:   45 year old male with morbid obesity and recent prolonged hospitalization complicated with perforated bowel status post operative repair, depression.  He presented with acute hypoxic respiratory failure and found to have influenza A pneumonia.    Long length of stay, admitted on 01/08/2021.   Difficult disposition due to homelessness, unable to return to shelter, disability application pending.  Assessment & Plan   Principal Problem:   Influenza A with pneumonia Active Problems:   Morbid obesity with BMI of 50.0-59.9, adult (HCC)   Pressure ulcer of buttock   Essential hypertension   Acute respiratory failure with hypoxemia (HCC)   Obesity hypoventilation syndrome (HCC)   Chronic diastolic CHF (congestive heart failure) (HCC)   Cellulitis and abscess of buttock   Prediabetes   Homelessness   Hypokalemia   Sepsis without acute organ dysfunction (HCC)   Nocturnal hypoxia   Acute respiratory failure with hypoxia -present on admission due to influenza A pneumonia., Resolved.  Initial pulse ox was 80% on room air, now weaned off oxygen during the day.  Nocturnal hypoxia -see overnight oximetry results in paper chart.   Patient qualifies for nocturnal oxygen.  Supplement O2 overnight.  Influenza A pneumonia with sepsis -present on admission, resolved.   Completed course of Tamiflu.   Respiratory status improved.   Sepsis physiology resolved.  Obesity hypoventilation syndrome -patient initially required BiPAP, has been weaned off.   Needs outpatient sleep study after discharge.  Cellulitis with abscess of buttock -resolved.  Completed course of Augmentin.  Chronic diastolic CHF -stable volume status.  Continue torsemide twice daily.  Hypokalemia -likely due to torsemide.  On daily potassium supplementation.   Monitor BMP occasionally, daily labs not required at this point.  Essential hypertension -BP stable on torsemide, continue  Depression -continue Zoloft  Homelessness -Per TOC, homeless shelter not able to take the patient back.   Morbid obesity: Body mass index is 57.02 kg/m.  Complicates overall care and prognosis.  Recommend lifestyle modifications including physical activity and diet for weight loss and overall long-term health.   Pressure Injuries of Skin - continue wound care. --Home health RN requested --Wound care clinic follow up after d/c  I agree with description and assessment of wounds as follows: Pressure Injury 10/09/20 Buttocks Right Unstageable - Full thickness tissue loss in which the base of the injury is covered by slough (yellow, tan, gray, green or brown) and/or eschar (tan, brown or black) in the wound bed. (Active)  10/09/20 1933  Location: Buttocks  Location Orientation: Right  Staging: Unstageable - Full thickness tissue loss in which the base of the injury is covered by slough (yellow, tan, gray, green or brown) and/or eschar (tan, brown or black) in the wound bed.  Wound Description (Comments):   Present on Admission: Yes     Pressure Injury 10/22/20 Thigh Posterior;Proximal;Right Stage 3 -  Full thickness tissue loss. Subcutaneous fat may be visible but bone, tendon or muscle are NOT exposed. (Active)  10/22/20 1055  Location: Thigh  Location Orientation: Posterior;Proximal;Right  Staging: Stage 3 -  Full thickness tissue loss. Subcutaneous fat may be visible but bone, tendon or muscle are NOT exposed.  Wound Description (Comments):   Present on Admission: Yes (per handoff and report from other RN on unit- wound present on presentation to floor,  patient reports wound present on admission- WOCN consulted)     DVT prophylaxis: SCDs Start: 01/08/21 2330   Diet:  Diet Orders (From admission, onward)     Start     Ordered   01/18/21 0000  Diet - low  sodium heart healthy        01/18/21 1306   01/14/21 0748  Diet Heart Room service appropriate? Yes; Fluid consistency: Thin  Diet effective now       Question Answer Comment  Room service appropriate? Yes   Fluid consistency: Thin      01/14/21 0747              Code Status: Full Code   Subjective 01/29/21    Patient up in recliner when seen on rounds today. Feeling well overall.  No acute complaints.  Eating breakfast.   Disposition Plan & Communication   Status is: Inpatient  Remains inpatient appropriate because: Homelessness, unsafe discharge, requires oxygen supplementation and unable to return to shelter.  TOC working on placement options, possibly group home.   Consults, Procedures, Significant Events   Consultants:    Procedures:  None  Antimicrobials:  Anti-infectives (From admission, onward)    Start     Dose/Rate Route Frequency Ordered Stop   01/11/21 2200  oseltamivir (TAMIFLU) capsule 75 mg        75 mg Oral 2 times daily 01/11/21 1317 01/13/21 1041   01/10/21 1030  amoxicillin-clavulanate (AUGMENTIN) 875-125 MG per tablet 1 tablet        1 tablet Oral Every 12 hours 01/10/21 0943 01/14/21 2054   01/09/21 1000  metroNIDAZOLE (FLAGYL) IVPB 500 mg  Status:  Discontinued        500 mg 100 mL/hr over 60 Minutes Intravenous Every 12 hours 01/08/21 2330 01/09/21 0813   01/09/21 1000  oseltamivir (TAMIFLU) capsule 75 mg  Status:  Discontinued        75 mg Oral 2 times daily 01/08/21 2330 01/11/21 1317   01/09/21 0815  ceFAZolin (ANCEF) IVPB 2g/100 mL premix  Status:  Discontinued        2 g 200 mL/hr over 30 Minutes Intravenous Every 8 hours 01/09/21 0809 01/10/21 0943   01/09/21 0800  ceFEPIme (MAXIPIME) 2 g in sodium chloride 0.9 % 100 mL IVPB  Status:  Discontinued        2 g 200 mL/hr over 30 Minutes Intravenous Every 8 hours 01/09/21 0655 01/09/21 0813   01/09/21 0700  vancomycin (VANCOREADY) IVPB 2000 mg/400 mL  Status:  Discontinued        2,000  mg 200 mL/hr over 120 Minutes Intravenous Every 12 hours 01/09/21 0045 01/09/21 0813   01/09/21 0030  vancomycin (VANCOREADY) IVPB 1500 mg/300 mL        1,500 mg 150 mL/hr over 120 Minutes Intravenous  Once 01/09/21 0029 01/09/21 0230   01/08/21 2345  ceFEPIme (MAXIPIME) 2 g in sodium chloride 0.9 % 100 mL IVPB  Status:  Discontinued        2 g 200 mL/hr over 30 Minutes Intravenous Every 24 hours 01/08/21 2330 01/09/21 0655   01/08/21 1915  oseltamivir (TAMIFLU) capsule 75 mg  Status:  Discontinued        75 mg Oral 2 times daily 01/08/21 1910 01/08/21 2330   01/08/21 1815  ceFEPIme (MAXIPIME) 2 g in sodium chloride 0.9 % 100 mL IVPB        2 g 200 mL/hr over 30 Minutes Intravenous  Once 01/08/21  1809 01/08/21 1902   01/08/21 1815  metroNIDAZOLE (FLAGYL) IVPB 500 mg        500 mg 100 mL/hr over 60 Minutes Intravenous  Once 01/08/21 1809 01/08/21 1930   01/08/21 1815  vancomycin (VANCOCIN) IVPB 1000 mg/200 mL premix        1,000 mg 200 mL/hr over 60 Minutes Intravenous  Once 01/08/21 1809 01/08/21 2006         Micro    Objective   Vitals:   01/29/21 0004 01/29/21 0511 01/29/21 0801 01/29/21 1151  BP: 121/68 119/82 123/79 117/71  Pulse: 77 70 66 72  Resp: 20 18 19 19   Temp: 98 F (36.7 C) (!) 97.5 F (36.4 C) 98.1 F (36.7 C) 98.1 F (36.7 C)  TempSrc:   Oral   SpO2: 94% 96% 98% 91%  Weight:      Height:        Intake/Output Summary (Last 24 hours) at 01/29/2021 1258 Last data filed at 01/29/2021 1151 Gross per 24 hour  Intake 1560 ml  Output 2550 ml  Net -990 ml   Filed Weights   01/08/21 1759 01/25/21 1348  Weight: (!) 209.7 kg (!) 196 kg    Physical Exam:  General exam: in recliner chair, awake, alert, no acute distress, morbidly obese Respiratory system: on room air, normal respiratory effort. Cardiovascular system: RRR, no pedal edema.   Central nervous system: A&O x3. no gross focal neurologic deficits, normal speech Psychiatry: normal mood, congruent  affect  Labs   Data Reviewed: I have personally reviewed following labs and imaging studies  CBC: Recent Labs  Lab 01/27/21 0420  WBC 8.0  HGB 12.2*  HCT 39.2  MCV 80.0  PLT [id number]   Basic Metabolic Panel: Recent Labs  Lab 01/23/21 0426 01/24/21 0319 01/25/21 0430 01/26/21 0245 01/27/21 0420 01/28/21 0554  NA 138 139 139 137 136 139  K 3.1* 3.4* 3.4* 3.5 3.3* 3.4*  CL 98 100 99 98 97* 97*  CO2 31 33* 34* 31 32 34*  GLUCOSE 95 101* 99 86 102* 94  BUN 14 15 17 16 14 16   CREATININE 0.79 0.87 0.88 0.84 0.84 1.04  CALCIUM 8.6* 8.6* 8.6* 8.5* 8.5* 8.8*  MG 2.3 2.2 2.2 2.2  --   --    GFR: Estimated Creatinine Clearance: 158.5 mL/min (by C-G formula based on SCr of 1.04 mg/dL). Liver Function Tests: No results for input(s): AST, ALT, ALKPHOS, BILITOT, PROT, ALBUMIN in the last 168 hours. No results for input(s): LIPASE, AMYLASE in the last 168 hours. No results for input(s): AMMONIA in the last 168 hours. Coagulation Profile: No results for input(s): INR, PROTIME in the last 168 hours. Cardiac Enzymes: No results for input(s): CKTOTAL, CKMB, CKMBINDEX, TROPONINI in the last 168 hours. BNP (last 3 results) No results for input(s): PROBNP in the last 8760 hours. HbA1C: No results for input(s): HGBA1C in the last 72 hours. CBG: No results for input(s): GLUCAP in the last 168 hours. Lipid Profile: No results for input(s): CHOL, HDL, LDLCALC, TRIG, CHOLHDL, LDLDIRECT in the last 72 hours. Thyroid Function Tests: No results for input(s): TSH, T4TOTAL, FREET4, T3FREE, THYROIDAB in the last 72 hours. Anemia Panel: No results for input(s): VITAMINB12, FOLATE, FERRITIN, TIBC, IRON, RETICCTPCT in the last 72 hours. Sepsis Labs: No results for input(s): PROCALCITON, LATICACIDVEN in the last 168 hours.  No results found for this or any previous visit (from the past 240 hour(s)).    Imaging Studies   No results  found.   Medications   Scheduled Meds:  docusate sodium  100  mg Oral BID   enoxaparin (LOVENOX) injection  0.5 mg/kg Subcutaneous Q24H   fluticasone  2 spray Each Nare Daily   guaiFENesin  15 mL Oral QID   potassium chloride  40 mEq Oral Daily   sertraline  50 mg Oral Daily   torsemide  20 mg Oral BID   Continuous Infusions:     LOS: 21 days    Time spent: 20 minutes with > 50% spent at bedside and in coordination of care     Ezekiel Slocumb, DO [facility]  01/29/2021, 12:58 PM      If 7PM-7AM, please contact night-coverage. How to contact the [facility] Attending or Consulting provider [place] or covering provider during after hours [place], for this patient?    Check the care team in [facility] and look for a) attending/consulting [facility] provider listed and b) the [facility] team listed Log into [email or web address] and use [place]'s universal password to access. If you do not have the password, please contact the hospital operator. Locate the [facility] provider you are looking for under [facility] and page to a number that you can be directly reached. If you still have difficulty reaching the provider, please page the [facility] (Director on Call) for the Hospitalists listed on amion for assistance.

## 2021-01-29 NOTE — Progress Notes (Signed)
Physical Therapy Re-Evaluation Patient Details Name: Christian [name] MRN: [id number] DOB: Jul 12, 1975 Today's Date: 01/29/2021   History of Present Illness Pt is a 45 y/o M admitted on 01/08/21 with c/c of SOB. Pt noted to be positive for Influenza A. Pt noted to have cellulitic area to R buttock. Imaging reveals multifocal PNA in the R lung. PMH: morbid obesity, HTN    PT Comments    Patient alert, agreeable to to PT, seen for a re-evaluation and goal update. Overall the patient has made great progress during this admission. He continues to increase ambulation distance with decreasing assistance (148ft today with RW and supervision, previous visit 423ft). Stair navigation this session with CGA, cues for activity pacing technique. Ascended/descended 4 steps with rail on L then bilateral rails to descend. He was also able to don his shoes without physical assist, just verbal cues. 5 times sit to stand performed; able to complete 27.79 seconds, indicative of decreased function and strength for age predictive norms. Overall the patient would benefit from continued skilled PT to maximize function and safety. May benefit from DGI, FGA, or BERG next session to assess balance deficits. Pt in room with all needs in reach at end of session.      Recommendations for follow up therapy are one component of a multi-disciplinary discharge planning process, led by the attending physician.  Recommendations may be updated based on patient status, additional functional criteria and insurance authorization.  Follow Up Recommendations  Outpatient PT     Assistance Recommended at Discharge Set up Supervision/Assistance  Equipment Recommendations  None recommended by PT    Recommendations for Other Services OT consult     Precautions / Restrictions Precautions Precautions: Fall Restrictions Weight Bearing Restrictions: No     Mobility  Bed Mobility Overal bed mobility: Modified Independent              General bed mobility comments: in recliner pre/post session    Transfers Overall transfer level: Needs assistance Equipment used: None;Rolling walker (2 wheels) Transfers: Sit to/from Stand Sit to Stand: Supervision           General transfer comment: no physical asisstance to stand or sit    Ambulation/Gait Ambulation/Gait assistance: Supervision Gait Distance (Feet): 100 Feet Assistive device: Rolling walker (2 wheels) (bariatric) Gait Pattern/deviations: Step-through pattern           Stairs Stairs: Yes Stairs assistance: Min guard Stair Management: One rail Right;One rail Left;Step to pattern Number of Stairs: 4 General stair comments: steady, cues for technique and activity pacing   Wheelchair Mobility    Modified Rankin (Stroke Patients Only)       Balance Overall balance assessment: Needs assistance Sitting-balance support: Feet supported Sitting balance-[name]: Normal     Standing balance support: During functional activity;No upper extremity supported Standing balance-[name]: Good Standing balance comment: no LOB with UE support                            Cognition Arousal/Alertness: Awake/alert Behavior During Therapy: WFL for tasks assessed/performed Overall Cognitive Status: Within Functional Limits for tasks assessed                                          Exercises Other Exercises Other Exercises: 5 times sit to stand: 27.79 seconds without UE support. instructed pt  goal is to be at 15 seconds or less Other Exercises: able to don shoes with set up, but cues for safety (to sit to pull shoes on fully instead of bending forward in standing    General Comments        Pertinent Vitals/Pain Pain Assessment: No/denies pain    Home Living                          Prior Function            PT Goals (current goals can now be found in the care plan section) Acute Rehab PT  Goals Time For Goal Achievement: 02/12/21 Potential to Achieve Goals: Good Additional Goals Additional Goal #1: The patient will be able to perform 5 times sit to stand without UE support in less than 13 seconds to demonstrate strength/function of normative values of his age population. Progress towards PT goals: Progressing toward goals    Frequency    Min 2X/week      PT Plan Current plan remains appropriate    Co-evaluation              AM-PAC PT "6 Clicks" Mobility   Outcome Measure  Help needed turning from your back to your side while in a flat bed without using bedrails?: A Little Help needed moving from lying on your back to sitting on the side of a flat bed without using bedrails?: A Little Help needed moving to and from a bed to a chair (including a wheelchair)?: A Little Help needed standing up from a chair using your arms (e.g., wheelchair or bedside chair)?: None Help needed to walk in hospital room?: None Help needed climbing 3-5 steps with a railing? : A Little 6 Click Score: 20    End of Session Equipment Utilized During Treatment: Gait belt Activity Tolerance: Patient tolerated treatment well Patient left: in chair;with call bell/phone within reach Nurse Communication: Mobility status PT Visit Diagnosis: Muscle weakness (generalized) (M62.81)     Time: 5852-7782 PT Time Calculation (min) (ACUTE ONLY): 25 min  Charges:  $Gait Training: 8-22 mins $Therapeutic Exercise: 8-22 mins                     Olga Coaster PT, DPT 3:18 PM,01/29/21

## 2021-01-29 NOTE — TOC Progression Note (Signed)
Transition of Care [facility]) - Progression Note    Patient Details  Name: Torris House MRN: [id number] Date of Birth: Oct 07, 1975  Transition of [facility]) CM/SW Contact  Maree Krabbe, LCSW Phone Number: 01/29/2021, 3:27 PM  Clinical Narrative:   [facility] Department Director Wandra Mannan and Interim Executive Director Rhonda Rumple are also reaching out to see if a SNF can accept pt under a 30 day LOG. CSW has not got a response from the group home at this time.    Expected Discharge Plan: Homeless Shelter Barriers to Discharge: Continued Medical Work up  Expected Discharge Plan and Services Expected Discharge Plan: Homeless Shelter In-house Referral: Clinical Social Work   Post Acute Care Choice: NA Living arrangements for the past 2 months: Homeless Shelter Expected Discharge Date: 01/18/21                                     Social Determinants of Health (SDOH) Interventions    Readmission Risk Interventions Readmission Risk Prevention Plan 10/11/2020  Post Dischage Appt Complete  Medication Screening Complete  Transportation Screening Complete  Some recent data might be hidden

## 2021-01-30 LAB — BASIC METABOLIC PANEL
Anion gap: 7 (ref 5–15)
BUN: 15 mg/dL (ref 6–20)
CO2: 33 mmol/L — ABNORMAL HIGH (ref 22–32)
Calcium: 8.5 mg/dL — ABNORMAL LOW (ref 8.9–10.3)
Chloride: 95 mmol/L — ABNORMAL LOW (ref 98–111)
Creatinine, Ser: 0.73 mg/dL (ref 0.61–1.24)
GFR, Estimated: >60 mL/min (ref >60)
Glucose, Bld: 95 mg/dL (ref 70–99)
Potassium: 3.6 mmol/L (ref 3.5–5.1)
Sodium: 135 mmol/L (ref 135–145)

## 2021-01-30 NOTE — Progress Notes (Signed)
PROGRESS NOTE    Christian [name]   LKG:[id number]  DOB: [birth year]  PCP: Pcp, No    DOA: 01/08/2021 LOS: 22    Brief Narrative / Hospital Course to Date:   45 year old male with morbid obesity and recent prolonged hospitalization complicated with perforated bowel status post operative repair, depression.  He presented with acute hypoxic respiratory failure and found to have influenza A pneumonia.    Long length of stay, admitted on 01/08/2021.   Difficult disposition due to homelessness, unable to return to shelter, disability application pending.  Assessment & Plan   Principal Problem:   Influenza A with pneumonia Active Problems:   Morbid obesity with BMI of 50.0-59.9, adult (HCC)   Pressure ulcer of buttock   Essential hypertension   Acute respiratory failure with hypoxemia (HCC)   Obesity hypoventilation syndrome (HCC)   Chronic diastolic CHF (congestive heart failure) (HCC)   Cellulitis and abscess of buttock   Prediabetes   Homelessness   Hypokalemia   Sepsis without acute organ dysfunction (HCC)   Nocturnal hypoxia   Acute respiratory failure with hypoxia -present on admission due to influenza A pneumonia., Resolved.  Initial pulse ox was 80% on room air, now weaned off oxygen during the day.  Nocturnal hypoxia -see overnight oximetry results in paper chart.   Patient qualifies for nocturnal oxygen.  Supplement O2 overnight.  Influenza A pneumonia with sepsis -present on admission, resolved.   Completed course of Tamiflu.   Respiratory status improved.   Sepsis physiology resolved.  Obesity hypoventilation syndrome -patient initially required BiPAP, has been weaned off.   Needs outpatient sleep study after discharge.  Cellulitis with abscess of buttock -resolved.  Completed course of Augmentin.  Chronic diastolic CHF -stable volume status.  Continue torsemide twice daily.  Hypokalemia -likely due to torsemide.  On daily potassium supplementation.   Monitor BMP occasionally, daily labs not required at this point.  Essential hypertension -BP stable on torsemide, continue  Depression -continue Zoloft  Homelessness -Per TOC, homeless shelter not able to take the patient back.   Morbid obesity: Body mass index is 56.53 kg/m.  Complicates overall care and prognosis.  Recommend lifestyle modifications including physical activity and diet for weight loss and overall long-term health.   Pressure Injuries of Skin - continue wound care. --Home health RN requested --Wound care clinic follow up after d/c  I agree with description and assessment of wounds as follows: Pressure Injury 10/09/20 Buttocks Right Unstageable - Full thickness tissue loss in which the base of the injury is covered by slough (yellow, tan, gray, green or brown) and/or eschar (tan, brown or black) in the wound bed. (Active)  10/09/20 1933  Location: Buttocks  Location Orientation: Right  Staging: Unstageable - Full thickness tissue loss in which the base of the injury is covered by slough (yellow, tan, gray, green or brown) and/or eschar (tan, brown or black) in the wound bed.  Wound Description (Comments):   Present on Admission: Yes     Pressure Injury 10/22/20 Thigh Posterior;Proximal;Right Stage 3 -  Full thickness tissue loss. Subcutaneous fat may be visible but bone, tendon or muscle are NOT exposed. (Active)  10/22/20 1055  Location: Thigh  Location Orientation: Posterior;Proximal;Right  Staging: Stage 3 -  Full thickness tissue loss. Subcutaneous fat may be visible but bone, tendon or muscle are NOT exposed.  Wound Description (Comments):   Present on Admission: Yes (per handoff and report from other RN on unit- wound present on presentation to floor,  patient reports wound present on admission- WOCN consulted)     DVT prophylaxis: SCDs Start: 01/08/21 2330   Diet:  Diet Orders (From admission, onward)     Start     Ordered   01/18/21 0000  Diet - low  sodium heart healthy        01/18/21 1306   01/14/21 0748  Diet Heart Room service appropriate? Yes; Fluid consistency: Thin  Diet effective now       Question Answer Comment  Room service appropriate? Yes   Fluid consistency: Thin      01/14/21 0747              Code Status: Full Code   Subjective 01/30/21    Patient sleeping soundly this AM.  Wakes very briefly.  No acute complaints or acute events reported.   Disposition Plan & Communication   Status is: Inpatient  Remains inpatient appropriate because: Homelessness, unsafe discharge, requires oxygen supplementation and unable to return to shelter.  TOC working on placement options, possibly group home vs SNF.   Consults, Procedures, Significant Events   Consultants:    Procedures:  None  Antimicrobials:  Anti-infectives (From admission, onward)    Start     Dose/Rate Route Frequency Ordered Stop   01/11/21 2200  oseltamivir (TAMIFLU) capsule 75 mg        75 mg Oral 2 times daily 01/11/21 1317 01/13/21 1041   01/10/21 1030  amoxicillin-clavulanate (AUGMENTIN) 875-125 MG per tablet 1 tablet        1 tablet Oral Every 12 hours 01/10/21 0943 01/14/21 2054   01/09/21 1000  metroNIDAZOLE (FLAGYL) IVPB 500 mg  Status:  Discontinued        500 mg 100 mL/hr over 60 Minutes Intravenous Every 12 hours 01/08/21 2330 01/09/21 0813   01/09/21 1000  oseltamivir (TAMIFLU) capsule 75 mg  Status:  Discontinued        75 mg Oral 2 times daily 01/08/21 2330 01/11/21 1317   01/09/21 0815  ceFAZolin (ANCEF) IVPB 2g/100 mL premix  Status:  Discontinued        2 g 200 mL/hr over 30 Minutes Intravenous Every 8 hours 01/09/21 0809 01/10/21 0943   01/09/21 0800  ceFEPIme (MAXIPIME) 2 g in sodium chloride 0.9 % 100 mL IVPB  Status:  Discontinued        2 g 200 mL/hr over 30 Minutes Intravenous Every 8 hours 01/09/21 0655 01/09/21 0813   01/09/21 0700  vancomycin (VANCOREADY) IVPB 2000 mg/400 mL  Status:  Discontinued        2,000  mg 200 mL/hr over 120 Minutes Intravenous Every 12 hours 01/09/21 0045 01/09/21 0813   01/09/21 0030  vancomycin (VANCOREADY) IVPB 1500 mg/300 mL        1,500 mg 150 mL/hr over 120 Minutes Intravenous  Once 01/09/21 0029 01/09/21 0230   01/08/21 2345  ceFEPIme (MAXIPIME) 2 g in sodium chloride 0.9 % 100 mL IVPB  Status:  Discontinued        2 g 200 mL/hr over 30 Minutes Intravenous Every 24 hours 01/08/21 2330 01/09/21 0655   01/08/21 1915  oseltamivir (TAMIFLU) capsule 75 mg  Status:  Discontinued        75 mg Oral 2 times daily 01/08/21 1910 01/08/21 2330   01/08/21 1815  ceFEPIme (MAXIPIME) 2 g in sodium chloride 0.9 % 100 mL IVPB        2 g 200 mL/hr over 30 Minutes Intravenous  Once 01/08/21  1809 01/08/21 1902   01/08/21 1815  metroNIDAZOLE (FLAGYL) IVPB 500 mg        500 mg 100 mL/hr over 60 Minutes Intravenous  Once 01/08/21 1809 01/08/21 1930   01/08/21 1815  vancomycin (VANCOCIN) IVPB 1000 mg/200 mL premix        1,000 mg 200 mL/hr over 60 Minutes Intravenous  Once 01/08/21 1809 01/08/21 2006         Micro    Objective   Vitals:   01/30/21 0415 01/30/21 0500 01/30/21 0742 01/30/21 1117  BP: 120/76  132/83 107/62  Pulse: 73  72 77  Resp: 20  18 18   Temp: 97.8 F (36.6 C)  97.9 F (36.6 C) 97.9 F (36.6 C)  TempSrc:      SpO2: 96%  94% 95%  Weight:  (!) 194.4 kg    Height:        Intake/Output Summary (Last 24 hours) at 01/30/2021 1339 Last data filed at 01/30/2021 1333 Gross per 24 hour  Intake 1200 ml  Output 2575 ml  Net -1375 ml   Filed Weights   01/08/21 1759 01/25/21 1348 01/30/21 0500  Weight: (!) 209.7 kg (!) 196 kg (!) 194.4 kg    Physical Exam:  General exam: sleeping comfortably, no acute distress, morbidly obese Respiratory system: on 2 L/min [place] O2, normal respiratory effort. Cardiovascular system: RRR, no pedal edema.   Central nervous system: grossly non-focal exam  Labs   Data Reviewed: I have personally reviewed following labs and  imaging studies  CBC: Recent Labs  Lab 01/27/21 0420  WBC 8.0  HGB 12.2*  HCT 39.2  MCV 80.0  PLT [id number]   Basic Metabolic Panel: Recent Labs  Lab 01/24/21 0319 01/25/21 0430 01/26/21 0245 01/27/21 0420 01/28/21 0554 01/30/21 0645  NA 139 139 137 136 139 135  K 3.4* 3.4* 3.5 3.3* 3.4* 3.6  CL 100 99 98 97* 97* 95*  CO2 33* 34* 31 32 34* 33*  GLUCOSE 101* 99 86 102* 94 95  BUN 15 17 16 14 16 15   CREATININE 0.87 0.88 0.84 0.84 1.04 0.73  CALCIUM 8.6* 8.6* 8.5* 8.5* 8.8* 8.5*  MG 2.2 2.2 2.2  --   --   --    GFR: Estimated Creatinine Clearance: 205.1 mL/min (by C-G formula based on SCr of 0.73 mg/dL). Liver Function Tests: No results for input(s): AST, ALT, ALKPHOS, BILITOT, PROT, ALBUMIN in the last 168 hours. No results for input(s): LIPASE, AMYLASE in the last 168 hours. No results for input(s): AMMONIA in the last 168 hours. Coagulation Profile: No results for input(s): INR, PROTIME in the last 168 hours. Cardiac Enzymes: No results for input(s): CKTOTAL, CKMB, CKMBINDEX, TROPONINI in the last 168 hours. BNP (last 3 results) No results for input(s): PROBNP in the last 8760 hours. HbA1C: No results for input(s): HGBA1C in the last 72 hours. CBG: No results for input(s): GLUCAP in the last 168 hours. Lipid Profile: No results for input(s): CHOL, HDL, LDLCALC, TRIG, CHOLHDL, LDLDIRECT in the last 72 hours. Thyroid Function Tests: No results for input(s): TSH, T4TOTAL, FREET4, T3FREE, THYROIDAB in the last 72 hours. Anemia Panel: No results for input(s): VITAMINB12, FOLATE, FERRITIN, TIBC, IRON, RETICCTPCT in the last 72 hours. Sepsis Labs: No results for input(s): PROCALCITON, LATICACIDVEN in the last 168 hours.  No results found for this or any previous visit (from the past 240 hour(s)).    Imaging Studies   No results found.   Medications   Scheduled  Meds:  docusate sodium  100 mg Oral BID   enoxaparin (LOVENOX) injection  0.5 mg/kg Subcutaneous Q24H    fluticasone  2 spray Each Nare Daily   guaiFENesin  15 mL Oral QID   potassium chloride  40 mEq Oral Daily   sertraline  50 mg Oral Daily   torsemide  20 mg Oral BID   Continuous Infusions:     LOS: 22 days    Time spent: 20 minutes with > 50% spent at bedside and in coordination of care     Ezekiel Slocumb, DO [facility]  01/30/2021, 1:39 PM      If 7PM-7AM, please contact night-coverage. How to contact the [facility] Attending or Consulting provider [place] or covering provider during after hours [place], for this patient?    Check the care team in [facility] and look for a) attending/consulting [facility] provider listed and b) the [facility] team listed Log into [email or web address] and use [place]'s universal password to access. If you do not have the password, please contact the hospital operator. Locate the [facility] provider you are looking for under [facility] and page to a number that you can be directly reached. If you still have difficulty reaching the provider, please page the [facility] (Director on Call) for the Hospitalists listed on amion for assistance.

## 2021-01-31 DIAGNOSIS — L89300 Pressure ulcer of unspecified buttock, unstageable: Secondary | ICD-10-CM

## 2021-01-31 NOTE — Progress Notes (Signed)
PROGRESS NOTE    Christian [name]  [id number] DOB: 06/13/1975 DOA: 01/08/2021 PCP: Pcp, No    Chief Complaint  Patient presents with   Shortness of Breath    Brief Narrative:  45 year old male with morbid obesity and recent prolonged hospitalization complicated with perforated bowel status post operative repair, depression.  He presented with acute hypoxic respiratory failure and found to have influenza A pneumonia.     Long length of stay, admitted on 01/08/2021.   Difficult disposition due to homelessness, unable to return to shelter, disability application pending   Assessment & Plan:   Principal Problem:   Influenza A with pneumonia Active Problems:   Morbid obesity with BMI of 50.0-59.9, adult (HCC)   Pressure ulcer of buttock   Essential hypertension   Acute respiratory failure with hypoxemia (HCC)   Obesity hypoventilation syndrome (HCC)   Chronic diastolic CHF (congestive heart failure) (HCC)   Cellulitis and abscess of buttock   Prediabetes   Homelessness   Hypokalemia   Sepsis without acute organ dysfunction (HCC)   Nocturnal hypoxia  #1 acute respiratory failure with hypoxia, POA secondary to influenza A/pneumonia -Patient with clinical improvement. -Status post full course of Tamiflu. -Improving clinically, initial pulse ox was 80% on room air currently has been weaned off oxygen during the day. -Supportive care.  2.  Nocturnal hypoxia -Patient noted to qualify for nocturnal oxygen. -Would likely need outpatient sleep study done. -Continue oxygen supplementation overnight.  3.  Influenza A/pneumonia with sepsis, POA -Resolved. -Sepsis physiology resolved. -Status post full course Tamiflu.  4.  Obesity hypoventilation syndrome -Patient noted to initially required BiPAP however subsequently been weaned off BiPAP and currently on supplemental O2 at bedtime. -We will need outpatient sleep study done after discharge.  5.  Chronic CHF diastolic,  POA -Currently euvolemic. -Continue current dose of torsemide twice daily.  6.  Hypokalemia -Secondary to diuretics. -Continue daily potassium supplementation.  7.  Hypertension -Stable on torsemide.  8.  Depression -Zoloft.  9.  Cellulitis with abscess of the buttocks -Resolved. -Status post full course of Augmentin.  10.  Morbid obesity -BMI 56.53 kg per metered squared. -Lifestyle modifications recommended including physical activity, and diet for weight loss. -Outpatient follow-up with PCP.  11.  Pressure injuries of the skin, POA -Continue current wound care. Pressure Injury 10/09/20 Buttocks Right Unstageable - Full thickness tissue loss in which the base of the injury is covered by slough (yellow, tan, gray, green or brown) and/or eschar (tan, brown or black) in the wound bed. (Active)  10/09/20 1933  Location: Buttocks  Location Orientation: Right  Staging: Unstageable - Full thickness tissue loss in which the base of the injury is covered by slough (yellow, tan, gray, green or brown) and/or eschar (tan, brown or black) in the wound bed.  Wound Description (Comments):   Present on Admission: Yes     Pressure Injury 10/22/20 Thigh Posterior;Proximal;Right Stage 3 -  Full thickness tissue loss. Subcutaneous fat may be visible but bone, tendon or muscle are NOT exposed. (Active)  10/22/20 1055  Location: Thigh  Location Orientation: Posterior;Proximal;Right  Staging: Stage 3 -  Full thickness tissue loss. Subcutaneous fat may be visible but bone, tendon or muscle are NOT exposed.  Wound Description (Comments):   Present on Admission: Yes (per handoff and report from other RN on unit- wound present on presentation to floor, patient reports wound present on admission- WOCN consulted)   #12: Homelessness -Patient being followed by [facility], homeless shelter not able  to take patient back at this time. -Per TOC.      DVT prophylaxis: Lovenox Code Status: Full Family  Communication: Updated patient.  No family at bedside. Disposition:   Status is: Inpatient  Remains inpatient appropriate because: Unsafe discharge plan       Consultants:  [place], Phineas Douglas, RN 01/09/2021  Procedures:  CT angiogram chest 01/09/2021 CT abdomen and pelvis 01/08/2021 CT right femur 01/08/2021 Chest x-ray 01/08/2021 Lower extremity Dopplers 01/09/2021  Antimicrobials:  Anti-infectives (From admission, onward)    Start     Dose/Rate Route Frequency Ordered Stop   01/11/21 2200  oseltamivir (TAMIFLU) capsule 75 mg        75 mg Oral 2 times daily 01/11/21 1317 01/13/21 1041   01/10/21 1030  amoxicillin-clavulanate (AUGMENTIN) 875-125 MG per tablet 1 tablet        1 tablet Oral Every 12 hours 01/10/21 0943 01/14/21 2054   01/09/21 1000  metroNIDAZOLE (FLAGYL) IVPB 500 mg  Status:  Discontinued        500 mg 100 mL/hr over 60 Minutes Intravenous Every 12 hours 01/08/21 2330 01/09/21 0813   01/09/21 1000  oseltamivir (TAMIFLU) capsule 75 mg  Status:  Discontinued        75 mg Oral 2 times daily 01/08/21 2330 01/11/21 1317   01/09/21 0815  ceFAZolin (ANCEF) IVPB 2g/100 mL premix  Status:  Discontinued        2 g 200 mL/hr over 30 Minutes Intravenous Every 8 hours 01/09/21 0809 01/10/21 0943   01/09/21 0800  ceFEPIme (MAXIPIME) 2 g in sodium chloride 0.9 % 100 mL IVPB  Status:  Discontinued        2 g 200 mL/hr over 30 Minutes Intravenous Every 8 hours 01/09/21 0655 01/09/21 0813   01/09/21 0700  vancomycin (VANCOREADY) IVPB 2000 mg/400 mL  Status:  Discontinued        2,000 mg 200 mL/hr over 120 Minutes Intravenous Every 12 hours 01/09/21 0045 01/09/21 0813   01/09/21 0030  vancomycin (VANCOREADY) IVPB 1500 mg/300 mL        1,500 mg 150 mL/hr over 120 Minutes Intravenous  Once 01/09/21 0029 01/09/21 0230   01/08/21 2345  ceFEPIme (MAXIPIME) 2 g in sodium chloride 0.9 % 100 mL IVPB  Status:  Discontinued        2 g 200 mL/hr over 30 Minutes Intravenous Every 24  hours 01/08/21 2330 01/09/21 0655   01/08/21 1915  oseltamivir (TAMIFLU) capsule 75 mg  Status:  Discontinued        75 mg Oral 2 times daily 01/08/21 1910 01/08/21 2330   01/08/21 1815  ceFEPIme (MAXIPIME) 2 g in sodium chloride 0.9 % 100 mL IVPB        2 g 200 mL/hr over 30 Minutes Intravenous  Once 01/08/21 1809 01/08/21 1902   01/08/21 1815  metroNIDAZOLE (FLAGYL) IVPB 500 mg        500 mg 100 mL/hr over 60 Minutes Intravenous  Once 01/08/21 1809 01/08/21 1930   01/08/21 1815  vancomycin (VANCOCIN) IVPB 1000 mg/200 mL premix        1,000 mg 200 mL/hr over 60 Minutes Intravenous  Once 01/08/21 1809 01/08/21 2006         Subjective: Sitting up in chair.  Denies any significant shortness of breath.  No chest pain.  Overall feeling better than on admission.  Sats of 97% on room air.  Objective: Vitals:   01/31/21 0400 01/31/21 0400 01/31/21 0800 01/31/21  1142  BP: 131/81 131/81 127/72 108/65  Pulse: 72 71 68 75  Resp: 18 18 18  (!) 8  Temp: 98.1 F (36.7 C) 98.1 F (36.7 C) 98.1 F (36.7 C) 98.1 F (36.7 C)  TempSrc:      SpO2: 95% 95% 95% 97%  Weight:      Height:        Intake/Output Summary (Last 24 hours) at 01/31/2021 1405 Last data filed at 01/31/2021 1336 Gross per 24 hour  Intake 1320 ml  Output 1650 ml  Net -330 ml   Filed Weights   01/08/21 1759 01/25/21 1348 01/30/21 0500  Weight: (!) 209.7 kg (!) 196 kg (!) 194.4 kg    Examination:  General exam: Appears calm and comfortable  Respiratory system: Clear to auscultation bilaterally..  No wheezing noted.  Fair air movement.  Speaking in full sentences.  Respiratory effort normal. Cardiovascular system: Distant heart sounds due to body habitus.  S1 & S2 heard, RRR. No JVD, murmurs, rubs, gallops or clicks. No pedal edema. Gastrointestinal system: Abdomen is nondistended, soft and nontender. No organomegaly or masses felt. Normal bowel sounds heard. Central nervous system: Alert and oriented. No focal  neurological deficits. Extremities: Symmetric 5 x 5 power. Skin: No rashes, lesions or ulcers Psychiatry: Judgement and insight appear normal. Mood & affect appropriate.     Data Reviewed: I have personally reviewed following labs and imaging studies  CBC: Recent Labs  Lab 01/27/21 0420  WBC 8.0  HGB 12.2*  HCT 39.2  MCV 80.0  PLT 374    Basic Metabolic Panel: Recent Labs  Lab 01/25/21 0430 01/26/21 0245 01/27/21 0420 01/28/21 0554 01/30/21 0645  NA 139 137 136 139 135  K 3.4* 3.5 3.3* 3.4* 3.6  CL 99 98 97* 97* 95*  CO2 34* 31 32 34* 33*  GLUCOSE 99 86 102* 94 95  BUN 17 16 14 16 15   CREATININE 0.88 0.84 0.84 1.04 0.73  CALCIUM 8.6* 8.5* 8.5* 8.8* 8.5*  MG 2.2 2.2  --   --   --     GFR: Estimated Creatinine Clearance: 205.1 mL/min (by C-G formula based on SCr of 0.73 mg/dL).  Liver Function Tests: No results for input(s): AST, ALT, ALKPHOS, BILITOT, PROT, ALBUMIN in the last 168 hours.  CBG: No results for input(s): GLUCAP in the last 168 hours.   No results found for this or any previous visit (from the past 240 hour(s)).       Radiology Studies: No results found.      Scheduled Meds:  docusate sodium  100 mg Oral BID   enoxaparin (LOVENOX) injection  0.5 mg/kg Subcutaneous Q24H   fluticasone  2 spray Each Nare Daily   guaiFENesin  15 mL Oral QID   potassium chloride  40 mEq Oral Daily   sertraline  50 mg Oral Daily   torsemide  20 mg Oral BID   Continuous Infusions:   LOS: 23 days    Time spent: 35 minutes    14/03/22, MD [facility]   To contact the attending provider between 7A-7P or the covering provider during after hours 7P-7A, please log into the web site [email or web address] and access using universal [place] password for that web site. If you do not have the password, please call the hospital operator.  01/31/2021, 2:05 PM

## 2021-02-01 DIAGNOSIS — J101 Influenza due to other identified influenza virus with other respiratory manifestations: Secondary | ICD-10-CM

## 2021-02-01 LAB — BASIC METABOLIC PANEL
Anion gap: 4 — ABNORMAL LOW (ref 5–15)
BUN: 13 mg/dL (ref 6–20)
CO2: 35 mmol/L — ABNORMAL HIGH (ref 22–32)
Calcium: 8.5 mg/dL — ABNORMAL LOW (ref 8.9–10.3)
Chloride: 98 mmol/L (ref 98–111)
Creatinine, Ser: 0.8 mg/dL (ref 0.61–1.24)
GFR, Estimated: >60 mL/min (ref >60)
Glucose, Bld: 100 mg/dL — ABNORMAL HIGH (ref 70–99)
Potassium: 3.3 mmol/L — ABNORMAL LOW (ref 3.5–5.1)
Sodium: 137 mmol/L (ref 135–145)

## 2021-02-01 MED ORDER — POTASSIUM CHLORIDE CRYS ER 20 MEQ PO TBCR
40.0000 meq | EXTENDED_RELEASE_TABLET | Freq: Two times a day (BID) | ORAL | Status: DC
  Administered 2021-02-01 (×2): 40 meq via ORAL
  Filled 2021-02-01: qty 4
  Filled 2021-02-01: qty 2

## 2021-02-01 NOTE — Progress Notes (Signed)
Physical Therapy Treatment Patient Details Name: Christian [name] MRN: [id number] DOB: December 28, 1975 Today's Date: 02/01/2021   History of Present Illness Pt is a 45 y/o M admitted on 01/08/21 with c/c of SOB. Pt noted to be positive for Influenza A. Pt noted to have cellulitic area to R buttock. Imaging reveals multifocal PNA in the R lung. PMH: morbid obesity, HTN    PT Comments    Pt seen for mobility this pm. Able to stand from recliner without assist of Ue's. 94% at rest on RA. Gait training on level surface without rest break with support of RW ~271ft and supervision for safety. Pt without c/o SOB, slight c/o fatigue, O2 sats no lower than 90% upon exertion. Pt appears to be improving functionally. Continue PT per POC until d/c.   Recommendations for follow up therapy are one component of a multi-disciplinary discharge planning process, led by the attending physician.  Recommendations may be updated based on patient status, additional functional criteria and insurance authorization.  Follow Up Recommendations  Outpatient PT     Assistance Recommended at Discharge Set up Supervision/Assistance  Equipment Recommendations  None recommended by PT    Recommendations for Other Services       Precautions / Restrictions Precautions Precautions: Fall Restrictions Weight Bearing Restrictions: No     Mobility  Bed Mobility               General bed mobility comments: not assessed; pt in recliner pre/post session    Transfers Overall transfer level: Needs assistance Equipment used: Rolling walker (2 wheels) Transfers: Sit to/from Stand Sit to Stand: Supervision           General transfer comment:  (Sit to stand x 3 from chair)    Ambulation/Gait Ambulation/Gait assistance: Supervision Gait Distance (Feet):  (200) Assistive device: Rolling walker (2 wheels) Gait Pattern/deviations: Step-through pattern       General Gait Details:  (RA with O2 sats remaining  above 90% on RA)   Stairs             Wheelchair Mobility    Modified Rankin (Stroke Patients Only)       Balance                                            Cognition Arousal/Alertness: Awake/alert Behavior During Therapy: WFL for tasks assessed/performed Overall Cognitive Status: Within Functional Limits for tasks assessed                                          Exercises      General Comments General comments (skin integrity, edema, etc.):  (Pt educated on safety awareness and energy conservation techniques)      Pertinent Vitals/Pain Pain Assessment: No/denies pain    Home Living                          Prior Function            PT Goals (current goals can now be found in the care plan section) Acute Rehab PT Goals Patient Stated Goal: find a safe place to live with power Progress towards PT goals: Progressing toward goals    Frequency    Min 2X/week  PT Plan Current plan remains appropriate    Co-evaluation              AM-PAC PT "6 Clicks" Mobility   Outcome Measure  Help needed turning from your back to your side while in a flat bed without using bedrails?: A Little Help needed moving from lying on your back to sitting on the side of a flat bed without using bedrails?: A Little Help needed moving to and from a bed to a chair (including a wheelchair)?: A Little Help needed standing up from a chair using your arms (e.g., wheelchair or bedside chair)?: None Help needed to walk in hospital room?: None Help needed climbing 3-5 steps with a railing? : A Little 6 Click Score: 20    End of Session Equipment Utilized During Treatment: Gait belt Activity Tolerance: Patient tolerated treatment well Patient left: in chair;with call bell/phone within reach Nurse Communication: Mobility status PT Visit Diagnosis: Muscle weakness (generalized) (M62.81)     Time: 3825-0539 PT Time  Calculation (min) (ACUTE ONLY): 15 min  Charges:  $Gait Training: 8-22 mins                    Zadie Cleverly, PTA    Jannet Askew 02/01/2021, 4:15 PM

## 2021-02-01 NOTE — Progress Notes (Signed)
Occupational Therapy Treatment Patient Details Name: Christian [name] MRN: [id number] DOB: 12/23/1975 Today's Date: 02/01/2021   History of present illness Pt is a 45 y/o M admitted on 01/08/21 with c/c of SOB. Pt noted to be positive for Influenza A. Pt noted to have cellulitic area to R buttock. Imaging reveals multifocal PNA in the R lung. PMH: morbid obesity, HTN   OT comments  Pt seen for OT treatment on this date. Upon arrival to room, pt awake and seated upright in recliner with pt reporting just going to bathroom for toileting and grooming/hygiene tasks. Pt agreeable to OT tx. Pt currently presents with decreased activity tolerance and strength. Due to these functional impairments, pt requires SUPERVISION/SET-UP to don/doff shoes while seated in recliner, SUPERVISION/SET-UP to don posterior hospital gown while standing, and SUPERVISION for functional mobility of household distances (264ft) with RW. Following functional mobility, pt reporting fatigue (HR 120 and SpO2 91% on RA), with HR and SpO2 returning to resting rates with cessation of activity. Pt is making good progress toward goals and continues to benefit from skilled OT services to maximize return to PLOF and minimize risk of future falls, injury, caregiver burden, and readmission. Will continue to follow POC. Discharge recommendation remains appropriate.     Recommendations for follow up therapy are one component of a multi-disciplinary discharge planning process, led by the attending physician.  Recommendations may be updated based on patient status, additional functional criteria and insurance authorization.    Follow Up Recommendations  Home health OT    Assistance Recommended at Discharge Intermittent Supervision/Assistance  Equipment Recommendations  BSC/3in1;Tub/shower seat       Precautions / Restrictions Precautions Precautions: Fall Restrictions Weight Bearing Restrictions: No       Mobility Bed Mobility                General bed mobility comments: not assessed; pt in recliner pre/post session    Transfers Overall transfer level: Needs assistance Equipment used: Rolling walker (2 wheels) Transfers: Sit to/from Stand Sit to Stand: Supervision           General transfer comment: no physical asisstance to stand or sit     Balance Overall balance assessment: Needs assistance Sitting-balance support: Feet supported;No upper extremity supported Sitting balance-[name]: Normal     Standing balance support: No upper extremity supported;During functional activity Standing balance-[name]: Good Standing balance comment: good standing balance during standing UB dressing                           ADL either performed or assessed with clinical judgement   ADL Overall ADL's : Needs assistance/impaired     Grooming: Wash/dry face;Set up;Sitting           Upper Body Dressing : Supervision/safety;Set up;Standing Upper Body Dressing Details (indicate cue type and reason): to don posterior hospital gown Lower Body Dressing: Supervision/safety;Set up;Sitting/lateral leans Lower Body Dressing Details (indicate cue type and reason): donning/doffing shoes             Functional mobility during ADLs: Supervision/safety;Rolling walker (2 wheels) (to walk 272ft)      Extremity/Trunk Assessment Upper Extremity Assessment Upper Extremity Assessment: Overall WFL for tasks assessed   Lower Extremity Assessment Lower Extremity Assessment: Generalized weakness         Cognition Arousal/Alertness: Awake/alert Behavior During Therapy: WFL for tasks assessed/performed Overall Cognitive Status: Within Functional Limits for tasks assessed  General Comments following functional mobility of household distances (212ft) with RW, pt reporting fatigue (HR 120 and SpO2 91% on RA). HR and SpO2 returning to  resting rates with cessation of activity    Pertinent Vitals/ Pain       Pain Assessment: No/denies pain         Frequency  Min 2X/week        Progress Toward Goals  OT Goals(current goals can now be found in the care plan section)  Progress towards OT goals: Progressing toward goals  Acute Rehab OT Goals Patient Stated Goal: to get better OT Goal Formulation: With patient Time For Goal Achievement: 02/09/21 Potential to Achieve Goals: Good  Plan Discharge plan remains appropriate;Frequency remains appropriate       AM-PAC OT "6 Clicks" Daily Activity     Outcome Measure   Help from another person eating meals?: None Help from another person taking care of personal grooming?: A Little Help from another person toileting, which includes using toliet, bedpan, or urinal?: A Little Help from another person bathing (including washing, rinsing, drying)?: A Little Help from another person to put on and taking off regular upper body clothing?: A Little Help from another person to put on and taking off regular lower body clothing?: A Little 6 Click Score: 19    End of Session Equipment Utilized During Treatment: Rolling walker (2 wheels)  OT Visit Diagnosis: Unsteadiness on feet (R26.81);Muscle weakness (generalized) (M62.81)   Activity Tolerance Patient tolerated treatment well   Patient Left in chair;with call bell/phone within reach   Nurse Communication Mobility status        Time: 4650-3546 OT Time Calculation (min): 23 min  Charges: OT General Charges $OT Visit: 1 Visit OT Treatments $Self Care/Home Management : 8-22 mins $Therapeutic Activity: 8-22 mins  Matthew Folks, OTR/L [facility] [phone]

## 2021-02-01 NOTE — Progress Notes (Signed)
PROGRESS NOTE    Christian [name]   [id number]  DOB: 05/02/1975  PCP: Pcp, No    DOA: 01/08/2021 LOS: 24    Brief Narrative / Hospital Course to Date:   45 year old male with morbid obesity and recent prolonged hospitalization complicated with perforated bowel status post operative repair, depression.  He presented with acute hypoxic respiratory failure and found to have influenza A pneumonia.    Long length of stay, admitted on 01/08/2021.   Difficult disposition due to homelessness, unable to return to shelter, disability application pending.  Assessment & Plan   Principal Problem:   Influenza A Active Problems:   Morbid obesity with BMI of 50.0-59.9, adult (HCC)   Pressure ulcer of buttock   Essential hypertension   Acute respiratory failure with hypoxemia (HCC)   Obesity hypoventilation syndrome (HCC)   Chronic diastolic CHF (congestive heart failure) (HCC)   Cellulitis and abscess of buttock   Prediabetes   Homelessness   Hypokalemia   Sepsis without acute organ dysfunction (HCC)   Nocturnal hypoxia   Acute respiratory failure with hypoxia -present on admission due to influenza A pneumonia., Resolved.  Initial pulse ox was 80% on room air, now weaned off oxygen during the day.  Nocturnal hypoxia -see overnight oximetry results in paper chart.   Patient qualifies for nocturnal oxygen.  Supplement O2 overnight.  Influenza A pneumonia with sepsis -present on admission, resolved.   Completed course of Tamiflu.   Respiratory status improved.   Sepsis physiology resolved.  Obesity hypoventilation syndrome -patient initially required BiPAP, has been weaned off.   Needs outpatient sleep study after discharge.  Cellulitis with abscess of buttock -resolved.  Completed course of Augmentin.  Chronic diastolic CHF -stable volume status.  Continue torsemide twice daily.  Hypokalemia -likely due to torsemide.  On daily potassium supplementation.  Monitor BMP  occasionally, daily labs not required at this point.  Essential hypertension -BP stable on torsemide, continue  Depression -continue Zoloft  Homelessness -Per TOC, homeless shelter not able to take the patient back.  TOC working on placement.  Pt requires nocturnal oxygen at d/c.   Morbid obesity: Body mass index is 56.53 kg/m.  Complicates overall care and prognosis.  Recommend lifestyle modifications including physical activity and diet for weight loss and overall long-term health.   Pressure Injuries of Skin - continue wound care. --Home health RN requested --Wound care clinic follow up after d/c  I agree with description and assessment of wounds as follows: Pressure Injury 10/09/20 Buttocks Right Unstageable - Full thickness tissue loss in which the base of the injury is covered by slough (yellow, tan, gray, green or brown) and/or eschar (tan, brown or black) in the wound bed. (Active)  10/09/20 1933  Location: Buttocks  Location Orientation: Right  Staging: Unstageable - Full thickness tissue loss in which the base of the injury is covered by slough (yellow, tan, gray, green or brown) and/or eschar (tan, brown or black) in the wound bed.  Wound Description (Comments):   Present on Admission: Yes     Pressure Injury 10/22/20 Thigh Posterior;Proximal;Right Stage 3 -  Full thickness tissue loss. Subcutaneous fat may be visible but bone, tendon or muscle are NOT exposed. (Active)  10/22/20 1055  Location: Thigh  Location Orientation: Posterior;Proximal;Right  Staging: Stage 3 -  Full thickness tissue loss. Subcutaneous fat may be visible but bone, tendon or muscle are NOT exposed.  Wound Description (Comments):   Present on Admission: Yes (per handoff and report from  other RN on unit- wound present on presentation to floor, patient reports wound present on admission- WOCN consulted)     DVT prophylaxis: SCDs Start: 01/08/21 2330   Diet:  Diet Orders (From admission, onward)      Start     Ordered   01/18/21 0000  Diet - low sodium heart healthy        01/18/21 1306   01/14/21 0748  Diet Heart Room service appropriate? Yes; Fluid consistency: Thin  Diet effective now       Question Answer Comment  Room service appropriate? Yes   Fluid consistency: Thin      01/14/21 0747              Code Status: Full Code   Subjective 02/01/21    Patient sleeping in recliner this AM.  Wakes just briefly.  No acute complaints or acute events reported.    Disposition Plan & Communication   Status is: Inpatient  Remains inpatient appropriate because: Homelessness, unsafe discharge, requires oxygen supplementation and unable to return to shelter.  TOC working on placement.   Consults, Procedures, Significant Events   Consultants:  WOC RN  Procedures:  None  Antimicrobials:  Anti-infectives (From admission, onward)    Start     Dose/Rate Route Frequency Ordered Stop   01/11/21 2200  oseltamivir (TAMIFLU) capsule 75 mg        75 mg Oral 2 times daily 01/11/21 1317 01/13/21 1041   01/10/21 1030  amoxicillin-clavulanate (AUGMENTIN) 875-125 MG per tablet 1 tablet        1 tablet Oral Every 12 hours 01/10/21 0943 01/14/21 2054   01/09/21 1000  metroNIDAZOLE (FLAGYL) IVPB 500 mg  Status:  Discontinued        500 mg 100 mL/hr over 60 Minutes Intravenous Every 12 hours 01/08/21 2330 01/09/21 0813   01/09/21 1000  oseltamivir (TAMIFLU) capsule 75 mg  Status:  Discontinued        75 mg Oral 2 times daily 01/08/21 2330 01/11/21 1317   01/09/21 0815  ceFAZolin (ANCEF) IVPB 2g/100 mL premix  Status:  Discontinued        2 g 200 mL/hr over 30 Minutes Intravenous Every 8 hours 01/09/21 0809 01/10/21 0943   01/09/21 0800  ceFEPIme (MAXIPIME) 2 g in sodium chloride 0.9 % 100 mL IVPB  Status:  Discontinued        2 g 200 mL/hr over 30 Minutes Intravenous Every 8 hours 01/09/21 0655 01/09/21 0813   01/09/21 0700  vancomycin (VANCOREADY) IVPB 2000 mg/400 mL  Status:   Discontinued        2,000 mg 200 mL/hr over 120 Minutes Intravenous Every 12 hours 01/09/21 0045 01/09/21 0813   01/09/21 0030  vancomycin (VANCOREADY) IVPB 1500 mg/300 mL        1,500 mg 150 mL/hr over 120 Minutes Intravenous  Once 01/09/21 0029 01/09/21 0230   01/08/21 2345  ceFEPIme (MAXIPIME) 2 g in sodium chloride 0.9 % 100 mL IVPB  Status:  Discontinued        2 g 200 mL/hr over 30 Minutes Intravenous Every 24 hours 01/08/21 2330 01/09/21 0655   01/08/21 1915  oseltamivir (TAMIFLU) capsule 75 mg  Status:  Discontinued        75 mg Oral 2 times daily 01/08/21 1910 01/08/21 2330   01/08/21 1815  ceFEPIme (MAXIPIME) 2 g in sodium chloride 0.9 % 100 mL IVPB        2 g 200 mL/hr  over 30 Minutes Intravenous  Once 01/08/21 1809 01/08/21 1902   01/08/21 1815  metroNIDAZOLE (FLAGYL) IVPB 500 mg        500 mg 100 mL/hr over 60 Minutes Intravenous  Once 01/08/21 1809 01/08/21 1930   01/08/21 1815  vancomycin (VANCOCIN) IVPB 1000 mg/200 mL premix        1,000 mg 200 mL/hr over 60 Minutes Intravenous  Once 01/08/21 1809 01/08/21 2006         Micro    Objective   Vitals:   02/01/21 0045 02/01/21 0725 02/01/21 1215 02/01/21 1512  BP: 124/73 118/76 122/74 129/88  Pulse: 71 72 76 84  Resp: 18 18 18 18   Temp: 97.7 F (36.5 C) 98.2 F (36.8 C) 98.1 F (36.7 C) 98.1 F (36.7 C)  TempSrc: Oral  Oral   SpO2: 95% 96% 96% 92%  Weight:      Height:        Intake/Output Summary (Last 24 hours) at 02/01/2021 1615 Last data filed at 02/01/2021 1354 Gross per 24 hour  Intake 1440 ml  Output 2700 ml  Net -1260 ml   Filed Weights   01/08/21 1759 01/25/21 1348 01/30/21 0500  Weight: (!) 209.7 kg (!) 196 kg (!) 194.4 kg    Physical Exam:  General exam: sleeping in recliner, no acute distress, morbidly obese Respiratory system: on room air, symmetric chest rise, normal respiratory effort. Cardiovascular system: RRR, no pedal edema.   Central nervous system: grossly non-focal  exam  Labs   Data Reviewed: I have personally reviewed following labs and imaging studies  CBC: Recent Labs  Lab 01/27/21 0420  WBC 8.0  HGB 12.2*  HCT 39.2  MCV 80.0  PLT 374   Basic Metabolic Panel: Recent Labs  Lab 01/26/21 0245 01/27/21 0420 01/28/21 0554 01/30/21 0645 02/01/21 0652  NA 137 136 139 135 137  K 3.5 3.3* 3.4* 3.6 3.3*  CL 98 97* 97* 95* 98  CO2 31 32 34* 33* 35*  GLUCOSE 86 102* 94 95 100*  BUN 16 14 16 15 13   CREATININE 0.84 0.84 1.04 0.73 0.80  CALCIUM 8.5* 8.5* 8.8* 8.5* 8.5*  MG 2.2  --   --   --   --    GFR: Estimated Creatinine Clearance: 205.1 mL/min (by C-G formula based on SCr of 0.8 mg/dL). Liver Function Tests: No results for input(s): AST, ALT, ALKPHOS, BILITOT, PROT, ALBUMIN in the last 168 hours. No results for input(s): LIPASE, AMYLASE in the last 168 hours. No results for input(s): AMMONIA in the last 168 hours. Coagulation Profile: No results for input(s): INR, PROTIME in the last 168 hours. Cardiac Enzymes: No results for input(s): CKTOTAL, CKMB, CKMBINDEX, TROPONINI in the last 168 hours. BNP (last 3 results) No results for input(s): PROBNP in the last 8760 hours. HbA1C: No results for input(s): HGBA1C in the last 72 hours. CBG: No results for input(s): GLUCAP in the last 168 hours. Lipid Profile: No results for input(s): CHOL, HDL, LDLCALC, TRIG, CHOLHDL, LDLDIRECT in the last 72 hours. Thyroid Function Tests: No results for input(s): TSH, T4TOTAL, FREET4, T3FREE, THYROIDAB in the last 72 hours. Anemia Panel: No results for input(s): VITAMINB12, FOLATE, FERRITIN, TIBC, IRON, RETICCTPCT in the last 72 hours. Sepsis Labs: No results for input(s): PROCALCITON, LATICACIDVEN in the last 168 hours.  No results found for this or any previous visit (from the past 240 hour(s)).    Imaging Studies   No results found.   Medications  Scheduled Meds:  docusate sodium  100 mg Oral BID   enoxaparin (LOVENOX) injection  0.5  mg/kg Subcutaneous Q24H   fluticasone  2 spray Each Nare Daily   guaiFENesin  15 mL Oral QID   potassium chloride  40 mEq Oral BID   sertraline  50 mg Oral Daily   torsemide  20 mg Oral BID   Continuous Infusions:     LOS: 24 days    Time spent: 20 minutes     Ezekiel Slocumb, DO [facility]  02/01/2021, 4:15 PM      If 7PM-7AM, please contact night-coverage. How to contact the [facility] Attending or Consulting provider [place] or covering provider during after hours [place], for this patient?    Check the care team in [facility] and look for a) attending/consulting [facility] provider listed and b) the [facility] team listed Log into [email or web address] and use [place]'s universal password to access. If you do not have the password, please contact the hospital operator. Locate the [facility] provider you are looking for under [facility] and page to a number that you can be directly reached. If you still have difficulty reaching the provider, please page the [facility] (Director on Call) for the Hospitalists listed on amion for assistance.

## 2021-02-02 MED ORDER — CAMPHOR-MENTHOL 0.5-0.5 % EX LOTN
TOPICAL_LOTION | CUTANEOUS | Status: DC | PRN
  Filled 2021-02-02: qty 222

## 2021-02-02 MED ORDER — CETAPHIL MOISTURIZING EX LOTN: TOPICAL_LOTION | Freq: Every day | CUTANEOUS | Status: DC

## 2021-02-02 MED ORDER — POTASSIUM CHLORIDE CRYS ER 20 MEQ PO TBCR
40.0000 meq | EXTENDED_RELEASE_TABLET | Freq: Three times a day (TID) | ORAL | Status: DC
  Administered 2021-02-02 – 2021-02-12 (×31): 40 meq via ORAL
  Filled 2021-02-02 (×31): qty 2

## 2021-02-02 NOTE — Progress Notes (Signed)
Physical Therapy Treatment Patient Details Name: Christian [name] MRN: [id number] DOB: 1976/01/02 Today's Date: 02/02/2021   History of Present Illness Pt is a 45 y/o M admitted on 01/08/21 with c/c of SOB. Pt noted to be positive for Influenza A. Pt noted to have cellulitic area to R buttock. Imaging reveals multifocal PNA in the R lung. PMH: morbid obesity, HTN    PT Comments    Pt seen this pm, received in recliner. Able to raise to standing without use of Ue's. Gait training on level surface 281ft with supervision with vc's for pacing and standing rest breaks. Pt negotiated 4 steps with support of B Rails and cues for technique. Pt O2 sats at 90% upon completion of stairs on RA, returning to resting baseline of 94% once session completed. Good tolerance for session. Productive coughing noted post session with good clearance. Continue PT per POC.    Recommendations for follow up therapy are one component of a multi-disciplinary discharge planning process, led by the attending physician.  Recommendations may be updated based on patient status, additional functional criteria and insurance authorization.  Follow Up Recommendations  Outpatient PT     Assistance Recommended at Discharge Set up Supervision/Assistance  Equipment Recommendations  None recommended by PT    Recommendations for Other Services       Precautions / Restrictions Precautions Precautions: Fall Restrictions Weight Bearing Restrictions: No     Mobility  Bed Mobility                    Transfers Overall transfer level: Needs assistance Equipment used: Rolling walker (2 wheels) Transfers: Sit to/from Stand Sit to Stand: Supervision           General transfer comment:  (Pt able to stand without use of UE's)    Ambulation/Gait Ambulation/Gait assistance: Supervision Gait Distance (Feet): 250 Feet Assistive device: Rolling walker (2 wheels) Gait Pattern/deviations: Step-through pattern            Stairs Stairs: Yes Stairs assistance: Min guard Stair Management: Two rails;Step to pattern;Forwards Number of Stairs: 4 General stair comments:  (cues for pacing)   Wheelchair Mobility    Modified Rankin (Stroke Patients Only)       Balance                                            Cognition Arousal/Alertness: Awake/alert Behavior During Therapy: WFL for tasks assessed/performed Overall Cognitive Status: Within Functional Limits for tasks assessed                                 General Comments: Pt is alert and oriented x4        Exercises      General Comments General comments (skin integrity, edema, etc.): Pt at 94% on RA at rest, desat to 90% post stairs with good recovery after standing rest break      Pertinent Vitals/Pain Pain Assessment: No/denies pain    Home Living                          Prior Function            PT Goals (current goals can now be found in the care plan section) Acute Rehab PT Goals Patient Stated  Goal: find a safe place to live with power    Frequency    Min 2X/week      PT Plan Current plan remains appropriate    Co-evaluation              AM-PAC PT "6 Clicks" Mobility   Outcome Measure  Help needed turning from your back to your side while in a flat bed without using bedrails?: A Little Help needed moving from lying on your back to sitting on the side of a flat bed without using bedrails?: A Little Help needed moving to and from a bed to a chair (including a wheelchair)?: A Little Help needed standing up from a chair using your arms (e.g., wheelchair or bedside chair)?: None Help needed to walk in hospital room?: None Help needed climbing 3-5 steps with a railing? : A Little 6 Click Score: 20    End of Session Equipment Utilized During Treatment: Gait belt Activity Tolerance: Patient tolerated treatment well Patient left: in chair;with call  bell/phone within reach Nurse Communication: Mobility status PT Visit Diagnosis: Muscle weakness (generalized) (M62.81)     Time: 6659-9357 PT Time Calculation (min) (ACUTE ONLY): 26 min  Charges:  $Gait Training: 23-37 mins                    Zadie Cleverly, PTA   Jannet Askew 02/02/2021, 4:42 PM

## 2021-02-02 NOTE — Progress Notes (Addendum)
PROGRESS NOTE    Christian [name]   [id number]  DOB: 1975-07-17  PCP: Pcp, No    DOA: 01/08/2021 LOS: 62    Brief Narrative / Hospital Course to Date:   45 year old male with morbid obesity and recent prolonged hospitalization complicated with perforated bowel status post operative repair, depression.  He presented with acute hypoxic respiratory failure and found to have influenza A pneumonia.    Long length of stay, admitted on 01/08/2021.   Difficult disposition due to homelessness, unable to return to shelter, disability application pending.  Assessment & Plan   Principal Problem:   Influenza A Active Problems:   Morbid obesity with BMI of 50.0-59.9, adult (HCC)   Pressure ulcer of buttock   Essential hypertension   Acute respiratory failure with hypoxemia (HCC)   Obesity hypoventilation syndrome (HCC)   Chronic diastolic CHF (congestive heart failure) (HCC)   Cellulitis and abscess of buttock   Prediabetes   Homelessness   Hypokalemia   Sepsis without acute organ dysfunction (HCC)   Nocturnal hypoxia   Acute respiratory failure with hypoxia -present on admission due to influenza A pneumonia., Resolved.  Initial pulse ox was 80% on room air, now weaned off oxygen during the day.  Nocturnal hypoxia -see overnight oximetry results in paper chart.   Patient qualifies for nocturnal oxygen.  Supplement O2 overnight.  Influenza A pneumonia with sepsis -present on admission, resolved.   Completed course of Tamiflu.   Respiratory status improved.   Sepsis physiology resolved.  Obesity hypoventilation syndrome -patient initially required BiPAP, has been weaned off.   Needs outpatient sleep study after discharge.  Cellulitis with abscess of buttock -resolved.  Completed course of Augmentin.  Chronic diastolic CHF -stable volume status.  Continue torsemide twice daily.  Hypokalemia -likely due to torsemide.  On daily potassium supplementation.   Monitor BMP   12/6: K has been borderline and low with 40 mEq BID, increased to TID for now.  Essential hypertension -BP stable on torsemide, continue  Depression -continue Zoloft  Homelessness -Per TOC, homeless shelter not able to take the patient back.  TOC working on placement.  Pt requires nocturnal oxygen at d/c.   Morbid obesity: Body mass index is 56.53 kg/m.  Complicates overall care and prognosis.  Recommend lifestyle modifications including physical activity and diet for weight loss and overall long-term health.   Pressure Injuries of Skin - continue wound care. --Home health RN requested --Wound care clinic follow up after d/c  I agree with description and assessment of wounds as follows: Pressure Injury 10/09/20 Buttocks Right Unstageable - Full thickness tissue loss in which the base of the injury is covered by slough (yellow, tan, gray, green or brown) and/or eschar (tan, brown or black) in the wound bed. (Active)  10/09/20 1933  Location: Buttocks  Location Orientation: Right  Staging: Unstageable - Full thickness tissue loss in which the base of the injury is covered by slough (yellow, tan, gray, green or brown) and/or eschar (tan, brown or black) in the wound bed.  Wound Description (Comments):   Present on Admission: Yes     Pressure Injury 10/22/20 Thigh Posterior;Proximal;Right Stage 3 -  Full thickness tissue loss. Subcutaneous fat may be visible but bone, tendon or muscle are NOT exposed. (Active)  10/22/20 1055  Location: Thigh  Location Orientation: Posterior;Proximal;Right  Staging: Stage 3 -  Full thickness tissue loss. Subcutaneous fat may be visible but bone, tendon or muscle are NOT exposed.  Wound Description (Comments):  Present on Admission: Yes (per handoff and report from other RN on unit- wound present on presentation to floor, patient reports wound present on admission- WOCN consulted)     DVT prophylaxis: SCDs Start: 01/08/21 2330   Diet:  Diet  Orders (From admission, onward)     Start     Ordered   01/18/21 0000  Diet - low sodium heart healthy        01/18/21 1306   01/14/21 0748  Diet Heart Room service appropriate? Yes; Fluid consistency: Thin  Diet effective now       Question Answer Comment  Room service appropriate? Yes   Fluid consistency: Thin      01/14/21 0747              Code Status: Full Code   Subjective 02/02/21    Patient sitting up in recliner when seen this afternoon.  He reports overall feeling well.  Continues to have a mostly nonproductive cough.  No fevers or chills or shortness of breath.  He expresses some concern about intermittent blood from his posterior thigh wound.  Says dressing changes are being done daily and no concerns for infection or other problems, nurses have told him the wound appears to be healing well.   Disposition Plan & Communication   Status is: Inpatient  Remains inpatient appropriate because: Homelessness, unsafe discharge, requires oxygen supplementation and unable to return to shelter.  TOC working on placement.   Consults, Procedures, Significant Events   Consultants:  [place] RN  Procedures:  None  Antimicrobials:  Anti-infectives (From admission, onward)    Start     Dose/Rate Route Frequency Ordered Stop   01/11/21 2200  oseltamivir (TAMIFLU) capsule 75 mg        75 mg Oral 2 times daily 01/11/21 1317 01/13/21 1041   01/10/21 1030  amoxicillin-clavulanate (AUGMENTIN) 875-125 MG per tablet 1 tablet        1 tablet Oral Every 12 hours 01/10/21 0943 01/14/21 2054   01/09/21 1000  metroNIDAZOLE (FLAGYL) IVPB 500 mg  Status:  Discontinued        500 mg 100 mL/hr over 60 Minutes Intravenous Every 12 hours 01/08/21 2330 01/09/21 0813   01/09/21 1000  oseltamivir (TAMIFLU) capsule 75 mg  Status:  Discontinued        75 mg Oral 2 times daily 01/08/21 2330 01/11/21 1317   01/09/21 0815  ceFAZolin (ANCEF) IVPB 2g/100 mL premix  Status:  Discontinued        2 g 200  mL/hr over 30 Minutes Intravenous Every 8 hours 01/09/21 0809 01/10/21 0943   01/09/21 0800  ceFEPIme (MAXIPIME) 2 g in sodium chloride 0.9 % 100 mL IVPB  Status:  Discontinued        2 g 200 mL/hr over 30 Minutes Intravenous Every 8 hours 01/09/21 0655 01/09/21 0813   01/09/21 0700  vancomycin (VANCOREADY) IVPB 2000 mg/400 mL  Status:  Discontinued        2,000 mg 200 mL/hr over 120 Minutes Intravenous Every 12 hours 01/09/21 0045 01/09/21 0813   01/09/21 0030  vancomycin (VANCOREADY) IVPB 1500 mg/300 mL        1,500 mg 150 mL/hr over 120 Minutes Intravenous  Once 01/09/21 0029 01/09/21 0230   01/08/21 2345  ceFEPIme (MAXIPIME) 2 g in sodium chloride 0.9 % 100 mL IVPB  Status:  Discontinued        2 g 200 mL/hr over 30 Minutes Intravenous Every 24 hours 01/08/21  2330 01/09/21 0655   01/08/21 1915  oseltamivir (TAMIFLU) capsule 75 mg  Status:  Discontinued        75 mg Oral 2 times daily 01/08/21 1910 01/08/21 2330   01/08/21 1815  ceFEPIme (MAXIPIME) 2 g in sodium chloride 0.9 % 100 mL IVPB        2 g 200 mL/hr over 30 Minutes Intravenous  Once 01/08/21 1809 01/08/21 1902   01/08/21 1815  metroNIDAZOLE (FLAGYL) IVPB 500 mg        500 mg 100 mL/hr over 60 Minutes Intravenous  Once 01/08/21 1809 01/08/21 1930   01/08/21 1815  vancomycin (VANCOCIN) IVPB 1000 mg/200 mL premix        1,000 mg 200 mL/hr over 60 Minutes Intravenous  Once 01/08/21 1809 01/08/21 2006         Micro    Objective   Vitals:   02/02/21 0049 02/02/21 0449 02/02/21 0723 02/02/21 1116  BP: 118/83 128/77 127/87 125/68  Pulse: 73 75 73 73  Resp: 16 18    Temp: 97.7 F (36.5 C) 97.8 F (36.6 C) (!) 97.4 F (36.3 C) 98 F (36.7 C)  TempSrc: Oral   Oral  SpO2: 95% 93% 99% 94%  Weight:      Height:        Intake/Output Summary (Last 24 hours) at 02/02/2021 1524 Last data filed at 02/02/2021 1350 Gross per 24 hour  Intake 1320 ml  Output 1100 ml  Net 220 ml   Filed Weights   01/08/21 1759 01/25/21  1348 01/30/21 0500  Weight: (!) 209.7 kg (!) 196 kg (!) 194.4 kg    Physical Exam:  General exam: Awake and alert, up in recliner, no acute distress, morbidly obese Respiratory system: on room air, symmetric chest rise, normal respiratory effort. Cardiovascular system: Regular rate and rhythm, trace lower extremity edema.   Central nervous system: Alert and oriented x3, normal speech, grossly nonfocal exam Extremities: Moves all, normal tone, distal lower extremities with venous stasis and mild dependent edema  Labs   Data Reviewed: I have personally reviewed following labs and imaging studies  CBC: Recent Labs  Lab 01/27/21 0420  WBC 8.0  HGB 12.2*  HCT 39.2  MCV 80.0  PLT [id number]   Basic Metabolic Panel: Recent Labs  Lab 01/27/21 0420 01/28/21 0554 01/30/21 0645 02/01/21 0652  NA 136 139 135 137  K 3.3* 3.4* 3.6 3.3*  CL 97* 97* 95* 98  CO2 32 34* 33* 35*  GLUCOSE 102* 94 95 100*  BUN 14 16 15 13   CREATININE 0.84 1.04 0.73 0.80  CALCIUM 8.5* 8.8* 8.5* 8.5*   GFR: Estimated Creatinine Clearance: 205.1 mL/min (by C-G formula based on SCr of 0.8 mg/dL). Liver Function Tests: No results for input(s): AST, ALT, ALKPHOS, BILITOT, PROT, ALBUMIN in the last 168 hours. No results for input(s): LIPASE, AMYLASE in the last 168 hours. No results for input(s): AMMONIA in the last 168 hours. Coagulation Profile: No results for input(s): INR, PROTIME in the last 168 hours. Cardiac Enzymes: No results for input(s): CKTOTAL, CKMB, CKMBINDEX, TROPONINI in the last 168 hours. BNP (last 3 results) No results for input(s): PROBNP in the last 8760 hours. HbA1C: No results for input(s): HGBA1C in the last 72 hours. CBG: No results for input(s): GLUCAP in the last 168 hours. Lipid Profile: No results for input(s): CHOL, HDL, LDLCALC, TRIG, CHOLHDL, LDLDIRECT in the last 72 hours. Thyroid Function Tests: No results for input(s): TSH, T4TOTAL, FREET4, T3FREE,  THYROIDAB in the last 72  hours. Anemia Panel: No results for input(s): VITAMINB12, FOLATE, FERRITIN, TIBC, IRON, RETICCTPCT in the last 72 hours. Sepsis Labs: No results for input(s): PROCALCITON, LATICACIDVEN in the last 168 hours.  No results found for this or any previous visit (from the past 240 hour(s)).    Imaging Studies   No results found.   Medications   Scheduled Meds:  docusate sodium  100 mg Oral BID   enoxaparin (LOVENOX) injection  0.5 mg/kg Subcutaneous Q24H   fluticasone  2 spray Each Nare Daily   guaiFENesin  15 mL Oral QID   potassium chloride  40 mEq Oral TID   sertraline  50 mg Oral Daily   torsemide  20 mg Oral BID   Continuous Infusions:     LOS: 25 days    Time spent: 20 minutes     Pennie Banter, DO [facility]  02/02/2021, 3:24 PM      If 7PM-7AM, please contact night-coverage. How to contact the [facility] Attending or Consulting provider 7A - 7P or covering provider during after hours 7P -7A, for this patient?    Check the care team in [facility] and look for a) attending/consulting [facility] provider listed and b) the [facility] team listed Log into [email or web address] and use [place]'s universal password to access. If you do not have the password, please contact the hospital operator. Locate the [facility] provider you are looking for under [facility] and page to a number that you can be directly reached. If you still have difficulty reaching the provider, please page the [facility] (Director on Call) for the Hospitalists listed on amion for assistance.

## 2021-02-03 ENCOUNTER — Encounter: Payer: Self-pay | Admitting: Internal Medicine

## 2021-02-03 LAB — BASIC METABOLIC PANEL
Anion gap: 7 (ref 5–15)
BUN: 15 mg/dL (ref 6–20)
CO2: 33 mmol/L — ABNORMAL HIGH (ref 22–32)
Calcium: 8.7 mg/dL — ABNORMAL LOW (ref 8.9–10.3)
Chloride: 97 mmol/L — ABNORMAL LOW (ref 98–111)
Creatinine, Ser: 0.86 mg/dL (ref 0.61–1.24)
GFR, Estimated: >60 mL/min (ref >60)
Glucose, Bld: 95 mg/dL (ref 70–99)
Potassium: 3.6 mmol/L (ref 3.5–5.1)
Sodium: 137 mmol/L (ref 135–145)

## 2021-02-03 NOTE — Progress Notes (Signed)
Physical Therapy Treatment Patient Details Name: Christian [name] MRN: [id number] DOB: Jun 05, 1975 Today's Date: 02/03/2021   History of Present Illness Pt is a 45 y/o M admitted on 01/08/21 with c/c of SOB. Pt noted to be positive for Influenza A. Pt noted to have cellulitic area to R buttock. Imaging reveals multifocal PNA in the R lung. PMH: morbid obesity, HTN    PT Comments    Pt was sitting in recliner upon arriving. He is agreeable to session and motivated throughout. He was easily able to stand, ambulate, and perform stairs with supervision. Overall, continues to be most limited by activity tolerance. He performed 5x STS test in 18.86 sec. Sao2 was greater than 92% on rm air however several standing rest breaks during session due to SOB/fatigue. Acute PT will continue to follow and progress as able per current POC.     Recommendations for follow up therapy are one component of a multi-disciplinary discharge planning process, led by the attending physician.  Recommendations may be updated based on patient status, additional functional criteria and insurance authorization.  Follow Up Recommendations  Outpatient PT     Assistance Recommended at Discharge Set up Supervision/Assistance  Equipment Recommendations  None recommended by PT       Precautions / Restrictions Precautions Precautions: Fall Restrictions Weight Bearing Restrictions: No     Mobility  Bed Mobility    General bed mobility comments: In recliner pre/post session    Transfers Overall transfer level: Needs assistance Equipment used: Rolling walker (2 wheels) Transfers: Sit to/from Stand Sit to Stand: Supervision           General transfer comment: pt performed STS 7 x throughout session without physical assistance. 5x STS without use of REs 18.86 sec.    Ambulation/Gait Ambulation/Gait assistance: Supervision Gait Distance (Feet): 400 Feet Assistive device: Rolling walker (2 wheels) Gait  Pattern/deviations: Step-through pattern Gait velocity: decreased         Stairs Stairs: Yes Stairs assistance: Supervision Stair Management: Two rails;Step to pattern;Forwards Number of Stairs: 5 General stair comments: Pt was able to ascend/ descend 5 stair with BUE support on rails. No LOB or difficulty. Did have to take standing rest due to fatigue.     Balance Overall balance assessment: Needs assistance Sitting-balance support: Feet supported;No upper extremity supported Sitting balance-[name]: Normal     Standing balance support: No upper extremity supported;During functional activity Standing balance-[name]: Good        Cognition Arousal/Alertness: Awake/alert Behavior During Therapy: WFL for tasks assessed/performed Overall Cognitive Status: Within Functional Limits for tasks assessed    General Comments: Pt is alert and oriented x4           General Comments General comments (skin integrity, edema, etc.): Pt was on rm air throughout session. Sao2 >92 %. did require 2 standing rest buring ambulation >400 ft      Pertinent Vitals/Pain Pain Assessment: No/denies pain Pain Score: 0-No pain     PT Goals (current goals can now be found in the care plan section) Acute Rehab PT Goals Patient Stated Goal: get stronger before I leave Progress towards PT goals: Progressing toward goals    Frequency    Min 2X/week      PT Plan Current plan remains appropriate       AM-PAC PT "6 Clicks" Mobility   Outcome Measure  Help needed turning from your back to your side while in a flat bed without using bedrails?: A Little Help needed  moving from lying on your back to sitting on the side of a flat bed without using bedrails?: A Little Help needed moving to and from a bed to a chair (including a wheelchair)?: A Little Help needed standing up from a chair using your arms (e.g., wheelchair or bedside chair)?: None Help needed to walk in hospital room?:  None Help needed climbing 3-5 steps with a railing? : A Little 6 Click Score: 20    End of Session Equipment Utilized During Treatment: Gait belt Activity Tolerance: Patient tolerated treatment well Patient left: in chair;with call bell/phone within reach Nurse Communication: Mobility status PT Visit Diagnosis: Muscle weakness (generalized) (M62.81)     Time: 4034-7425 PT Time Calculation (min) (ACUTE ONLY): 33 min  Charges:  $Gait Training: 23-37 mins                    Jetta Lout PTA 02/03/21, 10:47 AM

## 2021-02-03 NOTE — TOC Progression Note (Signed)
Transition of Care [facility]) - Progression Note    Patient Details  Name: Christian [name] MRN: [id number] Date of Birth: 04/10/1975  Transition of Care [facility]) CM/SW Contact  Maree Krabbe, LCSW Phone Number: 02/03/2021, 10:38 AM  Clinical Narrative:   [facility] department director is in contact with [facility] to see if they will take pt with 30 day LOG.    Expected Discharge Plan: Homeless Shelter Barriers to Discharge: Continued Medical Work up  Expected Discharge Plan and Services Expected Discharge Plan: Homeless Shelter In-house Referral: Clinical Social Work   Post Acute Care Choice: NA Living arrangements for the past 2 months: Homeless Shelter Expected Discharge Date: 01/18/21                                     Social Determinants of Health (SDOH) Interventions    Readmission Risk Interventions Readmission Risk Prevention Plan 10/11/2020  Post Dischage Appt Complete  Medication Screening Complete  Transportation Screening Complete  Some recent data might be hidden

## 2021-02-03 NOTE — Progress Notes (Signed)
Progress Note    Christian [name]  [id number] DOB: 1975/08/28  DOA: 01/08/2021 PCP: Pcp, No      Brief Narrative:    Medical records reviewed and are as summarized below:  Christian [name] is a 45 y.o. male significant for morbid obesity and recent prolonged hospitalization complicated with perforated bowel status post operative repair, depression.  He presented with acute hypoxic respiratory failure and found to have influenza A pneumonia.     Long length of stay, admitted on 01/08/2021.   Difficult disposition due to homelessness, unable to return to shelter, disability application pending      Assessment/Plan:   Principal Problem:   Influenza A Active Problems:   Morbid obesity with BMI of 50.0-59.9, adult (HCC)   Pressure ulcer of buttock   Essential hypertension   Acute respiratory failure with hypoxemia (HCC)   Obesity hypoventilation syndrome (HCC)   Chronic diastolic CHF (congestive heart failure) (HCC)   Cellulitis and abscess of buttock   Prediabetes   Homelessness   Hypokalemia   Sepsis without acute organ dysfunction (HCC)   Nocturnal hypoxia    Body mass index is 56.53 kg/m.  (Morbid obesity)   Nocturnal hypoxemia/suspected obesity hypoventilation syndrome: Continue supplemental oxygen at night.  Patient follow-up for sleep study strongly recommended.  Chronic diastolic CHF: Compensated: Continue torsemide  Hypokalemia: Continue potassium repletion  Sepsis secondary to influenza A pneumonia, acute hypoxemic respiratory failure: Resolved  Cellulitis right upper thigh/buttock: Resolved  Stage III pressure injury to right posterior thigh: Continue local wound care  Homelessness: Awaiting placement to SNF  Other comorbidities include depression, hypertension  Diet Order             Diet - low sodium heart healthy           Diet Heart Room service appropriate? Yes; Fluid consistency: Thin  Diet effective now                       Consultants: None  Procedures: None    Medications:    docusate sodium  100 mg Oral BID   enoxaparin (LOVENOX) injection  0.5 mg/kg Subcutaneous Q24H   fluticasone  2 spray Each Nare Daily   guaiFENesin  15 mL Oral QID   potassium chloride  40 mEq Oral TID   sertraline  50 mg Oral Daily   torsemide  20 mg Oral BID   Continuous Infusions:   Anti-infectives (From admission, onward)    Start     Dose/Rate Route Frequency Ordered Stop   01/11/21 2200  oseltamivir (TAMIFLU) capsule 75 mg        75 mg Oral 2 times daily 01/11/21 1317 01/13/21 1041   01/10/21 1030  amoxicillin-clavulanate (AUGMENTIN) 875-125 MG per tablet 1 tablet        1 tablet Oral Every 12 hours 01/10/21 0943 01/14/21 2054   01/09/21 1000  metroNIDAZOLE (FLAGYL) IVPB 500 mg  Status:  Discontinued        500 mg 100 mL/hr over 60 Minutes Intravenous Every 12 hours 01/08/21 2330 01/09/21 0813   01/09/21 1000  oseltamivir (TAMIFLU) capsule 75 mg  Status:  Discontinued        75 mg Oral 2 times daily 01/08/21 2330 01/11/21 1317   01/09/21 0815  ceFAZolin (ANCEF) IVPB 2g/100 mL premix  Status:  Discontinued        2 g 200 mL/hr over 30 Minutes Intravenous Every 8 hours 01/09/21 0809 01/10/21  [id number]   01/09/21 0800  ceFEPIme (MAXIPIME) 2 g in sodium chloride 0.9 % 100 mL IVPB  Status:  Discontinued        2 g 200 mL/hr over 30 Minutes Intravenous Every 8 hours 01/09/21 0655 01/09/21 0813   01/09/21 0700  vancomycin (VANCOREADY) IVPB 2000 mg/400 mL  Status:  Discontinued        2,000 mg 200 mL/hr over 120 Minutes Intravenous Every 12 hours 01/09/21 0045 01/09/21 0813   01/09/21 0030  vancomycin (VANCOREADY) IVPB 1500 mg/300 mL        1,500 mg 150 mL/hr over 120 Minutes Intravenous  Once 01/09/21 0029 01/09/21 0230   01/08/21 2345  ceFEPIme (MAXIPIME) 2 g in sodium chloride 0.9 % 100 mL IVPB  Status:  Discontinued        2 g 200 mL/hr over 30 Minutes Intravenous Every 24 hours 01/08/21 2330 01/09/21 0655    01/08/21 1915  oseltamivir (TAMIFLU) capsule 75 mg  Status:  Discontinued        75 mg Oral 2 times daily 01/08/21 1910 01/08/21 2330   01/08/21 1815  ceFEPIme (MAXIPIME) 2 g in sodium chloride 0.9 % 100 mL IVPB        2 g 200 mL/hr over 30 Minutes Intravenous  Once 01/08/21 1809 01/08/21 1902   01/08/21 1815  metroNIDAZOLE (FLAGYL) IVPB 500 mg        500 mg 100 mL/hr over 60 Minutes Intravenous  Once 01/08/21 1809 01/08/21 1930   01/08/21 1815  vancomycin (VANCOCIN) IVPB 1000 mg/200 mL premix        1,000 mg 200 mL/hr over 60 Minutes Intravenous  Once 01/08/21 1809 01/08/21 2006              Family Communication/Anticipated D/C date and plan/Code Status   DVT prophylaxis: SCDs Start: 01/08/21 2330     Code Status: Full Code  Family Communication: None Disposition Plan: Awaiting placement to SNF   Status is: Inpatient  Remains inpatient appropriate because: Awaiting placement to SNF           Subjective:   Interval events noted.  No new complaints.  No shortness of breath or chest pain  Objective:    Vitals:   02/03/21 0033 02/03/21 0415 02/03/21 0740 02/03/21 1150  BP: 123/73 136/77 (!) 138/97 118/80  Pulse: 75 79 69 75  Resp: 16  18 18   Temp: 97.8 F (36.6 C)  97.9 F (36.6 C) 98.3 F (36.8 C)  TempSrc:   Oral Oral  SpO2: 94% 95% 95% 95%  Weight:      Height:       No data found.   Intake/Output Summary (Last 24 hours) at 02/03/2021 1313 Last data filed at 02/03/2021 1152 Gross per 24 hour  Intake 1200 ml  Output 1875 ml  Net -675 ml   Filed Weights   01/08/21 1759 01/25/21 1348 01/30/21 0500  Weight: (!) 209.7 kg (!) 196 kg (!) 194.4 kg    Exam:  GEN: NAD SKIN: Warm and dry EYES: EOMI ENT: MMM CV: RRR PULM: CTA B ABD: soft, obese, NT, +BS CNS: AAO x 3, non focal EXT: No edema or tenderness     Data Reviewed:   I have personally reviewed following labs and imaging studies:  Labs: Labs show the following:    Basic Metabolic Panel: Recent Labs  Lab 01/28/21 0554 01/30/21 0645 02/01/21 0652 02/03/21 0214  NA 139 135 137 137  K 3.4* 3.6 3.3*  3.6  CL 97* 95* 98 97*  CO2 34* 33* 35* 33*  GLUCOSE 94 95 100* 95  BUN 16 15 13 15   CREATININE 1.04 0.73 0.80 0.86  CALCIUM 8.8* 8.5* 8.5* 8.7*   GFR Estimated Creatinine Clearance: 190.8 mL/min (by C-G formula based on SCr of 0.86 mg/dL). Liver Function Tests: No results for input(s): AST, ALT, ALKPHOS, BILITOT, PROT, ALBUMIN in the last 168 hours. No results for input(s): LIPASE, AMYLASE in the last 168 hours. No results for input(s): AMMONIA in the last 168 hours. Coagulation profile No results for input(s): INR, PROTIME in the last 168 hours.  CBC: No results for input(s): WBC, NEUTROABS, HGB, HCT, MCV, PLT in the last 168 hours. Cardiac Enzymes: No results for input(s): CKTOTAL, CKMB, CKMBINDEX, TROPONINI in the last 168 hours. BNP (last 3 results) No results for input(s): PROBNP in the last 8760 hours. CBG: No results for input(s): GLUCAP in the last 168 hours. D-Dimer: No results for input(s): DDIMER in the last 72 hours. Hgb A1c: No results for input(s): HGBA1C in the last 72 hours. Lipid Profile: No results for input(s): CHOL, HDL, LDLCALC, TRIG, CHOLHDL, LDLDIRECT in the last 72 hours. Thyroid function studies: No results for input(s): TSH, T4TOTAL, T3FREE, THYROIDAB in the last 72 hours.  Invalid input(s): FREET3 Anemia work up: No results for input(s): VITAMINB12, FOLATE, FERRITIN, TIBC, IRON, RETICCTPCT in the last 72 hours. Sepsis Labs: No results for input(s): PROCALCITON, WBC, LATICACIDVEN in the last 168 hours.  Microbiology No results found for this or any previous visit (from the past 240 hour(s)).  Procedures and diagnostic studies:  No results found.             LOS: 26 days   Shrihaan Porzio  [facility] on [email or web address]. If 7PM-7AM, please contact night-coverage at  [email or web address]     02/03/2021, 1:13 PM

## 2021-02-03 NOTE — Progress Notes (Signed)
Occupational Therapy Treatment Patient Details Name: Christian [name] MRN: [id number] DOB: 1975-06-22 Today's Date: 02/03/2021   History of present illness Pt is a 45 y/o M admitted on 01/08/21 with c/c of SOB. Pt noted to be positive for Influenza A. Pt noted to have cellulitic area to R buttock. Imaging reveals multifocal PNA in the R lung. PMH: morbid obesity, HTN   OT comments  Mr. Flakes seen for OT treatment on this date. Upon arrival to room pt awake/alert and seated in chair. Pt agreeable to tx. Pt SBA for showering, seated on BSC ~ 30 min, pt w/ good standing balance during shower when completing tasks requiring standing. Pt w/ good safety awareness during shower, notified OT of plans to stand. SBA + RW for functional mobility, ambulating ~ 10 ft. SBA for donning socks, seated EOC. Intermittent SUP for grooming, applying deodorant, lotion post shower. MOD I for bed mobility, completed as part of PASS assessment.    Pt participated in the Performance Assessment of Self-Care Skills (PASS) which is an observational tool which gives a snapshot of a client's ability to live independently and safely in the community by assessing various ADLs and IADLs. The client is rated on a four-point scale (0-3) for independence, adequacy, and safety. Pt performed a functional mobility task: Bed Mobility. He scored a  3 for independence, indicating that he does not need any verbal or tactile assistance to complete the task; a 2 for safety, indicating that minor risks were noted, but did not need assistance, and 2 for adequacy, indicating that that the quality and process of the task are acceptable. This indicates that the client is independent in this task, but has some deficits in safety and control of mobility.   Pt making good progress toward goals. Pt continues to benefit from skilled OT services to maximize return to PLOF and minimize risk of future falls, injury, caregiver burden, and readmission. Will  continue to follow POC. Discharge recommendation remains appropriate.     Recommendations for follow up therapy are one component of a multi-disciplinary discharge planning process, led by the attending physician.  Recommendations may be updated based on patient status, additional functional criteria and insurance authorization.    Follow Up Recommendations  Home health OT    Assistance Recommended at Discharge Intermittent Supervision/Assistance  Equipment Recommendations  BSC/3in1;Tub/shower seat    Recommendations for Other Services      Precautions / Restrictions Precautions Precautions: Fall Restrictions Weight Bearing Restrictions: No       Mobility Bed Mobility Overal bed mobility: Modified Independent             General bed mobility comments: Pt recieved/left in chair    Transfers Overall transfer level: Needs assistance Equipment used: Rolling walker (2 wheels) Transfers: Sit to/from Stand Sit to Stand: Supervision                 Balance Overall balance assessment: Needs assistance Sitting-balance support: Feet supported;No upper extremity supported Sitting balance-[name]: Normal     Standing balance support: No upper extremity supported Standing balance-[name]: Good                             ADL either performed or assessed with clinical judgement   ADL Overall ADL's : Needs assistance/impaired  General ADL Comments: Pt SBA- MIN A for showering, seated on BSC ~ 30 min, needed A for reaching back and LE. SBA + RW for functional mobility, ambulating ~ 10 ft. SBA for donning socks, seated EOC. Intermittent SUP for grooming, applying deodorant, lotion post shower. MOD I for bed mobility, completed as part of PASS assessment.    Extremity/Trunk Assessment               Cognition Arousal/Alertness: Awake/alert Behavior During Therapy: WFL for tasks  assessed/performed Overall Cognitive Status: Within Functional Limits for tasks assessed                                 General Comments: Pt with great sense of humor          Exercises Exercises: Other exercises Other Exercises Other Exercises: Pt educ re: OT role, falls prevention, safe showering, safe DME use Other Exercises: sit<>stand, shower, grooming, don socks, don gown, PASS assessment for bed mobility   Shoulder Instructions       General Comments RN changed wound dressing during tx session post shower    Pertinent Vitals/ Pain       Pain Assessment: No/denies pain  Home Living                                          Prior Functioning/Environment              Frequency  Min 2X/week        Progress Toward Goals  OT Goals(current goals can now be found in the care plan section)  Progress towards OT goals: Progressing toward goals  Acute Rehab OT Goals Patient Stated Goal: to get healthy OT Goal Formulation: With patient Time For Goal Achievement: 02/09/21 Potential to Achieve Goals: Good ADL Goals Pt Will Perform Grooming: with modified independence;standing Pt Will Perform Lower Body Dressing: with supervision;sitting/lateral leans;sit to/from stand Pt Will Perform Toileting - Clothing Manipulation and hygiene: with supervision;sit to/from stand;sitting/lateral leans Pt/caregiver will Perform Home Exercise Program: Increased ROM;Increased strength;With written HEP provided;With theraband;Independently  Plan Discharge plan remains appropriate;Frequency remains appropriate    Co-evaluation                 AM-PAC OT "6 Clicks" Daily Activity     Outcome Measure   Help from another person eating meals?: None Help from another person taking care of personal grooming?: A Little Help from another person toileting, which includes using toliet, bedpan, or urinal?: A Little Help from another person bathing  (including washing, rinsing, drying)?: A Little Help from another person to put on and taking off regular upper body clothing?: A Little Help from another person to put on and taking off regular lower body clothing?: A Little 6 Click Score: 19    End of Session Equipment Utilized During Treatment: Rolling walker (2 wheels)  OT Visit Diagnosis: Unsteadiness on feet (R26.81);Muscle weakness (generalized) (M62.81)   Activity Tolerance Patient tolerated treatment well   Patient Left with call bell/phone within reach;in chair   Nurse Communication Other (comment) (notified that wound dressing came off during tx, RN reapplied)        Time: 1430-1531 OT Time Calculation (min): 61 min  Charges: OT General Charges $OT Visit: 1 Visit OT Treatments $Self Care/Home Management : 53-67 mins  [facility],  OTS   [place] [facility] 02/03/2021, 4:00 PM

## 2021-02-03 NOTE — TOC Progression Note (Signed)
Transition of Care [facility]) - Progression Note    Patient Details  Name: Christian [name] MRN: [id number] Date of Birth: [birth year]  [facility] CM/SW Contact  [place] Cellar, RN Phone Number: 02/03/2021, 1:25 PM  Clinical Narrative:    LVMM for Jinny Sanders @ [phone] requesting callback to discuss possible placement assistance at boarding house with [facility].   Expected Discharge Plan: Homeless Shelter Barriers to Discharge: Continued Medical Work up  Expected Discharge Plan and Services Expected Discharge Plan: Homeless Shelter In-house Referral: Clinical Social Work   Post Acute Care Choice: NA Living arrangements for the past 2 months: Homeless Shelter Expected Discharge Date: 01/18/21                                     Social Determinants of Health (SDOH) Interventions    Readmission Risk Interventions Readmission Risk Prevention Plan 10/11/2020  Post Dischage Appt Complete  Medication Screening Complete  Transportation Screening Complete  Some recent data might be hidden

## 2021-02-04 NOTE — Progress Notes (Signed)
Progress Note    Christian [name]  [id number] DOB: 1975-07-08  DOA: 01/08/2021 PCP: Pcp, No      Brief Narrative:    Medical records reviewed and are as summarized below:  Christian [name] is a 45 y.o. male significant for morbid obesity and recent prolonged hospitalization complicated with perforated bowel status post operative repair, depression.  He presented with acute hypoxic respiratory failure and found to have influenza A pneumonia.     Long length of stay, admitted on 01/08/2021.   Difficult disposition due to homelessness, unable to return to shelter, disability application pending      Assessment/Plan:   Principal Problem:   Influenza A Active Problems:   Morbid obesity with BMI of 50.0-59.9, adult (HCC)   Pressure ulcer of buttock   Essential hypertension   Acute respiratory failure with hypoxemia (HCC)   Obesity hypoventilation syndrome (HCC)   Chronic diastolic CHF (congestive heart failure) (HCC)   Cellulitis and abscess of buttock   Prediabetes   Homelessness   Hypokalemia   Sepsis without acute organ dysfunction (HCC)   Nocturnal hypoxia    Body mass index is 57.92 kg/m.  (Morbid obesity)   Nocturnal hypoxemia/suspected obesity hypoventilation syndrome: Continue supplemental oxygen at night.  Outpatient follow-up for sleep study was strongly recommended.    Chronic diastolic CHF: Compensated.  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 Yes; Fluid consistency: Thin  Diet effective now                       Consultants: None  Procedures: None    Medications:    docusate sodium  100 mg Oral BID   enoxaparin (LOVENOX) injection  0.5 mg/kg Subcutaneous Q24H   fluticasone  2 spray Each Nare Daily   guaiFENesin  15 mL Oral QID   potassium chloride  40 mEq Oral TID   sertraline  50 mg Oral Daily   torsemide  20 mg Oral BID   Continuous Infusions:   Anti-infectives (From admission, onward)    Start     Dose/Rate Route Frequency Ordered Stop   01/11/21 2200  oseltamivir (TAMIFLU) capsule 75 mg        75 mg Oral 2 times daily 01/11/21 1317 01/13/21 1041   01/10/21 1030  amoxicillin-clavulanate (AUGMENTIN) 875-125 MG per tablet 1 tablet        1 tablet Oral Every 12 hours 01/10/21 0943 01/14/21 2054   01/09/21 1000  metroNIDAZOLE (FLAGYL) IVPB 500 mg  Status:  Discontinued        500 mg 100 mL/hr over 60 Minutes Intravenous Every 12 hours 01/08/21 2330 01/09/21 0813   01/09/21 1000  oseltamivir (TAMIFLU) capsule 75 mg  Status:  Discontinued        75 mg Oral 2 times daily 01/08/21 2330 01/11/21 1317   01/09/21 0815  ceFAZolin (ANCEF) IVPB 2g/100 mL premix  Status:  Discontinued        2 g 200 mL/hr over 30 Minutes Intravenous Every 8  hours 01/09/21 0809 01/10/21 0943   01/09/21 0800  ceFEPIme (MAXIPIME) 2 g in sodium chloride 0.9 % 100 mL IVPB  Status:  Discontinued        2 g 200 mL/hr over 30 Minutes Intravenous Every 8 hours 01/09/21 0655 01/09/21 0813   01/09/21 0700  vancomycin (VANCOREADY) IVPB 2000 mg/400 mL  Status:  Discontinued        2,000 mg 200 mL/hr over 120 Minutes Intravenous Every 12 hours 01/09/21 0045 01/09/21 0813   01/09/21 0030  vancomycin (VANCOREADY) IVPB 1500 mg/300 mL        1,500 mg 150 mL/hr over 120 Minutes Intravenous  Once 01/09/21 0029 01/09/21 0230   01/08/21 2345  ceFEPIme (MAXIPIME) 2 g in sodium chloride 0.9 % 100 mL IVPB  Status:  Discontinued        2 g 200 mL/hr over 30 Minutes Intravenous Every 24 hours 01/08/21 2330  01/09/21 0655   01/08/21 1915  oseltamivir (TAMIFLU) capsule 75 mg  Status:  Discontinued        75 mg Oral 2 times daily 01/08/21 1910 01/08/21 2330   01/08/21 1815  ceFEPIme (MAXIPIME) 2 g in sodium chloride 0.9 % 100 mL IVPB        2 g 200 mL/hr over 30 Minutes Intravenous  Once 01/08/21 1809 01/08/21 1902   01/08/21 1815  metroNIDAZOLE (FLAGYL) IVPB 500 mg        500 mg 100 mL/hr over 60 Minutes Intravenous  Once 01/08/21 1809 01/08/21 1930   01/08/21 1815  vancomycin (VANCOCIN) IVPB 1000 mg/200 mL premix        1,000 mg 200 mL/hr over 60 Minutes Intravenous  Once 01/08/21 1809 01/08/21 2006              Family Communication/Anticipated D/C date and plan/Code Status   DVT prophylaxis: SCDs Start: 01/08/21 2330     Code Status: Full Code  Family Communication: None Disposition Plan: Awaiting placement to SNF   Status is: Inpatient  Remains inpatient appropriate because: Awaiting placement to SNF           Subjective:   No chest pain or shortness of breath.  He feels weak and is only able to ambulate with assistance/walker  Objective:    Vitals:   02/03/21 1702 02/03/21 1958 02/04/21 0505 02/04/21 0753  BP: 139/85 121/72 125/80 (!) 132/92  Pulse: 82 84 71 70  Resp: 18 20 20 18   Temp: 97.8 F (36.6 C)  (!) 97.5 F (36.4 C) 97.9 F (36.6 C)  TempSrc: Oral  Oral Oral  SpO2: 92% 92% 94% 96%  Weight:   (!) 199.1 kg   Height:       No data found.   Intake/Output Summary (Last 24 hours) at 02/04/2021 1449 Last data filed at 02/04/2021 1254 Gross per 24 hour  Intake 990 ml  Output 2450 ml  Net -1460 ml   Filed Weights   01/25/21 1348 01/30/21 0500 02/04/21 0505  Weight: (!) 196 kg (!) 194.4 kg (!) 199.1 kg    Exam:  GEN: NAD SKIN: Warm and dry EYES: EOMI ENT: MMM CV: RRR PULM: CTA B ABD: soft, obese , NT, +BS CNS: AAO x 3, non focal EXT: No edema or tenderness         Data Reviewed:   I have personally reviewed following  labs and imaging studies:  Labs: Labs show the following:   Basic Metabolic Panel: Recent Labs  Lab 01/30/21 0645 02/01/21  6644 02/03/21 0214  NA 135 137 137  K 3.6 3.3* 3.6  CL 95* 98 97*  CO2 33* 35* 33*  GLUCOSE 95 100* 95  BUN 15 13 15   CREATININE 0.73 0.80 0.86  CALCIUM 8.5* 8.5* 8.7*   GFR Estimated Creatinine Clearance: 193.7 mL/min (by C-G formula based on SCr of 0.86 mg/dL). Liver Function Tests: No results for input(s): AST, ALT, ALKPHOS, BILITOT, PROT, ALBUMIN in the last 168 hours. No results for input(s): LIPASE, AMYLASE in the last 168 hours. No results for input(s): AMMONIA in the last 168 hours. Coagulation profile No results for input(s): INR, PROTIME in the last 168 hours.  CBC: No results for input(s): WBC, NEUTROABS, HGB, HCT, MCV, PLT in the last 168 hours. Cardiac Enzymes: No results for input(s): CKTOTAL, CKMB, CKMBINDEX, TROPONINI in the last 168 hours. BNP (last 3 results) No results for input(s): PROBNP in the last 8760 hours. CBG: No results for input(s): GLUCAP in the last 168 hours. D-Dimer: No results for input(s): DDIMER in the last 72 hours. Hgb A1c: No results for input(s): HGBA1C in the last 72 hours. Lipid Profile: No results for input(s): CHOL, HDL, LDLCALC, TRIG, CHOLHDL, LDLDIRECT in the last 72 hours. Thyroid function studies: No results for input(s): TSH, T4TOTAL, T3FREE, THYROIDAB in the last 72 hours.  Invalid input(s): FREET3 Anemia work up: No results for input(s): VITAMINB12, FOLATE, FERRITIN, TIBC, IRON, RETICCTPCT in the last 72 hours. Sepsis Labs: No results for input(s): PROCALCITON, WBC, LATICACIDVEN in the last 168 hours.  Microbiology No results found for this or any previous visit (from the past 240 hour(s)).  Procedures and diagnostic studies:  No results found.             LOS: 27 days   Christian [name]  [facility] on [email or web address]. If 7PM-7AM, please contact night-coverage  at [email or web address]     02/04/2021, 2:49 PM

## 2021-02-05 LAB — BASIC METABOLIC PANEL
Anion gap: 8 (ref 5–15)
BUN: 15 mg/dL (ref 6–20)
CO2: 32 mmol/L (ref 22–32)
Calcium: 8.6 mg/dL — ABNORMAL LOW (ref 8.9–10.3)
Chloride: 97 mmol/L — ABNORMAL LOW (ref 98–111)
Creatinine, Ser: 0.92 mg/dL (ref 0.61–1.24)
GFR, Estimated: >60 mL/min (ref >60)
Glucose, Bld: 107 mg/dL — ABNORMAL HIGH (ref 70–99)
Potassium: 3.8 mmol/L (ref 3.5–5.1)
Sodium: 137 mmol/L (ref 135–145)

## 2021-02-05 LAB — MAGNESIUM: Magnesium: 2.1 mg/dL (ref 1.7–2.4)

## 2021-02-05 NOTE — Progress Notes (Signed)
Progress Note    Christian [name]  [id number] DOB: 1976/02/12  DOA: 01/08/2021 PCP: Pcp, No      Brief Narrative:    Medical records reviewed and are as summarized below:  Christian [name] is a 45 y.o. male significant for morbid obesity and recent prolonged hospitalization complicated with perforated bowel status post operative repair, depression.  He presented with acute hypoxic respiratory failure and found to have influenza A pneumonia.     Long length of stay, admitted on 01/08/2021.   Difficult disposition due to homelessness, unable to return to shelter, disability application pending      Assessment/Plan:   Principal Problem:   Influenza A Active Problems:   Morbid obesity with BMI of 50.0-59.9, adult (HCC)   Pressure ulcer of buttock   Essential hypertension   Acute respiratory failure with hypoxemia (HCC)   Obesity hypoventilation syndrome (HCC)   Chronic diastolic CHF (congestive heart failure) (HCC)   Cellulitis and abscess of buttock   Prediabetes   Homelessness   Hypokalemia   Sepsis without acute organ dysfunction (HCC)   Nocturnal hypoxia    Body mass index is 57.92 kg/m.  (Morbid obesity)   Nocturnal hypoxemia/suspected obesity hypoventilation syndrome: Continue supplemental oxygen at night.  Outpatient follow-up for sleep study was strongly recommended.    Chronic diastolic CHF: Compensated.  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 Yes; Fluid consistency: Thin  Diet effective now                       Consultants: None  Procedures: None    Medications:    docusate sodium  100 mg Oral BID   enoxaparin (LOVENOX) injection  0.5 mg/kg Subcutaneous Q24H   fluticasone  2 spray Each Nare Daily   guaiFENesin  15 mL Oral QID   potassium chloride  40 mEq Oral TID   sertraline  50 mg Oral Daily   torsemide  20 mg Oral BID   Continuous Infusions:   Anti-infectives (From admission, onward)    Start     Dose/Rate Route Frequency Ordered Stop   01/11/21 2200  oseltamivir (TAMIFLU) capsule 75 mg        75 mg Oral 2 times daily 01/11/21 1317 01/13/21 1041   01/10/21 1030  amoxicillin-clavulanate (AUGMENTIN) 875-125 MG per tablet 1 tablet        1 tablet Oral Every 12 hours 01/10/21 0943 01/14/21 2054   01/09/21 1000  metroNIDAZOLE (FLAGYL) IVPB 500 mg  Status:  Discontinued        500 mg 100 mL/hr over 60 Minutes Intravenous Every 12 hours 01/08/21 2330 01/09/21 0813   01/09/21 1000  oseltamivir (TAMIFLU) capsule 75 mg  Status:  Discontinued        75 mg Oral 2 times daily 01/08/21 2330 01/11/21 1317   01/09/21 0815  ceFAZolin (ANCEF) IVPB 2g/100 mL premix  Status:  Discontinued        2 g 200 mL/hr over 30 Minutes Intravenous Every 8  hours 01/09/21 0809 01/10/21 0943   01/09/21 0800  ceFEPIme (MAXIPIME) 2 g in sodium chloride 0.9 % 100 mL IVPB  Status:  Discontinued        2 g 200 mL/hr over 30 Minutes Intravenous Every 8 hours 01/09/21 0655 01/09/21 0813   01/09/21 0700  vancomycin (VANCOREADY) IVPB 2000 mg/400 mL  Status:  Discontinued        2,000 mg 200 mL/hr over 120 Minutes Intravenous Every 12 hours 01/09/21 0045 01/09/21 0813   01/09/21 0030  vancomycin (VANCOREADY) IVPB 1500 mg/300 mL        1,500 mg 150 mL/hr over 120 Minutes Intravenous  Once 01/09/21 0029 01/09/21 0230   01/08/21 2345  ceFEPIme (MAXIPIME) 2 g in sodium chloride 0.9 % 100 mL IVPB  Status:  Discontinued        2 g 200 mL/hr over 30 Minutes Intravenous Every 24 hours 01/08/21 2330  01/09/21 0655   01/08/21 1915  oseltamivir (TAMIFLU) capsule 75 mg  Status:  Discontinued        75 mg Oral 2 times daily 01/08/21 1910 01/08/21 2330   01/08/21 1815  ceFEPIme (MAXIPIME) 2 g in sodium chloride 0.9 % 100 mL IVPB        2 g 200 mL/hr over 30 Minutes Intravenous  Once 01/08/21 1809 01/08/21 1902   01/08/21 1815  metroNIDAZOLE (FLAGYL) IVPB 500 mg        500 mg 100 mL/hr over 60 Minutes Intravenous  Once 01/08/21 1809 01/08/21 1930   01/08/21 1815  vancomycin (VANCOCIN) IVPB 1000 mg/200 mL premix        1,000 mg 200 mL/hr over 60 Minutes Intravenous  Once 01/08/21 1809 01/08/21 2006              Family Communication/Anticipated D/C date and plan/Code Status   DVT prophylaxis: SCDs Start: 01/08/21 2330     Code Status: Full Code  Family Communication: None Disposition Plan: Awaiting placement to SNF   Status is: Inpatient  Remains inpatient appropriate because: Awaiting placement to SNF           Subjective:   No new complaints.  No shortness of breath or chest pain.  Objective:    Vitals:   02/04/21 1930 02/05/21 0520 02/05/21 0809 02/05/21 1530  BP: 110/68 128/69 114/77 114/90  Pulse: 73 72 70 80  Resp: 18 20 18 18   Temp: 97.8 F (36.6 C) 97.9 F (36.6 C) 97.6 F (36.4 C) 97.8 F (36.6 C)  TempSrc: Oral Oral Oral Oral  SpO2: 93% 95% 96% 92%  Weight:      Height:       No data found.   Intake/Output Summary (Last 24 hours) at 02/05/2021 1717 Last data filed at 02/05/2021 1700 Gross per 24 hour  Intake 1680 ml  Output 2625 ml  Net -945 ml   Filed Weights   01/25/21 1348 01/30/21 0500 02/04/21 0505  Weight: (!) 196 kg (!) 194.4 kg (!) 199.1 kg    Exam:  GEN: NAD SKIN: Warm and dry.  Stage III decubitus ulcer on right upper posterior thigh EYES: EOMI ENT: MMM CV: RRR PULM: CTA B ABD: soft, obese, NT, +BS CNS: AAO x 3, non focal EXT: No edema or tenderness           Data Reviewed:   I have personally  reviewed following labs and imaging studies:  Labs: Labs show the following:   Basic Metabolic Panel: Recent Labs  Lab 01/30/21  7322 02/01/21 0652 02/03/21 0214 02/05/21 0148  NA 135 137 137 137  K 3.6 3.3* 3.6 3.8  CL 95* 98 97* 97*  CO2 33* 35* 33* 32  GLUCOSE 95 100* 95 107*  BUN 15 13 15 15   CREATININE 0.73 0.80 0.86 0.92  CALCIUM 8.5* 8.5* 8.7* 8.6*  MG  --   --   --  2.1   GFR Estimated Creatinine Clearance: 181.1 mL/min (by C-G formula based on SCr of 0.92 mg/dL). Liver Function Tests: No results for input(s): AST, ALT, ALKPHOS, BILITOT, PROT, ALBUMIN in the last 168 hours. No results for input(s): LIPASE, AMYLASE in the last 168 hours. No results for input(s): AMMONIA in the last 168 hours. Coagulation profile No results for input(s): INR, PROTIME in the last 168 hours.  CBC: No results for input(s): WBC, NEUTROABS, HGB, HCT, MCV, PLT in the last 168 hours. Cardiac Enzymes: No results for input(s): CKTOTAL, CKMB, CKMBINDEX, TROPONINI in the last 168 hours. BNP (last 3 results) No results for input(s): PROBNP in the last 8760 hours. CBG: No results for input(s): GLUCAP in the last 168 hours. D-Dimer: No results for input(s): DDIMER in the last 72 hours. Hgb A1c: No results for input(s): HGBA1C in the last 72 hours. Lipid Profile: No results for input(s): CHOL, HDL, LDLCALC, TRIG, CHOLHDL, LDLDIRECT in the last 72 hours. Thyroid function studies: No results for input(s): TSH, T4TOTAL, T3FREE, THYROIDAB in the last 72 hours.  Invalid input(s): FREET3 Anemia work up: No results for input(s): VITAMINB12, FOLATE, FERRITIN, TIBC, IRON, RETICCTPCT in the last 72 hours. Sepsis Labs: No results for input(s): PROCALCITON, WBC, LATICACIDVEN in the last 168 hours.  Microbiology No results found for this or any previous visit (from the past 240 hour(s)).  Procedures and diagnostic studies:  No results found.             LOS: 28 days   Tarini Carrier  [employer] on [email or web address]. If 7PM-7AM, please contact night-coverage at [email or web address]     02/05/2021, 5:17 PM

## 2021-02-05 NOTE — Progress Notes (Signed)
Mobility Specialist - Progress Note   02/05/21 1600  Mobility  Activity Ambulated in hall  Level of Assistance Modified independent, requires aide device or extra time  Assistive Device Front wheel walker  Distance Ambulated (ft) 160 ft  Mobility Ambulated with assistance in hallway  Mobility Response Tolerated well  Mobility performed by Mobility specialist  $Mobility charge 1 Mobility    During mobility: 111 HR, 89-90% SpO2   Pt received on bathroom toilet for BM, assistance for peri-care. Ambulated in hallway on RA, denied SOB. Mild windedness but O2 maintained > 88% during activity. Returned to [employer].    Filiberto Pinks Mobility Specialist 02/05/21, 4:20 PM

## 2021-02-05 NOTE — Progress Notes (Signed)
   02/05/21 1115  Clinical Encounter Type  Visited With Patient  Visit Type Initial;Follow-up;Psychological support;Spiritual support  Referral From Chaplain  Consult/Referral To [facility] visited PT as requested by PT. Chaplain met PT previously me PT when he was in the hospital. Chaplain explored trauma and coping mechanism with PT. Chaplain ministered with calm presence, reflective listening,and encouragement.  Andee Poles, MDiv

## 2021-02-05 NOTE — Progress Notes (Signed)
Physical Therapy Treatment Patient Details Name: Christian [name] MRN: [id number] DOB: [birth year] Today's Date: 02/05/2021   History of Present Illness Pt is a 45 y/o M admitted on 01/08/21 with c/c of SOB. Pt noted to be positive for Influenza A. Pt noted to have cellulitic area to R buttock. Imaging reveals multifocal PNA in the R lung. PMH: morbid obesity, HTN    PT Comments    Pt was asleep in supine upon arriving. He agrees to PT session and is cooperative and motivated throughout. He was able to exit bed with increased time and use of bed rail. Stood with supervision prior to taking several steps to recliner. Performed 5 x STS without UE assistance prior to ambulating to stairwell and performing ascending/descending 12 stairs with prolonged standing rest 1/2 way up. He overall continues to demonstrate improving activity tolerance and strength however remains deconditioned overall. He will benefit from OP PT and cardio-pulmonary rehab at DC. Per discussion with cardiopulmonary rehab, will need a order from PCP/ outpatient cardiologist to qualify. Acute PT will continue to follow and progress pt per current POC.   Recommendations for follow up therapy are one component of a multi-disciplinary discharge planning process, led by the attending physician.  Recommendations may be updated based on patient status, additional functional criteria and insurance authorization.  Follow Up Recommendations  Outpatient PT (Will also benefit from cardio-pulmonary rehab if able)     Assistance Recommended at Discharge Set up Supervision/Assistance  Equipment Recommendations  None recommended by PT       Precautions / Restrictions Precautions Precautions: Fall Restrictions Weight Bearing Restrictions: No     Mobility  Bed Mobility Overal bed mobility: Modified Independent   Transfers Overall transfer level: Needs assistance Equipment used: Rolling walker (2 wheels) (bariatric) Transfers: Sit  to/from Stand Sit to Stand: Supervision    General transfer comment: Pt performed 5 x STS in addition to STS 2 more times throughout session. not formally timed this session.    Ambulation/Gait Ambulation/Gait assistance: Supervision Gait Distance (Feet): 250 Feet Assistive device: Rolling walker (2 wheels) Gait Pattern/deviations: Step-through pattern Gait velocity: decreased     General Gait Details: Pt ambulated to stairwell and back without seated rest. does however require several standing rest due to fatigue. was able to advance to ascending full FOS with BUE on +2 rails   Stairs Stairs: Yes Stairs assistance: Supervision Stair Management: Two rails;Step to pattern;Forwards Number of Stairs: 12 General stair comments: pt was able to ascend/descend 12 stair without physical assistance however does require extensive rest ~ 1/2 way up stairs.     Balance Overall balance assessment: Needs assistance Sitting-balance support: Feet supported;No upper extremity supported Sitting balance-[name]: Normal     Standing balance support: No upper extremity supported Standing balance-[name]: Good     Cognition Arousal/Alertness: Awake/alert Behavior During Therapy: WFL for tasks assessed/performed Overall Cognitive Status: Within Functional Limits for tasks assessed      General Comments: Pt is alert and oriented but does require some increased time to process and respond               Pertinent Vitals/Pain Pain Assessment: No/denies pain     PT Goals (current goals can now be found in the care plan section) Acute Rehab PT Goals Patient Stated Goal: get stronger before I leave Progress towards PT goals: Progressing toward goals    Frequency    Min 2X/week      PT Plan Current plan remains appropriate  AM-PAC PT "6 Clicks" Mobility   Outcome Measure  Help needed turning from your back to your side while in a flat bed without using bedrails?:  A Little Help needed moving from lying on your back to sitting on the side of a flat bed without using bedrails?: A Little Help needed moving to and from a bed to a chair (including a wheelchair)?: A Little Help needed standing up from a chair using your arms (e.g., wheelchair or bedside chair)?: A Little Help needed to walk in hospital room?: A Little Help needed climbing 3-5 steps with a railing? : A Little 6 Click Score: 18    End of Session         PT Visit Diagnosis: Muscle weakness (generalized) (M62.81)     Time: 6812-7517 PT Time Calculation (min) (ACUTE ONLY): 25 min  Charges:  $Gait Training: 23-37 mins                    Jetta Lout PTA 02/05/21, 12:18 PM

## 2021-02-06 MED ORDER — ENOXAPARIN SODIUM 100 MG/ML IJ SOSY
0.5000 mg/kg | PREFILLED_SYRINGE | INTRAMUSCULAR | Status: DC
  Administered 2021-02-07 – 2021-02-12 (×6): 100 mg via SUBCUTANEOUS
  Filled 2021-02-06 (×6): qty 1

## 2021-02-06 NOTE — Progress Notes (Signed)
Progress Note    Christian [name]  YHC:[id number] DOB: Mar 28, 1975  DOA: 01/08/2021 PCP: Pcp, No      Brief Narrative:    Medical records reviewed and are as summarized below:  Christian [name] is a 45 y.o. male significant for morbid obesity and recent prolonged hospitalization complicated with perforated bowel status post operative repair, depression.  He presented with acute hypoxic respiratory failure and found to have influenza A pneumonia.     Long length of stay, admitted on 01/08/2021.   Difficult disposition due to homelessness, unable to return to shelter, disability application pending      Assessment/Plan:   Principal Problem:   Influenza A Active Problems:   Morbid obesity with BMI of 50.0-59.9, adult (HCC)   Pressure ulcer of buttock   Essential hypertension   Acute respiratory failure with hypoxemia (HCC)   Obesity hypoventilation syndrome (HCC)   Chronic diastolic CHF (congestive heart failure) (HCC)   Cellulitis and abscess of buttock   Prediabetes   Homelessness   Hypokalemia   Sepsis without acute organ dysfunction (HCC)   Nocturnal hypoxia    Body mass index is 57.92 kg/m.  (Morbid obesity)   Nocturnal hypoxemia/suspected obesity hypoventilation syndrome: Continue supplemental oxygen at night.  Outpatient follow-up for sleep study was strongly recommended.    Chronic diastolic CHF: No acute issues.  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 Yes; Fluid consistency: Thin  Diet effective now                       Consultants: None  Procedures: None    Medications:    docusate sodium  100 mg Oral BID   [START ON 02/07/2021] enoxaparin (LOVENOX) injection  0.5 mg/kg Subcutaneous Q24H   fluticasone  2 spray Each Nare Daily   guaiFENesin  15 mL Oral QID   potassium chloride  40 mEq Oral TID   sertraline  50 mg Oral Daily   torsemide  20 mg Oral BID   Continuous Infusions:   Anti-infectives (From admission, onward)    Start     Dose/Rate Route Frequency Ordered Stop   01/11/21 2200  oseltamivir (TAMIFLU) capsule 75 mg        75 mg Oral 2 times daily 01/11/21 1317 01/13/21 1041   01/10/21 1030  amoxicillin-clavulanate (AUGMENTIN) 875-125 MG per tablet 1 tablet        1 tablet Oral Every 12 hours 01/10/21 0943 01/14/21 2054   01/09/21 1000  metroNIDAZOLE (FLAGYL) IVPB 500 mg  Status:  Discontinued        500 mg 100 mL/hr over 60 Minutes Intravenous Every 12 hours 01/08/21 2330 01/09/21 0813   01/09/21 1000  oseltamivir (TAMIFLU) capsule 75 mg  Status:  Discontinued        75 mg Oral 2 times daily 01/08/21 2330 01/11/21 1317   01/09/21 0815  ceFAZolin (ANCEF) IVPB 2g/100 mL premix  Status:  Discontinued        2 g 200 mL/hr over  30 Minutes Intravenous Every 8 hours 01/09/21 0809 01/10/21 0943   01/09/21 0800  ceFEPIme (MAXIPIME) 2 g in sodium chloride 0.9 % 100 mL IVPB  Status:  Discontinued        2 g 200 mL/hr over 30 Minutes Intravenous Every 8 hours 01/09/21 0655 01/09/21 0813   01/09/21 0700  vancomycin (VANCOREADY) IVPB 2000 mg/400 mL  Status:  Discontinued        2,000 mg 200 mL/hr over 120 Minutes Intravenous Every 12 hours 01/09/21 0045 01/09/21 0813   01/09/21 0030  vancomycin (VANCOREADY) IVPB 1500 mg/300 mL        1,500 mg 150 mL/hr over 120 Minutes Intravenous  Once 01/09/21 0029 01/09/21 0230   01/08/21 2345  ceFEPIme (MAXIPIME) 2 g in sodium chloride 0.9 % 100 mL IVPB  Status:  Discontinued        2 g 200 mL/hr over 30 Minutes Intravenous Every  24 hours 01/08/21 2330 01/09/21 0655   01/08/21 1915  oseltamivir (TAMIFLU) capsule 75 mg  Status:  Discontinued        75 mg Oral 2 times daily 01/08/21 1910 01/08/21 2330   01/08/21 1815  ceFEPIme (MAXIPIME) 2 g in sodium chloride 0.9 % 100 mL IVPB        2 g 200 mL/hr over 30 Minutes Intravenous  Once 01/08/21 1809 01/08/21 1902   01/08/21 1815  metroNIDAZOLE (FLAGYL) IVPB 500 mg        500 mg 100 mL/hr over 60 Minutes Intravenous  Once 01/08/21 1809 01/08/21 1930   01/08/21 1815  vancomycin (VANCOCIN) IVPB 1000 mg/200 mL premix        1,000 mg 200 mL/hr over 60 Minutes Intravenous  Once 01/08/21 1809 01/08/21 2006              Family Communication/Anticipated D/C date and plan/Code Status   DVT prophylaxis: SCDs Start: 01/08/21 2330     Code Status: Full Code  Family Communication: None Disposition Plan: Awaiting placement to SNF   Status is: Inpatient  Remains inpatient appropriate because: Awaiting placement to SNF           Subjective:   Interval events noted. No shortness of breath or chest pain  Objective:    Vitals:   02/05/21 1530 02/05/21 1936 02/06/21 0424 02/06/21 0719  BP: 114/90 121/83 137/79 130/81  Pulse: 80 82 67 70  Resp: 18 20 20 14   Temp: 97.8 F (36.6 C) 97.6 F (36.4 C) 97.7 F (36.5 C) 97.8 F (36.6 C)  TempSrc: Oral  Oral Oral  SpO2: 92% 92% 97% 97%  Weight:      Height:       No data found.   Intake/Output Summary (Last 24 hours) at 02/06/2021 1310 Last data filed at 02/06/2021 1007 Gross per 24 hour  Intake 1440 ml  Output 2200 ml  Net -760 ml   Filed Weights   01/25/21 1348 01/30/21 0500 02/04/21 0505  Weight: (!) 196 kg (!) 194.4 kg (!) 199.1 kg    Exam:  GEN: NAD SKIN: Warm and dry EYES: EOMI ENT: MMM CV: RRR PULM: CTA B ABD: soft, obese, NT, +BS CNS: AAO x 3, non focal EXT: No edema or tenderness           Data Reviewed:   I have personally reviewed following labs and imaging  studies:  Labs: Labs show the following:   Basic Metabolic Panel: Recent Labs  Lab 02/01/21 0652 02/03/21 0214 02/05/21 0148  NA 137 137 137  K 3.3* 3.6 3.8  CL 98 97* 97*  CO2 35* 33* 32  GLUCOSE 100* 95 107*  BUN 13 15 15   CREATININE 0.80 0.86 0.92  CALCIUM 8.5* 8.7* 8.6*  MG  --   --  2.1   GFR Estimated Creatinine Clearance: 181.1 mL/min (by C-G formula based on SCr of 0.92 mg/dL). Liver Function Tests: No results for input(s): AST, ALT, ALKPHOS, BILITOT, PROT, ALBUMIN in the last 168 hours. No results for input(s): LIPASE, AMYLASE in the last 168 hours. No results for input(s): AMMONIA in the last 168 hours. Coagulation profile No results for input(s): INR, PROTIME in the last 168 hours.  CBC: No results for input(s): WBC, NEUTROABS, HGB, HCT, MCV, PLT in the last 168 hours. Cardiac Enzymes: No results for input(s): CKTOTAL, CKMB, CKMBINDEX, TROPONINI in the last 168 hours. BNP (last 3 results) No results for input(s): PROBNP in the last 8760 hours. CBG: No results for input(s): GLUCAP in the last 168 hours. D-Dimer: No results for input(s): DDIMER in the last 72 hours. Hgb A1c: No results for input(s): HGBA1C in the last 72 hours. Lipid Profile: No results for input(s): CHOL, HDL, LDLCALC, TRIG, CHOLHDL, LDLDIRECT in the last 72 hours. Thyroid function studies: No results for input(s): TSH, T4TOTAL, T3FREE, THYROIDAB in the last 72 hours.  Invalid input(s): FREET3 Anemia work up: No results for input(s): VITAMINB12, FOLATE, FERRITIN, TIBC, IRON, RETICCTPCT in the last 72 hours. Sepsis Labs: No results for input(s): PROCALCITON, WBC, LATICACIDVEN in the last 168 hours.  Microbiology No results found for this or any previous visit (from the past 240 hour(s)).  Procedures and diagnostic studies:  No results found.             LOS: 29 days   [employer], advertising on [email or web address]. If 7PM-7AM, please contact  night-coverage at [email or web address]     02/06/2021, 1:10 PM

## 2021-02-07 NOTE — Progress Notes (Signed)
Progress Note    Christian [name]  [id number] DOB: 20-May-1975  DOA: 01/08/2021 PCP: Pcp, No      Brief Narrative:    Medical records reviewed and are as summarized below:  Christian [name] is a 45 y.o. male significant for morbid obesity and recent prolonged hospitalization complicated with perforated bowel status post operative repair, depression.  He presented with acute hypoxic respiratory failure and found to have influenza A pneumonia.     Long length of stay, admitted on 01/08/2021.   Difficult disposition due to homelessness, unable to return to shelter, disability application pending      Assessment/Plan:   Principal Problem:   Influenza A Active Problems:   Morbid obesity with BMI of 50.0-59.9, adult (HCC)   Pressure ulcer of buttock   Essential hypertension   Acute respiratory failure with hypoxemia (HCC)   Obesity hypoventilation syndrome (HCC)   Chronic diastolic CHF (congestive heart failure) (HCC)   Cellulitis and abscess of buttock   Prediabetes   Homelessness   Hypokalemia   Sepsis without acute organ dysfunction (HCC)   Nocturnal hypoxia    Body mass index is 57.92 kg/m.  (Morbid obesity)   Nocturnal hypoxemia/suspected obesity hypoventilation syndrome: Continue supplemental oxygen at night.  Outpatient follow-up for sleep study was strongly recommended.    Chronic diastolic CHF: Compensated.  Continue torsemide.  Hypokalemia: Continue potassium supplement.  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 Yes; Fluid  consistency: Thin  Diet effective now                      Consultants: None  Procedures: None    Medications:    docusate sodium  100 mg Oral BID   enoxaparin (LOVENOX) injection  0.5 mg/kg Subcutaneous Q24H   fluticasone  2 spray Each Nare Daily   guaiFENesin  15 mL Oral QID   potassium chloride  40 mEq Oral TID   sertraline  50 mg Oral Daily   torsemide  20 mg Oral BID   Continuous Infusions:   Anti-infectives (From admission, onward)    Start     Dose/Rate Route Frequency Ordered Stop   01/11/21 2200  oseltamivir (TAMIFLU) capsule 75 mg        75 mg Oral 2 times daily 01/11/21 1317 01/13/21 1041   01/10/21 1030  amoxicillin-clavulanate (AUGMENTIN) 875-125 MG per tablet 1 tablet        1 tablet Oral Every 12 hours 01/10/21 0943 01/14/21 2054   01/09/21 1000  metroNIDAZOLE (FLAGYL) IVPB 500 mg  Status:  Discontinued        500 mg 100 mL/hr over 60 Minutes Intravenous Every 12 hours 01/08/21 2330 01/09/21 0813   01/09/21 1000  oseltamivir (TAMIFLU) capsule 75 mg  Status:  Discontinued        75 mg Oral 2 times daily 01/08/21 2330 01/11/21 1317   01/09/21 0815  ceFAZolin (ANCEF) IVPB 2g/100 mL premix  Status:  Discontinued        2 g 200  mL/hr over 30 Minutes Intravenous Every 8 hours 01/09/21 0809 01/10/21 0943   01/09/21 0800  ceFEPIme (MAXIPIME) 2 g in sodium chloride 0.9 % 100 mL IVPB  Status:  Discontinued        2 g 200 mL/hr over 30 Minutes Intravenous Every 8 hours 01/09/21 0655 01/09/21 0813   01/09/21 0700  vancomycin (VANCOREADY) IVPB 2000 mg/400 mL  Status:  Discontinued        2,000 mg 200 mL/hr over 120 Minutes Intravenous Every 12 hours 01/09/21 0045 01/09/21 0813   01/09/21 0030  vancomycin (VANCOREADY) IVPB 1500 mg/300 mL        1,500 mg 150 mL/hr over 120 Minutes Intravenous  Once 01/09/21 0029 01/09/21 0230   01/08/21 2345  ceFEPIme (MAXIPIME) 2 g in sodium chloride 0.9 % 100 mL IVPB  Status:  Discontinued        2 g 200 mL/hr over 30  Minutes Intravenous Every 24 hours 01/08/21 2330 01/09/21 0655   01/08/21 1915  oseltamivir (TAMIFLU) capsule 75 mg  Status:  Discontinued        75 mg Oral 2 times daily 01/08/21 1910 01/08/21 2330   01/08/21 1815  ceFEPIme (MAXIPIME) 2 g in sodium chloride 0.9 % 100 mL IVPB        2 g 200 mL/hr over 30 Minutes Intravenous  Once 01/08/21 1809 01/08/21 1902   01/08/21 1815  metroNIDAZOLE (FLAGYL) IVPB 500 mg        500 mg 100 mL/hr over 60 Minutes Intravenous  Once 01/08/21 1809 01/08/21 1930   01/08/21 1815  vancomycin (VANCOCIN) IVPB 1000 mg/200 mL premix        1,000 mg 200 mL/hr over 60 Minutes Intravenous  Once 01/08/21 1809 01/08/21 2006              Family Communication/Anticipated D/C date and plan/Code Status   DVT prophylaxis: SCDs Start: 01/08/21 2330     Code Status: Full Code  Family Communication: None Disposition Plan: Awaiting placement to SNF   Status is: Inpatient  Remains inpatient appropriate because: Awaiting placement to SNF           Subjective:   No chest pain or shortness of breath.  He has a mild cough  Objective:    Vitals:   02/06/21 1638 02/06/21 1933 02/07/21 0511 02/07/21 0724  BP: 129/73 139/70 119/75 125/70  Pulse: 76 79 71 68  Resp: 14 16 18 18   Temp: 98.7 F (37.1 C) 98.2 F (36.8 C) 97.8 F (36.6 C) 98.2 F (36.8 C)  TempSrc: Oral Oral Oral Oral  SpO2: 93% 96% 96% 96%  Weight:      Height:       No data found.   Intake/Output Summary (Last 24 hours) at 02/07/2021 1120 Last data filed at 02/07/2021 1049 Gross per 24 hour  Intake 1080 ml  Output 900 ml  Net 180 ml   Filed Weights   01/25/21 1348 01/30/21 0500 02/04/21 0505  Weight: (!) 196 kg (!) 194.4 kg (!) 199.1 kg    Exam:  GEN: NAD SKIN: Stage III decubitus ulcer on the right upper posterior thigh EYES: No pallor or icterus ENT: MMM CV: RRR PULM: CTA B ABD: soft, ND, NT, +BS CNS: AAO x 3, non focal EXT: No edema or  tenderness           Data Reviewed:   I have personally reviewed following labs and imaging studies:  Labs: Labs show the following:  Basic Metabolic Panel: Recent Labs  Lab 02/01/21 0652 02/03/21 0214 02/05/21 0148  NA 137 137 137  K 3.3* 3.6 3.8  CL 98 97* 97*  CO2 35* 33* 32  GLUCOSE 100* 95 107*  BUN 13 15 15   CREATININE 0.80 0.86 0.92  CALCIUM 8.5* 8.7* 8.6*  MG  --   --  2.1   GFR Estimated Creatinine Clearance: 181.1 mL/min (by C-G formula based on SCr of 0.92 mg/dL). Liver Function Tests: No results for input(s): AST, ALT, ALKPHOS, BILITOT, PROT, ALBUMIN in the last 168 hours. No results for input(s): LIPASE, AMYLASE in the last 168 hours. No results for input(s): AMMONIA in the last 168 hours. Coagulation profile No results for input(s): INR, PROTIME in the last 168 hours.  CBC: No results for input(s): WBC, NEUTROABS, HGB, HCT, MCV, PLT in the last 168 hours. Cardiac Enzymes: No results for input(s): CKTOTAL, CKMB, CKMBINDEX, TROPONINI in the last 168 hours. BNP (last 3 results) No results for input(s): PROBNP in the last 8760 hours. CBG: No results for input(s): GLUCAP in the last 168 hours. D-Dimer: No results for input(s): DDIMER in the last 72 hours. Hgb A1c: No results for input(s): HGBA1C in the last 72 hours. Lipid Profile: No results for input(s): CHOL, HDL, LDLCALC, TRIG, CHOLHDL, LDLDIRECT in the last 72 hours. Thyroid function studies: No results for input(s): TSH, T4TOTAL, T3FREE, THYROIDAB in the last 72 hours.  Invalid input(s): FREET3 Anemia work up: No results for input(s): VITAMINB12, FOLATE, FERRITIN, TIBC, IRON, RETICCTPCT in the last 72 hours. Sepsis Labs: No results for input(s): PROCALCITON, WBC, LATICACIDVEN in the last 168 hours.  Microbiology No results found for this or any previous visit (from the past 240 hour(s)).  Procedures and diagnostic studies:  No results found.             LOS: 30 days    [employer], advertising on [email or web address]. If 7PM-7AM, please contact night-coverage at [email or web address]     02/07/2021, 11:20 AM

## 2021-02-08 LAB — POTASSIUM: Potassium: 3.8 mmol/L (ref 3.5–5.1)

## 2021-02-08 LAB — MAGNESIUM: Magnesium: 2.1 mg/dL (ref 1.7–2.4)

## 2021-02-08 NOTE — TOC Progression Note (Signed)
Transition of Care [facility]) - Progression Note    Patient Details  Name: Christian [name] MRN: [id number] Date of Birth: Jun 15, 1975  [facility]) CM/SW Contact  Margarito Liner, LCSW Phone Number: 02/08/2021, 3:45 PM  Clinical Narrative:  Sent secure chat to MD and RN requesting oxygen sats test and DME orders. Updated patient and told him he would need a card on file for DME and it will not be charged. Patient said he does get food stamps. TOC supervisor will check with boarding house regarding how patient would be able to get groceries.   Expected Discharge Plan: Homeless Shelter Barriers to Discharge: Continued Medical Work up  Expected Discharge Plan and Services Expected Discharge Plan: Homeless Shelter In-house Referral: Clinical Social Work   Post Acute Care Choice: NA Living arrangements for the past 2 months: Homeless Shelter Expected Discharge Date: 01/18/21                                     Social Determinants of Health (SDOH) Interventions    Readmission Risk Interventions Readmission Risk Prevention Plan 10/11/2020  Post Dischage Appt Complete  Medication Screening Complete  Transportation Screening Complete  Some recent data might be hidden

## 2021-02-08 NOTE — TOC Progression Note (Signed)
Transition of Care [facility]) - Progression Note    Patient Details  Name: Christian [name] MRN: [id number] Date of Birth: [birth year]  Transition of Care [facility]) CM/SW Contact  Iola Cellar, RN Phone Number: 02/08/2021, 8:43 AM  Clinical Narrative:    Outreach to Jinny Sanders @ [phone] requesting callback to discuss possible placement assistance at boarding house with LOG   Expected Discharge Plan: Homeless Shelter Barriers to Discharge: Continued Medical Work up  Expected Discharge Plan and Services Expected Discharge Plan: Homeless Shelter In-house Referral: Clinical Social Work   Post Acute Care Choice: NA Living arrangements for the past 2 months: Homeless Shelter Expected Discharge Date: 01/18/21                                     Social Determinants of Health (SDOH) Interventions    Readmission Risk Interventions Readmission Risk Prevention Plan 10/11/2020  Post Dischage Appt Complete  Medication Screening Complete  Transportation Screening Complete  Some recent data might be hidden

## 2021-02-08 NOTE — Progress Notes (Signed)
Occupational Therapy Re Evaluation Patient Details Name: Christian [name] MRN: [id number] DOB: 10/06/1975 Today's Date: 02/08/2021   History of present illness Pt is a 45 y/o M admitted on 01/08/21 with c/c of SOB. Pt noted to be positive for Influenza A. Pt noted to have cellulitic area to R buttock. Imaging reveals multifocal PNA in the R lung. PMH: morbid obesity, HTN   OT comments  [name] seen for OT Re-evaluation on this date. Upon arrival to room pt awake/alert. Pt seated in chair. Pt agreeable to tx. Pt instructed in HEP (shoulder pulls, shoulder flexion, biceps and triceps -3 sets each x 10) and pt return demonstrated of instruction provided. Pt SUP + RW for functional mobility, STS for wound dressing change. Pt met previous goals and new goals set to MOD I. Pt continues to benefit from skilled OT services to maximize return to PLOF and minimize risk of future falls, injury, caregiver burden, and readmission. Will continue to follow POC. Discharge recommendation remains appropriate.     Recommendations for follow up therapy are one component of a multi-disciplinary discharge planning process, led by the attending physician.  Recommendations may be updated based on patient status, additional functional criteria and insurance authorization.    Follow Up Recommendations  Home health OT    Assistance Recommended at Discharge Intermittent Supervision/Assistance  Equipment Recommendations  BSC/3in1;Tub/shower seat    Recommendations for Other Services      Precautions / Restrictions Precautions Precautions: Fall Restrictions Weight Bearing Restrictions: No       Mobility Bed Mobility               General bed mobility comments: Pt recieved/left in recliner    Transfers Overall transfer level: Modified independent Equipment used: Rolling walker (2 wheels) Transfers: Sit to/from Stand Sit to Stand: Modified independent (Device/Increase time)                  Balance Overall balance assessment: Needs assistance Sitting-balance support: Feet supported;No upper extremity supported Sitting balance-[name]: Normal     Standing balance support: No upper extremity supported Standing balance-[name]: Good                             ADL either performed or assessed with clinical judgement   ADL Overall ADL's : Needs assistance/impaired                                       General ADL Comments: Pt SUP + RW for functional mobility, STS for wound dressing change.    Extremity/Trunk Assessment              Vision       Perception     Praxis      Cognition Arousal/Alertness: Awake/alert Behavior During Therapy: WFL for tasks assessed/performed Overall Cognitive Status: Within Functional Limits for tasks assessed                                            Exercises Exercises: Other exercises Other Exercises Other Exercises: Pt educ re: OT role, HEP program, goals for re-eval Other Exercises: HEP- chest pulls, shoulder flexion, biceps, triceps x10   Shoulder Instructions       General Comments  Pertinent Vitals/ Pain       Pain Assessment: No/denies pain  Home Living                                          Prior Functioning/Environment              Frequency  Min 2X/week        Progress Toward Goals  OT Goals(current goals can now be found in the care plan section)  Progress towards OT goals: Progressing toward goals  Acute Rehab OT Goals Patient Stated Goal: to shower standing up OT Goal Formulation: With patient Time For Goal Achievement: 02/22/21 Potential to Achieve Goals: Good ADL Goals Pt Will Perform Lower Body Dressing: with modified independence;sit to/from stand Pt Will Perform Toileting - Clothing Manipulation and hygiene: with modified independence;sit to/from stand Additional ADL Goal #1: Pt will verbalize 3/3  strategies for for falls prevention demonstrating good safety awareness. Additional ADL Goal #2: Pt will practice dynamic standing balance during shower needed fewer than 2 verbal cues for safety.  Plan Discharge plan remains appropriate;Frequency remains appropriate    Co-evaluation                 AM-PAC OT "6 Clicks" Daily Activity     Outcome Measure   Help from another person eating meals?: None Help from another person taking care of personal grooming?: A Little Help from another person toileting, which includes using toliet, bedpan, or urinal?: A Little Help from another person bathing (including washing, rinsing, drying)?: A Little Help from another person to put on and taking off regular upper body clothing?: A Little Help from another person to put on and taking off regular lower body clothing?: A Little 6 Click Score: 19    End of Session Equipment Utilized During Treatment: Rolling walker (2 wheels);Other (comment) (Level 2 Therapy Band)  OT Visit Diagnosis: Unsteadiness on feet (R26.81);Muscle weakness (generalized) (M62.81)   Activity Tolerance Patient tolerated treatment well   Patient Left in chair;with call bell/phone within reach   Nurse Communication          Time: 1660-6301 OT Time Calculation (min): 26 min  Charges: OT General Charges $OT Visit: 1 Visit OT Evaluation $OT Re-eval: 1 Re-eval OT Treatments $Self Care/Home Management : 8-22 mins $Therapeutic Exercise: 8-22 mins  Nino Glow, OTS   Nino Glow 02/08/2021, 3:15 PM

## 2021-02-08 NOTE — TOC Progression Note (Signed)
Transition of Care [facility]) - Progression Note    Patient Details  Name: Christian [name] MRN: [id number] Date of Birth: 07-Aug-1975  [facility]) CM/SW Contact  Lyden Cellar, RN Phone Number: 02/08/2021, 2:41 PM  Clinical Narrative:    Confirmed with Jinny Sanders patient has bed available at boarding house and can arrive once bariatric bed delivered. Boarding house is at [employer] [place]. Patient will need medications through medication management prior to discharge.   Call to [facility] [employer] who confirmed [employer] is willing to provide charity bariatric bed and Oxygen. Will need patient to place card on file to secure equipment. Anticipate discharge later this week.   Will complete LOG for boarding house.    Expected Discharge Plan: Homeless Shelter Barriers to Discharge: Continued Medical Work up  Expected Discharge Plan and Services Expected Discharge Plan: Homeless Shelter In-house Referral: Clinical Social Work   Post Acute Care Choice: NA Living arrangements for the past 2 months: Homeless Shelter Expected Discharge Date: 01/18/21                                     Social Determinants of Health (SDOH) Interventions    Readmission Risk Interventions Readmission Risk Prevention Plan 10/11/2020  Post Dischage Appt Complete  Medication Screening Complete  Transportation Screening Complete  Some recent data might be hidden

## 2021-02-08 NOTE — Progress Notes (Signed)
Physical Therapy Treatment Patient Details Name: Christian [name] MRN: [id number] DOB: 1975/11/02 Today's Date: 02/08/2021   History of Present Illness Pt is a 45 y/o M admitted on 01/08/21 with c/c of SOB. Pt noted to be positive for Influenza A. Pt noted to have cellulitic area to R buttock. Imaging reveals multifocal PNA in the R lung. PMH: morbid obesity, HTN    PT Comments    Pt ready for session.  Stood with ease and able to walk x 2 laps on unit talking mostly throughout but does need one standing rest break which he reports SOB from talking too much.  Short rest in room for 5 x sit to stand x 2 without UE assist.  Goal is 5 x in 13 seconds for age range.  He is able to complete 4 in 13 seconds.  Static standing before another lap on unit then remains standing until fatigue.  Pt continues to work hard in therapy session and is motivated to improve.  Pt encouraged to ask nursing staff to walk with him during slow times.  Voiced understanding.   Recommendations for follow up therapy are one component of a multi-disciplinary discharge planning process, led by the attending physician.  Recommendations may be updated based on patient status, additional functional criteria and insurance authorization.  Follow Up Recommendations  Outpatient PT     Assistance Recommended at Discharge Set up Supervision/Assistance  Equipment Recommendations  None recommended by PT    Recommendations for Other Services       Precautions / Restrictions Precautions Precautions: Fall Restrictions Weight Bearing Restrictions: No     Mobility  Bed Mobility               General bed mobility comments: In recliner pre/post session    Transfers Overall transfer level: Modified independent Equipment used: Rolling walker (2 wheels);None Transfers: Sit to/from Stand Sit to Stand: Modified independent (Device/Increase time)                Ambulation/Gait Ambulation/Gait assistance:  Supervision Gait Distance (Feet): 330 Feet Assistive device: Rolling walker (2 wheels) Gait Pattern/deviations: Step-through pattern Gait velocity: decreased     General Gait Details: 2 laps then after ex in room, completed an additional lap   Stairs             Wheelchair Mobility    Modified Rankin (Stroke Patients Only)       Balance Overall balance assessment: Needs assistance Sitting-balance support: Feet supported;No upper extremity supported Sitting balance-[name]: Normal     Standing balance support: No upper extremity supported Standing balance-[name]: Good                              Cognition Arousal/Alertness: Awake/alert Behavior During Therapy: WFL for tasks assessed/performed Overall Cognitive Status: Within Functional Limits for tasks assessed                                          Exercises Other Exercises Other Exercises: 5x sit to stand x 2 without UE's  4 stands in 13 seconds. goal is 5 in 13    General Comments        Pertinent Vitals/Pain Pain Assessment: No/denies pain    Home Living  Prior Function            PT Goals (current goals can now be found in the care plan section) Progress towards PT goals: Progressing toward goals    Frequency    Min 2X/week      PT Plan Current plan remains appropriate    Co-evaluation              AM-PAC PT "6 Clicks" Mobility   Outcome Measure  Help needed turning from your back to your side while in a flat bed without using bedrails?: A Little Help needed moving from lying on your back to sitting on the side of a flat bed without using bedrails?: A Little Help needed moving to and from a bed to a chair (including a wheelchair)?: None Help needed standing up from a chair using your arms (e.g., wheelchair or bedside chair)?: None Help needed to walk in hospital room?: A Little Help needed climbing  3-5 steps with a railing? : A Little 6 Click Score: 20    End of Session Equipment Utilized During Treatment: Gait belt Activity Tolerance: Patient tolerated treatment well Patient left: in chair;with call bell/phone within reach Nurse Communication: Mobility status PT Visit Diagnosis: Muscle weakness (generalized) (M62.81)     Time: 7425-9563 PT Time Calculation (min) (ACUTE ONLY): 23 min  Charges:  $Gait Training: 8-22 mins $Therapeutic Activity: 8-22 mins                    Danielle Dess, PTA 02/08/21, 10:31 AM

## 2021-02-08 NOTE — Progress Notes (Signed)
Progress Note    Christian [name]  [id number] DOB: 09-Dec-1975  DOA: 01/08/2021 PCP: Pcp, No      Brief Narrative:    Medical records reviewed and are as summarized below:  Christian [name] is a 45 y.o. male significant for morbid obesity and recent prolonged hospitalization complicated with perforated bowel status post operative repair, depression.  He presented with acute hypoxic respiratory failure and found to have influenza A pneumonia.     Long length of stay, admitted on 01/08/2021.   Difficult disposition due to homelessness, unable to return to shelter, disability application pending      Assessment/Plan:   Principal Problem:   Influenza A Active Problems:   Morbid obesity with BMI of 50.0-59.9, adult (HCC)   Pressure ulcer of buttock   Essential hypertension   Acute respiratory failure with hypoxemia (HCC)   Obesity hypoventilation syndrome (HCC)   Chronic diastolic CHF (congestive heart failure) (HCC)   Cellulitis and abscess of buttock   Prediabetes   Homelessness   Hypokalemia   Sepsis without acute organ dysfunction (HCC)   Nocturnal hypoxia    Body mass index is 57.92 kg/m.  (Morbid obesity)   Nocturnal hypoxemia/suspected obesity hypoventilation syndrome: Continue supplemental oxygen at night.  Outpatient follow-up for sleep study was strongly recommended.    Chronic diastolic CHF: No acute issues continue torsemide  Hypokalemia: BMP reviewed.  Potassium and magnesium are within normal limits.  Continue potassium supplement.   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  Other comorbidities include depression, hypertension  Diet Order             Diet - low sodium heart healthy           Diet Heart  Room service appropriate? Yes; Fluid consistency: Thin  Diet effective now                      Consultants: None  Procedures: None    Medications:    docusate sodium  100 mg Oral BID   enoxaparin (LOVENOX) injection  0.5 mg/kg Subcutaneous Q24H   fluticasone  2 spray Each Nare Daily   guaiFENesin  15 mL Oral QID   potassium chloride  40 mEq Oral TID   sertraline  50 mg Oral Daily   torsemide  20 mg Oral BID   Continuous Infusions:   Anti-infectives (From admission, onward)    Start     Dose/Rate Route Frequency Ordered Stop   01/11/21 2200  oseltamivir (TAMIFLU) capsule 75 mg        75 mg Oral 2 times daily 01/11/21 1317 01/13/21 1041   01/10/21 1030  amoxicillin-clavulanate (AUGMENTIN) 875-125 MG per tablet 1 tablet        1 tablet Oral Every 12 hours 01/10/21 0943 01/14/21 2054   01/09/21 1000  metroNIDAZOLE (FLAGYL) IVPB 500 mg  Status:  Discontinued        500 mg 100 mL/hr over 60 Minutes Intravenous Every 12 hours 01/08/21 2330 01/09/21 0813   01/09/21 1000  oseltamivir (TAMIFLU) capsule 75 mg  Status:  Discontinued        75 mg Oral 2 times daily 01/08/21 2330 01/11/21 1317   01/09/21 0815  ceFAZolin (ANCEF) IVPB 2g/100 mL premix  Status:  Discontinued  2 g 200 mL/hr over 30 Minutes Intravenous Every 8 hours 01/09/21 0809 01/10/21 0943   01/09/21 0800  ceFEPIme (MAXIPIME) 2 g in sodium chloride 0.9 % 100 mL IVPB  Status:  Discontinued        2 g 200 mL/hr over 30 Minutes Intravenous Every 8 hours 01/09/21 0655 01/09/21 0813   01/09/21 0700  vancomycin (VANCOREADY) IVPB 2000 mg/400 mL  Status:  Discontinued        2,000 mg 200 mL/hr over 120 Minutes Intravenous Every 12 hours 01/09/21 0045 01/09/21 0813   01/09/21 0030  vancomycin (VANCOREADY) IVPB 1500 mg/300 mL        1,500 mg 150 mL/hr over 120 Minutes Intravenous  Once 01/09/21 0029 01/09/21 0230   01/08/21 2345  ceFEPIme (MAXIPIME) 2 g in sodium chloride 0.9 % 100 mL IVPB  Status:   Discontinued        2 g 200 mL/hr over 30 Minutes Intravenous Every 24 hours 01/08/21 2330 01/09/21 0655   01/08/21 1915  oseltamivir (TAMIFLU) capsule 75 mg  Status:  Discontinued        75 mg Oral 2 times daily 01/08/21 1910 01/08/21 2330   01/08/21 1815  ceFEPIme (MAXIPIME) 2 g in sodium chloride 0.9 % 100 mL IVPB        2 g 200 mL/hr over 30 Minutes Intravenous  Once 01/08/21 1809 01/08/21 1902   01/08/21 1815  metroNIDAZOLE (FLAGYL) IVPB 500 mg        500 mg 100 mL/hr over 60 Minutes Intravenous  Once 01/08/21 1809 01/08/21 1930   01/08/21 1815  vancomycin (VANCOCIN) IVPB 1000 mg/200 mL premix        1,000 mg 200 mL/hr over 60 Minutes Intravenous  Once 01/08/21 1809 01/08/21 2006              Family Communication/Anticipated D/C date and plan/Code Status   DVT prophylaxis: SCDs Start: 01/08/21 2330     Code Status: Full Code  Family Communication: None Disposition Plan: Awaiting placement to ?boarding house   Status is: Inpatient  Remains inpatient appropriate because: Awaiting placement to SNF           Subjective:   Interval events noted.  No complaints.  He worked with PT today and he feels he is improving though he is still unsteady.   Objective:    Vitals:   02/07/21 0511 02/07/21 0724 02/07/21 2338 02/08/21 0822  BP: 119/75 125/70 112/72 126/75  Pulse: 71 68 73 70  Resp: 18 18 17 18   Temp: 97.8 F (36.6 C) 98.2 F (36.8 C) 98.2 F (36.8 C) 98.4 F (36.9 C)  TempSrc: Oral Oral Oral Oral  SpO2: 96% 96% 93% 92%  Weight:      Height:       No data found.   Intake/Output Summary (Last 24 hours) at 02/08/2021 1428 Last data filed at 02/08/2021 1354 Gross per 24 hour  Intake 840 ml  Output 1800 ml  Net -960 ml   Filed Weights   01/25/21 1348 01/30/21 0500 02/04/21 0505  Weight: (!) 196 kg (!) 194.4 kg (!) 199.1 kg    Exam:  GEN: NAD SKIN: Stage 3 decubitus ulcer on the right upper posterior thigh EYES: EOMI ENT: MMM CV:  RRR PULM: CTA B ABD: soft, obese, NT, +BS CNS: AAO x 3, non focal EXT: No edema or tenderness           Data Reviewed:   I have personally reviewed  following labs and imaging studies:  Labs: Labs show the following:   Basic Metabolic Panel: Recent Labs  Lab 02/03/21 0214 02/05/21 0148 02/08/21 0221  NA 137 137  --   K 3.6 3.8 3.8  CL 97* 97*  --   CO2 33* 32  --   GLUCOSE 95 107*  --   BUN 15 15  --   CREATININE 0.86 0.92  --   CALCIUM 8.7* 8.6*  --   MG  --  2.1 2.1   GFR Estimated Creatinine Clearance: 181.1 mL/min (by C-G formula based on SCr of 0.92 mg/dL). Liver Function Tests: No results for input(s): AST, ALT, ALKPHOS, BILITOT, PROT, ALBUMIN in the last 168 hours. No results for input(s): LIPASE, AMYLASE in the last 168 hours. No results for input(s): AMMONIA in the last 168 hours. Coagulation profile No results for input(s): INR, PROTIME in the last 168 hours.  CBC: No results for input(s): WBC, NEUTROABS, HGB, HCT, MCV, PLT in the last 168 hours. Cardiac Enzymes: No results for input(s): CKTOTAL, CKMB, CKMBINDEX, TROPONINI in the last 168 hours. BNP (last 3 results) No results for input(s): PROBNP in the last 8760 hours. CBG: No results for input(s): GLUCAP in the last 168 hours. D-Dimer: No results for input(s): DDIMER in the last 72 hours. Hgb A1c: No results for input(s): HGBA1C in the last 72 hours. Lipid Profile: No results for input(s): CHOL, HDL, LDLCALC, TRIG, CHOLHDL, LDLDIRECT in the last 72 hours. Thyroid function studies: No results for input(s): TSH, T4TOTAL, T3FREE, THYROIDAB in the last 72 hours.  Invalid input(s): FREET3 Anemia work up: No results for input(s): VITAMINB12, FOLATE, FERRITIN, TIBC, IRON, RETICCTPCT in the last 72 hours. Sepsis Labs: No results for input(s): PROCALCITON, WBC, LATICACIDVEN in the last 168 hours.  Microbiology No results found for this or any previous visit (from the past 240  hour(s)).  Procedures and diagnostic studies:  No results found.             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 If 7PM-7AM, please contact night-coverage at [email or web address]     02/08/2021, 2:28 PM

## 2021-02-08 NOTE — Progress Notes (Signed)
Physical Therapy Treatment Patient Details Name: Christian [name] MRN: [id number] DOB: 09/19/1975 Today's Date: 02/08/2021   History of Present Illness Pt is a 45 y/o M admitted on 01/08/21 with c/c of SOB. Pt noted to be positive for Influenza A. Pt noted to have cellulitic area to R buttock. Imaging reveals multifocal PNA in the R lung. PMH: morbid obesity, HTN    PT Comments    Pt offered and accepted a second PT session today despite just finishing with OT.  Competes 2 laps with ease.  Will continue with stair training next session as he was a bit tired from activity today.  Recommendations for follow up therapy are one component of a multi-disciplinary discharge planning process, led by the attending physician.  Recommendations may be updated based on patient status, additional functional criteria and insurance authorization.  Follow Up Recommendations  Outpatient PT     Assistance Recommended at Discharge Set up Supervision/Assistance  Equipment Recommendations  None recommended by PT    Recommendations for Other Services       Precautions / Restrictions Precautions Precautions: Fall Restrictions Weight Bearing Restrictions: No     Mobility  Bed Mobility               General bed mobility comments: Pt recieved/left in recliner    Transfers Overall transfer level: Modified independent Equipment used: Rolling walker (2 wheels) Transfers: Sit to/from Stand Sit to Stand: Modified independent (Device/Increase time)     Step pivot transfers: Supervision          Ambulation/Gait Ambulation/Gait assistance: Supervision Gait Distance (Feet): 330 Feet Assistive device: Rolling walker (2 wheels) Gait Pattern/deviations: Step-through pattern Gait velocity: decreased     General Gait Details: 2 laps   Stairs             Wheelchair Mobility    Modified Rankin (Stroke Patients Only)       Balance Overall balance assessment: Needs  assistance Sitting-balance support: Feet supported;No upper extremity supported Sitting balance-[name]: Normal     Standing balance support: No upper extremity supported Standing balance-[name]: Good                              Cognition Arousal/Alertness: Awake/alert Behavior During Therapy: WFL for tasks assessed/performed Overall Cognitive Status: Within Functional Limits for tasks assessed                                          Exercises Other Exercises Other Exercises: Pt educ re: OT role, HEP program, goals for re-eval Other Exercises: HEP- chest pulls, shoulder flexion, biceps, triceps x10    General Comments        Pertinent Vitals/Pain Pain Assessment: No/denies pain    Home Living                          Prior Function            PT Goals (current goals can now be found in the care plan section) Progress towards PT goals: Progressing toward goals    Frequency    Min 2X/week      PT Plan Current plan remains appropriate    Co-evaluation              AM-PAC PT "6 Clicks" Mobility  Outcome Measure  Help needed turning from your back to your side while in a flat bed without using bedrails?: A Little Help needed moving from lying on your back to sitting on the side of a flat bed without using bedrails?: A Little Help needed moving to and from a bed to a chair (including a wheelchair)?: None Help needed standing up from a chair using your arms (e.g., wheelchair or bedside chair)?: None Help needed to walk in hospital room?: A Little Help needed climbing 3-5 steps with a railing? : A Little 6 Click Score: 20    End of Session Equipment Utilized During Treatment: Gait belt Activity Tolerance: Patient tolerated treatment well Patient left: in chair;with call bell/phone within reach Nurse Communication: Mobility status PT Visit Diagnosis: Muscle weakness (generalized) (M62.81)     Time:  5956-3875 PT Time Calculation (min) (ACUTE ONLY): 8 min  Charges:  $Gait Training: 8-22 mins                    Danielle Dess, PTA 02/08/21, 3:17 PM

## 2021-02-09 ENCOUNTER — Other Ambulatory Visit: Payer: Self-pay

## 2021-02-09 MED ORDER — TORSEMIDE 20 MG PO TABS
20.0000 mg | ORAL_TABLET | Freq: Two times a day (BID) | ORAL | 0 refills | Status: DC
  Filled 2021-02-09: qty 60, 30d supply, fill #0

## 2021-02-09 MED ORDER — POTASSIUM CHLORIDE CRYS ER 20 MEQ PO TBCR
20.0000 meq | EXTENDED_RELEASE_TABLET | Freq: Two times a day (BID) | ORAL | 0 refills | Status: DC
  Filled 2021-02-09: qty 60, 30d supply, fill #0

## 2021-02-09 NOTE — TOC CM/SW Note (Signed)
    Durable Medical Equipment  (From admission, onward)           Start     Ordered   02/09/21 0000  For home use only DME Hospital bed       Comments: Bariatric  Question Answer Comment  Length of Need Lifetime   Bed type Semi-electric      02/09/21 0814   02/09/21 0000  For home use only DME oxygen       Question Answer Comment  Length of Need Lifetime   Liters per Minute 2   Frequency Only at night (stationary unit needed)   Oxygen delivery system Gas      02/09/21 0814   01/26/21 1002  For home use only DME oxygen  Once       Question Answer Comment  Length of Need Lifetime   Mode or (Route) Nasal cannula   Liters per Minute 2   Frequency Only at night (stationary unit needed)   Oxygen conserving device Yes   Oxygen delivery system Gas      01/26/21 1002

## 2021-02-09 NOTE — TOC Progression Note (Signed)
Transition of Care [facility]) - Progression Note    Patient Details  Name: Arnet Hofferber MRN: [id number] Date of Birth: Dec 27, 1975  [facility] CM/SW Contact  Chapman Fitch, RN Phone Number: 02/09/2021, 1:51 PM  Clinical Narrative:     Patient does not qualify for continuous O2.  Over night pulse ox results from 11/28 and no longer valid.   Will need repeat overnight pulse ox on RA tonight. MD and bedside RN notified  Patient signed rider waiver form in anticipation for DC tomorrow   Expected Discharge Plan: Homeless Shelter Barriers to Discharge: Continued Medical Work up  Expected Discharge Plan and Services Expected Discharge Plan: Homeless Shelter In-house Referral: Clinical Social Work   Post Acute Care Choice: NA Living arrangements for the past 2 months: Homeless Shelter Expected Discharge Date: 02/09/21                                     Social Determinants of Health (SDOH) Interventions    Readmission Risk Interventions Readmission Risk Prevention Plan 10/11/2020  Post Dischage Appt Complete  Medication Screening Complete  Transportation Screening Complete  Some recent data might be hidden

## 2021-02-09 NOTE — TOC Progression Note (Signed)
Transition of Care [facility]) - Progression Note    Patient Details  Name: Christian [name] MRN: [id number] Date of Birth: 05-02-1975  [facility]) CM/SW Contact  Chapman Fitch, RN Phone Number: 02/09/2021, 9:17 AM  Clinical Narrative:     Pamelia Hoit with [employer] aware that DME orders are in Bedside RN to obtain qualifying sats   Expected Discharge Plan: Homeless Shelter Barriers to Discharge: Continued Medical Work up  Expected Discharge Plan and Services Expected Discharge Plan: Homeless Shelter In-house Referral: Clinical Social Work   Post Acute Care Choice: NA Living arrangements for the past 2 months: Homeless Shelter Expected Discharge Date: 01/18/21                                     Social Determinants of Health (SDOH) Interventions    Readmission Risk Interventions Readmission Risk Prevention Plan 10/11/2020  Post Dischage Appt Complete  Medication Screening Complete  Transportation Screening Complete  Some recent data might be hidden

## 2021-02-09 NOTE — Progress Notes (Signed)
SATURATION QUALIFICATIONS: (This note is used to comply with regulatory documentation for home oxygen)  Patient Saturations on Room Air at Rest = 93%  Patient Saturations on Room Air while Ambulating = 90%   Please briefly explain why patient needs home oxygen:Patient did not qualify for oxygen while ambulating

## 2021-02-09 NOTE — Discharge Summary (Addendum)
Physician Discharge Summary  Christian [name] [id number] DOB: Mar 24, 1975 DOA: 01/08/2021  PCP: Pcp, No  Admit date: 01/08/2021 Discharge date: 02/12/2021  Discharge disposition: Home/boarding house   Recommendations for Outpatient Follow-Up:   Follow-up with PCP in 1 week.  Outpatient sleep study recommended for evaluation for obstructive sleep apnea.   Check BMP in 1-2 weeks.   Adjust potassium supplementation if needed.   Discharge Diagnosis:   Principal Problem:   Influenza A Active Problems:   Morbid obesity with BMI of 50.0-59.9, adult (HCC)   Pressure ulcer of buttock   Essential hypertension   Acute respiratory failure with hypoxemia (HCC)   Obesity hypoventilation syndrome (HCC)   Chronic diastolic CHF (congestive heart failure) (HCC)   Cellulitis and abscess of buttock   Prediabetes   Homelessness   Hypokalemia   Sepsis without acute organ dysfunction (HCC)   Nocturnal hypoxia    Discharge Condition: Stable.  Diet recommendation:  Diet Order             Diet - low sodium heart healthy           Diet Heart Room service appropriate? Yes; Fluid consistency: Thin  Diet effective now                     Code Status: Full Code     Hospital Course:   Christian [name] is a 45 y.o. male significant for morbid obesity, chronic diastolic CHF, recent prolonged hospitalization for perforated bowel status post operative repair, depression.  He presented to the hospital with shortness of breath and oxygen desaturation (80%).  He was found to have sepsis secondary to influenza A pneumonia and acute hypoxemic respiratory failure. He was treated with Tamiflu.  Initially, he required BiPAP for acute hypoxemic respiratory failure.  He was subsequently weaned off of BiPAP and was tolerating oxygen via nasal cannula.  Obesity hypoventilation syndrome was suspected as well because of morbid obesity, nocturnal hypoxemia and chronic metabolic alkalosis.   Overnight oximetry confirmed nocturnal hypoxemia and will be discharged on 2 L/min oxygen for nighttime use.  He has stage III decubitus ulcer on the right posterior upper thigh, and he developed cellulitis and abscess that was successfully treated with antibiotics.  Hospital stay was prolonged because of homelessness and difficult disposition.  His condition has improved and is deemed stable for discharge today.   12/15 - nocturnal oximetry study was performed overnight, patient again qualifies for home nocturnal oxygen.  Stable for discharge today.    Discharge Exam:    Vitals:   02/08/21 1515 02/08/21 2029 02/09/21 0425 02/09/21 0815  BP: (!) 153/78 136/86 121/69 137/80  Pulse: 83 75 69 69  Resp: 20 20 20 16   Temp: 98 F (36.7 C) 98.6 F (37 C) 97.7 F (36.5 C) 97.6 F (36.4 C)  TempSrc: Oral Oral Oral   SpO2: 96% 93% 96% 97%  Weight:      Height:         GEN: NAD SKIN: Warm and dry.  Stage III decubitus ulcer on posterior aspect of right upper thigh EYES: No pallor or icterus ENT: MMM CV: RRR PULM: CTA B ABD: soft, obese, NT, +BS CNS: AAO x 3, non focal EXT: No edema or tenderness   The results of significant diagnostics from this hospitalization (including imaging, microbiology, ancillary and laboratory) are listed below for reference.     Procedures and Diagnostic Studies:   CT Angio Chest Pulmonary Embolism (PE) W  or WO Contrast  Result Date: 01/09/2021 CLINICAL DATA:  Hypoxia and shortness of breath.  Elevated D-dimer. EXAM: CT ANGIOGRAPHY CHEST WITH CONTRAST TECHNIQUE: Multidetector CT imaging of the chest was performed using the standard protocol during bolus administration of intravenous contrast. Multiplanar CT image reconstructions and MIPs were obtained to evaluate the vascular anatomy. CONTRAST:  OMNIPAQUE IOHEXOL 350 MG/ML SOLN COMPARISON:  Chest x-ray from yesterday. FINDINGS: Cardiovascular: Suboptimal opacification of the segmental pulmonary  arteries. No central or lobar pulmonary embolism. Normal heart size. No pericardial effusion. No thoracic aortic aneurysm or dissection. Mediastinum/Nodes: Few mildly enlarged mediastinal and right hilar lymph nodes measuring up to 1.1 cm in short axis, likely reactive. No enlarged axillary lymph nodes. Thyroid gland, trachea, and esophagus demonstrate no significant findings. Lungs/Pleura: Prominent right middle and lower lobe peribronchial thickening and peribronchovascular consolidation with scattered patchy irregular opacities. This involves the posterior right upper lobe to lesser extent as well. Trace right pleural effusion. Linear scarring in the right upper lobe. 5 mm pulmonary nodule in the right lower lobe (series 7, image 63). 7 mm pulmonary nodule in the left lower lobe (series 7, image 76). No pneumothorax. Upper Abdomen: No acute abnormality. Musculoskeletal: No chest wall abnormality. No acute or significant osseous findings. Review of the MIP images confirms the above findings. IMPRESSION: 1. Suboptimal opacification of the segmental pulmonary arteries. No central or lobar pulmonary embolism. 2. Multifocal pneumonia in the right lung. 3. Trace right pleural effusion. 4. Bilateral pulmonary nodules measuring up to 7 mm. Recommend a non-contrast Chest CT at 3-6 months. If patient is high risk for malignancy, recommend an additional non-contrast Chest CT at 18-24 months; if patient is low risk for malignancy a non-contrast Chest CT at 18-24 months is optional. These guidelines do not apply to immunocompromised patients and patients with cancer. Follow up in patients with significant comorbidities as clinically warranted. For lung cancer screening, adhere to Lung-RADS guidelines. Reference: Radiology. 2017; 284(1):228-43. Electronically Signed   By: Obie Dredge M.D.   On: 01/09/2021 17:31   CT ABDOMEN PELVIS W CONTRAST  Result Date: 01/08/2021 CLINICAL DATA:  Right lower quadrant pain EXAM: CT  ABDOMEN AND PELVIS WITH CONTRAST TECHNIQUE: Multidetector CT imaging of the abdomen and pelvis was performed using the standard protocol following bolus administration of intravenous contrast. CONTRAST:  OMNIPAQUE IOHEXOL 300 MG/ML  SOLN COMPARISON:  CT 10/15/2020 FINDINGS: Lower chest: Lung bases demonstrate patchy densities in the right lung base. There is a small right-sided pleural effusion. Hepatobiliary: No focal liver abnormality is seen. No gallstones, gallbladder wall thickening, or biliary dilatation. Pancreas: Unremarkable. No pancreatic ductal dilatation or surrounding inflammatory changes. Spleen: Normal in size without focal abnormality. Adrenals/Urinary Tract: Adrenal glands are normal. Cyst in the left kidney. No hydronephrosis. Delayed excretion of contrast consistent with decreased renal function. Bladder normal Stomach/Bowel: Stomach nonenlarged. No dilated small bowel. Negative appendix. No acute bowel wall thickening. Vascular/Lymphatic: Nonaneurysmal aorta. No suspicious nodes. There are multiple enlarged right inguinal lymph nodes. Reproductive: Prostate is unremarkable. Other: Negative for pelvic effusion or free air. Small fat containing left inguinal hernia. Heterogenous density with internal fat slightly to the left of midline within the upper pelvis measuring 5.4 cm, series 2, image 79. Musculoskeletal: No acute osseous abnormality IMPRESSION: 1. Negative for acute appendicitis. 2. Heterogenous mass with internal fat density slightly to the left of midline in the lower abdomen/upper pelvis. Findings could be secondary to fat necrosis and postsurgical change. Other consideration could include omental infarct. 3. Small  right pleural effusion with patchy heterogeneous densities in the right base suspicious for respiratory infection/pneumonia 4. Delayed excretion of contrast from kidneys consistent with decreased renal function. Correlate with appropriate laboratory values. 5. Multiple  enlarged right inguinal lymph nodes Electronically Signed   By: Jasmine PangKim  Fujinaga M.D.   On: 01/08/2021 21:43   CT FEMUR RIGHT W CONTRAST  Result Date: 01/08/2021 CLINICAL DATA:  Soft tissue infection cellulitis with wound EXAM: CT OF THE LOWER RIGHT EXTREMITY WITH CONTRAST TECHNIQUE: Multidetector CT imaging of the lower right extremity was performed according to the standard protocol following intravenous contrast administration. CONTRAST:  100mL OMNIPAQUE IOHEXOL 300 MG/ML  SOLN COMPARISON:  None. FINDINGS: Bones/Joint/Cartilage No fracture or malalignment. No significant right hip effusion. No periostitis or bony destructive change. Ligaments Suboptimally assessed by CT. Muscles and Tendons No intramuscular fluid collections.  No significant atrophy. Soft tissues Skin thickening of the right gluteal region. Negative for focal fluid collection to suggest soft tissue abscess. Mild nonspecific subcutaneous edema within the right thigh. IMPRESSION: 1. No acute osseous abnormality 2. Prominent skin thickening over the right gluteal region. Negative for soft tissue abscess. Nonspecific mild subcutaneous edema within the right thigh and gluteal region. Electronically Signed   By: Jasmine PangKim  Fujinaga M.D.   On: 01/08/2021 21:33   US Venous Img Lower Bilateral (DVT)  Result Date: 01/09/2021 CLINICAL DATA:  Positive D-dimer. Shortness of breath with cellulitis. EXAM: BILATERAL LOWER EXTREMITY VENOUS DOPPLER ULTRASOUND TECHNIQUE: Gray-scale sonography with compression, as well as color and duplex ultrasound, were performed to evaluate the deep venous system(s) from the level of the common femoral vein through the popliteal and proximal calf veins. COMPARISON:  None. FINDINGS: VENOUS Normal compressibility of the common femoral, superficial femoral, and popliteal veins, as well as the visualized calf veins. Visualized portions of profunda femoral vein and great saphenous vein unremarkable. No filling defects to suggest DVT  on grayscale or color Doppler imaging. Doppler waveforms show normal direction of venous flow, normal respiratory plasticity and response to augmentation. The peroneal veins could not be visualized on either side, likely due to body habitus. IMPRESSION: Negative for DVT in the lower extremities. Electronically Signed   By: Tiburcio PeaJonathan  Watts M.D.   On: 01/09/2021 09:53   DG Chest Port 1 View  Result Date: 01/08/2021 CLINICAL DATA:  Questionable sepsis - evaluate for abnormality EXAM: PORTABLE CHEST 1 VIEW COMPARISON:  Chest radiograph 12/09/2020 FINDINGS: Unchanged cardiomediastinal silhouette. There are diffuse interstitial opacities and right greater than left mid to lower lung opacities. There is a small right pleural effusion. No visible pneumothorax. There is no acute osseous abnormality. IMPRESSION: Diffuse interstitial opacities and bilateral mid to lower lung opacities, right greater than left, favored to represent pulmonary edema. Multifocal infection is possible. Small right pleural effusion. Electronically Signed   By: Caprice RenshawJacob  Kahn M.D.   On: 01/08/2021 18:48     Labs:   Basic Metabolic Panel: Recent Labs  Lab 02/03/21 0214 02/05/21 0148 02/08/21 0221  NA 137 137  --   K 3.6 3.8 3.8  CL 97* 97*  --   CO2 33* 32  --   GLUCOSE 95 107*  --   BUN 15 15  --   CREATININE 0.86 0.92  --   CALCIUM 8.7* 8.6*  --   MG  --  2.1 2.1   GFR Estimated Creatinine Clearance: 181.1 mL/min (by C-G formula based on SCr of 0.92 mg/dL). Liver Function Tests: No results for input(s): AST, ALT, ALKPHOS, BILITOT, PROT,  ALBUMIN in the last 168 hours. No results for input(s): LIPASE, AMYLASE in the last 168 hours. No results for input(s): AMMONIA in the last 168 hours. Coagulation profile No results for input(s): INR, PROTIME in the last 168 hours.  CBC: No results for input(s): WBC, NEUTROABS, HGB, HCT, MCV, PLT in the last 168 hours. Cardiac Enzymes: No results for input(s): CKTOTAL, CKMB,  CKMBINDEX, TROPONINI in the last 168 hours. BNP: Invalid input(s): POCBNP CBG: No results for input(s): GLUCAP in the last 168 hours. D-Dimer No results for input(s): DDIMER in the last 72 hours. Hgb A1c No results for input(s): HGBA1C in the last 72 hours. Lipid Profile No results for input(s): CHOL, HDL, LDLCALC, TRIG, CHOLHDL, LDLDIRECT in the last 72 hours. Thyroid function studies No results for input(s): TSH, T4TOTAL, T3FREE, THYROIDAB in the last 72 hours.  Invalid input(s): FREET3 Anemia work up No results for input(s): VITAMINB12, FOLATE, FERRITIN, TIBC, IRON, RETICCTPCT in the last 72 hours. Microbiology No results found for this or any previous visit (from the past 240 hour(s)).   Discharge Instructions:   Discharge Instructions     Diet - low sodium heart healthy   Complete by: As directed    Discharge instructions   Complete by: As directed    Outpatient sleep study for evaluation for obstructive sleep apnea recommended   Discharge wound care:   Complete by: As directed    Apply calcium alginate dressing. Top with ABD pad and secure with Medipore tape.  Change daily.   Discharge wound care:   Complete by: As directed    Wound care to right posterior thigh Stage 3 pressure injury, POA: Cleanse with NS, pat dry. Apply silver hydrofiber dressing, Aquacel Ag+ Advantage, Kellie Simmering [phone] Top with ABD pad and secure with Medipore tape.  Change daily. Turn patient side to side and minimize time in the supine position. Keep HOB at or below a 30 degree angle.   For home use only DME Hospital bed   Complete by: As directed    Bariatric   Length of Need: Lifetime   Bed type: Semi-electric   For home use only DME oxygen   Complete by: As directed    Length of Need: Lifetime   Liters per Minute: 2   Frequency: Only at night (stationary unit needed)   Oxygen delivery system: Gas   Increase activity slowly   Complete by: As directed    Increase activity slowly   Complete  by: As directed       Allergies as of 02/09/2021       Reactions   Shellfish Allergy Nausea Only        Medication List     STOP taking these medications    cholecalciferol 10 MCG (400 UNIT) Tabs tablet Commonly known as: VITAMIN D3   furosemide 20 MG tablet Commonly known as: LASIX       TAKE these medications    Algicell Calcium Dressing 4"x4 Misc Apply 1 application topically once daily at 12 noon.   docusate sodium 100 MG capsule Commonly known as: COLACE Take 1 capsule (100 mg total) by mouth 2 (two) times daily.   IFerex 150 150 MG capsule Generic drug: iron polysaccharides Take 1 capsule (150 mg total) by mouth once daily.   potassium chloride SA 20 MEQ tablet Commonly known as: KLOR-CON M Take 1 tablet (20 mEq total) by mouth 2 (two) times daily.   sertraline 50 MG tablet Commonly known as: ZOLOFT Take 1 tablet (  50 mg total) by mouth once daily.   torsemide 20 MG tablet Commonly known as: DEMADEX Take 1 tablet (20 mg total) by mouth 2 (two) times daily.   vitamin B-12 100 MCG tablet Commonly known as: CYANOCOBALAMIN Take 1 tablet (100 mcg total) by mouth once daily.               Durable Medical Equipment  (From admission, onward)           Start     Ordered   02/09/21 0000  For home use only DME Hospital bed       Comments: Bariatric  Question Answer Comment  Length of Need Lifetime   Bed type Semi-electric      02/09/21 0814   02/09/21 0000  For home use only DME oxygen       Question Answer Comment  Length of Need Lifetime   Liters per Minute 2   Frequency Only at night (stationary unit needed)   Oxygen delivery system Gas      02/09/21 0814   01/26/21 1002  For home use only DME oxygen  Once       Question Answer Comment  Length of Need Lifetime   Mode or (Route) Nasal cannula   Liters per Minute 2   Frequency Only at night (stationary unit needed)   Oxygen conserving device Yes   Oxygen delivery system Gas       01/26/21 1002              Discharge Care Instructions  (From admission, onward)           Start     Ordered   02/09/21 0000  Discharge wound care:       Comments: Wound care to right posterior thigh Stage 3 pressure injury, POA: Cleanse with NS, pat dry. Apply silver hydrofiber dressing, Aquacel Ag+ Advantage, Kellie Simmering [phone] Top with ABD pad and secure with Medipore tape.  Change daily. Turn patient side to side and minimize time in the supine position. Keep HOB at or below a 30 degree angle.   02/09/21 1048   01/18/21 0000  Discharge wound care:       Comments: Apply calcium alginate dressing. Top with ABD pad and secure with Medipore tape.  Change daily.   01/18/21 1306            Follow-up Information     PCP. Schedule an appointment as soon as possible for a visit in 1 week(s).                    If you experience worsening of your admission symptoms, develop shortness of breath, life threatening emergency, suicidal or homicidal thoughts you must seek medical attention immediately by calling 911 or calling your MD immediately  if symptoms less severe.   You must read complete instructions/literature along with all the possible adverse reactions/side effects for all the medicines you take and that have been prescribed to you. Take any new medicines after you have completely understood and accept all the possible adverse reactions/side effects.    Please note   You were cared for by a hospitalist during your hospital stay. If you have any questions about your discharge medications or the care you received while you were in the hospital after you are discharged, you can call the unit and asked to speak with the hospitalist on call if the hospitalist that took care of you is not available. Once you  are discharged, your primary care physician will handle any further medical issues. Please note that NO REFILLS for any discharge medications will be authorized once  you are discharged, as it is imperative that you return to your primary care physician (or establish a relationship with a primary care physician if you do not have one) for your aftercare needs so that they can reassess your need for medications and monitor your lab values.       Time coordinating discharge: 36 minutes  Signed:  BERNARD AYIKU  [facility] 02/09/2021, 10:50 AM   Pager on [email or web address]. If 7PM-7AM, please contact night-coverage at [email or web address]

## 2021-02-09 NOTE — Progress Notes (Signed)
Physical Therapy Re-Evaluation Patient Details Name: Christian [name] MRN: [id number] DOB: [birth year] Today's Date: 02/09/2021   History of Present Illness Pt is a 45 y/o M admitted on 01/08/21 with c/c of SOB. Pt noted to be positive for Influenza A. Pt noted to have cellulitic area to R buttock. Imaging reveals multifocal PNA in the R lung. PMH: morbid obesity, HTN    PT Comments    Patient was alert, in bed, agreeable to PT. Seen as a re-evaluation and goals updated as needed. 5 times sit to stand and 6 MWT to be performed next session. The patient continues to demonstrate great progress towards goals and outpatient PT remains appropriate. ModI for bed mobility and transfers, able to don/doff shoes independently. Stair training performed with supervision and 1 rail at pt request, able to do so safely and with correct technique. The patient would benefit from further skilled PT intervention to continue to progress towards goals. Recommendation remains appropriate.    Recommendations for follow up therapy are one component of a multi-disciplinary discharge planning process, led by the attending physician.  Recommendations may be updated based on patient status, additional functional criteria and insurance authorization.  Follow Up Recommendations  Outpatient PT     Assistance Recommended at Discharge Set up Supervision/Assistance  Equipment Recommendations  None recommended by PT    Recommendations for Other Services OT consult     Precautions / Restrictions Precautions Precautions: Fall Precaution Comments: droplet Restrictions Weight Bearing Restrictions: No     Mobility  Bed Mobility Overal bed mobility: Modified Independent                  Transfers Overall transfer level: Modified independent Equipment used: Rolling walker (2 wheels) Transfers: Sit to/from Stand Sit to Stand: Modified independent (Device/Increase time)                 Ambulation/Gait Ambulation/Gait assistance: Supervision Gait Distance (Feet): 400 Feet Assistive device: Rolling walker (2 wheels)             Stairs Stairs: Yes Stairs assistance: Supervision Stair Management: One rail Right;Sideways;Backwards;Step to pattern   General stair comments: a total of 12 steps performed, but performed in 2 rounds of 6   Wheelchair Mobility    Modified Rankin (Stroke Patients Only)       Balance Overall balance assessment: Needs assistance Sitting-balance support: Feet supported;No upper extremity supported Sitting balance-[name]: Normal     Standing balance support: No upper extremity supported Standing balance-[name]: Good                              Cognition Arousal/Alertness: Awake/alert Behavior During Therapy: WFL for tasks assessed/performed Overall Cognitive Status: Within Functional Limits for tasks assessed                                          Exercises      General Comments        Pertinent Vitals/Pain Pain Assessment: No/denies pain    Home Living                          Prior Function            PT Goals (current goals can now be found in the care plan section) Additional Goals  Additional Goal #2: The patient will perform the 6 MWT and ambulate at least 1074ft indicating unlimited community ambulator. Progress towards PT goals: Progressing toward goals    Frequency    Min 2X/week      PT Plan Current plan remains appropriate    Co-evaluation              AM-PAC PT "6 Clicks" Mobility   Outcome Measure  Help needed turning from your back to your side while in a flat bed without using bedrails?: A Little Help needed moving from lying on your back to sitting on the side of a flat bed without using bedrails?: None Help needed moving to and from a bed to a chair (including a wheelchair)?: None Help needed standing up from a chair  using your arms (e.g., wheelchair or bedside chair)?: None Help needed to walk in hospital room?: None Help needed climbing 3-5 steps with a railing? : A Little 6 Click Score: 22    End of Session Equipment Utilized During Treatment: Gait belt Activity Tolerance: Patient tolerated treatment well Patient left: in chair;with call bell/phone within reach   PT Visit Diagnosis: Muscle weakness (generalized) (M62.81)     Time: 0354-6568 PT Time Calculation (min) (ACUTE ONLY): 23 min  Charges:  $Gait Training: 8-22 mins $Therapeutic Exercise: 8-22 mins                     Olga Coaster PT, DPT 11:05 AM,02/09/21

## 2021-02-10 NOTE — TOC Progression Note (Signed)
Transition of Care [facility]) - Progression Note    Patient Details  Name: Lennis Korb MRN: [id number] Date of Birth: 1975-06-27  Transition of [facility]) CM/SW Contact  Margarito Liner, LCSW Phone Number: 02/10/2021, 3:33 PM  Clinical Narrative:   [facility] to try and schedule new PCP appointment but earliest they had was February 16. They can schedule patient at their other location: Primary Care at [facility] in [place]. Appointment with Dr. [name] scheduled for Friday 1/27 at 8:00 am. Information added to AVS.  Expected Discharge Plan: Homeless Shelter Barriers to Discharge: Continued Medical Work up  Expected Discharge Plan and Services Expected Discharge Plan: Homeless Shelter In-house Referral: Clinical Social Work   Post Acute Care Choice: NA Living arrangements for the past 2 months: Homeless Shelter Expected Discharge Date: 02/09/21                                     Social Determinants of Health (SDOH) Interventions    Readmission Risk Interventions Readmission Risk Prevention Plan 10/11/2020  Post Dischage Appt Complete  Medication Screening Complete  Transportation Screening Complete  Some recent data might be hidden

## 2021-02-10 NOTE — Progress Notes (Signed)
Telemetry box off again; Switched out for [id number]; verified with CCMD; functioning properly at this time. Will continue to monitor. Windy Carina, RN 11:02 PM 02/10/2021

## 2021-02-10 NOTE — Progress Notes (Signed)
°  Progress Note    Christian [name]   MWU:[id number]  DOB: May 04, 1975  DOA: 01/08/2021     32 Date of Service: 02/10/2021      Discharge 12/13 delayed by need for repeat nocturnal pulse ox study as previous study no longer valid.  Study unable to be done last night due to lack on equipment.  Anticipate d/c tomorrow if overnight pulse ox study able to be done tonight.   Brief Narrative - per D/C summary by Dr. Myriam Forehand 12/13 "Christian [name] is a 45 y.o. male significant for morbid obesity, chronic diastolic CHF, recent prolonged hospitalization for perforated bowel status post operative repair, depression.  He presented to the hospital with shortness of breath and oxygen desaturation (80%).  He was found to have sepsis secondary to influenza A pneumonia and acute hypoxemic respiratory failure. He was treated with Tamiflu.  Initially, he required BiPAP for acute hypoxemic respiratory failure.  He was subsequently weaned off of BiPAP and was tolerating oxygen via nasal cannula.  Obesity hypoventilation syndrome was suspected as well because of morbid obesity, nocturnal hypoxemia and chronic metabolic alkalosis.  Overnight oximetry confirmed nocturnal hypoxemia and will be discharged on 2 L/min oxygen for nighttime use.   He has stage III decubitus ulcer on the right posterior upper thigh, and he developed cellulitis and abscess that was successfully treated with antibiotics.  Hospital stay was prolonged because of homelessness and difficult disposition.  His condition has improved and is deemed stable for discharge today."    Assessment and Plan  See discharge summary and/or progress note of 12/12.  --Nocturnal oximetry to re-qualify for home nocturnal oxygen needed for discharge.    Subjective:  Pt up in recliner.  Working with therapy more, doing better with ambulating, says he did stairs today.  No acute complaints.   Objective Vitals:   02/09/21 1519 02/09/21 1937 02/10/21  0448 02/10/21 0743  BP: 134/84 123/78 119/65 126/73  Pulse: 72 74 71 68  Resp: 16 20 20 18   Temp: 97.9 F (36.6 C) 98.2 F (36.8 C) 97.6 F (36.4 C) 98.6 F (37 C)  TempSrc:  Oral Oral   SpO2: 98% 96% 97% 96%  Weight:      Height:       (!) 199.1 kg  Vital signs were reviewed and unremarkable.   Exam General exam: awake, alert, no acute distress, obese Respiratory system: On room air, symmetric chest rise, normal respiratory effort. Cardiovascular system:  RRR, no peripheral edema.   Central nervous system: A&O x 3. no gross focal neurologic deficits, normal speech Skin: dry, intact, normal temperature Psychiatry: normal mood, congruent affect, judgement and insight appear normal    Labs / Other Information There are no new results to review at this time.   Disposition Plan: Status is: Inpatient  Remains inpatient appropriate because: Requires nocturnal oxygen, prior study no longer valid to arrange this for discharge.  Repeat overnight oximetry tonight and anticipate discharge tomorrow       Time spent: 20 minutes [facility] 02/10/2021, 2:21 PM

## 2021-02-10 NOTE — Progress Notes (Signed)
Respiratory informed me that they could not do overnight oximetry on Christian [name] because there was only one machine and a patient in ICU had an order placed before Christian [name].  Christian [name], RT, also informed me that she could do it tomorrow night. The patient had one done on Nov. 28th.   Lennon Alstrom N  02/10/2021  7:01 AM

## 2021-02-10 NOTE — Progress Notes (Signed)
CCMD called to say that they can not see any O2 sat on this patient. Noted at shift change that telemetry box was off; attempted to turn on and would not stay on; Box AR2C-X08 removed from patient and Box AR2C-X11 applied; confirmed with CCMD; O2 sat at 93% RA; Windy Carina, RN 8:52 PM 02/10/2021

## 2021-02-11 ENCOUNTER — Other Ambulatory Visit: Payer: Self-pay

## 2021-02-11 MED ORDER — GUAIFENESIN 100 MG/5ML PO LIQD
15.0000 mL | Freq: Four times a day (QID) | ORAL | 0 refills | Status: DC | PRN
  Filled 2021-02-11: qty 120, 2d supply, fill #0

## 2021-02-11 MED ORDER — FLUTICASONE PROPIONATE 50 MCG/ACT NA SUSP
2.0000 | Freq: Every day | NASAL | 2 refills | Status: DC
  Filled 2021-02-11: qty 16, 30d supply, fill #0
  Filled 2021-03-08 – 2021-03-11 (×2): qty 16, 30d supply, fill #1
  Filled 2021-03-15: qty 16, 30d supply, fill #0
  Filled 2022-02-03: qty 16, 30d supply, fill #1

## 2021-02-11 MED ORDER — TORSEMIDE 20 MG PO TABS
20.0000 mg | ORAL_TABLET | Freq: Two times a day (BID) | ORAL | 1 refills | Status: DC
  Filled 2021-02-11 – 2021-03-15 (×5): qty 60, 30d supply, fill #0

## 2021-02-11 MED ORDER — POTASSIUM CHLORIDE CRYS ER 20 MEQ PO TBCR
40.0000 meq | EXTENDED_RELEASE_TABLET | Freq: Three times a day (TID) | ORAL | 1 refills | Status: DC
  Filled 2021-02-11 – 2021-03-04 (×3): qty 60, 10d supply, fill #0

## 2021-02-11 NOTE — TOC Progression Note (Addendum)
Transition of Care [facility]) - Progression Note    Patient Details  Name: Christian [name] MRN: [id number] Date of Birth: November 29, 1975  Transition of Care [employer]) CM/SW Contact  Margarito Liner, LCSW Phone Number: 02/11/2021, 2:14 PM  Clinical Narrative:   Per [employer] representative, patient's DME has been assigned for delivery but not delivered yet.  2:47 pm: Boarding house owner will call CSW once DME has been delivered.  3:20 pm: Pharmacist at [facility] will call CSW when medications are ready.  4:37 pm: Medications delivered to the room. Printed off information on [employer] [facility] system and put in discharge packet.  5:11 PM: Boarding house owner heard from [employer] delivery driver around 3:55 stating that he was waiting on his partner to help him bring the bed in and set it up. She had not heard anything else by 4:30 and said she was in a group and would check in 30 minutes. No call back yet. Called [facility] around 4:40 to see how late they take calls but went to voicemail. Will delay discharge until the morning.  Expected Discharge Plan: Homeless Shelter Barriers to Discharge: Continued Medical Work up  Expected Discharge Plan and Services Expected Discharge Plan: Homeless Shelter In-house Referral: Clinical Social Work   Post Acute Care Choice: NA Living arrangements for the past 2 months: Homeless Shelter Expected Discharge Date: 02/09/21                                     Social Determinants of Health (SDOH) Interventions    Readmission Risk Interventions Readmission Risk Prevention Plan 10/11/2020  Post Dischage Appt Complete  Medication Screening Complete  Transportation Screening Complete  Some recent data might be hidden

## 2021-02-11 NOTE — Progress Notes (Addendum)
Mobility Specialist - Progress Note   02/11/21 1100  Mobility  Activity Ambulated in hall  Level of Assistance Standby assist, set-up cues, supervision of patient - no hands on  Assistive Device Front wheel walker  Distance Ambulated (ft) 365 ft  Mobility Ambulated with assistance in hallway  Mobility Response Tolerated well  Mobility performed by Mobility specialist  $Mobility charge 1 Mobility    Pre-mobility: 108 HR, 94% SpO2 During mobility: 118-120 HR, 95% SpO2 Post-mobility: 100 HR, 97% SpO2   Pt sitting in recliner upon arrival, utilizing RA. Pt ambulated in hallway with supervision. Inconsistent drops in HR on pulse ox and dinamap---down to 40s---possibly d/t bad pleth. Pt does voice that he feels heart is beating fast however. No s/s of distress, max HR 120 bpm. Denied chest pain/tightness. Mildly winded post-activity. Pt returned to recliner with needs in reach. RN notified.    Filiberto Pinks Mobility Specialist 02/11/21, 11:48 AM

## 2021-02-11 NOTE — Progress Notes (Signed)
Occupational Therapy Treatment Patient Details Name: Christian [name] MRN: [id number] DOB: September 13, 1975 Today's Date: 02/11/2021   History of present illness Pt is a 45 y/o M admitted on 01/08/21 with c/c of SOB. Pt noted to be positive for Influenza A. Pt noted to have cellulitic area to R buttock. Imaging reveals multifocal PNA in the R lung. PMH: morbid obesity, HTN   OT comments  Mr Meneely was seen for OT treatment on this date. Upon arrival to room pt seated in recliner, agreeable to tx. SUPERVISION don/doff jacket in standing including buttons. SUPERVISION toileting at regular height toilet and hand washing standing sink side. Pt making good progress toward goals. Pt continues to benefit from skilled OT services to maximize return to PLOF and minimize risk of future falls, injury, caregiver burden, and readmission. Will continue to follow POC. Discharge recommendation remains updated to reflect pt progress reflected at recent re-eval.     Recommendations for follow up therapy are one component of a multi-disciplinary discharge planning process, led by the attending physician.  Recommendations may be updated based on patient status, additional functional criteria and insurance authorization.    Follow Up Recommendations  Outpatient OT    Assistance Recommended at Discharge Intermittent Supervision/Assistance  Equipment Recommendations  BSC/3in1;Tub/shower seat    Recommendations for Other Services      Precautions / Restrictions Precautions Precautions: Fall Restrictions Weight Bearing Restrictions: No       Mobility Bed Mobility               General bed mobility comments: received and left in bed    Transfers Overall transfer level: Modified independent Equipment used: Rolling walker (2 wheels) Transfers: Sit to/from Stand Sit to Stand: Modified independent (Device/Increase time)                 Balance Overall balance assessment: Needs  assistance Sitting-balance support: Feet supported;No upper extremity supported Sitting balance-[name]: Normal     Standing balance support: No upper extremity supported Standing balance-[name]: Good                             ADL either performed or assessed with clinical judgement   ADL Overall ADL's : Needs assistance/impaired                                       General ADL Comments: SUPERVISION don/doff jacket in standing including buttons. SUPERVISION toileting at regular height toilet and hand washing standing sink side.      Cognition Arousal/Alertness: Awake/alert Behavior During Therapy: WFL for tasks assessed/performed Overall Cognitive Status: Within Functional Limits for tasks assessed                                            Exercises Exercises: Other exercises Other Exercises Other Exercises: Pt educated re: HEP, d/c recs, ECS Other Exercises: UBD, toileting, sit<>stand, sitting/standing balnace/toelrance           Pertinent Vitals/ Pain       Pain Assessment: No/denies pain   Frequency  Min 2X/week        Progress Toward Goals  OT Goals(current goals can now be found in the care plan section)  Progress towards OT goals: Progressing toward  goals  Acute Rehab OT Goals Patient Stated Goal: to leave today OT Goal Formulation: With patient Time For Goal Achievement: 02/22/21 Potential to Achieve Goals: Good ADL Goals Pt Will Perform Lower Body Dressing: with modified independence;sit to/from stand Pt Will Perform Toileting - Clothing Manipulation and hygiene: with modified independence;sit to/from stand Additional ADL Goal #1: Pt will verbalize 3/3 strategies for for falls prevention demonstrating good safety awareness. Additional ADL Goal #2: Pt will practice dynamic standing balance during shower needed fewer than 2 verbal cues for safety.  Plan Discharge plan needs to be  updated;Frequency remains appropriate    Co-evaluation                 AM-PAC OT "6 Clicks" Daily Activity     Outcome Measure   Help from another person eating meals?: None Help from another person taking care of personal grooming?: A Little Help from another person toileting, which includes using toliet, bedpan, or urinal?: A Little Help from another person bathing (including washing, rinsing, drying)?: A Little Help from another person to put on and taking off regular upper body clothing?: None Help from another person to put on and taking off regular lower body clothing?: A Little 6 Click Score: 20    End of Session Equipment Utilized During Treatment: Rolling walker (2 wheels)  OT Visit Diagnosis: Unsteadiness on feet (R26.81);Muscle weakness (generalized) (M62.81)   Activity Tolerance Patient tolerated treatment well   Patient Left in chair;with call bell/phone within reach   Nurse Communication          Time: 2130-8657 OT Time Calculation (min): 17 min  Charges: OT General Charges $OT Visit: 1 Visit OT Treatments $Self Care/Home Management : 8-22 mins  Kathie Dike, M.S. OTR/L  02/11/21, 4:15 PM  ascom [phone]

## 2021-02-11 NOTE — Plan of Care (Signed)
  Problem: Activity: Goal: Risk for activity intolerance will decrease Outcome: Progressing   Problem: Skin Integrity: Goal: Risk for impaired skin integrity will decrease Outcome: Progressing   

## 2021-02-12 NOTE — Progress Notes (Signed)
Discharge instructions given to patient.  Right posterior thigh wound care/dressing education given to patient.  Patient demonstrated knowledge on wound care, verbalized "I know how to do it but it is challenging because I can't see it and it is difficult for me to reach".  Encouragement provided, tricks and ways on dressing change given. Patient was able to do the dressing change.  Dressing supplies provided.  DMEs in the room - BSC and walker.  Portable O2 delivered to the bording house per LSW.  Medications have been delivered in the room 02/11/21.  Transport set up at 1pm.

## 2021-02-12 NOTE — Progress Notes (Signed)
Patient was planned for d/c to boarding house today, but due to waiting for oxygen delivery, the home was not able to accept the patient this evening.  Anticipate discharge early tomorrow AM.   Patient seen and examined this AM on rounds.  No new complaints. He remains medically stable for discharge.    Exam - A&Ox3, NAD, up in recliner Normal respiratory effort, on room air Neuro grossly nonfocal exam, normal speech Skin dry intact no rashes seen

## 2021-02-12 NOTE — TOC Transition Note (Signed)
Transition of Care [facility]) - CM/SW Discharge Note   Patient Details  Name: Christian [name] MRN: [id number] Date of Birth: March 18, 1975  [facility]) CM/SW Contact:  Margarito Liner, LCSW Phone Number: 02/12/2021, 12:04 PM   Clinical Narrative:   Patient has orders to discharge today. Boarding house staff (spoke to [place]) are ready to accept him. [facility] door-to-door transport scheduled for 1:00 and will call the unit when they have arrived or are close by. [facility], dispensing to a Patient and RN have been notified. No further concerns. CSW signing off.  Final next level of care: Other (comment) (Boarding House) Barriers to Discharge: Barriers Resolved   Patient Goals and CMS Choice        Discharge Placement                Patient to be transferred to facility by: [facility] Door-to-door [facility]   Patient and family notified of of transfer: 02/12/21  Discharge Plan and Services In-house Referral: Clinical Social Work   Post Acute Care Choice: NA          DME Arranged: Hospital bed, Oxygen, 3-N-1 DME Agency: [employer] Date DME Agency Contacted: 02/12/21   Representative spoke with at DME Agency: Bjorn Loser            Social Determinants of Health (SDOH) Interventions     Readmission Risk Interventions Readmission Risk Prevention Plan 10/11/2020  Post Dischage Appt Complete  Medication Screening Complete  Transportation Screening Complete  Some recent data might be hidden

## 2021-02-12 NOTE — Final Progress Note (Signed)
[facility] door-to-door transport scheduled for 1:00 by LSW.  Called volunteer for patient pick up.  Needs addressed, discharged to [facility] house.

## 2021-02-12 NOTE — TOC Progression Note (Addendum)
Transition of Care [facility]) - Progression Note    Patient Details  Name: Christian [name] MRN: [id number] Date of Birth: September 01, 1975  Transition of Care [facility]) CM/SW Contact  Margarito Liner, LCSW Phone Number: 02/12/2021, 9:08 AM  Clinical Narrative:   Ordered bariatric 3-in-1 through [employer] per boarding house owner request. Asked that it be delivered to the room so it can be sent with him in transport.  11:35 am: Left a voicemail for [employer]. Will schedule ride when they call back.  Expected Discharge Plan: Homeless Shelter Barriers to Discharge: Continued Medical Work up  Expected Discharge Plan and Services Expected Discharge Plan: Homeless Shelter In-house Referral: Clinical Social Work   Post Acute Care Choice: NA Living arrangements for the past 2 months: Homeless Shelter Expected Discharge Date: 02/12/21                                     Social Determinants of Health (SDOH) Interventions    Readmission Risk Interventions Readmission Risk Prevention Plan 10/11/2020  Post Dischage Appt Complete  Medication Screening Complete  Transportation Screening Complete  Some recent data might be hidden

## 2021-02-23 ENCOUNTER — Other Ambulatory Visit: Payer: Self-pay

## 2021-03-03 ENCOUNTER — Other Ambulatory Visit (HOSPITAL_COMMUNITY): Payer: Self-pay

## 2021-03-04 ENCOUNTER — Other Ambulatory Visit: Payer: Self-pay

## 2021-03-04 ENCOUNTER — Other Ambulatory Visit (HOSPITAL_COMMUNITY): Payer: Self-pay

## 2021-03-08 ENCOUNTER — Other Ambulatory Visit: Payer: Self-pay

## 2021-03-09 ENCOUNTER — Other Ambulatory Visit (HOSPITAL_COMMUNITY): Payer: Self-pay

## 2021-03-10 ENCOUNTER — Other Ambulatory Visit (HOSPITAL_COMMUNITY): Payer: Self-pay

## 2021-03-10 ENCOUNTER — Other Ambulatory Visit: Payer: Self-pay

## 2021-03-11 ENCOUNTER — Other Ambulatory Visit (HOSPITAL_COMMUNITY): Payer: Self-pay

## 2021-03-12 ENCOUNTER — Encounter: Payer: Self-pay | Admitting: Family Medicine

## 2021-03-12 ENCOUNTER — Other Ambulatory Visit (HOSPITAL_COMMUNITY): Payer: Self-pay

## 2021-03-15 ENCOUNTER — Other Ambulatory Visit (HOSPITAL_COMMUNITY): Payer: Self-pay

## 2021-03-15 ENCOUNTER — Other Ambulatory Visit: Payer: Self-pay

## 2021-03-17 ENCOUNTER — Other Ambulatory Visit: Payer: Self-pay

## 2021-03-18 ENCOUNTER — Other Ambulatory Visit: Payer: Self-pay

## 2021-03-26 ENCOUNTER — Ambulatory Visit (INDEPENDENT_AMBULATORY_CARE_PROVIDER_SITE_OTHER): Payer: Self-pay | Admitting: Family Medicine

## 2021-03-26 ENCOUNTER — Encounter: Payer: Self-pay | Admitting: Nurse Practitioner

## 2021-03-26 ENCOUNTER — Other Ambulatory Visit: Payer: Self-pay

## 2021-03-26 ENCOUNTER — Encounter: Payer: Self-pay | Admitting: Family Medicine

## 2021-03-26 VITALS — BP 142/89 | HR 94 | Temp 98.3°F | Resp 18 | Ht 75.0 in | Wt >= 6400 oz

## 2021-03-26 DIAGNOSIS — E876 Hypokalemia: Secondary | ICD-10-CM

## 2021-03-26 DIAGNOSIS — Z7689 Persons encountering health services in other specified circumstances: Secondary | ICD-10-CM

## 2021-03-26 DIAGNOSIS — G4734 Idiopathic sleep related nonobstructive alveolar hypoventilation: Secondary | ICD-10-CM

## 2021-03-26 DIAGNOSIS — Z1211 Encounter for screening for malignant neoplasm of colon: Secondary | ICD-10-CM

## 2021-03-26 DIAGNOSIS — I5032 Chronic diastolic (congestive) heart failure: Secondary | ICD-10-CM

## 2021-03-26 MED ORDER — TORSEMIDE 20 MG PO TABS
20.0000 mg | ORAL_TABLET | Freq: Two times a day (BID) | ORAL | 1 refills | Status: DC
  Filled 2021-03-26 – 2021-04-13 (×2): qty 60, 30d supply, fill #0

## 2021-03-26 NOTE — Progress Notes (Signed)
New Patient Office Visit  Subjective:  Patient ID: Christian [name], male    DOB: 1975-04-29  Age: 46 y.o. MRN: [id number]  CC:  Chief Complaint  Patient presents with   Establish Care    HPI Christian [name] presents for to establish care. Patient was recently admitted to hospital with flu and he is here to follow up on recommendations. Patient reports improvements since release form the hospital.  Past Medical History:  Diagnosis Date   Allergic rhinitis    Anxiety    Depression    Elevated blood pressure reading without diagnosis of hypertension    History of chicken pox    Obesity     Past Surgical History:  Procedure Laterality Date   COLON SURGERY     TONSILLECTOMY AND ADENOIDECTOMY  03/01/1983    Family History  Problem Relation Age of Onset   Stroke Mother    Diabetes Mother        pre-diabetes   Heart disease Mother        CHF, pacer   Cancer Mother        lymphoma   Coronary artery disease Father 68       MI   Hypertension Father    Hyperlipidemia Father    Diabetes Father    Other Sister        unknown medical history   Stroke Paternal Grandmother    Diabetes Paternal Grandmother    Heart disease Paternal Grandfather    Parkinson's disease Paternal Grandfather     Social History   Socioeconomic History   Marital status: Single    Spouse name: Not on file   Number of children: Not on file   Years of education: Not on file   Highest education level: Not on file  Occupational History   Not on file  Tobacco Use   Smoking status: Never   Smokeless tobacco: Never   Tobacco comments:    Living in homeless shelter  Vaping Use   Vaping Use: Never used  Substance and Sexual Activity   Alcohol use: Yes    Alcohol/week: 2.0 standard drinks    Types: 2 Cans of beer per week    Comment: 1-2 beers 1-2 days a week   Drug use: Never   Sexual activity: Not Currently  Other Topics Concern   Not on file  Social History Narrative   Caffeine:  occasional   Lives alone, near parents.     Occupation: works at [employer] part time   Edu: some college   Diet: some water, some fruits/vegetables, 1-2x/wk red meat, fish 1x/wk   Social Determinants of Radio broadcast assistant Strain: Not on file  Food Insecurity: No Food Insecurity   Worried About Charity fundraiser in the Last Year: Never true   Arboriculturist in the Last Year: Never true  Transportation Needs: No Transportation Needs   Lack of Transportation (Medical): No   Lack of Transportation (Non-Medical): No  Physical Activity: Not on file  Stress: Not on file  Social Connections: Not on file  Intimate Partner Violence: Not on file    ROS Review of Systems  Respiratory:  Positive for shortness of breath. Negative for chest tightness and wheezing.   Cardiovascular:  Negative for leg swelling.  Gastrointestinal: Negative.   All other systems reviewed and are negative.  Objective:   Today's Vitals: BP (!) 142/89    Pulse 94    Temp 98.3  F (36.8 C) (Oral)    Resp 18    Ht 6' 3"  (1.905 m)    Wt (!) 413 lb 6.4 oz (187.5 kg)    SpO2 94%    BMI 51.67 kg/m   Physical Exam Vitals and nursing note reviewed.  Constitutional:      General: He is not in acute distress.    Appearance: He is obese.  Cardiovascular:     Rate and Rhythm: Normal rate and regular rhythm.  Pulmonary:     Effort: Pulmonary effort is normal.     Breath sounds: Normal breath sounds.  Abdominal:     Palpations: Abdomen is soft.     Tenderness: There is no abdominal tenderness.  Musculoskeletal:     Right lower leg: Edema present.     Left lower leg: Edema present.  Neurological:     General: No focal deficit present.     Mental Status: He is alert and oriented to person, place, and time.    Assessment & Plan:   1. Nocturnal hypoxia Referral for sleep study.  - Split night study; Future  2. Chronic diastolic CHF (congestive heart failure) ([place]) Referral to cardiac rehab  -  Amb Referral to Cardiac Rehabilitation - CBC with Differential  3. Hypokalemia Monitoring labs ordered - CMP14+EGFR  4. Screening for colon cancer Referral to GI for further eval/mgt - Ambulatory referral to Gastroenterology  5. Encounter to establish care   Outpatient Encounter Medications as of 03/26/2021  Medication Sig   Calcium Alginate (ALGICELL CALCIUM DRESSING 4"X4) MISC Apply 1 application topically once daily at 12 noon.   docusate sodium (COLACE) 100 MG capsule Take 1 capsule (100 mg total) by mouth 2 (two) times daily.   fluticasone (FLONASE) 50 MCG/ACT nasal spray Spray 2 sprays into both nostrils once daily.   iron polysaccharides (NIFEREX) 150 MG capsule Take 1 capsule (150 mg total) by mouth once daily.   potassium chloride SA (KLOR-CON M) 20 MEQ tablet Take 2 tablets (40 mEq total) by mouth 3 (three) times daily.   sertraline (ZOLOFT) 50 MG tablet Take 1 tablet (50 mg total) by mouth once daily.   vitamin B-12 (CYANOCOBALAMIN) 100 MCG tablet Take 1 tablet (100 mcg total) by mouth once daily.   [DISCONTINUED] torsemide (DEMADEX) 20 MG tablet Take 1 tablet (20 mg total) by mouth 2 (two) times daily.   torsemide (DEMADEX) 20 MG tablet Take 1 tablet (20 mg total) by mouth 2 (two) times daily.   [DISCONTINUED] guaiFENesin (ROBITUSSIN) 100 MG/5ML liquid Take 15 mLs by mouth once every 6 (six) hours as needed for cough or to loosen phlegm.   Facility-Administered Encounter Medications as of 03/26/2021  Medication   cholecalciferol (VITAMIN D3) tablet 1,000 Units    Follow-up: No follow-ups on file.   Becky Sax, MD

## 2021-03-26 NOTE — Progress Notes (Signed)
Patient is here to establish care with new provider  Patient is requesting a sleep study   Colonoscopy referral requested

## 2021-03-27 LAB — CBC WITH DIFFERENTIAL/PLATELET
Basophils Absolute: 0.1 10*3/uL (ref 0.0–0.2)
Basos: 1 %
EOS (ABSOLUTE): 0.4 10*3/uL (ref 0.0–0.4)
Eos: 4 %
Hematocrit: 44.1 % (ref 37.5–51.0)
Hemoglobin: 14.4 g/dL (ref 13.0–17.7)
Immature Grans (Abs): 0 10*3/uL (ref 0.0–0.1)
Immature Granulocytes: 0 %
Lymphocytes Absolute: 1.7 10*3/uL (ref 0.7–3.1)
Lymphs: 19 %
MCH: 25.5 pg — ABNORMAL LOW (ref 26.6–33.0)
MCHC: 32.7 g/dL (ref 31.5–35.7)
MCV: 78 fL — ABNORMAL LOW (ref 79–97)
Monocytes Absolute: 0.8 10*3/uL (ref 0.1–0.9)
Monocytes: 9 %
Neutrophils Absolute: 6.1 10*3/uL (ref 1.4–7.0)
Neutrophils: 67 %
Platelets: 354 10*3/uL (ref 150–450)
RBC: 5.65 x10E6/uL (ref 4.14–5.80)
RDW: 14.7 % (ref 11.6–15.4)
WBC: 9.1 10*3/uL (ref 3.4–10.8)

## 2021-03-27 LAB — CMP14+EGFR
ALT: 8 IU/L (ref 0–44)
AST: 13 IU/L (ref 0–40)
Albumin/Globulin Ratio: 1.3 (ref 1.2–2.2)
Albumin: 3.9 g/dL — ABNORMAL LOW (ref 4.0–5.0)
Alkaline Phosphatase: 108 IU/L (ref 44–121)
BUN/Creatinine Ratio: 14 (ref 9–20)
BUN: 14 mg/dL (ref 6–24)
Bilirubin Total: 0.5 mg/dL (ref 0.0–1.2)
CO2: 29 mmol/L (ref 20–29)
Calcium: 9.2 mg/dL (ref 8.7–10.2)
Chloride: 99 mmol/L (ref 96–106)
Creatinine, Ser: 0.99 mg/dL (ref 0.76–1.27)
Globulin, Total: 3.1 g/dL (ref 1.5–4.5)
Glucose: 103 mg/dL — ABNORMAL HIGH (ref 70–99)
Potassium: 3.8 mmol/L (ref 3.5–5.2)
Sodium: 143 mmol/L (ref 134–144)
Total Protein: 7 g/dL (ref 6.0–8.5)
eGFR: 95 mL/min/{1.73_m2} (ref >59)

## 2021-03-29 ENCOUNTER — Encounter: Payer: Self-pay | Admitting: Family Medicine

## 2021-03-30 ENCOUNTER — Other Ambulatory Visit: Payer: Self-pay

## 2021-03-30 ENCOUNTER — Telehealth (HOSPITAL_COMMUNITY): Payer: Self-pay

## 2021-03-30 NOTE — Telephone Encounter (Signed)
Contacted pt to see if he was interested in the cardiac rehab program. Pt stated that he was interested. I asked did he have insurance pt stated that he doesn't have insurance but he has been approved for financial assistance through [facility] that's extended through April of 2023. I was unable to find any paperwork or documentation on that. I advised pt that I wasn't sure if that assistance program covered cardiac rehab or not. I advised pt of the cardiac rehab maintenance program we have which is $51 a month and/or the virtual cardiac rehab program which is free of charge. Pt stated that if the assistance program didn't cover the cardiac rehab program that he would be interested in the virtual cardiac rehab. I contacted pt primary care office [facility]) to see if they could provide me any information on the pt's financial assistance program, had to leave a message for someone to call back.

## 2021-04-02 ENCOUNTER — Telehealth: Payer: Self-pay | Admitting: Pharmacist

## 2021-04-02 NOTE — Telephone Encounter (Signed)
Patient failed to provide requested 2023 financial documentation. No additional medication assistance will be provided by [facility] without the required proof of income documentation. Patient notified by letter. ? ?Deborah Barnes ?Medication Management ?

## 2021-04-05 ENCOUNTER — Telehealth: Payer: Self-pay | Admitting: Pharmacy Technician

## 2021-04-05 NOTE — Telephone Encounter (Signed)
Patient no longer an [facility] resident.  Is living in [place] and obtaining medications from [facility].  Sherilyn Dacosta Care Manager [facility]

## 2021-04-07 NOTE — Telephone Encounter (Signed)
Called and spoke with pt in regards to his financial assistance. Pt stated that he did recv'ed a message on his mychart that his financial assistance only covered his hospital visits. He also stated he is in the process of trying to get insurance and that he will contact CR when he does.

## 2021-04-13 ENCOUNTER — Encounter: Payer: Self-pay | Admitting: Nurse Practitioner

## 2021-04-13 ENCOUNTER — Other Ambulatory Visit: Payer: Self-pay

## 2021-04-13 ENCOUNTER — Ambulatory Visit (INDEPENDENT_AMBULATORY_CARE_PROVIDER_SITE_OTHER): Payer: Self-pay | Admitting: Nurse Practitioner

## 2021-04-13 VITALS — BP 118/82 | HR 112 | Ht 72.25 in | Wt >= 6400 oz

## 2021-04-13 DIAGNOSIS — K631 Perforation of intestine (nontraumatic): Secondary | ICD-10-CM

## 2021-04-13 DIAGNOSIS — Z1211 Encounter for screening for malignant neoplasm of colon: Secondary | ICD-10-CM

## 2021-04-13 NOTE — Progress Notes (Signed)
ASSESSMENT AND PLAN    # 46 yo male for colon cancer screening. Average risk for screening EXCEPT that he did have a cecal perforation in August 2022. CT scan a few days earlier suggested ileus or possible colonic obstruction, s/p exploratory laparotomy with primary repair of the perforation. Doing well now, abdominal incision has healed.  --Patient needs a colonoscopy. This will need to be done at the hospital given his BMI and also need for supplemental 02 at night.  The risks and benefits of colonoscopy with possible polypectomy / biopsies were discussed and the patient agrees to proceed.  --Patient has no family. His thinks someone from his church can be care partner for colonoscopy. Will need to find a date / time for colonoscopy to be done at the hospital with Dr Silverio Decamp  # Nocturnal hypoxia. PCP arranging for sleep study   HISTORY OF PRESENT ILLNESS     Chief Complaint :  colon cancer screening   Christian [name] is a 46 y.o. male with a past medical history significant for cecal perforation s/p exploratory laparotomy with primary repair of perforation in August 2022, HTN, morbid obesity, chronic diastolic heart failure, nocturnal hypoxia  See PMH below for any additional history.   Referred by Tawnya Crook, MD for screening colonoscopy. He was hosptalized in August with sepsis , AKI, cellultits of abdominal wall, pressure ulcers of buttock and back, and respiratory failure.  He was homeless, had been living in his car.   On admission CTAP >> small bowel  mildly dilated distally. The colon is dilated, measuring up to 8.8 cm near the hepatic flexure and 7.3 cm in the descending colon.    It was difficult to get him off oxygen so on 8/18 a CXR was obtained and it showed free air. Follow up CT scan showed large amount of free air suspicious for bowel perforation.  On 8/19 he had exploratory lap with primary repair of a 1 cm cecal perforation.  He recalls having nausea / vomiting  and periumbilical pain a few days prior to admission but says he doesn't know what led to the bowel perforation. He says his hospital was prolonged because he couldn't get into Rehab due to lack of funding and he had no family / friends in area to help. He hasn't worked since hospital discharge.   Nazar isn't currently having any GI issues.  No [place] of colon cancer  Data Reviewed:  CBC Latest Ref Rng & Units 03/26/2021 01/27/2021 01/20/2021  WBC 3.4 - 10.8 x10E3/uL 9.1 8.0 8.4  Hemoglobin 13.0 - 17.7 g/dL 14.4 12.2(L) 12.2(L)  Hematocrit 37.5 - 51.0 % 44.1 39.2 39.7  Platelets 150 - 450 x10E3/uL 354 374 422(H)    Lab Results  Component Value Date   LIPASE 23 10/09/2020   CMP Latest Ref Rng & Units 03/26/2021 02/08/2021 02/05/2021  Glucose 70 - 99 mg/dL 103(H) - 107(H)  BUN 6 - 24 mg/dL 14 - 15  Creatinine 0.76 - 1.27 mg/dL 0.99 - 0.92  Sodium 134 - 144 mmol/L 143 - 137  Potassium 3.5 - 5.2 mmol/L 3.8 3.8 3.8  Chloride 96 - 106 mmol/L 99 - 97(L)  CO2 20 - 29 mmol/L 29 - 32  Calcium 8.7 - 10.2 mg/dL 9.2 - 8.6(L)  Total Protein 6.0 - 8.5 g/dL 7.0 - -  Total Bilirubin 0.0 - 1.2 mg/dL 0.5 - -  Alkaline Phos 44 - 121 IU/L 108 - -  AST 0 - 40 IU/L  13 - -  ALT 0 - 44 IU/L 8 - -   10/09/20 CTAP w/ contrast IMPRESSION: 1. Dilated distal small bowel and colon without transition point, possibly due to ileus, although early or partial small bowel obstruction could appear similar.  2. Enlarged pelvic and inguinal lymph nodes, which are nonspecific and most likely reactive, but concerning for more distal infection, possibly in the lower extremities. Additional prominent retroperitoneal and mesenteric lymph nodes are also favored to be reactive.   10/10/20 two view abdomen 1. Distended gas-filled loops of large bowel throughout the abdomen and pelvis. Uncertain involvement of the small bowel. Findings suggest either ileus or distal obstruction. 2. Lungs are clear.  10/15/20 CTAP  w.contrast 1. Large pneumoperitoneum suspicious for bowel perforation. Clinical correlation and surgical consult is advised. 2. Thickened appearance of the small bowel loops most suspicious for enteritis. No bowel obstruction. Normal appendix. 3. Small ascites. 4. Bibasilar subpleural linear and streaky atelectasis. Developing infiltrate or aspiration is not excluded.   Echo Sept 2022  LVEF 55-60%  PREVIOUS GI EVALUATIONS:   none  Past Medical History:  Diagnosis Date   Allergic rhinitis    Anxiety    CHF (congestive heart failure) (HCC)    Depression    Elevated blood pressure reading without diagnosis of hypertension    History of chicken pox    Obesity    Sepsis ([place])    Sleep apnea    O2 at night     Past Surgical History:  Procedure Laterality Date   COLON SURGERY     perforated bowel   TONSILLECTOMY AND ADENOIDECTOMY  03/01/1983   Family History  Problem Relation Age of Onset   Stroke Mother    Diabetes Mother        pre-diabetes   Heart disease Mother        CHF, pacer   Cancer Mother        lymphoma   Stomach cancer Mother    Clotting disorder Mother    Coronary artery disease Father 90       MI   Hypertension Father    Hyperlipidemia Father    Diabetes Father    Heart attack Father    Migraines Sister    Stroke Paternal Grandmother    Diabetes Paternal Grandmother    Heart disease Paternal Grandfather    Parkinson's disease Paternal Grandfather    Heart attack Paternal Grandfather    Social History   Tobacco Use   Smoking status: Never   Smokeless tobacco: Never   Tobacco comments:    Living in homeless shelter  Vaping Use   Vaping Use: Never used  Substance Use Topics   Alcohol use: Yes    Alcohol/week: 2.0 standard drinks    Types: 2 Cans of beer per week    Comment: 1-2 beers 1-2 days a week   Drug use: Never   Current Outpatient Medications  Medication Sig Dispense Refill   docusate sodium (COLACE) 100 MG capsule Take 1  capsule (100 mg total) by mouth 2 (two) times daily. (Patient taking differently: Take 100 mg by mouth as needed.) 10 capsule 0   fluticasone (FLONASE) 50 MCG/ACT nasal spray Spray 2 sprays into both nostrils once daily. 16 g 2   OXYGEN Inhale 2 L/min into the lungs at bedtime.     sertraline (ZOLOFT) 50 MG tablet Take 1 tablet (50 mg total) by mouth once daily. 30 tablet 11   torsemide (DEMADEX) 20 MG tablet Take 1  tablet (20 mg total) by mouth 2 (two) times daily. 60 tablet 1   vitamin B-12 (CYANOCOBALAMIN) 100 MCG tablet Take 1 tablet (100 mcg total) by mouth once daily. 30 tablet 0   Calcium Alginate (ALGICELL CALCIUM DRESSING 4"X4) MISC Apply 1 application topically once daily at 12 noon. (Patient not taking: Reported on 04/13/2021) 10 each 0   Current Facility-Administered Medications  Medication Dose Route Frequency Provider Last Rate Last Admin   cholecalciferol (VITAMIN D3) tablet 1,000 Units  1,000 Units Oral Daily Regalado, Belkys A, MD       Allergies  Allergen Reactions   Shellfish Allergy Nausea Only     Review of Systems:  Over the preceding 6 months; Positive for anxiety, cough, depression, fever, swelling of feet.  All other systems reviewed and negative except where noted in HPI.    PHYSICAL EXAM :    Wt Readings from Last 3 Encounters:  04/13/21 (!) 405 lb 2 oz (183.8 kg)  03/26/21 (!) 413 lb 6.4 oz (187.5 kg)  02/04/21 (!) 439 lb (199.1 kg)    BP 118/82 (BP Location: Left Arm, Patient Position: Sitting, Cuff Size: Large)    Pulse (!) 112    Ht 6' 0.25" (1.835 m) Comment: height measured without shoes   Wt (!) 405 lb 2 oz (183.8 kg)    BMI 54.57 kg/m  Constitutional:  Pleasant obese male in no acute distress. Psychiatric: Normal mood and affect. Behavior is normal. EENT: Pupils normal.  Conjunctivae are normal. No scleral icterus. Neck supple.  Cardiovascular: Normal rate, regular rhythm. No edema Pulmonary/chest: Effort normal and breath sounds normal. No  wheezing, rales or rhonchi. Abdominal: Soft, nondistended, nontender. Bowel sounds active throughout. There are no masses palpable. No hepatomegaly. Neurological: Alert and oriented to person place and time. Skin: Skin is warm and dry. No rashes noted.  Tye Savoy, NP  04/13/2021, 2:48 PM  Cc:  Referring Provider Dorna Mai, MD

## 2021-04-13 NOTE — Patient Instructions (Signed)
RECOMMENDATION(S):  It is recommended that you have a screening colonoscopy at [facility]. We currently do not have any openings to book, so we will call you once we have some days and times open.    BMI:  If you are age 47 or older, your body mass index should be between 23-30. Your Body mass index is 54.57 kg/m. If this is out of the aforementioned range listed, please consider follow up with your Primary Care Provider.  If you are age 74 or younger, your body mass index should be between 19-25. Your Body mass index is 54.57 kg/m. If this is out of the aformentioned range listed, please consider follow up with your Primary Care Provider.   MY CHART:  The [place] [facility] providers would like to encourage you to use [facility] to communicate with providers for non-urgent requests or questions.  Due to long hold times on the telephone, sending your provider a message by [facility] may be a faster and more efficient way to get a response.  Please allow 48 business hours for a response.  Please remember that this is for non-urgent requests.   Thank you for trusting me with your gastrointestinal care!    Tye Savoy, NP

## 2021-04-15 ENCOUNTER — Other Ambulatory Visit: Payer: Self-pay

## 2021-04-26 ENCOUNTER — Telehealth: Payer: Self-pay

## 2021-04-26 NOTE — Telephone Encounter (Signed)
-----   Message from Meredith Pel, NP sent at 04/22/2021  3:28 PM EST -----  Christian [name], I tried to call patient today, got VM. Left him a message that we would try to contact him again later. He was waiting to hear back from [place] about a colonoscopy.Please tell him that I spoke with Dr. Lavon Paganini.  Presently he is not an appropriate candidate for  screening colonoscopy based on multiple comorbidities. Overall risk for potential complications outweigh any benefits  He can return to GI as needed .   Thanks

## 2021-04-26 NOTE — Telephone Encounter (Signed)
Called the patient. No answer. Left the information on his voicemail with [facility] phone number if he has questions.

## 2021-05-09 ENCOUNTER — Ambulatory Visit (HOSPITAL_BASED_OUTPATIENT_CLINIC_OR_DEPARTMENT_OTHER): Payer: Medicaid Other | Admitting: Internal Medicine

## 2021-05-10 ENCOUNTER — Other Ambulatory Visit: Payer: Self-pay

## 2021-05-10 ENCOUNTER — Ambulatory Visit (INDEPENDENT_AMBULATORY_CARE_PROVIDER_SITE_OTHER): Payer: Self-pay | Admitting: Family Medicine

## 2021-05-10 ENCOUNTER — Encounter: Payer: Self-pay | Admitting: Family Medicine

## 2021-05-10 VITALS — BP 140/82 | HR 84 | Temp 98.0°F | Resp 18 | Ht 73.0 in | Wt >= 6400 oz

## 2021-05-10 DIAGNOSIS — I5032 Chronic diastolic (congestive) heart failure: Secondary | ICD-10-CM

## 2021-05-10 DIAGNOSIS — F32A Depression, unspecified: Secondary | ICD-10-CM

## 2021-05-10 DIAGNOSIS — G4734 Idiopathic sleep related nonobstructive alveolar hypoventilation: Secondary | ICD-10-CM

## 2021-05-10 DIAGNOSIS — Z6841 Body Mass Index (BMI) 40.0 and over, adult: Secondary | ICD-10-CM

## 2021-05-10 MED ORDER — TORSEMIDE 20 MG PO TABS
20.0000 mg | ORAL_TABLET | Freq: Two times a day (BID) | ORAL | 1 refills | Status: DC
  Filled 2021-05-10: qty 60, 30d supply, fill #0
  Filled 2021-06-09: qty 60, 30d supply, fill #1
  Filled 2021-07-12: qty 60, 30d supply, fill #2

## 2021-05-10 MED ORDER — SERTRALINE HCL 100 MG PO TABS
100.0000 mg | ORAL_TABLET | Freq: Every day | ORAL | 0 refills | Status: DC
  Filled 2021-05-10: qty 30, 30d supply, fill #0
  Filled 2021-06-09: qty 30, 30d supply, fill #1
  Filled 2021-07-12: qty 30, 30d supply, fill #2

## 2021-05-10 NOTE — Progress Notes (Signed)
Patient is here for his 3 month f/up. Patient is still waiting to have a sleep study that was cancel by company due to staffing needs.  Patient is concern as to if he should continue the medication he is on. Patient is waiting to see if he can go back to work. ?

## 2021-05-10 NOTE — Progress Notes (Unsigned)
Established Patient Office Visit  Subjective:  Patient ID: Christian [name], Christian [name]    DOB: Jun 14, 1975  Age: 46 y.o. MRN: [id number]  CC:  Chief Complaint  Patient presents with   Follow-up    3 months    HPI Christian [name] presents for follow up of chronic med issues.   Past Medical History:  Diagnosis Date   Allergic rhinitis    Anxiety    CHF (congestive heart failure) (HCC)    Depression    Elevated blood pressure reading without diagnosis of hypertension    History of chicken pox    Obesity    Sepsis (HCC)    Sleep apnea    O2 at night    Past Surgical History:  Procedure Laterality Date   COLON SURGERY     perforated bowel   TONSILLECTOMY AND ADENOIDECTOMY  03/01/1983    Family History  Problem Relation Age of Onset   Stroke Mother    Diabetes Mother        pre-diabetes   Heart disease Mother        CHF, pacer   Cancer Mother        lymphoma   Stomach cancer Mother    Clotting disorder Mother    Coronary artery disease Father 9       MI   Hypertension Father    Hyperlipidemia Father    Diabetes Father    Heart attack Father    Migraines Sister    Stroke Paternal Grandmother    Diabetes Paternal Grandmother    Heart disease Paternal Grandfather    Parkinson's disease Paternal Grandfather    Heart attack Paternal Grandfather     Social History   Socioeconomic History   Marital status: Single    Spouse name: Not on file   Number of children: 0   Years of education: Not on file   Highest education level: Not on file  Occupational History   Not on file  Tobacco Use   Smoking status: Never   Smokeless tobacco: Never   Tobacco comments:    Living in homeless shelter  Vaping Use   Vaping Use: Never used  Substance and Sexual Activity   Alcohol use: Yes    Alcohol/week: 2.0 standard drinks    Types: 2 Cans of beer per week    Comment: 1-2 beers 1-2 days a week   Drug use: Never   Sexual activity: Not Currently  Other Topics  Concern   Not on file  Social History Narrative   Caffeine: occasional   Lives alone, near parents.     Occupation: works at [employer] part time   Edu: some college   Diet: some water, some fruits/vegetables, 1-2x/wk red meat, fish 1x/wk   Social Determinants of Corporate investment banker Strain: Not on file  Food Insecurity: No Food Insecurity   Worried About Programme researcher, broadcasting/film/video in the Last Year: Never true   Barista in the Last Year: Never true  Transportation Needs: No Transportation Needs   Lack of Transportation (Medical): No   Lack of Transportation (Non-Medical): No  Physical Activity: Not on file  Stress: Not on file  Social Connections: Not on file  Intimate Partner Violence: Not on file    ROS Review of Systems  Objective:   Today's Vitals: BP 140/82    Pulse 84    Temp 98 F (36.7 C) (Oral)    Resp 18  Ht 6\' 1"  (1.854 m)    Wt (!) 406 lb 6.4 oz (184.3 kg)    SpO2 91%    BMI 53.62 kg/m   Physical Exam  Assessment & Plan:   Problem List Items Addressed This Visit       Cardiovascular and Mediastinum   Chronic diastolic CHF (congestive heart failure) (HCC)   Relevant Medications   torsemide (DEMADEX) 20 MG tablet     Respiratory   Nocturnal hypoxia - Primary     Other   Depression   Relevant Medications   sertraline (ZOLOFT) 100 MG tablet   Other Visit Diagnoses     Class 3 severe obesity due to excess calories with serious comorbidity and body mass index (BMI) of 50.0 to 59.9 in adult [facility])           Outpatient Encounter Medications as of 05/10/2021  Medication Sig   fluticasone (FLONASE) 50 MCG/ACT nasal spray Spray 2 sprays into both nostrils once daily.   OXYGEN Inhale 2 L/min into the lungs at bedtime.   sertraline (ZOLOFT) 100 MG tablet Take 1 tablet (100 mg total) by mouth daily.   sertraline (ZOLOFT) 50 MG tablet Take 1 tablet (50 mg total) by mouth once daily.   vitamin B-12 (CYANOCOBALAMIN) 100 MCG tablet Take 1 tablet  (100 mcg total) by mouth once daily.   [DISCONTINUED] torsemide (DEMADEX) 20 MG tablet Take 1 tablet (20 mg total) by mouth 2 (two) times daily.   torsemide (DEMADEX) 20 MG tablet Take 1 tablet (20 mg total) by mouth 2 (two) times daily.   [DISCONTINUED] Calcium Alginate (ALGICELL CALCIUM DRESSING 4"X4) MISC Apply 1 application topically once daily at 12 noon.   [DISCONTINUED] docusate sodium (COLACE) 100 MG capsule Take 1 capsule (100 mg total) by mouth 2 (two) times daily. (Patient taking differently: Take 100 mg by mouth as needed.)   Facility-Administered Encounter Medications as of 05/10/2021  Medication   cholecalciferol (VITAMIN D3) tablet 1,000 Units    Follow-up: No follow-ups on file.   05/12/2021, MD

## 2021-05-11 ENCOUNTER — Encounter: Payer: Self-pay | Admitting: Family Medicine

## 2021-05-12 ENCOUNTER — Other Ambulatory Visit: Payer: Self-pay

## 2021-06-09 ENCOUNTER — Other Ambulatory Visit: Payer: Self-pay

## 2021-06-11 ENCOUNTER — Other Ambulatory Visit: Payer: Self-pay

## 2021-06-14 ENCOUNTER — Ambulatory Visit (HOSPITAL_BASED_OUTPATIENT_CLINIC_OR_DEPARTMENT_OTHER): Payer: Self-pay | Attending: Family Medicine | Admitting: Internal Medicine

## 2021-06-14 VITALS — Ht 73.0 in | Wt >= 6400 oz

## 2021-06-14 DIAGNOSIS — G4734 Idiopathic sleep related nonobstructive alveolar hypoventilation: Secondary | ICD-10-CM | POA: Insufficient documentation

## 2021-06-14 DIAGNOSIS — G4733 Obstructive sleep apnea (adult) (pediatric): Secondary | ICD-10-CM

## 2021-06-20 DIAGNOSIS — G4734 Idiopathic sleep related nonobstructive alveolar hypoventilation: Secondary | ICD-10-CM

## 2021-06-20 NOTE — Procedures (Signed)
? ?Patient Name: Christian [name], Christian [name] ?Study Date: 06/14/2021 ?Gender: Male ?D.O.B: 04-05-1975 ?Age (years): 54 ?Referring Provider: [name] ?Height (inches): 73 ?Interpreting Physician: Jetty Duhamel MD, ABSM ?Weight (lbs): 410 ?RPSGT: Shelah Lewandowsky ?BMI: 54 ?MRN: [id number] ?Neck Size: 20.50 ? ?CLINICAL INFORMATION ?Sleep Study Type: NPSG ?Indication for sleep study: Congestive Heart Failure, Excessive Daytime Sleepiness, Hypertension, Obesity, Witnessed Apneas ?Epworth Sleepiness Score: 8 ? ?SLEEP STUDY TECHNIQUE ?As per the AASM Manual for the Scoring of Sleep and Associated Events v2.3 (April 2016) with a hypopnea requiring 4% desaturations. ? ?The channels recorded and monitored were frontal, central and occipital EEG, electrooculogram (EOG), submentalis EMG (chin), nasal and oral airflow, thoracic and abdominal wall motion, anterior tibialis EMG, snore microphone, electrocardiogram, and pulse oximetry. ? ?MEDICATIONS ?Medications self-administered by patient taken the night of the study : none reported ? ?SLEEP ARCHITECTURE ?The study was initiated at 10:53:42 PM and ended at 5:17:53 AM. ? ?Sleep onset time was 47.0 minutes and the sleep efficiency was 64.4%%. The total sleep time was 247.5 minutes. ? ?Stage REM latency was 231.0 minutes. ? ?The patient spent 6.3%% of the night in stage N1 sleep, 73.9%% in stage N2 sleep, 0.0%% in stage N3 and 19.8% in REM. ? ?Alpha intrusion was absent. ? ?Supine sleep was 100.00%. ? ?RESPIRATORY PARAMETERS ?The overall apnea/hypopnea index (AHI) was 25.2 per hour. There were 9 total apneas, including 1 obstructive, 8 central and 0 mixed apneas. There were 95 hypopneas and 16 RERAs. ? ?The AHI during Stage REM sleep was 82.0 per hour. ? ?AHI while supine was 25.2 per hour. ? ?The mean oxygen saturation was 89.8%. The minimum SpO2 during sleep was 71.0%. ? ?soft snoring was noted during this study. ? ?CARDIAC DATA ?The 2 lead EKG demonstrated sinus rhythm. The mean heart rate  was 70.4 beats per minute. Other EKG findings include: PVCs. ? ?LEG MOVEMENT DATA ?The total PLMS were 0 with a resulting PLMS index of 0.0. Associated arousal with leg movement index was 0.0 . ? ?IMPRESSIONS ?- Moderate obstructive sleep apnea occurred during this study (AHI = 25.2/h). ?- Insufficient early sleep and events to meet protocol requirements for split CPAP titration. ?- No significant central sleep apnea occurred during this study (CAI = 1.9/h). ?- Moderate oxygen desaturation was noted during this study (Min O2 = 71.0%). Mean 89.8%. Time with O2 saturation 88% or less was 60.4 minutes. ?- The patient snored with soft snoring volume. ?- EKG findings include PVCs. ?- Clinically significant periodic limb movements did not occur during sleep. No significant associated arousals. ? ?DIAGNOSIS ?- Obstructive Sleep Apnea (G47.33) ?- Nocturnal Hypoxemia (G47.36) ? ?RECOMMENDATIONS ?- CPAP titration sleep study would document if supplemental O2 is appropriate. [facility]op[facility], Sleep Medicine consultation or other options would be based on clinical udgment. ?- Be careful with alcohol, sedatives and other CNS depressants that may worsen sleep apnea and disrupt normal sleep architecture. ?- Sleep hygiene should be reviewed to assess factors that may improve sleep quality. ?- Weight management and regular exercise should be initiated or continued if appropriate. ? ?[Electronically signed] 06/20/2021 12:06 PM ? ?Jetty Duhamel MD, ABSM ?Diplomate, Biomedical engineer of Sleep Medicine ?NPI: [id number] ? ? ?  ? ? ? ? ? ? ? ? ? ? ? ? ? ? ? ? ? ? ? ? ? ?Suzana Sohail ?Diplomate, Biomedical engineer of Sleep Medicine ? ?ELECTRONICALLY SIGNED ON:  06/20/2021, 11:58 AM ?[facility] ?PH: [phone]   FX: [phone] ?ACCREDITED BY THE AMERICAN ACADEMY  OF SLEEP MEDICINE ?

## 2021-07-12 ENCOUNTER — Other Ambulatory Visit: Payer: Self-pay

## 2021-07-13 ENCOUNTER — Ambulatory Visit (INDEPENDENT_AMBULATORY_CARE_PROVIDER_SITE_OTHER): Payer: Self-pay | Admitting: Family Medicine

## 2021-07-13 ENCOUNTER — Other Ambulatory Visit: Payer: Self-pay

## 2021-07-13 ENCOUNTER — Encounter: Payer: Self-pay | Admitting: Family Medicine

## 2021-07-13 VITALS — BP 122/87 | HR 80 | Temp 98.1°F | Resp 18 | Wt 394.0 lb

## 2021-07-13 DIAGNOSIS — G4733 Obstructive sleep apnea (adult) (pediatric): Secondary | ICD-10-CM

## 2021-07-13 DIAGNOSIS — F32A Depression, unspecified: Secondary | ICD-10-CM

## 2021-07-13 DIAGNOSIS — I10 Essential (primary) hypertension: Secondary | ICD-10-CM

## 2021-07-13 DIAGNOSIS — Z6841 Body Mass Index (BMI) 40.0 and over, adult: Secondary | ICD-10-CM

## 2021-07-13 MED ORDER — SERTRALINE HCL 100 MG PO TABS
100.0000 mg | ORAL_TABLET | Freq: Every day | ORAL | 1 refills | Status: DC
  Filled 2021-08-13: qty 90, 90d supply, fill #0

## 2021-07-13 MED ORDER — TORSEMIDE 20 MG PO TABS
20.0000 mg | ORAL_TABLET | Freq: Two times a day (BID) | ORAL | 1 refills | Status: DC
  Filled 2021-08-13: qty 60, 30d supply, fill #0
  Filled 2021-09-18: qty 60, 30d supply, fill #1

## 2021-07-13 NOTE — Progress Notes (Signed)
Patient is here for f/u HTN. ?Patient is here for sleep study results from April. ?Patient has no other concerns ?

## 2021-07-13 NOTE — Progress Notes (Signed)
? ?Established Patient Office Visit ? ?Subjective   ? ?Patient ID: Christian [name], male    DOB: 30-Mar-1975  Age: 46 y.o. MRN: [id number] ? ?CC:  ?Chief Complaint  ?Patient presents with  ? Follow-up  ? Hypertension  ? ? ?HPI ?Christian [name] presents for follow up of hypertension and depression. He reports that although he is improved, he would like to increase his medication for his mood.  ? ? ?Outpatient Encounter Medications as of 07/13/2021  ?Medication Sig  ? fluticasone (FLONASE) 50 MCG/ACT nasal spray Spray 2 sprays into both nostrils once daily.  ? OXYGEN Inhale 2 L/min into the lungs at bedtime.  ? sertraline (ZOLOFT) 100 MG tablet Take 1 tablet (100 mg total) by mouth daily.  ? sertraline (ZOLOFT) 100 MG tablet Take 1 tablet (100 mg total) by mouth daily.  ? torsemide (DEMADEX) 20 MG tablet Take 1 tablet (20 mg total) by mouth 2 (two) times daily.  ? vitamin B-12 (CYANOCOBALAMIN) 100 MCG tablet Take 1 tablet (100 mcg total) by mouth once daily.  ? [DISCONTINUED] sertraline (ZOLOFT) 50 MG tablet Take 1 tablet (50 mg total) by mouth once daily.  ? [DISCONTINUED] torsemide (DEMADEX) 20 MG tablet Take 1 tablet (20 mg total) by mouth 2 (two) times daily.  ? ?Facility-Administered Encounter Medications as of 07/13/2021  ?Medication  ? cholecalciferol (VITAMIN D3) tablet 1,000 Units  ? ? ?Past Medical History:  ?Diagnosis Date  ? Allergic rhinitis   ? Anxiety   ? CHF (congestive heart failure) (HCC)   ? Depression   ? Elevated blood pressure reading without diagnosis of hypertension   ? History of chicken pox   ? Obesity   ? Sepsis (HCC)   ? Sleep apnea   ? O2 at night  ? ? ?Past Surgical History:  ?Procedure Laterality Date  ? COLON SURGERY    ? perforated bowel  ? TONSILLECTOMY AND ADENOIDECTOMY  03/01/1983  ? ? ?Family History  ?Problem Relation Age of Onset  ? Stroke Mother   ? Diabetes Mother   ?     pre-diabetes  ? Heart disease Mother   ?     CHF, pacer  ? Cancer Mother   ?     lymphoma  ? Stomach cancer  Mother   ? Clotting disorder Mother   ? Coronary artery disease Father 48  ?     MI  ? Hypertension Father   ? Hyperlipidemia Father   ? Diabetes Father   ? Heart attack Father   ? Migraines Sister   ? Stroke Paternal Grandmother   ? Diabetes Paternal Grandmother   ? Heart disease Paternal Grandfather   ? Parkinson's disease Paternal Grandfather   ? Heart attack Paternal Grandfather   ? ? ?Social History  ? ?Socioeconomic History  ? Marital status: Single  ?  Spouse name: Not on file  ? Number of children: 0  ? Years of education: Not on file  ? Highest education level: Not on file  ?Occupational History  ? Not on file  ?Tobacco Use  ? Smoking status: Never  ? Smokeless tobacco: Never  ? Tobacco comments:  ?  Living in homeless shelter  ?Vaping Use  ? Vaping Use: Never used  ?Substance and Sexual Activity  ? Alcohol use: Yes  ?  Alcohol/week: 2.0 standard drinks  ?  Types: 2 Cans of beer per week  ?  Comment: 1-2 beers 1-2 days a week  ? Drug use: Never  ?  Sexual activity: Not Currently  ?Other Topics Concern  ? Not on file  ?Social History Narrative  ? Caffeine: occasional  ? Lives alone, near parents.    ? Occupation: works at [employer] part time  ? Edu: some college  ? Diet: some water, some fruits/vegetables, 1-2x/wk red meat, fish 1x/wk  ? ?Social Determinants of Health  ? ?Financial Resource Strain: Not on file  ?Food Insecurity: No Food Insecurity  ? Worried About Programme researcher, broadcasting/film/video in the Last Year: Never true  ? Ran Out of Food in the Last Year: Never true  ?Transportation Needs: No Transportation Needs  ? Lack of Transportation (Medical): No  ? Lack of Transportation (Non-Medical): No  ?Physical Activity: Not on file  ?Stress: Not on file  ?Social Connections: Not on file  ?Intimate Partner Violence: Not on file  ? ? ?Review of Systems  ?Psychiatric/Behavioral:  Positive for depression. Negative for suicidal ideas. The patient is not nervous/anxious.   ?All other systems reviewed and are negative. ? ?   ? ? ?Objective   ? ?BP 122/87   Pulse 80   Temp 98.1 ?F (36.7 ?C) (Oral)   Resp 18   Wt (!) 394 lb (178.7 kg)   SpO2 95%   BMI 51.98 kg/m?  ? ?Physical Exam ?Vitals and nursing note reviewed.  ?Constitutional:   ?   General: He is not in acute distress. ?   Appearance: He is obese.  ?Cardiovascular:  ?   Rate and Rhythm: Normal rate and regular rhythm.  ?Pulmonary:  ?   Effort: Pulmonary effort is normal.  ?   Breath sounds: Normal breath sounds.  ?Abdominal:  ?   Palpations: Abdomen is soft.  ?   Tenderness: There is no abdominal tenderness.  ?Musculoskeletal:  ?   Right lower leg: Edema present.  ?   Left lower leg: Edema present.  ?Neurological:  ?   General: No focal deficit present.  ?   Mental Status: He is alert and oriented to person, place, and time.  ?Psychiatric:     ?   Mood and Affect: Mood and affect normal.     ?   Speech: Speech normal.     ?   Behavior: Behavior normal. Behavior is cooperative.  ? ? ? ?  ? ?Assessment & Plan:  ? ?1. Essential hypertension ?Appears stable with present management. Continue. Meds refilled.  ? ?2. Obstructive sleep apnea ?Will look to any options for cpap as patient is without insurance at this time.  ? ?3. Class 3 severe obesity due to excess calories with serious comorbidity and body mass index (BMI) of 50.0 to 59.9 in adult [facility]) ?Patient with 16 lbs weight loss. Encouraged continued diet and exercise options.  ? ?4. Depression, unspecified depression type ?Will increase zoloft from 50 mg to 100mg  daily and monitor ? ? ? ?Return in about 3 months (around 10/13/2021) for follow up.  ? ?10/15/2021, MD ? ? ?

## 2021-08-13 ENCOUNTER — Other Ambulatory Visit: Payer: Self-pay

## 2021-08-17 ENCOUNTER — Other Ambulatory Visit: Payer: Self-pay

## 2021-09-14 ENCOUNTER — Encounter: Payer: Self-pay | Admitting: Family Medicine

## 2021-09-20 ENCOUNTER — Other Ambulatory Visit: Payer: Self-pay

## 2021-09-22 ENCOUNTER — Other Ambulatory Visit: Payer: Self-pay

## 2021-10-13 ENCOUNTER — Ambulatory Visit (INDEPENDENT_AMBULATORY_CARE_PROVIDER_SITE_OTHER): Payer: Self-pay | Admitting: Family Medicine

## 2021-10-13 ENCOUNTER — Other Ambulatory Visit: Payer: Self-pay

## 2021-10-13 ENCOUNTER — Encounter: Payer: Self-pay | Admitting: Family Medicine

## 2021-10-13 VITALS — BP 141/89 | HR 80 | Temp 98.1°F | Resp 18 | Ht 74.0 in | Wt 371.0 lb

## 2021-10-13 DIAGNOSIS — F32A Depression, unspecified: Secondary | ICD-10-CM

## 2021-10-13 DIAGNOSIS — I10 Essential (primary) hypertension: Secondary | ICD-10-CM

## 2021-10-13 DIAGNOSIS — Z6841 Body Mass Index (BMI) 40.0 and over, adult: Secondary | ICD-10-CM

## 2021-10-13 MED ORDER — SERTRALINE HCL 100 MG PO TABS
100.0000 mg | ORAL_TABLET | Freq: Every day | ORAL | 1 refills | Status: DC
  Filled 2021-10-13 – 2021-10-25 (×2): qty 90, 90d supply, fill #0
  Filled 2022-02-03 – 2022-02-04 (×2): qty 90, 90d supply, fill #1

## 2021-10-13 MED ORDER — TORSEMIDE 20 MG PO TABS
20.0000 mg | ORAL_TABLET | Freq: Two times a day (BID) | ORAL | 1 refills | Status: DC
  Filled 2021-10-13: qty 180, 90d supply, fill #0
  Filled 2021-10-25: qty 60, 30d supply, fill #0
  Filled 2021-11-24: qty 60, 30d supply, fill #1
  Filled 2022-01-03: qty 60, 30d supply, fill #2
  Filled 2022-02-03: qty 60, 30d supply, fill #3

## 2021-10-13 NOTE — Progress Notes (Unsigned)
Patient is here for 3 month f/u. Patient said that he was denied disability and would like to talk with provider abut that issue

## 2021-10-13 NOTE — Progress Notes (Unsigned)
Established Patient Office Visit  Subjective    Patient ID: Christian [name], male    DOB: June 06, 1975  Age: 46 y.o. MRN: [id number]  CC:  Chief Complaint  Patient presents with   Follow-up    3 months    HPI Christian [name] presents   Outpatient Encounter Medications as of 10/13/2021  Medication Sig   fluticasone (FLONASE) 50 MCG/ACT nasal spray Spray 2 sprays into both nostrils once daily.   OXYGEN Inhale 2 L/min into the lungs at bedtime.   sertraline (ZOLOFT) 100 MG tablet Take 1 tablet (100 mg total) by mouth once daily.   torsemide (DEMADEX) 20 MG tablet Take 1 tablet (20 mg total) by mouth 2 (two) times daily.   vitamin B-12 (CYANOCOBALAMIN) 100 MCG tablet Take 1 tablet (100 mcg total) by mouth once daily.   [DISCONTINUED] sertraline (ZOLOFT) 100 MG tablet Take 1 tablet (100 mg total) by mouth daily.   Facility-Administered Encounter Medications as of 10/13/2021  Medication   cholecalciferol (VITAMIN D3) tablet 1,000 Units    Past Medical History:  Diagnosis Date   Allergic rhinitis    Anxiety    CHF (congestive heart failure) (HCC)    Depression    Elevated blood pressure reading without diagnosis of hypertension    History of chicken pox    Obesity    Sepsis (HCC)    Sleep apnea    O2 at night    Past Surgical History:  Procedure Laterality Date   COLON SURGERY     perforated bowel   TONSILLECTOMY AND ADENOIDECTOMY  03/01/1983    Family History  Problem Relation Age of Onset   Stroke Mother    Diabetes Mother        pre-diabetes   Heart disease Mother        CHF, pacer   Cancer Mother        lymphoma   Stomach cancer Mother    Clotting disorder Mother    Coronary artery disease Father 31       MI   Hypertension Father    Hyperlipidemia Father    Diabetes Father    Heart attack Father    Migraines Sister    Stroke Paternal Grandmother    Diabetes Paternal Grandmother    Heart disease Paternal Grandfather    Parkinson's disease  Paternal Grandfather    Heart attack Paternal Grandfather     Social History   Socioeconomic History   Marital status: Single    Spouse name: Not on file   Number of children: 0   Years of education: Not on file   Highest education level: Not on file  Occupational History   Not on file  Tobacco Use   Smoking status: Never   Smokeless tobacco: Never   Tobacco comments:    Living in homeless shelter  Vaping Use   Vaping Use: Never used  Substance and Sexual Activity   Alcohol use: Yes    Alcohol/week: 2.0 standard drinks of alcohol    Types: 2 Cans of beer per week    Comment: 1-2 beers 1-2 days a week   Drug use: Never   Sexual activity: Not Currently  Other Topics Concern   Not on file  Social History Narrative   Caffeine: occasional   Lives alone, near parents.     Occupation: works at [employer] part time   Edu: some college   Diet: some water, some fruits/vegetables, 1-2x/wk red meat, fish 1x/wk  Social Determinants of Health   Financial Resource Strain: Not on file  Food Insecurity: No Food Insecurity (12/24/2020)   Hunger Vital Sign    Worried About Running Out of Food in the Last Year: Never true    Ran Out of Food in the Last Year: Never true  Transportation Needs: No Transportation Needs (12/24/2020)   PRAPARE - Administrator, Civil Service (Medical): No    Lack of Transportation (Non-Medical): No  Physical Activity: Not on file  Stress: Not on file  Social Connections: Not on file  Intimate Partner Violence: Not on file    ROS      Objective    BP (!) 141/89   Pulse 80   Temp 98.1 F (36.7 C) (Oral)   Resp 18   Ht 6\' 2"  (1.88 m)   Wt (!) 371 lb (168.3 kg)   SpO2 94%   BMI 47.63 kg/m   Physical Exam  {Labs (Optional):23779}    Assessment & Plan:   Problem List Items Addressed This Visit       Cardiovascular and Mediastinum   Essential hypertension - Primary     Other   Depression   Other Visit Diagnoses      Class 3 severe obesity due to excess calories with serious comorbidity and body mass index (BMI) of 50.0 to 59.9 in adult [facility]           No follow-ups on file.   [facility], MD

## 2021-10-14 ENCOUNTER — Other Ambulatory Visit: Payer: Self-pay | Admitting: Family Medicine

## 2021-10-14 ENCOUNTER — Encounter: Payer: Self-pay | Admitting: Family Medicine

## 2021-10-14 DIAGNOSIS — F32A Depression, unspecified: Secondary | ICD-10-CM

## 2021-10-25 ENCOUNTER — Other Ambulatory Visit: Payer: Self-pay

## 2021-10-26 ENCOUNTER — Other Ambulatory Visit: Payer: Self-pay

## 2021-11-24 ENCOUNTER — Other Ambulatory Visit: Payer: Self-pay

## 2021-11-26 ENCOUNTER — Other Ambulatory Visit: Payer: Self-pay

## 2022-01-03 ENCOUNTER — Other Ambulatory Visit: Payer: Self-pay

## 2022-01-04 ENCOUNTER — Other Ambulatory Visit: Payer: Self-pay

## 2022-01-07 DIAGNOSIS — U071 COVID-19: Secondary | ICD-10-CM | POA: Insufficient documentation

## 2022-01-08 ENCOUNTER — Other Ambulatory Visit: Payer: Self-pay

## 2022-01-08 ENCOUNTER — Emergency Department (HOSPITAL_COMMUNITY)
Admission: EM | Admit: 2022-01-08 | Discharge: 2022-01-09 | Disposition: A | Discharge status: Home / Self Care | Payer: Medicaid Other | Attending: Emergency Medicine | Admitting: Emergency Medicine

## 2022-01-08 ENCOUNTER — Encounter (HOSPITAL_COMMUNITY): Payer: Self-pay | Admitting: Emergency Medicine

## 2022-01-08 ENCOUNTER — Emergency Department (HOSPITAL_COMMUNITY): Payer: Medicaid Other

## 2022-01-08 DIAGNOSIS — Z79899 Other long term (current) drug therapy: Secondary | ICD-10-CM | POA: Insufficient documentation

## 2022-01-08 DIAGNOSIS — U071 COVID-19: Secondary | ICD-10-CM | POA: Insufficient documentation

## 2022-01-08 DIAGNOSIS — I509 Heart failure, unspecified: Secondary | ICD-10-CM | POA: Insufficient documentation

## 2022-01-08 LAB — CBC WITH DIFFERENTIAL/PLATELET
Abs Immature Granulocytes: 0.04 10*3/uL (ref 0.00–0.07)
Basophils Absolute: 0.1 10*3/uL (ref 0.0–0.1)
Basophils Relative: 1 %
Eosinophils Absolute: 0 10*3/uL (ref 0.0–0.5)
Eosinophils Relative: 0 %
HCT: 42.8 % (ref 39.0–52.0)
Hemoglobin: 14 g/dL (ref 13.0–17.0)
Immature Granulocytes: 1 %
Lymphocytes Relative: 12 %
Lymphs Abs: 0.8 10*3/uL (ref 0.7–4.0)
MCH: 27 pg (ref 26.0–34.0)
MCHC: 32.7 g/dL (ref 30.0–36.0)
MCV: 82.5 fL (ref 80.0–100.0)
Monocytes Absolute: 1.3 10*3/uL — ABNORMAL HIGH (ref 0.1–1.0)
Monocytes Relative: 19 %
Neutro Abs: 4.4 10*3/uL (ref 1.7–7.7)
Neutrophils Relative %: 67 %
Platelets: 300 10*3/uL (ref 150–400)
RBC: 5.19 MIL/uL (ref 4.22–5.81)
RDW: 13.4 % (ref 11.5–15.5)
WBC: 6.5 10*3/uL (ref 4.0–10.5)
nRBC: 0 % (ref 0.0–0.2)

## 2022-01-08 LAB — BASIC METABOLIC PANEL
Anion gap: 11 (ref 5–15)
BUN: 10 mg/dL (ref 6–20)
CO2: 24 mmol/L (ref 22–32)
Calcium: 8.6 mg/dL — ABNORMAL LOW (ref 8.9–10.3)
Chloride: 100 mmol/L (ref 98–111)
Creatinine, Ser: 1.13 mg/dL (ref 0.61–1.24)
GFR, Estimated: >60 mL/min (ref >60)
Glucose, Bld: 108 mg/dL — ABNORMAL HIGH (ref 70–99)
Potassium: 3.4 mmol/L — ABNORMAL LOW (ref 3.5–5.1)
Sodium: 135 mmol/L (ref 135–145)

## 2022-01-08 LAB — TROPONIN I (HIGH SENSITIVITY)
Troponin I (High Sensitivity): 13 ng/L (ref <18)
Troponin I (High Sensitivity): 13 ng/L (ref <18)

## 2022-01-08 LAB — RESP PANEL BY RT-PCR (FLU A&B, COVID) ARPGX2
Influenza A by PCR: NEGATIVE
Influenza B by PCR: NEGATIVE
SARS Coronavirus 2 by RT PCR: POSITIVE — AB

## 2022-01-08 LAB — BRAIN NATRIURETIC PEPTIDE: B Natriuretic Peptide: 42.9 pg/mL (ref 0.0–100.0)

## 2022-01-08 NOTE — ED Provider Triage Note (Signed)
Emergency Medicine Provider Triage Evaluation Note  Christian [name] , a 46 y.o. male  was evaluated in triage.  Pt complains of cough, sob. Hx of CHF, not taking meds due to weakness. No CP, fever. Associated rhinorrhea  Review of Systems  Positive: Sob, congestion Negative:   Physical Exam  There were no vitals taken for this visit. Gen:   Awake, no distress   Resp:  Normal effort  MSK:   Moves extremities without difficulty  Other:    Medical Decision Making  Medically screening exam initiated at 12:53 PM.  Appropriate orders placed.  Christian [name] was informed that the remainder of the evaluation will be completed by another provider, this initial triage assessment does not replace that evaluation, and the importance of remaining in the ED until their evaluation is complete.  Sob, cough   Christian [name] A, PA-C 01/08/22 1255

## 2022-01-08 NOTE — ED Triage Notes (Signed)
Patient here w/ cough, runny nose, generalized weakness, shortness of breath with exertion. Patient with CHF not taking his meds in the last few days because he's been too weak to walk to retrieve them.    SpO2- 96% RA decreased to 92% w/ exertion and was placed on 2 L [place].  BP-110 palp HR- 84 Resp 20

## 2022-01-09 MED ORDER — BENZONATATE 100 MG PO CAPS: 100.0000 mg | ORAL_CAPSULE | Freq: Three times a day (TID) | ORAL | 0 refills | Status: DC

## 2022-01-09 MED ORDER — MOLNUPIRAVIR EUA 200MG CAPSULE: 4.0000 | ORAL_CAPSULE | Freq: Two times a day (BID) | ORAL | 0 refills | Status: AC

## 2022-01-09 NOTE — ED Notes (Signed)
Discharge instructions reviewed with patient. Patient denies any questions or concerns.   

## 2022-01-09 NOTE — Discharge Instructions (Addendum)
Your work-up today showed that you have COVID infection.  Your chest x-ray is without any concerning findings.  No evidence on exam or your work-up to show that you are in decompensated heart failure.  He did mention you have not been taking your torsemide the past few days.  I have given you a dose in the emergency room.  Please take this as you are prescribed.  I also recommend that she take Mucinex for your cough, I have given you a prescription for cough as well called Tessalon Perles.  Please drink plenty of fluids.  If you have any worsening in shortness of breath, or chest pain please return to the emergency room for evaluation.

## 2022-01-09 NOTE — ED Provider Notes (Signed)
[facility] EMERGENCY DEPARTMENT Provider Note   CSN: [id number] Arrival date & time: 01/08/22  1253     History  Chief Complaint  Patient presents with   Cough   Shortness of Breath    Christian [name] is a 46 y.o. male.  46 year old male with past medical history significant for CHF, obesity, depression, OSA presents today for evaluation of shortness of breath, chest pain, and fatigue since Thursday.  He endorses decreased fluid intake over this timeframe.  He also has not been compliant with his torsemide over the past couple days as well.  He is without any worsening peripheral edema, orthopnea, or PND over this timeframe.  Patient stays at a boardinghouse.  States he has his own room that he typically stays in.  Denies any known sick contacts.  Denies prior history of DVT or PE.  The history is provided by the patient. No language interpreter was used.       Home Medications Prior to Admission medications   Medication Sig Start Date End Date Taking? Authorizing Provider  benzonatate (TESSALON) 100 MG capsule Take 1 capsule (100 mg total) by mouth every 8 (eight) hours. 01/09/22  Yes Milan Clare, PA-C  molnupiravir EUA (LAGEVRIO) 200 mg CAPS capsule Take 4 capsules (800 mg total) by mouth 2 (two) times daily for 5 days. 01/09/22 01/14/22 Yes Emaan Gary, PA-C  fluticasone (FLONASE) 50 MCG/ACT nasal spray Spray 2 sprays into both nostrils once daily. 02/12/21   Nicole Kindred A, DO  OXYGEN Inhale 2 L/min into the lungs at bedtime.    [provider]  sertraline (ZOLOFT) 100 MG tablet Take 1 tablet (100 mg total) by mouth once daily. 10/13/21   Dorna Mai, MD  torsemide (DEMADEX) 20 MG tablet Take 1 tablet (20 mg total) by mouth 2 (two) times daily. 10/13/21   Dorna Mai, MD  vitamin B-12 (CYANOCOBALAMIN) 100 MCG tablet Take 1 tablet (100 mcg total) by mouth once daily. 12/24/20   Regalado, Cassie Freer, MD      Allergies    Shellfish allergy     Review of Systems   Review of Systems  Constitutional:  Negative for chills and fever.  HENT:  Positive for congestion and postnasal drip. Negative for sore throat and trouble swallowing.   Respiratory:  Positive for cough and shortness of breath.   Cardiovascular:  Positive for chest pain. Negative for palpitations and leg swelling.  Gastrointestinal:  Negative for abdominal pain.  All other systems reviewed and are negative.   Physical Exam Updated Vital Signs BP (!) 154/88   Pulse 84   Temp 98.2 F (36.8 C)   Resp (!) 24   SpO2 98%  Physical Exam Vitals and nursing note reviewed.  Constitutional:      General: He is not in acute distress.    Appearance: Normal appearance. He is obese. He is not ill-appearing.  HENT:     Head: Normocephalic and atraumatic.     Nose: Nose normal.  Eyes:     General: No scleral icterus.    Extraocular Movements: Extraocular movements intact.     Conjunctiva/sclera: Conjunctivae normal.  Cardiovascular:     Rate and Rhythm: Normal rate and regular rhythm.     Pulses: Normal pulses.  Pulmonary:     Effort: Pulmonary effort is normal. No respiratory distress.     Breath sounds: Normal breath sounds. No wheezing or rales.  Abdominal:     General: There is no distension.  Tenderness: There is no abdominal tenderness.  Musculoskeletal:        General: Normal range of motion.     Cervical back: Normal range of motion.     Right lower leg: No edema.     Left lower leg: No edema.  Skin:    General: Skin is warm and dry.  Neurological:     General: No focal deficit present.     Mental Status: He is alert and oriented to person, place, and time. Mental status is at baseline.     ED Results / Procedures / Treatments   Labs (all labs ordered are listed, but only abnormal results are displayed) Labs Reviewed  RESP PANEL BY RT-PCR (FLU A&B, COVID) ARPGX2 - Abnormal; Notable for the following components:      Result Value   SARS  Coronavirus 2 by RT PCR POSITIVE (*)    All other components within normal limits  CBC WITH DIFFERENTIAL/PLATELET - Abnormal; Notable for the following components:   Monocytes Absolute 1.3 (*)    All other components within normal limits  BASIC METABOLIC PANEL - Abnormal; Notable for the following components:   Potassium 3.4 (*)    Glucose, Bld 108 (*)    Calcium 8.6 (*)    All other components within normal limits  BRAIN NATRIURETIC PEPTIDE  TROPONIN I (HIGH SENSITIVITY)  TROPONIN I (HIGH SENSITIVITY)    EKG None  Radiology DG Chest 2 View  Result Date: 01/08/2022 CLINICAL DATA:  Cough, shortness of breath EXAM: CHEST - 2 VIEW COMPARISON:  01/08/2021 FINDINGS: Cardiac size is within normal limits. There are no signs of pulmonary edema. Increased interstitial markings are seen in right parahilar region and right lower lung field. There is interval improvement in aeration in right lung. There is no focal consolidation. There is no pneumothorax. There is blunting of right lateral CP angle. IMPRESSION: Subtle increase in interstitial markings in right parahilar region and right lower lung fields may suggest interstitial pneumonia or scarring. Blunting of right lateral CP angle suggests possible small pleural effusion. Electronically Signed   By: Elmer Picker M.D.   On: 01/08/2022 13:59    Procedures Procedures    Medications Ordered in ED Medications - No data to display  ED Course/ Medical Decision Making/ A&P                           Medical Decision Making Risk Prescription drug management.   Medical Decision Making / ED Course   This patient presents to the ED for concern of fatigue, shortness of breath, this involves an extensive number of treatment options, and is a complaint that carries with it a high risk of complications and morbidity.  The differential diagnosis includes CHF exacerbation, pneumonia, viral URI, ACS, PE  MDM: 46 year old male with past  medical history as noted above presents today for evaluation of shortness of breath, cough, fatigue for the past 3 to 4 days.  Exam without signs of volume overload.  BNP, troponin within normal limits.  Chest x-ray without evidence of volume overload.  Interstitial markings noted on chest x-ray.  He does have a large surgical scar that extends from mid abdomen to his lower chest.  Likely scarring.  Work-up otherwise without leukocytosis or anemia.  He is COVID-positive.  Renal function preserved, potassium 3.4.  No other acute concerns.  I did ambulate patient within the room and he maintained sats of around 95% on room  air.  He ambulated without difficulty.  Patient states he drank some Gatorade while out in the waiting room.  He is without tachycardia, hypoxia, or tachypnea with the exception of 2 recent respirations that were documented.  However during my evaluation he was without tachypnea.  Low suspicion for PE.  Low risk on Wells criteria.  Patient's chest pain likely from coughing spells.  No evidence of myocarditis.  EKG without acute ischemic changes.  Will prescribe molnupiravir, Tessalon Perles for symptomatic management and COVID given his comorbidities.  Strict return precautions discussed.  Patient voices understanding and is in agreement with plan.  Discussed compliance with his home torsemide. Chart reviewed from previous admission where patient presented in respiratory distress.  He was hypoxic on arrival.  His BNP was elevated at that time.  Not consistent with his current presentation.   Lab Tests: -I ordered, reviewed, and interpreted labs.   The pertinent results include:   Labs Reviewed  RESP PANEL BY RT-PCR (FLU A&B, COVID) ARPGX2 - Abnormal; Notable for the following components:      Result Value   SARS Coronavirus 2 by RT PCR POSITIVE (*)    All other components within normal limits  CBC WITH DIFFERENTIAL/PLATELET - Abnormal; Notable for the following components:   Monocytes  Absolute 1.3 (*)    All other components within normal limits  BASIC METABOLIC PANEL - Abnormal; Notable for the following components:   Potassium 3.4 (*)    Glucose, Bld 108 (*)    Calcium 8.6 (*)    All other components within normal limits  BRAIN NATRIURETIC PEPTIDE  TROPONIN I (HIGH SENSITIVITY)  TROPONIN I (HIGH SENSITIVITY)      EKG  EKG Interpretation  Date/Time:    Ventricular Rate:    PR Interval:    QRS Duration:   QT Interval:    QTC Calculation:   R Axis:     Text Interpretation:           Imaging Studies ordered: I ordered imaging studies including chest x-ray I independently visualized and interpreted imaging. I agree with the radiologist interpretation   Medicines ordered and prescription drug management: Meds ordered this encounter  Medications   molnupiravir EUA (LAGEVRIO) 200 mg CAPS capsule    Sig: Take 4 capsules (800 mg total) by mouth 2 (two) times daily for 5 days.    Dispense:  40 capsule    Refill:  0    Order Specific Question:   Supervising Provider    Answer:   MILLER, BRIAN [[id number]]   benzonatate (TESSALON) 100 MG capsule    Sig: Take 1 capsule (100 mg total) by mouth every 8 (eight) hours.    Dispense:  21 capsule    Refill:  0    Order Specific Question:   Supervising Provider    Answer:   MILLER, BRIAN [[id number]]    -I have reviewed the patients home medicines and have made adjustments as needed  Cardiac Monitoring: The patient was maintained on a cardiac monitor.  I personally viewed and interpreted the cardiac monitored which showed an underlying rhythm of: Normal sinus rhythm  Reevaluation: After the interventions noted above, I reevaluated the patient and found that they have :stayed the same  Co morbidities that complicate the patient evaluation  Past Medical History:  Diagnosis Date   Allergic rhinitis    Anxiety    CHF (congestive heart failure) (HCC)    Depression    Elevated blood pressure reading without diagnosis  of hypertension    History of chicken pox    Obesity    Sepsis ([place])    Sleep apnea    O2 at night      Dispostion: Patient is appropriate for discharge.  Discharged in stable condition.  Return precautions discussed.  Patient voices understanding and is in agreement with plan.  Admission considered however patient without hypoxia, ambulates without difficulty and maintaining sats.  No evidence of volume overload.  Final Clinical Impression(s) / ED Diagnoses Final diagnoses:  [id number]    Rx / DC Orders ED Discharge Orders          Ordered    molnupiravir EUA (LAGEVRIO) 200 mg CAPS capsule  2 times daily        01/09/22 0858    benzonatate (TESSALON) 100 MG capsule  Every 8 hours        01/09/22 0859              Evlyn Courier, PA-C 01/09/22 0920    Regan Lemming, MD 01/09/22 1245

## 2022-01-11 NOTE — Progress Notes (Deleted)
Erroneous encounter-disregard

## 2022-01-12 ENCOUNTER — Ambulatory Visit: Payer: Self-pay | Admitting: Family Medicine

## 2022-01-17 ENCOUNTER — Ambulatory Visit (INDEPENDENT_AMBULATORY_CARE_PROVIDER_SITE_OTHER): Payer: Self-pay | Admitting: Family Medicine

## 2022-01-17 VITALS — BP 119/86 | HR 89 | Temp 98.1°F | Resp 18 | Wt 360.6 lb

## 2022-01-17 DIAGNOSIS — I10 Essential (primary) hypertension: Secondary | ICD-10-CM

## 2022-01-17 DIAGNOSIS — Z6841 Body Mass Index (BMI) 40.0 and over, adult: Secondary | ICD-10-CM

## 2022-01-17 DIAGNOSIS — Z8616 Personal history of COVID-19: Secondary | ICD-10-CM

## 2022-01-18 ENCOUNTER — Encounter: Payer: Self-pay | Admitting: Family Medicine

## 2022-01-18 NOTE — Progress Notes (Signed)
Established Patient Office Visit  Subjective    Patient ID: Christian [name], male    DOB: Apr 27, 1975  Age: 46 y.o. MRN: [id number]  CC: No chief complaint on file.   HPI Christian [name] presents for follow up after having been dx with covid on last week. He was seen in ED with sx. He believes he may have been exposed on public transportation. Patient reports improved sx but still with intermittent cough.    Outpatient Encounter Medications as of 01/17/2022  Medication Sig   benzonatate (TESSALON) 100 MG capsule Take 1 capsule (100 mg total) by mouth every 8 (eight) hours.   fluticasone (FLONASE) 50 MCG/ACT nasal spray Spray 2 sprays into both nostrils once daily.   OXYGEN Inhale 2 L/min into the lungs at bedtime.   sertraline (ZOLOFT) 100 MG tablet Take 1 tablet (100 mg total) by mouth once daily.   torsemide (DEMADEX) 20 MG tablet Take 1 tablet (20 mg total) by mouth 2 (two) times daily.   vitamin B-12 (CYANOCOBALAMIN) 100 MCG tablet Take 1 tablet (100 mcg total) by mouth once daily.   [EXPIRED] molnupiravir EUA (LAGEVRIO) 200 mg CAPS capsule Take 4 capsules (800 mg total) by mouth 2 (two) times daily for 5 days.   Facility-Administered Encounter Medications as of 01/17/2022  Medication   cholecalciferol (VITAMIN D3) tablet 1,000 Units    Past Medical History:  Diagnosis Date   Allergic rhinitis    Anxiety    CHF (congestive heart failure) (HCC)    Depression    Elevated blood pressure reading without diagnosis of hypertension    History of chicken pox    Obesity    Sepsis (HCC)    Sleep apnea    O2 at night    Past Surgical History:  Procedure Laterality Date   COLON SURGERY     perforated bowel   TONSILLECTOMY AND ADENOIDECTOMY  03/01/1983    Family History  Problem Relation Age of Onset   Stroke Mother    Diabetes Mother        pre-diabetes   Heart disease Mother        CHF, pacer   Cancer Mother        lymphoma   Stomach cancer Mother    Clotting  disorder Mother    Coronary artery disease Father 76       MI   Hypertension Father    Hyperlipidemia Father    Diabetes Father    Heart attack Father    Migraines Sister    Stroke Paternal Grandmother    Diabetes Paternal Grandmother    Heart disease Paternal Grandfather    Parkinson's disease Paternal Grandfather    Heart attack Paternal Grandfather     Social History   Socioeconomic History   Marital status: Single    Spouse name: Not on file   Number of children: 0   Years of education: Not on file   Highest education level: Not on file  Occupational History   Not on file  Tobacco Use   Smoking status: Never   Smokeless tobacco: Never   Tobacco comments:    Living in homeless shelter  Vaping Use   Vaping Use: Never used  Substance and Sexual Activity   Alcohol use: Yes    Alcohol/week: 2.0 standard drinks of alcohol    Types: 2 Cans of beer per week    Comment: 1-2 beers 1-2 days a week   Drug use: Never   Sexual  activity: Not Currently  Other Topics Concern   Not on file  Social History Narrative   Caffeine: occasional   Lives alone, near parents.     Occupation: works at [employer] part time   Edu: some college   Diet: some water, some fruits/vegetables, 1-2x/wk red meat, fish 1x/wk   Social Determinants of Corporate investment banker Strain: Not on file  Food Insecurity: No Food Insecurity (12/24/2020)   Hunger Vital Sign    Worried About Running Out of Food in the Last Year: Never true    Ran Out of Food in the Last Year: Never true  Transportation Needs: No Transportation Needs (12/24/2020)   PRAPARE - Administrator, Civil Service (Medical): No    Lack of Transportation (Non-Medical): No  Physical Activity: Not on file  Stress: Not on file  Social Connections: Not on file  Intimate Partner Violence: Not on file    Review of Systems  Respiratory:  Negative for shortness of breath.   All other systems reviewed and are  negative.       Objective    BP 119/86   Pulse 89   Temp 98.1 F (36.7 C) (Oral)   Resp 18   Wt (!) 360 lb 9.6 oz (163.6 kg)   SpO2 92%   BMI 46.30 kg/m   Physical Exam      Assessment & Plan:   1. History of COVID-19 Improving sx  2. Essential hypertension Appears stable. continue  3. Class 3 severe obesity due to excess calories with serious comorbidity and body mass index (BMI) of 45.0 to 49.9 in adult [facility])   No follow-ups on file.   Tommie Raymond, MD

## 2022-02-03 ENCOUNTER — Other Ambulatory Visit: Payer: Self-pay

## 2022-02-04 ENCOUNTER — Other Ambulatory Visit: Payer: Self-pay

## 2022-03-02 ENCOUNTER — Ambulatory Visit (INDEPENDENT_AMBULATORY_CARE_PROVIDER_SITE_OTHER): Payer: Medicaid Other | Admitting: Family Medicine

## 2022-03-02 ENCOUNTER — Encounter: Payer: Self-pay | Admitting: Family Medicine

## 2022-03-02 VITALS — BP 136/91 | HR 78 | Temp 98.1°F | Resp 16 | Wt 371.6 lb

## 2022-03-02 DIAGNOSIS — G4733 Obstructive sleep apnea (adult) (pediatric): Secondary | ICD-10-CM | POA: Diagnosis not present

## 2022-03-02 DIAGNOSIS — Z6841 Body Mass Index (BMI) 40.0 and over, adult: Secondary | ICD-10-CM | POA: Diagnosis not present

## 2022-03-02 DIAGNOSIS — G4734 Idiopathic sleep related nonobstructive alveolar hypoventilation: Secondary | ICD-10-CM | POA: Diagnosis not present

## 2022-03-02 DIAGNOSIS — F32A Depression, unspecified: Secondary | ICD-10-CM | POA: Diagnosis not present

## 2022-03-02 DIAGNOSIS — I10 Essential (primary) hypertension: Secondary | ICD-10-CM | POA: Diagnosis not present

## 2022-03-02 MED ORDER — TORSEMIDE 20 MG PO TABS: 20.0000 mg | ORAL_TABLET | Freq: Two times a day (BID) | ORAL | 1 refills | Status: DC

## 2022-03-02 MED ORDER — FLUTICASONE PROPIONATE 50 MCG/ACT NA SUSP: 2.0000 | Freq: Every day | NASAL | 2 refills | Status: DC

## 2022-03-02 MED ORDER — SERTRALINE HCL 100 MG PO TABS: 100.0000 mg | ORAL_TABLET | Freq: Every day | ORAL | 1 refills | Status: DC

## 2022-03-02 NOTE — Progress Notes (Unsigned)
Patient is here for f/u Covid and referral some specialist.

## 2022-03-03 ENCOUNTER — Encounter: Payer: Self-pay | Admitting: Family Medicine

## 2022-03-03 NOTE — Progress Notes (Signed)
Established Patient Office Visit  Subjective    Patient ID: Christian [name], male    DOB: 03/19/1975  Age: 47 y.o. MRN: [id number]  CC: No chief complaint on file.   HPI Christian [name] presents for follow up of chronic med issues. Patient reports htat his breathing has improved. He now has insurance and can complete workups and follow up with consultants.    Outpatient Encounter Medications as of 03/02/2022  Medication Sig   fluticasone (FLONASE) 50 MCG/ACT nasal spray Spray 2 sprays into both nostrils once daily.   OXYGEN Inhale 2 L/min into the lungs at bedtime.   sertraline (ZOLOFT) 100 MG tablet Take 1 tablet (100 mg total) by mouth once daily.   torsemide (DEMADEX) 20 MG tablet Take 1 tablet (20 mg total) by mouth 2 (two) times daily.   vitamin B-12 (CYANOCOBALAMIN) 100 MCG tablet Take 1 tablet (100 mcg total) by mouth once daily.   [DISCONTINUED] benzonatate (TESSALON) 100 MG capsule Take 1 capsule (100 mg total) by mouth every 8 (eight) hours.   [DISCONTINUED] fluticasone (FLONASE) 50 MCG/ACT nasal spray Spray 2 sprays into both nostrils once daily.   [DISCONTINUED] sertraline (ZOLOFT) 100 MG tablet Take 1 tablet (100 mg total) by mouth once daily.   [DISCONTINUED] torsemide (DEMADEX) 20 MG tablet Take 1 tablet (20 mg total) by mouth 2 (two) times daily.   Facility-Administered Encounter Medications as of 03/02/2022  Medication   cholecalciferol (VITAMIN D3) tablet 1,000 Units    Past Medical History:  Diagnosis Date   Allergic rhinitis    Anxiety    CHF (congestive heart failure) (HCC)    Depression    Elevated blood pressure reading without diagnosis of hypertension    History of chicken pox    Obesity    Sepsis ([place])    Sleep apnea    O2 at night    Past Surgical History:  Procedure Laterality Date   COLON SURGERY     perforated bowel   TONSILLECTOMY AND ADENOIDECTOMY  03/01/1983    Family History  Problem Relation Age of Onset   Stroke Mother     Diabetes Mother        pre-diabetes   Heart disease Mother        CHF, pacer   Cancer Mother        lymphoma   Stomach cancer Mother    Clotting disorder Mother    Coronary artery disease Father 50       MI   Hypertension Father    Hyperlipidemia Father    Diabetes Father    Heart attack Father    Migraines Sister    Stroke Paternal Grandmother    Diabetes Paternal Grandmother    Heart disease Paternal Grandfather    Parkinson's disease Paternal Grandfather    Heart attack Paternal Grandfather     Social History   Socioeconomic History   Marital status: Single    Spouse name: Not on file   Number of children: 0   Years of education: Not on file   Highest education level: Not on file  Occupational History   Not on file  Tobacco Use   Smoking status: Never   Smokeless tobacco: Never   Tobacco comments:    Living in homeless shelter  Vaping Use   Vaping Use: Never used  Substance and Sexual Activity   Alcohol use: Yes    Alcohol/week: 2.0 standard drinks of alcohol    Types: 2 Cans of beer  per week    Comment: 1-2 beers 1-2 days a week   Drug use: Never   Sexual activity: Not Currently  Other Topics Concern   Not on file  Social History Narrative   Caffeine: occasional   Lives alone, near parents.     Occupation: works at [employer] part time   Edu: some college   Diet: some water, some fruits/vegetables, 1-2x/wk red meat, fish 1x/wk   Social Determinants of Radio broadcast assistant Strain: Not on file  Food Insecurity: No [place] (12/24/2020)   Hunger Vital Sign    Worried About Running Out of Food in the Last Year: Never true    [place] in the Last Year: Never true  Transportation Needs: No Transportation Needs (12/24/2020)   PRAPARE - Hydrologist (Medical): No    Lack of Transportation (Non-Medical): No  Physical Activity: Not on file  Stress: Not on file  Social Connections: Not on file  Intimate  Partner Violence: Not on file    Review of Systems  Constitutional:  Negative for chills and fever.  Respiratory:  Positive for shortness of breath.   All other systems reviewed and are negative.       Objective    BP (!) 136/91   Pulse 78   Temp 98.1 F (36.7 C) (Oral)   Resp 16   Wt (!) 371 lb 9.6 oz (168.6 kg)   SpO2 93%   BMI 47.71 kg/m   Physical Exam Vitals and nursing note reviewed.  Constitutional:      General: He is not in acute distress.    Appearance: He is obese.  Cardiovascular:     Rate and Rhythm: Normal rate and regular rhythm.  Pulmonary:     Effort: Pulmonary effort is normal.     Breath sounds: Normal breath sounds.  Abdominal:     Palpations: Abdomen is soft.     Tenderness: There is no abdominal tenderness.  Musculoskeletal:     Right lower leg: No edema.     Left lower leg: No edema.  Neurological:     General: No focal deficit present.     Mental Status: He is alert and oriented to person, place, and time.  Psychiatric:        Mood and Affect: Mood and affect normal.        Speech: Speech normal.        Behavior: Behavior normal. Behavior is cooperative.         Assessment & Plan:   1. Essential hypertension Slightly elevated. Continue and monitor  2. Depression, unspecified depression type Improved. Continue present management  3. Obstructive sleep apnea Referral to pulmonary for further eval/mgt - Ambulatory referral to Pulmonology - Cpap titration; Future  4. Nocturnal hypoxia As above.  - Ambulatory referral to Pulmonology - Cpap titration; Future  5. Class 3 severe obesity due to excess calories with serious comorbidity and body mass index (BMI) of 45.0 to 49.9 in adult [facility])     Return in about 3 months (around 06/01/2022) for follow up.   Becky Sax, MD

## 2022-03-17 ENCOUNTER — Ambulatory Visit (HOSPITAL_BASED_OUTPATIENT_CLINIC_OR_DEPARTMENT_OTHER): Payer: Medicaid Other | Attending: Family Medicine | Admitting: Internal Medicine

## 2022-03-17 VITALS — Ht 74.0 in | Wt 365.0 lb

## 2022-03-17 DIAGNOSIS — G4733 Obstructive sleep apnea (adult) (pediatric): Secondary | ICD-10-CM | POA: Diagnosis present

## 2022-03-17 DIAGNOSIS — G4761 Periodic limb movement disorder: Secondary | ICD-10-CM | POA: Insufficient documentation

## 2022-03-17 DIAGNOSIS — G4734 Idiopathic sleep related nonobstructive alveolar hypoventilation: Secondary | ICD-10-CM

## 2022-03-18 ENCOUNTER — Telehealth: Payer: Self-pay | Admitting: *Deleted

## 2022-03-18 ENCOUNTER — Encounter: Payer: Self-pay | Admitting: Adult Health

## 2022-03-18 ENCOUNTER — Ambulatory Visit (INDEPENDENT_AMBULATORY_CARE_PROVIDER_SITE_OTHER): Payer: Medicaid Other | Admitting: Adult Health

## 2022-03-18 VITALS — BP 120/86 | HR 84 | Temp 98.2°F | Ht 74.0 in | Wt 373.2 lb

## 2022-03-18 DIAGNOSIS — R0609 Other forms of dyspnea: Secondary | ICD-10-CM | POA: Diagnosis not present

## 2022-03-18 DIAGNOSIS — I5032 Chronic diastolic (congestive) heart failure: Secondary | ICD-10-CM | POA: Diagnosis not present

## 2022-03-18 DIAGNOSIS — R918 Other nonspecific abnormal finding of lung field: Secondary | ICD-10-CM

## 2022-03-18 DIAGNOSIS — G4733 Obstructive sleep apnea (adult) (pediatric): Secondary | ICD-10-CM | POA: Diagnosis not present

## 2022-03-18 DIAGNOSIS — Z6841 Body Mass Index (BMI) 40.0 and over, adult: Secondary | ICD-10-CM

## 2022-03-18 DIAGNOSIS — R0600 Dyspnea, unspecified: Secondary | ICD-10-CM | POA: Insufficient documentation

## 2022-03-18 NOTE — Assessment & Plan Note (Signed)
Suspect is multifactorial in nature.  May have a component of deconditioning.  Needs further workup.  Previous CT chest did show multifocal pneumonia without follow-up imaging.  Also had some scattered lung nodules that will need serial follow-up.  Patient did have COVID-19 and x-ray during that ER visit did show some increased interstitial markings.  Will set up a CT chest in the next few weeks for further evaluation.  Have PFTs on return visit.  Plan  Patient Instructions  CT chest in 6 weeks follow up lung nodules   Once CPAP titration study results are available will start CPAP  Work on healthy weight loss  Do not drive if sleepy  Work on healthy sleep regimen  Follow up in 6-8 weeks with PFTs and As needed

## 2022-03-18 NOTE — Assessment & Plan Note (Signed)
Patient with known moderate sleep apnea diagnosed on in lab sleep study June 14, 2021, patient went for a CPAP titration study yesterday.  Results are pending.  Suspect patient has a component of OSA and OHS.  Will wait for titration study results before ordering sleep device.  Patient education given on sleep apnea.  We also went over different sleep devices such as CPAP and BiPAP with patient education given.  Plan  Patient Instructions  CT chest in 6 weeks follow up lung nodules   Once CPAP titration study results are available will start CPAP  Work on healthy weight loss  Do not drive if sleepy  Work on healthy sleep regimen  Follow up in 6-8 weeks with PFTs and As needed

## 2022-03-18 NOTE — Telephone Encounter (Signed)
Dr. Annamaria Boots, Please message Rexene Edison NP after you have read the results of the split night study that was completed on 03/17/2022 for patient as she did his sleep consult today and will be doing his follow up.

## 2022-03-18 NOTE — Progress Notes (Signed)
@Patient  ID: , male    DOB: 02/18/1976, 47 y.o.   MRN: [id number]  Chief Complaint  Patient presents with   Consult    Referring provider: [id number], MD  HPI: 47 year old male never smoker seen for sleep consult March 18, 2022 to establish for sleep apnea.  Medical history significant for Diastolic CHF  Recurrent hospitalization 2022 with perforated bowel s/p resection, acute respiratory failure , Influenza, sepsis, , decubitus ulcer. Discharged on Oxygen for hypoxic respiratory failure . Felt to have OSA and OHS.   TEST/EVENTS :  NPSG June 14, 2021 showed moderate sleep apnea with AHI at 25.2/hour, AHI during REM was 82/hour, SpO2 low 71%   03/18/2022 Sleep consult  Patient presents for a sleep consult.  Kindly referred by his primary care provider Dr. 03/20/2022.  Patient was set up for a sleep study April 2023.  Showed moderate sleep apnea with AHI 25.2/hour.  AHI during REM sleep was 82/hour, SpO2 low at 71%.  Was set up for CPAP titration but waited until he had insurance coverage. Went for CPAP titration study last night , results are pending . Goes to bed around midnight, takes 1 hr to go to sleep. Gets up at 10 am. Weight is down at 100lbs over the next 2 years.  Current weight is at 373 pounds with a BMI of 47.  No symptoms of cataplexy or sleep paralysis.  Patient does not take any sleep aids.  Caffeine intake is minimal.  Patient says he feels very sleepy throughout the day.  Has snoring and restless sleep.  Epworth score is 14 out of 24 typically gets sleepy if he sits down to watch TV, in the afternoon hours and after eating.  Patient also complains of being short of breath with activities.  Says that he is not active because he gets winded very easily.  He denies any significant cough or wheezing.  No former diagnosis of asthma or COPD.  He is a never smoker.  He denies any associated chest pain.  Says he did have COVID-19 infection in November 2023.  Had URI  symptoms but did not require hospitalization.  He was seen in the emergency room and given molnupiravir.  chart review shows CT chest January 09, 2021 showed negative PE.  Multifocal pneumonia in the right lung, trace right pleural effusion and scattered bilateral pulmonary nodules measuring up to 7 mm.  Chest x-ray November 2023 showed increased interstitial markings in the right perihilar region and right lower lobe.  Medical history significant for hypertension, chronic diastolic heart failure, chronic allergies,, morbid obesity, lower extremity edema, stasis dermatitis changes, OHS, sleep apnea Critical illness in August 2022 with bowel perforation status post repair-prolonged hospitalization with multiple postop complications including acute hypoxic and hypercarbic respiratory failure, pleural effusion, sepsis, abdominal wall cellulitis   Social history patient lives with roommates.  He is unemployed trying to apply for disability.  Has financial instability that is very stressful.  Does not exercise.  Has no children.  He is single.  He is a never smoker.  Drinks socially less than 2 beers a week.  No history of drug use.  Has been homeless in the past.   Family history positive for allergies, heart disease, rheumatoid arthritis, cancer  Surgical history positive for tonsillectomy, perforated bowel status post repair August 2022  Allergies  Allergen Reactions   Shellfish Allergy Nausea Only    Immunization History  Administered Date(s) Administered   Influenza Split 11/30/2011  Influenza,inj,Quad PF,6+ Mos 11/07/2020    Past Medical History:  Diagnosis Date   Allergic rhinitis    Anxiety    CHF (congestive heart failure) (HCC)    Depression    Depression    Elevated blood pressure reading without diagnosis of hypertension    History of chicken pox    Obesity    Pleural effusion    Sepsis ([place])    Sleep apnea    O2 at night    Tobacco History: Social History    Tobacco Use  Smoking Status Never  Smokeless Tobacco Never  Tobacco Comments   Living in homeless shelter   Counseling given: Not Answered Tobacco comments: Living in homeless shelter   Outpatient Medications Prior to Visit  Medication Sig Dispense Refill   fluticasone (FLONASE) 50 MCG/ACT nasal spray Spray 2 sprays into both nostrils once daily. 16 g 2   OXYGEN Inhale 2 L/min into the lungs at bedtime.     sertraline (ZOLOFT) 100 MG tablet Take 1 tablet (100 mg total) by mouth once daily. 90 tablet 1   torsemide (DEMADEX) 20 MG tablet Take 1 tablet (20 mg total) by mouth 2 (two) times daily. 180 tablet 1   vitamin B-12 (CYANOCOBALAMIN) 100 MCG tablet Take 1 tablet (100 mcg total) by mouth once daily. 30 tablet 0   Facility-Administered Medications Prior to Visit  Medication Dose Route Frequency Provider Last Rate Last Admin   cholecalciferol (VITAMIN D3) tablet 1,000 Units  1,000 Units Oral Daily Regalado, Belkys A, MD         Review of Systems:   Constitutional:   No  weight loss, night sweats,  Fevers, chills, + fatigue, or  lassitude.  HEENT:   No headaches,  Difficulty swallowing,  Tooth/dental problems, or  Sore throat,                No sneezing, itching, ear ache, nasal congestion, post nasal drip,   CV:  No chest pain,  Orthopnea, PND,  anasarca, dizziness, palpitations, syncope.   GI  No heartburn, indigestion, abdominal pain, nausea, vomiting, diarrhea, change in bowel habits, loss of appetite, bloody stools.   Resp:   No chest wall deformity  Skin: no rash or lesions.  GU: no dysuria, change in color of urine, no urgency or frequency.  No flank pain, no hematuria   MS:  No joint pain or swelling.  No decreased range of motion.  No back pain.    Physical Exam  BP 120/86 (BP Location: Left Arm, Patient Position: Sitting, Cuff Size: Large)   Pulse 84   Temp 98.2 F (36.8 C) (Oral)   Ht 6\' 2"  (1.88 m)   Wt (!) 373 lb 3.2 oz (169.3 kg)   SpO2 95%    BMI 47.92 kg/m   GEN: A/Ox3; pleasant , NAD, well nourished    HEENT:  [place]/AT,  EACs-clear, TMs-wnl, NOSE-clear, THROAT-clear, no lesions, no postnasal drip or exudate noted.  Class III MP airway  NECK:  Supple w/ fair ROM; no JVD; normal carotid impulses w/o bruits; no thyromegaly or nodules palpated; no lymphadenopathy.    RESP  Clear  P & A; w/o, wheezes/ rales/ or rhonchi. no accessory muscle use, no dullness to percussion  CARD:  RRR, no m/r/g, 1+ peripheral edema states dermatitic changes, pulses intact, no cyanosis or clubbing.  GI:   Soft & nt; nml bowel sounds; no organomegaly or masses detected.   Musco: Warm bil, no deformities or joint swelling noted.  Neuro: alert, no focal deficits noted.    Skin: Warm, no lesions or rashes    Lab Results:  CBC    Component Value Date/Time   WBC 6.5 01/08/2022 1305   RBC 5.19 01/08/2022 1305   HGB 14.0 01/08/2022 1305   HGB 14.4 03/26/2021 0903   HCT 42.8 01/08/2022 1305   HCT 44.1 03/26/2021 0903   PLT 300 01/08/2022 1305   PLT 354 03/26/2021 0903   MCV 82.5 01/08/2022 1305   MCV 78 (L) 03/26/2021 0903   MCH 27.0 01/08/2022 1305   MCHC 32.7 01/08/2022 1305   RDW 13.4 01/08/2022 1305   RDW 14.7 03/26/2021 0903   LYMPHSABS 0.8 01/08/2022 1305   LYMPHSABS 1.7 03/26/2021 0903   MONOABS 1.3 (H) 01/08/2022 1305   EOSABS 0.0 01/08/2022 1305   EOSABS 0.4 03/26/2021 0903   BASOSABS 0.1 01/08/2022 1305   BASOSABS 0.1 03/26/2021 0903    BMET    Component Value Date/Time   NA 135 01/08/2022 1305   NA 143 03/26/2021 0903   K 3.4 (L) 01/08/2022 1305   CL 100 01/08/2022 1305   CO2 24 01/08/2022 1305   GLUCOSE 108 (H) 01/08/2022 1305   BUN 10 01/08/2022 1305   BUN 14 03/26/2021 0903   CREATININE 1.13 01/08/2022 1305   CALCIUM 8.6 (L) 01/08/2022 1305   GFRNONAA >60 01/08/2022 1305   GFRAA >60 09/23/2019 1700    BNP    Component Value Date/Time   BNP 42.9 01/08/2022 1305    ProBNP No results found for:  "PROBNP"  Imaging: No results found.        No data to display          No results found for: "NITRICOXIDE"      Assessment & Plan:   OSA (obstructive sleep apnea) Patient with known moderate sleep apnea diagnosed on in lab sleep study June 14, 2021, patient went for a CPAP titration study yesterday.  Results are pending.  Suspect patient has a component of OSA and OHS.  Will wait for titration study results before ordering sleep device.  Patient education given on sleep apnea.  We also went over different sleep devices such as CPAP and BiPAP with patient education given.  Plan  Patient Instructions  CT chest in 6 weeks follow up lung nodules   Once CPAP titration study results are available will start CPAP  Work on healthy weight loss  Do not drive if sleepy  Work on healthy sleep regimen  Follow up in 6-8 weeks with PFTs and As needed      Dyspnea Suspect is multifactorial in nature.  May have a component of deconditioning.  Needs further workup.  Previous CT chest did show multifocal pneumonia without follow-up imaging.  Also had some scattered lung nodules that will need serial follow-up.  Patient did have COVID-19 and x-ray during that ER visit did show some increased interstitial markings.  Will set up a CT chest in the next few weeks for further evaluation.  Have PFTs on return visit.  Plan  Patient Instructions  CT chest in 6 weeks follow up lung nodules   Once CPAP titration study results are available will start CPAP  Work on healthy weight loss  Do not drive if sleepy  Work on healthy sleep regimen  Follow up in 6-8 weeks with PFTs and As needed      Chronic diastolic CHF (congestive heart failure) (HCC) Appears euvolemic on exam.  No evidence of volume overload.  Previous echo in 2022  showed preserved EF.  Continue on current regimen  Morbid obesity with BMI of 50.0-59.9, adult [facility]) Patient is slowly been working on weight loss.  Continue with  healthy weight loss.    Rubye Oaks, NP 03/18/2022

## 2022-03-18 NOTE — Assessment & Plan Note (Signed)
Appears euvolemic on exam.  No evidence of volume overload.  Previous echo in 2022  showed preserved EF.  Continue on current regimen

## 2022-03-18 NOTE — Assessment & Plan Note (Signed)
Patient is slowly been working on weight loss.  Continue with healthy weight loss.

## 2022-03-18 NOTE — Patient Instructions (Addendum)
CT chest in 6 weeks follow up lung nodules   Once CPAP titration study results are available will start CPAP  Work on healthy weight loss  Do not drive if sleepy  Work on healthy sleep regimen  Follow up in 6-8 weeks with PFTs and As needed so you think the best

## 2022-03-20 DIAGNOSIS — G4733 Obstructive sleep apnea (adult) (pediatric): Secondary | ICD-10-CM

## 2022-03-20 NOTE — Procedures (Signed)
Patient Name: Christian [name], Christian [name] Date: 03/17/2022 Gender: Male D.O.B: 03-Oct-1975 Age (years): 47 Referring Provider: Dorna Mai Height (inches): 74 Interpreting Physician: Baird Lyons MD, ABSM Weight (lbs): 365 RPSGT: Jorge Ny BMI: 47 MRN: [id number] Neck Size: 20.50  CLINICAL INFORMATION The patient is referred for a CPAP titration to treat sleep apnea.  Date of NPSG, Split Night or HST:  NPSG 06/14/21  AHI 25.2/ hr, desaturation to 71%, body weight 410 lbs  SLEEP STUDY TECHNIQUE As per the AASM Manual for the Scoring of Sleep and Associated Events v2.3 (April 2016) with a hypopnea requiring 4% desaturations.  The channels recorded and monitored were frontal, central and occipital EEG, electrooculogram (EOG), submentalis EMG (chin), nasal and oral airflow, thoracic and abdominal wall motion, anterior tibialis EMG, snore microphone, electrocardiogram, and pulse oximetry. Continuous positive airway pressure (CPAP) was initiated at the beginning of the study and titrated to treat sleep-disordered breathing.  MEDICATIONS Medications self-administered by patient taken the night of the study : none reported  TECHNICIAN COMMENTS Comments added by technician: Patient had difficulty initiating sleep. Comments added by scorer: N/A RESPIRATORY PARAMETERS Optimal PAP Pressure (cm): 12 AHI at Optimal Pressure (/hr): 0.7 Overall Minimal O2 (%): 84.0 Supine % at Optimal Pressure (%): 100 Minimal O2 at Optimal Pressure (%): 87.0   SLEEP ARCHITECTURE The study was initiated at 9:57:31 PM and ended at 5:40:43 AM.  Sleep onset time was 35.0 minutes and the sleep efficiency was 84.6%. The total sleep time was 391.7 minutes.  The patient spent 4.1% of the night in stage N1 sleep, 75.9% in stage N2 sleep, 0.0% in stage N3 and 20% in REM.Stage REM latency was 102.5 minutes  Wake after sleep onset was 36.5. Alpha intrusion was absent. Supine sleep was 100.00%.  CARDIAC DATA The 2  lead EKG demonstrated sinus rhythm. The mean heart rate was 68.7 beats per minute. Other EKG findings include: None.  LEG MOVEMENT DATA The total Periodic Limb Movements of Sleep (PLMS) were 0. The PLMS index was 0.0. A PLMS index of <15 is considered normal in adults.  IMPRESSIONS - The optimal PAP pressure was 12 cm of water. - Central sleep apnea was not noted during this titration (CAI = 0.3/h). - Moderate oxygen desaturations were observed during this titration (min O2 = 84.0%). Supplemental O2 1L added per protocol.  - On CPAP 12 with 1L O2, minimum O2 saturation was 87% and Mean 89.2%. - The patient snored with soft snoring volume during this titration study. - No cardiac abnormalities were observed during this study. Occasional PAC. - Periodic Limb Movement. Total 548 (83.9/ hr). Limb movement with arousal 31 (4.7/ hr).  DIAGNOSIS - Obstructive Sleep Apnea (G47.33) - Periodic Limb Movement - Nocturnal Hypoxemia  RECOMMENDATIONS - Trial of CPAP therapy on 12 cm H2O or autopap 5-15. - Patient used a Large size Fisher[employer]kel Full Face Evora Full mask and heated humidification. - Supplemental O2 2L. Adjust as appropriate. - Be careful with alcohol, sedatives and other CNS depressants that may worsen sleep apnea and disrupt normal sleep architecture. - Sleep hygiene should be reviewed to assess factors that may improve sleep quality. - Consider treatment for limb movement sleep disturbance if appropriate. - Weight management and regular exercise should be initiated or continued.  [Electronically signed] 03/20/2022 12:19 PM  Baird Lyons MD, [place], [place] Board of Sleep Medicine NPI: [id number]  [place], Tax adviser of Sleep Medicine  ELECTRONICALLY SIGNED ON:  03/20/2022, 12:09 PM [place] PH: [phone]   FX: [phone] [place]

## 2022-03-25 ENCOUNTER — Telehealth: Payer: Self-pay | Admitting: Adult Health

## 2022-03-25 DIAGNOSIS — G4733 Obstructive sleep apnea (adult) (pediatric): Secondary | ICD-10-CM

## 2022-03-25 NOTE — Progress Notes (Signed)
Reviewed and agree with assessment/plan.   Floreine Kingdon, MD [place] [facility] 03/25/2022, 7:21 AM Pager:  [phone]  

## 2022-03-25 NOTE — Telephone Encounter (Signed)
Called and spoke with patient. He verbalized understanding is ok with the cpap order. Since he is already established with [employer], I will place the order with them and he is aware of this.   Order has been placed.   Nothing further needed at time of call.

## 2022-03-25 NOTE — Telephone Encounter (Signed)
CPAP titration study March 22, 2022 shows optimal control on CPAP Rec: Trial of CPAP therapy on 12 cm H2O with O2 2l/m  - Patient used a Large size [employer]kel Full Face Evora Full mask and heated humidification. - Supplemental O2 2L. Adjust as appropriate.  Need to get started on CPAP with oxygen as soon as possible

## 2022-03-27 DIAGNOSIS — H5213 Myopia, bilateral: Secondary | ICD-10-CM | POA: Diagnosis not present

## 2022-03-29 ENCOUNTER — Encounter (HOSPITAL_COMMUNITY): Payer: Self-pay | Admitting: Registered Nurse

## 2022-03-29 ENCOUNTER — Ambulatory Visit (HOSPITAL_COMMUNITY)
Admission: EM | Admit: 2022-03-29 | Discharge: 2022-03-29 | Disposition: A | Discharge status: Home / Self Care | Payer: Medicaid Other | Attending: Registered Nurse | Admitting: Registered Nurse

## 2022-03-29 DIAGNOSIS — Z59811 Housing instability, housed, with risk of homelessness: Secondary | ICD-10-CM | POA: Diagnosis present

## 2022-03-29 DIAGNOSIS — F331 Major depressive disorder, recurrent, moderate: Secondary | ICD-10-CM | POA: Diagnosis present

## 2022-03-29 DIAGNOSIS — Z754 Unavailability and inaccessibility of other helping agencies: Secondary | ICD-10-CM

## 2022-03-29 DIAGNOSIS — Z599 Problem related to housing and economic circumstances, unspecified: Secondary | ICD-10-CM

## 2022-03-29 NOTE — ED Provider Notes (Signed)
Behavioral Health Urgent Care Medical Screening Exam  Patient Name: Christian [name] MRN: [id number] Date of Evaluation: 03/29/22 Chief Complaint:   Diagnosis:  Final diagnoses:  MDD (major depressive disorder), recurrent episode, moderate ([place])  Inadequate community resources  Housing instability, currently housed, at risk for homelessness    History of Present illness: Christian [name] is a 47 y.o. male patient [facility] as a walk in with complaints of anxiety and needing resources for counseling  Christian [name], 47 y.o., male patient seen face to face by this provider, consulted with Dr. Hampton Abbot; and chart reviewed on 03/29/22.  On evaluation Christian [name] reports he came in today related to depression and anxiety. Patient reports he is looking for outpatient psychiatric services for counseling. Patient's face that he is currently living in a boarding house but has not been working and may be at risk for homelessness. Patient reports he has been trying to get disability since 2022 but has been denied twice. Report says landlord has been "very gracious but it is getting to the point to where I'm needing to pay rent, and I have to have a job, or I'll be homeless."  Page reports this stress with his cough worsening in his anxiety "sometimes when I lay down to go to sleep, I can't relax. I just feel like getting up and punching something." Patient reports that he had a interview with [employer] who was supposed to get back with him to see if he has been approved for ACTT services but he has not heard back from anyone. Reports he's also contacted [facility], but it doesn't appear that anything is moving forward.  Patient denies suicidal/self-harm/homicidal ideation, psychosis, and paranoia.  Reports he was started on Zoloft 2022 and is currently prescribed by his primary care provider.  Reports takes medication as ordered.   During evaluation Christian [name] is sitting  in chair with no noted distress.  He is alert/oriented x 4, calm, cooperative, attentive, and responses were relevant and appropriate to assessment questions.  He spoke in a clear tone at moderate volume, and normal pace, with good eye contact.   He denies suicidal/self-harm/homicidal ideation, psychosis, and paranoia.  Objectively:  there is no evidence of psychosis/mania or delusional thinking.  He conversed coherently, with goal directed thoughts, and no distractibility, or pre-occupation and he has denied suicidal/self-harm/homicidal ideation, psychosis, and paranoia.   At this time Christian [name] is educated and verbalizes understanding of mental health resources and other crisis services in the community. He is instructed to call 911 and present to the nearest emergency room should he experience any suicidal/homicidal ideation, auditory/visual/hallucinations, or detrimental worsening of his mental health condition.  He was a also advised by Probation officer that he could call the toll-free phone on back of Medicaid card to speak with care coordinator.  Patient was given psychiatric and community resources.     [place] ED from [facility]08/20[facility] in [facility] Department at [facility] ED to Hosp-Admission (Discharged) from 01/08/2021 in [place] ED from 12/09/2020 in [facility] Emergency Department at [place] No Risk No Risk No Risk       Psychiatric Specialty Exam  Presentation  General Appearance:Appropriate for Environment; Casual  Eye Contact:Good  Speech:Clear and Coherent; Normal Rate  Speech Volume:Normal  Handedness:Right   Mood and Affect  Mood: Dysphoric  Affect: Congruent   Thought Process  Thought Processes:  Coherent; Goal Directed  Descriptions of Associations:Intact  Orientation:Full (Time, Place and Person)  Thought Content:Logical    Hallucinations:None  Ideas of  Reference:None  Suicidal Thoughts:No  Homicidal Thoughts:No   Sensorium  Memory: Immediate Good; Recent Good; Remote Good  Judgment: Intact  Insight: Present   Executive Functions  Concentration: Good  Attention Span: Good  Recall: Good  Fund of Knowledge: Good  Language: Good   Psychomotor Activity  Psychomotor Activity: Normal   Assets  Assets: Communication Skills; Desire for Improvement; Housing; Physical Health; Social Support   Sleep  Sleep: Good  Number of hours: No data recorded  Physical Exam: Physical Exam Vitals and nursing note reviewed. Exam conducted with a chaperone present.  Constitutional:      General: He is not in acute distress.    Appearance: Normal appearance. He is not ill-appearing.  HENT:     Head: Normocephalic.  Eyes:     Conjunctiva/sclera: Conjunctivae normal.  Cardiovascular:     Rate and Rhythm: Normal rate and regular rhythm.  Pulmonary:     Effort: Pulmonary effort is normal.     Breath sounds: Normal breath sounds.  Musculoskeletal:        General: Normal range of motion.     Cervical back: Normal range of motion.  Skin:    General: Skin is warm and dry.  Neurological:     Mental Status: He is alert and oriented to person, place, and time.  Psychiatric:        Attention and Perception: Attention and perception normal. He does not perceive auditory or visual hallucinations.        Mood and Affect: Affect normal. Mood is anxious.        Speech: Speech normal.        Behavior: Behavior normal. Behavior is cooperative.        Thought Content: Thought content normal. Thought content is not paranoid or delusional. Thought content does not include homicidal or suicidal ideation.        Cognition and Memory: Cognition normal.        Judgment: Judgment normal.    Review of Systems  Psychiatric/Behavioral:  Depression: Stable. Hallucinations: Denies. Substance abuse: Denies. Suicidal ideas: Denies.  Nervous/anxious: Stable.        Reports some worsening of anxiety related to not having a job and needing to pay rent   All other systems reviewed and are negative.  Blood pressure 133/79, pulse 85, temperature 98 F (36.7 C), temperature source Oral, resp. rate 19, SpO2 96 %. There is no height or weight on file to calculate BMI.  Musculoskeletal: Strength & Muscle Tone: within normal limits Gait & Station: normal Patient leans: N/A   [place] MSE Discharge Disposition for Follow up and Recommendations: Based on my evaluation the patient does not appear to have an emergency medical condition and can be discharged with resources and follow up care in outpatient services for Medication Management and Individual Therapy   Discharge Instructions              [place]: Outpatient psychiatric Services  New Patient Assessment and Therapy Walk-in Monday thru Thursday 8:00 am first come first serve until slots are full Every Friday from 1:00 pm to 4:00 pm first come first serve until slots are full  New Patient Psychiatric Medication Management Monday thru Friday from 8:00 am to 11:00 am first come first served until slots are full  For all walk-ins we ask that you  arrive by 7:15 am because patients will be seen in there order of arrival.   Availability is limited, and therefore you may not be seen on the same day that you walk in.  Our goal is to serve and meet the needs of our community to the best of our ability.     [facility] Address: [address] [place], [place], [place] [address] Phone: [phone]  Supported Employment The supported employment program is a person-centered, individualized, evidence-based support service that helps members choose, acquire, and maintain competitive employment in our community. This service supports the varying needs of individuals and promotes community inclusion and employment success. Members enrolled in the  supported employment program can expect the following:  Development of an individual career plan Community based job placement Job shadowing Job development On-site job Furniture conservator/restorer and support  Supported Education Supported education helps our members receive the education and training they need to achieve their learning and recovery goals. This will assist members with becoming gainfully employed in the job or career of their choice. The program includes assistance with: Registering for disability accommodations Enrolling in school and registering for classes Learning communication skills Scheduling tutoring sessions within your school [facility] partners with Vocational Rehabilitation to help increase the success of clients seeking employment and educational goals.  Want to learn more about our programs?   Please contact our intake department INTAKE: [phone] Ext [phone]  Mailing: [address] [address]   [place], [place] [address]   [email or web address]             Substance Abuse Treatment Programs  Intensive Outpatient Programs [facility]     [address]      [place], [place]                   [phone]       [facility] [address] [place] [address] [place], [place] [phone]  [facility]     (Inpatient and outpatient)     [address]           [phone]    [facility] [phone] (Suboxone and Methadone)  [address]      [place], [place] [address]      [phone]       [address] [place], [place] [phone]  [facility] (Outpatient/Inpatient, [employer])    (insurance only) [phone]             [facility] (Groups & Residential) [place], [place] [phone]     [facility]     [address]     [place], [place]      [phone]       [facility] (for caregivers and family) [phone]  [address] [place], [place] [phone]      Residential Treatment Programs [facility]      [address], [place], [place] [address]  [phone]       [facility] [address] [place], [place] [address] [phone]  [facility] [place]        [phone]       [facility] [phone]  [facility] ([facility]             [address]  [place], [place]                                                [phone] or [phone]                               [facility] [place] [address] [place], [address] [phone]  [facility]    [address]      [place], [place]     [phone]       [facility] [address] [place], [place] [phone]  [facility]   [address] [place], [place] [address]     [phone]      Admissions: 8am-3pm M-F  [facility] (RT[facility]) [address] [place], [place] [phone]  [facility]: Residential Program [phone] Days)   [place], [place]      [phone] or [phone]     [facility]: [facility] [place], [place] (Walk in Hours over the weekend or by referral)  [facility] [address] [place], [place], [place] [address] [phone]  Crisis Mobile: [facility]:  [phone] (for crisis response 24 hours a day) [facility] Hotline:      [phone] Outpatient Psychiatry and Counseling  [facility]: Mobile Crisis Management 24 hours:  [phone]  [facility] [employer] sliding scale fee and walk in schedule: M-F 8am-12pm/1pm-3pm [address]  [place], [place] [address] [phone]  [facility] [address] [place], [place] [address] [phone]  [facility] ([facility] [employer])- new patient walk-in appointments available  Monday - Friday 8am -3pm.          [address] [place], [place] [address] [phone] or crisis line- [phone]  [facility] Outpatient Services/ Intensive Outpatient Therapy Program [address] [place], [place] [address] [phone]  [facility]                  Crisis Services      [phone] [address]     [place], [place] [address]                 [facility]   [facility] [phone]. [address] [place], [place] [address]   [facility]          [address]  [place], [place] [address]       [phone]  [facility] [address] [place], [place] [address] [phone]  [facility]    [address]    [place], [place] [address]     [phone]       Andee Poles, MD     [address]     [place] [address]     [phone]       [facility] [address] [place] [address]  [facility]     [address]     [place], [place]      [phone]       [facility] Eulogio Ditch, [place] [phone]  [place], [place] [address] [place]     [address]     [place], [place] [address]     [phone]       [facility] [address] [place], [place] [address] [phone] Resources for Temporary Residential Assistance/Crisis [place] [place]) M-F 8am-3pm   [address] [place], [place] [address]   [phone] Services include: laundry, barbering, support groups, case management, phone  & computer access, showers, AA/NA mtgs, mental health/substance abuse nurse, job skills class, disability information, VA assistance, spiritual classes, etc.   [facility] [place] [facility]   [address], [place]     [facility] (women and children)       [place]. [place], [place] [address] [phone] [email or web address] for application and process Application Required  [facility]   [address]    [place] [address]     [phone]                    [place], [place] [address] [phone](QQPYPPJK application appt.) Application Required  [facility] (women only)    [address]     [place], [place] [address]     [phone]      Intake starts 6pm daily Need valid ID, SSC, & Police report [facility] [address] [place], [place] [phone] Application Required  [employer] (men only)     [place].      [employer], [place]       [place] (Pregnant women only) [address] [place], [place]  [facility]      [place] [address]      [place], [place] [address]     [phone]             [facility] [address] [place], [place] 90 day commitment/SA/Application process  [facility](men only)     [address]     [place], [place]       Check-in at [facility] [address] [place], [place] [address] [phone] Men/Women/Women and Children must be there by 7 pm  [place], [place]                       Earleen [place], NP 03/29/2022, 1:06 PM

## 2022-03-29 NOTE — Discharge Instructions (Addendum)
 [facility]: Outpatient psychiatric Services  New Patient Assessment and Therapy Walk-in Monday thru Thursday 8:00 am first come first serve until slots are full Every Friday from 1:00 pm to 4:00 pm first come first serve until slots are full  New Patient Psychiatric Medication Management Monday thru Friday from 8:00 am to 11:00 am first come first served until slots are full  For all walk-ins we ask that you arrive by 7:15 am because patients will be seen in there order of arrival.   Availability is limited, and therefore you may not be seen on the same day that you walk in.  Our goal is to serve and meet the needs of our community to the best of our ability.    [facility] Address: [address], [place], [place] [address] Phone: [phone]  Supported Employment The supported employment program is a person-centered, individualized, evidence-based support service that helps members choose, acquire, and maintain competitive employment in our community. This service supports the varying needs of individuals and promotes community inclusion and employment success. Members enrolled in the supported employment program can expect the following:  Development of an individual career plan Community based job placement Job shadowing Job development On-site job training Educational goal planning and support  Supported Education Supported education helps our members receive the education and training they need to achieve their learning and recovery goals. This will assist members with becoming gainfully employed in the job or career of their choice. The program includes assistance with: Registering for disability accommodations Enrolling in school and registering for classes Learning communication skills Scheduling tutoring sessions within your school [facility] partners with [employer] [employer] to help increase the success of clients seeking employment  and educational goals.  Want to learn more about our programs?   Please contact our intake department INTAKE: [phone] Ext [phone]  Mailing: [address] [address]   [place], [place] [address]   [email or web address]     Substance Abuse Treatment Programs  Intensive Outpatient Programs [facility]     [address]      [place], [place]                   [phone]       [facility] [address] [place], [place] [phone]  [facility]     (Inpatient and outpatient)     [address]           [phone]    [facility] [phone] (Suboxone and Methadone)  [address]      [place], [place] [address]      [phone]       [address] [place], [place] [phone]  [facility] (Outpatient/Inpatient, [facility])    (insurance only) [phone]             [facility] (Groups & Residential) [place], [place] [phone]     [facility]     [address]     [place], [place]      [phone]       [facility] (for caregivers and family) [address] [place], [place] [phone]      Residential Treatment Programs [facility]      [address], [place], [place] [address]  [phone]       [facility] [address] [place],  [address] [phone]  [facility]          [phone]       [facility] [phone]  [facility]             [address]                                         [place], [place]                                                [phone] or [phone]                               [facility] [place] [address] [place], [address] [phone]  [facility]    [address]      [place], [place]     [phone]       [facility] [address] [place],  [place] [phone]  [facility]   [address]     [place], [place] [address]     [phone]      Admissions: 8am-3pm M-F  [facility] ([facility]) [address] [place], [place] [phone]  [facility]: Residential Program (90 Days)   [place], [place]      [phone] or [phone]     [facility]: [facility] [place], [place] (Walk in Hours over the weekend or by referral)  [place] [facility] [address], [place], [place] [address] [phone]  Crisis Mobile: [facility]:  1-877-626-1772 (for crisis response 24 hours a day) [facility] Hotline:      1-800-256-2452 Outpatient Psychiatry and Counseling  [facility]: Mobile Crisis Management 24 hours:  1-877-626-1772  [facility] sliding scale fee and walk in schedule: M-F 8am-12pm/1pm-3pm [address]  [place], [place] [address] [phone]  [facility] [address] [place], [place] [address] [phone]  [facility] ([facility] new patient walk-in appointments available Monday - Friday 8am -3pm.          [address] [place], [place] [address] [phone] or crisis line- [phone]  [facility]/ Intensive Outpatient Therapy Program [address] [place], [place] [address] [phone]  [facility]                  Crisis Services      [phone]      [address]     [place], [place] [address]                 [facility]   [facility] [phone] [address] [place], [place] [address]   [facility]          [address],  [place], [place] [address]       [phone]  [facility] [address] [place], [place] [address] [phone]  [facility]    [address]    ,  [address]     [phone]       Parish McKinney, MD       [address]     [place] [address]     [phone]       [facility] [address] [name] [address]  [facility]     [address]     [place], [place]      [phone]       [facility] Shamsher Ahluwalia, MD [address] [place], [place] [address] [phone]  [facility]     [address]     [place], [place] [address]     [phone]       [facility] [address] [place], [place] [address] [phone] Resources for Temporary Residential Assistance/Crisis Centers  [facility]) M-F 8am-3pm   [address] [place], [place] [address]   [phone] Services include: laundry, barbering, support groups, case management, phone  & computer access, showers, AA/NA mtgs, mental health/substance abuse nurse, job skills class, disability information, VA assistance, spiritual classes, etc.   [facility]OMELESS SHELTERS  [facility]     [name]   [address], [place]     [phone]              [facility] (women and children)       [address] [place], [place] [address] [phone] [email or web address] for application and process Application Required  [facility]   [address]    [place] [address]     [phone]                    [facility] [address] [place], [place] [address] [phone](schedule application appt.) Application Required  [facility] (women only)    [address]     [place], [place] [address]     [phone]      Intake starts 6pm daily Need valid ID, SSC, & Police report [facility] [place] [address] [place], [place] [phone] Application  Required  [facility] (men only)     [address]      [place], [place]     [phone]       [facility] (Pregnant women only) [address] [place], [place] [phone]  [facility]      [address]      [place], [place] [address]     [phone]             [place] [facility] [address] [place], [place] [phone] 90 day commitment/SA/Application process  [facility](men only)     [address]     [place], [place]     [phone]       Check-in at 7pm            [facility] [address] [place],  [address] [phone] Men/Women/Women and Children must be there by 7 pm  [facility] [place],  [phone]                 

## 2022-03-29 NOTE — Progress Notes (Signed)
Patient is a walk in, The patient stated that he is experiencing anxiety  depression  The patient is also scared that he will be evicted out of his house. The patient denies HI, SI at this moment. The patient denies street drugs. The patient could benefit from resources to help with job training and housing. This patient is routine. The patient is well groomed.

## 2022-03-29 NOTE — ED Notes (Signed)
Discharge instructions provided and Pt stated understanding. Pt alert, orient and ambulatory prior to d/c from facility. Personal belongings returned. Safety maintained.

## 2022-03-30 ENCOUNTER — Encounter (HOSPITAL_COMMUNITY): Payer: Self-pay | Admitting: Physician Assistant

## 2022-03-30 ENCOUNTER — Ambulatory Visit (INDEPENDENT_AMBULATORY_CARE_PROVIDER_SITE_OTHER): Payer: Medicaid Other | Admitting: Physician Assistant

## 2022-03-30 DIAGNOSIS — F331 Major depressive disorder, recurrent, moderate: Secondary | ICD-10-CM

## 2022-03-30 DIAGNOSIS — F411 Generalized anxiety disorder: Secondary | ICD-10-CM | POA: Insufficient documentation

## 2022-03-30 MED ORDER — SERTRALINE HCL 50 MG PO TABS: 150.0000 mg | ORAL_TABLET | Freq: Every day | ORAL | 1 refills | Status: DC

## 2022-03-30 NOTE — Progress Notes (Unsigned)
Psychiatric Initial Adult Assessment   Patient Identification: Christian [name] MRN:  [id number] Date of Evaluation:  03/30/2022 Referral Source: Referred by [facility] Chief Complaint:   Chief Complaint  Patient presents with   Establish Care   Medication Management   Visit Diagnosis:    ICD-10-CM   1. Moderate episode of recurrent major depressive disorder (HCC)  F33.1 sertraline (ZOLOFT) 50 MG tablet    2. Anxiety state  F41.1 sertraline (ZOLOFT) 50 MG tablet      History of Present Illness:  ***  Christian [name]  Associated Signs/Symptoms: Depression Symptoms:  depressed mood, anhedonia, hypersomnia, psychomotor agitation, psychomotor retardation, fatigue, feelings of worthlessness/guilt, difficulty concentrating, hopelessness, recurrent thoughts of death, suicidal thoughts without plan, anxiety, panic attacks, loss of energy/fatigue, disturbed sleep, Patient endorses self isolation (Hypo) Manic Symptoms:  Distractibility, Elevated Mood, Flight of Ideas, Community education officer, Grandiosity, Irritable Mood, Labiality of Mood, Anxiety Symptoms:  Excessive Worry, Panic Symptoms, Psychotic Symptoms:   Patient denies PTSD Symptoms: Had a traumatic exposure:  Patient reports that he has struggled with maintaining employment.  Patient also states that he has a history of evictions.  He states that his last place of residence was chaotic and in an unsafe part of town.  While living in this place of residence, patient states that he would keep to himself due to being scared of coming out of his room. Had a traumatic exposure in the last month:  N/A Re-experiencing:  Flashbacks Intrusive Thoughts Nightmares Hypervigilance:  No Hyperarousal:  Difficulty Concentrating Emotional Numbness/Detachment Increased Startle Response Avoidance:  Decreased Interest/Participation Foreshortened Future  Past Psychiatric History:  Patient  reports that when he was first hospitalized due to mental health, he was diagnosed with anxiety and depression.  Patient states that he was started on antidepressants during that time but never stayed consistent with his medication management  Previous Psychotropic Medications: Yes   Substance Abuse History in the last 12 months:  No.  Consequences of Substance Abuse: Negative  Past Medical History:  Past Medical History:  Diagnosis Date   Allergic rhinitis    Anxiety    CHF (congestive heart failure) (HCC)    Depression    Depression    Elevated blood pressure reading without diagnosis of hypertension    History of chicken pox    Obesity    Pleural effusion    Sepsis ([place])    Sleep apnea    O2 at night    Past Surgical History:  Procedure Laterality Date   COLON SURGERY     perforated bowel   TONSILLECTOMY AND ADENOIDECTOMY  03/01/1983    Family Psychiatric History:  Mother - patient reports that his mother had cancer when he was young and she was referred to a mental health facility Uncle (paternal) - possible schizophrenia, patient reports that his family suspected that his uncle may have been schizophrenic because he would see things  Family history of suicide: Patient denies, but states that his mother's, who was adopted, cousin attempted suicide.  Patient states that they were not blood related. Family history of homicide: Patient denies Family history of substance abuse: Patient denies  Family History:  Family History  Problem Relation Age of Onset   Stroke Mother    Diabetes Mother        pre-diabetes   Heart disease Mother        CHF, pacer   Cancer Mother        lymphoma  Stomach cancer Mother    Clotting disorder Mother    Coronary artery disease Father 47       MI   Hypertension Father    Hyperlipidemia Father    Diabetes Father    Heart attack Father    Migraines Sister    Stroke Paternal Grandmother    Diabetes Paternal Grandmother    Heart  disease Paternal Grandfather    Parkinson's disease Paternal Grandfather    Heart attack Paternal Grandfather     Social History:   Social History   Socioeconomic History   Marital status: Single    Spouse name: Not on file   Number of children: 0   Years of education: Not on file   Highest education level: Not on file  Occupational History   Not on file  Tobacco Use   Smoking status: Never   Smokeless tobacco: Never   Tobacco comments:    Living in homeless shelter  Vaping Use   Vaping Use: Never used  Substance and Sexual Activity   Alcohol use: Yes    Alcohol/week: 2.0 standard drinks of alcohol    Types: 2 Cans of beer per week    Comment: 1-2 beers 1-2 days a week   Drug use: Never   Sexual activity: Not Currently  Other Topics Concern   Not on file  Social History Narrative   Caffeine: occasional   Lives alone, near parents.     Occupation: works at [employer] part time   Edu: some college   Diet: some water, some fruits/vegetables, 1-2x/wk red meat, fish 1x/wk   Social Determinants of Radio broadcast assistant Strain: Not on file  Food Insecurity: No [place] (12/24/2020)   Hunger Vital Sign    Worried About Running Out of Food in the Last Year: Never true    [place] in the Last Year: Never true  Transportation Needs: No Transportation Needs (12/24/2020)   PRAPARE - Hydrologist (Medical): No    Lack of Transportation (Non-Medical): No  Physical Activity: Not on file  Stress: Not on file  Social Connections: Not on file    Additional Social History:  Patient endorses social support through his church pastor and his friends.  Patient denies having children of his own.  Patient endorses housing and is currently living in a boardinghouse.  Patient denies being employed.  Patient denies a past history of military experience.  Patient states that he has pleaded some college courses.  Patient denies having access  to weapons at this time.  Allergies:   Allergies  Allergen Reactions   Shellfish Allergy Nausea Only    Metabolic Disorder Labs: Lab Results  Component Value Date   HGBA1C 6.0 (H) 12/24/2020   MPG 125.5 10/09/2020   No results found for: "PROLACTIN" Lab Results  Component Value Date   CHOL 153 12/24/2020   TRIG 126 12/24/2020   HDL 42 12/24/2020   CHOLHDL 3.6 12/24/2020   LDLCALC 88 12/24/2020   Lab Results  Component Value Date   TSH 2.290 12/24/2020    Therapeutic Level Labs: No results found for: "LITHIUM" No results found for: "CBMZ" No results found for: "VALPROATE"  Current Medications: Current Outpatient Medications  Medication Sig Dispense Refill   fluticasone (FLONASE) 50 MCG/ACT nasal spray Spray 2 sprays into both nostrils once daily. 16 g 2   OXYGEN Inhale 2 L/min into the lungs at bedtime.     sertraline (  ZOLOFT) 50 MG tablet Take 3 tablets (150 mg total) by mouth daily. 90 tablet 1   torsemide (DEMADEX) 20 MG tablet Take 1 tablet (20 mg total) by mouth 2 (two) times daily. 180 tablet 1   vitamin B-12 (CYANOCOBALAMIN) 100 MCG tablet Take 1 tablet (100 mcg total) by mouth once daily. 30 tablet 0   Current Facility-Administered Medications  Medication Dose Route Frequency Provider Last Rate Last Admin   cholecalciferol (VITAMIN D3) tablet 1,000 Units  1,000 Units Oral Daily Regalado, Belkys A, MD        Musculoskeletal: Strength & Muscle Tone: within normal limits Gait & Station: normal Patient leans: N/A  Psychiatric Specialty Exam: Review of Systems  Psychiatric/Behavioral:  Positive for decreased concentration and sleep disturbance. Negative for dysphoric mood, hallucinations, self-injury and suicidal ideas. The patient is nervous/anxious. The patient is not hyperactive.     There were no vitals taken for this visit.There is no height or weight on file to calculate BMI.  General Appearance: Casual  Eye Contact:  Good  Speech:  Clear and  Coherent and Normal Rate  Volume:  Normal  Mood:  Anxious and Depressed  Affect:  Appropriate and Congruent  Thought Process:  Coherent, Goal Directed, and Descriptions of Associations: Intact  Orientation:  Full (Time, Place, and Person)  Thought Content:  Rumination and Tangential  Suicidal Thoughts:  No  Homicidal Thoughts:  No  Memory:  Immediate;   Good Recent;   Good Remote;   Fair  Judgement:  Fair  Insight:  Fair  Psychomotor Activity:  Normal  Concentration:  Concentration: Fair and Attention Span: Good  Recall:  Good  Fund of Knowledge:Fair  Language: Good  Akathisia:  No  Handed:  Left  AIMS (if indicated):  not done  Assets:  Communication Skills Desire for Improvement Housing Social Support  ADL's:  Intact  Cognition: WNL  Sleep:   Patient has a history of sleep apnea   Screenings: GAD-7    Flowsheet Row Office Visit from 03/30/2022 in [facility] Office Visit from 03/02/2022 in [facility] at [facility]  Total GAD-7 Score 14 0      PHQ2-9    [place] Visit from 03/30/2022 in [facility] Office Visit from 03/02/2022 in [place] [facility] [place] Visit from 01/17/2022 in [place] at [place] Visit from 10/13/2021 in [place] [facility] [place] Visit from 07/13/2021 in [place] at [facility]  PHQ-2 Total Score 2 0 2 2 1   PHQ-9 Total Score 14 0 5 6 4       [place] Office Visit from 03/30/2022 in [facility] ED from 01/08/2022 in [facility] Emergency Department at [facility] ED to Hosp-Admission (Discharged) from 01/08/2021 in [facility] High Risk No Risk No Risk       Assessment and Plan: ***    Collaboration of Care: Medication Management AEB provider managing patient's  psychiatric medications, Psychiatrist AEB patient being seen by a mental health provider, and Referral or follow-up with counselor/therapist AEB patient being set up with a licensed clinical social worker at this facility  Patient/Guardian was advised Release of Information must be obtained prior to any record release in order to collaborate their care with an outside provider. Patient/Guardian was advised if they have not already done so  to contact the registration department to sign all necessary forms in order for [place] to release information regarding their care.   Consent: Patient/Guardian gives verbal consent for treatment and assignment of benefits for services provided during this visit. Patient/Guardian expressed understanding and agreed to proceed.   Meta Hatchet, PA 1/31/202410:03 AM

## 2022-04-04 NOTE — Telephone Encounter (Signed)
Copied from [place]. Topic: Referral - Question >> Apr 01, 2022 12:26 PM Rosanne Ashing P wrote: Reason for CRM: pt called saying he is having popping in his left knee.  He said he mentioned it to Dr. Redmond Pulling and wants to know if she can refer him to ortho  CB#  [phone]

## 2022-04-11 DIAGNOSIS — G4733 Obstructive sleep apnea (adult) (pediatric): Secondary | ICD-10-CM | POA: Diagnosis not present

## 2022-04-11 NOTE — Telephone Encounter (Signed)
Patient call and given appt

## 2022-04-29 ENCOUNTER — Ambulatory Visit (HOSPITAL_COMMUNITY)
Admission: RE | Admit: 2022-04-29 | Discharge: 2022-04-29 | Disposition: A | Discharge status: Home / Self Care | Payer: Medicaid Other | Source: Ambulatory Visit | Attending: Adult Health | Admitting: Adult Health

## 2022-04-29 DIAGNOSIS — R918 Other nonspecific abnormal finding of lung field: Secondary | ICD-10-CM | POA: Diagnosis not present

## 2022-04-29 DIAGNOSIS — G4733 Obstructive sleep apnea (adult) (pediatric): Secondary | ICD-10-CM | POA: Diagnosis not present

## 2022-05-05 ENCOUNTER — Ambulatory Visit (INDEPENDENT_AMBULATORY_CARE_PROVIDER_SITE_OTHER): Payer: Medicaid Other | Admitting: Adult Health

## 2022-05-05 ENCOUNTER — Encounter: Payer: Self-pay | Admitting: Adult Health

## 2022-05-05 VITALS — BP 106/80 | HR 96 | Temp 98.1°F | Ht 74.0 in | Wt 365.0 lb

## 2022-05-05 DIAGNOSIS — R918 Other nonspecific abnormal finding of lung field: Secondary | ICD-10-CM | POA: Insufficient documentation

## 2022-05-05 DIAGNOSIS — I5032 Chronic diastolic (congestive) heart failure: Secondary | ICD-10-CM

## 2022-05-05 DIAGNOSIS — G4733 Obstructive sleep apnea (adult) (pediatric): Secondary | ICD-10-CM

## 2022-05-05 DIAGNOSIS — R0609 Other forms of dyspnea: Secondary | ICD-10-CM | POA: Diagnosis not present

## 2022-05-05 DIAGNOSIS — E662 Morbid (severe) obesity with alveolar hypoventilation: Secondary | ICD-10-CM

## 2022-05-05 DIAGNOSIS — G4734 Idiopathic sleep related nonobstructive alveolar hypoventilation: Secondary | ICD-10-CM

## 2022-05-05 NOTE — Patient Instructions (Addendum)
Keep up the good work Continue on CPAP at bedtime with oxygen. Work on healthy weight loss Do not drive if sleepy Activity as tolerated.  CT chest in 1 year to follow lung nodules  Refer to cardiology .  Albuterol inhaler 1-2 puffs every 4-6hr as needed .  Follow up with in 3 months with PFT  with Dr. Ander Slade or Ronda Rajkumar NP and As needed

## 2022-05-05 NOTE — Assessment & Plan Note (Signed)
Dyspnea suspect is multifactorial.  Has underlying diastolic heart failure.  Suspect a component of deconditioning.  Will refer to cardiology.  Check PFTs on return.

## 2022-05-05 NOTE — Progress Notes (Signed)
$'@Patient'E$  ID: Christian [name], male    DOB: October 10, 1975, 47 y.o.   MRN: [id number]  Chief Complaint  Patient presents with   Follow-up    Referring provider: Dorna Mai, MD  HPI: 47 year old male never smoker seen for sleep consult March 18, 2022 to establish for moderate obstructive sleep apnea Medical history significant for diastolic heart failure, recurrent hospitalization in 2022 with a perforated bowel status post resection, acute respiratory failure, influenza, sepsis, decubitus ulcer discharged on oxygen felt to have underlying OSA and OHS, morbid obesity with BMI at 47  TEST/EVENTS :  NPSG June 14, 2021 showed moderate sleep apnea with AHI at 25.2/hour, AHI during REM was 82/hour, SpO2 low 71%  CPAP titration study March 22, 2022 shows optimal control on CPAP Rec: Trial of CPAP therapy on 12 cm H2O with O2 2l/m  - Patient used a Large size [employer] Full Face Evora Full mask and heated humidification. - Supplemental O2 2L. Adjust as appropriate.  05/05/2022 Follow up ; OSA  Patient presents for a 6-week follow-up.  Patient was seen last visit for a sleep consult to establish for sleep apnea.  Patient underwent an in lab sleep study in April 2023 that showed moderate obstructive sleep apnea with AHI 25.2/hour with an AHI during REM sleep at 82/hour with SpO2 low at 71%.  He was set up for CPAP titration study that was done on March 22, 2022 that showed optimal control on CPAP 12 cm H2O.  With oxygen at 2 L.  Patient was recommended started on CPAP therapy with oxygen.  Patient says he is doing well on CPAP.  Tries to wears a CPAP every single night.  Usually gets in between 8 to 9 hours.  Patient says he is feeling better with decreased daytime sleepiness.  Feels he is sleeping on more regular routine.  He does wear his CPAP with oxygen.  CPAP download shows excellent compliance with 100% usage daily average usage at 9 hours.  AHI 1.4/hour patient is using a full face  mask. Has the MyAir APP on Phone.  Last visit patient was complaining of shortness of breath with activities.  No previous diagnosis of asthma or COPD.  He is a never smoker.  He was set up for PFTs that are pending.  Patient did have severe pneumonia with prolonged hospitalization in 2022.  Also had COVID-19 in November 2023.  He was set up for CT chest that was completed April 29, 2022 that showed significant improvement with near complete resolution of pulmonary infiltrates noted previously.  There is a faint clusters of groundglass in the right lung and several scattered pulmonary nodules measuring up to 4 mm.  We discussed his CT results in detail.  He has no acute symptoms.  No cough or congestion.  He is a never smoker.  We discussed a follow-up surveillance CT in 1 year.   Patient has a history of diastolic heart failure.  Family history of coronary disease.  Requested referral to cardiology.  No current chest pain.  Has housing instability. Previously homeless. Uses bus for transportation and Medicaid medical transport.  Patient has recently been trying to get a job.    Allergies  Allergen Reactions   Shellfish Allergy Nausea Only    Immunization History  Administered Date(s) Administered   Influenza Split 11/30/2011   Influenza,inj,Quad PF,6+ Mos 11/07/2020    Past Medical History:  Diagnosis Date   Allergic rhinitis    Anxiety    CHF (congestive  heart failure) (HCC)    Depression    Depression    Elevated blood pressure reading without diagnosis of hypertension    History of chicken pox    Obesity    Pleural effusion    Sepsis ([place])    Sleep apnea    O2 at night    Tobacco History: Social History   Tobacco Use  Smoking Status Never  Smokeless Tobacco Never  Tobacco Comments   Living in homeless shelter   Counseling given: Not Answered Tobacco comments: Living in homeless shelter   Outpatient Medications Prior to Visit  Medication Sig Dispense Refill    fluticasone (FLONASE) 50 MCG/ACT nasal spray Spray 2 sprays into both nostrils once daily. 16 g 2   OXYGEN Inhale 2 L/min into the lungs at bedtime.     sertraline (ZOLOFT) 50 MG tablet Take 3 tablets (150 mg total) by mouth daily. 90 tablet 1   torsemide (DEMADEX) 20 MG tablet Take 1 tablet (20 mg total) by mouth 2 (two) times daily. 180 tablet 1   vitamin B-12 (CYANOCOBALAMIN) 100 MCG tablet Take 1 tablet (100 mcg total) by mouth once daily. 30 tablet 0   Facility-Administered Medications Prior to Visit  Medication Dose Route Frequency Provider Last Rate Last Admin   cholecalciferol (VITAMIN D3) tablet 1,000 Units  1,000 Units Oral Daily Regalado, Belkys A, MD         Review of Systems:   Constitutional:   No  weight loss, night sweats,  Fevers, chills,  +fatigue, or  lassitude.  HEENT:   No headaches,  Difficulty swallowing,  Tooth/dental problems, or  Sore throat,                No sneezing, itching, ear ache, nasal congestion, post nasal drip,   CV:  No chest pain,  Orthopnea, PND, swelling in lower extremities, anasarca, dizziness, palpitations, syncope.   GI  No heartburn, indigestion, abdominal pain, nausea, vomiting, diarrhea, change in bowel habits, loss of appetite, bloody stools.   Resp: He might could use a no excess mucus, no productive cough,  No non-productive cough,  No coughing up of blood.  No change in color of mucus.  No wheezing.  No chest wall deformity  Skin: no rash or lesions.  GU: no dysuria, change in color of urine, no urgency or frequency.  No flank pain, no hematuria   MS:  No joint pain or swelling.  No decreased range of motion.  No back pain.    Physical Exam  BP 106/80 (BP Location: Left Arm, Patient Position: Sitting, Cuff Size: Large)   Pulse 96   Temp 98.1 F (36.7 C) (Oral)   Ht '6\' 2"'$  (1.88 m)   Wt (!) 365 lb (165.6 kg)   SpO2 96%   BMI 46.86 kg/m   GEN: A/Ox3; pleasant , NAD, well nourished   EACs-clear, TMs-wnl, NOSE-clear,  THROAT-clear, no lesions, no postnasal drip or exudate noted.  Class 3-4 MP airway   NECK:  Supple w/ fair ROM; no JVD; normal carotid impulses w/o bruits; no thyromegaly or nodules palpated; no lymphadenopathy.    RESP  Clear  P & A; w/o, wheezes/ rales/ or rhonchi. no accessory muscle use, no dullness to percussion  CARD:  RRR, no m/r/g, no peripheral edema, pulses intact, no cyanosis or clubbing.  GI:   Soft & nt; nml bowel sounds; no organomegaly or masses detected.   Musco: Warm bil, no deformities or joint swelling noted.   Neuro:  alert, no focal deficits noted.    Skin: Warm, no lesions or rashes    Lab Results:  CBC    Component Value Date/Time   WBC 6.5 01/08/2022 1305   RBC 5.19 01/08/2022 1305   HGB 14.0 01/08/2022 1305   HGB 14.4 03/26/2021 0903   HCT 42.8 01/08/2022 1305   HCT 44.1 03/26/2021 0903   PLT 300 01/08/2022 1305   PLT 354 03/26/2021 0903   MCV 82.5 01/08/2022 1305   MCV 78 (L) 03/26/2021 0903   MCH 27.0 01/08/2022 1305   MCHC 32.7 01/08/2022 1305   RDW 13.4 01/08/2022 1305   RDW 14.7 03/26/2021 0903   LYMPHSABS 0.8 01/08/2022 1305   LYMPHSABS 1.7 03/26/2021 0903   MONOABS 1.3 (H) 01/08/2022 1305   EOSABS 0.0 01/08/2022 1305   EOSABS 0.4 03/26/2021 0903   BASOSABS 0.1 01/08/2022 1305   BASOSABS 0.1 03/26/2021 0903    BMET    Component Value Date/Time   NA 135 01/08/2022 1305   NA 143 03/26/2021 0903   K 3.4 (L) 01/08/2022 1305   CL 100 01/08/2022 1305   CO2 24 01/08/2022 1305   GLUCOSE 108 (H) 01/08/2022 1305   BUN 10 01/08/2022 1305   BUN 14 03/26/2021 0903   CREATININE 1.13 01/08/2022 1305   CALCIUM 8.6 (L) 01/08/2022 1305   GFRNONAA >60 01/08/2022 1305   GFRAA >60 09/23/2019 1700    BNP    Component Value Date/Time   BNP 42.9 01/08/2022 1305    ProBNP No results found for: "PROBNP"  Imaging: CT Chest Wo Contrast  Result Date: 05/01/2022 CLINICAL DATA:  Follow-up pulmonary nodule. EXAM: CT CHEST WITHOUT CONTRAST  TECHNIQUE: Multidetector CT imaging of the chest was performed following the standard protocol without IV contrast. RADIATION DOSE REDUCTION: This exam was performed according to the departmental dose-optimization program which includes automated exposure control, adjustment of the mA and/or kV according to patient size and/or use of iterative reconstruction technique. COMPARISON:  Chest CT dated 01/09/2021. FINDINGS: Evaluation of this exam is limited in the absence of intravenous contrast. Cardiovascular: There is no cardiomegaly or pericardial effusion. The thoracic aorta and central pulmonary arteries are grossly patent. Mediastinum/Nodes: No hilar or mediastinal adenopathy. The esophagus is grossly unremarkable. No mediastinal fluid collection. Lungs/Pleura: Significant improvement with near complete resolution of pulmonary infiltrate seen on the prior CT. Faint small clusters of ground-glass density predominantly involving the right lung suspicious for developing atypical infection. Clinical correlation is recommended. No focal consolidation, pleural effusion or pneumothorax. Several tiny nodules measure up to 3-4 mm, likely post inflammatory. There is a 12 mm nodular density at the right lung base anteriorly, likely scarring. The central airways are patent. Upper Abdomen: No acute abnormality. Musculoskeletal: Mild degenerative changes. No acute osseous pathology. IMPRESSION: 1. Significant improvement with near complete resolution of pulmonary infiltrate seen on the prior CT. Faint small clusters of ground-glass density predominantly involving the right lung suspicious for developing atypical infection. 2. Several nodules measure up to 4 mm, likely post inflammatory. No follow-up needed if patient is low-risk (and has no known or suspected primary neoplasm). Non-contrast chest CT can be considered in 12 months if patient is high-risk. This recommendation follows the consensus statement: Guidelines for  Management of Incidental Pulmonary Nodules Detected on CT Images: From the Fleischner Society 2017; Radiology 2017; 284:228-243. Electronically Signed   By: Anner Crete M.D.   On: 05/01/2022 21:28          No data to display  No results found for: "NITRICOXIDE"      Assessment & Plan:   OSA (obstructive sleep apnea) Excellent compliance and control on CPAP with O2   Plan Patient Instructions  Keep up the good work Continue on CPAP at bedtime with oxygen. Work on healthy weight loss Do not drive if sleepy     Nocturnal hypoxia Continue on Oxygen 2l/m At bedtime  with CPAP .   Obesity hypoventilation syndrome ([place]) Encouraged on weight loss  Continue on CPAP with Oxygen   Plan Patient Instructions  Keep up the good work Continue on CPAP at bedtime with oxygen. Work on healthy weight loss Do not drive if sleepy     Chronic diastolic CHF (congestive heart failure) (HCC) Appears euvolemic on exam.  Continue current regimen.  Referral to cardiology for further follow-up per request  Dyspnea Dyspnea suspect is multifactorial.  Has underlying diastolic heart failure.  Suspect a component of deconditioning.  Will refer to cardiology.  Check PFTs on return.  Lung nodules Scattered pulmonary nodules-measuring up to 4 mm.  Patient has no acute symptoms.  Some groundglass in the right lung.  Will continue to follow.  Surveillance CT chest in 1 year     Rexene Edison, NP 05/05/2022

## 2022-05-05 NOTE — Assessment & Plan Note (Signed)
Encouraged on weight loss  Continue on CPAP with Oxygen   Plan Patient Instructions  Keep up the good work Continue on CPAP at bedtime with oxygen. Work on healthy weight loss Do not drive if sleepy

## 2022-05-05 NOTE — Assessment & Plan Note (Addendum)
Appears euvolemic on exam.  Continue current regimen.  Referral to cardiology for further follow-up per request

## 2022-05-05 NOTE — Assessment & Plan Note (Signed)
Continue on Oxygen 2l/m At bedtime  with CPAP .

## 2022-05-05 NOTE — Assessment & Plan Note (Signed)
Excellent compliance and control on CPAP with O2   Plan Patient Instructions  Keep up the good work Continue on CPAP at bedtime with oxygen. Work on healthy weight loss Do not drive if sleepy

## 2022-05-05 NOTE — Assessment & Plan Note (Signed)
Scattered pulmonary nodules-measuring up to 4 mm.  Patient has no acute symptoms.  Some groundglass in the right lung.  Will continue to follow.  Surveillance CT chest in 1 year

## 2022-05-06 ENCOUNTER — Telehealth: Payer: Self-pay | Admitting: Adult Health

## 2022-05-06 ENCOUNTER — Encounter: Payer: Self-pay | Admitting: Family Medicine

## 2022-05-06 ENCOUNTER — Ambulatory Visit (INDEPENDENT_AMBULATORY_CARE_PROVIDER_SITE_OTHER): Payer: Medicaid Other | Admitting: Family Medicine

## 2022-05-06 VITALS — BP 126/84 | HR 72 | Temp 98.1°F | Resp 16 | Wt 362.6 lb

## 2022-05-06 DIAGNOSIS — M25561 Pain in right knee: Secondary | ICD-10-CM | POA: Diagnosis not present

## 2022-05-06 DIAGNOSIS — M25562 Pain in left knee: Secondary | ICD-10-CM | POA: Diagnosis not present

## 2022-05-06 MED ORDER — ALBUTEROL SULFATE HFA 108 (90 BASE) MCG/ACT IN AERS
2.0000 | INHALATION_SPRAY | Freq: Four times a day (QID) | RESPIRATORY_TRACT | 3 refills | Status: DC | PRN
  Filled 2023-03-17 – 2023-04-12 (×2): qty 18, 25d supply, fill #0

## 2022-05-06 MED ORDER — MELOXICAM 15 MG PO TABS: 15.0000 mg | ORAL_TABLET | Freq: Every day | ORAL | 2 refills | Status: DC

## 2022-05-06 NOTE — Progress Notes (Unsigned)
Patient would like a referral for orth. Patient is having trouble with both knees

## 2022-05-06 NOTE — Telephone Encounter (Signed)
Pt calling in in to get a script for albuterol

## 2022-05-06 NOTE — Progress Notes (Unsigned)
New Patient Office Visit  Subjective    Patient ID: Christian [name], male    DOB: [birth year]  Age: 47 y.o. MRN: [id number]  CC:  Chief Complaint  Patient presents with   referral    HPI Christian [name] presents to establish care ***  Outpatient Encounter Medications as of 05/06/2022  Medication Sig   fluticasone (FLONASE) 50 MCG/ACT nasal spray Spray 2 sprays into both nostrils once daily.   OXYGEN Inhale 2 L/min into the lungs at bedtime.   sertraline (ZOLOFT) 50 MG tablet Take 3 tablets (150 mg total) by mouth daily.   torsemide (DEMADEX) 20 MG tablet Take 1 tablet (20 mg total) by mouth 2 (two) times daily.   vitamin B-12 (CYANOCOBALAMIN) 100 MCG tablet Take 1 tablet (100 mcg total) by mouth once daily.   Facility-Administered Encounter Medications as of 05/06/2022  Medication   cholecalciferol (VITAMIN D3) tablet 1,000 Units    Past Medical History:  Diagnosis Date   Allergic rhinitis    Anxiety    CHF (congestive heart failure) (HCC)    Depression    Depression    Elevated blood pressure reading without diagnosis of hypertension    History of chicken pox    Obesity    Pleural effusion    Sepsis ([place])    Sleep apnea    O2 at night    Past Surgical History:  Procedure Laterality Date   COLON SURGERY     perforated bowel   TONSILLECTOMY AND ADENOIDECTOMY  03/01/1983    Family History  Problem Relation Age of Onset   Stroke Mother    Diabetes Mother        pre-diabetes   Heart disease Mother        CHF, pacer   Cancer Mother        lymphoma   Stomach cancer Mother    Clotting disorder Mother    Coronary artery disease Father 45       MI   Hypertension Father    Hyperlipidemia Father    Diabetes Father    Heart attack Father    Migraines Sister    Stroke Paternal Grandmother    Diabetes Paternal Grandmother    Heart disease Paternal Grandfather    Parkinson's disease Paternal Grandfather    Heart attack Paternal Grandfather     Social  History   Socioeconomic History   Marital status: Single    Spouse name: Not on file   Number of children: 0   Years of education: Not on file   Highest education level: Not on file  Occupational History   Not on file  Tobacco Use   Smoking status: Never   Smokeless tobacco: Never   Tobacco comments:    Living in homeless shelter  Vaping Use   Vaping Use: Never used  Substance and Sexual Activity   Alcohol use: Yes    Alcohol/week: 2.0 standard drinks of alcohol    Types: 2 Cans of beer per week    Comment: 1-2 beers 1-2 days a week   Drug use: Never   Sexual activity: Not Currently  Other Topics Concern   Not on file  Social History Narrative   Caffeine: occasional   Lives alone, near parents.     Occupation: works at [employer] part time   Edu: some college   Diet: some water, some fruits/vegetables, 1-2x/wk red meat, fish 1x/wk   Social Determinants of Radio broadcast assistant Strain: Not  on file  Food Insecurity: No Food Insecurity (12/24/2020)   Hunger Vital Sign    Worried About Running Out of Food in the Last Year: Never true    Ran Out of Food in the Last Year: Never true  Transportation Needs: No Transportation Needs (12/24/2020)   PRAPARE - Hydrologist (Medical): No    Lack of Transportation (Non-Medical): No  Physical Activity: Not on file  Stress: Not on file  Social Connections: Not on file  Intimate Partner Violence: Not on file    ROS      Objective    BP 126/84   Pulse 72   Temp 98.1 F (36.7 C) (Oral)   Resp 16   Wt (!) 362 lb 9.6 oz (164.5 kg)   SpO2 93%   BMI 46.56 kg/m   Physical Exam  {Labs (Optional):23779}    Assessment & Plan:   Problem List Items Addressed This Visit   None Visit Diagnoses     Pain in both knees, unspecified chronicity    -  Primary       No follow-ups on file.   Becky Sax, MD

## 2022-05-06 NOTE — Telephone Encounter (Signed)
Called and spoke with pt to verify which pharmacy he needed to have the albuterol inhaler sent to and have sent this in for pt. Nothing further needed.

## 2022-05-09 ENCOUNTER — Encounter: Payer: Self-pay | Admitting: Family Medicine

## 2022-05-10 ENCOUNTER — Encounter (HOSPITAL_COMMUNITY): Payer: Self-pay | Admitting: Physician Assistant

## 2022-05-10 ENCOUNTER — Ambulatory Visit (INDEPENDENT_AMBULATORY_CARE_PROVIDER_SITE_OTHER): Payer: Medicaid Other | Admitting: Physician Assistant

## 2022-05-10 DIAGNOSIS — F331 Major depressive disorder, recurrent, moderate: Secondary | ICD-10-CM | POA: Diagnosis not present

## 2022-05-10 DIAGNOSIS — G4733 Obstructive sleep apnea (adult) (pediatric): Secondary | ICD-10-CM | POA: Diagnosis not present

## 2022-05-10 DIAGNOSIS — F411 Generalized anxiety disorder: Secondary | ICD-10-CM

## 2022-05-10 MED ORDER — SERTRALINE HCL 50 MG PO TABS: 150.0000 mg | ORAL_TABLET | Freq: Every day | ORAL | 2 refills | Status: DC

## 2022-05-10 NOTE — Progress Notes (Unsigned)
[place] MD/PA/NP OP Progress Note  05/10/2022 5:23 PM Christian [name]  MRN:  [id number]  Chief Complaint:  Chief Complaint  Patient presents with   Follow-up   Medication Refill   HPI: ***  Christian [name]. Christian [name]  Visit Diagnosis:    ICD-10-CM   1. Anxiety state  F41.1 sertraline (ZOLOFT) 50 MG tablet    2. Moderate episode of recurrent major depressive disorder (HCC)  F33.1 sertraline (ZOLOFT) 50 MG tablet      Past Psychiatric History:  Patient reports that when he was first hospitalized due to mental health, he was diagnosed with anxiety and depression. Patient states that he was started on antidepressants during that time but never stayed consistent with his medication management   Past Medical History:  Past Medical History:  Diagnosis Date   Allergic rhinitis    Anxiety    CHF (congestive heart failure) (HCC)    Depression    Depression    Elevated blood pressure reading without diagnosis of hypertension    History of chicken pox    Obesity    Pleural effusion    Sepsis ([place])    Sleep apnea    O2 at night    Past Surgical History:  Procedure Laterality Date   COLON SURGERY     perforated bowel   TONSILLECTOMY AND ADENOIDECTOMY  03/01/1983    Family Psychiatric History:  Mother - patient reports that his mother had cancer when he was young and she was referred to a mental health facility Uncle (paternal) - possible schizophrenia, patient reports that his family suspected that his uncle may have been schizophrenic because he would see things   Family history of suicide: Patient denies, but states that his mother's, who was adopted, cousin attempted suicide.  Patient states that they were not blood related. Family history of homicide: Patient denies Family history of substance abuse: Patient denies  Family History:  Family History  Problem Relation Age of Onset   Stroke Mother    Diabetes Mother        pre-diabetes   Heart disease Mother        CHF, pacer    Cancer Mother        lymphoma   Stomach cancer Mother    Clotting disorder Mother    Coronary artery disease Father 71       MI   Hypertension Father    Hyperlipidemia Father    Diabetes Father    Heart attack Father    Migraines Sister    Stroke Paternal Grandmother    Diabetes Paternal Grandmother    Heart disease Paternal Grandfather    Parkinson's disease Paternal Grandfather    Heart attack Paternal Grandfather     Social History:  Social History   Socioeconomic History   Marital status: Single    Spouse name: Not on file   Number of children: 0   Years of education: Not on file   Highest education level: Not on file  Occupational History   Not on file  Tobacco Use   Smoking status: Never   Smokeless tobacco: Never   Tobacco comments:    Living in homeless shelter  Vaping Use   Vaping Use: Never used  Substance and Sexual Activity   Alcohol use: Yes    Alcohol/week: 2.0 standard drinks of alcohol    Types: 2 Cans of beer per week    Comment: 1-2 beers 1-2 days a week   Drug use: Never  Sexual activity: Not Currently  Other Topics Concern   Not on file  Social History Narrative   Caffeine: occasional   Lives alone, near parents.     Occupation: works at [employer] part time   Edu: some college   Diet: some water, some fruits/vegetables, 1-2x/wk red meat, fish 1x/wk   Social Determinants of Radio broadcast assistant Strain: Not on file  Food Insecurity: No [place] (12/24/2020)   Hunger Vital Sign    Worried About Running Out of Food in the Last Year: Never true    [place] in the Last Year: Never true  Transportation Needs: No Transportation Needs (12/24/2020)   PRAPARE - Hydrologist (Medical): No    Lack of Transportation (Non-Medical): No  Physical Activity: Not on file  Stress: Not on file  Social Connections: Not on file    Allergies:  Allergies  Allergen Reactions   Shellfish Allergy  Nausea Only    Metabolic Disorder Labs: Lab Results  Component Value Date   HGBA1C 6.0 (H) 12/24/2020   MPG 125.5 10/09/2020   No results found for: "PROLACTIN" Lab Results  Component Value Date   CHOL 153 12/24/2020   TRIG 126 12/24/2020   HDL 42 12/24/2020   CHOLHDL 3.6 12/24/2020   LDLCALC 88 12/24/2020   Lab Results  Component Value Date   TSH 2.290 12/24/2020   TSH 2.650 10/09/2020    Therapeutic Level Labs: No results found for: "LITHIUM" No results found for: "VALPROATE" No results found for: "CBMZ"  Current Medications: Current Outpatient Medications  Medication Sig Dispense Refill   albuterol (VENTOLIN HFA) 108 (90 Base) MCG/ACT inhaler Inhale 2 puffs into the lungs every 6 (six) hours as needed for wheezing or shortness of breath. 8 g 3   fluticasone (FLONASE) 50 MCG/ACT nasal spray Spray 2 sprays into both nostrils once daily. 16 g 2   meloxicam (MOBIC) 15 MG tablet Take 1 tablet (15 mg total) by mouth daily. 30 tablet 2   OXYGEN Inhale 2 L/min into the lungs at bedtime.     sertraline (ZOLOFT) 50 MG tablet Take 3 tablets (150 mg total) by mouth daily. 90 tablet 2   torsemide (DEMADEX) 20 MG tablet Take 1 tablet (20 mg total) by mouth 2 (two) times daily. 180 tablet 1   vitamin B-12 (CYANOCOBALAMIN) 100 MCG tablet Take 1 tablet (100 mcg total) by mouth once daily. 30 tablet 0   Current Facility-Administered Medications  Medication Dose Route Frequency Provider Last Rate Last Admin   cholecalciferol (VITAMIN D3) tablet 1,000 Units  1,000 Units Oral Daily Regalado, Belkys A, MD         Musculoskeletal: Strength & Muscle Tone: within normal limits Gait & Station: normal Patient leans: N/A  Psychiatric Specialty Exam: Review of Systems  Psychiatric/Behavioral:  Negative for decreased concentration, dysphoric mood, hallucinations, self-injury, sleep disturbance and suicidal ideas. The patient is nervous/anxious. The patient is not hyperactive.     Blood  pressure 131/78, pulse 67, temperature 98.2 F (36.8 C), temperature source Oral, height '6\' 2"'$  (1.88 m), weight (!) 370 lb (167.8 kg), SpO2 95 %.Body mass index is 47.51 kg/m.  General Appearance: Casual  Eye Contact:  Good  Speech:  Clear and Coherent and Normal Rate  Volume:  Normal  Mood:  Anxious and Depressed  Affect:  Appropriate  Thought Process:  Coherent, Goal Directed, and Descriptions of Associations: Intact  Orientation:  Full (Time, Place, and  Person)  Thought Content: WDL   Suicidal Thoughts:  No  Homicidal Thoughts:  No  Memory:  Immediate;   Good Recent;   Good Remote;   Fair  Judgement:  Fair  Insight:  Fair  Psychomotor Activity:  Normal  Concentration:  Concentration: Good and Attention Span: Good  Recall:  Good  Fund of Knowledge: Fair  Language: Good  Akathisia:  No  Handed:  Left  AIMS (if indicated): not done  Assets:  Communication Skills Desire for Improvement Housing Social Support  ADL's:  Intact  Cognition: WNL  Sleep:   Patient has a history of sleep apnea patient received on average 7 to 8 hours of sleep each night.   Screenings: GAD-7    Flowsheet Row Clinical Support from 05/10/2022 in [facility] Office Visit from 05/06/2022 in [facility] [place] Visit from 03/30/2022 in [facility] Office Visit from 03/02/2022 in [facility]  Total GAD-7 Score 16 0 14 0      PHQ2-9    Flowsheet Row Clinical Support from 05/10/2022 in [facility] Office Visit from 05/06/2022 in [place] [facility] [place] Visit from 03/30/2022 in [facility] Office Visit from 03/02/2022 in [place] [facility] Visit from 01/17/2022 in [place] at [facility]  PHQ-2 Total Score 3 0 2 0 2  PHQ-9 Total Score 10 0 14 0 5      Flowsheet Row  Clinical Support from 05/10/2022 in [facility] Office Visit from 03/30/2022 in [facility] ED from 01/08/2022 in [facility] Emergency Department at [place] No Risk High Risk No Risk        Assessment and Plan: ***    Collaboration of Care: Collaboration of Care: Medication Management AEB provider managing patient's psychiatric medications, Psychiatrist AEB patient being seen by mental health provider at this facility, and Referral or follow-up with counselor/therapist AEB patient being seen by a licensed clinical social worker at this facility  Patient/Guardian was advised Release of Information must be obtained prior to any record release in order to collaborate their care with an outside provider. Patient/Guardian was advised if they have not already done so to contact the registration department to sign all necessary forms in order for [place] to release information regarding their care.   Consent: Patient/Guardian gives verbal consent for treatment and assignment of benefits for services provided during this visit. Patient/Guardian expressed understanding and agreed to proceed.   1. Anxiety state  - sertraline (ZOLOFT) 50 MG tablet; Take 3 tablets (150 mg total) by mouth daily.  Dispense: 90 tablet; Refill: 2  2. Moderate episode of recurrent major depressive disorder (HCC)  - sertraline (ZOLOFT) 50 MG tablet; Take 3 tablets (150 mg total) by mouth daily.  Dispense: 90 tablet; Refill: 2  Patient to follow-up in 2 months Provider spent a total of 28 minutes with the patient/reviewing patient's chart  Malachy Mood, PA 05/10/2022, 5:23 PM

## 2022-05-11 ENCOUNTER — Encounter (HOSPITAL_COMMUNITY): Payer: Self-pay

## 2022-05-11 ENCOUNTER — Telehealth (HOSPITAL_COMMUNITY): Payer: Self-pay | Admitting: Licensed Clinical Social Worker

## 2022-05-11 ENCOUNTER — Ambulatory Visit (INDEPENDENT_AMBULATORY_CARE_PROVIDER_SITE_OTHER): Payer: Medicaid Other | Admitting: Licensed Clinical Social Worker

## 2022-05-11 DIAGNOSIS — F331 Major depressive disorder, recurrent, moderate: Secondary | ICD-10-CM

## 2022-05-11 DIAGNOSIS — F411 Generalized anxiety disorder: Secondary | ICD-10-CM | POA: Diagnosis not present

## 2022-05-11 NOTE — Progress Notes (Signed)
Comprehensive Clinical Assessment (CCA) Note  05/11/2022 Christian [name] [id number]  Chief Complaint:  Chief Complaint  Patient presents with   Anxiety   Depression   Visit Diagnosis: GAD and MDD      Client is a 47 year old male.  Client is referred by medication provider  for a depression and anxiety.   Client states mental health symptoms as evidenced by:   Depression Difficulty Concentrating; Fatigue; Hopelessness; Worthlessness; Sleep (too much or little); Irritability Difficulty Concentrating; Fatigue; Hopelessness; Worthlessness; Sleep (too much or little); Irritability  Duration of Depressive Symptoms Greater than two weeks Greater than two weeks  Mania None None  Anxiety Tension; Worrying; Restlessness; Sleep Tension; Worrying; Restlessness; Sleep  Psychosis None None  Trauma None None  Obsessions None None  Compulsions None None  Inattention None None  Hyperactivity/Impulsivity None None  Oppositional/Defiant Behaviors None None  Emotional Irregularity Chronic feelings of emptiness Chronic feelings of emptiness     Client denies hallucinations and delusions at this time   Client was screened for the following SDOH: Financials, exercise, stress\tension, social interaction, depression, and housing.  Assessment Information that integrates subjective and objective details with a therapist's professional interpretation:     Christian [name] was alert and oriented x 5.  He presented with flat and anxious mood\affect.  He engaged well in comprehensive clinical assessment and was dressed casually.  Patient comes in as a referral from medication provider.  He reports primary stressors as illness, housing, and work.  Patient reports that he has not been able to work in several years due to anxiety such as tension, worry and panic attacks.  Patient reports passive suicidal ideations with no plan or intent at this time.  LCSW provided patient resources for suicide prevention hotline and  also [facility] urgent care.  Christian [name] reports insomnia due to panic attacks at night.  Patient reports primary support systems are his boarding homes landlord, church services, and friend from a different boardinghouse.  Patient also reports that he receives help from his cousin that is currently storing his stuff due to being homeless 2 years ago.  Christian [name] reports that his landlord is starting to threaten eviction  due to lack of payment and being unable to work.  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   Patient reports compliance with medication management and states that they have been decreasing anxiety and depression.  Patient reports illness for CHF and COPD.  Patient has history of wounds with sepsis.  Patient states 2 years ago his wounds are so bad he was hospitalized and this is when they connected him with services at the boardinghouse.  Patient states that he has formal help through [employer] support groups.   [place] reports grief and loss due to his parents passing away in 2015 and 2016.  Patient reports that his mother was his primary advocate and he was close to his parents.  Christian [name] reports 1 sister who he has limited contact with.  LCSW referred patient at this time to partial hospitalization program at [facility].  Patient to start individual therapy in 4 weeks.   Client states use of the following substances: none reported     Clinician assisted client with scheduling the following appointments: April 9th. Clinician details of appointment.    Client was in agreement with treatment recommendations.  CCA Screening, Triage and Referral (STR)  Patient Reported Information  How did you hear about [place]? Self  Referral name: Medication provider at psych office  What Is the Reason for Your Visit/Call Today? Christian [name] Depression  How Long Has This Been Causing You  Problems? > than 6 months  What Do You Feel Would Help You the Most Today? Treatment for Depression or other mood problem  Have You Recently Been in Any Inpatient Treatment (Hospital/Detox/Crisis Center/28-Day Program)? No    Have You Ever Received Services From [employer] Before? No   Have You Recently Had Any Thoughts About Hurting Yourself? Yes  Are You Planning to Commit Suicide/Harm Yourself At This time? No   Have you Recently Had Thoughts About [place]? No   Have You Used Any Alcohol or Drugs in the Past 24 Hours? No  Do You Currently Have a Therapist/Psychiatrist? Yes  Name of Therapist/Psychiatrist: PA at [place] Recently Discharged From Any Office Practice or Programs? No  Explanation of Discharge From Practice/Program: No data recorded    CCA Screening Triage Referral Assessment Type of Contact: Face-to-Face  Is CPS involved or ever been involved? Never  Is APS involved or ever been involved? Never  Patient Determined To Be At Risk for Harm To Self or Others Based on Review of Patient Reported Information or Presenting Complaint? No  Method: No Plan  Availability of Means: No access or NA  Intent: Vague intent or NA  Notification Required: No need or identified person  Are There Guns or Other Weapons in Your Home? No   Location of Assessment: [facility] Assessment Services   Does Patient Present under Involuntary Commitment? No  [place] of Residence: [place]  Patient Currently Receiving the Following Services: Medication Management  Determination of Need: Routine (7 days)  Options For Referral: Partial Hospitalization; Outpatient Therapy  CCA Biopsychosocial Intake/Chief Complaint:  Pt reports that he has not worked in two years. He has Hx of homlessness but currently stays in a boarding house and cannot pay for rent because of his lask of working. 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 Christian [name] reports concern for being kick out of his home. He is compliant with medication mgnt. Christian [name] reports passive SI with no plan or intent. Pt reports insomnia due to panic attacks at night.  Current Symptoms/Problems: tension, worry, worthlessness, hoplessness, insomnia, panic attacks  Patient Reported Schizophrenia/Schizoaffective Diagnosis in Past: No  Strengths: willing to engage in treatment  Preferences: therapy and medication mgnt    Type of Services Patient Feels are Needed: therapy   Mental Health Symptoms Depression:   Difficulty Concentrating; Fatigue; Hopelessness; Worthlessness; Sleep (too much or little); Irritability   Duration of Depressive symptoms:  Greater than two weeks   Mania:   None   Anxiety:    Tension; Worrying; Restlessness; Sleep   Psychosis:   None   Duration of Psychotic symptoms: No data recorded  Trauma:   None   Obsessions:   None   Compulsions:   None   Inattention:   None   Hyperactivity/Impulsivity:   None   Oppositional/Defiant Behaviors:   None   Emotional Irregularity:   Chronic feelings of emptiness   Other Mood/Personality Symptoms:  No data recorded   Mental Status Exam Appearance and self-care  Stature:   Average   Weight:   Overweight   Clothing:   Casual   Grooming:   Normal   Cosmetic use:   None   Posture/gait:   Normal  Motor activity:   Not Remarkable   Sensorium  Attention:   Distractible   Concentration:   Anxiety interferes   Orientation:   X5   Recall/memory:   Normal   Affect and Mood  Affect:   Anxious   Mood:   Anxious   Relating  Eye contact:   Fleeting   Facial expression:  No data recorded  Attitude toward examiner:   Cooperative   Thought and Language  Speech flow:  Slow; Soft   Thought content:   Appropriate to Mood and Circumstances   Preoccupation:   None   Hallucinations:   None   Organization:  No data recorded  [employer] of Knowledge:   Fair   Intelligence:   Average   Abstraction:   Functional   Judgement:   Fair   Reality Testing:   Adequate   Insight:   Fair   Decision Making:   Normal   Social Functioning  Social Maturity:   Isolates   Social Judgement:   "Street Smart"   Stress  Stressors:   Grief/losses; Housing; Illness   Coping Ability:   Exhausted; Overwhelmed   Skill Deficits:   Interpersonal   Supports:   Church; Family     Religion: Religion/Spirituality Are You A Religious Person?: Yes What is Your Religious Affiliation?: Unknown  Leisure/Recreation:    Exercise/Diet: Exercise/Diet Do You Exercise?: Yes What Type of Exercise Do You Do?: Run/Walk How Many Times a Week Do You Exercise?: 1-3 times a week Have You Gained or Lost A Significant Amount of Weight in the Past Six Months?: No Do You Follow a Special Diet?: No Do You Have Any Trouble Sleeping?: Yes Explanation of Sleeping Difficulties: trouble staying asleep   CCA Employment/Education Employment/Work Situation: Employment / Work Situation Employment Situation: Unemployed Patient's Job has Been Impacted by Current Illness: Yes Describe how Patient's Job has Been Impacted: panic attacks Has Patient ever Been in the [employer]?: No  Education: Education Is Patient Currently Attending School?: No Last Grade Completed: 12 Did Teacher, adult education From [employer]?: Yes Did Physicist, medical?: No Did You Attend Graduate School?: No Did You Have Any Difficulty At School?: Yes Were Any Medications Ever Prescribed For These Difficulties?: Yes Patient's Education Has Been Impacted by Current Illness: Yes How Does Current Illness Impact Education?: Pt reports severe anxiety delayed his graduation   CCA Family/Childhood History Family and Relationship History: Family history Marital status: Single Are you sexually active?: No What is your sexual orientation?: hetrsosexual Has your  sexual activity been affected by drugs, alcohol, medication, or emotional stress?: none reported  Childhood History:  Childhood History By whom was/is the patient raised?: Both parents Additional childhood history information: parents decease in June 07, 2013 & Jun 08, 2014 due to declining health problems. Description of patient's relationship with caregiver when they were a child: good with both Patient's description of current relationship with people who raised him/her: good and close with mother Does patient have siblings?: Yes Number of Siblings: 1 Description of patient's current relationship with siblings: is not close to sister she lives in [place] Did patient suffer any verbal/emotional/physical/sexual abuse as a child?: No Did patient suffer from severe childhood neglect?: No Has patient ever been sexually abused/assaulted/raped as an adolescent or adult?: No Was the patient ever a victim of a crime or a disaster?: No Witnessed domestic violence?: No Has patient been affected by domestic violence as an adult?: No  Child/Adolescent Assessment:     CCA Substance Use  Alcohol/Drug Use: Alcohol / Drug Use History of alcohol / drug use?: No history of alcohol / drug abuse DSM5 Diagnoses: Patient Active Problem List   Diagnosis Date Noted   Lung nodules 05/05/2022   Anxiety state 03/30/2022   Inadequate community resources 03/29/2022   MDD (major depressive disorder), recurrent episode, moderate ([place]) 03/29/2022   Housing instability, currently housed, at risk for homelessness 03/29/2022   Dyspnea 03/18/2022   OSA (obstructive sleep apnea) 03/17/2022   COVID 01/07/2022   Nocturnal hypoxia    Sepsis without acute organ dysfunction ([place])    Influenza A 01/15/2021   Homelessness 01/15/2021   Hypokalemia 01/15/2021   Cellulitis and abscess of buttock 01/08/2021   Prediabetes 01/08/2021   Pleural effusion 11/25/2020   Chronic diastolic CHF (congestive heart failure) ([place]) 11/25/2020    Depression 11/25/2020   Obesity hypoventilation syndrome ([place]) 10/17/2020   Acute on chronic respiratory failure with hypoxia and hypercapnia (HCC)    Bowel perforation (HCC)    Hypotension    Acute respiratory failure with hypoxemia (HCC)    Weakness    Cellulitis of abdominal wall 10/10/2020   Impaired fasting glucose    Hyperglycemia 10/09/2020   AKI (acute kidney injury) ([place]) 10/09/2020   Essential hypertension 10/09/2020   Chest pain 04/06/2011   Morbid obesity with BMI of 50.0-59.9, adult ([place])    Elevated blood pressure reading without diagnosis of hypertension    Referrals to Alternative Service(s): Referred to Alternative Service(s):   Place:   Date:   Time:    Referred to Alternative Service(s):   Place:   Date:   Time:    Referred to Alternative Service(s):   Place:   Date:   Time:    Referred to Alternative Service(s):   Place:   Date:   Time:      Collaboration of Care: Other Referral to PHP   Patient/Guardian was advised Release of Information must be obtained prior to any record release in order to collaborate their care with an outside provider. Patient/Guardian was advised if they have not already done so to contact the registration department to sign all necessary forms in order for [place] to release information regarding their care.   Consent: Patient/Guardian gives verbal consent for treatment and assignment of benefits for services provided during this visit. Patient/Guardian expressed understanding and agreed to proceed.   Christian Horn, LCSW

## 2022-05-12 ENCOUNTER — Other Ambulatory Visit (INDEPENDENT_AMBULATORY_CARE_PROVIDER_SITE_OTHER): Payer: Medicaid Other

## 2022-05-12 ENCOUNTER — Encounter: Payer: Self-pay | Admitting: Physician Assistant

## 2022-05-12 ENCOUNTER — Ambulatory Visit (INDEPENDENT_AMBULATORY_CARE_PROVIDER_SITE_OTHER): Payer: Medicaid Other | Admitting: Physician Assistant

## 2022-05-12 DIAGNOSIS — M1712 Unilateral primary osteoarthritis, left knee: Secondary | ICD-10-CM

## 2022-05-12 DIAGNOSIS — M1711 Unilateral primary osteoarthritis, right knee: Secondary | ICD-10-CM | POA: Diagnosis not present

## 2022-05-12 NOTE — Progress Notes (Signed)
Office Visit Note   Patient: Christian [name]           Date of Birth: 02/10/1976           MRN: [id number] Visit Date: 05/12/2022              Requested by: Dorna Mai, [address] [place],  [place] [address] PCP: Dorna Mai, MD   Assessment & Plan: Visit Diagnoses:  1. Primary osteoarthritis of left knee   2. Primary osteoarthritis of right knee     Plan:  Discussed with patient treatments recommend cortisone injection left knee as he has never had a cortisone injection.  Due to pain he is currently looking for a job and has some instability in his housing and at risk for homelessness he is unable to go to formal therapy at this point in time.  He is also unable to get to the gym.  I did show him some quad strengthening exercises he can do on his own.  Discussed with him weight loss and how this could greatly affect his knees.  Will see him back in just 3 weeks see how he is doing overall from the cortisone injection left knee may consider cortisone injection right knee.  Questions were encouraged and answered at length  Follow-Up Instructions: Return in about 3 weeks (around 06/02/2022).   Orders:  Orders Placed This Encounter  Procedures   XR Knee 1-2 Views Left   XR Knee 1-2 Views Right   No orders of the defined types were placed in this encounter.     Procedures: No procedures performed   Clinical Data: No additional findings.   Subjective: Chief Complaint  Patient presents with   Left Knee - Pain   Right Knee - Pain    HPI Patient is a 47 year old male with bilateral knee pain for years.  Its increased over the last few months.  He has pain with sitting for too long and also when standing for prolonged period time has had no treatment.  He was given meloxicam however has not started taking this this was given by his primary care physician position.  Otherwise no treatment needs.  No injury to either knee.  Left knee is more painful than  the right.  Most knee pain is around the knee.  No mechanical symptoms of either knee.  He is nondiabetic the glucose impaired.  He also does have a history of acute kidney disease.  History of cellulitis and buttocks abscess in the past.  Also history of bowel  perforation.  Review of Systems See HPI otherwise negative  Objective: Vital Signs: There were no vitals taken for this visit.  Physical Exam Constitutional:      Appearance: He is not ill-appearing or diaphoretic.  Pulmonary:     Effort: Pulmonary effort is normal.  Neurological:     Mental Status: He is alert and oriented to person, place, and time.  Psychiatric:        Mood and Affect: Mood normal.     Ortho Exam Bilateral knees: No abnormal warmth erythema or effusion.  No instability valgus varus stressing.  Significant patellofemoral crepitus bilateral knees.  Tenderness medial joint line left knee.  Ambulates without any assistive device. Specialty Comments:  No specialty comments available.  Imaging: XR Knee 1-2 Views Left  Result Date: 05/12/2022 Left knee 2 views: No acute fractures.  Knee is well located.  Bone-on-bone medial compartment.  Moderate lateral compartmental narrowing.  Severe patellofemoral arthritic changes.  Periarticular spurring throughout the knee.  XR Knee 1-2 Views Right  Result Date: 05/12/2022 Right knee: Knee is well located.  No acute fractures.  Moderate narrowing medial and lateral joint lines.  Patellofemoral changes.  Periarticular osteophytes all 3 compartments.    PMFS History: Patient Active Problem List   Diagnosis Date Noted   Lung nodules 05/05/2022   GAD (generalized anxiety disorder) 03/30/2022   Inadequate community resources 03/29/2022   MDD (major depressive disorder), recurrent episode, moderate ([place]) 03/29/2022   Housing instability, currently housed, at risk for homelessness 03/29/2022   Dyspnea 03/18/2022   OSA (obstructive sleep apnea) 03/17/2022   COVID  01/07/2022   Nocturnal hypoxia    Sepsis without acute organ dysfunction ([place])    Influenza A 01/15/2021   Homelessness 01/15/2021   Hypokalemia 01/15/2021   Cellulitis and abscess of buttock 01/08/2021   Prediabetes 01/08/2021   Pleural effusion 11/25/2020   Chronic diastolic CHF (congestive heart failure) ([place]) 11/25/2020   Depression 11/25/2020   Obesity hypoventilation syndrome ([place]) 10/17/2020   Acute on chronic respiratory failure with hypoxia and hypercapnia (HCC)    Bowel perforation (HCC)    Hypotension    Acute respiratory failure with hypoxemia (HCC)    Weakness    Cellulitis of abdominal wall 10/10/2020   Impaired fasting glucose    Hyperglycemia 10/09/2020   AKI (acute kidney injury) ([place]) 10/09/2020   Essential hypertension 10/09/2020   Chest pain 04/06/2011   Morbid obesity with BMI of 50.0-59.9, adult ([facility])    Elevated blood pressure reading without diagnosis of hypertension    Past Medical History:  Diagnosis Date   Allergic rhinitis    Anxiety    CHF (congestive heart failure) (HCC)    Depression    Depression    Elevated blood pressure reading without diagnosis of hypertension    History of chicken pox    Obesity    Pleural effusion    Sepsis ([place])    Sleep apnea    O2 at night    Family History  Problem Relation Age of Onset   Stroke Mother    Diabetes Mother        pre-diabetes   Heart disease Mother        CHF, pacer   Cancer Mother        lymphoma   Stomach cancer Mother    Clotting disorder Mother    Coronary artery disease Father 47       MI   Hypertension Father    Hyperlipidemia Father    Diabetes Father    Heart attack Father    Migraines Sister    Stroke Paternal Grandmother    Diabetes Paternal Grandmother    Heart disease Paternal Grandfather    Parkinson's disease Paternal Grandfather    Heart attack Paternal Grandfather     Past Surgical History:  Procedure Laterality Date   COLON SURGERY     perforated bowel    TONSILLECTOMY AND ADENOIDECTOMY  03/01/1983   Social History   Occupational History   Not on file  Tobacco Use   Smoking status: Never   Smokeless tobacco: Never   Tobacco comments:    Living in homeless shelter  Vaping Use   Vaping Use: Never used  Substance and Sexual Activity   Alcohol use: Yes    Alcohol/week: 2.0 standard drinks of alcohol    Types: 2 Cans of beer per week    Comment: 1-2 beers 1-2 days  a week   Drug use: Never   Sexual activity: Not Currently

## 2022-05-13 ENCOUNTER — Ambulatory Visit (HOSPITAL_COMMUNITY): Payer: Medicaid Other | Admitting: Licensed Clinical Social Worker

## 2022-05-13 DIAGNOSIS — F331 Major depressive disorder, recurrent, moderate: Secondary | ICD-10-CM

## 2022-05-13 NOTE — Psych (Signed)
Virtual Visit via Video Note  I connected with Brunilda Payor on 05/13/22 at 10:00 AM EDT by a video enabled telemedicine application and verified that I am speaking with the correct person using two identifiers.  Location: Patient: pt's home Provider: clinical home office   I discussed the limitations of evaluation and management by telemedicine and the availability of in person appointments. The patient expressed understanding and agreed to proceed.   I discussed the assessment and treatment plan with the patient. The patient was provided an opportunity to ask questions and all were answered. The patient agreed with the plan and demonstrated an understanding of the instructions.   The patient was advised to call back or seek an in-person evaluation if the symptoms worsen or if the condition fails to improve as anticipated.  I provided 35 minutes of non-face-to-face time during this encounter.   Heron Nay, LCSW   Cln initially met with pt on [employer] due to pt not being signed onto WebEx for PHP CCA. Cln reminded pt that CCA is done via WebEx due to group being held through [employer]. Pt checked his email and stated he did not receive cln's email with appointment email or WebEx invite. Cln confirmed email address: [email or web address], which was the email pt provided and cln repeated during phone call on 3/13. Pt said it is correct and checked his Spam folder, and it was not there either. Cln sent reminder email through WebEx and pt stated he still did not receive it. Pt then stated, "I want to make sure you have the correct email. [place] as in my last name, "R" as in [place], "J" 01 at [email or web address]." Cln asked pt if his email is through yahoo or gmail and pt said "Yes." Cln again asked if pt uses gmail or yahoo, and pt said it is yahoo. Cln sent WebEx email to yahoo address and pt received it.  During the assessment, it was found that pt does not meet criteria for PHP. He reports he  experiences panic attacks once or twice per week for the past 3-4 months. "When I lay down to go to sleep at night, I get this feeling like I want to get up and start screaming. And sometimes I have thoughts that it would be better for me not to be here anymore. I feel like I don't know what to do and I just feel like I'm out of control."  He states he also feels restless during these moments and will start pounding the bed. He denies SI at other times and denies plan or intent and describes the thoughts as passive death thoughts. Additionally, pt's PHQ score was 10. Cln informed pt he does not meet medical necessity for PHP, thus Medicaid would most likely deny coverage. Cln recommended pt continue in outpatient therapy and medication management and offered a referral for a therapist with more availability than [facility] and advised pt to keep scheduled appointments. Cln also recommended pt connect with [facility] for group support. Pt reported he is already connected with [facility] and requested cln email him referral info. Cln emailed pt contact info for [employer], who is credentialed with [facility] Medicaid, and advised pt he could contact [facility] Medicaid for other referrals if needed. Cln also provided contact info for [facility] and recommended pt inquire about case management for assistance with accessing other community resources.

## 2022-05-30 DIAGNOSIS — G4733 Obstructive sleep apnea (adult) (pediatric): Secondary | ICD-10-CM | POA: Diagnosis not present

## 2022-06-02 ENCOUNTER — Ambulatory Visit: Payer: Medicaid Other | Admitting: Family Medicine

## 2022-06-06 ENCOUNTER — Encounter: Payer: Self-pay | Admitting: Physician Assistant

## 2022-06-06 ENCOUNTER — Ambulatory Visit (INDEPENDENT_AMBULATORY_CARE_PROVIDER_SITE_OTHER): Payer: Medicaid Other | Admitting: Physician Assistant

## 2022-06-06 DIAGNOSIS — M1712 Unilateral primary osteoarthritis, left knee: Secondary | ICD-10-CM

## 2022-06-06 DIAGNOSIS — M1711 Unilateral primary osteoarthritis, right knee: Secondary | ICD-10-CM

## 2022-06-06 MED ORDER — LIDOCAINE HCL 1 % IJ SOLN
3.0000 mL | INTRAMUSCULAR | Status: AC | PRN
  Administered 2022-06-06: 3 mL

## 2022-06-06 MED ORDER — METHYLPREDNISOLONE ACETATE 40 MG/ML IJ SUSP
40.0000 mg | INTRAMUSCULAR | Status: AC | PRN
  Administered 2022-06-06: 40 mg via INTRA_ARTICULAR

## 2022-06-06 NOTE — Progress Notes (Signed)
   Procedure Note  Patient: Christian [name]             Date of Birth: [birth year]           MRN: [id number]             Visit Date: 06/06/2022 HPI: Christian [name] returns today in follow-up of his left knee.  He states that left knee injection helped.  No longer bothers him is much as the right knee does now.  He unfortunately has lost his housing.  He is currently being put up in a hotel by a friend of his but that runs out later this week.  He is having difficulty even obtaining food.  He states he really has not had time to exercise because he has been very concerned about his possibility of becoming homeless.  Review of systems: See HPI otherwise negative  Right knee: No abnormal warmth erythema or effusion.  Positive patellofemoral crepitus with range of motion.  Procedures: Visit Diagnoses:  1. Primary osteoarthritis of right knee   2. Primary osteoarthritis of left knee     Large Joint Inj: R knee on 06/06/2022 5:10 PM Indications: pain Details: 22 G 1.5 in needle, anterolateral approach  Arthrogram: No  Medications: 3 mL lidocaine 1 %; 40 mg methylPREDNISolone acetate 40 MG/ML Outcome: tolerated well, no immediate complications Procedure, treatment alternatives, risks and benefits explained, specific risks discussed. Consent was given by the patient. Immediately prior to procedure a time out was called to verify the correct patient, procedure, equipment, support staff and site/side marked as required. Patient was prepped and draped in the usual sterile fashion.    Plan: Patient returns almost the Christian [name] here in our office as contacted DSS to see if they can help with housing and then will be in contact with Christian [name].  In regards to his knee discussed quad strengthening with him again.  Discussed knee exercises.  He knows to wait least 3 months between injections.  Questions were encouraged and answered he will follow-up with [place] as needed.

## 2022-06-07 ENCOUNTER — Ambulatory Visit (INDEPENDENT_AMBULATORY_CARE_PROVIDER_SITE_OTHER): Payer: Medicaid Other | Admitting: Licensed Clinical Social Worker

## 2022-06-07 DIAGNOSIS — F411 Generalized anxiety disorder: Secondary | ICD-10-CM | POA: Diagnosis not present

## 2022-06-07 DIAGNOSIS — F331 Major depressive disorder, recurrent, moderate: Secondary | ICD-10-CM
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
## 2022-06-08 ENCOUNTER — Other Ambulatory Visit (HOSPITAL_COMMUNITY): Payer: Self-pay | Admitting: Physician Assistant

## 2022-06-08 DIAGNOSIS — F331 Major depressive disorder, recurrent, moderate: Secondary | ICD-10-CM

## 2022-06-08 DIAGNOSIS — F411 Generalized anxiety disorder: Secondary | ICD-10-CM

## 2022-06-09 ENCOUNTER — Ambulatory Visit: Payer: Medicaid Other | Admitting: Cardiovascular Disease

## 2022-06-09 NOTE — Progress Notes (Deleted)
No chief complaint on file.  History of Present Illness:47 yo male with history of anxiety, depression and sleep apnea who is here today as a new consult, referred ***, for the evaluation of dyspnea. He tells me today that he *** Echo September 2022 with LVEF=55-60%, mld LVH. Normal RV function. No valve disease.   Primary Care Physician: Georganna SkeansWilson, Amelia, MD   Past Medical History:  Diagnosis Date   Allergic rhinitis    Anxiety    CHF (congestive heart failure)    Depression    Depression    Elevated blood pressure reading without diagnosis of hypertension    History of chicken pox    Obesity    Pleural effusion    Sepsis    Sleep apnea    O2 at night    Past Surgical History:  Procedure Laterality Date   COLON SURGERY     perforated bowel   TONSILLECTOMY AND ADENOIDECTOMY  03/01/1983    Current Outpatient Medications  Medication Sig Dispense Refill   albuterol (VENTOLIN HFA) 108 (90 Base) MCG/ACT inhaler Inhale 2 puffs into the lungs every 6 (six) hours as needed for wheezing or shortness of breath. 8 g 3   fluticasone (FLONASE) 50 MCG/ACT nasal spray Spray 2 sprays into both nostrils once daily. 16 g 2   meloxicam (MOBIC) 15 MG tablet Take 1 tablet (15 mg total) by mouth daily. 30 tablet 2   OXYGEN Inhale 2 L/min into the lungs at bedtime.     sertraline (ZOLOFT) 50 MG tablet Take 3 tablets (150 mg total) by mouth daily. 90 tablet 2   torsemide (DEMADEX) 20 MG tablet Take 1 tablet (20 mg total) by mouth 2 (two) times daily. 180 tablet 1   vitamin B-12 (CYANOCOBALAMIN) 100 MCG tablet Take 1 tablet (100 mcg total) by mouth once daily. 30 tablet 0   Current Facility-Administered Medications  Medication Dose Route Frequency Provider Last Rate Last Admin   cholecalciferol (VITAMIN D3) tablet 1,000 Units  1,000 Units Oral Daily Regalado, Belkys A, MD        Allergies  Allergen Reactions   Shellfish Allergy Nausea Only    Social History   Socioeconomic History    Marital status: Single    Spouse name: Not on file   Number of children: 0   Years of education: Not on file   Highest education level: Not on file  Occupational History   Not on file  Tobacco Use   Smoking status: Never   Smokeless tobacco: Never   Tobacco comments:    Living in homeless shelter  Vaping Use   Vaping Use: Never used  Substance and Sexual Activity   Alcohol use: Yes    Alcohol/week: 2.0 standard drinks of alcohol    Types: 2 Cans of beer per week    Comment: 1-2 beers 1-2 days a week   Drug use: Never   Sexual activity: Not Currently  Other Topics Concern   Not on file  Social History Narrative   Caffeine: occasional   Lives alone, near parents.     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   Edu: some college   Diet: some water, some fruits/vegetables, 1-2x/wk red meat, fish 1x/wk   Social Determinants of Health   Financial Resource Strain: High Risk (05/11/2022)   Overall Financial Resource Strain (CARDIA)    Difficulty of Paying Living Expenses: Very hard  Food Insecurity: No Food Insecurity (12/24/2020)   Hunger Vital Sign  Worried About Programme researcher, broadcasting/film/video in the Last Year: Never true    Ran Out of Food in the Last Year: Never true  Transportation Needs: No Transportation Needs (12/24/2020)   PRAPARE - Administrator, Civil Service (Medical): No    Lack of Transportation (Non-Medical): No  Physical Activity: Insufficiently Active (05/11/2022)   Exercise Vital Sign    Days of Exercise per Week: 3 days    Minutes of Exercise per Session: 20 min  Stress: Stress Concern Present (05/11/2022)   [employer] of Occupational Health - Occupational Stress Questionnaire    Feeling of Stress : To some extent  Social Connections: Moderately Isolated (05/11/2022)   Social Connection and Isolation Panel [NHANES]    Frequency of Communication with Friends and Family: More than three times a week    Frequency of Social Gatherings with Friends  and Family: Twice a week    Attends Religious Services: More than 4 times per year    Active Member of [employer] or Organizations: No    Attends Banker Meetings: Never    Marital Status: Never married  Intimate Partner Violence: Not At Risk (05/11/2022)   Humiliation, Afraid, Rape, and Kick questionnaire    Fear of Current or Ex-Partner: No    Emotionally Abused: No    Physically Abused: No    Sexually Abused: No    Family History  Problem Relation Age of Onset   Stroke Mother    Diabetes Mother        pre-diabetes   Heart disease Mother        CHF, pacer   Cancer Mother        lymphoma   Stomach cancer Mother    Clotting disorder Mother    Coronary artery disease Father 81       MI   Hypertension Father    Hyperlipidemia Father    Diabetes Father    Heart attack Father    Migraines Sister    Stroke Paternal Grandmother    Diabetes Paternal Grandmother    Heart disease Paternal Grandfather    Parkinson's disease Paternal Grandfather    Heart attack Paternal Grandfather     Review of Systems:  As stated in the HPI and otherwise negative.   There were no vitals taken for this visit.  Physical Examination: General: Well developed, well nourished, NAD  HEENT: OP clear, mucus membranes moist  SKIN: warm, dry. No rashes. Neuro: No focal deficits  Musculoskeletal: Muscle strength 5/5 all ext  Psychiatric: Mood and affect normal  Neck: No JVD, no carotid bruits, no thyromegaly, no lymphadenopathy.  Lungs:Clear bilaterally, no wheezes, rhonci, crackles Cardiovascular: Regular rate and rhythm. No murmurs, gallops or rubs. Abdomen:Soft. Bowel sounds present. Non-tender.  Extremities: No lower extremity edema. Pulses are 2 + in the bilateral DP/PT.  EKG:  EKG {ACTION; IS/IS [id number]} ordered today. The ekg ordered today demonstrates ***  Recent Labs: 01/08/2022: B Natriuretic Peptide 42.9; BUN 10; Creatinine, Ser 1.13; Hemoglobin 14.0; Platelets 300;  Potassium 3.4; Sodium 135   Lipid Panel    Component Value Date/Time   CHOL 153 12/24/2020 1227   TRIG 126 12/24/2020 1227   HDL 42 12/24/2020 1227   CHOLHDL 3.6 12/24/2020 1227   LDLCALC 88 12/24/2020 1227     Wt Readings from Last 3 Encounters:  05/06/22 (!) 164.5 kg  05/05/22 (!) 165.6 kg  03/18/22 (!) 169.3 kg      Assessment and Plan:   1.  Labs/ tests ordered today include:  No orders of the defined types were placed in this encounter.    Disposition:   F/U with me in ***    Signed, Verne Carrow, MD, [facility] 06/09/2022 6:34 AM    [facility] [address] [place], [place], [place]  [address] Phone: [phone]; Fax: [phone]

## 2022-06-10 DIAGNOSIS — G4733 Obstructive sleep apnea (adult) (pediatric): Secondary | ICD-10-CM | POA: Diagnosis not present

## 2022-06-14 ENCOUNTER — Emergency Department (HOSPITAL_COMMUNITY): Payer: Medicaid Other

## 2022-06-14 ENCOUNTER — Inpatient Hospital Stay (HOSPITAL_COMMUNITY)
Admission: EM | Admit: 2022-06-14 | Discharge: 2022-06-25 | DRG: 193 | Disposition: A | Discharge status: Home / Self Care | Payer: Medicaid Other | Attending: Internal Medicine | Admitting: Internal Medicine

## 2022-06-14 ENCOUNTER — Other Ambulatory Visit: Payer: Self-pay

## 2022-06-14 DIAGNOSIS — Z820 Family history of epilepsy and other diseases of the nervous system: Secondary | ICD-10-CM

## 2022-06-14 DIAGNOSIS — Z91199 Patient's noncompliance with other medical treatment and regimen due to unspecified reason: Secondary | ICD-10-CM | POA: Diagnosis not present

## 2022-06-14 DIAGNOSIS — Z791 Long term (current) use of non-steroidal anti-inflammatories (NSAID): Secondary | ICD-10-CM

## 2022-06-14 DIAGNOSIS — Z8249 Family history of ischemic heart disease and other diseases of the circulatory system: Secondary | ICD-10-CM

## 2022-06-14 DIAGNOSIS — E876 Hypokalemia: Secondary | ICD-10-CM | POA: Diagnosis not present

## 2022-06-14 DIAGNOSIS — Z5900 Homelessness unspecified: Secondary | ICD-10-CM

## 2022-06-14 DIAGNOSIS — Z9981 Dependence on supplemental oxygen: Secondary | ICD-10-CM

## 2022-06-14 DIAGNOSIS — J189 Pneumonia, unspecified organism: Principal | ICD-10-CM

## 2022-06-14 DIAGNOSIS — I110 Hypertensive heart disease with heart failure: Secondary | ICD-10-CM | POA: Diagnosis not present

## 2022-06-14 DIAGNOSIS — Z800 Family history of malignant neoplasm of digestive organs: Secondary | ICD-10-CM | POA: Diagnosis not present

## 2022-06-14 DIAGNOSIS — F32A Depression, unspecified: Secondary | ICD-10-CM | POA: Diagnosis present

## 2022-06-14 DIAGNOSIS — F419 Anxiety disorder, unspecified: Secondary | ICD-10-CM | POA: Diagnosis present

## 2022-06-14 DIAGNOSIS — R059 Cough, unspecified: Secondary | ICD-10-CM | POA: Diagnosis not present

## 2022-06-14 DIAGNOSIS — Z1152 Encounter for screening for COVID-19: Secondary | ICD-10-CM | POA: Diagnosis not present

## 2022-06-14 DIAGNOSIS — J9601 Acute respiratory failure with hypoxia: Secondary | ICD-10-CM | POA: Diagnosis not present

## 2022-06-14 DIAGNOSIS — Z832 Family history of diseases of the blood and blood-forming organs and certain disorders involving the immune mechanism: Secondary | ICD-10-CM | POA: Diagnosis not present

## 2022-06-14 DIAGNOSIS — Z83438 Family history of other disorder of lipoprotein metabolism and other lipidemia: Secondary | ICD-10-CM

## 2022-06-14 DIAGNOSIS — J9621 Acute and chronic respiratory failure with hypoxia: Secondary | ICD-10-CM | POA: Diagnosis not present

## 2022-06-14 DIAGNOSIS — Z807 Family history of other malignant neoplasms of lymphoid, hematopoietic and related tissues: Secondary | ICD-10-CM | POA: Diagnosis not present

## 2022-06-14 DIAGNOSIS — I5033 Acute on chronic diastolic (congestive) heart failure: Secondary | ICD-10-CM | POA: Diagnosis not present

## 2022-06-14 DIAGNOSIS — R6883 Chills (without fever): Secondary | ICD-10-CM | POA: Diagnosis not present

## 2022-06-14 DIAGNOSIS — Z833 Family history of diabetes mellitus: Secondary | ICD-10-CM

## 2022-06-14 DIAGNOSIS — Z6841 Body Mass Index (BMI) 40.0 and over, adult: Secondary | ICD-10-CM | POA: Diagnosis not present

## 2022-06-14 DIAGNOSIS — R0602 Shortness of breath: Secondary | ICD-10-CM | POA: Diagnosis not present

## 2022-06-14 DIAGNOSIS — G4733 Obstructive sleep apnea (adult) (pediatric): Secondary | ICD-10-CM | POA: Diagnosis present

## 2022-06-14 DIAGNOSIS — R0902 Hypoxemia: Secondary | ICD-10-CM | POA: Diagnosis not present

## 2022-06-14 DIAGNOSIS — Z823 Family history of stroke: Secondary | ICD-10-CM

## 2022-06-14 DIAGNOSIS — J069 Acute upper respiratory infection, unspecified: Secondary | ICD-10-CM | POA: Diagnosis not present

## 2022-06-14 DIAGNOSIS — Z79899 Other long term (current) drug therapy: Secondary | ICD-10-CM | POA: Diagnosis not present

## 2022-06-14 DIAGNOSIS — J9809 Other diseases of bronchus, not elsewhere classified: Secondary | ICD-10-CM | POA: Diagnosis not present

## 2022-06-14 DIAGNOSIS — B9789 Other viral agents as the cause of diseases classified elsewhere: Secondary | ICD-10-CM | POA: Diagnosis not present

## 2022-06-14 LAB — BASIC METABOLIC PANEL
Anion gap: 5 (ref 5–15)
BUN: 16 mg/dL (ref 6–20)
CO2: 29 mmol/L (ref 22–32)
Calcium: 7.9 mg/dL — ABNORMAL LOW (ref 8.9–10.3)
Chloride: 98 mmol/L (ref 98–111)
Creatinine, Ser: 1.3 mg/dL — ABNORMAL HIGH (ref 0.61–1.24)
GFR, Estimated: >60 mL/min (ref >60)
Glucose, Bld: 120 mg/dL — ABNORMAL HIGH (ref 70–99)
Potassium: 3.5 mmol/L (ref 3.5–5.1)
Sodium: 132 mmol/L — ABNORMAL LOW (ref 135–145)

## 2022-06-14 LAB — SARS CORONAVIRUS 2 BY RT PCR: SARS Coronavirus 2 by RT PCR: NEGATIVE

## 2022-06-14 LAB — CBC
HCT: 45 % (ref 39.0–52.0)
Hemoglobin: 14.1 g/dL (ref 13.0–17.0)
MCH: 26.3 pg (ref 26.0–34.0)
MCHC: 31.3 g/dL (ref 30.0–36.0)
MCV: 84 fL (ref 80.0–100.0)
Platelets: 304 10*3/uL (ref 150–400)
RBC: 5.36 MIL/uL (ref 4.22–5.81)
RDW: 14 % (ref 11.5–15.5)
WBC: 16.5 10*3/uL — ABNORMAL HIGH (ref 4.0–10.5)
nRBC: 0 % (ref 0.0–0.2)

## 2022-06-14 LAB — LACTIC ACID, PLASMA: Lactic Acid, Venous: 1.2 mmol/L (ref 0.5–1.9)

## 2022-06-14 LAB — GROUP A STREP BY PCR: Group A Strep by PCR: NOT DETECTED

## 2022-06-14 MED ORDER — SERTRALINE HCL 50 MG PO TABS
150.0000 mg | ORAL_TABLET | Freq: Every day | ORAL | Status: DC
  Administered 2022-06-15 – 2022-06-25 (×11): 150 mg via ORAL
  Filled 2022-06-14 (×6): qty 1
  Filled 2022-06-14: qty 3
  Filled 2022-06-14 (×5): qty 1

## 2022-06-14 MED ORDER — SODIUM CHLORIDE 0.9 % IV SOLN
2.0000 g | INTRAVENOUS | Status: AC
  Administered 2022-06-15 – 2022-06-20 (×6): 2 g via INTRAVENOUS
  Filled 2022-06-14 (×6): qty 20

## 2022-06-14 MED ORDER — SODIUM CHLORIDE 0.9 % IV SOLN
500.0000 mg | INTRAVENOUS | Status: AC
  Administered 2022-06-15 – 2022-06-20 (×6): 500 mg via INTRAVENOUS
  Filled 2022-06-14 (×6): qty 5

## 2022-06-14 MED ORDER — IPRATROPIUM-ALBUTEROL 0.5-2.5 (3) MG/3ML IN SOLN: 3.0000 mL | Freq: Four times a day (QID) | RESPIRATORY_TRACT | Status: DC

## 2022-06-14 MED ORDER — SODIUM CHLORIDE 0.9 % IV SOLN: INTRAVENOUS | Status: AC

## 2022-06-14 MED ORDER — ACETAMINOPHEN 500 MG PO TABS
1000.0000 mg | ORAL_TABLET | Freq: Once | ORAL | Status: AC
  Administered 2022-06-14: 1000 mg via ORAL
  Filled 2022-06-14: qty 2

## 2022-06-14 MED ORDER — ACETAMINOPHEN 325 MG PO TABS
650.0000 mg | ORAL_TABLET | Freq: Four times a day (QID) | ORAL | Status: DC | PRN
  Administered 2022-06-15 – 2022-06-22 (×6): 650 mg via ORAL
  Filled 2022-06-14 (×6): qty 2

## 2022-06-14 MED ORDER — GUAIFENESIN 100 MG/5ML PO LIQD
5.0000 mL | ORAL | Status: DC | PRN
  Administered 2022-06-15: 5 mL via ORAL
  Filled 2022-06-14 (×2): qty 10

## 2022-06-14 MED ORDER — ENOXAPARIN SODIUM 80 MG/0.8ML IJ SOSY
80.0000 mg | PREFILLED_SYRINGE | INTRAMUSCULAR | Status: DC
  Administered 2022-06-15 – 2022-06-25 (×11): 80 mg via SUBCUTANEOUS
  Filled 2022-06-14 (×12): qty 0.8

## 2022-06-14 MED ORDER — MELATONIN 5 MG PO TABS: 5.0000 mg | ORAL_TABLET | Freq: Every evening | ORAL | Status: DC | PRN

## 2022-06-14 MED ORDER — PROCHLORPERAZINE EDISYLATE 10 MG/2ML IJ SOLN: 5.0000 mg | Freq: Four times a day (QID) | INTRAMUSCULAR | Status: DC | PRN

## 2022-06-14 MED ORDER — IBUPROFEN 800 MG PO TABS
800.0000 mg | ORAL_TABLET | Freq: Once | ORAL | Status: AC
  Administered 2022-06-14: 800 mg via ORAL
  Filled 2022-06-14: qty 1

## 2022-06-14 MED ORDER — SODIUM CHLORIDE 0.9 % IV SOLN
1.0000 g | Freq: Once | INTRAVENOUS | Status: AC
  Administered 2022-06-14: 1 g via INTRAVENOUS
  Filled 2022-06-14: qty 10

## 2022-06-14 MED ORDER — SODIUM CHLORIDE 0.9 % IV SOLN
500.0000 mg | Freq: Once | INTRAVENOUS | Status: AC
  Administered 2022-06-14: 500 mg via INTRAVENOUS
  Filled 2022-06-14: qty 5

## 2022-06-14 MED ORDER — POLYETHYLENE GLYCOL 3350 17 G PO PACK: 17.0000 g | PACK | Freq: Every day | ORAL | Status: DC | PRN

## 2022-06-14 NOTE — ED Notes (Signed)
Rn aware of pt b/p

## 2022-06-14 NOTE — ED Provider Notes (Addendum)
[place] [facility] AT [facility] Provider Note   CSN: [id number] Arrival date & time: 06/14/22  1429     History  Chief Complaint  Patient presents with   Sore Throat   Cough    Christian [name] is a 47 y.o. male with medical history of allergic rhinitis, anxiety, CHF, depression, obesity.  Patient presents to ED for evaluation of scratchy throat, body aches and chills, congestion.  Patient reports that symptoms began 2 days ago.  Patient reports he is living at [facility] currently, there are some sicknesses of going around.  Patient denies fevers, nausea, vomiting, diarrhea, abdominal pain.  Patient also endorsing shortness of breath and cough.  Denies medications prior to arrival.  Patient reports history of seasonal allergies.  Denies chest pain, leg swelling.  Patient denies hemoptysis, unilateral leg swelling, recent surgery or travel, history of DVT or PE, hormone use.   Sore Throat Associated symptoms include shortness of breath. Pertinent negatives include no chest pain.  Cough Associated symptoms: chills, fever, myalgias and shortness of breath   Associated symptoms: no chest pain        Home Medications Prior to Admission medications   Medication Sig Start Date End Date Taking? Authorizing Provider  albuterol (VENTOLIN HFA) 108 (90 Base) MCG/ACT inhaler Inhale 2 puffs into the lungs every 6 (six) hours as needed for wheezing or shortness of breath. 05/06/22   Parrett, Virgel Bouquet, NP  fluticasone (FLONASE) 50 MCG/ACT nasal spray Spray 2 sprays into both nostrils once daily. 03/02/22   [name], MD  meloxicam (MOBIC) 15 MG tablet Take 1 tablet (15 mg total) by mouth daily. 05/06/22   [name], MD  OXYGEN Inhale 2 L/min into the lungs at bedtime.    [provider]  sertraline (ZOLOFT) 50 MG tablet Take 3 tablets (150 mg total) by mouth daily. 05/10/22   Nwoko, Tommas Olp, PA  torsemide (DEMADEX) 20 MG tablet Take 1 tablet (20 mg total) by  mouth 2 (two) times daily. 03/02/22   [name], MD  vitamin B-12 (CYANOCOBALAMIN) 100 MCG tablet Take 1 tablet (100 mcg total) by mouth once daily. 12/24/20   Regalado, Jon Billings A, MD      Allergies    Shellfish allergy    Review of Systems   Review of Systems  Constitutional:  Positive for chills, fatigue and fever.  HENT:  Positive for congestion.   Respiratory:  Positive for cough and shortness of breath.   Cardiovascular:  Negative for chest pain.  Musculoskeletal:  Positive for myalgias.  All other systems reviewed and are negative.   Physical Exam Updated Vital Signs BP 139/67   Pulse 93   Temp (!) 101.3 F (38.5 C)   Resp 18   Wt (!) 167 kg   SpO2 91%   BMI 47.27 kg/m  Physical Exam Vitals and nursing note reviewed.  Constitutional:      General: He is not in acute distress.    Appearance: Normal appearance. He is not ill-appearing, toxic-appearing or diaphoretic.  HENT:     Head: Normocephalic and atraumatic.     Nose: Nose normal.     Mouth/Throat:     Mouth: Mucous membranes are moist.     Pharynx: Oropharynx is clear.  Eyes:     Extraocular Movements: Extraocular movements intact.     Conjunctiva/sclera: Conjunctivae normal.     Pupils: Pupils are equal, round, and reactive to light.  Cardiovascular:     Rate and  Rhythm: Regular rhythm. Tachycardia present.  Pulmonary:     Effort: Pulmonary effort is normal.     Breath sounds: Normal breath sounds. No wheezing.  Abdominal:     General: Abdomen is flat. Bowel sounds are normal.     Palpations: Abdomen is soft.     Tenderness: There is no abdominal tenderness.  Musculoskeletal:     Cervical back: Normal range of motion and neck supple. No tenderness.  Skin:    General: Skin is warm and dry.     Capillary Refill: Capillary refill takes less than 2 seconds.  Neurological:     General: No focal deficit present.     Mental Status: He is alert and oriented to person, place, and time.     GCS: GCS eye  subscore is 4. GCS verbal subscore is 5. GCS motor subscore is 6.     Cranial Nerves: Cranial nerves 2-12 are intact. No cranial nerve deficit.     Sensory: Sensation is intact. No sensory deficit.     Motor: Motor function is intact. No weakness.     ED Results / Procedures / Treatments   Labs (all labs ordered are listed, but only abnormal results are displayed) Labs Reviewed  CBC - Abnormal; Notable for the following components:      Result Value   WBC 16.5 (*)    All other components within normal limits  BASIC METABOLIC PANEL - Abnormal; Notable for the following components:   Sodium 132 (*)    Glucose, Bld 120 (*)    Creatinine, Ser 1.30 (*)    Calcium 7.9 (*)    All other components within normal limits  SARS CORONAVIRUS 2 BY RT PCR  GROUP A STREP BY PCR  CULTURE, BLOOD (ROUTINE X 2)  CULTURE, BLOOD (ROUTINE X 2)  LACTIC ACID, PLASMA  LACTIC ACID, PLASMA  HIV ANTIBODY (ROUTINE TESTING W REFLEX)  CBC  CREATININE, SERUM  CBC  COMPREHENSIVE METABOLIC PANEL  MAGNESIUM  PHOSPHORUS    EKG None  Radiology CT Chest Wo Contrast  Result Date: 06/14/2022 CLINICAL DATA:  Pneumonia, cough, chest congestion, dyspnea on exertion EXAM: CT CHEST WITHOUT CONTRAST TECHNIQUE: Multidetector CT imaging of the chest was performed following the standard protocol without IV contrast. RADIATION DOSE REDUCTION: This exam was performed according to the departmental dose-optimization program which includes automated exposure control, adjustment of the mA and/or kV according to patient size and/or use of iterative reconstruction technique. COMPARISON:  04/29/2022 FINDINGS: Cardiovascular: No significant vascular findings. Normal heart size. No pericardial effusion. Mediastinum/Nodes: No enlarged mediastinal or axillary lymph nodes. Thyroid gland, trachea, and esophagus demonstrate no significant findings. Lungs/Pleura: There is nodular ground-glass peribronchial and centrilobular infiltrate within  the left lower lobe and, to a lesser extent, within the basilar lingula and right middle lobe in keeping with changes of acute infection the appropriate clinical setting. Superimposed bronchial wall thickening noted in keeping with airway inflammation. Stable parenchymal scarring within the right upper and right middle lobe anteriorly. No pneumothorax or pleural effusion. No central obstructing lesion. Upper Abdomen: No acute abnormality. Musculoskeletal: Osseous structures are age-appropriate. No acute bone abnormality. No lytic or blastic bone lesion. IMPRESSION: 1. Nodular ground-glass peribronchial and centrilobular infiltrate within the left lower lobe and, to a lesser extent, within the basilar lingula and right middle lobe in keeping with changes of acute infection the appropriate clinical setting. No central obstructing lesion. 2. Superimposed bronchial wall thickening in keeping with airway inflammation. 3. Stable parenchymal scarring within the  right upper and right middle lobe anteriorly. Electronically Signed   By: Helyn Numbers M.D.   On: 06/14/2022 20:10   DG Chest 2 View  Result Date: 06/14/2022 CLINICAL DATA:  Shortness of breath, body aches, chills, cough EXAM: CHEST - 2 VIEW COMPARISON:  01/08/2022 FINDINGS: Heart and mediastinal contours are within normal limits. No focal opacities or effusions. No acute bony abnormality. IMPRESSION: No active cardiopulmonary disease. Electronically Signed   By: Charlett Nose M.D.   On: 06/14/2022 16:25    Procedures Procedures   Medications Ordered in ED Medications  enoxaparin (LOVENOX) injection 80 mg (has no administration in time range)  sertraline (ZOLOFT) tablet 150 mg (has no administration in time range)  0.9 %  sodium chloride infusion (has no administration in time range)  guaiFENesin (ROBITUSSIN) 100 MG/5ML liquid 5 mL (has no administration in time range)  acetaminophen (TYLENOL) tablet 650 mg (has no administration in time range)   prochlorperazine (COMPAZINE) injection 5 mg (has no administration in time range)  melatonin tablet 5 mg (has no administration in time range)  polyethylene glycol (MIRALAX / GLYCOLAX) packet 17 g (has no administration in time range)  ipratropium-albuterol (DUONEB) 0.5-2.5 (3) MG/3ML nebulizer solution 3 mL (has no administration in time range)  acetaminophen (TYLENOL) tablet 1,000 mg (1,000 mg Oral Given 06/14/22 1636)  cefTRIAXone (ROCEPHIN) 1 g in sodium chloride 0.9 % 100 mL IVPB (0 g Intravenous Stopped 06/14/22 2149)  azithromycin (ZITHROMAX) 500 mg in sodium chloride 0.9 % 250 mL IVPB (500 mg Intravenous New Bag/Given 06/14/22 2148)  ibuprofen (ADVIL) tablet 800 mg (800 mg Oral Given 06/14/22 2126)    ED Course/ Medical Decision Making/ A&P  Medical Decision Making Amount and/or Complexity of Data Reviewed Labs: ordered. Radiology: ordered.  Risk OTC drugs. Prescription drug management. Decision regarding hospitalization.   48 year old who presents to the ED for evaluation.  Please see HPI for further details.  On my examination the patient is afebrile and nontachycardic.  Lung sounds are clear bilaterally, he is not hypoxic.  Abdomen soft and compressible throughout.  Neurological examination at baseline without focal neurodeficits.  Patient overall nontoxic appearance.  CBC with leukocytosis to 16.5, no anemia.  BMP with sodium 132, creatinine 1.3 baseline.  COVID test negative.  Group A strep test negative.  Chest x-ray unremarkable.  Prior to discharge, patient reassessed.  Patient very tachypneic, complaining of shortness of breath.  Patient also febrile at this time despite Tylenol initially given.  Will proceed with CT scan of chest at this time.  CT scan of chest shows left lobe pneumonia.  Patient ambulated, oxygen saturation dropped down to 88%.  Patient will be admitted for hypoxia.  Patient provided ceftriaxone, azithromycin, Motrin for fever.  Patient discussed  with Dr. Margo Aye tried hospitalist who agrees to admit patient.  Patient stable at time of admission.   Final Clinical Impression(s) / ED Diagnoses Final diagnoses:  Community acquired pneumonia of left lung, unspecified part of lung  Hypoxia    Rx / DC Orders ED Discharge Orders     None         Al Decant, PA-C 06/14/22 1924    Al Decant, PA-C 06/14/22 2258    Elayne Snare K, DO 06/14/22 2359

## 2022-06-14 NOTE — ED Triage Notes (Signed)
BIBA from [facility] with c/o sore throat, cough, congestion, chills, and exertional  sob upon waking this am.  Pt talking in full sentences

## 2022-06-14 NOTE — Discharge Instructions (Addendum)
Return to ED with any new or worsening signs or symptoms Please treat symptoms conservatively at home.  Please take ibuprofen for body aches and chills.  Please take Tylenol for headaches and fevers.  Please stay hydrated. Please read the attached guide concerning viral illnesses

## 2022-06-14 NOTE — H&P (Signed)
History and Physical  Christian [name] ZOX:[id number] DOB: 1975-07-11 DOA: 06/14/2022  Referring physician: Wendi Snipes  PCP: [name], MD  Outpatient Specialists: None. Patient coming from: Homeless, stays at [facility].  Chief Complaint: Cough, shortness of breath, and sore throat  HPI: [name] is a 47 y.o. male with medical history significant for severe morbid obesity, allergic rhinitis, chronic anxiety, hypertension, who presented to [facility] ED with complaints of persistent cough, shortness of breath and sore throat for the past few days and gradually worsening.  In the ED, patient noted to be febrile with Tmax 101.3.  Tachycardic with pulse of 106.  With leukocytosis WBC 16.5 K.  Hypoxic with ambulation with O2 saturation dropping to the 80s which prompted CT evaluation.  CT chest with evidence of left lower lobe infiltrate suggestive of pneumonia.  The patient was started on empiric IV antibiotic Rocephin and azithromycin in the ED.  TR[facility], hospitalist service, was asked to admit.  ED Course: Tmax 101.3.  BP 130/65, pulse 95, respiratory rate 90% on 2 L.  Lab studies markable for WBC 16.5.  Serum sodium 132, serum glucose 120, creatinine 1.30 with GFR greater than 60.  Review of Systems: Review of systems as noted in the HPI. All other systems reviewed and are negative.   Past Medical History:  Diagnosis Date   Allergic rhinitis    Anxiety    CHF (congestive heart failure)    Depression    Depression    Elevated blood pressure reading without diagnosis of hypertension    History of chicken pox    Obesity    Pleural effusion    Sepsis    Sleep apnea    O2 at night   Past Surgical History:  Procedure Laterality Date   COLON SURGERY     perforated bowel   TONSILLECTOMY AND ADENOIDECTOMY  03/01/1983    Social History:  reports that he has never smoked. He has never used smokeless tobacco. He reports current alcohol use of about 2.0 standard drinks of alcohol per  week. He reports that he does not use drugs.   Allergies  Allergen Reactions   Shellfish Allergy Nausea Only    Family History  Problem Relation Age of Onset   Stroke Mother    Diabetes Mother        pre-diabetes   Heart disease Mother        CHF, pacer   Cancer Mother        lymphoma   Stomach cancer Mother    Clotting disorder Mother    Coronary artery disease Father 7       MI   Hypertension Father    Hyperlipidemia Father    Diabetes Father    Heart attack Father    Migraines Sister    Stroke Paternal Grandmother    Diabetes Paternal Grandmother    Heart disease Paternal Grandfather    Parkinson's disease Paternal Grandfather    Heart attack Paternal Grandfather       Prior to Admission medications   Medication Sig Start Date End Date Taking? Authorizing Provider  albuterol (VENTOLIN HFA) 108 (90 Base) MCG/ACT inhaler Inhale 2 puffs into the lungs every 6 (six) hours as needed for wheezing or shortness of breath. 05/06/22   Parrett, Virgel Bouquet, NP  fluticasone (FLONASE) 50 MCG/ACT nasal spray Spray 2 sprays into both nostrils once daily. 03/02/22   [name], MD  meloxicam (MOBIC) 15 MG tablet Take 1 tablet (15 mg total) by  mouth daily. 05/06/22   [name], MD  OXYGEN Inhale 2 L/min into the lungs at bedtime.    [provider]  sertraline (ZOLOFT) 50 MG tablet Take 3 tablets (150 mg total) by mouth daily. 05/10/22   Nwoko, Tommas Olp, PA  torsemide (DEMADEX) 20 MG tablet Take 1 tablet (20 mg total) by mouth 2 (two) times daily. 03/02/22   [name], MD  vitamin B-12 (CYANOCOBALAMIN) 100 MCG tablet Take 1 tablet (100 mcg total) by mouth once daily. 12/24/20   Regalado, Prentiss Bells, MD    Physical Exam: BP 139/67   Pulse 93   Temp (!) 101.3 F (38.5 C)   Resp 18   Wt (!) 167 kg   SpO2 91%   BMI 47.27 kg/m   General: 47 y.o. year-old male well developed well nourished in no acute distress.  Alert and oriented x3. Cardiovascular: Regular rate and  rhythm with no rubs or gallops.  No thyromegaly or JVD noted.  No lower extremity edema. 2/4 pulses in all 4 extremities. Respiratory: Mild rales at bases.  Poor inspiratory effort. Abdomen: Soft nontender nondistended with normal bowel sounds x4 quadrants. Muskuloskeletal: No cyanosis, clubbing or edema noted bilaterally Neuro: CN II-XII intact, strength, sensation, reflexes Skin: No ulcerative lesions noted or rashes Psychiatry: Judgement and insight appear normal. Mood is appropriate for condition and setting          Labs on Admission:  Basic Metabolic Panel: Recent Labs  Lab 06/14/22 1632  NA 132*  K 3.5  CL 98  CO2 29  GLUCOSE 120*  BUN 16  CREATININE 1.30*  CALCIUM 7.9*   Liver Function Tests: No results for input(s): "AST", "ALT", "ALKPHOS", "BILITOT", "PROT", "ALBUMIN" in the last 168 hours. No results for input(s): "LIPASE", "AMYLASE" in the last 168 hours. No results for input(s): "AMMONIA" in the last 168 hours. CBC: Recent Labs  Lab 06/14/22 1632  WBC 16.5*  HGB 14.1  HCT 45.0  MCV 84.0  PLT 304   Cardiac Enzymes: No results for input(s): "CKTOTAL", "CKMB", "CKMBINDEX", "TROPONINI" in the last 168 hours.  BNP (last 3 results) Recent Labs    01/08/22 1305  BNP 42.9    ProBNP (last 3 results) No results for input(s): "PROBNP" in the last 8760 hours.  CBG: No results for input(s): "GLUCAP" in the last 168 hours.  Radiological Exams on Admission: CT Chest Wo Contrast  Result Date: 06/14/2022 CLINICAL DATA:  Pneumonia, cough, chest congestion, dyspnea on exertion EXAM: CT CHEST WITHOUT CONTRAST TECHNIQUE: Multidetector CT imaging of the chest was performed following the standard protocol without IV contrast. RADIATION DOSE REDUCTION: This exam was performed according to the departmental dose-optimization program which includes automated exposure control, adjustment of the mA and/or kV according to patient size and/or use of iterative reconstruction  technique. COMPARISON:  04/29/2022 FINDINGS: Cardiovascular: No significant vascular findings. Normal heart size. No pericardial effusion. Mediastinum/Nodes: No enlarged mediastinal or axillary lymph nodes. Thyroid gland, trachea, and esophagus demonstrate no significant findings. Lungs/Pleura: There is nodular ground-glass peribronchial and centrilobular infiltrate within the left lower lobe and, to a lesser extent, within the basilar lingula and right middle lobe in keeping with changes of acute infection the appropriate clinical setting. Superimposed bronchial wall thickening noted in keeping with airway inflammation. Stable parenchymal scarring within the right upper and right middle lobe anteriorly. No pneumothorax or pleural effusion. No central obstructing lesion. Upper Abdomen: No acute abnormality. Musculoskeletal: Osseous structures are age-appropriate. No acute bone abnormality. No lytic  or blastic bone lesion. IMPRESSION: 1. Nodular ground-glass peribronchial and centrilobular infiltrate within the left lower lobe and, to a lesser extent, within the basilar lingula and right middle lobe in keeping with changes of acute infection the appropriate clinical setting. No central obstructing lesion. 2. Superimposed bronchial wall thickening in keeping with airway inflammation. 3. Stable parenchymal scarring within the right upper and right middle lobe anteriorly. Electronically Signed   By: Helyn Numbers M.D.   On: 06/14/2022 20:10   DG Chest 2 View  Result Date: 06/14/2022 CLINICAL DATA:  Shortness of breath, body aches, chills, cough EXAM: CHEST - 2 VIEW COMPARISON:  01/08/2022 FINDINGS: Heart and mediastinal contours are within normal limits. No focal opacities or effusions. No acute bony abnormality. IMPRESSION: No active cardiopulmonary disease. Electronically Signed   By: Charlett Nose M.D.   On: 06/14/2022 16:25    EKG: I independently viewed the EKG done and my findings are as followed: None  available at the time of this visit.  Assessment/Plan Present on Admission:  CAP (community acquired pneumonia)  Principal Problem:   CAP (community acquired pneumonia)  Sepsis secondary to left lower lobe community-acquired pneumonia, POA Presented with fever Tmax 101.3, leukocytosis 16.5, tachycardia 106 CT evidence of left lower lobe pneumonia. Started on Rocephin and IV azithromycin empirically, continue.   Obtain baseline procalcitonin Follow peripheral blood cultures, sputum culture Monitor fever curve and WBC  Chronic anxiety/depression Resume home Zoloft  Hypertension BP stable Monitor vital signs  Severe morbid obesity BMI 47 Recommend weight loss outpatient with regular physical activity and healthy dieting.     DVT prophylaxis: Subcu Lovenox daily  Code Status: Full code  Family Communication: None at bedside  Disposition Plan: Admitted to telemetry unit  Consults called: None  Admission status: Inpatient status.   Status is: Inpatient Inpatient status.  The patient requires at least 2 midnights for further evaluation and treatment of present condition.   Darlin Drop MD [facility] Pager [phone]  If 7PM-7AM, please contact night-coverage [email or web address] Password [facility]  06/14/2022, 10:21 PM

## 2022-06-14 NOTE — ED Notes (Signed)
Patient o2 sat 90-92% , HR 110-120's while ambulating.

## 2022-06-15 DIAGNOSIS — J189 Pneumonia, unspecified organism: Secondary | ICD-10-CM | POA: Diagnosis not present

## 2022-06-15 LAB — COMPREHENSIVE METABOLIC PANEL
ALT: 14 U/L (ref 0–44)
AST: 20 U/L (ref 15–41)
Albumin: 3.2 g/dL — ABNORMAL LOW (ref 3.5–5.0)
Alkaline Phosphatase: 65 U/L (ref 38–126)
Anion gap: 8 (ref 5–15)
BUN: 16 mg/dL (ref 6–20)
CO2: 31 mmol/L (ref 22–32)
Calcium: 7.8 mg/dL — ABNORMAL LOW (ref 8.9–10.3)
Chloride: 99 mmol/L (ref 98–111)
Creatinine, Ser: 1.22 mg/dL (ref 0.61–1.24)
GFR, Estimated: >60 mL/min (ref >60)
Glucose, Bld: 131 mg/dL — ABNORMAL HIGH (ref 70–99)
Potassium: 3.2 mmol/L — ABNORMAL LOW (ref 3.5–5.1)
Sodium: 138 mmol/L (ref 135–145)
Total Bilirubin: 1.3 mg/dL — ABNORMAL HIGH (ref 0.3–1.2)
Total Protein: 6.4 g/dL — ABNORMAL LOW (ref 6.5–8.1)

## 2022-06-15 LAB — CULTURE, BLOOD (ROUTINE X 2)

## 2022-06-15 LAB — CBC
HCT: 40.7 % (ref 39.0–52.0)
Hemoglobin: 12.8 g/dL — ABNORMAL LOW (ref 13.0–17.0)
MCH: 26.6 pg (ref 26.0–34.0)
MCHC: 31.4 g/dL (ref 30.0–36.0)
MCV: 84.6 fL (ref 80.0–100.0)
Platelets: 253 10*3/uL (ref 150–400)
RBC: 4.81 MIL/uL (ref 4.22–5.81)
RDW: 14.1 % (ref 11.5–15.5)
WBC: 19.1 10*3/uL — ABNORMAL HIGH (ref 4.0–10.5)
nRBC: 0 % (ref 0.0–0.2)

## 2022-06-15 LAB — BLOOD GAS, VENOUS
Acid-Base Excess: 3.9 mmol/L — ABNORMAL HIGH (ref 0.0–2.0)
Bicarbonate: 28.5 mmol/L — ABNORMAL HIGH (ref 20.0–28.0)
O2 Saturation: 93.6 %
Patient temperature: 37
pCO2, Ven: 42 mmHg — ABNORMAL LOW (ref 44–60)
pH, Ven: 7.44 — ABNORMAL HIGH (ref 7.25–7.43)
pO2, Ven: 61 mmHg — ABNORMAL HIGH (ref 32–45)

## 2022-06-15 LAB — PHOSPHORUS: Phosphorus: 4.2 mg/dL (ref 2.5–4.6)

## 2022-06-15 LAB — BRAIN NATRIURETIC PEPTIDE: B Natriuretic Peptide: 39.5 pg/mL (ref 0.0–100.0)

## 2022-06-15 LAB — MAGNESIUM: Magnesium: 2.5 mg/dL — ABNORMAL HIGH (ref 1.7–2.4)

## 2022-06-15 LAB — PROCALCITONIN: Procalcitonin: 0.94 ng/mL

## 2022-06-15 MED ORDER — HYDRALAZINE HCL 20 MG/ML IJ SOLN: 10.0000 mg | INTRAMUSCULAR | Status: DC | PRN

## 2022-06-15 MED ORDER — POTASSIUM CHLORIDE CRYS ER 20 MEQ PO TBCR
40.0000 meq | EXTENDED_RELEASE_TABLET | Freq: Once | ORAL | Status: AC
  Administered 2022-06-15: 40 meq via ORAL
  Filled 2022-06-15: qty 2

## 2022-06-15 MED ORDER — METOPROLOL TARTRATE 5 MG/5ML IV SOLN: 5.0000 mg | INTRAVENOUS | Status: DC | PRN

## 2022-06-15 MED ORDER — IPRATROPIUM-ALBUTEROL 0.5-2.5 (3) MG/3ML IN SOLN
3.0000 mL | Freq: Four times a day (QID) | RESPIRATORY_TRACT | Status: DC | PRN
  Administered 2022-06-17: 3 mL via RESPIRATORY_TRACT
  Filled 2022-06-15: qty 3

## 2022-06-15 MED ORDER — ONDANSETRON HCL 4 MG/2ML IJ SOLN: 4.0000 mg | Freq: Four times a day (QID) | INTRAMUSCULAR | Status: DC | PRN

## 2022-06-15 MED ORDER — IPRATROPIUM-ALBUTEROL 0.5-2.5 (3) MG/3ML IN SOLN
3.0000 mL | Freq: Three times a day (TID) | RESPIRATORY_TRACT | Status: DC
  Administered 2022-06-15 – 2022-06-17 (×7): 3 mL via RESPIRATORY_TRACT
  Filled 2022-06-15 (×7): qty 3

## 2022-06-15 MED ORDER — TRAZODONE HCL 50 MG PO TABS
50.0000 mg | ORAL_TABLET | Freq: Every evening | ORAL | Status: DC | PRN
  Administered 2022-06-17 – 2022-06-24 (×8): 50 mg via ORAL
  Filled 2022-06-15 (×9): qty 1

## 2022-06-15 MED ORDER — SENNOSIDES-DOCUSATE SODIUM 8.6-50 MG PO TABS: 1.0000 | ORAL_TABLET | Freq: Every evening | ORAL | Status: DC | PRN

## 2022-06-15 NOTE — Progress Notes (Signed)
   06/15/22 1828  Vitals  Temp (!) 101.4 F (38.6 C)  Temp Source Oral  BP (!) 137/118  MAP (mmHg) 125  BP Location Right Arm  BP Method Automatic  Patient Position (if appropriate) Lying  Pulse Rate 91  Pulse Rate Source Monitor  Resp (!) 22   Yellow MEWS, MD notified. Tylenol given for fever.

## 2022-06-15 NOTE — Progress Notes (Signed)
PROGRESS NOTE    Christian [name]  DGL:[id number] DOB: 09-Mar-1975 DOA: 06/14/2022 PCP: [name], MD   Brief Narrative:  47 year old with severe morbid obesity, OSA not on CPAP, chronic hypoxia 2 L nasal cannula, allergic rhinitis, chronic anxiety, HTN presented to ED with productive cough and shortness of breath.  Patient was found to be septic secondary to left lower lobe pneumonia and started on IV Rocephin and azithromycin.   Assessment & Plan:  Principal Problem:   CAP (community acquired pneumonia)    Sepsis secondary to left lower lobe community-acquired pneumonia, POA Acute Resp Distress on 4L [place], baseline 2L [place] Sepsis physiology slowly improving except WBC is trending up.  CT scan shows evidence of left lower lobe pneumonia.  Continue Rocephin/azithromycin.  Bronchodilators/I-S/flutter valve.  Supportive care. Group A Strep - neg ProCal, BNP ordered.   Hypokalemia - As needed repletion   Chronic anxiety/depression Continue home Zoloft   Hypertension Will order IV as needed meds   Severe morbid obesity BMI 47 Recommend weight loss outpatient with regular physical activity and healthy dieting.   OSA CPAP qhs, noncompliant   DVT prophylaxis: Lovenox Code Status: Full code Family Communication:   Status is: Inpatient Cont hosp stay for management for CAP and hypoxia       Diet Orders (From admission, onward)     Start     Ordered   06/14/22 2224  Diet Heart Fluid consistency: Thin  Diet effective now       Question:  Fluid consistency:  Answer:  Thin   06/14/22 2223            Subjective: Seen at bedside, feels a little better but still has some resp dyspnea and coughing   Examination:  General exam: Appears calm and comfortable ; morbid obesity Respiratory system: b/l diffuse diminishes BS Cardiovascular system: S1 & S2 heard, RRR. No JVD, murmurs, rubs, gallops or clicks. 1+ b/l LE pitting edema.  Gastrointestinal system: Abdomen is  nondistended, soft and nontender. No organomegaly or masses felt. Normal bowel sounds heard. Central nervous system: Alert and oriented. No focal neurological deficits. Extremities: Symmetric 5 x 5 power. Skin: No rashes, lesions or ulcers Psychiatry: Judgement and insight appear normal. Mood & affect appropriate.  Objective: Vitals:   06/15/22 0630 06/15/22 0645 06/15/22 0700 06/15/22 0731  BP: 120/76 121/65 115/69   Pulse: 70 71 71   Resp:  18    Temp:    97.9 F (36.6 C)  TempSrc:    Oral  SpO2: 93% 95% 92%   Weight:        Intake/Output Summary (Last 24 hours) at 06/15/2022 0833 Last data filed at 06/14/2022 2248 Gross per 24 hour  Intake 350 ml  Output --  Net 350 ml   Filed Weights   06/14/22 1451  Weight: (!) 167 kg    Scheduled Meds:  cholecalciferol  1,000 Units Oral Daily   enoxaparin (LOVENOX) injection  80 mg Subcutaneous Q24H   ipratropium-albuterol  3 mL Nebulization TID   sertraline  150 mg Oral Daily   Continuous Infusions:  sodium chloride 50 mL/hr at 06/15/22 0237   azithromycin     cefTRIAXone (ROCEPHIN)  IV      Nutritional status     Body mass index is 47.27 kg/m.  Data Reviewed:   CBC: Recent Labs  Lab 06/14/22 1632 06/15/22 0518  WBC 16.5* 19.1*  HGB 14.1 12.8*  HCT 45.0 40.7  MCV 84.0 84.6  PLT 304 253  Basic Metabolic Panel: Recent Labs  Lab 06/14/22 1632 06/15/22 0518  NA 132* 138  K 3.5 3.2*  CL 98 99  CO2 29 31  GLUCOSE 120* 131*  BUN 16 16  CREATININE 1.30* 1.22  CALCIUM 7.9* 7.8*  MG  --  2.5*  PHOS  --  4.2   GFR: Estimated Creatinine Clearance: 121.6 mL/min (by C-G formula based on SCr of 1.22 mg/dL). Liver Function Tests: Recent Labs  Lab 06/15/22 0518  AST 20  ALT 14  ALKPHOS 65  BILITOT 1.3*  PROT 6.4*  ALBUMIN 3.2*   No results for input(s): "LIPASE", "AMYLASE" in the last 168 hours. No results for input(s): "AMMONIA" in the last 168 hours. Coagulation Profile: No results for input(s):  "INR", "PROTIME" in the last 168 hours. Cardiac Enzymes: No results for input(s): "CKTOTAL", "CKMB", "CKMBINDEX", "TROPONINI" in the last 168 hours. BNP (last 3 results) No results for input(s): "PROBNP" in the last 8760 hours. HbA1C: No results for input(s): "HGBA1C" in the last 72 hours. CBG: No results for input(s): "GLUCAP" in the last 168 hours. Lipid Profile: No results for input(s): "CHOL", "HDL", "LDLCALC", "TRIG", "CHOLHDL", "LDLDIRECT" in the last 72 hours. Thyroid Function Tests: No results for input(s): "TSH", "T4TOTAL", "FREET4", "T3FREE", "THYROIDAB" in the last 72 hours. Anemia Panel: No results for input(s): "VITAMINB12", "FOLATE", "FERRITIN", "TIBC", "IRON", "RETICCTPCT" in the last 72 hours. Sepsis Labs: Recent Labs  Lab 06/14/22 2100  LATICACIDVEN 1.2    Recent Results (from the past 240 hour(s))  SARS Coronavirus 2 by RT PCR (hospital order, performed in [facility] hospital lab) *cepheid single result test* Anterior Nasal Swab     Status: None   Collection Time: 06/14/22  4:35 PM   Specimen: Anterior Nasal Swab  Result Value Ref Range Status   SARS Coronavirus 2 by RT PCR NEGATIVE NEGATIVE Final    Comment: (NOTE) SARS-CoV-2 target nucleic acids are NOT DETECTED.  The SARS-CoV-2 RNA is generally detectable in upper and lower respiratory specimens during the acute phase of infection. The lowest concentration of SARS-CoV-2 viral copies this assay can detect is 250 copies / mL. A negative result does not preclude SARS-CoV-2 infection and should not be used as the sole basis for treatment or other patient management decisions.  A negative result may occur with improper specimen collection / handling, submission of specimen other than nasopharyngeal swab, presence of viral mutation(s) within the areas targeted by this assay, and inadequate number of viral copies (<250 copies / mL). A negative result must be combined with clinical observations, patient history,  and epidemiological information.  Fact Sheet for Patients:   RoadLapTop.co.za  Fact Sheet for Healthcare Providers: http://kim-miller.com/  This test is not yet approved or  cleared by the [place] FDA and has been authorized for detection and/or diagnosis of SARS-CoV-2 by FDA under an Emergency Use Authorization (EUA).  This EUA will remain in effect (meaning this test can be used) for the duration of the COVID-19 declaration under Section 564(b)(1) of the Act, 21 U.S.C. section 360bbb-3(b)(1), unless the authorization is terminated or revoked sooner.  Performed at [facility], [address] [place], [place] [address]   Group A Strep by PCR     Status: None   Collection Time: 06/14/22  4:35 PM   Specimen: Anterior Nasal Swab; Sterile Swab  Result Value Ref Range Status   Group A Strep by PCR NOT DETECTED NOT DETECTED Final    Comment: Performed at [facility], [address] [place], [place] [address]  Blood culture (routine x 2)     Status: None (Preliminary result)   Collection Time: 06/14/22  9:00 PM   Specimen: BLOOD  Result Value Ref Range Status   Specimen Description   Final    BLOOD SITE NOT SPECIFIED Performed at [facility], [address] [place], [place] [address]    Special Requests   Final    BOTTLES DRAWN AEROBIC AND ANAEROBIC Blood Culture adequate volume Performed at [facility], [address], [place], [place] [address]    Culture   Final    NO GROWTH < 12 HOURS Performed at [facility], [address], [place], [place] [address]    Report Status PENDING  Incomplete  Blood culture (routine x 2)     Status: None (Preliminary result)   Collection Time: 06/14/22  9:20 PM   Specimen: BLOOD  Result Value Ref Range Status   Specimen Description   Final    BLOOD BLOOD LEFT HAND Performed at [facility], [address], [place], [place] [address]    Special Requests   Final    BOTTLES DRAWN AEROBIC AND ANAEROBIC Blood Culture results may not be optimal due to an inadequate volume of blood received in culture bottles Performed at [facility], [address], [place], [place] [address]    Culture   Final    NO GROWTH < 12 HOURS Performed at [facility], [address], [place], [place] [address]    Report Status PENDING  Incomplete         Radiology Studies: CT Chest Wo Contrast  Result Date: 06/14/2022 CLINICAL DATA:  Pneumonia, cough, chest congestion, dyspnea on exertion EXAM: CT CHEST WITHOUT CONTRAST TECHNIQUE: Multidetector CT imaging of the chest was performed following the standard protocol without IV contrast. RADIATION DOSE REDUCTION: This exam was performed according to the departmental dose-optimization program which includes automated exposure control, adjustment of the mA and/or kV according to patient size and/or use of iterative reconstruction technique. COMPARISON:  04/29/2022 FINDINGS: Cardiovascular: No significant vascular findings. Normal heart size. No pericardial effusion. Mediastinum/Nodes: No enlarged mediastinal or axillary lymph nodes. Thyroid gland, trachea, and esophagus demonstrate no significant findings. Lungs/Pleura: There is nodular ground-glass peribronchial and centrilobular infiltrate within the left lower lobe and, to a lesser extent, within the basilar lingula and right middle lobe in keeping with changes of acute infection the appropriate clinical setting. Superimposed bronchial wall thickening noted in keeping with airway inflammation. Stable parenchymal scarring within the right upper and right middle lobe anteriorly. No pneumothorax or pleural effusion. No central obstructing lesion. Upper Abdomen: No acute abnormality. Musculoskeletal: Osseous structures are age-appropriate. No acute bone abnormality. No lytic or  blastic bone lesion. IMPRESSION: 1. Nodular ground-glass peribronchial and centrilobular infiltrate within the left lower lobe and, to a lesser extent, within the basilar lingula and right middle lobe in keeping with changes of acute infection the appropriate clinical setting. No central obstructing lesion. 2. Superimposed bronchial wall thickening in keeping with airway inflammation. 3. Stable parenchymal scarring within the right upper and right middle lobe anteriorly. Electronically Signed   By: Helyn Numbers M.D.   On: 06/14/2022 20:10   DG Chest 2 View  Result Date: 06/14/2022 CLINICAL DATA:  Shortness of breath, body aches, chills, cough EXAM: CHEST - 2 VIEW COMPARISON:  01/08/2022 FINDINGS: Heart and mediastinal contours are within normal limits. No  focal opacities or effusions. No acute bony abnormality. IMPRESSION: No active cardiopulmonary disease. Electronically Signed   By: Charlett Nose M.D.   On: 06/14/2022 16:25           LOS: 1 day   Time spent= 35 mins    Vamsi Apfel Joline Maxcy, MD [facility]  If 7PM-7AM, please contact night-coverage  06/15/2022, 8:33 AM

## 2022-06-15 NOTE — TOC Initial Note (Signed)
Transition of Care [facility]) - Initial/Assessment Note    Patient Details  Name: Christian [name] MRN: [id number] Date of Birth: 1975-08-16  Transition of Care [facility]) CM/SW Contact:    Durenda Guthrie, RN Phone Number: 06/15/2022, 2:21 PM  Clinical Narrative:                  Transition of Care [facility]) Department has reviewed patient and no TOC needs have been identified at this time. We will continue to monitor patient advancement through Interdisciplinary progressions and if new patient needs arise, please place a consult.       Patient Goals and CMS Choice            Expected Discharge Plan and Services                                              Prior Living Arrangements/Services                       Activities of Daily Living      Permission Sought/Granted                  Emotional Assessment              Admission diagnosis:  Hypoxia [R09.02] CAP (community acquired pneumonia) [J18.9] Community acquired pneumonia of left lung, unspecified part of lung [J18.9] Patient Active Problem List   Diagnosis Date Noted   CAP (community acquired pneumonia) 06/14/2022   Lung nodules 05/05/2022   GAD (generalized anxiety disorder) 03/30/2022   Inadequate community resources 03/29/2022   MDD (major depressive disorder), recurrent episode, moderate 03/29/2022   Housing instability, currently housed, at risk for homelessness 03/29/2022   Dyspnea 03/18/2022   OSA (obstructive sleep apnea) 03/17/2022   COVID 01/07/2022   Nocturnal hypoxia    Sepsis without acute organ dysfunction    Influenza A 01/15/2021   Homelessness 01/15/2021   Hypokalemia 01/15/2021   Cellulitis and abscess of buttock 01/08/2021   Prediabetes 01/08/2021   Pleural effusion 11/25/2020   Chronic diastolic CHF (congestive heart failure) 11/25/2020   Depression 11/25/2020   Obesity hypoventilation syndrome 10/17/2020   Acute on chronic respiratory failure with  hypoxia and hypercapnia    Bowel perforation    Hypotension    Acute respiratory failure with hypoxemia    Weakness    Cellulitis of abdominal wall 10/10/2020   Impaired fasting glucose    Hyperglycemia 10/09/2020   AKI (acute kidney injury) 10/09/2020   Essential hypertension 10/09/2020   Chest pain 04/06/2011   Morbid obesity with BMI of 50.0-59.9, adult    Elevated blood pressure reading without diagnosis of hypertension    PCP:  [name], MD Pharmacy:   [facility] [address], [address] [place] [address] Phone: [phone] Fax: [phone]  [facility] [address] [place]), [place] - [address] [place] ([place]) [place] [address] Phone: [phone] Fax: [phone]     Social Determinants of Health (SDOH) Social History: SDOH Screenings   Food Insecurity: No Food Insecurity (12/24/2020)  Housing: Medium Risk (05/11/2022)  Transportation Needs: No Transportation Needs (12/24/2020)  Utilities: Not At Risk (05/11/2022)  Alcohol Screen: Low Risk  (12/24/2020)  Depression (PHQ2-9): Medium Risk (05/11/2022)  Financial Resource Strain: High Risk (05/11/2022)  Physical Activity:  Insufficiently Active (05/11/2022)  Social Connections: Moderately Isolated (05/11/2022)  Stress: Stress Concern Present (05/11/2022)  Tobacco Use: Low Risk  (06/06/2022)   SDOH Interventions:     Readmission Risk Interventions    10/11/2020   12:09 PM  Readmission Risk Prevention Plan  Post Dischage Appt Complete  Medication Screening Complete  Transportation Screening Complete

## 2022-06-16 DIAGNOSIS — E876 Hypokalemia: Secondary | ICD-10-CM

## 2022-06-16 DIAGNOSIS — J9621 Acute and chronic respiratory failure with hypoxia: Secondary | ICD-10-CM | POA: Diagnosis not present

## 2022-06-16 DIAGNOSIS — J189 Pneumonia, unspecified organism: Secondary | ICD-10-CM | POA: Diagnosis not present

## 2022-06-16 DIAGNOSIS — Z5900 Homelessness unspecified: Secondary | ICD-10-CM

## 2022-06-16 LAB — BASIC METABOLIC PANEL
Anion gap: 9 (ref 5–15)
BUN: 11 mg/dL (ref 6–20)
CO2: 28 mmol/L (ref 22–32)
Calcium: 7.6 mg/dL — ABNORMAL LOW (ref 8.9–10.3)
Chloride: 100 mmol/L (ref 98–111)
Creatinine, Ser: 1 mg/dL (ref 0.61–1.24)
GFR, Estimated: >60 mL/min (ref >60)
Glucose, Bld: 99 mg/dL (ref 70–99)
Potassium: 3.2 mmol/L — ABNORMAL LOW (ref 3.5–5.1)
Sodium: 137 mmol/L (ref 135–145)

## 2022-06-16 LAB — MAGNESIUM: Magnesium: 2.5 mg/dL — ABNORMAL HIGH (ref 1.7–2.4)

## 2022-06-16 LAB — CBC
HCT: 36.3 % — ABNORMAL LOW (ref 39.0–52.0)
Hemoglobin: 11.5 g/dL — ABNORMAL LOW (ref 13.0–17.0)
MCH: 27.2 pg (ref 26.0–34.0)
MCHC: 31.7 g/dL (ref 30.0–36.0)
MCV: 85.8 fL (ref 80.0–100.0)
Platelets: 242 10*3/uL (ref 150–400)
RBC: 4.23 MIL/uL (ref 4.22–5.81)
RDW: 14.4 % (ref 11.5–15.5)
WBC: 7.5 10*3/uL (ref 4.0–10.5)
nRBC: 0 % (ref 0.0–0.2)

## 2022-06-16 LAB — CULTURE, BLOOD (ROUTINE X 2): Special Requests: ADEQUATE

## 2022-06-16 LAB — HIV ANTIBODY (ROUTINE TESTING W REFLEX): HIV Screen 4th Generation wRfx: NONREACTIVE

## 2022-06-16 MED ORDER — POTASSIUM CHLORIDE CRYS ER 20 MEQ PO TBCR
40.0000 meq | EXTENDED_RELEASE_TABLET | ORAL | Status: AC
  Administered 2022-06-16 (×2): 40 meq via ORAL
  Filled 2022-06-16 (×2): qty 2

## 2022-06-16 MED ORDER — FUROSEMIDE 40 MG PO TABS
40.0000 mg | ORAL_TABLET | Freq: Every day | ORAL | Status: DC
  Administered 2022-06-16 – 2022-06-19 (×4): 40 mg via ORAL
  Filled 2022-06-16 (×4): qty 1

## 2022-06-16 NOTE — Progress Notes (Signed)
PROGRESS NOTE  Christian [name] RUE:[id number] DOB: [birth year]   PCP: [name], MD  Patient is from: Homeless  DOA: 06/14/2022 LOS: 2  Chief complaints Chief Complaint  Patient presents with   Sore Throat   Cough     Brief Narrative / Interim history: 47 year old M with PMH of morbid obesity, OSA on CPAP and 2 L at night, anxiety and depression, homelessness and allergic rhinitis presenting with productive cough and shortness of breath, and admitted for sepsis due to left lower lobe pneumonia with acute on chronic respiratory failure.  Started on ceftriaxone and azithromycin.  Of note, patient has not been able to use his CPAP since he became homeless.   Subjective: Seen and examined earlier this morning.  No major events overnight of this morning other than isolated fever to 101.4 about 6 PM yesterday.  Continues to endorse chest congestion, productive cough and dyspnea with exertion.  Felt like his legs are swollen as well.  Denies GI or UTI symptoms.  Objective: Vitals:   06/16/22 0416 06/16/22 0740 06/16/22 0843 06/16/22 1142  BP: 132/70 132/73  131/62  Pulse: 86 81  85  Resp: 20 18    Temp: 98 F (36.7 C) 98.3 F (36.8 C)  98.8 F (37.1 C)  TempSrc: Oral   Oral  SpO2: 100% 96% 95% 94%  Weight:        Examination:  GENERAL: No apparent distress.  Nontoxic. HEENT: MMM.  Vision and hearing grossly intact.  NECK: Supple.  No apparent JVD.  RESP:  No IWOB.  Very rhonchorous. CVS:  RRR. Heart sounds normal.  ABD/GI/GU: BS+. Abd soft, NTND.  MSK/EXT:  Moves extremities. No apparent deformity.  1+ BLE edema. SKIN: no apparent skin lesion or wound NEURO: Awake, alert and oriented appropriately.  No apparent focal neuro deficit. PSYCH: Calm. Normal affect.   Procedures:  None  Microbiology summarized: Blood cultures NGTD COVID-19 and MRSA PCR nonreactive  Assessment and plan: Principal Problem:   CAP (community acquired pneumonia)  Acute on chronic  respiratory failure with hypoxia due to LLL pneumonia: Required 4 L.  At baseline, only uses 2 L with CPAP at night. CT scan shows evidence of left lower lobe pneumonia.  Had leukocytosis but no other SIRS on presentation.  Spiked isolated fever yesterday evening.  Sepsis ruled out. -Continue Rocephin/azithromycin, bronchodilators, IS, flutter valve.   -P.o. Lasix 40 mg daily  -Has 1+ BLE edema.  Strict intake and output, daily weight,  -renal functions and electrolytes  Chronic anxiety and depression: Stable -Continue home Zoloft  OSA on CPAP and 2 L at night: Reportedly has not been able to use his CPAP since he became homeless. -CPAP at night -TOC on board.   Hypokalemia -Monitor replenish as appropriate   Hypertension: Normotensive.  Homelessness -TOC consulted   Morbid obesity Body mass index is 47.27 kg/m.      DVT prophylaxis:  On subcu Lovenox  Code Status: Full code Family Communication: None at bedside Level of care: Med-Surg Status is: Inpatient Remains inpatient appropriate because: Respiratory failure with hypoxia   Final disposition: TBD Consultants:  None  35 minutes with more than 50% spent in reviewing records, counseling patient/family and coordinating care.   Sch Meds:  Scheduled Meds:  enoxaparin (LOVENOX) injection  80 mg Subcutaneous Q24H   furosemide  40 mg Oral Daily   ipratropium-albuterol  3 mL Nebulization TID   potassium chloride  40 mEq Oral Q4H   sertraline  150 mg Oral  Daily   Continuous Infusions:  sodium chloride 50 mL/hr at 06/15/22 0237   azithromycin 500 mg (06/15/22 2117)   cefTRIAXone (ROCEPHIN)  IV 2 g (06/16/22 0840)   PRN Meds:.acetaminophen, guaiFENesin, hydrALAZINE, ipratropium-albuterol, melatonin, metoprolol tartrate, ondansetron (ZOFRAN) IV, polyethylene glycol, prochlorperazine, senna-docusate, traZODone  Antimicrobials: Anti-infectives (From admission, onward)    Start     Dose/Rate Route Frequency Ordered  Stop   06/15/22 1000  cefTRIAXone (ROCEPHIN) 2 g in sodium chloride 0.9 % 100 mL IVPB        2 g 200 mL/hr over 30 Minutes Intravenous Every 24 hours 06/14/22 2325     06/14/22 2330  azithromycin (ZITHROMAX) 500 mg in sodium chloride 0.9 % 250 mL IVPB        500 mg 250 mL/hr over 60 Minutes Intravenous Every 24 hours 06/14/22 2325     06/14/22 2100  cefTRIAXone (ROCEPHIN) 1 g in sodium chloride 0.9 % 100 mL IVPB        1 g 200 mL/hr over 30 Minutes Intravenous  Once 06/14/22 2047 06/14/22 2149   06/14/22 2100  azithromycin (ZITHROMAX) 500 mg in sodium chloride 0.9 % 250 mL IVPB        500 mg 250 mL/hr over 60 Minutes Intravenous  Once 06/14/22 2047 06/14/22 2248        I have personally reviewed the following labs and images: CBC: Recent Labs  Lab 06/14/22 1632 06/15/22 0518 06/16/22 0611  WBC 16.5* 19.1* 7.5  HGB 14.1 12.8* 11.5*  HCT 45.0 40.7 36.3*  MCV 84.0 84.6 85.8  PLT 304 253 242   BMP &GFR Recent Labs  Lab 06/14/22 1632 06/15/22 0518 06/16/22 0610 06/16/22 0611  NA 132* 138  --  137  K 3.5 3.2*  --  3.2*  CL 98 99  --  100  CO2 29 31  --  28  GLUCOSE 120* 131*  --  99  BUN 16 16  --  11  CREATININE 1.30* 1.22  --  1.00  CALCIUM 7.9* 7.8*  --  7.6*  MG  --  2.5* 2.5*  --   PHOS  --  4.2  --   --    Estimated Creatinine Clearance: 148.4 mL/min (by C-G formula based on SCr of 1 mg/dL). Liver & Pancreas: Recent Labs  Lab 06/15/22 0518  AST 20  ALT 14  ALKPHOS 65  BILITOT 1.3*  PROT 6.4*  ALBUMIN 3.2*   No results for input(s): "LIPASE", "AMYLASE" in the last 168 hours. No results for input(s): "AMMONIA" in the last 168 hours. Diabetic: No results for input(s): "HGBA1C" in the last 72 hours. No results for input(s): "GLUCAP" in the last 168 hours. Cardiac Enzymes: No results for input(s): "CKTOTAL", "CKMB", "CKMBINDEX", "TROPONINI" in the last 168 hours. No results for input(s): "PROBNP" in the last 8760 hours. Coagulation Profile: No results  for input(s): "INR", "PROTIME" in the last 168 hours. Thyroid Function Tests: No results for input(s): "TSH", "T4TOTAL", "FREET4", "T3FREE", "THYROIDAB" in the last 72 hours. Lipid Profile: No results for input(s): "CHOL", "HDL", "LDLCALC", "TRIG", "CHOLHDL", "LDLDIRECT" in the last 72 hours. Anemia Panel: No results for input(s): "VITAMINB12", "FOLATE", "FERRITIN", "TIBC", "IRON", "RETICCTPCT" in the last 72 hours. Urine analysis:    Component Value Date/Time   COLORURINE YELLOW (A) 01/08/2021 1900   APPEARANCEUR CLEAR (A) 01/08/2021 1900   LABSPEC 1.017 01/08/2021 1900   PHURINE 5.0 01/08/2021 1900   GLUCOSEU NEGATIVE 01/08/2021 1900   HGBUR SMALL (A) 01/08/2021 1900  BILIRUBINUR NEGATIVE 01/08/2021 1900   KETONESUR NEGATIVE 01/08/2021 1900   PROTEINUR NEGATIVE 01/08/2021 1900   NITRITE NEGATIVE 01/08/2021 1900   LEUKOCYTESUR NEGATIVE 01/08/2021 1900   Sepsis Labs: Invalid input(s): "PROCALCITONIN", "LACTICIDVEN"  Microbiology: Recent Results (from the past 240 hour(s))  SARS Coronavirus 2 by RT PCR (hospital order, performed in [facility] hospital lab) *cepheid single result test* Anterior Nasal Swab     Status: None   Collection Time: 06/14/22  4:35 PM   Specimen: Anterior Nasal Swab  Result Value Ref Range Status   SARS Coronavirus 2 by RT PCR NEGATIVE NEGATIVE Final    Comment: (NOTE) SARS-CoV-2 target nucleic acids are NOT DETECTED.  The SARS-CoV-2 RNA is generally detectable in upper and lower respiratory specimens during the acute phase of infection. The lowest concentration of SARS-CoV-2 viral copies this assay can detect is 250 copies / mL. A negative result does not preclude SARS-CoV-2 infection and should not be used as the sole basis for treatment or other patient management decisions.  A negative result may occur with improper specimen collection / handling, submission of specimen other than nasopharyngeal swab, presence of viral mutation(s) within  the areas targeted by this assay, and inadequate number of viral copies (<250 copies / mL). A negative result must be combined with clinical observations, patient history, and epidemiological information.  Fact Sheet for Patients:   RoadLapTop.co.za  Fact Sheet for Healthcare Providers: http://kim-miller.com/  This test is not yet approved or  cleared by the [place] FDA and has been authorized for detection and/or diagnosis of SARS-CoV-2 by FDA under an Emergency Use Authorization (EUA).  This EUA will remain in effect (meaning this test can be used) for the duration of the COVID-19 declaration under Section 564(b)(1) of the Act, 21 U.S.C. section 360bbb-3(b)(1), unless the authorization is terminated or revoked sooner.  Performed at [facility], [address] [place], [place] [address]   Group A Strep by PCR     Status: None   Collection Time: 06/14/22  4:35 PM   Specimen: Anterior Nasal Swab; Sterile Swab  Result Value Ref Range Status   Group A Strep by PCR NOT DETECTED NOT DETECTED Final    Comment: Performed at [facility], [address], [place], [place] [address]  Blood culture (routine x 2)     Status: None (Preliminary result)   Collection Time: 06/14/22  9:00 PM   Specimen: BLOOD  Result Value Ref Range Status   Specimen Description   Final    BLOOD SITE NOT SPECIFIED Performed at [facility], [address] [place], [place] [address]    Special Requests   Final    BOTTLES DRAWN AEROBIC AND ANAEROBIC Blood Culture adequate volume Performed at [facility] [address], [place], [place] [address]    Culture   Final    NO GROWTH 2 DAYS Performed at [facility], [address] [place], [place] [address]    Report Status PENDING  Incomplete  Blood culture (routine x 2)     Status: None (Preliminary result)   Collection  Time: 06/14/22  9:20 PM   Specimen: BLOOD  Result Value Ref Range Status   Specimen Description   Final    BLOOD BLOOD LEFT HAND Performed at [facility], [address], [place], [place] [address]    Special Requests   Final    BOTTLES DRAWN AEROBIC AND ANAEROBIC Blood Culture results  may not be optimal due to an inadequate volume of blood received in culture bottles Performed at [facility], [address] [place], [place] [address]    Culture   Final    NO GROWTH 2 DAYS Performed at [facility], [address] [place], [place] [address]    Report Status PENDING  Incomplete    Radiology Studies: No results found.    Chanya Chrisley T. Travoris Bushey [facility]  If 7PM-7AM, please contact night-coverage [email or web address] 06/16/2022, 1:11 PM

## 2022-06-16 NOTE — Progress Notes (Signed)
Mobility Specialist - Progress Note   06/16/22 1000  Mobility  Activity Ambulated with assistance in hallway  Level of Assistance +2 (takes two people) (chair follow)  Assistive Device Front wheel walker  Distance Ambulated (ft) 70 ft  Activity Response Tolerated well  Mobility Referral Yes  $Mobility charge 1 Mobility   Pt received in bed and agreed to mobility, was on 2L and SpO2% was at 89%, RN advised to bump to 4L bringing resting SpO2% to 91%.   Ambulated 70 feet and SpO2% dropped to 86%. Wheeled pt back to room where he remained at 4L resting at 90%.  Pt in recliner with all needs met.   Marilynne Halsted Mobility Specialist

## 2022-06-16 NOTE — TOC Progression Note (Signed)
Transition of Care [facility]) - Progression Note    Patient Details  Name: Christian [name] MRN: [id number] Date of Birth: 04-13-1975  Transition of Care [facility]) CM/SW Contact  Otelia Santee, LCSW Phone Number: 06/16/2022, 1:35 PM  Clinical Narrative:    [facility] consulted for homeless issues with pt having CPAP but, being unable to use due to being currently homeless.  No program or resource available to assist with pt being able to use CPAP while homeless. Pt currently has been staying at [facility] in [place]. Pt on 2L O2 at baseline and is able to use O2 while at shelter.   Expected Discharge Plan: Homeless Shelter    Expected Discharge Plan and Services                                               Social Determinants of Health (SDOH) Interventions SDOH Screenings   Food Insecurity: No Food Insecurity (12/24/2020)  Housing: Medium Risk (05/11/2022)  Transportation Needs: No Transportation Needs (12/24/2020)  Utilities: Not At Risk (05/11/2022)  Alcohol Screen: Low Risk  (12/24/2020)  Depression (PHQ2-9): Medium Risk (05/11/2022)  Financial Resource Strain: High Risk (05/11/2022)  Physical Activity: Insufficiently Active (05/11/2022)  Social Connections: Moderately Isolated (05/11/2022)  Stress: Stress Concern Present (05/11/2022)  Tobacco Use: Low Risk  (06/06/2022)    Readmission Risk Interventions    06/16/2022    1:32 PM 10/11/2020   12:09 PM  Readmission Risk Prevention Plan  Post Dischage Appt  Complete  Medication Screening  Complete  Transportation Screening Complete Complete  PCP or Specialist Appt within 5-7 Days Complete   Home Care Screening Complete   Medication Review (RN CM) Complete

## 2022-06-17 DIAGNOSIS — J9621 Acute and chronic respiratory failure with hypoxia: Secondary | ICD-10-CM | POA: Diagnosis not present

## 2022-06-17 DIAGNOSIS — E876 Hypokalemia: Secondary | ICD-10-CM | POA: Diagnosis not present

## 2022-06-17 DIAGNOSIS — J189 Pneumonia, unspecified organism: Secondary | ICD-10-CM | POA: Diagnosis not present

## 2022-06-17 DIAGNOSIS — Z5900 Homelessness unspecified: Secondary | ICD-10-CM | POA: Diagnosis not present

## 2022-06-17 LAB — MAGNESIUM: Magnesium: 2.5 mg/dL — ABNORMAL HIGH (ref 1.7–2.4)

## 2022-06-17 LAB — RENAL FUNCTION PANEL
Albumin: 2.7 g/dL — ABNORMAL LOW (ref 3.5–5.0)
Anion gap: 5 (ref 5–15)
BUN: 11 mg/dL (ref 6–20)
CO2: 29 mmol/L (ref 22–32)
Calcium: 7.8 mg/dL — ABNORMAL LOW (ref 8.9–10.3)
Chloride: 104 mmol/L (ref 98–111)
Creatinine, Ser: 0.89 mg/dL (ref 0.61–1.24)
GFR, Estimated: >60 mL/min (ref >60)
Glucose, Bld: 92 mg/dL (ref 70–99)
Phosphorus: 3.3 mg/dL (ref 2.5–4.6)
Potassium: 3.7 mmol/L (ref 3.5–5.1)
Sodium: 138 mmol/L (ref 135–145)

## 2022-06-17 LAB — CBC
HCT: 37.3 % — ABNORMAL LOW (ref 39.0–52.0)
Hemoglobin: 11.5 g/dL — ABNORMAL LOW (ref 13.0–17.0)
MCH: 26.7 pg (ref 26.0–34.0)
MCHC: 30.8 g/dL (ref 30.0–36.0)
MCV: 86.5 fL (ref 80.0–100.0)
Platelets: 237 10*3/uL (ref 150–400)
RBC: 4.31 MIL/uL (ref 4.22–5.81)
RDW: 14.3 % (ref 11.5–15.5)
WBC: 5.9 10*3/uL (ref 4.0–10.5)
nRBC: 0 % (ref 0.0–0.2)

## 2022-06-17 MED ORDER — METHYLPREDNISOLONE SODIUM SUCC 125 MG IJ SOLR
125.0000 mg | Freq: Once | INTRAMUSCULAR | Status: AC
  Administered 2022-06-17: 125 mg via INTRAVENOUS
  Filled 2022-06-17: qty 2

## 2022-06-17 MED ORDER — METHYLPREDNISOLONE SODIUM SUCC 40 MG IJ SOLR
40.0000 mg | Freq: Two times a day (BID) | INTRAMUSCULAR | Status: AC
  Administered 2022-06-17 – 2022-06-22 (×10): 40 mg via INTRAVENOUS
  Filled 2022-06-17 (×10): qty 1

## 2022-06-17 MED ORDER — BUDESONIDE 0.5 MG/2ML IN SUSP
0.5000 mg | Freq: Two times a day (BID) | RESPIRATORY_TRACT | Status: DC
  Administered 2022-06-17 – 2022-06-25 (×17): 0.5 mg via RESPIRATORY_TRACT
  Filled 2022-06-17 (×17): qty 2

## 2022-06-17 MED ORDER — ARFORMOTEROL TARTRATE 15 MCG/2ML IN NEBU
15.0000 ug | INHALATION_SOLUTION | Freq: Two times a day (BID) | RESPIRATORY_TRACT | Status: DC
  Administered 2022-06-17 – 2022-06-25 (×17): 15 ug via RESPIRATORY_TRACT
  Filled 2022-06-17 (×17): qty 2

## 2022-06-17 MED ORDER — POTASSIUM CHLORIDE CRYS ER 20 MEQ PO TBCR
40.0000 meq | EXTENDED_RELEASE_TABLET | Freq: Once | ORAL | Status: AC
  Administered 2022-06-17: 40 meq via ORAL
  Filled 2022-06-17: qty 2

## 2022-06-17 NOTE — Evaluation (Signed)
Physical Therapy Evaluation Patient Details Name: Christian [name] MRN: [id number] DOB: Dec 09, 1975 Today's Date: 06/17/2022  History of Present Illness  47 yo male admitted to hospital on 06/14/2022 due to worsening productive cough, SOB and sore throat of the past few days. At presentation to ED pt was febrile, tachycardic with abn labs, and hypoxic requiring supplemental O2. CT revealed LLE infiltrate suggestive of PNA and new dx of sepsis secondary to CAP.  Pt PMH includes but is not limited to: CHF, anxiety, depression, pleural effusion, and OSA. Pt on CPAP however due to recent homelessness pt is unable to utilize.  Clinical Impression   Pt admitted with above diagnosis.  Pt currently with functional limitations due to the deficits listed below (see PT Problem List). Pt agreeable to therapy intervention. Pt is S for supine to sit, good sitting balance EOB with B LE support, STS from EOB with min guard and cues with pt electing to pull to stand at RW, gait assessed in hallway with RW, S, 4 L/min supplemental O2 cues for posture, safety with turns and coordinated breathing. Pt O2 saturation briefly decreased to 89% on 4 L/min and quickly recovered with pursed lip breathing. Pt left seated in recliner, all needs met and nurse aware of pt O2 saturation with exertion. Pt will benefit from acute skilled PT in current and next venue to increase their independence and safety with functional mobility.         Recommendations for follow up therapy are one component of a multi-disciplinary discharge planning process, led by the attending physician.  Recommendations may be updated based on patient status, additional functional criteria and insurance authorization.  Follow Up Recommendations       Assistance Recommended at Discharge Intermittent Supervision/Assistance  Patient can return home with the following  A little help with walking and/or transfers;A little help with  bathing/dressing/bathroom;Assistance with cooking/housework;Assist for transportation;Help with stairs or ramp for entrance    Equipment Recommendations Rolling walker (2 wheels) (may beneift from bariatric)  Recommendations for Other Services       Functional Status Assessment Patient has had a recent decline in their functional status and demonstrates the ability to make significant improvements in function in a reasonable and predictable amount of time.     Precautions / Restrictions Precautions Precautions: Fall;Other (comment) (supplemental O2, 4 L/min at eval and desaturated with exertion) Restrictions Weight Bearing Restrictions: No      Mobility  Bed Mobility Overal bed mobility: Needs Assistance Bed Mobility: Supine to Sit     Supine to sit: Supervision, HOB elevated (bed rails)     General bed mobility comments: increased time    Transfers Overall transfer level: Needs assistance Equipment used: Rolling walker (2 wheels) Transfers: Sit to/from Stand Sit to Stand: Min guard           General transfer comment: pull to stand, will benefit from cues for proper technique    Ambulation/Gait Ambulation/Gait assistance: Supervision Gait Distance (Feet): 80 Feet Assistive device: Rolling walker (2 wheels) Gait Pattern/deviations: Step-to pattern, Wide base of support, Trunk flexed Gait velocity: decreased     General Gait Details: absent L heel strike pt reports recent corisone injection due to pain and prior injury, limited foot clearance and cues for prused lip breathing. pt desaturated wtih gait tasks to 89% on 4 L/min and with cues able to recover quickly to 98%. once seated at rest in recliner 98% on 4 L/min; prior to functional mobility at rest in bed on  4 L/min pt 97%  Stairs            Wheelchair Mobility    Modified Rankin (Stroke Patients Only)       Balance Overall balance assessment: Needs assistance Sitting-balance support: Feet  supported Sitting balance-[name]: Good     Standing balance support: Bilateral upper extremity supported Standing balance-[name]: Poor                               Pertinent Vitals/Pain Pain Assessment Pain Assessment: 0-10 Pain Score: 5  Pain Location: L LE Pain Descriptors / Indicators: Aching, Discomfort, Constant Pain Intervention(s): Monitored during session, Limited activity within patient's tolerance, Repositioned    Home Living Family/patient expects to be discharged to:: Shelter/Homeless                        Prior Function Prior Level of Function : Independent/Modified Independent             Mobility Comments: pt sates no AD PLOF and mod I with all ADLs, self care tasks just increased time       Hand Dominance        Extremity/Trunk Assessment        Lower Extremity Assessment Lower Extremity Assessment: Generalized weakness    Cervical / Trunk Assessment Cervical / Trunk Assessment: Normal  Communication   Communication: No difficulties  Cognition Arousal/Alertness: Awake/alert Behavior During Therapy: WFL for tasks assessed/performed Overall Cognitive Status: Within Functional Limits for tasks assessed                                          General Comments      Exercises     Assessment/Plan    PT Assessment Patient needs continued PT services  PT Problem List Decreased strength;Decreased activity tolerance;Decreased balance;Decreased mobility;Decreased coordination;Decreased cognition;Cardiopulmonary status limiting activity       PT Treatment Interventions DME instruction;Gait training;Stair training;Functional mobility training;Therapeutic activities;Therapeutic exercise;Balance training;Neuromuscular re-education;Cognitive remediation    PT Goals (Current goals can be found in the Care Plan section)  Acute Rehab PT Goals Patient Stated Goal: to find a place to stay I can use  the CPAP PT Goal Formulation: With patient Time For Goal Achievement: 07/01/22 Potential to Achieve Goals: Good    Frequency Min 1X/week     Co-evaluation               AM-PAC PT "6 Clicks" Mobility  Outcome Measure Help needed turning from your back to your side while in a flat bed without using bedrails?: None Help needed moving from lying on your back to sitting on the side of a flat bed without using bedrails?: None Help needed moving to and from a bed to a chair (including a wheelchair)?: A Little Help needed standing up from a chair using your arms (e.g., wheelchair or bedside chair)?: A Little Help needed to walk in hospital room?: A Little Help needed climbing 3-5 steps with a railing? : A Lot 6 Click Score: 19    End of Session Equipment Utilized During Treatment: Gait belt Activity Tolerance: Patient limited by fatigue Patient left: in chair;with call bell/phone within reach Nurse Communication: Mobility status;Other (comment) (O2 saturation with exertion on 4 L/min) PT Visit Diagnosis: Unsteadiness on feet (R26.81);Other abnormalities of gait and mobility (  R26.89);Muscle weakness (generalized) (M62.81);Difficulty in walking, not elsewhere classified (R26.2)    Time: 0981-1914 PT Time Calculation (min) (ACUTE ONLY): 38 min   Charges:   PT Evaluation $PT Eval Low Complexity: 1 Low PT Treatments $Gait Training: 8-22 mins $Therapeutic Activity: 8-22 mins        Rica Mote, PT   Jacqualyn Posey 06/17/2022, 4:44 PM

## 2022-06-17 NOTE — Progress Notes (Signed)
Chaplain responded to spiritual care consult. Christian [name] was alert and willing to talk. He has been unable to obtain housing recently which he believes led to the illness he is currently hospitalized for. I provided reflective listening, prayer and empathic support.  Chaplain [place], [place]   06/17/22 1345  Spiritual Encounters  Type of Visit Initial  Care provided to: Patient  Referral source Nurse (RN/NT/LPN)  Reason for visit Routine spiritual support  Spiritual Framework  Presenting Themes Goals in life/care;Values and beliefs;Significant life change  Community/Connection Family;Friend(s);Faith community  Patient Stress Factors Health changes;Financial concerns;Major life changes  Interventions  Spiritual Care Interventions Made Compassionate presence;Established relationship of care and support;Reflective listening;Prayer  Intervention Outcomes  Outcomes Reduced anxiety;Awareness around self/spiritual resourses

## 2022-06-17 NOTE — Progress Notes (Signed)
PROGRESS NOTE  Christian [name] ZOX:[id number] DOB: 02-01-1976   PCP: [name], MD  Patient is from: Homeless  DOA: 06/14/2022 LOS: 3  Chief complaints Chief Complaint  Patient presents with   Sore Throat   Cough     Brief Narrative / Interim history: 47 year old M with PMH of morbid obesity, OSA on CPAP and 2 L at night, anxiety and depression, homelessness and allergic rhinitis presenting with productive cough and shortness of breath, and admitted for sepsis due to left lower lobe pneumonia with acute on chronic respiratory failure.  Started on ceftriaxone and azithromycin.  Of note, patient has not been able to use his CPAP since he became homeless.   Subjective: Seen and examined earlier this morning.  No major events overnight of this morning.  Continues to endorse shortness of breath especially with exertion.  Also productive cough.  Denies pain.  Excellent urine output with p.o. Lasix  Objective: Vitals:   06/17/22 0533 06/17/22 0729 06/17/22 1110 06/17/22 1216  BP: 128/76   122/83  Pulse: 64   72  Resp: 18   18  Temp: 97.6 F (36.4 C)   97.7 F (36.5 C)  TempSrc: Oral   Oral  SpO2: 98% (!) 89% (S) (!) 85% 99%  Weight:        Examination:  GENERAL: No apparent distress.  Nontoxic. HEENT: MMM.  Vision and hearing grossly intact.  NECK: Supple.  Difficult to assess JVD due to body habitus RESP:  No IWOB.  Very rhonchorous. CVS:  RRR. Heart sounds normal.  ABD/GI/GU: BS+. Abd soft, NTND.  MSK/EXT:   No apparent deformity. Moves extremities.  1+ BLE edema. SKIN: no apparent skin lesion or wound NEURO: Awake and alert. Oriented appropriately.  No apparent focal neuro deficit. PSYCH: Calm. Normal affect.   Procedures:  None  Microbiology summarized: Blood cultures NGTD COVID-19 and MRSA PCR nonreactive  Assessment and plan: Principal Problem:   CAP (community acquired pneumonia)  Acute on chronic respiratory failure with hypoxia due to LLL  pneumonia: Required 4 L.  At baseline, only uses 2 L with CPAP at night. CT scan shows evidence of left lower lobe pneumonia.  Had leukocytosis but no other SIRS on presentation.  Spiked isolated fever the evening of 4/17.  Still with significant rhonchi on exam.  Sepsis ruled out. -Continue Rocephin/azithromycin, bronchodilators, IS, flutter valve.   -Added Solu-Medrol -Still with 1+ BLE edema.  Continue p.o. Lasix 40 mg daily -Strict intake and output, daily weight,  -renal functions and electrolytes  Chronic anxiety and depression: Stable -Continue home Zoloft  OSA on CPAP and 2 L at night: Reportedly has not been able to use his CPAP since he became homeless. -CPAP at night -TOC on board.   Hypokalemia -Monitor replenish as appropriate   Hypertension: Normotensive.  Homelessness -TOC consulted   Morbid obesity Body mass index is 47.27 kg/m.      DVT prophylaxis:  On subcu Lovenox  Code Status: Full code Family Communication: None at bedside Level of care: Med-Surg Status is: Inpatient Remains inpatient appropriate because: Respiratory failure with hypoxia   Final disposition: TBD Consultants:  None  35 minutes with more than 50% spent in reviewing records, counseling patient/family and coordinating care.   Sch Meds:  Scheduled Meds:  arformoterol  15 mcg Nebulization BID   budesonide (PULMICORT) nebulizer solution  0.5 mg Nebulization BID   enoxaparin (LOVENOX) injection  80 mg Subcutaneous Q24H   furosemide  40 mg Oral Daily  methylPREDNISolone (SOLU-MEDROL) injection  40 mg Intravenous Q12H   sertraline  150 mg Oral Daily   Continuous Infusions:  azithromycin 500 mg (06/16/22 2101)   cefTRIAXone (ROCEPHIN)  IV 2 g (06/17/22 0835)   PRN Meds:.acetaminophen, guaiFENesin, hydrALAZINE, ipratropium-albuterol, melatonin, metoprolol tartrate, ondansetron (ZOFRAN) IV, polyethylene glycol, prochlorperazine, senna-docusate,  traZODone  Antimicrobials: Anti-infectives (From admission, onward)    Start     Dose/Rate Route Frequency Ordered Stop   06/15/22 1000  cefTRIAXone (ROCEPHIN) 2 g in sodium chloride 0.9 % 100 mL IVPB        2 g 200 mL/hr over 30 Minutes Intravenous Every 24 hours 06/14/22 2325     06/14/22 2330  azithromycin (ZITHROMAX) 500 mg in sodium chloride 0.9 % 250 mL IVPB        500 mg 250 mL/hr over 60 Minutes Intravenous Every 24 hours 06/14/22 2325     06/14/22 2100  cefTRIAXone (ROCEPHIN) 1 g in sodium chloride 0.9 % 100 mL IVPB        1 g 200 mL/hr over 30 Minutes Intravenous  Once 06/14/22 2047 06/14/22 2149   06/14/22 2100  azithromycin (ZITHROMAX) 500 mg in sodium chloride 0.9 % 250 mL IVPB        500 mg 250 mL/hr over 60 Minutes Intravenous  Once 06/14/22 2047 06/14/22 2248        I have personally reviewed the following labs and images: CBC: Recent Labs  Lab 06/14/22 1632 06/15/22 0518 06/16/22 0611 06/17/22 0506  WBC 16.5* 19.1* 7.5 5.9  HGB 14.1 12.8* 11.5* 11.5*  HCT 45.0 40.7 36.3* 37.3*  MCV 84.0 84.6 85.8 86.5  PLT 304 253 242 237   BMP &GFR Recent Labs  Lab 06/14/22 1632 06/15/22 0518 06/16/22 0610 06/16/22 0611 06/17/22 0506  NA 132* 138  --  137 138  K 3.5 3.2*  --  3.2* 3.7  CL 98 99  --  100 104  CO2 29 31  --  28 29  GLUCOSE 120* 131*  --  99 92  BUN 16 16  --  11 11  CREATININE 1.30* 1.22  --  1.00 0.89  CALCIUM 7.9* 7.8*  --  7.6* 7.8*  MG  --  2.5* 2.5*  --  2.5*  PHOS  --  4.2  --   --  3.3   Estimated Creatinine Clearance: 166.7 mL/min (by C-G formula based on SCr of 0.89 mg/dL). Liver & Pancreas: Recent Labs  Lab 06/15/22 0518 06/17/22 0506  AST 20  --   ALT 14  --   ALKPHOS 65  --   BILITOT 1.3*  --   PROT 6.4*  --   ALBUMIN 3.2* 2.7*   No results for input(s): "LIPASE", "AMYLASE" in the last 168 hours. No results for input(s): "AMMONIA" in the last 168 hours. Diabetic: No results for input(s): "HGBA1C" in the last 72  hours. No results for input(s): "GLUCAP" in the last 168 hours. Cardiac Enzymes: No results for input(s): "CKTOTAL", "CKMB", "CKMBINDEX", "TROPONINI" in the last 168 hours. No results for input(s): "PROBNP" in the last 8760 hours. Coagulation Profile: No results for input(s): "INR", "PROTIME" in the last 168 hours. Thyroid Function Tests: No results for input(s): "TSH", "T4TOTAL", "FREET4", "T3FREE", "THYROIDAB" in the last 72 hours. Lipid Profile: No results for input(s): "CHOL", "HDL", "LDLCALC", "TRIG", "CHOLHDL", "LDLDIRECT" in the last 72 hours. Anemia Panel: No results for input(s): "VITAMINB12", "FOLATE", "FERRITIN", "TIBC", "IRON", "RETICCTPCT" in the last 72 hours. Urine analysis:  Component Value Date/Time   COLORURINE YELLOW (A) 01/08/2021 1900   APPEARANCEUR CLEAR (A) 01/08/2021 1900   LABSPEC 1.017 01/08/2021 1900   PHURINE 5.0 01/08/2021 1900   GLUCOSEU NEGATIVE 01/08/2021 1900   HGBUR SMALL (A) 01/08/2021 1900   BILIRUBINUR NEGATIVE 01/08/2021 1900   KETONESUR NEGATIVE 01/08/2021 1900   PROTEINUR NEGATIVE 01/08/2021 1900   NITRITE NEGATIVE 01/08/2021 1900   LEUKOCYTESUR NEGATIVE 01/08/2021 1900   Sepsis Labs: Invalid input(s): "PROCALCITONIN", "LACTICIDVEN"  Microbiology: Recent Results (from the past 240 hour(s))  SARS Coronavirus 2 by RT PCR (hospital order, performed in [facility] hospital lab) *cepheid single result test* Anterior Nasal Swab     Status: None   Collection Time: 06/14/22  4:35 PM   Specimen: Anterior Nasal Swab  Result Value Ref Range Status   SARS Coronavirus 2 by RT PCR NEGATIVE NEGATIVE Final    Comment: (NOTE) SARS-CoV-2 target nucleic acids are NOT DETECTED.  The SARS-CoV-2 RNA is generally detectable in upper and lower respiratory specimens during the acute phase of infection. The lowest concentration of SARS-CoV-2 viral copies this assay can detect is 250 copies / mL. A negative result does not preclude SARS-CoV-2 infection and  should not be used as the sole basis for treatment or other patient management decisions.  A negative result may occur with improper specimen collection / handling, submission of specimen other than nasopharyngeal swab, presence of viral mutation(s) within the areas targeted by this assay, and inadequate number of viral copies (<250 copies / mL). A negative result must be combined with clinical observations, patient history, and epidemiological information.  Fact Sheet for Patients:   RoadLapTop.co.za  Fact Sheet for Healthcare Providers: http://kim-miller.com/  This test is not yet approved or  cleared by the [place] FDA and has been authorized for detection and/or diagnosis of SARS-CoV-2 by FDA under an Emergency Use Authorization (EUA).  This EUA will remain in effect (meaning this test can be used) for the duration of the COVID-19 declaration under Section 564(b)(1) of the Act, 21 U.S.C. section 360bbb-3(b)(1), unless the authorization is terminated or revoked sooner.  Performed at [facility], [address], [place], [place] [address]   Group A Strep by PCR     Status: None   Collection Time: 06/14/22  4:35 PM   Specimen: Anterior Nasal Swab; Sterile Swab  Result Value Ref Range Status   Group A Strep by PCR NOT DETECTED NOT DETECTED Final    Comment: Performed at [facility], [address], [place], [place] [address]  Blood culture (routine x 2)     Status: None (Preliminary result)   Collection Time: 06/14/22  9:00 PM   Specimen: BLOOD  Result Value Ref Range Status   Specimen Description   Final    BLOOD SITE NOT SPECIFIED Performed at [facility], [address], [place], [place] [address]    Special Requests   Final    BOTTLES DRAWN AEROBIC AND ANAEROBIC Blood Culture adequate volume Performed at [facility], [address],  [place], [place] [address]    Culture   Final    NO GROWTH 3 DAYS Performed at [facility], [address], [place], [place] [address]    Report Status PENDING  Incomplete  Blood culture (routine x 2)     Status: None (Preliminary result)   Collection Time: 06/14/22  9:20 PM   Specimen: BLOOD  Result Value Ref Range Status  Specimen Description   Final    BLOOD BLOOD LEFT HAND Performed at [facility] [address] [place], [place] [address]    Special Requests   Final    BOTTLES DRAWN AEROBIC AND ANAEROBIC Blood Culture results may not be optimal due to an inadequate volume of blood received in culture bottles Performed at [facility], [address], [place], [place] [address]    Culture   Final    NO GROWTH 3 DAYS Performed at [facility], [address], [place], [place] [address]    Report Status PENDING  Incomplete    Radiology Studies: No results found.    Erminio Nygard T. Kinzey Sheriff [facility]  If 7PM-7AM, please contact night-coverage [email or web address] 06/17/2022, 1:40 PM

## 2022-06-18 DIAGNOSIS — R0902 Hypoxemia: Secondary | ICD-10-CM | POA: Diagnosis not present

## 2022-06-18 DIAGNOSIS — J189 Pneumonia, unspecified organism: Secondary | ICD-10-CM | POA: Diagnosis not present

## 2022-06-18 LAB — CULTURE, BLOOD (ROUTINE X 2): Culture: NO GROWTH

## 2022-06-18 LAB — CBC
HCT: 40.6 % (ref 39.0–52.0)
Hemoglobin: 13 g/dL (ref 13.0–17.0)
MCH: 27.1 pg (ref 26.0–34.0)
MCHC: 32 g/dL (ref 30.0–36.0)
MCV: 84.6 fL (ref 80.0–100.0)
Platelets: 297 10*3/uL (ref 150–400)
RBC: 4.8 MIL/uL (ref 4.22–5.81)
RDW: 13.7 % (ref 11.5–15.5)
WBC: 7.2 10*3/uL (ref 4.0–10.5)
nRBC: 0 % (ref 0.0–0.2)

## 2022-06-18 LAB — RENAL FUNCTION PANEL
Albumin: 3.1 g/dL — ABNORMAL LOW (ref 3.5–5.0)
Anion gap: 8 (ref 5–15)
BUN: 14 mg/dL (ref 6–20)
CO2: 23 mmol/L (ref 22–32)
Calcium: 8.6 mg/dL — ABNORMAL LOW (ref 8.9–10.3)
Chloride: 106 mmol/L (ref 98–111)
Creatinine, Ser: 0.75 mg/dL (ref 0.61–1.24)
GFR, Estimated: >60 mL/min (ref >60)
Glucose, Bld: 129 mg/dL — ABNORMAL HIGH (ref 70–99)
Phosphorus: 3.5 mg/dL (ref 2.5–4.6)
Potassium: 4.6 mmol/L (ref 3.5–5.1)
Sodium: 137 mmol/L (ref 135–145)

## 2022-06-18 LAB — MAGNESIUM: Magnesium: 2.7 mg/dL — ABNORMAL HIGH (ref 1.7–2.4)

## 2022-06-18 NOTE — Progress Notes (Signed)
Mobility Specialist - Progress Note   06/18/22 1348  Oxygen Therapy  SpO2 92 %  O2 Device Nasal Cannula  O2 Flow Rate (L/min) 3 L/min  Mobility  Activity Ambulated with assistance in hallway  Level of Assistance Contact guard assist, steadying assist  Assistive Device Front wheel walker  Distance Ambulated (ft) 120 ft  Activity Response Tolerated well  Mobility Referral Yes  $Mobility charge 1 Mobility   Pt received in chair and agreed to mobility, pt was standby for both sit to stand and stand to sit.   During ambulation, pt had no issues, returned to chair with all needs met.  Marilynne Halsted Mobility Specialist

## 2022-06-18 NOTE — Progress Notes (Signed)
  Progress Note   Patient: Christian [name] ZOX:[id number] DOB: 10-Jan-1976 DOA: 06/14/2022     4 DOS: the patient was seen and examined on 06/18/2022   Brief hospital course: 47 year old M with PMH of morbid obesity, OSA on CPAP and 2 L at night, anxiety and depression, homelessness and allergic rhinitis presenting with productive cough and shortness of breath, and admitted for sepsis due to left lower lobe pneumonia with acute on chronic respiratory failure. Started on ceftriaxone and azithromycin. Of note, patient has not been able to use his CPAP since he became homeless.   Assessment and Plan: Acute on chronic respiratory failure with hypoxia due to LLL pneumonia: Required 4 L.  At baseline, only uses 2 L with CPAP at night. CT scan shows evidence of left lower lobe pneumonia.  Had leukocytosis but no other SIRS on presentation.  Spiked isolated fever the evening of 4/17.  Still with significant rhonchi on exam.  Sepsis ruled out. -Continue Rocephin/azithromycin, bronchodilators, IS, flutter valve.   -continue Solu-Medrol -Continued p.o. Lasix 40 mg daily -Strict intake and output, daily weight,  -renal functions and electrolytes -Wean O2 as tolerated   Chronic anxiety and depression: Stable -Continue home Zoloft   OSA on CPAP and 2 L at night: Reportedly has not been able to use his CPAP since he became homeless. -CPAP at night -TOC on board.   Hypokalemia -Monitor replenish as appropriate   Hypertension: Normotensive.   Homelessness -TOC consulted   Morbid obesity Body mass index is 47.27 kg/m.      Subjective: Still feeling somewhat sob  Physical Exam: Vitals:   06/18/22 0756 06/18/22 1205 06/18/22 1208 06/18/22 1348  BP:  (!) 167/125 (!) 141/81   Pulse:  64 67   Resp:  20    Temp:  (!) 97.5 F (36.4 C)    TempSrc:  Oral    SpO2: 94% 100%  92%  Weight:       General exam: Awake, laying in bed, in nad Respiratory system: Slightly increased respiratory effort,  no wheezing Cardiovascular system: regular rate, s1, s2 Gastrointestinal system: Soft, nondistended, positive BS Central nervous system: CN2-12 grossly intact, strength intact Extremities: Perfused, no clubbing Skin: Normal skin turgor, no notable skin lesions seen Psychiatry: Mood normal // no visual hallucinations   Data Reviewed:  Labs reviewed: Na 137, K 4.6, Cr 0.75   Family Communication: Pt in room, family not at bedside  Disposition: Status is: Inpatient Remains inpatient appropriate because: Severity of illness  Planned Discharge Destination: Home     Author: Rickey Barbara, MD 06/18/2022 4:24 PM  For on call review [email or web address].

## 2022-06-18 NOTE — Hospital Course (Signed)
47 year old M with PMH of morbid obesity, OSA on CPAP and 2 L at night, anxiety and depression, homelessness and allergic rhinitis presenting with productive cough and shortness of breath, and admitted for sepsis due to left lower lobe pneumonia with acute on chronic respiratory failure. Started on ceftriaxone and azithromycin. Of note, patient has not been able to use his CPAP since he became homeless.

## 2022-06-18 NOTE — Progress Notes (Signed)
[facility]

## 2022-06-18 NOTE — Evaluation (Signed)
Occupational Therapy Evaluation Patient Details Name: Christian [name] MRN: [id number] DOB: [birth year] Today's Date: 06/18/2022   History of Present Illness 47 yo male admitted to hospital on 06/14/2022 due to worsening productive cough, SOB and sore throat of the past few days. At presentation to ED pt was febrile, tachycardic with abn labs, and hypoxic requiring supplemental O2. CT revealed LLE infiltrate suggestive of PNA and new dx of sepsis secondary to CAP.  Pt PMH includes but is not limited to: CHF, anxiety, depression, pleural effusion, and OSA. Pt on CPAP however due to recent homelessness pt is unable to utilize.   Clinical Impression   Pt has been functioning independently and only using O2 with his CPAP at night until he was rendered homeless and was no longer able to use as he was sleeping on the floor of the [facility] x 1 week. Pt presents with decreased activity tolerance requiring 4L O2 to maintain SpO2 > 90%. He is overall functioning at a supervision level in mobility with RW and ADLs. He is likely to progress well and not require post acute OT.      Recommendations for follow up therapy are one component of a multi-disciplinary discharge planning process, led by the attending physician.  Recommendations may be updated based on patient status, additional functional criteria and insurance authorization.   Assistance Recommended at Discharge PRN  Patient can return home with the following Assist for transportation    Functional Status Assessment  Patient has had a recent decline in their functional status and demonstrates the ability to make significant improvements in function in a reasonable and predictable amount of time.  Equipment Recommendations  None recommended by OT    Recommendations for Other Services       Precautions / Restrictions Precautions Precautions: Fall Precaution Comments: watch O2 Restrictions Weight Bearing Restrictions: No      Mobility Bed  Mobility               General bed mobility comments: in chair    Transfers Overall transfer level: Needs assistance Equipment used: Rolling walker (2 wheels) Transfers: Sit to/from Stand Sit to Stand: Supervision           General transfer comment: cues for hand placement      Balance Overall balance assessment: Needs assistance Sitting-balance support: Feet supported Sitting balance-[name]: Good     Standing balance support: Bilateral upper extremity supported Standing balance-[name]: Poor Standing balance comment: fair static standing                           ADL either performed or assessed with clinical judgement   ADL Overall ADL's : Needs assistance/impaired Eating/Feeding: Independent;Sitting   Grooming: Oral care;Sitting;Supervision/safety   Upper Body Bathing: Set up;Sitting   Lower Body Bathing: Supervison/ safety;Sit to/from stand   Upper Body Dressing : Set up;Sitting   Lower Body Dressing: Set up;Sitting/lateral leans   Toilet Transfer: Supervision/safety;Ambulation;Rolling walker (2 wheels)           Functional mobility during ADLs: Supervision/safety;Rolling walker (2 wheels)       Vision Baseline Vision/History: 1 Wears glasses Ability to See in Adequate Light: 0 Adequate Patient Visual Report: No change from baseline       Perception     Praxis      Pertinent Vitals/Pain Pain Assessment Pain Assessment: No/denies pain     Hand Dominance Right   Extremity/Trunk Assessment Upper Extremity Assessment Upper  Extremity Assessment: Overall WFL for tasks assessed   Lower Extremity Assessment Lower Extremity Assessment: Defer to PT evaluation   Cervical / Trunk Assessment Cervical / Trunk Assessment: Normal   Communication Communication Communication: No difficulties   Cognition Arousal/Alertness: Awake/alert Behavior During Therapy: WFL for tasks assessed/performed Overall Cognitive Status:  Within Functional Limits for tasks assessed                                 General Comments: verbose     General Comments       Exercises     Shoulder Instructions      Home Living Family/patient expects to be discharged to:: Shelter/Homeless                                        Prior Functioning/Environment Prior Level of Function : Independent/Modified Independent                        OT Problem List: Impaired balance (sitting and/or standing);Obesity;Cardiopulmonary status limiting activity      OT Treatment/Interventions: Self-care/ADL training;DME and/or AE instruction;Balance training;Therapeutic activities;Energy conservation    OT Goals(Current goals can be found in the care plan section) Acute Rehab OT Goals OT Goal Formulation: With patient Time For Goal Achievement: 07/01/22 Potential to Achieve Goals: Good ADL Goals Pt Will Transfer to Toilet: Independently;ambulating;regular height toilet Pt Will Perform Toileting - Clothing Manipulation and hygiene: Independently;sit to/from stand;sitting/lateral leans Additional ADL Goal #1: Pt with generalize energy conservation strategies in ADLs and mobility. Additional ADL Goal #2: Pt will complete basic ADLs modified independently.  OT Frequency: Min 2X/week    Co-evaluation              AM-PAC OT "6 Clicks" Daily Activity     Outcome Measure Help from another person eating meals?: None Help from another person taking care of personal grooming?: A Little Help from another person toileting, which includes using toliet, bedpan, or urinal?: A Little Help from another person bathing (including washing, rinsing, drying)?: A Little Help from another person to put on and taking off regular upper body clothing?: A Little Help from another person to put on and taking off regular lower body clothing?: A Little 6 Click Score: 19   End of Session Equipment Utilized During  Treatment: Rolling walker (2 wheels);Oxygen (4L)  Activity Tolerance: Patient tolerated treatment well Patient left: in chair;with call bell/phone within reach;with nursing/sitter in room  OT Visit Diagnosis: Unsteadiness on feet (R26.81) (decreased activity tolerance)                Time: 7829-5621 OT Time Calculation (min): 30 min Charges:  OT General Charges $OT Visit: 1 Visit OT Evaluation $OT Eval Low Complexity: 1 Low OT Treatments $Self Care/Home Management : 8-22 mins  Berna Spare, OTR/L Acute Rehabilitation Services Office: [phone]   Evern Bio 06/18/2022, 12:59 PM

## 2022-06-19 ENCOUNTER — Encounter (HOSPITAL_COMMUNITY): Payer: Self-pay | Admitting: Internal Medicine

## 2022-06-19 DIAGNOSIS — J189 Pneumonia, unspecified organism: Secondary | ICD-10-CM | POA: Diagnosis not present

## 2022-06-19 DIAGNOSIS — R0902 Hypoxemia: Secondary | ICD-10-CM | POA: Diagnosis not present

## 2022-06-19 LAB — CULTURE, BLOOD (ROUTINE X 2): Culture: NO GROWTH

## 2022-06-19 MED ORDER — FUROSEMIDE 10 MG/ML IJ SOLN
40.0000 mg | Freq: Every day | INTRAMUSCULAR | Status: DC
  Administered 2022-06-19 – 2022-06-21 (×3): 40 mg via INTRAVENOUS
  Filled 2022-06-19 (×3): qty 4

## 2022-06-19 NOTE — Progress Notes (Signed)
  Progress Note   Patient: Christian [name] AOZ:[id number] DOB: 1975-11-29 DOA: 06/14/2022     5 DOS: the patient was seen and examined on 06/19/2022   Brief hospital course: 47 year old M with PMH of morbid obesity, OSA on CPAP and 2 L at night, anxiety and depression, homelessness and allergic rhinitis presenting with productive cough and shortness of breath, and admitted for sepsis due to left lower lobe pneumonia with acute on chronic respiratory failure. Started on ceftriaxone and azithromycin. Of note, patient has not been able to use his CPAP since he became homeless.   Assessment and Plan: Acute on chronic respiratory failure with hypoxia due to LLL pneumonia: Required 4 L.  At baseline, only uses 2 L with CPAP at night. CT scan shows evidence of left lower lobe pneumonia.  Had leukocytosis but no other SIRS on presentation.  Spiked isolated fever the evening of 4/17.  Still with significant rhonchi on exam.  Sepsis ruled out. -Continue Rocephin/azithromycin, bronchodilators, IS, flutter valve.   -continue Solu-Medrol -Continued p.o. Lasix 40 mg daily. Will increase to  IV -Strict intake and output, daily weight,  -recheck bmet in AM -O2 weaned to 3L today   Chronic anxiety and depression: Stable -Continue home Zoloft   OSA on CPAP and 2 L at night: Reportedly has not been able to use his CPAP since he became homeless. -CPAP at night -TOC on board.   Hypokalemia -Monitor replenish as appropriate   Hypertension: Normotensive.   Homelessness -TOC consulted   Morbid obesity Body mass index is 47.27 kg/m.      Subjective: Reports feeling somewhat better. Still mildly sob, voiding well  Physical Exam: Vitals:   06/19/22 0424 06/19/22 0709 06/19/22 0835 06/19/22 1238  BP: 119/64   119/66  Pulse: 66   81  Resp: 20   20  Temp: 97.6 F (36.4 C)   97.7 F (36.5 C)  TempSrc:    Oral  SpO2: 95%  98% 95%  Weight:  (!) 176.6 kg     General exam: Conversant, in no  acute distress Respiratory system: normal chest rise, clear, no audible wheezing Cardiovascular system: regular rhythm, s1-s2 Gastrointestinal system: Nondistended, nontender, pos BS Central nervous system: No seizures, no tremors Extremities: No cyanosis, no joint deformities Skin: No rashes, no pallor Psychiatry: Affect normal // no auditory hallucinations   Data Reviewed:  There are no new results to review at this time.  Family Communication: Pt in room, family not at bedside  Disposition: Status is: Inpatient Remains inpatient appropriate because: Severity of illness  Planned Discharge Destination: Home     Author: Rickey Barbara, MD 06/19/2022 4:55 PM  For on call review [email or web address].

## 2022-06-19 NOTE — Progress Notes (Signed)
Akeel sat up in the chair all day.

## 2022-06-20 DIAGNOSIS — R0902 Hypoxemia: Secondary | ICD-10-CM | POA: Diagnosis not present

## 2022-06-20 DIAGNOSIS — J189 Pneumonia, unspecified organism: Secondary | ICD-10-CM | POA: Diagnosis not present

## 2022-06-20 LAB — COMPREHENSIVE METABOLIC PANEL
ALT: 24 U/L (ref 0–44)
AST: 18 U/L (ref 15–41)
Albumin: 3 g/dL — ABNORMAL LOW (ref 3.5–5.0)
Alkaline Phosphatase: 54 U/L (ref 38–126)
Anion gap: 9 (ref 5–15)
BUN: 21 mg/dL — ABNORMAL HIGH (ref 6–20)
CO2: 27 mmol/L (ref 22–32)
Calcium: 8.5 mg/dL — ABNORMAL LOW (ref 8.9–10.3)
Chloride: 102 mmol/L (ref 98–111)
Creatinine, Ser: 0.84 mg/dL (ref 0.61–1.24)
GFR, Estimated: >60 mL/min (ref >60)
Glucose, Bld: 127 mg/dL — ABNORMAL HIGH (ref 70–99)
Potassium: 4.3 mmol/L (ref 3.5–5.1)
Sodium: 138 mmol/L (ref 135–145)
Total Bilirubin: 0.3 mg/dL (ref 0.3–1.2)
Total Protein: 6.4 g/dL — ABNORMAL LOW (ref 6.5–8.1)

## 2022-06-20 NOTE — TOC Progression Note (Signed)
Transition of Care [facility]) - Progression Note    Patient Details  Name: Christian [name] MRN: [id number] Date of Birth: 1975/09/21  Transition of Care [facility]) CM/SW Contact  Otelia Santee, LCSW Phone Number: 06/20/2022, 1:13 PM  Clinical Narrative:    Met with pt who shares that he recently became homeless following being kicked out of a boarding house. Pt's shares he was sent to boarding house following hospitalization at [facility] in 01/2021. [facility] provided 30-day LOG for pt to stay at boarding house however, following this pt was unable to pay for full monthly rent. Pt was at motel for 2 weeks in which, his friend paid for his room. Pt was then staying at [facility] shelter for 5 days prior to current hospital admission. Pt received a bariatric RW through [employer] in 01/2021 from [facility]. However, pt shares he no longer felt he needed the RW and left it at the boarding house to be donated. Pt uses 2L O2 qhs and has CPAP that is supposed to be used, however since being homeless is unable to use. Pt shares his CPAP and other belongings are at his friends house in [place]. He has church friends who are supportive and has an older sister in [place] that is able to assist occasionally.  Pt shares he does not have a job and has no income. He has recently applied for disability income. He shares he has also applied for housing through [place] however, has to redo application due to not uploading requested documents. Additional housing resources have been provided to pt.      Expected Discharge Plan: Homeless Shelter Barriers to Discharge: Continued Medical Work up  Expected Discharge Plan and Services In-house Referral: NA Discharge Planning Services: NA Post Acute Care Choice: NA Living arrangements for the past 2 months: Homeless Shelter, Hotel/Motel                 DME Arranged: N/A DME Agency: NA                   Social Determinants of Health (SDOH) Interventions SDOH Screenings   Food  Insecurity: No Food Insecurity (06/19/2022)  Housing: Low Risk  (06/19/2022)  Recent Concern: Housing - Medium Risk (05/11/2022)  Transportation Needs: No Transportation Needs (06/19/2022)  Utilities: Not At Risk (06/19/2022)  Alcohol Screen: Low Risk  (12/24/2020)  Depression (PHQ2-9): Medium Risk (05/11/2022)  Financial Resource Strain: High Risk (05/11/2022)  Physical Activity: Insufficiently Active (05/11/2022)  Social Connections: Moderately Isolated (05/11/2022)  Stress: Stress Concern Present (05/11/2022)  Tobacco Use: Low Risk  (06/19/2022)    Readmission Risk Interventions    06/16/2022    1:32 PM 10/11/2020   12:09 PM  Readmission Risk Prevention Plan  Post Dischage Appt  Complete  Medication Screening  Complete  Transportation Screening Complete Complete  PCP or Specialist Appt within 5-7 Days Complete   Home Care Screening Complete   Medication Review (RN CM) Complete

## 2022-06-20 NOTE — Progress Notes (Signed)
Occupational Therapy Treatment Patient Details Name: Christian [name] MRN: [id number] DOB: [birth year] Today's Date: 06/20/2022   History of present illness 47 yo male admitted to hospital on 06/14/2022 due to worsening productive cough, SOB and sore throat of the past few days. At presentation to ED pt was febrile, tachycardic with abn labs, and hypoxic requiring supplemental O2. CT revealed LLE infiltrate suggestive of PNA and new dx of sepsis secondary to CAP.  Pt PMH includes but is not limited to: CHF, anxiety, depression, pleural effusion, and OSA. Pt on CPAP however due to recent homelessness pt is unable to utilize.   OT comments  Patient progressing and showed improved activity tolerance, scoring 9 rep on 30 second Sit to Stand test which was modified to allow pt to use his Ues for safety and balance.  Pt described effort of test as a 6/10 on RPE and was visibly winded after test with cues to rest. Pt educated on general principles of energy conservation and left with 2 handouts on topic which pt agreed to read.  Patient remains limited by being without a current home and discharge plan, generalized weakness and decreased activity tolerance along with deficits noted below. Pt is motivated and continues to demonstrate good rehab potential and would benefit from continued skilled OT to increase safety and independence with ADLs and functional transfers to allow pt to return home safely and reduce caregiver burden and fall risk.    Recommendations for follow up therapy are one component of a multi-disciplinary discharge planning process, led by the attending physician.  Recommendations may be updated based on patient status, additional functional criteria and insurance authorization.    Assistance Recommended at Discharge PRN  Patient can return home with the following  Assist for transportation   Equipment Recommendations  None recommended by OT    Recommendations for Other Services       Precautions / Restrictions Precautions Precautions: Fall Precaution Comments: watch O2 Restrictions Weight Bearing Restrictions: No       Mobility Bed Mobility               General bed mobility comments: in recliner when OT arrived    Transfers                         Balance               Standing balance comment: Pt completed 30 Seconds Sit to stand Test, modified to allow for use of UEs and standing to RW in case needed for balance. Pt scored a 9, indicating a higher than normal fall risk for his age and gender. SpO2 after test 98% on 3L with HR: 95.  With each sit<>Stand pt received close supervision or Min guard assist to lightly steady when coming fully upright.                           ADL either performed or assessed with clinical judgement   ADL Overall ADL's : Needs assistance/impaired       Grooming Details (indicate cue type and reason): Pt reports grooming alraedy completed.         Upper Body Dressing : Sitting;Set up   Lower Body Dressing: Set up;Sitting/lateral leans;Cueing for safety Lower Body Dressing Details (indicate cue type and reason): Pt able to complete figure 4 postion in recliner and doff and don each sock with cues for pacing to catch  breath in between LEs.   Toilet Transfer Details (indicate cue type and reason): Pt declined need of BSC. Pt did agree to 30 second Sit to Stand from recliner. Please refer to Balance section of notes Toileting- Clothing Manipulation and Hygiene: Set up Toileting - Clothing Manipulation Details (indicate cue type and reason): Using urinal without need of assistance.     Functional mobility during ADLs: Supervision/safety;Rolling walker (2 wheels)      Extremity/Trunk Assessment Upper Extremity Assessment Upper Extremity Assessment: Overall WFL for tasks assessed   Lower Extremity Assessment Lower Extremity Assessment: Generalized weakness   Cervical / Trunk  Assessment Cervical / Trunk Assessment: Normal;Other exceptions Cervical / Trunk Exceptions: Body habitus    Vision Baseline Vision/History: 1 Wears glasses Ability to See in Adequate Light: 0 Adequate Patient Visual Report: No change from baseline     Perception     Praxis      Cognition Arousal/Alertness: Awake/alert Behavior During Therapy: WFL for tasks assessed/performed Overall Cognitive Status: Within Functional Limits for tasks assessed                                 General Comments: verbose. Possible mild developmental delay(?) Very pleasant.        Exercises Other Exercises Other Exercises: Pt provided with energy conservation handouts and discussion on main topics. Pt agreed to read on his own .    Shoulder Instructions       General Comments      Pertinent Vitals/ Pain       Pain Assessment Pain Assessment: No/denies pain  Home Living Family/patient expects to be discharged to:: Shelter/Homeless Living Arrangements: Other (Comment)                                      Prior Functioning/Environment              Frequency  Min 2X/week        Progress Toward Goals  OT Goals(current goals can now be found in the care plan section)  Progress towards OT goals: Progressing toward goals  Acute Rehab OT Goals OT Goal Formulation: With patient Time For Goal Achievement: 07/01/22 Potential to Achieve Goals: Good  Plan Discharge plan remains appropriate    Co-evaluation                 AM-PAC OT "6 Clicks" Daily Activity     Outcome Measure   Help from another person eating meals?: None Help from another person taking care of personal grooming?: A Little Help from another person toileting, which includes using toliet, bedpan, or urinal?: A Little Help from another person bathing (including washing, rinsing, drying)?: A Little Help from another person to put on and taking off regular upper body  clothing?: A Little Help from another person to put on and taking off regular lower body clothing?: A Little 6 Click Score: 19    End of Session Equipment Utilized During Treatment: Oxygen;Rolling walker (2 wheels)  OT Visit Diagnosis: Unsteadiness on feet (R26.81)   Activity Tolerance Patient tolerated treatment well   Patient Left in chair;with call bell/phone within reach;with nursing/sitter in room   Nurse Communication          Time: 1114-1130 OT Time Calculation (min): 16 min  Charges: OT General Charges $OT Visit: 1 Visit OT Treatments $Self Care/Home  Management : 8-22 mins  Victorino Dike, [place] [facility] Office: [phone] 06/20/2022  Theodoro Clock 06/20/2022, 12:13 PM

## 2022-06-20 NOTE — Plan of Care (Signed)

## 2022-06-20 NOTE — Progress Notes (Signed)
  Progress Note   Patient: Christian [name] WRU:[id number] DOB: 10/13/1975 DOA: 06/14/2022     6 DOS: the patient was seen and examined on 06/20/2022   Brief hospital course: 47 year old M with PMH of morbid obesity, OSA on CPAP and 2 L at night, anxiety and depression, homelessness and allergic rhinitis presenting with productive cough and shortness of breath, and admitted for sepsis due to left lower lobe pneumonia with acute on chronic respiratory failure. Started on ceftriaxone and azithromycin. Of note, patient has not been able to use his CPAP since he became homeless.   Assessment and Plan: Acute on chronic respiratory failure with hypoxia due to LLL pneumonia: Required 4 L.  At baseline, only uses 2 L with CPAP at night. CT scan shows evidence of left lower lobe pneumonia.  Had leukocytosis but no other SIRS on presentation.  Spiked isolated fever the evening of 4/17.  Still with significant rhonchi on exam.  Sepsis ruled out. -Continue Rocephin/azithromycin, bronchodilators, IS, flutter valve.   -continue Solu-Medrol -Continued p.o. Lasix 40 mg daily. Will increase to  IV bid -diuresing well -Cont to wean O2 as tolerated to goal of home o2 requirement   Chronic anxiety and depression: Stable -Continue home Zoloft   OSA on CPAP and 2 L at night: Reportedly has not been able to use his CPAP since he became homeless. -CPAP at night -TOC on board.   Hypokalemia -Monitor replenish as appropriate   Hypertension: Normotensive.   Homelessness -TOC consulted   Morbid obesity Body mass index is 47.27 kg/m.      Subjective: States breathing better since starting IV lasix  Physical Exam: Vitals:   06/20/22 0259 06/20/22 0333 06/20/22 0836 06/20/22 1304  BP:  121/69  137/63  Pulse:  64  77  Resp:  14  18  Temp:  97.7 F (36.5 C)  97.6 F (36.4 C)  TempSrc:  Oral  Oral  SpO2:  96% 97% 96%  Weight: (!) 175 kg     Height:       General exam: Awake, laying in bed, in  nad Respiratory system: Normal respiratory effort, no wheezing Cardiovascular system: regular rate, s1, s2 Gastrointestinal system: Soft, nondistended, positive BS Central nervous system: CN2-12 grossly intact, strength intact Extremities: Perfused, no clubbing Skin: Normal skin turgor, no notable skin lesions seen Psychiatry: Mood normal // no visual hallucinations   Data Reviewed:  Labs reviewed: Na 138, K 4.3, Cr 0.84  Family Communication: Pt in room, family not at bedside  Disposition: Status is: Inpatient Remains inpatient appropriate because: Severity of illness  Planned Discharge Destination: Home     Author: Rickey Barbara, MD 06/20/2022 3:31 PM  For on call review [email or web address].

## 2022-06-20 NOTE — Progress Notes (Signed)
Physical Therapy Treatment Patient Details Name: Christian [name] MRN: [id number] DOB: 02/11/1976 Today's Date: 06/20/2022   History of Present Illness 47 yo male admitted to hospital on 06/14/2022 due to worsening productive cough, SOB and sore throat of the past few days. At presentation to ED pt was febrile, tachycardic with abn labs, and hypoxic requiring supplemental O2. CT revealed LLE infiltrate suggestive of PNA and new dx of sepsis secondary to CAP.  Pt PMH includes but is not limited to: CHF, anxiety, depression, pleural effusion, and OSA. Pt on CPAP however due to recent homelessness pt is unable to utilize.    PT Comments     Pt admitted with above diagnosis.  Pt currently with functional limitations due to the deficits listed below (see PT Problem List). Pt seated in recliner when PT arrived. Pt was eager to ambulate today. Pt reports improved breathing and now on 3 L/min vs 4 L/min at eval. Pt demonstrated improved activity tolerance and I with mobility tasks and is progressing toward PT and personal goals. Pt required S for STS from recliner with min cues, gait 130 feet with RW, min guard to close S cues for pursed lip breathing with O2 saturation monitored t/o 96%-99% on 3 L/min. Pt able to participate with static standing balance challenges without UE support no LOB and cues for extension posture with wide BOS. Pt indicated mild SOB with gait tasks, no pain or dizziness. Pt left seated in recliner and all needs met.  Pt will benefit from acute skilled PT to increase their independence and safety with mobility to allow discharge.     Recommendations for follow up therapy are one component of a multi-disciplinary discharge planning process, led by the attending physician.  Recommendations may be updated based on patient status, additional functional criteria and insurance authorization.  Follow Up Recommendations       Assistance Recommended at Discharge Intermittent  Supervision/Assistance  Patient can return home with the following A little help with walking and/or transfers;A little help with bathing/dressing/bathroom;Assistance with cooking/housework;Assist for transportation;Help with stairs or ramp for entrance   Equipment Recommendations  Rolling walker (2 wheels) (may beneift from bariatric)    Recommendations for Other Services       Precautions / Restrictions Precautions Precautions: Fall Precaution Comments: watch O2 Restrictions Weight Bearing Restrictions: No     Mobility  Bed Mobility               General bed mobility comments: in recliner when PT arrived    Transfers Overall transfer level: Needs assistance Equipment used: Rolling walker (2 wheels) Transfers: Sit to/from Stand Sit to Stand: Supervision           General transfer comment: cues for hand placement    Ambulation/Gait Ambulation/Gait assistance: Supervision Gait Distance (Feet): 130 Feet Assistive device: Rolling walker (2 wheels) Gait Pattern/deviations: Step-to pattern, Wide base of support, Trunk flexed Gait velocity: decreased     General Gait Details: O2 3 L/min and pt maintained 96-99% t/o intervention; decreased L heel strike no c/o L knee pain today   Stairs             Wheelchair Mobility    Modified Rankin (Stroke Patients Only)       Balance Overall balance assessment: Needs assistance Sitting-balance support: Feet supported Sitting balance-[name]: Good     Standing balance support: Bilateral upper extremity supported, During functional activity (static standing balance challenges no UE support) Standing balance-[name]: Fair Standing balance comment:  fair static standing                            Cognition Arousal/Alertness: Awake/alert Behavior During Therapy: WFL for tasks assessed/performed Overall Cognitive Status: Within Functional Limits for tasks assessed                                  General Comments: verbose        Exercises      General Comments        Pertinent Vitals/Pain Pain Assessment Pain Assessment: No/denies pain    Home Living Family/patient expects to be discharged to:: Shelter/Homeless Living Arrangements: Other (Comment)                      Prior Function            PT Goals (current goals can now be found in the care plan section) Acute Rehab PT Goals Patient Stated Goal: to find a place to stay I can use the CPAP PT Goal Formulation: With patient Time For Goal Achievement: 07/01/22 Potential to Achieve Goals: Good    Frequency    Min 1X/week      PT Plan      Co-evaluation              AM-PAC PT "6 Clicks" Mobility   Outcome Measure  Help needed turning from your back to your side while in a flat bed without using bedrails?: None Help needed moving from lying on your back to sitting on the side of a flat bed without using bedrails?: None Help needed moving to and from a bed to a chair (including a wheelchair)?: A Little Help needed standing up from a chair using your arms (e.g., wheelchair or bedside chair)?: A Little Help needed to walk in hospital room?: A Little Help needed climbing 3-5 steps with a railing? : A Lot 6 Click Score: 19    End of Session Equipment Utilized During Treatment: Gait belt Activity Tolerance: Patient limited by fatigue Patient left: in chair;with call bell/phone within reach Nurse Communication: Mobility status;Other (comment) (maintained O2 saturation >/=96% on 3 L/min) PT Visit Diagnosis: Unsteadiness on feet (R26.81);Other abnormalities of gait and mobility (R26.89);Muscle weakness (generalized) (M62.81);Difficulty in walking, not elsewhere classified (R26.2)     Time: 1610-9604 PT Time Calculation (min) (ACUTE ONLY): 27 min  Charges:  $Gait Training: 8-22 mins $Neuromuscular Re-education: 8-22 mins                     Rica Mote,  PT    Jacqualyn Posey 06/20/2022, 11:01 AM

## 2022-06-21 DIAGNOSIS — R0902 Hypoxemia: Secondary | ICD-10-CM | POA: Diagnosis not present

## 2022-06-21 DIAGNOSIS — J189 Pneumonia, unspecified organism: Secondary | ICD-10-CM | POA: Diagnosis not present

## 2022-06-21 LAB — CBC
HCT: 39.9 % (ref 39.0–52.0)
Hemoglobin: 12.7 g/dL — ABNORMAL LOW (ref 13.0–17.0)
MCH: 27 pg (ref 26.0–34.0)
MCHC: 31.8 g/dL (ref 30.0–36.0)
MCV: 84.9 fL (ref 80.0–100.0)
Platelets: 329 10*3/uL (ref 150–400)
RBC: 4.7 MIL/uL (ref 4.22–5.81)
RDW: 13.8 % (ref 11.5–15.5)
WBC: 10.1 10*3/uL (ref 4.0–10.5)
nRBC: 0 % (ref 0.0–0.2)

## 2022-06-21 LAB — COMPREHENSIVE METABOLIC PANEL
ALT: 26 U/L (ref 0–44)
AST: 15 U/L (ref 15–41)
Albumin: 3.2 g/dL — ABNORMAL LOW (ref 3.5–5.0)
Alkaline Phosphatase: 56 U/L (ref 38–126)
Anion gap: 10 (ref 5–15)
BUN: 20 mg/dL (ref 6–20)
CO2: 27 mmol/L (ref 22–32)
Calcium: 8.5 mg/dL — ABNORMAL LOW (ref 8.9–10.3)
Chloride: 101 mmol/L (ref 98–111)
Creatinine, Ser: 0.92 mg/dL (ref 0.61–1.24)
GFR, Estimated: >60 mL/min (ref >60)
Glucose, Bld: 117 mg/dL — ABNORMAL HIGH (ref 70–99)
Potassium: 4.4 mmol/L (ref 3.5–5.1)
Sodium: 138 mmol/L (ref 135–145)
Total Bilirubin: 0.3 mg/dL (ref 0.3–1.2)
Total Protein: 6.4 g/dL — ABNORMAL LOW (ref 6.5–8.1)

## 2022-06-21 MED ORDER — FUROSEMIDE 10 MG/ML IJ SOLN
40.0000 mg | Freq: Two times a day (BID) | INTRAMUSCULAR | Status: DC
  Administered 2022-06-21 – 2022-06-22 (×3): 40 mg via INTRAVENOUS
  Filled 2022-06-21 (×3): qty 4

## 2022-06-21 NOTE — Plan of Care (Signed)

## 2022-06-21 NOTE — Progress Notes (Signed)
  Progress Note   Patient: Christian [name] ZOX:[id number] DOB: 12-10-1975 DOA: 06/14/2022     7 DOS: the patient was seen and examined on 06/21/2022   Brief hospital course: 47 year old M with PMH of morbid obesity, OSA on CPAP and 2 L at night, anxiety and depression, homelessness and allergic rhinitis presenting with productive cough and shortness of breath, and admitted for sepsis due to left lower lobe pneumonia with acute on chronic respiratory failure. Started on ceftriaxone and azithromycin. Of note, patient has not been able to use his CPAP since he became homeless.   Assessment and Plan: Acute on chronic respiratory failure with hypoxia due to LLL pneumonia: Required 4 L.  At baseline, only uses 2 L with CPAP at night and room air during the day, per pt.  CT scan shows evidence of left lower lobe pneumonia.  Had leukocytosis but no other SIRS on presentation.  Spiked isolated fever the evening of 4/17.  Still with significant rhonchi on exam.  Sepsis ruled out. -Continue Rocephin/azithromycin, bronchodilators, IS, flutter valve.   -continue Solu-Medrol -Tolerating IV lasix with some improvement. Pt voiding well, however he is drinking PO fluids w/o limitation -Will increase lasix to BID, place pt on 1500cc fluid restriction -Cont to wean O2 as tolerated to goal of RA if possible   Chronic anxiety and depression: Stable -Continue home Zoloft   OSA on CPAP and 2 L at night: Reportedly has not been able to use his CPAP since he became homeless. -CPAP at night -TOC on board.   Hypokalemia -Monitor replenish as appropriate   Hypertension: Normotensive.   Homelessness -TOC consulted   Morbid obesity Body mass index is 47.27 kg/m.      Subjective: Reports breathing better somewhat, but still O2 dependent  Physical Exam: Vitals:   06/20/22 1945 06/21/22 0502 06/21/22 0847 06/21/22 1239  BP: 128/71 128/77  123/65  Pulse: 65 65  69  Resp: Temp: 98.8 F (37.1 C)  (!) 97.4 F (36.3 C)  97.8 F (36.6 C)  TempSrc:  Oral  Oral  SpO2: 94% 97% 98% 97%  Weight:      Height:       General exam: Conversant, in no acute distress Respiratory system: normal chest rise, clear, no audible wheezing Cardiovascular system: regular rhythm, s1-s2 Gastrointestinal system: Nondistended, nontender, pos BS Central nervous system: No seizures, no tremors Extremities: No cyanosis, no joint deformities Skin: No rashes, no pallor Psychiatry: Affect normal // no auditory hallucinations   Data Reviewed:  Labs reviewed: Na 138, K 4.4, Cr 0.92  Family Communication: Pt in room, family not at bedside  Disposition: Status is: Inpatient Remains inpatient appropriate because: Severity of illness  Planned Discharge Destination: Home     Author: Rickey Barbara, MD 06/21/2022 4:12 PM  For on call review [email or web address].

## 2022-06-21 NOTE — Progress Notes (Signed)
Physical Therapy Treatment Patient Details Name: Christian [name] MRN: [id number] DOB: [birth year] Today's Date: 06/21/2022   History of Present Illness 47 yo male admitted to hospital on 06/14/2022 due to worsening productive cough, SOB and sore throat of the past few days. At presentation to ED pt was febrile, tachycardic with abn labs, and hypoxic requiring supplemental O2. CT revealed LLE infiltrate suggestive of PNA and new dx of sepsis secondary to CAP.  Pt PMH includes but is not limited to: CHF, anxiety, depression, pleural effusion, and OSA. Pt on CPAP however due to recent homelessness pt is unable to utilize.    PT Comments     Pt admitted with above diagnosis.  Pt currently with functional limitations due to the deficits listed below (see PT Problem List). Pt seated in recliner when PT arrived. Pt reported he is feeling much better and the lasix is working because his weight is down and he is breathing better. Pt on 3 L/min at rest and 98-99% and no reports of SOB. Pt is S for transfer tasks, pt is demonstrating improved static standing balance with NMR challenges reaching outside BOS and no evidence of LOB. Pt is improving with activity tolerance with progress to 180 feet with RW and S with min cues for posture and pursed lip breathing and pt able to "pace" himself. O2 saturation on 3 L/min with exertion 97-99% and trial with gait 40 feet with 2 L/min and pt O2 saturation decreased to 91% with pt report of SOB. Pt returned to recliner, on 3 L/min O2 98% and all needs in place. Pt will benefit from acute skilled PT to increase their independence and safety with mobility to allow discharge.     Recommendations for follow up therapy are one component of a multi-disciplinary discharge planning process, led by the attending physician.  Recommendations may be updated based on patient status, additional functional criteria and insurance authorization.  Follow Up Recommendations        Assistance Recommended at Discharge Intermittent Supervision/Assistance  Patient can return home with the following A little help with walking and/or transfers;A little help with bathing/dressing/bathroom;Assistance with cooking/housework;Assist for transportation;Help with stairs or ramp for entrance   Equipment Recommendations  Rolling walker (2 wheels) (may beneift from bariatric)    Recommendations for Other Services       Precautions / Restrictions Precautions Precautions: Fall Precaution Comments: watch O2 Restrictions Weight Bearing Restrictions: No     Mobility  Bed Mobility               General bed mobility comments: in recliner when OT arrived    Transfers Overall transfer level: Needs assistance Equipment used: Rolling walker (2 wheels) Transfers: Sit to/from Stand Sit to Stand: Supervision           General transfer comment: cues for hand placement    Ambulation/Gait Ambulation/Gait assistance: Supervision Gait Distance (Feet): 180 Feet Assistive device: Rolling walker (2 wheels) Gait Pattern/deviations: Step-to pattern, Wide base of support, Trunk flexed Gait velocity: decreased     General Gait Details: O2 3 L/min and pt maintained 97-99% t/o intervention with pt performing pursed lip breathing with minimal cues. trial for 40 feet on 2 L/min with pt reporting increased SOB and O2 saturation 91-95%; decreased L heel strike no c/o L knee pain today   Stairs             Wheelchair Mobility    Modified Rankin (Stroke Patients Only)  Balance Overall balance assessment: Needs assistance Sitting-balance support: Feet supported Sitting balance-[name]: Good     Standing balance support: Bilateral upper extremity supported, During functional activity (static standing balance challenges no UE support) Standing balance-[name]: Fair Standing balance comment: static standing no UE support and able to reach outside BOS and  cross midline wide BOS and no instabiltiy noted                            Cognition Arousal/Alertness: Awake/alert Behavior During Therapy: WFL for tasks assessed/performed Overall Cognitive Status: Within Functional Limits for tasks assessed                                 General Comments: verbose. Possible mild developmental delay(?) Very pleasant.        Exercises      General Comments        Pertinent Vitals/Pain Pain Assessment Pain Assessment: No/denies pain    Home Living Family/patient expects to be discharged to:: Shelter/Homeless Living Arrangements: Other (Comment)                      Prior Function            PT Goals (current goals can now be found in the care plan section) Acute Rehab PT Goals Patient Stated Goal: to find a place to stay I can use the CPAP PT Goal Formulation: With patient Time For Goal Achievement: 07/01/22 Potential to Achieve Goals: Good    Frequency    Min 1X/week      PT Plan      Co-evaluation              AM-PAC PT "6 Clicks" Mobility   Outcome Measure  Help needed turning from your back to your side while in a flat bed without using bedrails?: None Help needed moving from lying on your back to sitting on the side of a flat bed without using bedrails?: None Help needed moving to and from a bed to a chair (including a wheelchair)?: A Little Help needed standing up from a chair using your arms (e.g., wheelchair or bedside chair)?: A Little Help needed to walk in hospital room?: A Little Help needed climbing 3-5 steps with a railing? : A Lot 6 Click Score: 19    End of Session Equipment Utilized During Treatment: Gait belt Activity Tolerance: Patient limited by fatigue Patient left: in chair;with call bell/phone within reach Nurse Communication: Mobility status;Other (comment) (maintained O2 saturation >/=97% on 3 L/min and trial with gait tasks on 2 L/min with pt  maintaining >/=91%) PT Visit Diagnosis: Unsteadiness on feet (R26.81);Other abnormalities of gait and mobility (R26.89);Muscle weakness (generalized) (M62.81);Difficulty in walking, not elsewhere classified (R26.2)     Time: 1040-1101 PT Time Calculation (min) (ACUTE ONLY): 21 min  Charges:  $Gait Training: 8-22 mins                     Rica Mote, PT    Jacqualyn Posey 06/21/2022, 11:36 AM

## 2022-06-22 ENCOUNTER — Inpatient Hospital Stay (HOSPITAL_COMMUNITY): Payer: Medicaid Other

## 2022-06-22 DIAGNOSIS — R0602 Shortness of breath: Secondary | ICD-10-CM | POA: Diagnosis not present

## 2022-06-22 DIAGNOSIS — J189 Pneumonia, unspecified organism: Secondary | ICD-10-CM | POA: Diagnosis not present

## 2022-06-22 LAB — CBC
HCT: 41.2 % (ref 39.0–52.0)
Hemoglobin: 13.2 g/dL (ref 13.0–17.0)
MCH: 27 pg (ref 26.0–34.0)
MCHC: 32 g/dL (ref 30.0–36.0)
MCV: 84.3 fL (ref 80.0–100.0)
Platelets: 344 10*3/uL (ref 150–400)
RBC: 4.89 MIL/uL (ref 4.22–5.81)
RDW: 13.9 % (ref 11.5–15.5)
WBC: 12.2 10*3/uL — ABNORMAL HIGH (ref 4.0–10.5)
nRBC: 0 % (ref 0.0–0.2)

## 2022-06-22 LAB — COMPREHENSIVE METABOLIC PANEL
ALT: 23 U/L (ref 0–44)
AST: 13 U/L — ABNORMAL LOW (ref 15–41)
Albumin: 3.3 g/dL — ABNORMAL LOW (ref 3.5–5.0)
Alkaline Phosphatase: 60 U/L (ref 38–126)
Anion gap: 9 (ref 5–15)
BUN: 24 mg/dL — ABNORMAL HIGH (ref 6–20)
CO2: 28 mmol/L (ref 22–32)
Calcium: 8.8 mg/dL — ABNORMAL LOW (ref 8.9–10.3)
Chloride: 98 mmol/L (ref 98–111)
Creatinine, Ser: 1 mg/dL (ref 0.61–1.24)
GFR, Estimated: >60 mL/min (ref >60)
Glucose, Bld: 132 mg/dL — ABNORMAL HIGH (ref 70–99)
Potassium: 4.1 mmol/L (ref 3.5–5.1)
Sodium: 135 mmol/L (ref 135–145)
Total Bilirubin: 0.5 mg/dL (ref 0.3–1.2)
Total Protein: 6.7 g/dL (ref 6.5–8.1)

## 2022-06-22 LAB — ECHOCARDIOGRAM COMPLETE
Area-P 1/2: 2.95 cm2
Height: 74 in
S' Lateral: 4.1 cm
Weight: 5922.44 oz

## 2022-06-22 MED ORDER — PERFLUTREN LIPID MICROSPHERE
1.0000 mL | INTRAVENOUS | Status: AC | PRN
  Administered 2022-06-22: 2 mL via INTRAVENOUS

## 2022-06-22 NOTE — Progress Notes (Signed)
Pt's CPAP is attached to oxygen and he will place it on himself when he is ready.

## 2022-06-22 NOTE — Progress Notes (Signed)
  Progress Note   Patient: Christian [name] EPP:[id number] DOB: [birth year] DOA: 06/14/2022     8 DOS: the patient was seen and examined on 06/22/2022   Brief hospital course: 47 year old M with PMH of morbid obesity, OSA on CPAP and 2 L at night, anxiety and depression, homelessness and allergic rhinitis presenting with productive cough and shortness of breath, and admitted for sepsis due to left lower lobe pneumonia with acute on chronic respiratory failure. Of note, patient has not been able to use his CPAP since he became homeless.     Assessment and Plan: Acute on chronic respiratory failure with hypoxia due to LLL pneumonia At baseline, only uses 2 L with CPAP at night and room air during the day, per pt, currently at 3L CT scan shows evidence of left lower lobe pneumonia Completed Rocephin/azithromycin, completed steroids X 5 days Continue bronchodilators, IS, flutter valve Cont to wean O2 as tolerated to goal of RA if possible  Possible acute on chronic diastolic HF BNP 39, could be falsely low with obesity Echo with EF of 60 to 65%, no regional wall motion abnormality, noted grade 1 diastolic dysfunction.  Unable to detect PA pressure Continue IV lasix, fluid restriction Monitor strict I's and O's, daily weights Daily BMP while on diuretics   Chronic anxiety and depression Stable Continue home Zoloft   OSA on CPAP and 2 L at night Reportedly has not been able to use his CPAP since he became homeless. CPAP at night TOC on board   Hypertension Stable   Homelessness TOC consulted   Morbid obesity Body mass index is 47.27 kg/m.        Subjective:  No new complaints, still with some rhonchi noted.  Still requiring about 3 L of O2  Physical Exam: Vitals:   06/21/22 1239 06/21/22 2029 06/22/22 0701 06/22/22 0833  BP: 123/65 130/70    Pulse: 69 (!) 56    Resp: 17 16    Temp: 97.8 F (36.6 C) 98.1 F (36.7 C)    TempSrc: Oral Oral    SpO2: 97% 95%  93%   Weight:   (!) 167.9 kg   Height:       General: NAD, morbid obesity Cardiovascular: S1, S2 present Respiratory: Some noted rhonchi Abdomen: Soft, nontender, nondistended, bowel sounds present Musculoskeletal: No bilateral pedal edema noted Skin: Normal Psychiatry: Normal mood     Family Communication: None at bedside  Disposition: Status is: Inpatient Remains inpatient appropriate because: Severity of illness  Planned Discharge Destination: Home     Author: Briant Cedar, MD 06/22/2022 5:12 PM  For on call review [email or web address].

## 2022-06-22 NOTE — Progress Notes (Signed)
Occupational Therapy Treatment Patient Details Name: Christian [name] MRN: [id number] DOB: [birth year] Today's Date: 06/22/2022   History of present illness 47 yo male admitted to hospital on 06/14/2022 due to worsening productive cough, SOB and sore throat of the past few days. At presentation to ED pt was febrile, tachycardic with abn labs, and hypoxic requiring supplemental O2. CT revealed LLE infiltrate suggestive of PNA and new dx of sepsis secondary to CAP.  Pt PMH includes but is not limited to: CHF, anxiety, depression, pleural effusion, and OSA. Pt on CPAP however due to recent homelessness pt is unable to utilize.   OT comments  Pt progressing toward established OT goals. Challenging balance, strength, endurance, and functional activity tolerance needed for return to ADL and IADL. Pt performing BUE activity in standing and as well as exercises to optimize strength, body mechanics, and endurance. Pt verbalizing understanding of energy conservation strategies learned today as well as how to pace himself during both exercise and functional activity to introduce good habits and routines into his day as well as prioritize his day to optimize independence.    Recommendations for follow up therapy are one component of a multi-disciplinary discharge planning process, led by the attending physician.  Recommendations may be updated based on patient status, additional functional criteria and insurance authorization.    Assistance Recommended at Discharge PRN  Patient can return home with the following  Assist for transportation   Equipment Recommendations  None recommended by OT    Recommendations for Other Services      Precautions / Restrictions Precautions Precautions: Fall Restrictions Weight Bearing Restrictions: No       Mobility Bed Mobility               General bed mobility comments: recliner on arrival and departure    Transfers Overall transfer level: Needs  assistance Equipment used: Rolling walker (2 wheels) Transfers: Sit to/from Stand Sit to Stand: Supervision           General transfer comment: no cues for hand placement this session.     Balance Overall balance assessment: Needs assistance Sitting-balance support: Feet supported Sitting balance-[name]: Good     Standing balance support: Bilateral upper extremity supported, During functional activity Standing balance-[name]: Fair                             ADL either performed or assessed with clinical judgement   ADL Overall ADL's : Needs assistance/impaired Eating/Feeding: Independent;Sitting Eating/Feeding Details (indicate cue type and reason): on arrival Grooming: Wash/dry hands;Supervision/safety;Standing               Lower Body Dressing: Set up;Sitting/lateral leans;Cueing for safety Lower Body Dressing Details (indicate cue type and reason): to don socks. Reviewed compensatory techniques for figure 4 position. Pt thanking OT for explaining how this facilitates an energy conservation methid Toilet Transfer: Supervision/safety;Ambulation;Rolling walker (2 wheels)           Functional mobility during ADLs: Supervision/safety;Rolling walker (2 wheels)      Extremity/Trunk Assessment Upper Extremity Assessment Upper Extremity Assessment: Overall WFL for tasks assessed (decr endurance with UE tasks)   Lower Extremity Assessment Lower Extremity Assessment: Defer to PT evaluation        Vision       Perception     Praxis      Cognition Arousal/Alertness: Awake/alert Behavior During Therapy: WFL for tasks assessed/performed Overall Cognitive Status: Within Functional Limits  for tasks assessed                                 General Comments: verbose, speech minimially difficult to understand. Slow processing. Possible mild developmental delay(?) Very pleasant.        Exercises Exercises: Other  exercises Other Exercises Other Exercises: Reviewed predominant topics for energy conservation; pt able to recall a few during session Other Exercises: mini squats in front of chair with min cueing for posture and technique. Performing x10 Other Exercises: Performing BUE shoulder flexion in standing due to pt reporting these activities fatigue him. Reviewed activity pacing throughout x10 reps. Other Exercises: Reaching outside BOW in standing to faclilitate reach with weight shift to optimize return to IADLs Other Exercises: Chair push ups with cueing for posture and education regarding body mechanics    Shoulder Instructions       General Comments      Pertinent Vitals/ Pain       Pain Assessment Pain Assessment: Faces Faces Pain Scale: Hurts little more Pain Location: L LE Pain Descriptors / Indicators: Aching, Discomfort, Constant Pain Intervention(s): Limited activity within patient's tolerance, Monitored during session  Home Living                                          Prior Functioning/Environment              Frequency  Min 2X/week        Progress Toward Goals  OT Goals(current goals can now be found in the care plan section)  Progress towards OT goals: Progressing toward goals  Acute Rehab OT Goals OT Goal Formulation: With patient Time For Goal Achievement: 07/01/22 Potential to Achieve Goals: Good ADL Goals Pt Will Transfer to Toilet: Independently;ambulating;regular height toilet Pt Will Perform Toileting - Clothing Manipulation and hygiene: Independently;sit to/from stand;sitting/lateral leans Additional ADL Goal #1: Pt with generalize energy conservation strategies in ADLs and mobility. Additional ADL Goal #2: Pt will complete basic ADLs modified independently.  Plan Discharge plan remains appropriate    Co-evaluation                 AM-PAC OT "6 Clicks" Daily Activity     Outcome Measure   Help from another person  eating meals?: None Help from another person taking care of personal grooming?: A Little Help from another person toileting, which includes using toliet, bedpan, or urinal?: A Little Help from another person bathing (including washing, rinsing, drying)?: A Little Help from another person to put on and taking off regular upper body clothing?: A Little Help from another person to put on and taking off regular lower body clothing?: A Little 6 Click Score: 19    End of Session Equipment Utilized During Treatment: Oxygen;Rolling walker (2 wheels)  OT Visit Diagnosis: Unsteadiness on feet (R26.81)   Activity Tolerance Patient tolerated treatment well   Patient Left in chair;with call bell/phone within reach;with nursing/sitter in room   Nurse Communication Mobility status        Time: 1610-9604 OT Time Calculation (min): 22 min  Charges: OT General Charges $OT Visit: 1 Visit OT Treatments $Self Care/Home Management : 8-22 mins  Myrla Halsted, OTD, OTR/L [facility] Acute Rehabilitation Office: [phone]   Myrla Halsted 06/22/2022, 5:42 PM

## 2022-06-22 NOTE — Progress Notes (Signed)
  Echocardiogram 2D Echocardiogram has been performed.  Janalyn Harder 06/22/2022, 2:44 PM

## 2022-06-22 NOTE — Progress Notes (Signed)
Mobility Specialist - Progress Note   06/22/22 1402  Mobility  Activity Ambulated with assistance in hallway  Level of Assistance Standby assist, set-up cues, supervision of patient - no hands on  Assistive Device Front wheel walker  Distance Ambulated (ft) 350 ft  Activity Response Tolerated well  Mobility Referral Yes  $Mobility charge 1 Mobility   Pt received in chair and agreed to mobility, had no issues throughout session.   Nurse told this tech to put his O2 to 2L, pt saturations were at 94% throughout session.  Returned to chair with all needs met.  Marilynne Halsted Mobility Specialist

## 2022-06-23 DIAGNOSIS — J189 Pneumonia, unspecified organism: Secondary | ICD-10-CM | POA: Diagnosis not present

## 2022-06-23 LAB — BASIC METABOLIC PANEL
Anion gap: 10 (ref 5–15)
BUN: 26 mg/dL — ABNORMAL HIGH (ref 6–20)
CO2: 27 mmol/L (ref 22–32)
Calcium: 8.3 mg/dL — ABNORMAL LOW (ref 8.9–10.3)
Chloride: 97 mmol/L — ABNORMAL LOW (ref 98–111)
Creatinine, Ser: 0.86 mg/dL (ref 0.61–1.24)
GFR, Estimated: >60 mL/min (ref >60)
Glucose, Bld: 95 mg/dL (ref 70–99)
Potassium: 3.1 mmol/L — ABNORMAL LOW (ref 3.5–5.1)
Sodium: 134 mmol/L — ABNORMAL LOW (ref 135–145)

## 2022-06-23 LAB — CBC WITH DIFFERENTIAL/PLATELET
Abs Immature Granulocytes: 0.27 10*3/uL — ABNORMAL HIGH (ref 0.00–0.07)
Basophils Absolute: 0.1 10*3/uL (ref 0.0–0.1)
Basophils Relative: 1 %
Eosinophils Absolute: 0.2 10*3/uL (ref 0.0–0.5)
Eosinophils Relative: 1 %
HCT: 39.2 % (ref 39.0–52.0)
Hemoglobin: 12.7 g/dL — ABNORMAL LOW (ref 13.0–17.0)
Immature Granulocytes: 2 %
Lymphocytes Relative: 29 %
Lymphs Abs: 3.6 10*3/uL (ref 0.7–4.0)
MCH: 27.1 pg (ref 26.0–34.0)
MCHC: 32.4 g/dL (ref 30.0–36.0)
MCV: 83.6 fL (ref 80.0–100.0)
Monocytes Absolute: 1.4 10*3/uL — ABNORMAL HIGH (ref 0.1–1.0)
Monocytes Relative: 11 %
Neutro Abs: 7.1 10*3/uL (ref 1.7–7.7)
Neutrophils Relative %: 56 %
Platelets: 337 10*3/uL (ref 150–400)
RBC: 4.69 MIL/uL (ref 4.22–5.81)
RDW: 14.1 % (ref 11.5–15.5)
WBC: 12.6 10*3/uL — ABNORMAL HIGH (ref 4.0–10.5)
nRBC: 0 % (ref 0.0–0.2)

## 2022-06-23 LAB — MAGNESIUM: Magnesium: 2.4 mg/dL (ref 1.7–2.4)

## 2022-06-23 MED ORDER — POTASSIUM CHLORIDE CRYS ER 20 MEQ PO TBCR
40.0000 meq | EXTENDED_RELEASE_TABLET | Freq: Two times a day (BID) | ORAL | Status: AC
  Administered 2022-06-23 – 2022-06-24 (×4): 40 meq via ORAL
  Filled 2022-06-23 (×5): qty 2

## 2022-06-23 MED ORDER — POTASSIUM CHLORIDE CRYS ER 20 MEQ PO TBCR
40.0000 meq | EXTENDED_RELEASE_TABLET | Freq: Once | ORAL | Status: DC
  Administered 2022-06-23: 40 meq via ORAL

## 2022-06-23 MED ORDER — FUROSEMIDE 10 MG/ML IJ SOLN
40.0000 mg | Freq: Every day | INTRAMUSCULAR | Status: DC
  Administered 2022-06-23: 40 mg via INTRAVENOUS
  Filled 2022-06-23: qty 4

## 2022-06-23 NOTE — Progress Notes (Addendum)
  Progress Note   Patient: Christian [name] YNW:[id number] DOB: 31-Dec-1975 DOA: 06/14/2022     9 DOS: the patient was seen and examined on 06/23/2022   Brief hospital course: 47 year old M with PMH of morbid obesity, OSA on CPAP and 2 L at night, anxiety and depression, homelessness and allergic rhinitis presenting with productive cough and shortness of breath, and admitted for sepsis due to left lower lobe pneumonia with acute on chronic respiratory failure. Of note, patient has not been able to use his CPAP since he became homeless.     Assessment and Plan: Acute on chronic respiratory failure with hypoxia due to LLL pneumonia At baseline, only uses 2 L with CPAP at night and room air during the day, per pt, currently weaned to 2L CT scan shows evidence of left lower lobe pneumonia Completed Rocephin/azithromycin, completed steroids X 5 days Continue bronchodilators, IS, flutter valve Cont to wean O2 as tolerated to goal of RA if possible  Possible acute on chronic diastolic HF BNP 39, could be falsely low with obesity Echo with EF of 60 to 65%, no regional wall motion abnormality, noted grade 1 diastolic dysfunction.  Unable to detect PA pressure Continue IV lasix, fluid restriction Monitor strict I's and O's, daily weights Daily BMP while on diuretics  Hypokalemia Replace as needed   Chronic anxiety and depression Stable Continue home Zoloft   OSA on CPAP and 2 L at night Reportedly has not been able to use his CPAP since he became homeless. CPAP at night TOC on board   Hypertension Stable   Homelessness TOC consulted   Morbid obesity Body mass index is 47.27 kg/m.        Subjective:  Reports overall improvement, was able to ambulate to ED and requiring only 2 L of O2 saturating in the high 90s.  Denies any chest pain.  Diuresing well.    Physical Exam: Vitals:   06/22/22 2100 06/23/22 0349 06/23/22 0906 06/23/22 1403  BP:  122/72  127/80  Pulse:  (!) 56   69  Resp:  18  16  Temp:  (!) 97.4 F (36.3 C)  97.7 F (36.5 C)  TempSrc:  Oral  Oral  SpO2: 98% 96% 97% 95%  Weight:      Height:       General: NAD, morbid obesity Cardiovascular: S1, S2 present Respiratory: Some noted rhonchi Abdomen: Soft, nontender, nondistended, bowel sounds present Musculoskeletal: Trace bilateral pedal edema noted Skin: Normal Psychiatry: Normal mood     Family Communication: None at bedside  Disposition: Status is: Inpatient Remains inpatient appropriate because: Severity of illness  Planned Discharge Destination: Home, noted homeless status     Author: Briant Cedar, MD 06/23/2022 4:36 PM  For on call review [email or web address].

## 2022-06-23 NOTE — Progress Notes (Signed)
Physical Therapy Treatment Patient Details Name: Christian [name] MRN: [id number] DOB: 1976-01-23 Today's Date: 06/23/2022   History of Present Illness 47 yo male admitted to hospital on 06/14/2022 due to worsening productive cough, SOB and sore throat of the past few days. At presentation to ED pt was febrile, tachycardic with abn labs, and hypoxic requiring supplemental O2. CT revealed LLE infiltrate suggestive of PNA and new dx of sepsis secondary to CAP.  Pt PMH includes but is not limited to: CHF, anxiety, depression, pleural effusion, and OSA. Pt on CPAP however due to recent homelessness pt is unable to utilize.    PT Comments    Pt continues to progress toward acute PT goals this session with progression of ambulation distance. Pt ambulated ~257ft with supervision and x3 standing rest breaks between. Trialed ambulation without use of RW and pt with reported feeling of instability and increased pressure in L knee and observed decreased gait speed and stride length. Pt's O2 sats maintained 94-99% throughout session with 3L and when weaned to 2L [place]. Pt will benefit from continued skilled PT to increase their independence and maximize safety with mobility.     Recommendations for follow up therapy are one component of a multi-disciplinary discharge planning process, led by the attending physician.  Recommendations may be updated based on patient status, additional functional criteria and insurance authorization.  Follow Up Recommendations       Assistance Recommended at Discharge Intermittent Supervision/Assistance  Patient can return home with the following A little help with walking and/or transfers;A little help with bathing/dressing/bathroom;Assistance with cooking/housework;Assist for transportation;Help with stairs or ramp for entrance   Equipment Recommendations  Rolling walker (2 wheels) (may beneift from bariatric)    Recommendations for Other Services       Precautions /  Restrictions Precautions Precautions: Fall Precaution Comments: monitor O2 Restrictions Weight Bearing Restrictions: No     Mobility  Bed Mobility               General bed mobility comments: in recliner pre/post session    Transfers Overall transfer level: Modified independent Equipment used: Rolling walker (2 wheels) Transfers: Sit to/from Stand Sit to Stand: Modified independent (Device/Increase time)           General transfer comment: pt demonstrating proper hand placement without cues    Ambulation/Gait Ambulation/Gait assistance: Supervision Gait Distance (Feet): 220 Feet Assistive device: Rolling walker (2 wheels), None Gait Pattern/deviations: Step-to pattern, Decreased stride length Gait velocity: decreased     General Gait Details: multiple standing rest breaks due to SOB/fatigue. O2 3 L/min and pt maintained 96-99%. Weaned to 2L and pt with O2 sats >94%, left on 2L at end of session, RN notified.    Trialed ~32ft withotu use of RW. Pt with step to pattern and decreased heel strike, unsteady and decreased gait speed compared to with use of RW. Pt reports feeling off balance since getting use to ambulating with RW and reports increased pressure L knee with more weightbearing through LEs.   Stairs             Wheelchair Mobility    Modified Rankin (Stroke Patients Only)       Balance Overall balance assessment: Needs assistance Sitting-balance support: Feet supported Sitting balance-[name]: Good     Standing balance support: Bilateral upper extremity supported, No upper extremity supported, During functional activity Standing balance-[name]: Fair  Cognition Arousal/Alertness: Awake/alert Behavior During Therapy: WFL for tasks assessed/performed Overall Cognitive Status: Within Functional Limits for tasks assessed                                 General Comments: Slow  processing. Possible mild developmental delay(?) Very pleasant.        Exercises      General Comments        Pertinent Vitals/Pain Pain Assessment Pain Assessment: No/denies pain    Home Living                          Prior Function            PT Goals (current goals can now be found in the care plan section) Acute Rehab PT Goals Patient Stated Goal: to find a place to stay I can use the CPAP PT Goal Formulation: With patient Time For Goal Achievement: 07/01/22 Potential to Achieve Goals: Good Progress towards PT goals: Progressing toward goals    Frequency    Min 1X/week      PT Plan Current plan remains appropriate    Co-evaluation              AM-PAC PT "6 Clicks" Mobility   Outcome Measure  Help needed turning from your back to your side while in a flat bed without using bedrails?: None Help needed moving from lying on your back to sitting on the side of a flat bed without using bedrails?: None Help needed moving to and from a bed to a chair (including a wheelchair)?: A Little Help needed standing up from a chair using your arms (e.g., wheelchair or bedside chair)?: A Little Help needed to walk in hospital room?: A Little Help needed climbing 3-5 steps with a railing? : A Lot 6 Click Score: 19    End of Session Equipment Utilized During Treatment: Gait belt;Oxygen Activity Tolerance: Patient tolerated treatment well Patient left: in chair;with call bell/phone within reach Nurse Communication: Mobility status;Other (comment) (O2 sats and that pt left on 2L) PT Visit Diagnosis: Unsteadiness on feet (R26.81);Other abnormalities of gait and mobility (R26.89);Muscle weakness (generalized) (M62.81);Difficulty in walking, not elsewhere classified (R26.2)     Time: 1191-4782 PT Time Calculation (min) (ACUTE ONLY): 23 min  Charges:  $Therapeutic Activity: 23-37 mins                    Lyman Speller PT, DPT  Acute Rehabilitation Services   Office [phone]  06/23/2022, 2:45 PM

## 2022-06-24 DIAGNOSIS — R2689 Other abnormalities of gait and mobility: Secondary | ICD-10-CM | POA: Diagnosis not present

## 2022-06-24 DIAGNOSIS — M6281 Muscle weakness (generalized): Secondary | ICD-10-CM | POA: Diagnosis not present

## 2022-06-24 DIAGNOSIS — R2681 Unsteadiness on feet: Secondary | ICD-10-CM | POA: Diagnosis not present

## 2022-06-24 DIAGNOSIS — J189 Pneumonia, unspecified organism: Secondary | ICD-10-CM | POA: Diagnosis not present

## 2022-06-24 DIAGNOSIS — R262 Difficulty in walking, not elsewhere classified: Secondary | ICD-10-CM | POA: Diagnosis not present

## 2022-06-24 LAB — CBC WITH DIFFERENTIAL/PLATELET
Abs Immature Granulocytes: 0.27 10*3/uL — ABNORMAL HIGH (ref 0.00–0.07)
Basophils Absolute: 0.1 10*3/uL (ref 0.0–0.1)
Basophils Relative: 1 %
Eosinophils Absolute: 0.3 10*3/uL (ref 0.0–0.5)
Eosinophils Relative: 3 %
HCT: 40.1 % (ref 39.0–52.0)
Hemoglobin: 12.7 g/dL — ABNORMAL LOW (ref 13.0–17.0)
Immature Granulocytes: 3 %
Lymphocytes Relative: 28 %
Lymphs Abs: 2.9 10*3/uL (ref 0.7–4.0)
MCH: 26.8 pg (ref 26.0–34.0)
MCHC: 31.7 g/dL (ref 30.0–36.0)
MCV: 84.6 fL (ref 80.0–100.0)
Monocytes Absolute: 1 10*3/uL (ref 0.1–1.0)
Monocytes Relative: 10 %
Neutro Abs: 6 10*3/uL (ref 1.7–7.7)
Neutrophils Relative %: 55 %
Platelets: 324 10*3/uL (ref 150–400)
RBC: 4.74 MIL/uL (ref 4.22–5.81)
RDW: 14.1 % (ref 11.5–15.5)
WBC: 10.5 10*3/uL (ref 4.0–10.5)
nRBC: 0 % (ref 0.0–0.2)

## 2022-06-24 LAB — BASIC METABOLIC PANEL
Anion gap: 11 (ref 5–15)
BUN: 23 mg/dL — ABNORMAL HIGH (ref 6–20)
CO2: 27 mmol/L (ref 22–32)
Calcium: 8.5 mg/dL — ABNORMAL LOW (ref 8.9–10.3)
Chloride: 102 mmol/L (ref 98–111)
Creatinine, Ser: 1.05 mg/dL (ref 0.61–1.24)
GFR, Estimated: >60 mL/min (ref >60)
Glucose, Bld: 96 mg/dL (ref 70–99)
Potassium: 4.1 mmol/L (ref 3.5–5.1)
Sodium: 140 mmol/L (ref 135–145)

## 2022-06-24 MED ORDER — TORSEMIDE 20 MG PO TABS
20.0000 mg | ORAL_TABLET | Freq: Two times a day (BID) | ORAL | Status: DC
  Administered 2022-06-24 – 2022-06-25 (×3): 20 mg via ORAL
  Filled 2022-06-24 (×4): qty 1

## 2022-06-24 NOTE — Progress Notes (Signed)
Physical Therapy Treatment Patient Details Name: Christian [name] MRN: [id number] DOB: 08-Jul-1975 Today's Date: 06/24/2022   History of Present Illness 47 yo male admitted to hospital on 06/14/2022 due to worsening productive cough, SOB and sore throat of the past few days. At presentation to ED pt was febrile, tachycardic with abn labs, and hypoxic requiring supplemental O2. CT revealed LLE infiltrate suggestive of PNA and new dx of sepsis secondary to CAP.  Pt PMH includes but is not limited to: CHF, anxiety, depression, pleural effusion, and OSA. Pt on CPAP however due to recent homelessness pt is unable to utilize.    PT Comments    Pt was sitting in the recliner at the start of the session. Prior to session, 97% O2 on 2L at rest, weaned to room air. Assisted pt to ambulate down hallway with RW for 148ft. No LOB, increased speed. Pt stated there was no pain. Average O2 during activity on room air: 92%. Educated and instructed pt on pursed lip breathing. Returned to room and assisted pt to transfer to recliner to take a sitting rest break. Assisted pt to ambulate 120 ft down hallways again without assistive device, decreased speed and c/o 3/10 pain in B knees, but no LOB. Assisted pt to transfer to recliner and discussed possibility about cane usage. 2 minute recovery O2 monitored, average O2: 94%. Pt left on room air.  O2 on 2L at rest: 97% O2 on room air with activity: 92% O2 on room air at 2 minutes recovery: 94%   Recommendations for follow up therapy are one component of a multi-disciplinary discharge planning process, led by the attending physician.  Recommendations may be updated based on patient status, additional functional criteria and insurance authorization.  Follow Up Recommendations       Assistance Recommended at Discharge Intermittent Supervision/Assistance  Patient can return home with the following A little help with walking and/or transfers;A little help with  bathing/dressing/bathroom;Assistance with cooking/housework;Assist for transportation;Help with stairs or ramp for entrance   Equipment Recommendations  Cane    Recommendations for Other Services       Precautions / Restrictions Precautions Precautions: Fall Precaution Comments: monitor O2 Restrictions Weight Bearing Restrictions: No     Mobility  Bed Mobility               General bed mobility comments: in recliner pre/post session    Transfers Overall transfer level: Modified independent Equipment used: Rolling walker (2 wheels), None Transfers: Sit to/from Stand Sit to Stand: Modified independent (Device/Increase time)           General transfer comment: pt demonstrating proper hand placement without cues    Ambulation/Gait Ambulation/Gait assistance: Supervision Gait Distance (Feet): 220 Feet Assistive device: Rolling walker (2 wheels), None Gait Pattern/deviations: Step-to pattern, Decreased stride length Gait velocity: decreased     General Gait Details: Pt reports feeling off balance since getting use to ambulating with RW and reports increased pressure L knee with more weightbearing through LEs.   Stairs             Wheelchair Mobility    Modified Rankin (Stroke Patients Only)       Balance                                            Cognition Arousal/Alertness: Awake/alert Behavior During Therapy: WFL for tasks assessed/performed Overall Cognitive  Status: Within Functional Limits for tasks assessed                                 General Comments: Slow processing. Possible mild developmental delay(?) Very pleasant.        Exercises      General Comments        Pertinent Vitals/Pain Pain Assessment Pain Score: 3  Pain Location: B Knees Pain Descriptors / Indicators: Discomfort Pain Intervention(s): Monitored during session, Repositioned    Home Living                           Prior Function            PT Goals (current goals can now be found in the care plan section) Acute Rehab PT Goals Patient Stated Goal: to find a place to stay I can use the CPAP PT Goal Formulation: With patient Time For Goal Achievement: 07/01/22 Potential to Achieve Goals: Good Progress towards PT goals: Progressing toward goals    Frequency    Min 1X/week      PT Plan Current plan remains appropriate    Co-evaluation              AM-PAC PT "6 Clicks" Mobility   Outcome Measure  Help needed turning from your back to your side while in a flat bed without using bedrails?: None Help needed moving from lying on your back to sitting on the side of a flat bed without using bedrails?: None Help needed moving to and from a bed to a chair (including a wheelchair)?: A Little Help needed standing up from a chair using your arms (e.g., wheelchair or bedside chair)?: A Little Help needed to walk in hospital room?: A Little Help needed climbing 3-5 steps with a railing? : A Lot 6 Click Score: 19    End of Session Equipment Utilized During Treatment: Gait belt Activity Tolerance: Patient tolerated treatment well Patient left: in chair;with call bell/phone within reach Nurse Communication: Mobility status PT Visit Diagnosis: Unsteadiness on feet (R26.81);Other abnormalities of gait and mobility (R26.89);Muscle weakness (generalized) (M62.81);Difficulty in walking, not elsewhere classified (R26.2)     Time: 0981-1914 PT Time Calculation (min) (ACUTE ONLY): 26 min  Charges:  $Gait Training: 8-22 mins $Therapeutic Activity: 8-22 mins                     Sharlene Motts, SPTA

## 2022-06-24 NOTE — Progress Notes (Signed)
  Progress Note   Patient: Christian [name] XBJ:[id number] DOB: 1975-11-16 DOA: 06/14/2022     10 DOS: the patient was seen and examined on 06/24/2022   Brief hospital course: 47 year old M with PMH of morbid obesity, OSA on CPAP and 2 L at night, anxiety and depression, homelessness and allergic rhinitis presenting with productive cough and shortness of breath, and admitted for sepsis due to left lower lobe pneumonia with acute on chronic respiratory failure. Of note, patient has not been able to use his CPAP since he became homeless.     Assessment and Plan: Acute on chronic respiratory failure with hypoxia due to LLL pneumonia Improving, currently weaned to RA At baseline, only uses 2 L with CPAP at night and room air during the day CT scan shows evidence of left lower lobe pneumonia Completed Rocephin/azithromycin, completed steroids X 5 days Continue bronchodilators, IS, flutter valve  Possible acute on chronic diastolic HF BNP 39, could be falsely low with obesity Echo with EF of 60 to 65%, no regional wall motion abnormality, noted grade 1 diastolic dysfunction.  Unable to detect PA pressure S/p IV lasix, fluid restriction---> switch to PTA PO torsemide 20 mg BID  Monitor strict I's and O's, daily weights Daily BMP while on diuretics  Hypokalemia Replace as needed   Chronic anxiety and depression Stable Continue home Zoloft   OSA on CPAP and 2 L at night Reportedly has not been able to use his CPAP since he became homeless. CPAP at night TOC on board   Hypertension Stable   Homelessness TOC consulted   Morbid obesity Body mass index is 47.27 kg/m.        Subjective:  Reports overall improvement.  Currently saturating well on room air.     Physical Exam: Vitals:   06/23/22 2111 06/24/22 0620 06/24/22 0904 06/24/22 1421  BP: (!) 112/48 120/64  125/76  Pulse: 64 (!) 59  78  Resp: 20 18  20   Temp: 97.7 F (36.5 C) 97.8 F (36.6 C)  97.9 F (36.6 C)   TempSrc: Oral Oral  Oral  SpO2: 94% 96% 96% 92%  Weight:      Height:       General: NAD, morbid obesity Cardiovascular: S1, S2 present Respiratory: CTAB Abdomen: Soft, nontender, nondistended, bowel sounds present Musculoskeletal: Trace bilateral pedal edema noted Skin: Normal Psychiatry: Normal mood     Family Communication: None at bedside  Disposition: Status is: Inpatient Remains inpatient appropriate because: Severity of illness  Planned Discharge Destination: Home, noted homeless status, plan to d/c to [facility]     Author: Briant Cedar, MD 06/24/2022 3:58 PM  For on call review [email or web address].

## 2022-06-24 NOTE — Progress Notes (Signed)
RT provided water for PT's CPAP. CPAP is plugged into red out and electrical cord has no frays. PT want to self manage CPAP equipment.

## 2022-06-24 NOTE — TOC Progression Note (Addendum)
Transition of Care [facility]) - Progression Note    Patient Details  Name: Christian [name] MRN: [id number] Date of Birth: 03/31/1975  Transition of Care [facility]) CM/SW Contact  Otelia Santee, LCSW Phone Number: 06/24/2022, 12:12 PM  Clinical Narrative:    Met with pt to discuss discharge plan. Pt shares he is on the waitlist for [facility] and [facility].  He called [facility] today who share that they do not have bed availability at this time. He reports having a missed call from [facility] and plans to return call to see if bed is available.  CSW discussed alternative option if no bed at [facility] is to continue to stay at [facility] until placement can be found. Pt shares the [facility] closes at 3pm until 6:30pm and does not like the fact he has to leave during this time period. He also shares that their night shelter is more of a "place to be" and shares they do not have beds. CSW encouraged pt to follow up with [facility] shelter to determine if shelter bed available. Pt aware of no bed available plan will be for DC to [facility]   Update 1:50pm: Pt recommended for single point cane. Have contacted [employer] for order. Gilmer Mor will be delivered once DME order has been placed.   Expected Discharge Plan: Homeless Shelter Barriers to Discharge: Continued Medical Work up  Expected Discharge Plan and Services In-house Referral: NA Discharge Planning Services: NA Post Acute Care Choice: NA Living arrangements for the past 2 months: Homeless Shelter, Hotel/Motel                 DME Arranged: N/A DME Agency: NA                   Social Determinants of Health (SDOH) Interventions SDOH Screenings   Food Insecurity: No Food Insecurity (06/19/2022)  Housing: Low Risk  (06/19/2022)  Recent Concern: Housing - Medium Risk (05/11/2022)  Transportation Needs: No Transportation Needs (06/19/2022)  Utilities: Not At Risk (06/19/2022)  Alcohol Screen: Low Risk  (12/24/2020)  Depression  (PHQ2-9): Medium Risk (05/11/2022)  Financial Resource Strain: High Risk (05/11/2022)  Physical Activity: Insufficiently Active (05/11/2022)  Social Connections: Moderately Isolated (05/11/2022)  Stress: Stress Concern Present (05/11/2022)  Tobacco Use: Low Risk  (06/19/2022)    Readmission Risk Interventions    06/16/2022    1:32 PM 10/11/2020   12:09 PM  Readmission Risk Prevention Plan  Post Dischage Appt  Complete  Medication Screening  Complete  Transportation Screening Complete Complete  PCP or Specialist Appt within 5-7 Days Complete   Home Care Screening Complete   Medication Review (RN CM) Complete

## 2022-06-24 NOTE — Progress Notes (Signed)
Mobility Specialist - Progress Note   06/24/22 0830  Mobility  Activity Ambulated with assistance to bathroom  Level of Assistance Modified independent, requires aide device or extra time  Assistive Device Front wheel walker  Distance Ambulated (ft) 25 ft  Activity Response Tolerated well  Mobility Referral Yes  $Mobility charge 1 Mobility   Pt received in bed and requested assistance to restroom. Mod I for bed mobility and STS. Pt returned to recliner with all needs met.  Marilynne Halsted Mobility Specialist

## 2022-06-25 ENCOUNTER — Other Ambulatory Visit (HOSPITAL_COMMUNITY): Payer: Self-pay

## 2022-06-25 DIAGNOSIS — J189 Pneumonia, unspecified organism: Secondary | ICD-10-CM | POA: Diagnosis not present

## 2022-06-25 LAB — CBC WITH DIFFERENTIAL/PLATELET
Abs Immature Granulocytes: 0.16 10*3/uL — ABNORMAL HIGH (ref 0.00–0.07)
Basophils Absolute: 0.1 10*3/uL (ref 0.0–0.1)
Basophils Relative: 1 %
Eosinophils Absolute: 0.3 10*3/uL (ref 0.0–0.5)
Eosinophils Relative: 3 %
HCT: 40.8 % (ref 39.0–52.0)
Hemoglobin: 12.8 g/dL — ABNORMAL LOW (ref 13.0–17.0)
Immature Granulocytes: 2 %
Lymphocytes Relative: 24 %
Lymphs Abs: 2.2 10*3/uL (ref 0.7–4.0)
MCH: 26.7 pg (ref 26.0–34.0)
MCHC: 31.4 g/dL (ref 30.0–36.0)
MCV: 85 fL (ref 80.0–100.0)
Monocytes Absolute: 0.8 10*3/uL (ref 0.1–1.0)
Monocytes Relative: 9 %
Neutro Abs: 5.7 10*3/uL (ref 1.7–7.7)
Neutrophils Relative %: 61 %
Platelets: 325 10*3/uL (ref 150–400)
RBC: 4.8 MIL/uL (ref 4.22–5.81)
RDW: 14.2 % (ref 11.5–15.5)
WBC: 9.2 10*3/uL (ref 4.0–10.5)
nRBC: 0 % (ref 0.0–0.2)

## 2022-06-25 LAB — BASIC METABOLIC PANEL
Anion gap: 10 (ref 5–15)
BUN: 24 mg/dL — ABNORMAL HIGH (ref 6–20)
CO2: 28 mmol/L (ref 22–32)
Calcium: 8.5 mg/dL — ABNORMAL LOW (ref 8.9–10.3)
Chloride: 100 mmol/L (ref 98–111)
Creatinine, Ser: 1.12 mg/dL (ref 0.61–1.24)
GFR, Estimated: >60 mL/min (ref >60)
Glucose, Bld: 95 mg/dL (ref 70–99)
Potassium: 3.6 mmol/L (ref 3.5–5.1)
Sodium: 138 mmol/L (ref 135–145)

## 2022-06-25 MED ORDER — POTASSIUM CHLORIDE CRYS ER 20 MEQ PO TBCR
40.0000 meq | EXTENDED_RELEASE_TABLET | Freq: Once | ORAL | Status: AC
  Administered 2022-06-25: 40 meq via ORAL
  Filled 2022-06-25: qty 2

## 2022-06-25 MED ORDER — DM-GUAIFENESIN ER 30-600 MG PO TB12
1.0000 | ORAL_TABLET | Freq: Two times a day (BID) | ORAL | 0 refills | Status: AC | PRN
  Filled 2022-06-25: qty 10, 5d supply, fill #0

## 2022-06-25 NOTE — Progress Notes (Signed)
OT Cancellation Note  Patient Details Name: Christian [name] MRN: [id number] DOB: 05-May-1975   Cancelled Treatment:    Reason Eval/Treat Not Completed: Other (comment) Patient reported plan was to d/c today with patient wanting to save energy for leaving hospital. OT to continue to follow.  Rosalio Loud, MS Acute Rehabilitation Department Office# [phone]  06/25/2022, 1:10 PM

## 2022-06-25 NOTE — Plan of Care (Signed)

## 2022-06-25 NOTE — Discharge Summary (Signed)
Physician Discharge Summary   Patient: Christian [name] MRN: [id number] DOB: [birth year]  Admit date:     06/14/2022  Discharge date: 06/25/22  Discharge Physician: Briant Cedar   PCP: [name], MD   Recommendations at discharge:   Follow-up with PCP in 1 week    Hospital Course: 47 year old M with PMH of morbid obesity, OSA on CPAP and 2 L at night, anxiety and depression, homelessness and allergic rhinitis presenting with productive cough and shortness of breath, and admitted for sepsis due to left lower lobe pneumonia with acute on chronic respiratory failure.   Today, had an extensive conversation with patient, about the need to intensify his search for group home as well as possibly getting a job.  Waiting back to hear from Social Security about disability.  Currently denies any new complaints, ambulating on room air, denies any chest pain, shortness of breath, abdominal pain, nausea/vomiting, fever/chills.  Patient was provided a taxi voucher for transportation   Assessment and Plan:  Acute on chronic respiratory failure with hypoxia due to LLL pneumonia Improved, currently weaned to RA At baseline, only uses 2 L with CPAP at night and room air during the day CT scan shows evidence of left lower lobe pneumonia Completed Rocephin/azithromycin, completed steroids X 5 days Continue inhalers prn   Possible acute on chronic diastolic HF BNP 39, could be falsely low with obesity Echo with EF of 60 to 65%, no regional wall motion abnormality, noted grade 1 diastolic dysfunction.  Unable to detect PA pressure S/p IV lasix, fluid restriction---> switched to PTA PO torsemide 20 mg BID, continue    Hypokalemia Replaced as needed   Chronic anxiety and depression Stable Continue home Zoloft   OSA on CPAP CPAP at night   Hypertension Stable   Homelessness TOC consulted   Morbid obesity Body mass index is 47.27 kg/m.         Consultants:  None Procedures performed: None Disposition: IRC Diet recommendation:  Cardiac diet   DISCHARGE MEDICATION: Allergies as of 06/25/2022       Reactions   Shellfish Allergy Nausea Only        Medication List     TAKE these medications    albuterol 108 (90 Base) MCG/ACT inhaler Commonly known as: VENTOLIN HFA Inhale 2 puffs into the lungs every 6 (six) hours as needed for wheezing or shortness of breath.   dextromethorphan-guaiFENesin 30-600 MG 12hr tablet Commonly known as: MUCINEX DM Take 1 tablet by mouth 2 (two) times daily as needed for up to 5 days for cough.   fluticasone 50 MCG/ACT nasal spray Commonly known as: FLONASE Spray 2 sprays into both nostrils once daily.   meloxicam 15 MG tablet Commonly known as: MOBIC Take 1 tablet (15 mg total) by mouth daily.   OXYGEN Inhale 2 L/min into the lungs at bedtime.   sertraline 100 MG tablet Commonly known as: ZOLOFT Take 150 mg by mouth daily. What changed: Another medication with the same name was removed. Continue taking this medication, and follow the directions you see here.   torsemide 20 MG tablet Commonly known as: DEMADEX Take 1 tablet (20 mg total) by mouth 2 (two) times daily.   vitamin B-12 100 MCG tablet Commonly known as: CYANOCOBALAMIN Take 1 tablet (100 mcg total) by mouth once daily.               Durable Medical Equipment  (From admission, onward)  Start     Ordered   06/24/22 1406  For home use only DME Cane  Once       Comments: For mobility   06/24/22 1405            Follow-up Information     [name], MD. Schedule an appointment as soon as possible for a visit in 1 week(s).   Specialty: Family Medicine Contact information: [address] [place] [address] [phone]                Discharge Exam: Ceasar Mons Weights   06/20/22 0259 06/22/22 0701 06/25/22 0500  Weight: (!) 175 kg (!) 167.9 kg (!) 162.6 kg   General: NAD   Cardiovascular: S1, S2 present Respiratory: CTAB Abdomen: Soft, nontender, nondistended, bowel sounds present Musculoskeletal: No bilateral pedal edema noted Skin: Normal Psychiatry: Normal mood   Condition at discharge: stable  The results of significant diagnostics from this hospitalization (including imaging, microbiology, ancillary and laboratory) are listed below for reference.   Imaging Studies: ECHOCARDIOGRAM COMPLETE  Result Date: 06/22/2022    ECHOCARDIOGRAM REPORT   Patient Name:   Christian [name] Date of Exam: 06/22/2022 Medical Rec #:  [id number]         Height:       74.0 in Accession #:    [id number]        Weight:       370.1 lb Date of Birth:  [birth year]         BSA:          2.823 m Patient Age:    47 years          BP:           130/70 mmHg Patient Gender: M                 HR:           61 bpm. Exam Location:  Inpatient Procedure: 2D Echo, 3D Echo, Cardiac Doppler, Color Doppler and Intracardiac            Opacification Agent Indications:    R06.02 SOB  History:        Patient has prior history of Echocardiogram examinations, most                 recent 11/18/2020. CHF, Signs/Symptoms:Shortness of Breath,                 Dyspnea and Chest Pain; Risk Factors:Hypertension and Sleep                 Apnea.  Sonographer:    Sheralyn Boatman RDCS Referring Phys: [id number] Monica Martinez [facility]  Sonographer Comments: Technically difficult study due to poor echo windows, suboptimal parasternal window, suboptimal subcostal window and patient is obese. Image acquisition challenging due to patient body habitus. IMPRESSIONS  1. Left ventricular ejection fraction, by estimation, is 60 to 65%. Left ventricular ejection fraction by 3D volume is 61 %. The left ventricle has normal function. The left ventricle has no regional wall motion abnormalities. Left ventricular diastolic  parameters are consistent with Grade I diastolic dysfunction (impaired relaxation).  2. Right ventricular systolic function is  normal. The right ventricular size is mildly enlarged. Tricuspid regurgitation signal is inadequate for assessing PA pressure.  3. The mitral valve is normal in structure. No evidence of mitral valve regurgitation. No evidence of mitral stenosis.  4. The aortic valve is normal in structure. Aortic valve regurgitation is  not visualized. No aortic stenosis is present. FINDINGS  Left Ventricle: Left ventricular ejection fraction, by estimation, is 60 to 65%. Left ventricular ejection fraction by 3D volume is 61 %. The left ventricle has normal function. The left ventricle has no regional wall motion abnormalities. Definity contrast agent was given IV to delineate the left ventricular endocardial borders. The left ventricular internal cavity size was normal in size. There is no left ventricular hypertrophy. Left ventricular diastolic parameters are consistent with Grade I diastolic dysfunction (impaired relaxation). Normal left ventricular filling pressure. Right Ventricle: The right ventricular size is mildly enlarged. No increase in right ventricular wall thickness. Right ventricular systolic function is normal. Tricuspid regurgitation signal is inadequate for assessing PA pressure. Left Atrium: Left atrial size was normal in size. Right Atrium: Right atrial size was normal in size. Pericardium: There is no evidence of pericardial effusion. Mitral Valve: The mitral valve is normal in structure. No evidence of mitral valve regurgitation. No evidence of mitral valve stenosis. Tricuspid Valve: The tricuspid valve is normal in structure. Tricuspid valve regurgitation is not demonstrated. No evidence of tricuspid stenosis. Aortic Valve: The aortic valve is normal in structure. Aortic valve regurgitation is not visualized. No aortic stenosis is present. Pulmonic Valve: The pulmonic valve was normal in structure. Pulmonic valve regurgitation is not visualized. No evidence of pulmonic stenosis. Aorta: The aortic root is  normal in size and structure. Venous: The inferior vena cava was not well visualized. IAS/Shunts: No atrial level shunt detected by color flow Doppler.  LEFT VENTRICLE PLAX 2D LVIDd:         5.70 cm         Diastology LVIDs:         4.10 cm         LV e' medial:    7.62 cm/s LV PW:         1.20 cm         LV E/e' medial:  10.6 LV IVS:        1.10 cm         LV e' lateral:   8.22 cm/s LVOT diam:     2.50 cm         LV E/e' lateral: 9.8 LV SV:         125 LV SV Index:   44 LVOT Area:     4.91 cm        3D Volume EF                                LV 3D EF:    Left                                             ventricul                                             ar                                             ejection  fraction                                             by 3D                                             volume is                                             61 %.                                 3D Volume EF:                                3D EF:        61 %                                LV EDV:       292 ml                                LV ESV:       114 ml                                LV SV:        179 ml RIGHT VENTRICLE             IVC RV S prime:     16.60 cm/s  IVC diam: 2.30 cm TAPSE (M-mode): 3.0 cm LEFT ATRIUM             Index        RIGHT ATRIUM           Index LA diam:        5.20 cm 1.84 cm/m   RA Area:     20.40 cm LA Vol (A2C):   66.4 ml 23.52 ml/m  RA Volume:   59.70 ml  21.14 ml/m LA Vol (A4C):   72.5 ml 25.68 ml/m LA Biplane Vol: 71.8 ml 25.43 ml/m  AORTIC VALVE LVOT Vmax:   112.00 cm/s LVOT Vmean:  75.500 cm/s LVOT VTI:    0.254 m  AORTA Ao Root diam: 3.30 cm Ao Asc diam:  3.60 cm MITRAL VALVE MV Area (PHT): 2.95 cm     SHUNTS MV Decel Time: 257 msec     Systemic VTI:  0.25 m MV E velocity: 80.60 cm/s   Systemic Diam: 2.50 cm MV A velocity: 105.00 cm/s MV E/A ratio:  0.77 Armanda Magic MD Electronically signed by Armanda Magic MD  Signature Date/Time: 06/22/2022/4:27:57 PM    Final    CT Chest Wo Contrast  Result Date: 06/14/2022 CLINICAL DATA:  Pneumonia, cough, chest congestion, dyspnea on exertion EXAM: CT CHEST WITHOUT CONTRAST TECHNIQUE: Multidetector CT imaging of the chest was performed  following the standard protocol without IV contrast. RADIATION DOSE REDUCTION: This exam was performed according to the departmental dose-optimization program which includes automated exposure control, adjustment of the mA and/or kV according to patient size and/or use of iterative reconstruction technique. COMPARISON:  04/29/2022 FINDINGS: Cardiovascular: No significant vascular findings. Normal heart size. No pericardial effusion. Mediastinum/Nodes: No enlarged mediastinal or axillary lymph nodes. Thyroid gland, trachea, and esophagus demonstrate no significant findings. Lungs/Pleura: There is nodular ground-glass peribronchial and centrilobular infiltrate within the left lower lobe and, to a lesser extent, within the basilar lingula and right middle lobe in keeping with changes of acute infection the appropriate clinical setting. Superimposed bronchial wall thickening noted in keeping with airway inflammation. Stable parenchymal scarring within the right upper and right middle lobe anteriorly. No pneumothorax or pleural effusion. No central obstructing lesion. Upper Abdomen: No acute abnormality. Musculoskeletal: Osseous structures are age-appropriate. No acute bone abnormality. No lytic or blastic bone lesion. IMPRESSION: 1. Nodular ground-glass peribronchial and centrilobular infiltrate within the left lower lobe and, to a lesser extent, within the basilar lingula and right middle lobe in keeping with changes of acute infection the appropriate clinical setting. No central obstructing lesion. 2. Superimposed bronchial wall thickening in keeping with airway inflammation. 3. Stable parenchymal scarring within the right upper and right middle lobe  anteriorly. Electronically Signed   By: Helyn Numbers M.D.   On: 06/14/2022 20:10   DG Chest 2 View  Result Date: 06/14/2022 CLINICAL DATA:  Shortness of breath, body aches, chills, cough EXAM: CHEST - 2 VIEW COMPARISON:  01/08/2022 FINDINGS: Heart and mediastinal contours are within normal limits. No focal opacities or effusions. No acute bony abnormality. IMPRESSION: No active cardiopulmonary disease. Electronically Signed   By: Charlett Nose M.D.   On: 06/14/2022 16:25    Microbiology: Results for orders placed or performed during the hospital encounter of 06/14/22  SARS Coronavirus 2 by RT PCR (hospital order, performed in [facility] hospital lab) *cepheid single result test* Anterior Nasal Swab     Status: None   Collection Time: 06/14/22  4:35 PM   Specimen: Anterior Nasal Swab  Result Value Ref Range Status   SARS Coronavirus 2 by RT PCR NEGATIVE NEGATIVE Final    Comment: (NOTE) SARS-CoV-2 target nucleic acids are NOT DETECTED.  The SARS-CoV-2 RNA is generally detectable in upper and lower respiratory specimens during the acute phase of infection. The lowest concentration of SARS-CoV-2 viral copies this assay can detect is 250 copies / mL. A negative result does not preclude SARS-CoV-2 infection and should not be used as the sole basis for treatment or other patient management decisions.  A negative result may occur with improper specimen collection / handling, submission of specimen other than nasopharyngeal swab, presence of viral mutation(s) within the areas targeted by this assay, and inadequate number of viral copies (<250 copies / mL). A negative result must be combined with clinical observations, patient history, and epidemiological information.  Fact Sheet for Patients:   RoadLapTop.co.za  Fact Sheet for Healthcare Providers: http://kim-miller.com/  This test is not yet approved or  cleared by the [place] FDA  and has been authorized for detection and/or diagnosis of SARS-CoV-2 by FDA under an Emergency Use Authorization (EUA).  This EUA will remain in effect (meaning this test can be used) for the duration of the COVID-19 declaration under Section 564(b)(1) of the Act, 21 U.S.C. section 360bbb-3(b)(1), unless the authorization is terminated or revoked sooner.  Performed at [facility],  [address] [place], [place] [address]   Group A Strep by PCR     Status: None   Collection Time: 06/14/22  4:35 PM   Specimen: Anterior Nasal Swab; Sterile Swab  Result Value Ref Range Status   Group A Strep by PCR NOT DETECTED NOT DETECTED Final    Comment: Performed at [facility], [address], [place], [place] [address]  Blood culture (routine x 2)     Status: None   Collection Time: 06/14/22  9:00 PM   Specimen: BLOOD  Result Value Ref Range Status   Specimen Description   Final    BLOOD SITE NOT SPECIFIED Performed at [facility], [address], [place], [place] [address]    Special Requests   Final    BOTTLES DRAWN AEROBIC AND ANAEROBIC Blood Culture adequate volume Performed at [facility], [address], [place], [place] [address]    Culture   Final    NO GROWTH 5 DAYS Performed at [facility], [address] [place], [place] [address]    Report Status 06/19/2022 FINAL  Final  Blood culture (routine x 2)     Status: None   Collection Time: 06/14/22  9:20 PM   Specimen: BLOOD  Result Value Ref Range Status   Specimen Description   Final    BLOOD BLOOD LEFT HAND Performed at [facility], [address] [place], [place] [address]    Special Requests   Final    BOTTLES DRAWN AEROBIC AND ANAEROBIC Blood Culture results may not be optimal due to an inadequate volume of blood received in culture bottles Performed at [facility], [address],  [place], [place] [address]    Culture   Final    NO GROWTH 5 DAYS Performed at [facility], [address], [place], [place] [address]    Report Status 06/19/2022 FINAL  Final    Labs: CBC: Recent Labs  Lab 06/21/22 [phone] 06/22/22 0548 06/23/22 0540 06/24/22 0551 06/25/22 0703  WBC 10.1 12.2* 12.6* 10.5 9.2  NEUTROABS  --   --  7.1 6.0 5.7  HGB 12.7* 13.2 12.7* 12.7* 12.8*  HCT 39.9 41.2 39.2 40.1 40.8  MCV 84.9 84.3 83.6 84.6 85.0  PLT 329 344 337 324 325   Basic Metabolic Panel: Recent Labs  Lab 06/21/22 0638 06/22/22 0548 06/23/22 0540 06/24/22 0551 06/25/22 0703  NA 138 135 134* 140 138  K 4.4 4.1 3.1* 4.1 3.6  CL 101 98 97* 102 100  CO2 27 28 27 27 28   GLUCOSE 117* 132* 95 96 95  BUN 20 24* 26* 23* 24*  CREATININE 0.92 1.00 0.86 1.05 1.12  CALCIUM 8.5* 8.8* 8.3* 8.5* 8.5*  MG  --   --  2.4  --   --    Liver Function Tests: Recent Labs  Lab 06/20/22 0543 06/21/22 0638 06/22/22 0548  AST 18 15 13*  ALT 24 26 23   ALKPHOS 54 56 60  BILITOT 0.3 0.3 0.5  PROT 6.4* 6.4* 6.7  ALBUMIN 3.0* 3.2* 3.3*   CBG: No results for input(s): "GLUCAP" in the last 168 hours.  Discharge time spent: greater than 30 minutes.  Signed: Briant Cedar, MD [facility] 06/25/2022

## 2022-06-25 NOTE — TOC Transition Note (Signed)
Transition of Care [facility]) - CM/SW Discharge Note  Patient Details  Name: Christian [name] MRN: [id number] Date of Birth: [birth year]  Transition of Care [facility]) CM/SW Contact:  Ewing Schlein, LCSW Phone Number: 06/25/2022, 2:21 PM  Clinical Narrative: Patient will need transportation to the [facility]. Taxi voucher provided to nursing station. TOC signing off.    Final next level of care: Homeless Shelter [facility]) Barriers to Discharge: Barriers Resolved  Patient Goals and CMS Choice CMS Medicare.gov Compare Post Acute Care list provided to:: Patient Choice offered to / list presented to : Patient  Discharge Plan and Services Additional resources added to the After Visit Summary for   In-house Referral: NA Discharge Planning Services: NA Post Acute Care Choice: NA          DME Arranged: N/A DME Agency: NA  Social Determinants of Health (SDOH) Interventions SDOH Screenings   Food Insecurity: No Food Insecurity (06/19/2022)  Housing: Low Risk  (06/19/2022)  Recent Concern: Housing - Medium Risk (05/11/2022)  Transportation Needs: No Transportation Needs (06/19/2022)  Utilities: Not At Risk (06/19/2022)  Alcohol Screen: Low Risk  (12/24/2020)  Depression (PHQ2-9): Medium Risk (05/11/2022)  Financial Resource Strain: High Risk (05/11/2022)  Physical Activity: Insufficiently Active (05/11/2022)  Social Connections: Moderately Isolated (05/11/2022)  Stress: Stress Concern Present (05/11/2022)  Tobacco Use: Low Risk  (06/19/2022)   Readmission Risk Interventions    06/16/2022    1:32 PM 10/11/2020   12:09 PM  Readmission Risk Prevention Plan  Post Dischage Appt  Complete  Medication Screening  Complete  Transportation Screening Complete Complete  PCP or Specialist Appt within 5-7 Days Complete   Home Care Screening Complete   Medication Review (RN CM) Complete

## 2022-06-25 NOTE — Plan of Care (Signed)

## 2022-06-27 ENCOUNTER — Telehealth: Payer: Self-pay

## 2022-06-27 ENCOUNTER — Other Ambulatory Visit (HOSPITAL_COMMUNITY): Payer: Self-pay

## 2022-06-27 DIAGNOSIS — J9621 Acute and chronic respiratory failure with hypoxia: Secondary | ICD-10-CM

## 2022-06-27 NOTE — Transitions of Care (Post Inpatient/ED Visit) (Signed)
   06/27/2022  Name: Christian [name] MRN: [id number] DOB: 21-Jul-1975  Today's TOC FU Call Status: Today's TOC FU Call Status:: Successful TOC FU Call Competed TOC FU Call Complete Date: 06/27/22  Transition Care Management Follow-up Telephone Call Date of Discharge: 06/25/22 Discharge Facility: [facility]) Type of Discharge: Inpatient Admission Primary Inpatient Discharge Diagnosis:: CAP How have you been since you were released from the hospital?: Better Any questions or concerns?: Yes Patient Questions/Concerns:: He is currently staying at [facility] at night and he just received a call from [facility] to schedule an intake for 07/06/2022.  However, he is not permitted to have an O2 concentrator at [employer].  He uses a CPAP at night but is supposed to use the O2 @ 2L when using the CPAP.  He does not use O2 during the day. A friend is keeping the O2 concentrator for him.  He had been staying in a boarding house but had to leave because he was not able to pay the rent. He has been homeles since the beginning of April and he has no income.  He was denied disability but it currently working with an attorney to re-submit another application.  I asked  him if he had been referred to the TCL program and he said he thinks so but has not heard from anyone.  I told him that I would re-submit a referral and he was in agreement  I also told him that I wlll refer him to Managed Medicaid care management to make sure that he has access to any additional benefits he may be eligibe for. Patient Questions/Concerns Addressed: Notified Provider of Patient Questions/Concerns  Items Reviewed: Did you receive and understand the discharge instructions provided?: Yes Medications obtained and verified?: Yes (Medications Reviewed) (He said he has all medications except the Mucinex DM.  he did not have any questions about the med regime.) Any new allergies since your discharge?: No Dietary orders reviewed?: No Do  you have support at home?: No (currently homeless, staying at [facility])  Home Care and Equipment/Supplies: Were Home Health Services Ordered?: No Any new equipment or medical supplies ordered?: Yes Name of Medical supply agency?: [employer]- cane Were you able to get the equipment/medical supplies?: Yes Do you have any questions related to the use of the equipment/supplies?: No  Functional Questionnaire: Do you need assistance with bathing/showering or dressing?: No Do you need assistance with meal preparation?: No Do you need assistance with eating?: No Do you have difficulty maintaining continence: No Do you need assistance with getting out of bed/getting out of a chair/moving?: Yes (uses cane with ambulation) Do you have difficulty managing or taking your medications?: No  Follow up appointments reviewed: PCP Follow-up appointment confirmed?: Yes Date of PCP follow-up appointment?: 07/05/22 Follow-up Provider: Dr Andrey Campanile [facility] Follow-up appointment confirmed?: Yes Date of Specialist follow-up appointment?: 07/01/22 Follow-Up Specialty Provider:: cardiology,  08/29/2022- pulmonary Do you need transportation to your follow-up appointment?: No (he uses transportation provided by his insurance company or will take the bus.) Do you understand care options if your condition(s) worsen?: Yes-patient verbalized understanding    SIGNATURE Robyne Peers, RN

## 2022-06-27 NOTE — Telephone Encounter (Signed)
From the discharge call:   He stated he is feeling better since his discharge from the hospital.    He is currently staying at [facility] at night and he just received a call from [facility] to schedule an intake for 07/06/2022.  However, he is not permitted to have an O2 concentrator at [employer].  He uses a CPAP at night but is supposed to use the O2 @ 2L when using the CPAP.  He does not use O2 during the day. A friend is keeping the O2 concentrator for him.    He had been staying in a boarding house but had to leave because he was not able to pay the rent. He has been homeles since the beginning of April and he has no income.  He was denied disability but it currently working with an attorney to re-submit another application.  I asked  him if he had been referred to the TCL program and he said he thinks so but has not heard from anyone.  I told him that I would re-submit a referral and he was in agreement  I also told him that I wlll refer him to Managed Medicaid care management to make sure that he has access to any additional benefits he may be eligibe for.  He said he has all medications except the Mucinex DM.  he did not have any questions about the med regime.   Follow-up Provider: Dr Andrey Campanile - 07/05/2022.  Specialist Hospital Follow-up appointment confirmed?: Yes

## 2022-06-28 ENCOUNTER — Ambulatory Visit (INDEPENDENT_AMBULATORY_CARE_PROVIDER_SITE_OTHER): Payer: Medicaid Other | Admitting: Licensed Clinical Social Worker

## 2022-06-28 ENCOUNTER — Ambulatory Visit (HOSPITAL_COMMUNITY): Payer: Medicaid Other | Admitting: Licensed Clinical Social Worker

## 2022-06-28 DIAGNOSIS — F331 Major depressive disorder, recurrent, moderate: Secondary | ICD-10-CM

## 2022-06-28 DIAGNOSIS — F411 Generalized anxiety disorder: Secondary | ICD-10-CM

## 2022-06-28 NOTE — Progress Notes (Signed)
THERAPIST PROGRESS NOTE  Session Time: 11  Participation Level: Active  Behavioral Response: CasualAlertAnxious and Depressed  Type of Therapy: Individual Therapy  Treatment Goals addressed:  Active     Anxiety     LTG: Christian [name] will score less than 5 on the Generalized Anxiety Disorder 7 Scale (GAD-7)  (Progressing)     Start:  05/11/22    Expected End:  10/28/22         STG: Christian [name] will complete at least 80% of assigned homework  (Progressing)     Start:  05/11/22    Expected End:  10/28/22         STG: Christian [name] will practice problem solving skills 3 times per week for the next 4 weeks.      Start:  05/11/22    Expected End:  10/28/22         identify 3 trigger for anxiety  (Progressing)     Start:  05/11/22    Expected End:  10/28/22       Goal Note     Housing          create  3 coping skills for depression      Start:  05/11/22    Expected End:  10/28/22         Review results of GAD-7 with Christian [name] to track progress (Completed)     Start:  05/11/22    End:  06/28/22    Intervention Note     Reviewed with patient during session.            OP Depression     LTG: Reduce frequency, intensity, and duration of depression symptoms so that daily functioning is improved (Progressing)     Start:  05/11/22    Expected End:  10/28/22         LTG: Increase coping skills to manage depression and improve ability to perform daily activities (Progressing)     Start:  05/11/22    Expected End:  10/28/22         LTG: Christian [name] will score less than 10 on the Patient Health Questionnaire (PHQ-9)  (Progressing)     Start:  05/11/22    Expected End:  10/28/22            ProgressTowards Goals: Progressing  Interventions: CBT, Motivational Interviewing, and Supportive  Summary: Christian [name] is a 47 y.o. male who presents with depressed and anxious mood\affect.  Patient was pleasant, cooperative, maintained good eye contact.  He engaged well in therapy  session was dressed casually.  Patient started off today by apologizing for missing his 10 AM appointment this morning.  Patient did call [facility] to notify him that he missed the bus stop and was unable to attend.  LCSW did offer patient a 2 PM slot and patient was on time for that.   Patient reports stressors for housing.  Patient reports staying at the [facility] in [place] [facility].  This is not a shelter this is a Water engineer for the homeless population in [place] but they do allow people to stay on the floors as a last resort for shelter.  Patient has been sleeping on the floor and has options with [facility] through housing programs.   Patient showed LCSW email from [facility] asking for a comprehensive clinical assessment in the last year and also a treatment plan and up-to-date.  LCSW forwarded the information via PDF to patient email indicated in epic.  LCSW confirmed with patient that he received that email.  Patient to forward that information on to [employer].  Neck step for patient will be to contact [facility] as there is a form that needs to be filled out by case manager through [employer]. Suicidal/Homicidal: Nowithout intent/plan  Therapist Response:     Interventions/Plan: LCSW psychoanalytic therapy for patient to express thoughts, feelings and emotions.  LCSW solution focused therapy to provide patient with comprehensive clinical assessment and treatment plan as indicated by [facility] for housing application.  Plan for patient is to call [employer] to get forms indicated for [facility] from them.  LCSW used supportive therapy for praise and encouragement.  LCSW used motivational interviewing for open-ended questions and reflective listening.  Plan: Return again in 3 weeks.  Diagnosis: GAD (generalized anxiety disorder)  MDD (major depressive disorder), recurrent episode, moderate (HCC)  Collaboration of Care: Other None today    Patient/Guardian was advised Release of Information must be obtained prior to any record release in order to collaborate their care with an outside provider. Patient/Guardian was advised if they have not already done so to contact the registration department to sign all necessary forms in order for [place] to release information regarding their care.   Consent: Patient/Guardian gives verbal consent for treatment and assignment of benefits for services provided during this visit. Patient/Guardian expressed understanding and agreed to proceed.   Weber Cooks, LCSW 06/28/2022

## 2022-07-04 ENCOUNTER — Other Ambulatory Visit: Payer: Medicaid Other

## 2022-07-04 NOTE — Patient Outreach (Signed)
Medicaid Managed Care Social Work Note  07/04/2022 Name:  Christian [name] MRN:  [id number] DOB:  May 01, 1975  Christian [name] is an 47 y.o. year old male who is a primary patient of [name], MD.  The [facility] Managed Care Coordination team was consulted for assistance with:   housing  [name] was given information about Medicaid Managed Care Coordination team services today. [name] Patient agreed to services and verbal consent obtained.  Engaged with patient  for by telephone forinitial visit in response to referral for case management and/or care coordination services.   Assessments/Interventions:  Review of past medical history, allergies, medications, health status, including review of consultants reports, laboratory and other test data, was performed as part of comprehensive evaluation and provision of chronic care management services.  SDOH: (Social Determinant of Health) assessments and interventions performed: SDOH Interventions    Flowsheet Row Telephone from 06/27/2022 in [name] ED to Hosp-Admission (Discharged) from [facility]/14/2022 in [name] Office Visit from 12/24/2020 [name]  SDOH Interventions     Food Insecurity Interventions -- Intervention Not Indicated Other (Comment)  [gets EBT and meals at the homeless shelter]  Housing Interventions Other (Comment)  [referred to [employer] and managed medicaid care management] Intervention Not Indicated Other (Comment)  [patient lives in homeless shelter]  Transportation Interventions -- Intervention Not Indicated Other (Comment)  [patient has a car, but used ACT today]  Utilities Interventions -- Intervention Not Indicated --  Financial Strain Interventions Other (Comment)  [currently working with an attorney to resubit an application for disability.] -- --      BSW completed a telephone outreach with patient. He states he  has tried all of the housing resources BSW was going to provide. He did recently have his Medicaid switched over to [facility] and is needing assistance with getting Medicaid Direct. BSW contacted DSS to check the status of the switch but was unable to speak with anyone. BSW will make another attempt. Patient is receiving foodstamps and has a intake meeting on 07/06/22 with [facility]. Advanced Directives Status:  Not addressed in this encounter.  Care Plan                 Allergies  Allergen Reactions   Shellfish Allergy Nausea Only    Medications Reviewed Today     Reviewed by [name], CPhT (Pharmacy Technician) on 06/14/22 at 2346  Med List Status: Complete   Medication Order Taking? Sig Documenting Provider Last Dose Status Informant  albuterol (VENTOLIN HFA) 108 (90 Base) MCG/ACT inhaler [id number] Yes Inhale 2 puffs into the lungs every 6 (six) hours as needed for wheezing or shortness of breath. Parrett, Virgel Bouquet, NP unknown Active Self  cholecalciferol (VITAMIN D3) tablet 1,000 Units [id number]   Regalado, Belkys A, MD  Active   fluticasone (FLONASE) 50 MCG/ACT nasal spray [id number] Yes Spray 2 sprays into both nostrils once daily. [name], MD 06/14/2022 Active Self  meloxicam (MOBIC) 15 MG tablet [id number] Yes Take 1 tablet (15 mg total) by mouth daily. [name], MD 06/14/2022 Active Self  OXYGEN [id number]  Inhale 2 L/min into the lungs at bedtime. [provider]  Active Self  sertraline (ZOLOFT) 100 MG tablet [id number] Yes Take 150 mg by mouth daily. [provider] 06/14/2022 Active Self  sertraline (ZOLOFT) 50 MG tablet [id number] No Take 3 tablets (150 mg total) by mouth daily.  Patient not taking: Reported on 06/14/2022   Meta Hatchet, PA Not Taking Active Self  torsemide (DEMADEX) 20 MG tablet [id number] Yes Take 1 tablet (20 mg total) by mouth 2 (two) times daily. [name], MD 06/14/2022 Active Self  vitamin B-12  (CYANOCOBALAMIN) 100 MCG tablet [id number] No Take 1 tablet (100 mcg total) by mouth once daily.  Patient not taking: Reported on 06/14/2022   [name], MD Completed Course Active Self            Patient Active Problem List   Diagnosis Date Noted   CAP (community acquired pneumonia) 06/14/2022   Lung nodules 05/05/2022   GAD (generalized anxiety disorder) 03/30/2022   Inadequate community resources 03/29/2022   MDD (major depressive disorder), recurrent episode, moderate (HCC) 03/29/2022   Housing instability, currently housed, at risk for homelessness 03/29/2022   Dyspnea 03/18/2022   OSA (obstructive sleep apnea) 03/17/2022   COVID 01/07/2022   Nocturnal hypoxia    Sepsis without acute organ dysfunction (HCC)    Influenza A 01/15/2021   Homelessness 01/15/2021   Hypokalemia 01/15/2021   Cellulitis and abscess of buttock 01/08/2021   Prediabetes 01/08/2021   Pleural effusion 11/25/2020   Chronic diastolic CHF (congestive heart failure) (HCC) 11/25/2020   Depression 11/25/2020   Obesity hypoventilation syndrome (HCC) 10/17/2020   Acute on chronic respiratory failure with hypoxia and hypercapnia (HCC)    Bowel perforation (HCC)    Hypotension    Acute respiratory failure with hypoxemia (HCC)    Weakness    Cellulitis of abdominal wall 10/10/2020   Impaired fasting glucose    Hyperglycemia 10/09/2020   AKI (acute kidney injury) (HCC) 10/09/2020   Essential hypertension 10/09/2020   Chest pain 04/06/2011   Morbid obesity with BMI of 50.0-59.9, adult (HCC)    Elevated blood pressure reading without diagnosis of hypertension     Conditions to be addressed/monitored per PCP order:   housing  There are no care plans that you recently modified to display for this patient.   Follow up:  Patient agrees to Care Plan and Follow-up.  Plan: The Managed Medicaid care management team will reach out to the patient again over the next 30 days.  Date/time of next  scheduled Social Work care management/care coordination outreach:  08/04/22  [name], [name], [facility]  Managed [facility] Social Worker [phone]

## 2022-07-04 NOTE — Patient Instructions (Signed)
Visit Information  Mr. Hupe was given information about Medicaid Managed Care team care coordination services as a part of their [facility] Community Plan Medicaid benefit. Andres Ege Delima verbally consented to engagement with the [facility] Managed Care team.   If you are experiencing a medical emergency, please call 911 or report to your local emergency department or urgent care.   If you have a non-emergency medical problem during routine business hours, please contact your provider's office and ask to speak with a nurse.   For questions related to your [facility], please call: [phone] or visit the homepage here: [email or web address]  If you would like to schedule transportation through your [facility], please call the following number at least 2 days in advance of your appointment: [phone]   Rides for urgent appointments can also be made after hours by calling Member Services.  Call the Behavioral Health Crisis Line at [phone], at any time, 24 hours a day, 7 days a week. If you are in danger or need immediate medical attention call 911.  If you would like help to quit smoking, call 1-800-QUIT-NOW ((310)407-0467) OR Espaol: 1-855-Djelo-Ya (4-403-474-2595) o para ms informacin haga clic aqu or Text READY to 638-756 to register via text  Mr. Derstine - following are the goals we discussed in your visit today:   Goals Addressed   None       Social Worker will follow up in 30 days.   [name], [name], MHA [facility] Health  Managed Medicaid Social Worker [phone]   Following is a copy of your plan of care:  There are no care plans that you recently modified to display for this patient.

## 2022-07-05 ENCOUNTER — Ambulatory Visit (INDEPENDENT_AMBULATORY_CARE_PROVIDER_SITE_OTHER): Payer: Medicaid Other | Admitting: Family Medicine

## 2022-07-05 VITALS — BP 128/81 | HR 80 | Temp 98.1°F | Resp 18 | Wt 393.0 lb

## 2022-07-05 DIAGNOSIS — R0602 Shortness of breath: Secondary | ICD-10-CM | POA: Diagnosis not present

## 2022-07-05 DIAGNOSIS — Z09 Encounter for follow-up examination after completed treatment for conditions other than malignant neoplasm: Secondary | ICD-10-CM

## 2022-07-05 DIAGNOSIS — I10 Essential (primary) hypertension: Secondary | ICD-10-CM

## 2022-07-05 DIAGNOSIS — G4733 Obstructive sleep apnea (adult) (pediatric): Secondary | ICD-10-CM | POA: Diagnosis not present

## 2022-07-05 DIAGNOSIS — Z5900 Homelessness unspecified: Secondary | ICD-10-CM | POA: Diagnosis not present

## 2022-07-05 NOTE — Progress Notes (Signed)
-  Homeless -HFU pneumonia -Patient said he is doing better since getting out hospital

## 2022-07-06 ENCOUNTER — Encounter: Payer: Self-pay | Admitting: Physician Assistant

## 2022-07-06 ENCOUNTER — Encounter: Payer: Self-pay | Admitting: *Deleted

## 2022-07-06 ENCOUNTER — Other Ambulatory Visit: Payer: Self-pay

## 2022-07-06 ENCOUNTER — Other Ambulatory Visit: Payer: Self-pay | Admitting: Physician Assistant

## 2022-07-06 DIAGNOSIS — Z139 Encounter for screening, unspecified: Secondary | ICD-10-CM

## 2022-07-06 MED ORDER — CEFDINIR 300 MG PO CAPS
300.0000 mg | ORAL_CAPSULE | Freq: Two times a day (BID) | ORAL | 0 refills | Status: DC
  Filled 2022-07-06: qty 10, 5d supply, fill #0

## 2022-07-06 NOTE — Progress Notes (Cosign Needed)
Pt seen by Dr Sunnie Nielsen  Pt was hospitalized from 04/16-04/27 2nd sepsis, LLL PNA, resp failure, HFpEF  01/08/2021-02/12/2021 - sepsis influenza A, PNA,  pressure ulcers from living in his car  08/12-10/04/2020 - sepsis 2nd abd wall cellulitis >> cecal perforation,  surg, pleural  s/p thoracentesis  Hx HTN, depression, OSA on CPAP.   Has a PCP, Dr [name], saw her 05/07  Today, he is here for coughing, he will break out in a sweat from standing for a time  Wt at d/c 357.72, wt today 389.2 lbs.  118/83, 81, O2 sats 90% on room air  Is trying to eat a reduced salt diet, does not add salt to foods. Not weighing daily, not able to do so here.   He skipped the am torsemide today, but says does not normally do this.   Lung sounds have some rhonchi and rales. +LE edema, not able to assess neck veins.   Has knee pain, has had cortisone shots in the past. Was given lidocaine patches.   Still coughing, does not normally cough anything up.  Was given guaifenisen for his cough, 1200 mg bid.   Keep scheduled f/u appts w/ Cards, Pulm.  Will try to move up appt w/ PCP, he seems to need additional diuresis.    Christian Demark, PA-C 07/06/2022 3:13 PM   Also plan 5 days of cefdinir

## 2022-07-06 NOTE — Congregational Nurse Program (Signed)
  Dept: [phone]   Congregational Nurse Program Note  Date of Encounter: 07/06/2022  Past Medical History: Past Medical History:  Diagnosis Date   Allergic rhinitis    Anxiety    CHF (congestive heart failure) (HCC)    Depression    Depression    Elevated blood pressure reading without diagnosis of hypertension    History of chicken pox    Obesity    Pleural effusion    Sepsis (HCC)    Sleep apnea    O2 at night    Encounter Details:  CNP Questionnaire - 07/06/22 1356       Questionnaire   Ask client: Do you give verbal consent for me to treat you today? Yes    Student Assistance N/A    Location Patient Served  [facility]    Visit Setting with Client Organization    Patient Status Unhoused    Insurance Medicaid    Insurance/Financial Assistance Referral N/A    Medication N/A    Medical Provider Yes    Screening Referrals Made N/A    Medical Referrals Made [facility]    Medical Appointment Made [facility] PCP[facility]    Food N/A    Transportation N/A    Housing/Utilities No permanent housing    Interpersonal Safety N/A    Interventions Advocate/Support    Abnormal to Normal Screening Since Last CN Visit N/A    Screenings CN Performed Blood Pressure;Blood Glucose;Pulse Ox    Sent Client to Lab for: N/A    Did client attend any of the following based off CNs referral or appointments made? N/A    ED Visit Averted N/A    Life-Saving Intervention Made N/A            Client seen at [facility]. Client reports he was recently in the hospital with Pneumonia. He has a PCP and BH doctor assisting him. Checked vitals and cbg. Blood pressure (!) 162/93, pulse 88, SpO2 94 %. CBG 132. Client reports he has been coughing more in past few days and has been having a headache. Dr Delford Field is seeing clients at [facility] today. Sent referral to him due to elevated BP and headaches. Offered support and encouragement.  Rosalind Guido W RN CN

## 2022-07-08 NOTE — Progress Notes (Unsigned)
Established Patient Office Visit  Subjective    Patient ID: Christian [name], male    DOB: 1975-08-17  Age: 47 y.o. MRN: [id number]  CC:  Chief Complaint  Patient presents with   Follow-up    HFU    HPI Christian [name] presents for hospital follow up where he was dx with pneumonia. He reports that he is improved. He also reports that he is currently homeless and unable to use oxygen at the facility that he is currently staying in.    Outpatient Encounter Medications as of 07/05/2022  Medication Sig   albuterol (VENTOLIN HFA) 108 (90 Base) MCG/ACT inhaler Inhale 2 puffs into the lungs every 6 (six) hours as needed for wheezing or shortness of breath.   fluticasone (FLONASE) 50 MCG/ACT nasal spray Spray 2 sprays into both nostrils once daily.   meloxicam (MOBIC) 15 MG tablet Take 1 tablet (15 mg total) by mouth daily.   OXYGEN Inhale 2 L/min into the lungs at bedtime.   sertraline (ZOLOFT) 100 MG tablet Take 150 mg by mouth daily.   torsemide (DEMADEX) 20 MG tablet Take 1 tablet (20 mg total) by mouth 2 (two) times daily.   vitamin B-12 (CYANOCOBALAMIN) 100 MCG tablet Take 1 tablet (100 mcg total) by mouth once daily. (Patient not taking: Reported on 06/14/2022)   Facility-Administered Encounter Medications as of 07/05/2022  Medication   cholecalciferol (VITAMIN D3) tablet 1,000 Units    Past Medical History:  Diagnosis Date   Allergic rhinitis    Anxiety    CHF (congestive heart failure) (HCC)    Depression    Depression    Elevated blood pressure reading without diagnosis of hypertension    History of chicken pox    Obesity    Pleural effusion    Sepsis (HCC)    Sleep apnea    O2 at night    Past Surgical History:  Procedure Laterality Date   COLON SURGERY     perforated bowel   TONSILLECTOMY AND ADENOIDECTOMY  03/01/1983    Family History  Problem Relation Age of Onset   Stroke Mother    Diabetes Mother        pre-diabetes   Heart disease Mother         CHF, pacer   Cancer Mother        lymphoma   Stomach cancer Mother    Clotting disorder Mother    Coronary artery disease Father 33       MI   Hypertension Father    Hyperlipidemia Father    Diabetes Father    Heart attack Father    Migraines Sister    Stroke Paternal Grandmother    Diabetes Paternal Grandmother    Heart disease Paternal Grandfather    Parkinson's disease Paternal Grandfather    Heart attack Paternal Grandfather     Social History   Socioeconomic History   Marital status: Single    Spouse name: Not on file   Number of children: 0   Years of education: Not on file   Highest education level: 12th grade  Occupational History   Not on file  Tobacco Use   Smoking status: Never   Smokeless tobacco: Never   Tobacco comments:    Living in homeless shelter  Vaping Use   Vaping Use: Never used  Substance and Sexual Activity   Alcohol use: Yes    Alcohol/week: 2.0 standard drinks of alcohol    Types: 2 Cans of beer  per week    Comment: 1-2 beers 1-2 days a week   Drug use: Never   Sexual activity: Not Currently  Other Topics Concern   Not on file  Social History Narrative   Caffeine: occasional   Lives alone, near parents.     Occupation: works at [employer] part time   Edu: some college   Diet: some water, some fruits/vegetables, 1-2x/wk red meat, fish 1x/wk   Social Determinants of Health   Financial Resource Strain: High Risk (07/01/2022)   Overall Financial Resource Strain (CARDIA)    Difficulty of Paying Living Expenses: Very hard  Food Insecurity: Food Insecurity Present (07/01/2022)   Hunger Vital Sign    Worried About Running Out of Food in the Last Year: Sometimes true    Ran Out of Food in the Last Year: Sometimes true  Transportation Needs: Unmet Transportation Needs (07/01/2022)   PRAPARE - Administrator, Civil Service (Medical): Yes    Lack of Transportation (Non-Medical): Yes  Physical Activity: Inactive (07/01/2022)    Exercise Vital Sign    Days of Exercise per Week: 0 days    Minutes of Exercise per Session: 20 min  Stress: Stress Concern Present (07/01/2022)   [employer] of Occupational Health - Occupational Stress Questionnaire    Feeling of Stress : To some extent  Social Connections: Moderately Integrated (07/01/2022)   Social Connection and Isolation Panel [NHANES]    Frequency of Communication with Friends and Family: More than three times a week    Frequency of Social Gatherings with Friends and Family: Once a week    Attends Religious Services: More than 4 times per year    Active Member of [employer] or Organizations: Yes    Attends Banker Meetings: 1 to 4 times per year    Marital Status: Never married  Recent Concern: Social Connections - Moderately Isolated (05/11/2022)   Social Connection and Isolation Panel [NHANES]    Frequency of Communication with Friends and Family: More than three times a week    Frequency of Social Gatherings with Friends and Family: Twice a week    Attends Religious Services: More than 4 times per year    Active Member of [employer] or Organizations: No    Attends Banker Meetings: Never    Marital Status: Never married  Intimate Partner Violence: Not At Risk (06/19/2022)   Humiliation, Afraid, Rape, and Kick questionnaire    Fear of Current or Ex-Partner: No    Emotionally Abused: No    Physically Abused: No    Sexually Abused: No    Review of Systems  All other systems reviewed and are negative.       Objective    BP 128/81   Pulse 80   Temp 98.1 F (36.7 C) (Oral)   Resp 18   Wt (!) 393 lb (178.3 kg)   SpO2 92%   BMI 50.46 kg/m   Physical Exam Vitals and nursing note reviewed.  Constitutional:      General: He is not in acute distress.    Appearance: He is obese.  Cardiovascular:     Rate and Rhythm: Normal rate and regular rhythm.  Pulmonary:     Effort: Pulmonary effort is normal. No respiratory distress.      Breath sounds: Normal breath sounds. No wheezing.  Abdominal:     Palpations: Abdomen is soft.     Tenderness: There is no abdominal tenderness.  Musculoskeletal:  Right lower leg: No edema.     Left lower leg: No edema.  Neurological:     General: No focal deficit present.     Mental Status: He is alert and oriented to person, place, and time.  Psychiatric:        Mood and Affect: Mood and affect normal.        Speech: Speech normal.        Behavior: Behavior normal. Behavior is cooperative.         Assessment & Plan:   1. Homeless Currently staying at [facility]  2. Obstructive sleep apnea Utilizing cpap  3. Essential hypertension Appears stable. continue  4. Hospital discharge follow-up   5. SOB (shortness of breath) improved    No follow-ups on file.   Tommie Raymond, MD

## 2022-07-10 DIAGNOSIS — G4733 Obstructive sleep apnea (adult) (pediatric): Secondary | ICD-10-CM | POA: Diagnosis not present

## 2022-07-11 ENCOUNTER — Encounter: Payer: Self-pay | Admitting: *Deleted

## 2022-07-11 ENCOUNTER — Telehealth (HOSPITAL_COMMUNITY): Payer: Self-pay | Admitting: *Deleted

## 2022-07-11 NOTE — Telephone Encounter (Signed)
Christian [name] from [facility] # [phone] Called requesting documention to support diagnosis -- stated a faxed ROI sent on 5-10 And supported documention needed by 5-16 by 3 pm.  Called back LVM to inform Jasmine December I don't see any documention in our system concerning mentioned patient

## 2022-07-11 NOTE — Congregational Nurse Program (Signed)
  Dept: [phone]   Congregational Nurse Program Note  Date of Encounter: 07/11/2022  Past Medical History: Past Medical History:  Diagnosis Date   Allergic rhinitis    Anxiety    CHF (congestive heart failure) (HCC)    Depression    Depression    Elevated blood pressure reading without diagnosis of hypertension    History of chicken pox    Obesity    Pleural effusion    Sepsis (HCC)    Sleep apnea    O2 at night    Encounter Details:  CNP Questionnaire - 07/11/22 1124       Questionnaire   Ask client: Do you give verbal consent for me to treat you today? Yes    Student Assistance N/A    Location Patient Served  [facility]    Visit Setting with Client Organization    Patient Status Unhoused    Insurance Medicaid    Insurance/Financial Assistance Referral N/A    Medication N/A    Screening Referrals Made N/A    Medical Referrals Made N/A    Medical Appointment Made N/A    Recently w/o PCP, now 1st time PCP visit completed due to CNs referral or appointment made N/A    Food N/A    Transportation Need transportation assistance    Housing/Utilities No permanent housing    Interventions Advocate/Support    Abnormal to Normal Screening Since Last CN Visit N/A    Screenings CN Performed N/A    Sent Client to Lab for: N/A    Did client attend any of the following based off CNs referral or appointments made? N/A    ED Visit Averted N/A    Life-Saving Intervention Made N/A            Client seen at [facility]. Client reports he is feeling better, less pain and sleeping better at night. He has two appointments tomorrow and has medicaid transportation to one appointment. He asked about transportation to other appt. Spoke with CM and bus passes offered. Client currently has a 31 day bus pass. Offered support and encouragement.  Carlis Burnsworth W RN CN

## 2022-07-12 ENCOUNTER — Ambulatory Visit (INDEPENDENT_AMBULATORY_CARE_PROVIDER_SITE_OTHER): Payer: Medicaid Other | Admitting: Family Medicine

## 2022-07-12 ENCOUNTER — Other Ambulatory Visit: Payer: Self-pay

## 2022-07-12 ENCOUNTER — Encounter (HOSPITAL_COMMUNITY): Payer: Self-pay | Admitting: Physician Assistant

## 2022-07-12 ENCOUNTER — Telehealth (HOSPITAL_COMMUNITY): Payer: Self-pay | Admitting: *Deleted

## 2022-07-12 ENCOUNTER — Ambulatory Visit (INDEPENDENT_AMBULATORY_CARE_PROVIDER_SITE_OTHER): Payer: Medicaid Other | Admitting: Physician Assistant

## 2022-07-12 ENCOUNTER — Telehealth (HOSPITAL_COMMUNITY): Payer: Self-pay | Admitting: Licensed Clinical Social Worker

## 2022-07-12 ENCOUNTER — Encounter: Payer: Self-pay | Admitting: Family Medicine

## 2022-07-12 VITALS — BP 131/84 | HR 79 | Temp 97.6°F | Resp 16 | Wt 395.0 lb

## 2022-07-12 VITALS — BP 131/80 | HR 79 | Temp 98.2°F | Ht 74.0 in | Wt 395.6 lb

## 2022-07-12 DIAGNOSIS — I10 Essential (primary) hypertension: Secondary | ICD-10-CM

## 2022-07-12 DIAGNOSIS — F331 Major depressive disorder, recurrent, moderate: Secondary | ICD-10-CM | POA: Diagnosis not present

## 2022-07-12 DIAGNOSIS — I5032 Chronic diastolic (congestive) heart failure: Secondary | ICD-10-CM | POA: Diagnosis not present

## 2022-07-12 DIAGNOSIS — R609 Edema, unspecified: Secondary | ICD-10-CM

## 2022-07-12 DIAGNOSIS — F411 Generalized anxiety disorder: Secondary | ICD-10-CM | POA: Diagnosis not present

## 2022-07-12 MED ORDER — SERTRALINE HCL 100 MG PO TABS
150.0000 mg | ORAL_TABLET | Freq: Every day | ORAL | 2 refills | Status: DC
Start: 2022-07-12 — End: 2022-10-14
  Filled 2022-07-12: qty 45, 30d supply, fill #0
  Filled 2022-08-20: qty 45, 30d supply, fill #1
  Filled 2022-09-17: qty 45, 30d supply, fill #2

## 2022-07-12 MED ORDER — TORSEMIDE 20 MG PO TABS
60.0000 mg | ORAL_TABLET | Freq: Two times a day (BID) | ORAL | 3 refills | Status: DC
  Filled 2022-07-12: qty 180, 45d supply, fill #0
  Filled 2022-07-13: qty 120, 30d supply, fill #0

## 2022-07-12 MED ORDER — TRAZODONE HCL 50 MG PO TABS
50.0000 mg | ORAL_TABLET | Freq: Every day | ORAL | 2 refills | Status: DC
Start: 2022-07-12 — End: 2022-10-14
  Filled 2022-07-12: qty 30, 30d supply, fill #0
  Filled 2022-08-03: qty 30, 30d supply, fill #1

## 2022-07-12 MED ORDER — TORSEMIDE 60 MG PO TABS
1.0000 | ORAL_TABLET | Freq: Two times a day (BID) | ORAL | 0 refills | Status: DC
  Filled 2022-07-12: qty 60, 30d supply, fill #0

## 2022-07-12 NOTE — Progress Notes (Signed)
BH MD/PA/NP OP Progress Note  07/12/2022 1:46 PM Ardith Mulet  MRN:  [id number]  Chief Complaint:  Chief Complaint  Patient presents with   Follow-up   Medication Refill   HPI:   Derrall Braccia. Degolier is a 47 year old, Caucasian male with a past psychiatric history significant for anxiety and major depressive disorder who presents to [facility] for follow-up and medication management.  Patient is currently being managed on the following psychiatric medication: Sertraline 150 mg daily.  Patient reports that he occasionally experiences drowsiness towards the afternoon and is unsure if the cause of his drowsiness is due to the use of his sertraline.  Patient denies experiencing any other severe adverse side effects.  Despite his drowsiness, patient reports that he continues to take his medication regularly.  Patient reports that he was previously hospitalized due to having pneumonia.  During his hospitalization, patient states that he was given a regular supply of his sertraline.  Patient reports that he is also given trazodone while hospitalized and stated that it helped with managing his sleep.  Patient is interested in being placed back on trazodone to help him with sleep throughout the night.  Patient reports that he experienced some weight gain after being discharged from the hospital.  He recently went to his primary care provider and found out that he is retaining more fluid.  Since his last encounter, patient reports that his living situation has drastically changed.  Patient states that he ended up being kicked out of his previous place of residence and was living at the [facility] for some time.  Patient reports that he is now living at [facility] and has been at that location for roughly a week.  Patient reports that he will be living there for roughly 3 months before he has to figure out where he has to live next.  In regards to his mental health,  patient endorses minimal depression.  Patient still continues to endorse anxiety with most of his anxiety attributed to trying to obtain disability.  Patient states that he has recently turned in all his disability forms and his application is slowly getting processed.  A GAD-7 screen was performed with the patient scoring an 8.  Patient is alert and oriented x 4, calm, cooperative, and fully engaged in conversation during the encounter.  Patient reports that he is optimistic.  Patient denies suicidal or homicidal ideations.  He further denies auditory or visual hallucinations and does not appear to be responding to internal/external stimuli.  Patient endorses fair sleep and receives on average 6 hours of sleep per night.  Patient endorses good appetite and eats on average 3 meals per day.  Patient denies alcohol consumption, tobacco use, and illicit drug use.  Visit Diagnosis:    ICD-10-CM   1. GAD (generalized anxiety disorder)  F41.1 sertraline (ZOLOFT) 100 MG tablet    2. MDD (major depressive disorder), recurrent episode, moderate (HCC)  F33.1 sertraline (ZOLOFT) 100 MG tablet    traZODone (DESYREL) 50 MG tablet      Past Psychiatric History:  Patient reports that when he was first hospitalized due to mental health, he was diagnosed with anxiety and depression. Patient states that he was started on antidepressants during that time but never stayed consistent with his medication management   Past Medical History:  Past Medical History:  Diagnosis Date   Allergic rhinitis    Anxiety    CHF (congestive heart failure) (HCC)  Depression    Depression    Elevated blood pressure reading without diagnosis of hypertension    History of chicken pox    Obesity    Pleural effusion    Sepsis (HCC)    Sleep apnea    O2 at night    Past Surgical History:  Procedure Laterality Date   COLON SURGERY     perforated bowel   TONSILLECTOMY AND ADENOIDECTOMY  03/01/1983    Family Psychiatric  History:  Mother - patient reports that his mother had cancer when he was young and she was referred to a mental health facility Uncle (paternal) - possible schizophrenia, patient reports that his family suspected that his uncle may have been schizophrenic because he would see things   Family history of suicide: Patient denies, but states that his mother's, who was adopted, cousin attempted suicide.  Patient states that they were not blood related. Family history of homicide: Patient denies Family history of substance abuse: Patient denies  Family History:  Family History  Problem Relation Age of Onset   Stroke Mother    Diabetes Mother        pre-diabetes   Heart disease Mother        CHF, pacer   Cancer Mother        lymphoma   Stomach cancer Mother    Clotting disorder Mother    Coronary artery disease Father 22       MI   Hypertension Father    Hyperlipidemia Father    Diabetes Father    Heart attack Father    Migraines Sister    Stroke Paternal Grandmother    Diabetes Paternal Grandmother    Heart disease Paternal Grandfather    Parkinson's disease Paternal Grandfather    Heart attack Paternal Grandfather     Social History:  Social History   Socioeconomic History   Marital status: Single    Spouse name: Not on file   Number of children: 0   Years of education: Not on file   Highest education level: 12th grade  Occupational History   Not on file  Tobacco Use   Smoking status: Never   Smokeless tobacco: Never   Tobacco comments:    Living in homeless shelter  Vaping Use   Vaping Use: Never used  Substance and Sexual Activity   Alcohol use: Yes    Alcohol/week: 2.0 standard drinks of alcohol    Types: 2 Cans of beer per week    Comment: 1-2 beers 1-2 days a week   Drug use: Never   Sexual activity: Not Currently  Other Topics Concern   Not on file  Social History Narrative   Caffeine: occasional   Lives alone, near parents.     Occupation: works at  [employer] part time   Edu: some college   Diet: some water, some fruits/vegetables, 1-2x/wk red meat, fish 1x/wk   Social Determinants of Health   Financial Resource Strain: High Risk (07/01/2022)   Overall Financial Resource Strain (CARDIA)    Difficulty of Paying Living Expenses: Very hard  Food Insecurity: Food Insecurity Present (07/01/2022)   Hunger Vital Sign    Worried About Running Out of Food in the Last Year: Sometimes true    Ran Out of Food in the Last Year: Sometimes true  Transportation Needs: Unmet Transportation Needs (07/01/2022)   PRAPARE - Administrator, Civil Service (Medical): Yes    Lack of Transportation (Non-Medical): Yes  Physical Activity:  Inactive (07/01/2022)   Exercise Vital Sign    Days of Exercise per Week: 0 days    Minutes of Exercise per Session: 20 min  Stress: Stress Concern Present (07/01/2022)   [employer] of Occupational Health - Occupational Stress Questionnaire    Feeling of Stress : To some extent  Social Connections: Moderately Integrated (07/01/2022)   Social Connection and Isolation Panel [NHANES]    Frequency of Communication with Friends and Family: More than three times a week    Frequency of Social Gatherings with Friends and Family: Once a week    Attends Religious Services: More than 4 times per year    Active Member of [employer] or Organizations: Yes    Attends Banker Meetings: 1 to 4 times per year    Marital Status: Never married  Recent Concern: Social Connections - Moderately Isolated (05/11/2022)   Social Connection and Isolation Panel [NHANES]    Frequency of Communication with Friends and Family: More than three times a week    Frequency of Social Gatherings with Friends and Family: Twice a week    Attends Religious Services: More than 4 times per year    Active Member of [employer] or Organizations: No    Attends Banker Meetings: Never    Marital Status: Never married    Allergies:   Allergies  Allergen Reactions   Shellfish Allergy Nausea Only    Metabolic Disorder Labs: Lab Results  Component Value Date   HGBA1C 6.0 (H) 12/24/2020   MPG 125.5 10/09/2020   No results found for: "PROLACTIN" Lab Results  Component Value Date   CHOL 153 12/24/2020   TRIG 126 12/24/2020   HDL 42 12/24/2020   CHOLHDL 3.6 12/24/2020   LDLCALC 88 12/24/2020   Lab Results  Component Value Date   TSH 2.290 12/24/2020   TSH 2.650 10/09/2020    Therapeutic Level Labs: No results found for: "LITHIUM" No results found for: "VALPROATE" No results found for: "CBMZ"  Current Medications: Current Outpatient Medications  Medication Sig Dispense Refill   traZODone (DESYREL) 50 MG tablet Take 1 tablet (50 mg total) by mouth at bedtime. 30 tablet 2   albuterol (VENTOLIN HFA) 108 (90 Base) MCG/ACT inhaler Inhale 2 puffs into the lungs every 6 (six) hours as needed for wheezing or shortness of breath. 8 g 3   cefdinir (OMNICEF) 300 MG capsule Take 1 capsule (300 mg total) by mouth 2 (two) times daily. (Patient not taking: Reported on 07/12/2022) 10 capsule 0   fluticasone (FLONASE) 50 MCG/ACT nasal spray Spray 2 sprays into both nostrils once daily. 16 g 2   meloxicam (MOBIC) 15 MG tablet Take 1 tablet (15 mg total) by mouth daily. 30 tablet 2   OXYGEN Inhale 2 L/min into the lungs at bedtime.     sertraline (ZOLOFT) 100 MG tablet Take 1.5 tablets (150 mg total) by mouth daily. 45 tablet 2   torsemide (DEMADEX) 20 MG tablet Take 3 tablets (60 mg total) by mouth 2 (two) times daily. 180 tablet 3   vitamin B-12 (CYANOCOBALAMIN) 100 MCG tablet Take 1 tablet (100 mcg total) by mouth once daily. (Patient not taking: Reported on 06/14/2022) 30 tablet 0   Current Facility-Administered Medications  Medication Dose Route Frequency Provider Last Rate Last Admin   cholecalciferol (VITAMIN D3) tablet 1,000 Units  1,000 Units Oral Daily Regalado, Belkys A, MD         Musculoskeletal: Strength &  Muscle Tone: within normal limits  Gait & Station: normal Patient leans: N/A  Psychiatric Specialty Exam: Review of Systems  Psychiatric/Behavioral:  Positive for sleep disturbance. Negative for decreased concentration, dysphoric mood, hallucinations, self-injury and suicidal ideas. The patient is nervous/anxious. The patient is not hyperactive.     Blood pressure 131/80, pulse 79, temperature 98.2 F (36.8 C), temperature source Oral, height 6\' 2"  (1.88 m), weight (!) 395 lb 9.6 oz (179.4 kg), SpO2 96 %.Body mass index is 50.79 kg/m.  General Appearance: Casual  Eye Contact:  Good  Speech:  Clear and Coherent and Normal Rate  Volume:  Normal  Mood:  Anxious and Depressed  Affect:  Appropriate  Thought Process:  Coherent, Goal Directed, and Descriptions of Associations: Intact  Orientation:  Full (Time, Place, and Person)  Thought Content: WDL   Suicidal Thoughts:  No  Homicidal Thoughts:  No  Memory:  Immediate;   Good Recent;   Good Remote;   Fair  Judgement:  Fair  Insight:  Fair  Psychomotor Activity:  Normal  Concentration:  Concentration: Good and Attention Span: Good  Recall:  Good  Fund of Knowledge: Fair  Language: Good  Akathisia:  No  Handed:  Left  AIMS (if indicated): not done  Assets:  Communication Skills Desire for Improvement Housing Social Support  ADL's:  Intact  Cognition: WNL  Sleep:   Patient has a history of sleep apnea. Patient receives on average 6 hours of sleep per night.   Screenings: AUDIT    Flowsheet Row Office Visit from 07/05/2022 in [facility] at [facility]  Alcohol Use Disorder Identification Test Final Score (AUDIT) 3      GAD-7    Flowsheet Row Clinical Support from 07/12/2022 in [facility] Counselor from 06/28/2022 in [facility] Counselor from 05/11/2022 in [facility] Clinical Support from 05/10/2022 in [facility] Office Visit from 05/06/2022 in [facility] Primary Care at [facility]  Total GAD-7 Score 8 10 16 16  0      PHQ2-9    Flowsheet Row Clinical Support from 07/12/2022 in [facility] Counselor from 06/28/2022 in [facility] Counselor from 05/11/2022 in [facility] Clinical Support from 05/10/2022 in [facility] Office Visit from 05/06/2022 in [facility] Primary Care at [facility]  PHQ-2 Total Score 1 1 3 3  0  PHQ-9 Total Score -- 4 10 10  0      Flowsheet Row Clinical Support from 07/12/2022 in [facility] ED to Hosp-Admission (Discharged) from 06/14/2022 in [facility] Counselor from 05/11/2022 in [facility]  C-SSRS RISK CATEGORY Low Risk No Risk Low Risk        Assessment and Plan:   Travarius Bacher. Mogg is a 47 year old, Caucasian male with a past psychiatric history significant for anxiety and major depressive disorder who presents to [facility] for follow-up and medication management.  Patient reports that he has been experiencing some drowsiness and is unsure if it is due to his sertraline use.  Despite experiencing drowsiness, patient continues to take sertraline regularly.  Patient endorses minimal depression at this time but continues to endorse anxiety related to stressors in his life such as housing and waiting to be approved for disability.  Patient reports that he was recently hospitalized due to having pneumonia.  During  his hospitalization, patient states that he was given trazodone to help with his sleep.  Patient is interested in being placed on trazodone due to not being able to receive consistent sleep at night.  In addition to taking sertraline, patient to be placed on trazodone 50 mg at bedtime for the  management of his sleep issues.  Patient was agreeable to recommendation.  Patient's medications to be e-prescribed to pharmacy of choice.  Provider allowed for time at the end of the encounter to discuss potential adverse side effects to patient's current medication regimen.  Patient vocalized understanding.  Collaboration of Care: Collaboration of Care: Medication Management AEB provider managing patient's psychiatric medications, Psychiatrist AEB patient being seen by mental health provider at this facility, and Referral or follow-up with counselor/therapist AEB patient being seen by a licensed clinical social worker at this facility  Patient/Guardian was advised Release of Information must be obtained prior to any record release in order to collaborate their care with an outside provider. Patient/Guardian was advised if they have not already done so to contact the registration department to sign all necessary forms in order for [place] to release information regarding their care.   Consent: Patient/Guardian gives verbal consent for treatment and assignment of benefits for services provided during this visit. Patient/Guardian expressed understanding and agreed to proceed.   1. GAD (generalized anxiety disorder)  - sertraline (ZOLOFT) 100 MG tablet; Take 1.5 tablets (150 mg total) by mouth daily.  Dispense: 45 tablet; Refill: 2  2. MDD (major depressive disorder), recurrent episode, moderate (HCC)  - sertraline (ZOLOFT) 100 MG tablet; Take 1.5 tablets (150 mg total) by mouth daily.  Dispense: 45 tablet; Refill: 2 - traZODone (DESYREL) 50 MG tablet; Take 1 tablet (50 mg total) by mouth at bedtime.  Dispense: 30 tablet; Refill: 2  Patient to follow-up in 2 months Provider spent a total of 19 minutes with the patient/reviewing patient's chart  Meta Hatchet, PA 07/12/2022, 1:47 PM

## 2022-07-12 NOTE — Progress Notes (Signed)
Established Patient Office Visit  Subjective    Patient ID: Christian [name], male    DOB: August 14, 1975  Age: 47 y.o. MRN: [id number]  CC:  Chief Complaint  Patient presents with   Foot Swelling    HPI Christian [name] presents to follow up on pneumonia. He also reports bilateral leg swelling with about 30 lbs weight gain since discharge. He denies SOB. He reports that he is in the shelter now but has not been following fluid restrictions. Patient also completed an additional 5 day course of antibiotics.    Outpatient Encounter Medications as of 07/12/2022  Medication Sig   albuterol (VENTOLIN HFA) 108 (90 Base) MCG/ACT inhaler Inhale 2 puffs into the lungs every 6 (six) hours as needed for wheezing or shortness of breath.   fluticasone (FLONASE) 50 MCG/ACT nasal spray Spray 2 sprays into both nostrils once daily.   meloxicam (MOBIC) 15 MG tablet Take 1 tablet (15 mg total) by mouth daily.   OXYGEN Inhale 2 L/min into the lungs at bedtime.   sertraline (ZOLOFT) 100 MG tablet Take 150 mg by mouth daily.   torsemide (DEMADEX) 20 MG tablet Take 1 tablet (20 mg total) by mouth 2 (two) times daily.   cefdinir (OMNICEF) 300 MG capsule Take 1 capsule (300 mg total) by mouth 2 (two) times daily. (Patient not taking: Reported on 07/12/2022)   vitamin B-12 (CYANOCOBALAMIN) 100 MCG tablet Take 1 tablet (100 mcg total) by mouth once daily. (Patient not taking: Reported on 06/14/2022)   Facility-Administered Encounter Medications as of 07/12/2022  Medication   cholecalciferol (VITAMIN D3) tablet 1,000 Units    Past Medical History:  Diagnosis Date   Allergic rhinitis    Anxiety    CHF (congestive heart failure) (HCC)    Depression    Depression    Elevated blood pressure reading without diagnosis of hypertension    History of chicken pox    Obesity    Pleural effusion    Sepsis (HCC)    Sleep apnea    O2 at night    Past Surgical History:  Procedure Laterality Date   COLON SURGERY      perforated bowel   TONSILLECTOMY AND ADENOIDECTOMY  03/01/1983    Family History  Problem Relation Age of Onset   Stroke Mother    Diabetes Mother        pre-diabetes   Heart disease Mother        CHF, pacer   Cancer Mother        lymphoma   Stomach cancer Mother    Clotting disorder Mother    Coronary artery disease Father 59       MI   Hypertension Father    Hyperlipidemia Father    Diabetes Father    Heart attack Father    Migraines Sister    Stroke Paternal Grandmother    Diabetes Paternal Grandmother    Heart disease Paternal Grandfather    Parkinson's disease Paternal Grandfather    Heart attack Paternal Grandfather     Social History   Socioeconomic History   Marital status: Single    Spouse name: Not on file   Number of children: 0   Years of education: Not on file   Highest education level: 12th grade  Occupational History   Not on file  Tobacco Use   Smoking status: Never   Smokeless tobacco: Never   Tobacco comments:    Living in homeless shelter  Vaping Use  Vaping Use: Never used  Substance and Sexual Activity   Alcohol use: Yes    Alcohol/week: 2.0 standard drinks of alcohol    Types: 2 Cans of beer per week    Comment: 1-2 beers 1-2 days a week   Drug use: Never   Sexual activity: Not Currently  Other Topics Concern   Not on file  Social History Narrative   Caffeine: occasional   Lives alone, near parents.     Occupation: works at [employer] part time   Edu: some college   Diet: some water, some fruits/vegetables, 1-2x/wk red meat, fish 1x/wk   Social Determinants of Health   Financial Resource Strain: High Risk (07/01/2022)   Overall Financial Resource Strain (CARDIA)    Difficulty of Paying Living Expenses: Very hard  Food Insecurity: Food Insecurity Present (07/01/2022)   Hunger Vital Sign    Worried About Running Out of Food in the Last Year: Sometimes true    Ran Out of Food in the Last Year: Sometimes true  Transportation  Needs: Unmet Transportation Needs (07/01/2022)   PRAPARE - Administrator, Civil Service (Medical): Yes    Lack of Transportation (Non-Medical): Yes  Physical Activity: Inactive (07/01/2022)   Exercise Vital Sign    Days of Exercise per Week: 0 days    Minutes of Exercise per Session: 20 min  Stress: Stress Concern Present (07/01/2022)   [employer] of Occupational Health - Occupational Stress Questionnaire    Feeling of Stress : To some extent  Social Connections: Moderately Integrated (07/01/2022)   Social Connection and Isolation Panel [NHANES]    Frequency of Communication with Friends and Family: More than three times a week    Frequency of Social Gatherings with Friends and Family: Once a week    Attends Religious Services: More than 4 times per year    Active Member of [employer] or Organizations: Yes    Attends Banker Meetings: 1 to 4 times per year    Marital Status: Never married  Recent Concern: Social Connections - Moderately Isolated (05/11/2022)   Social Connection and Isolation Panel [NHANES]    Frequency of Communication with Friends and Family: More than three times a week    Frequency of Social Gatherings with Friends and Family: Twice a week    Attends Religious Services: More than 4 times per year    Active Member of [employer] or Organizations: No    Attends Banker Meetings: Never    Marital Status: Never married  Intimate Partner Violence: Not At Risk (06/19/2022)   Humiliation, Afraid, Rape, and Kick questionnaire    Fear of Current or Ex-Partner: No    Emotionally Abused: No    Physically Abused: No    Sexually Abused: No    Review of Systems  Constitutional:  Negative for chills and fever.  Respiratory:  Positive for cough. Negative for shortness of breath.   Cardiovascular:  Positive for leg swelling.  All other systems reviewed and are negative.       Objective    BP 131/84   Pulse 79   Temp 97.6 F (36.4 C) (Oral)    Resp 16   Wt (!) 395 lb (179.2 kg)   SpO2 94%   BMI 50.71 kg/m   Physical Exam Vitals and nursing note reviewed.  Constitutional:      General: He is not in acute distress.    Appearance: He is obese.  Cardiovascular:  Rate and Rhythm: Normal rate and regular rhythm.  Pulmonary:     Effort: Pulmonary effort is normal. No respiratory distress.     Breath sounds: Normal breath sounds. No wheezing.  Abdominal:     Palpations: Abdomen is soft.     Tenderness: There is no abdominal tenderness.  Musculoskeletal:     Right lower leg: Edema present.     Left lower leg: Edema present.  Neurological:     General: No focal deficit present.     Mental Status: He is alert and oriented to person, place, and time.  Psychiatric:        Mood and Affect: Mood and affect normal.        Speech: Speech normal.        Behavior: Behavior normal. Behavior is cooperative.         Assessment & Plan:   1. Essential hypertension Appears stable. Continue   2. Chronic diastolic CHF (congestive heart failure) (HCC) Keep schedule appt with consultant for further eval/mgt  3. Edema, unspecified type Most likely 2/2 above. Willi increase torsemide from 20mg  to 60 mg BID    No follow-ups on file.   Tommie Raymond, MD

## 2022-07-12 NOTE — Telephone Encounter (Signed)
Jasmine December a nurse with [facility] called stating that the patient is wanting to move to Medicaid direct and will need supportive documentation proving his diagnosis. Her number is [phone] or the agency is [phone].

## 2022-07-12 NOTE — Telephone Encounter (Signed)
Recived secrue chat message from Lafonda Mosses RN at [facility] requesting call to  nurse named Jasmine December at [facility] [phone] . Return call was made. LCSW spoke with Jasmine December requesting [facility] for housing program with mental Health Dx. LCSW sent [facility] to [phone]

## 2022-07-12 NOTE — Progress Notes (Signed)
-  Patient c/o legs swelling ,and weight gain since been discharge from hospital - Patient has no other concerns

## 2022-07-13 ENCOUNTER — Encounter: Payer: Self-pay | Admitting: Physician Assistant

## 2022-07-13 ENCOUNTER — Other Ambulatory Visit: Payer: Self-pay

## 2022-07-13 NOTE — Progress Notes (Signed)
Pt seen by PW  Pt saw PCP and torsemide was increased to 60 mg bid.  He has not started that yet.   He feels he is breathing a little better.   Lungs are clearer, DOE is slightly improved. Still w/ LE edema.  However, wt is not much changed.   Wt Readings from Last 3 Encounters:  07/13/22 (!) 392 lb 6.4 oz (178 kg)  07/12/22 (!) 395 lb (179.2 kg)  07/05/22 (!) 393 lb (178.3 kg)       07/13/2022    3:44 PM 07/12/2022   11:25 AM 07/12/2022    8:47 AM  Vitals with BMI  Height     Weight 392 lbs 6 oz  395 lbs  BMI 50.36  50.69  Systolic 155  131  Diastolic 87  84  Pulse 70  79     Information is confidential and restricted. Go to Review Flowsheets to unlock data.    BP is elevated today, but should improve w/ diuresis.   He is compliant w/ appts and meds  Theodore Demark, PA-C 07/13/2022 3:52 PM

## 2022-07-14 ENCOUNTER — Other Ambulatory Visit: Payer: Self-pay

## 2022-07-18 ENCOUNTER — Other Ambulatory Visit: Payer: Self-pay

## 2022-07-19 ENCOUNTER — Other Ambulatory Visit: Payer: Self-pay

## 2022-07-20 ENCOUNTER — Encounter: Payer: Self-pay | Admitting: Physician Assistant

## 2022-07-20 NOTE — Progress Notes (Signed)
Pt seen by Dr Lowell Guitar.  He is here for a weight check.   He is compliant w/ higher dose Torsemide (60 mg which is 20 mg x 3) bid.  He is still coughing whitish sputum, got some improvement w/ Mucinex. He is about out of it, would like some more.  He has a lot of torsemide left, the pharmacy would not fill 3 tabs bid, but he still has a good amount left in the original bottle, prescribed 1 tab bid.   He was requested to follow up with his pharmacy to see if he can get the higher dose.  Wt Readings from Last 3 Encounters:  07/20/22 (!) 384 lb 3.2 oz (174.3 kg)  07/13/22 (!) 392 lb 6.4 oz (178 kg)  07/12/22 (!) 395 lb (179.2 kg)   He got shots in his R knee a month or 2 ago, not sure if the lidocaine patches he got last week helped.  He was given Voltaren gel.   Plantar fasciitis has not bothered him much recently, used to bother him when on his feet a lot.   Today's Vitals   07/20/22 1516  BP: 138/83  Pulse: 82  SpO2: 92%  Weight: (!) 384 lb 3.2 oz (174.3 kg)   Body mass index is 49.33 kg/m.  LE edema has improved, lungs w/ few rales bases.   W/ some deep breaths, O2 sats improved.   He is compliant w/ CPAP, not on O2.  Christian Demark, PA-C 07/20/2022 3:35 PM

## 2022-07-20 NOTE — Progress Notes (Signed)
47 yo with obesity, HFpEF, OSA, anxiety/depression following up.  He notes SOB is better, swelling is better.  Torsemide was increased Adiel Erney few weeks ago. Coughing the past few days in the morning.  No PND.  Breathing overall is better.  Exam RRR, CTAB, LE swelling (trace pitting - improved per discussion with other providers in room).    Continue torsemide, follow up with cards/PCP.  Needs repeat labs. Stop mobic until repeat creatinine, use voltaren for bilateral knee OA instead.

## 2022-07-21 ENCOUNTER — Other Ambulatory Visit: Payer: Self-pay

## 2022-07-27 ENCOUNTER — Ambulatory Visit (INDEPENDENT_AMBULATORY_CARE_PROVIDER_SITE_OTHER): Payer: Medicaid Other | Admitting: Licensed Clinical Social Worker

## 2022-07-27 DIAGNOSIS — F331 Major depressive disorder, recurrent, moderate: Secondary | ICD-10-CM

## 2022-07-27 DIAGNOSIS — F411 Generalized anxiety disorder: Secondary | ICD-10-CM

## 2022-07-27 NOTE — Progress Notes (Signed)
THERAPIST PROGRESS NOTE  Session Time: 30  Participation Level: Active  Behavioral Response: CasualAlertAnxious and Depressed  Type of Therapy: Individual Therapy  Treatment Goals addressed:  Active     Anxiety     LTG: Christian [name] will score less than 5 on the Generalized Anxiety Disorder 7 Scale (GAD-7)  (Progressing)     Start:  05/11/22    Expected End:  10/28/22         STG: Christian [name] will complete at least 80% of assigned homework  (Progressing)     Start:  05/11/22    Expected End:  10/28/22         STG: Christian [name] will practice problem solving skills 3 times per week for the next 4 weeks.      Start:  05/11/22    Expected End:  10/28/22         identify 3 trigger for anxiety  (Progressing)     Start:  05/11/22    Expected End:  10/28/22       Goal Note     Financials          create  3 coping skills for depression      Start:  05/11/22    Expected End:  10/28/22         Review results of GAD-7 with Christian [name] to track progress (Completed)     Start:  05/11/22    End:  06/28/22    Intervention Note     Reviewed with patient during session.            OP Depression     LTG: Reduce frequency, intensity, and duration of depression symptoms so that daily functioning is improved (Progressing)     Start:  05/11/22    Expected End:  10/28/22         LTG: Increase coping skills to manage depression and improve ability to perform daily activities (Progressing)     Start:  05/11/22    Expected End:  10/28/22         LTG: Christian [name] will score less than 10 on the Patient Health Questionnaire (PHQ-9)  (Progressing)     Start:  05/11/22    Expected End:  10/28/22               07/12/2022   11:25 AM 06/28/2022    2:39 PM 05/11/2022   10:19 AM 05/10/2022   11:42 AM 05/06/2022    9:23 AM  Depression screen PHQ 2/9  Decreased Interest 0 0 1 1 0  Down, Depressed, Hopeless 1 1 2 2  0  PHQ - 2 Score 1 1 3 3  0  Altered sleeping  1 1 1  0  Tired, decreased energy  1  1 1  0  Change in appetite  1 1 1  0  Feeling bad or failure about yourself   0 2 2 0  Trouble concentrating   1 1 0  Moving slowly or fidgety/restless  0 0 0 0  Suicidal thoughts  0 1 1 0  PHQ-9 Score  4 10 10  0  Difficult doing work/chores  Not difficult at all Somewhat difficult Somewhat difficult Not difficult at all       07/27/2022   11:25 AM 07/12/2022   11:26 AM 06/28/2022    2:41 PM 05/11/2022   10:20 AM  GAD 7 : Generalized Anxiety Score  Nervous, Anxious, on Edge 1 2 2 2   Control/stop worrying 1 1 1 3   Worry too much -  different things 1 2 1 3   Trouble relaxing 1 1 1 2   Restless 0 1 1 2   Easily annoyed or irritable 0 0 1 1  Afraid - awful might happen 0 1 3 3   Total GAD 7 Score 4 8 10 16   Anxiety Difficulty Not difficult at all Not difficult at all Not difficult at all Somewhat difficult      ProgressTowards Goals: Progressing  Interventions: CBT, Motivational Interviewing, and Supportive  Summary: Christian [name] is a 47 y.o. male who presents with depressed and anxious mood\affect.  Patient was pleasant, cooperative, maintained good eye contact.  He engaged well in therapy session and was dressed casually.  Patient reports primary stressors as financials, housing, and illness.  Patient reports that he is staying at [facility].  This is a homeless shelter in [place].  Patient reports that he does have a housing case manager through his Medicaid, however he reports that he does not have any income at this time and there are limited resources out there for him until he gains income.   Christian [name] reports that he has applied for SSI and SSDI.  Patient reports that he has a Clinical research associate assisting him with this process.  Patient reports that he will meet with his lawyer again this Friday, Jul 29, 2022.  Patient reports other stressors are poor illness.  Patient reports that he continues to follow-up with cardiology and pulmonology due to congestive heart  failure.  Suicidal/Homicidal: Nowithout intent/plan  Therapist Response:    Interventions/Plan: LCSW utilized psychoanalytic therapy for patient to express thoughts, feelings and emotions.  LCSW supportive therapy for praise and encouragement.  LCSW person centered therapy for empowerment and unconditional positive regard utilizing nonjudgmental stances.  LCSW spoke with patient about the benefits of taking medications to help decrease anxiety and depression.  LCSW spoke with patient about continued follow-up with the Social Security disability lawyers to help through the process of Social Security disability and gaining income.   Plan: Return again in 5 weeks.  Diagnosis: MDD (major depressive disorder), recurrent episode, moderate (HCC)  GAD (generalized anxiety disorder)  Collaboration of Care: Other None today   Patient/Guardian was advised Release of Information must be obtained prior to any record release in order to collaborate their care with an outside provider. Patient/Guardian was advised if they have not already done so to contact the registration department to sign all necessary forms in order for [place] to release information regarding their care.   Consent: Patient/Guardian gives verbal consent for treatment and assignment of benefits for services provided during this visit. Patient/Guardian expressed understanding and agreed to proceed.   Weber Cooks, LCSW 07/27/2022

## 2022-07-28 ENCOUNTER — Telehealth: Payer: Self-pay

## 2022-07-28 NOTE — Telephone Encounter (Signed)
Message received from [facility] stating that the patient does not meet the criteria for the TCL program

## 2022-08-01 ENCOUNTER — Other Ambulatory Visit: Payer: Self-pay

## 2022-08-01 ENCOUNTER — Encounter: Payer: Self-pay | Admitting: Cardiology

## 2022-08-01 ENCOUNTER — Ambulatory Visit: Payer: Medicaid Other | Attending: Cardiovascular Disease | Admitting: Cardiology

## 2022-08-01 VITALS — BP 128/84 | HR 89 | Ht 74.0 in | Wt 393.4 lb

## 2022-08-01 DIAGNOSIS — I5032 Chronic diastolic (congestive) heart failure: Secondary | ICD-10-CM | POA: Diagnosis present

## 2022-08-01 DIAGNOSIS — Z79899 Other long term (current) drug therapy: Secondary | ICD-10-CM | POA: Insufficient documentation

## 2022-08-01 DIAGNOSIS — I10 Essential (primary) hypertension: Secondary | ICD-10-CM | POA: Diagnosis not present

## 2022-08-01 LAB — BASIC METABOLIC PANEL
BUN/Creatinine Ratio: 16 (ref 9–20)
BUN: 18 mg/dL (ref 6–24)
CO2: 29 mmol/L (ref 20–29)
Calcium: 8.9 mg/dL (ref 8.7–10.2)
Chloride: 103 mmol/L (ref 96–106)
Creatinine, Ser: 1.1 mg/dL (ref 0.76–1.27)
Glucose: 104 mg/dL — ABNORMAL HIGH (ref 70–99)
Potassium: 3.7 mmol/L (ref 3.5–5.2)
Sodium: 145 mmol/L — ABNORMAL HIGH (ref 134–144)
eGFR: 83 mL/min/{1.73_m2} (ref >59)

## 2022-08-01 MED ORDER — TORSEMIDE 40 MG PO TABS: 40.0000 mg | ORAL_TABLET | Freq: Two times a day (BID) | ORAL | 3 refills | Status: DC

## 2022-08-01 MED ORDER — TORSEMIDE 40 MG PO TABS
40.0000 mg | ORAL_TABLET | Freq: Two times a day (BID) | ORAL | 3 refills | Status: DC
  Filled 2022-08-01: qty 180, 90d supply, fill #0

## 2022-08-01 NOTE — Patient Instructions (Signed)
Medication Instructions:  Please take Torsemide 40 mg one tablet twice a day.  Take the first tablet early morning and the second tablet 6 hours later. Continue all other medications as listed.   *If you need a refill on your cardiac medications before your next appointment, please call your pharmacy*   Lab Work: Please have blood work today (BMP)  If you have labs (blood work) drawn today and your tests are completely normal, you will receive your results only by: MyChart Message (if you have MyChart) OR A paper copy in the mail If you have any lab test that is abnormal or we need to change your treatment, we will call you to review the results.  Follow-Up: At [facility], you and your health needs are our priority.  As part of our continuing mission to provide you with exceptional heart care, we have created designated Provider Care Teams.  These Care Teams include your primary Cardiologist (physician) and Advanced Practice Providers (APPs -  Physician Assistants and Nurse Practitioners) who all work together to provide you with the care you need, when you need it.  We recommend signing up for the patient portal called "MyChart".  Sign up information is provided on this After Visit Summary.  MyChart is used to connect with patients for Virtual Visits (Telemedicine).  Patients are able to view lab/test results, encounter notes, upcoming appointments, etc.  Non-urgent messages can be sent to your provider as well.   To learn more about what you can do with MyChart, go to [email or web address].    Your next appointment:   4 month(s)  Provider:   Jari Favre, PA-C, Robin Searing, NP, Jacolyn Reedy, PA-C, Eligha Bridegroom, NP, or Tereso Newcomer, PA-C     Then, Dr Donato Schultz will plan to see you again in 1 year(s).

## 2022-08-01 NOTE — Progress Notes (Signed)
Cardiology Office Note:    Date:  08/01/2022   ID:  Christian [name], DOB 1975/06/03, MRN [id number]  PCP:  Christian Skeans, MD   [facility] Providers Cardiologist:  None     Referring MD: Christian Sicks, NP    History of Present Illness:    Christian [name] is a 47 y.o. male homeless, now in [place] house, still SOB, endurance low. Knee pain, saw ortho. Bone on bone. Shots. Voltaren.  Has trouble standing for long periods of time.  Has had trouble keeping a job because of this.  He is applying for disability.  Has an attorney.  During the day he does have to leave the shelter, usually goes to [employer] [facility].  Able to use the Wi-Fi there.  Recent PNA.   Mother had AFIB. Father had MI.   Occasionally will break out into a cold sweat.  Rare.  No syncope.  Sometimes has associated nausea.  May be vagal episode.  Past Medical History:  Diagnosis Date   Allergic rhinitis    Anxiety    CHF (congestive heart failure) (HCC)    Depression    Depression    Elevated blood pressure reading without diagnosis of hypertension    History of chicken pox    Obesity    Pleural effusion    Sepsis (HCC)    Sleep apnea    O2 at night    Past Surgical History:  Procedure Laterality Date   COLON SURGERY     perforated bowel   TONSILLECTOMY AND ADENOIDECTOMY  03/01/1983    Current Medications: Current Meds  Medication Sig   albuterol (VENTOLIN HFA) 108 (90 Base) MCG/ACT inhaler Inhale 2 puffs into the lungs every 6 (six) hours as needed for wheezing or shortness of breath.   cefdinir (OMNICEF) 300 MG capsule Take 1 capsule (300 mg total) by mouth 2 (two) times daily.   fluticasone (FLONASE) 50 MCG/ACT nasal spray Spray 2 sprays into both nostrils once daily.   meloxicam (MOBIC) 15 MG tablet Take 1 tablet (15 mg total) by mouth daily.   OXYGEN Inhale 2 L/min into the lungs at bedtime.   sertraline (ZOLOFT) 100 MG tablet Take 1.5 tablets (150 mg total) by mouth  daily.   traZODone (DESYREL) 50 MG tablet Take 1 tablet (50 mg total) by mouth at bedtime.   vitamin B-12 (CYANOCOBALAMIN) 100 MCG tablet Take 1 tablet (100 mcg total) by mouth once daily.   [DISCONTINUED] torsemide (DEMADEX) 20 MG tablet Take 3 tablets (60 mg total) by mouth 2 (two) times daily.   [DISCONTINUED] Torsemide 40 MG TABS Take 40 mg by mouth in the morning and at bedtime.   Current Facility-Administered Medications for the 08/01/22 encounter (Office Visit) with Jake Bathe, MD  Medication   cholecalciferol (VITAMIN D3) tablet 1,000 Units     Allergies:   Shellfish allergy   Social History   Socioeconomic History   Marital status: Single    Spouse name: Not on file   Number of children: 0   Years of education: Not on file   Highest education level: 12th grade  Occupational History   Not on file  Tobacco Use   Smoking status: Never   Smokeless tobacco: Never   Tobacco comments:    Living in homeless shelter  Vaping Use   Vaping Use: Never used  Substance and Sexual Activity   Alcohol use: Yes    Alcohol/week: 2.0 standard drinks of alcohol  Types: 2 Cans of beer per week    Comment: 1-2 beers 1-2 days a week   Drug use: Never   Sexual activity: Not Currently  Other Topics Concern   Not on file  Social History Narrative   Caffeine: occasional   Lives alone, near parents.     Occupation: works at [employer] part time   Edu: some college   Diet: some water, some fruits/vegetables, 1-2x/wk red meat, fish 1x/wk   Social Determinants of Health   Financial Resource Strain: High Risk (07/01/2022)   Overall Financial Resource Strain (CARDIA)    Difficulty of Paying Living Expenses: Very hard  Food Insecurity: Food Insecurity Present (07/01/2022)   Hunger Vital Sign    Worried About Running Out of Food in the Last Year: Sometimes true    Ran Out of Food in the Last Year: Sometimes true  Transportation Needs: Unmet Transportation Needs (07/01/2022)   PRAPARE -  Administrator, Civil Service (Medical): Yes    Lack of Transportation (Non-Medical): Yes  Physical Activity: Inactive (07/01/2022)   Exercise Vital Sign    Days of Exercise per Week: 0 days    Minutes of Exercise per Session: 20 min  Stress: Stress Concern Present (07/01/2022)   [employer] of Occupational Health - Occupational Stress Questionnaire    Feeling of Stress : To some extent  Social Connections: Moderately Integrated (07/01/2022)   Social Connection and Isolation Panel [NHANES]    Frequency of Communication with Friends and Family: More than three times a week    Frequency of Social Gatherings with Friends and Family: Once a week    Attends Religious Services: More than 4 times per year    Active Member of [employer] or Organizations: Yes    Attends Banker Meetings: 1 to 4 times per year    Marital Status: Never married  Recent Concern: Social Connections - Moderately Isolated (05/11/2022)   Social Connection and Isolation Panel [NHANES]    Frequency of Communication with Friends and Family: More than three times a week    Frequency of Social Gatherings with Friends and Family: Twice a week    Attends Religious Services: More than 4 times per year    Active Member of [employer] or Organizations: No    Attends Engineer, structural: Never    Marital Status: Never married     Family History: The patient's family history includes Cancer in his mother; Clotting disorder in his mother; Coronary artery disease (age of onset: 1) in his father; Diabetes in his father, mother, and paternal grandmother; Heart attack in his father and paternal grandfather; Heart disease in his mother and paternal grandfather; Hyperlipidemia in his father; Hypertension in his father; Migraines in his sister; Parkinson's disease in his paternal grandfather; Stomach cancer in his mother; Stroke in his mother and paternal grandmother.  ROS:   Please see the history of present  illness.     All other systems reviewed and are negative.  EKGs/Labs/Other Studies Reviewed:    The following studies were reviewed today: Cardiac Studies & Procedures       ECHOCARDIOGRAM  ECHOCARDIOGRAM COMPLETE 06/22/2022  Narrative ECHOCARDIOGRAM REPORT    Patient Name:   Christian [name] Date of Exam: 06/22/2022 Medical Rec #:  [id number]         Height:       74.0 in Accession #:    [id number]        Weight:  370.1 lb Date of Birth:  [birth year]         BSA:          2.823 m Patient Age:    47 years          BP:           130/70 mmHg Patient Gender: M                 HR:           61 bpm. Exam Location:  Inpatient  Procedure: 2D Echo, 3D Echo, Cardiac Doppler, Color Doppler and Intracardiac Opacification Agent  Indications:    R06.02 SOB  History:        Patient has prior history of Echocardiogram examinations, most recent 11/18/2020. CHF, Signs/Symptoms:Shortness of Breath, Dyspnea and Chest Pain; Risk Factors:Hypertension and Sleep Apnea.  Sonographer:    Sheralyn Boatman RDCS Referring Phys: [id number] Monica Martinez [facility]   Sonographer Comments: Technically difficult study due to poor echo windows, suboptimal parasternal window, suboptimal subcostal window and patient is obese. Image acquisition challenging due to patient body habitus. IMPRESSIONS   1. Left ventricular ejection fraction, by estimation, is 60 to 65%. Left ventricular ejection fraction by 3D volume is 61 %. The left ventricle has normal function. The left ventricle has no regional wall motion abnormalities. Left ventricular diastolic parameters are consistent with Grade I diastolic dysfunction (impaired relaxation). 2. Right ventricular systolic function is normal. The right ventricular size is mildly enlarged. Tricuspid regurgitation signal is inadequate for assessing PA pressure. 3. The mitral valve is normal in structure. No evidence of mitral valve regurgitation. No evidence of mitral stenosis. 4.  The aortic valve is normal in structure. Aortic valve regurgitation is not visualized. No aortic stenosis is present.  FINDINGS Left Ventricle: Left ventricular ejection fraction, by estimation, is 60 to 65%. Left ventricular ejection fraction by 3D volume is 61 %. The left ventricle has normal function. The left ventricle has no regional wall motion abnormalities. Definity contrast agent was given IV to delineate the left ventricular endocardial borders. The left ventricular internal cavity size was normal in size. There is no left ventricular hypertrophy. Left ventricular diastolic parameters are consistent with Grade I diastolic dysfunction (impaired relaxation). Normal left ventricular filling pressure.  Right Ventricle: The right ventricular size is mildly enlarged. No increase in right ventricular wall thickness. Right ventricular systolic function is normal. Tricuspid regurgitation signal is inadequate for assessing PA pressure.  Left Atrium: Left atrial size was normal in size.  Right Atrium: Right atrial size was normal in size.  Pericardium: There is no evidence of pericardial effusion.  Mitral Valve: The mitral valve is normal in structure. No evidence of mitral valve regurgitation. No evidence of mitral valve stenosis.  Tricuspid Valve: The tricuspid valve is normal in structure. Tricuspid valve regurgitation is not demonstrated. No evidence of tricuspid stenosis.  Aortic Valve: The aortic valve is normal in structure. Aortic valve regurgitation is not visualized. No aortic stenosis is present.  Pulmonic Valve: The pulmonic valve was normal in structure. Pulmonic valve regurgitation is not visualized. No evidence of pulmonic stenosis.  Aorta: The aortic root is normal in size and structure.  Venous: The inferior vena cava was not well visualized.  IAS/Shunts: No atrial level shunt detected by color flow Doppler.   LEFT VENTRICLE PLAX 2D LVIDd:         5.70 cm          Diastology LVIDs:  4.10 cm         LV e' medial:    7.62 cm/s LV PW:         1.20 cm         LV E/e' medial:  10.6 LV IVS:        1.10 cm         LV e' lateral:   8.22 cm/s LVOT diam:     2.50 cm         LV E/e' lateral: 9.8 LV SV:         125 LV SV Index:   44 LVOT Area:     4.91 cm        3D Volume EF LV 3D EF:    Left ventricul ar ejection fraction by 3D volume is 61 %.  3D Volume EF: 3D EF:        61 % LV EDV:       292 ml LV ESV:       114 ml LV SV:        179 ml  RIGHT VENTRICLE             IVC RV S prime:     16.60 cm/s  IVC diam: 2.30 cm TAPSE (M-mode): 3.0 cm  LEFT ATRIUM             Index        RIGHT ATRIUM           Index LA diam:        5.20 cm 1.84 cm/m   RA Area:     20.40 cm LA Vol (A2C):   66.4 ml 23.52 ml/m  RA Volume:   59.70 ml  21.14 ml/m LA Vol (A4C):   72.5 ml 25.68 ml/m LA Biplane Vol: 71.8 ml 25.43 ml/m AORTIC VALVE LVOT Vmax:   112.00 cm/s LVOT Vmean:  75.500 cm/s LVOT VTI:    0.254 m  AORTA Ao Root diam: 3.30 cm Ao Asc diam:  3.60 cm  MITRAL VALVE MV Area (PHT): 2.95 cm     SHUNTS MV Decel Time: 257 msec     Systemic VTI:  0.25 m MV E velocity: 80.60 cm/s   Systemic Diam: 2.50 cm MV A velocity: 105.00 cm/s MV E/A ratio:  0.77  Armanda Magic MD Electronically signed by Armanda Magic MD Signature Date/Time: 06/22/2022/4:27:57 PM    Final              EKG:  01/10/22: NSR  Recent Labs: 06/15/2022: B Natriuretic Peptide 39.5 06/22/2022: ALT 23 06/23/2022: Magnesium 2.4 06/25/2022: BUN 24; Creatinine, Ser 1.12; Hemoglobin 12.8; Platelets 325; Potassium 3.6; Sodium 138  Recent Lipid Panel    Component Value Date/Time   CHOL 153 12/24/2020 1227   TRIG 126 12/24/2020 1227   HDL 42 12/24/2020 1227   CHOLHDL 3.6 12/24/2020 1227   LDLCALC 88 12/24/2020 1227     Risk Assessment/Calculations:               Physical Exam:    VS:  BP 128/84   Pulse 89   Ht 6\' 2"  (1.88 m)   Wt (!) 393 lb 6.4 oz (178.4 kg)    SpO2 96%   BMI 50.51 kg/m     Wt Readings from Last 3 Encounters:  08/01/22 (!) 393 lb 6.4 oz (178.4 kg)  07/20/22 (!) 384 lb 3.2 oz (174.3 kg)  07/13/22 (!) 392 lb 6.4 oz (178 kg)  GEN:  Well nourished, well developed in no acute distress HEENT: Normal NECK: No JVD; No carotid bruits LYMPHATICS: No lymphadenopathy CARDIAC: RRR, no murmurs, rubs, gallops RESPIRATORY:  Clear to auscultation without rales, wheezing or rhonchi  ABDOMEN: Soft, non-tender, non-distended MUSCULOSKELETAL:  Trace edema; No deformity  SKIN: Warm and dry NEUROLOGIC:  Alert and oriented x 3 PSYCHIATRIC:  Normal affect   ASSESSMENT:    1. Chronic diastolic CHF (congestive heart failure) (HCC)   2. Essential hypertension   3. Medication management    PLAN:    In order of problems listed above:  Chronic diastolic heart failure - Continue with torsemide however we will change his dose to 40 mg twice a day for ease of use since there is a 40 mg tablet. -Check basic metabolic profile today to ensure proper renal function. -Continue to encourage exercise and weight loss.  Hypertension - Currently controlled.  Avoiding p.o. NSAIDs.  Knee pain - Has seen orthopedics, Voltaren gel.  Homelessness - Currently at [employer].  Currently applying for disability.          Medication Adjustments/Labs and Tests Ordered: Current medicines are reviewed at length with the patient today.  Concerns regarding medicines are outlined above.  Orders Placed This Encounter  Procedures   Basic metabolic panel   Meds ordered this encounter  Medications   DISCONTD: Torsemide 40 MG TABS    Sig: Take 40 mg by mouth in the morning and at bedtime.    Dispense:  180 tablet    Refill:  3    Dose change - Please discontinue any other prescription on file for Torsemide  Thank you   Torsemide 40 MG TABS    Sig: Take 1 tablet (40 mg) by mouth in the morning and at bedtime.    Dispense:  180 tablet    Refill:  3     Dose change - Please discontinue any other prescription on file for Torsemide  Thank you    Patient Instructions  Medication Instructions:  Please take Torsemide 40 mg one tablet twice a day.  Take the first tablet early morning and the second tablet 6 hours later. Continue all other medications as listed.   *If you need a refill on your cardiac medications before your next appointment, please call your pharmacy*   Lab Work: Please have blood work today (BMP)  If you have labs (blood work) drawn today and your tests are completely normal, you will receive your results only by: MyChart Message (if you have MyChart) OR A paper copy in the mail If you have any lab test that is abnormal or we need to change your treatment, we will call you to review the results.  Follow-Up: At [facility], you and your health needs are our priority.  As part of our continuing mission to provide you with exceptional heart care, we have created designated Provider Care Teams.  These Care Teams include your primary Cardiologist (physician) and Advanced Practice Providers (APPs -  Physician Assistants and Nurse Practitioners) who all work together to provide you with the care you need, when you need it.  We recommend signing up for the patient portal called "MyChart".  Sign up information is provided on this After Visit Summary.  MyChart is used to connect with patients for Virtual Visits (Telemedicine).  Patients are able to view lab/test results, encounter notes, upcoming appointments, etc.  Non-urgent messages can be sent to your provider as well.   To learn  more about what you can do with MyChart, go to [email or web address].    Your next appointment:   4 month(s)  Provider:   Jari Favre, PA-C, Robin Searing, NP, Jacolyn Reedy, PA-C, Eligha Bridegroom, NP, or Tereso Newcomer, PA-C     Then, Dr Donato Schultz will plan to see you again in 1 year(s).       Signed, Donato Schultz, MD  08/01/2022 10:07 AM     [facility]

## 2022-08-03 ENCOUNTER — Other Ambulatory Visit: Payer: Self-pay | Admitting: Family Medicine

## 2022-08-03 ENCOUNTER — Encounter: Payer: Self-pay | Admitting: Internal Medicine

## 2022-08-03 ENCOUNTER — Other Ambulatory Visit: Payer: Self-pay

## 2022-08-03 ENCOUNTER — Encounter: Payer: Self-pay | Admitting: *Deleted

## 2022-08-03 MED ORDER — TORSEMIDE 20 MG PO TABS
40.0000 mg | ORAL_TABLET | Freq: Two times a day (BID) | ORAL | 2 refills | Status: DC
  Filled 2022-08-03: qty 120, 30d supply, fill #0
  Filled 2022-08-20: qty 120, 30d supply, fill #1
  Filled 2022-08-23 – 2022-08-31 (×2): qty 120, 30d supply, fill #0
  Filled 2022-10-04: qty 120, 30d supply, fill #1

## 2022-08-03 NOTE — Congregational Nurse Program (Signed)
  Dept: [phone]   Congregational Nurse Program Note  Date of Encounter: 08/03/2022  Past Medical History: Past Medical History:  Diagnosis Date   Allergic rhinitis    Anxiety    CHF (congestive heart failure) (HCC)    Depression    Depression    Elevated blood pressure reading without diagnosis of hypertension    History of chicken pox    Obesity    Pleural effusion    Sepsis (HCC)    Sleep apnea    O2 at night    Encounter Details:  CNP Questionnaire - 08/03/22 1400       Questionnaire   Ask client: Do you give verbal consent for me to treat you today? Yes    Student Assistance N/A    Location Patient Served  [facility]    Visit Setting with Client Organization    Patient Status Unhoused    Insurance Medicaid    Insurance/Financial Assistance Referral N/A    Medication N/A    Medical Provider Yes    Screening Referrals Made N/A    Medical Referrals Made [facility] PCP/Clinic    Medical Appointment Made Other   [facility] clinic   Recently w/o PCP, now 1st time PCP visit completed due to CNs referral or appointment made N/A    Food N/A    Transportation N/A    Housing/Utilities No permanent housing    Interpersonal Safety N/A    Interventions Advocate/Support    Abnormal to Normal Screening Since Last CN Visit N/A    Screenings CN Performed Blood Pressure;Pulse Ox    Sent Client to Lab for: N/A    Did client attend any of the following based off CNs referral or appointments made? Medical    ED Visit Averted N/A    Life-Saving Intervention Made N/A            Client came to nurse's office requesting refill on his pain cream medication and asking about assistance with a walker containing a seat. Referred to [facility] clinic MD today. Completed Triage form, offered support and encouragement. Blood pressure 124/84, pulse 78, SpO2 93 %.  Ikechukwu Cerny W RN CN

## 2022-08-03 NOTE — Progress Notes (Signed)
47 year old Morbid Obesity, PMH,  HTN, Diastolic Heart failure presents to [facility] clinic with request for refill for Voltaren Gel for his chronic Knees pain. He was advised not to take Meloxican because it can affect his renal function. Voltaren gel helps with knee pain. He recently  saw his cardiologist and his torsemide was change to 40 mg Table BID. He couldn't get medications because his insurance wouldn't pay for 40 mg Tablets.   -We provided free sample for Voltaren gel.  -We also provided free sample for Tylenol for knee pain, to take 1 tablet every 8 hours as needed.  -We send a prescription to his pharmacy for Torsemide 20 mg table, for him to take 2 tablet BID.  -He was asking about walker with rest seat. He will be refer to his PCP and SW for assistance.

## 2022-08-04 ENCOUNTER — Other Ambulatory Visit: Payer: Medicaid Other

## 2022-08-04 ENCOUNTER — Other Ambulatory Visit: Payer: Self-pay

## 2022-08-04 ENCOUNTER — Encounter: Payer: Self-pay | Admitting: *Deleted

## 2022-08-04 NOTE — Congregational Nurse Program (Signed)
  Dept: [phone]   Congregational Nurse Program Note  Date of Encounter: 08/04/2022  Past Medical History: Past Medical History:  Diagnosis Date   Allergic rhinitis    Anxiety    CHF (congestive heart failure) (HCC)    Depression    Depression    Elevated blood pressure reading without diagnosis of hypertension    History of chicken pox    Obesity    Pleural effusion    Sepsis (HCC)    Sleep apnea    O2 at night    Encounter Details:  CNP Questionnaire - 08/04/22 1401       Questionnaire   Ask client: Do you give verbal consent for me to treat you today? N/A    Student Assistance N/A    Location Patient Served  [facility]    Visit Setting with Client Organization    Patient Status Unhoused    Insurance Medicaid    Insurance/Financial Assistance Referral N/A    Medication N/A    Medical Provider Yes    Screening Referrals Made N/A    Medical Referrals Made N/A    Medical Appointment Made N/A    Recently w/o PCP, now 1st time PCP visit completed due to CNs referral or appointment made N/A    Food N/A    Transportation N/A    Housing/Utilities No permanent housing    Interpersonal Safety N/A    Interventions Advocate/Support    Abnormal to Normal Screening Since Last CN Visit N/A    Screenings CN Performed N/A    Sent Client to Lab for: N/A    Did client attend any of the following based off CNs referral or appointments made? N/A    ED Visit Averted N/A    Life-Saving Intervention Made N/A            Picked up medications from [facility] pharmacy and brought back to [facility] for client. He is not at shelter this afternoon. Leaving medication with Cordelia Pen at front desk to give to client when he returns. Christian [name] W RNCN

## 2022-08-04 NOTE — Patient Outreach (Signed)
Bsw attempted a telephone outreach with patient, he was eating and asked for a call back later. BSW will contact patient later in the day.    Abelino Derrick, MHA [facility]  Managed [facility] Social Worker [phone]

## 2022-08-08 ENCOUNTER — Ambulatory Visit: Payer: Medicaid Other | Admitting: Adult Health

## 2022-08-17 ENCOUNTER — Ambulatory Visit (INDEPENDENT_AMBULATORY_CARE_PROVIDER_SITE_OTHER): Payer: Medicaid Other | Admitting: Family Medicine

## 2022-08-17 ENCOUNTER — Ambulatory Visit (INDEPENDENT_AMBULATORY_CARE_PROVIDER_SITE_OTHER): Payer: No Typology Code available for payment source | Admitting: Licensed Clinical Social Worker

## 2022-08-17 ENCOUNTER — Encounter: Payer: Self-pay | Admitting: Family Medicine

## 2022-08-17 VITALS — BP 119/79 | HR 72 | Temp 98.1°F | Resp 18 | Ht 74.0 in | Wt >= 6400 oz

## 2022-08-17 DIAGNOSIS — I5032 Chronic diastolic (congestive) heart failure: Secondary | ICD-10-CM

## 2022-08-17 DIAGNOSIS — F411 Generalized anxiety disorder: Secondary | ICD-10-CM

## 2022-08-17 DIAGNOSIS — F331 Major depressive disorder, recurrent, moderate: Secondary | ICD-10-CM

## 2022-08-17 DIAGNOSIS — I10 Essential (primary) hypertension: Secondary | ICD-10-CM | POA: Diagnosis not present

## 2022-08-17 DIAGNOSIS — Z5900 Homelessness unspecified: Secondary | ICD-10-CM

## 2022-08-17 NOTE — Progress Notes (Signed)
THERAPIST PROGRESS NOTE  Session Time: 30  Participation Level: Active  Behavioral Response: CasualAlertAnxious and Depressed  Type of Therapy: Individual Therapy  Treatment Goals addressed:  Active     Anxiety     LTG: Christian [name] will score less than 5 on the Generalized Anxiety Disorder 7 Scale (GAD-7)  (Completed/Met)     Start:  05/11/22    Expected End:  10/28/22    Resolved:  08/17/22    Goal Note     Scpre taken today and last session scored below a 5 each time          STG: [place] will complete at least 80% of assigned homework  (Progressing)     Start:  05/11/22    Expected End:  10/28/22         STG: Christian [name] will practice problem solving skills 3 times per week for the next 4 weeks.      Start:  05/11/22    Expected End:  10/28/22         identify 3 trigger for anxiety  (Progressing)     Start:  05/11/22    Expected End:  10/28/22       Goal Note     Financials          create  3 coping skills for depression      Start:  05/11/22    Expected End:  10/28/22         Review results of GAD-7 with Christian [name] to track progress (Completed)     Start:  05/11/22    End:  06/28/22    Intervention Note     Reviewed with patient during session.            OP Depression     LTG: Reduce frequency, intensity, and duration of depression symptoms so that daily functioning is improved (Progressing)     Start:  05/11/22    Expected End:  10/28/22         LTG: Increase coping skills to manage depression and improve ability to perform daily activities (Progressing)     Start:  05/11/22    Expected End:  10/28/22         LTG: Christian [name] will score less than 10 on the Patient Health Questionnaire (PHQ-9)  (Completed/Met)     Start:  05/11/22    Expected End:  10/28/22    Resolved:  08/17/22    Goal Note     Scpre taken today and last session scored below a 5 each time             ProgressTowards Goals: Progressing  Interventions: CBT, Motivational  Interviewing, and Supportive  Summary: Christian [name] is a 47 y.o. male who presents with depressed and anxious mood\affect.  Patient was pleasant, cooperative, maintained good eye contact.  [name] was alert and oriented x 5.  He was dressed casually engaged well in therapy session.  Patient reports primary stressors as housing and financials.  Patient reports that he is still in a homeless shelter for the past 60 days.  Patient reports that he has made it from them and they have been engaging in things such as Bible study and regular masses.  Patient reports that this has helped with his ideology and morals behind his faith.  Patient reports that he is still waiting on his income or Social Security disability to be approved or denied before he can start looking for housing.  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  Suicidal/Homicidal: Nowithout intent/plan  Therapist Response:     Intervention/Plan: LCSW psychoanalytic therapy for patient to express thoughts, feelings and emotions utilizing free association.  LCSW supportive therapy for praise and encouragement.  LCSW used person centered therapy for empowerment and unconditional positive regard utilizing nonjudgmental stance.  LCSW notes that his primary care physician administered a GAD-7 and PHQ-9 this morning and he scored below a 5 for the second time.  LCSW notes goals met for PHQ-9 and GAD-7.  LCSW educated patient on taking medications as prescribed and consistently.  Plan: Return again in 4 weeks.  Diagnosis: MDD (major depressive disorder), recurrent episode, moderate (HCC)  GAD (generalized anxiety disorder)  Collaboration of Care: Other None today 3  Patient/Guardian was advised Release of Information must be obtained prior to any record release in order to collaborate their care with an outside provider. Patient/Guardian was advised if they have not already done so to contact the registration  department to sign all necessary forms in order for [place] to release information regarding their care.   Consent: Patient/Guardian gives verbal consent for treatment and assignment of benefits for services provided during this visit. Patient/Guardian expressed understanding and agreed to proceed.   Weber Cooks, LCSW 08/17/2022

## 2022-08-17 NOTE — Progress Notes (Signed)
Patient is here for their 3 month follow-up Patient would like a walker Care gaps have been discussed with patient

## 2022-08-18 ENCOUNTER — Encounter: Payer: Self-pay | Admitting: *Deleted

## 2022-08-18 NOTE — Congregational Nurse Program (Signed)
  Dept: [phone]   Congregational Nurse Program Note  Date of Encounter: 08/18/2022  Past Medical History: Past Medical History:  Diagnosis Date   Allergic rhinitis    Anxiety    CHF (congestive heart failure) (HCC)    Depression    Depression    Elevated blood pressure reading without diagnosis of hypertension    History of chicken pox    Obesity    Pleural effusion    Sepsis (HCC)    Sleep apnea    O2 at night    Encounter Details:  CNP Questionnaire - 08/18/22 1236       Questionnaire   Ask client: Do you give verbal consent for me to treat you today? Yes    Student Assistance N/A    Location Patient Served  [facility]    Visit Setting with Client Organization    Patient Status Unhoused    Insurance Medicaid    Insurance/Financial Assistance Referral N/A    Medication N/A    Medical Provider Yes    Screening Referrals Made N/A    Medical Referrals Made N/A    Medical Appointment Made N/A    Recently w/o PCP, now 1st time PCP visit completed due to CNs referral or appointment made N/A    Food N/A    Transportation Need transportation assistance   client having medicaid changed to [place]   Housing/Utilities No permanent housing    Interpersonal Safety N/A    Interventions Advocate/Support    Abnormal to Normal Screening Since Last CN Visit N/A    Screenings CN Performed Blood Pressure;Weight;Pulse Ox    Sent Client to Lab for: N/A    Did client attend any of the following based off CNs referral or appointments made? N/A    ED Visit Averted N/A    Life-Saving Intervention Made N/A           Client came to nurse's office to p/u adult diapers he recently requested. Client given one pack of adult 2XL with more to be given as needed. Checked vitals. Blood pressure 131/84, pulse 77, weight (!) 406 lb 9.6 oz (184.4 kg), SpO2 95 %. Client reports he is working with medicaid to have a change to [employer]. He is wanting to start using medicaid transportation services as  he did while in [place]. He is waiting for the change to be updated and was told it could take up to 30 days. Client reports he recently joined a near by church and has a new friend he is attending bible study with. Offered support and encouragement. Khadim Lundberg W RN CN

## 2022-08-19 ENCOUNTER — Encounter: Payer: Self-pay | Admitting: Family Medicine

## 2022-08-19 NOTE — Progress Notes (Signed)
Established Patient Office Visit  Subjective    Patient ID: Christian [name], male    DOB: 07-13-1975  Age: 47 y.o. MRN: [id number]  CC:  Chief Complaint  Patient presents with   Follow-up    3 months    HPI Christian [name] presents for routine follow up of chronic med issues. He is currently homeless and is with a local shelter. He finds it hard during the day to do continuous walking as they are not allowed in the shelter during the day as well as his medical conditions. He would like a rollator to aid him.    Outpatient Encounter Medications as of 08/17/2022  Medication Sig   albuterol (VENTOLIN HFA) 108 (90 Base) MCG/ACT inhaler Inhale 2 puffs into the lungs every 6 (six) hours as needed for wheezing or shortness of breath.   fluticasone (FLONASE) 50 MCG/ACT nasal spray Spray 2 sprays into both nostrils once daily.   meloxicam (MOBIC) 15 MG tablet Take 1 tablet (15 mg total) by mouth daily.   OXYGEN Inhale 2 L/min into the lungs at bedtime.   sertraline (ZOLOFT) 100 MG tablet Take 1.5 tablets (150 mg total) by mouth daily.   torsemide (DEMADEX) 20 MG tablet Take 2 tablets (40 mg total) by mouth 2 (two) times daily.   traZODone (DESYREL) 50 MG tablet Take 1 tablet (50 mg total) by mouth at bedtime.   vitamin B-12 (CYANOCOBALAMIN) 100 MCG tablet Take 1 tablet (100 mcg total) by mouth once daily.   cefdinir (OMNICEF) 300 MG capsule Take 1 capsule (300 mg total) by mouth 2 (two) times daily.   Facility-Administered Encounter Medications as of 08/17/2022  Medication   cholecalciferol (VITAMIN D3) tablet 1,000 Units    Past Medical History:  Diagnosis Date   Allergic rhinitis    Anxiety    CHF (congestive heart failure) (HCC)    Depression    Depression    Elevated blood pressure reading without diagnosis of hypertension    History of chicken pox    Obesity    Pleural effusion    Sepsis (HCC)    Sleep apnea    O2 at night    Past Surgical History:  Procedure  Laterality Date   COLON SURGERY     perforated bowel   TONSILLECTOMY AND ADENOIDECTOMY  03/01/1983    Family History  Problem Relation Age of Onset   Stroke Mother    Diabetes Mother        pre-diabetes   Heart disease Mother        CHF, pacer   Cancer Mother        lymphoma   Stomach cancer Mother    Clotting disorder Mother    Coronary artery disease Father 58       MI   Hypertension Father    Hyperlipidemia Father    Diabetes Father    Heart attack Father    Migraines Sister    Stroke Paternal Grandmother    Diabetes Paternal Grandmother    Heart disease Paternal Grandfather    Parkinson's disease Paternal Grandfather    Heart attack Paternal Grandfather     Social History   Socioeconomic History   Marital status: Single    Spouse name: Not on file   Number of children: 0   Years of education: Not on file   Highest education level: 12th grade  Occupational History   Not on file  Tobacco Use   Smoking status: Never  Smokeless tobacco: Never   Tobacco comments:    Living in homeless shelter  Vaping Use   Vaping Use: Never used  Substance and Sexual Activity   Alcohol use: Yes    Alcohol/week: 2.0 standard drinks of alcohol    Types: 2 Cans of beer per week    Comment: 1-2 beers 1-2 days a week   Drug use: Never   Sexual activity: Not Currently  Other Topics Concern   Not on file  Social History Narrative   Caffeine: occasional   Lives alone, near parents.     Occupation: works at [employer] part time   Edu: some college   Diet: some water, some fruits/vegetables, 1-2x/wk red meat, fish 1x/wk   Social Determinants of Health   Financial Resource Strain: High Risk (07/01/2022)   Overall Financial Resource Strain (CARDIA)    Difficulty of Paying Living Expenses: Very hard  Food Insecurity: Food Insecurity Present (07/01/2022)   Hunger Vital Sign    Worried About Running Out of Food in the Last Year: Sometimes true    Ran Out of Food in the Last  Year: Sometimes true  Transportation Needs: Unmet Transportation Needs (07/01/2022)   PRAPARE - Administrator, Civil Service (Medical): Yes    Lack of Transportation (Non-Medical): Yes  Physical Activity: Inactive (07/01/2022)   Exercise Vital Sign    Days of Exercise per Week: 0 days    Minutes of Exercise per Session: 20 min  Stress: Stress Concern Present (07/01/2022)   [employer] of Occupational Health - Occupational Stress Questionnaire    Feeling of Stress : To some extent  Social Connections: Moderately Integrated (07/01/2022)   Social Connection and Isolation Panel [NHANES]    Frequency of Communication with Friends and Family: More than three times a week    Frequency of Social Gatherings with Friends and Family: Once a week    Attends Religious Services: More than 4 times per year    Active Member of [employer] or Organizations: Yes    Attends Banker Meetings: 1 to 4 times per year    Marital Status: Never married  Recent Concern: Social Connections - Moderately Isolated (05/11/2022)   Social Connection and Isolation Panel [NHANES]    Frequency of Communication with Friends and Family: More than three times a week    Frequency of Social Gatherings with Friends and Family: Twice a week    Attends Religious Services: More than 4 times per year    Active Member of [employer] or Organizations: No    Attends Banker Meetings: Never    Marital Status: Never married  Intimate Partner Violence: Not At Risk (06/19/2022)   Humiliation, Afraid, Rape, and Kick questionnaire    Fear of Current or Ex-Partner: No    Emotionally Abused: No    Physically Abused: No    Sexually Abused: No    Review of Systems  All other systems reviewed and are negative.       Objective    BP 119/79   Pulse 72   Temp 98.1 F (36.7 C) (Oral)   Resp 18   Ht 6\' 2"  (1.88 m)   Wt (!) 400 lb (181.4 kg)   SpO2 92%   BMI 51.36 kg/m   Physical Exam Vitals and nursing  note reviewed.  Constitutional:      General: He is not in acute distress.    Appearance: He is obese.  Cardiovascular:     Rate  and Rhythm: Normal rate and regular rhythm.  Pulmonary:     Effort: Pulmonary effort is normal. No respiratory distress.     Breath sounds: Normal breath sounds. No wheezing.  Abdominal:     Palpations: Abdomen is soft.     Tenderness: There is no abdominal tenderness.  Musculoskeletal:     Right lower leg: No edema.     Left lower leg: No edema.  Neurological:     General: No focal deficit present.     Mental Status: He is alert and oriented to person, place, and time.  Psychiatric:        Mood and Affect: Mood and affect normal.        Speech: Speech normal.        Behavior: Behavior normal. Behavior is cooperative.          Assessment & Plan:   1. Essential hypertension Appears stable. continue  2. Homeless Living at local shelter  3. Chronic diastolic CHF (congestive heart failure) (HCC) Appears stable. Script for rollator to aid him in ambulating daily    No follow-ups on file.   Tommie Raymond, MD

## 2022-08-20 ENCOUNTER — Other Ambulatory Visit (HOSPITAL_COMMUNITY): Payer: Self-pay

## 2022-08-22 ENCOUNTER — Other Ambulatory Visit: Payer: Self-pay

## 2022-08-23 ENCOUNTER — Other Ambulatory Visit (HOSPITAL_COMMUNITY): Payer: Self-pay

## 2022-08-29 ENCOUNTER — Ambulatory Visit: Payer: MEDICAID | Admitting: Adult Health

## 2022-08-29 ENCOUNTER — Ambulatory Visit: Payer: Medicaid Other | Admitting: Adult Health

## 2022-08-29 ENCOUNTER — Encounter: Payer: Self-pay | Admitting: *Deleted

## 2022-08-29 DIAGNOSIS — R0609 Other forms of dyspnea: Secondary | ICD-10-CM | POA: Diagnosis not present

## 2022-08-29 LAB — PULMONARY FUNCTION TEST
DL/VA % pred: 139 %
DL/VA: 6.18 ml/min/mmHg/L
DLCO cor % pred: 134 %
DLCO cor: 42.59 ml/min/mmHg
DLCO unc % pred: 130 %
DLCO unc: 41.07 ml/min/mmHg
FEF 25-75 Post: 2.41 L/sec
FEF 25-75 Pre: 2.01 L/sec
FEF2575-%Change-Post: 20 %
FEF2575-%Pred-Post: 64 %
FEF2575-%Pred-Pre: 53 %
FEV1-%Change-Post: 10 %
FEV1-%Pred-Post: 72 %
FEV1-%Pred-Pre: 65 %
FEV1-Post: 3.09 L
FEV1-Pre: 2.8 L
FEV1FVC-%Change-Post: 7 %
FEV1FVC-%Pred-Pre: 86 %
FEV6-%Change-Post: 3 %
FEV6-%Pred-Post: 79 %
FEV6-%Pred-Pre: 77 %
FEV6-Post: 4.19 L
FEV6-Pre: 4.07 L
FEV6FVC-%Change-Post: 0 %
FEV6FVC-%Pred-Post: 102 %
FEV6FVC-%Pred-Pre: 101 %
FVC-%Change-Post: 2 %
FVC-%Pred-Post: 77 %
FVC-%Pred-Pre: 75 %
FVC-Post: 4.25 L
FVC-Pre: 4.13 L
Post FEV1/FVC ratio: 73 %
Post FEV6/FVC ratio: 99 %
Pre FEV1/FVC ratio: 68 %
Pre FEV6/FVC Ratio: 98 %
RV % pred: 180 %
RV: 3.79 L
TLC % pred: 109 %
TLC: 8.04 L

## 2022-08-29 NOTE — Patient Instructions (Signed)
Full PFT performed today. °

## 2022-08-29 NOTE — Congregational Nurse Program (Signed)
  Dept: [phone]   Congregational Nurse Program Note  Date of Encounter: 08/29/2022  Past Medical History: Past Medical History:  Diagnosis Date   Allergic rhinitis    Anxiety    CHF (congestive heart failure) (HCC)    Depression    Depression    Elevated blood pressure reading without diagnosis of hypertension    History of chicken pox    Obesity    Pleural effusion    Sepsis (HCC)    Sleep apnea    O2 at night    Encounter Details:  CNP Questionnaire - 08/29/22 0981       Questionnaire   Ask client: Do you give verbal consent for me to treat you today? Yes    Student Assistance N/A    Location Patient Served  [facility]    Visit Setting with Client Organization    Patient Status Unhoused    Insurance Medicaid    Insurance/Financial Assistance Referral N/A    Medication N/A    Medical Provider Yes    Screening Referrals Made N/A    Medical Referrals Made N/A    Medical Appointment Made N/A    Food N/A    Transportation Provided transportation assistance;Need transportation assistance    Housing/Utilities No permanent housing    Interpersonal Safety N/A    Interventions Advocate/Support;Spiritual Care    Abnormal to Normal Screening Since Last CN Visit N/A    Screenings CN Performed Blood Pressure;Pulse Ox    Sent Client to Lab for: N/A    Did client attend any of the following based off CNs referral or appointments made? N/A    ED Visit Averted N/A    Life-Saving Intervention Made N/A            Client came to nurse's office requesting assistance with transportation to an appt today with pulmonology. Checked vitals. Blood pressure 139/86, pulse 73, SpO2 95 %. Offered listening support, and spiritual care as client discussed stressors related to job, housing etc. Gave bus pass to assist with transportation. Lanya Bucks W RN CN

## 2022-08-29 NOTE — Progress Notes (Signed)
Full PFT performed today. °

## 2022-08-31 ENCOUNTER — Ambulatory Visit (INDEPENDENT_AMBULATORY_CARE_PROVIDER_SITE_OTHER): Payer: MEDICAID

## 2022-08-31 ENCOUNTER — Encounter: Payer: Self-pay | Admitting: Adult Health

## 2022-08-31 ENCOUNTER — Other Ambulatory Visit (HOSPITAL_COMMUNITY): Payer: Self-pay

## 2022-08-31 ENCOUNTER — Ambulatory Visit: Payer: MEDICAID | Admitting: Adult Health

## 2022-08-31 VITALS — BP 112/70 | HR 69 | Ht 74.0 in | Wt >= 6400 oz

## 2022-08-31 DIAGNOSIS — G4733 Obstructive sleep apnea (adult) (pediatric): Secondary | ICD-10-CM | POA: Diagnosis not present

## 2022-08-31 DIAGNOSIS — I5032 Chronic diastolic (congestive) heart failure: Secondary | ICD-10-CM

## 2022-08-31 DIAGNOSIS — J45909 Unspecified asthma, uncomplicated: Secondary | ICD-10-CM | POA: Insufficient documentation

## 2022-08-31 DIAGNOSIS — J4530 Mild persistent asthma, uncomplicated: Secondary | ICD-10-CM

## 2022-08-31 DIAGNOSIS — J189 Pneumonia, unspecified organism: Secondary | ICD-10-CM

## 2022-08-31 DIAGNOSIS — G4734 Idiopathic sleep related nonobstructive alveolar hypoventilation: Secondary | ICD-10-CM | POA: Diagnosis not present

## 2022-08-31 MED ORDER — FLUTICASONE-SALMETEROL 115-21 MCG/ACT IN AERO
2.0000 | INHALATION_SPRAY | Freq: Two times a day (BID) | RESPIRATORY_TRACT | 12 refills | Status: DC
  Filled 2022-08-31 – 2022-09-06 (×2): qty 12, 30d supply, fill #0
  Filled 2022-10-04: qty 12, 30d supply, fill #1
  Filled 2022-11-03: qty 12, 30d supply, fill #2
  Filled 2022-12-12: qty 12, 30d supply, fill #3
  Filled 2023-01-11 – 2023-01-13 (×2): qty 12, 30d supply, fill #4
  Filled 2023-02-11: qty 12, 30d supply, fill #5
  Filled 2023-04-12: qty 12, 30d supply, fill #6
  Filled 2023-05-16: qty 12, 30d supply, fill #7
  Filled 2023-06-13: qty 12, 30d supply, fill #8
  Filled 2023-07-11: qty 12, 30d supply, fill #9
  Filled 2023-08-10: qty 12, 30d supply, fill #10

## 2022-08-31 NOTE — Assessment & Plan Note (Signed)
DemadexOxygen is currently on hold at bedtime with CPAP due to housing instability.  Continue on CPAP at bedtime.  Restart oxygen with CPAP once housing permits oxygen usage.  Plan  Patient Instructions  Keep up the good work Continue on CPAP at bedtime with oxygen. Work on healthy weight loss Do not drive if sleepy Activity as tolerated.  CT chest in  March 2025 to follow lung nodules  Begin Advair 115 2 puff Twice daily, rinse after use   Albuterol inhaler 1-2 puffs every 4-6hr as needed .  Chest xray today  Follow up with in 4 months with Dr. Wynona Neat or Vesper Trant NP and As needed

## 2022-08-31 NOTE — Progress Notes (Signed)
Patient seen in the office today and instructed on use of Advair.  Patient expressed understanding and demonstrated technique. 

## 2022-08-31 NOTE — Assessment & Plan Note (Signed)
Recently admitted in April for pneumonia.  Clinically improved.  Check chest x-ray today.

## 2022-08-31 NOTE — Assessment & Plan Note (Signed)
Appears euvolemic on exam.  No evidence of acute volume overload.  Continue current management and follow-up with cardiology

## 2022-08-31 NOTE — Assessment & Plan Note (Signed)
Control and compliance on CPAP.  Patient is unable to use oxygen with his CPAP.  Once housing changes hopefully he can restart oxygen with his CPAP for now continue on CPAP at bedtime.  Plan  Patient Instructions  Keep up the good work Continue on CPAP at bedtime with oxygen. Work on healthy weight loss Do not drive if sleepy Activity as tolerated.  CT chest in  March 2025 to follow lung nodules  Begin Advair 115 2 puff Twice daily, rinse after use   Albuterol inhaler 1-2 puffs every 4-6hr as needed .  Chest xray today  Follow up with in 4 months with Dr. Wynona Neat or Tenaya Hilyer NP and As needed

## 2022-08-31 NOTE — Progress Notes (Signed)
@Patient  ID: Christian [name], male    DOB: [birth year], 47 y.o.   MRN: [id number]  Chief Complaint  Patient presents with   Follow-up    Referring provider: [name], MD  HPI: 47 year old male never smoker seen for sleep consult March 18, 2022 to establish for moderate obstructive sleep apnea and chronic respiratory failure on nocturnal oxygen.  Also complained of shortness of breath Medical history significant for diastolic heart failure, recurrent hospitalization in 2022 with a perforated bowel status post resection, acute respiratory failure, influenza, sepsis, decubitus ulcer discharged on oxygen felt to have underlying OSA and OHS, morbid obesity with BMI at 47   TEST/EVENTS :  NPSG June 14, 2021 showed moderate sleep apnea with AHI at 25.2/hour, AHI during REM was 82/hour, SpO2 low 71%  CPAP titration study March 22, 2022 shows optimal control on CPAP Rec: Trial of CPAP therapy on 12 cm H2O with O2 2l/m  - Patient used a Large size [employer] Full Face Evora Full mask and heated humidification. - Supplemental O2 2L. Adjust as appropriate.  08/31/2022 Follow up ; OSA , Dyspnea, Nocturnal O2 , Pneumonia  Patient presents for 55-month follow-up.  Patient has known underlying sleep apnea and OHS.  He is on nocturnal CPAP with oxygen.  Patient says he wears his CPAP every single night.  He has housing instability.  Is currently and a homeless shelter and to leave her house.  He is able to use his CPAP machine but is unable to use oxygen with it.  He has his oxygen concentrator in storage.  He is currently working on disability to be able to obtain different housing when available.  Patient says his CPAP helps with decreased daytime sleepiness.  Feels like he benefits from CPAP.  CPAP download shows excellent compliance with daily average usage at 8 hours, patient is on CPAP 12 cm H2O.  AHI 1.5/hour.  Minimum leaks.  He is currently using a fullface mask.  Patient has a history  of recurrent pneumonia.  He has severe pneumonia with prolonged hospitalization in 2022.  Had COVID-19 infection in November 2023.  CT chest April 29, 2022 shows significant improvement with resolution of pulmonary infiltrates.  There was some residual clusters of groundglass and scattered nodules measuring up to 4 mm.  He has a planned serial CT in March 2025.  Patient is a never smoker.  He was recently admitted in April 2024 for left lower lobe pneumonia.  He was treated with empiric antibiotics.  Patient says he is improved with decreased cough and congestion.  During admission echo showed preserved EF.  Grade 1 diastolic dysfunction.  CT chest showed infiltrate in the left lower lobe, lingula and right middle lobe.  Scarring in the right upper and right middle lobe.  Patient was set up for PFTs that were done on August 29, 2022 that showed mild to moderate airflow obstruction and mild restriction.  FEV1 65%, ratio 68, FVC 75%, 10% bronchodilator change with some reversibility.  DLCO 130%.  We discussed his pulmonary function testing in detail.  Patient says he does get some shortness of breath with activities.  Uses Albuterol inhaler couple times a week. Patient is sedentary does not exercise on a regular basis.  BMI is 52 with current weight of 407 pounds.  Patient is following with cardiology for diastolic heart failure.  Is on Demadex 40 mg twice daily.  Says lower extremity edema is some better.  Allergies  Allergen Reactions   Shellfish  Allergy Nausea Only    Immunization History  Administered Date(s) Administered   Influenza Split 11/30/2011   Influenza,inj,Quad PF,6+ Mos 11/07/2020    Past Medical History:  Diagnosis Date   Allergic rhinitis    Anxiety    CHF (congestive heart failure) (HCC)    Depression    Depression    Elevated blood pressure reading without diagnosis of hypertension    History of chicken pox    Obesity    Pleural effusion    Sepsis (HCC)    Sleep apnea    O2  at night    Tobacco History: Social History   Tobacco Use  Smoking Status Never  Smokeless Tobacco Never  Tobacco Comments   Living in homeless shelter   Counseling given: Not Answered Tobacco comments: Living in homeless shelter   Outpatient Medications Prior to Visit  Medication Sig Dispense Refill   albuterol (VENTOLIN HFA) 108 (90 Base) MCG/ACT inhaler Inhale 2 puffs into the lungs every 6 (six) hours as needed for wheezing or shortness of breath. 8 g 3   fluticasone (FLONASE) 50 MCG/ACT nasal spray Spray 2 sprays into both nostrils once daily. 16 g 2   meloxicam (MOBIC) 15 MG tablet Take 1 tablet (15 mg total) by mouth daily. 30 tablet 2   OXYGEN Inhale 2 L/min into the lungs at bedtime.     sertraline (ZOLOFT) 100 MG tablet Take 1.5 tablets (150 mg total) by mouth daily. 45 tablet 2   torsemide (DEMADEX) 20 MG tablet Take 2 tablets (40 mg total) by mouth 2 (two) times daily. 120 tablet 2   traZODone (DESYREL) 50 MG tablet Take 1 tablet (50 mg total) by mouth at bedtime. 30 tablet 2   vitamin B-12 (CYANOCOBALAMIN) 100 MCG tablet Take 1 tablet (100 mcg total) by mouth once daily. 30 tablet 0   Facility-Administered Medications Prior to Visit  Medication Dose Route Frequency Provider Last Rate Last Admin   cholecalciferol (VITAMIN D3) tablet 1,000 Units  1,000 Units Oral Daily Regalado, Belkys A, MD         Review of Systems:   Constitutional:   No  weight loss, night sweats,  Fevers, chills,  +fatigue, or  lassitude.  HEENT:   No headaches,  Difficulty swallowing,  Tooth/dental problems, or  Sore throat,                No sneezing, itching, ear ache, nasal congestion, post nasal drip,   CV:  No chest pain,  Orthopnea, PND, swelling in lower extremities, anasarca, dizziness, palpitations, syncope.   GI  No heartburn, indigestion, abdominal pain, nausea, vomiting, diarrhea, change in bowel habits, loss of appetite, bloody stools.   Resp:   No chest wall  deformity  Skin: no rash or lesions.  GU: no dysuria, change in color of urine, no urgency or frequency.  No flank pain, no hematuria   MS:  No joint pain or swelling.  No decreased [name] of motion.  No back pain.    Physical Exam  BP 112/70 (BP Location: Left Wrist, Patient Position: Sitting, Cuff Size: Large)   Pulse 69   Ht 6\' 2"  (1.88 m)   Wt (!) 407 lb 3.2 oz (184.7 kg)   SpO2 97%   BMI 52.28 kg/m   GEN: A/Ox3; pleasant , NAD, well nourished    HEENT:  [place]/AT,   NOSE-clear, THROAT-clear, no lesions, no postnasal drip or exudate noted.  Class III-IV MP airway  NECK:  Supple w/ fair  ROM; no JVD; normal carotid impulses w/o bruits; no thyromegaly or nodules palpated; no lymphadenopathy.    RESP  Clear  P & A; w/o, wheezes/ rales/ or rhonchi. no accessory muscle use, no dullness to percussion  CARD:  RRR, no m/r/g, 1+ peripheral edema, pulses intact, no cyanosis or clubbing.  GI:   Soft & nt; nml bowel sounds; no organomegaly or masses detected.   Musco: Warm bil, no deformities or joint swelling noted.   Neuro: alert, no focal deficits noted.    Skin: Warm, no lesions or rashes    Lab Results:  CBC    Component Value Date/Time   WBC 9.2 06/25/2022 0703   RBC 4.80 06/25/2022 0703   HGB 12.8 (L) 06/25/2022 0703   HGB 14.4 03/26/2021 0903   HCT 40.8 06/25/2022 0703   HCT 44.1 03/26/2021 0903   PLT 325 06/25/2022 0703   PLT 354 03/26/2021 0903   MCV 85.0 06/25/2022 0703   MCV 78 (L) 03/26/2021 0903   MCH 26.7 06/25/2022 0703   MCHC 31.4 06/25/2022 0703   RDW 14.2 06/25/2022 0703   RDW 14.7 03/26/2021 0903   LYMPHSABS 2.2 06/25/2022 0703   LYMPHSABS 1.7 03/26/2021 0903   MONOABS 0.8 06/25/2022 0703   EOSABS 0.3 06/25/2022 0703   EOSABS 0.4 03/26/2021 0903   BASOSABS 0.1 06/25/2022 0703   BASOSABS 0.1 03/26/2021 0903    BMET    Component Value Date/Time   NA 145 (H) 08/01/2022 0905   K 3.7 08/01/2022 0905   CL 103 08/01/2022 0905   CO2 29  08/01/2022 0905   GLUCOSE 104 (H) 08/01/2022 0905   GLUCOSE 95 06/25/2022 0703   BUN 18 08/01/2022 0905   CREATININE 1.10 08/01/2022 0905   CALCIUM 8.9 08/01/2022 0905   GFRNONAA >60 06/25/2022 0703   GFRAA >60 09/23/2019 1700    BNP    Component Value Date/Time   BNP 39.5 06/15/2022 0518    ProBNP No results found for: "PROBNP"  Imaging: DG Chest 2 View  Result Date: 08/31/2022 CLINICAL DATA:  Follow-up pneumonia EXAM: CHEST - 2 VIEW COMPARISON:  06/14/2022 FINDINGS: Heart size is normal. Mediastinal shadows are normal. Hazy infiltrates seen previously by CT are not visible by chest radiography. No evidence of pneumonia by radiography. No effusion. No acute bone finding. IMPRESSION: No active disease by radiography. Hazy infiltrates seen previously by CT are not visible by radiography. Electronically Signed   By: Paulina Fusi M.D.   On: 08/31/2022 11:03         Latest Ref Rng & Units 08/29/2022    2:48 PM  PFT Results  FVC-Pre L 4.13  P  FVC-Predicted Pre % 75  P  FVC-Post L 4.25  P  FVC-Predicted Post % 77  P  Pre FEV1/FVC % % 68  P  Post FEV1/FCV % % 73  P  FEV1-Pre L 2.80  P  FEV1-Predicted Pre % 65  P  FEV1-Post L 3.09  P  DLCO uncorrected ml/min/mmHg 41.07  P  DLCO UNC% % 130  P  DLCO corrected ml/min/mmHg 42.59  P  DLCO COR %Predicted % 134  P  DLVA Predicted % 139  P  TLC L 8.04  P  TLC % Predicted % 109  P  RV % Predicted % 180  P    P Preliminary result    No results found for: "NITRICOXIDE"      Assessment & Plan:   OSA (obstructive sleep apnea) Control and compliance  on CPAP.  Patient is unable to use oxygen with his CPAP.  Once housing changes hopefully he can restart oxygen with his CPAP for now continue on CPAP at bedtime.  Plan  Patient Instructions  Keep up the good work Continue on CPAP at bedtime with oxygen. Work on healthy weight loss Do not drive if sleepy Activity as tolerated.  CT chest in  March 2025 to follow lung nodules   Begin Advair 115 2 puff Twice daily, rinse after use   Albuterol inhaler 1-2 puffs every 4-6hr as needed .  Chest xray today  Follow up with in 4 months with Dr. Wynona Neat or Slate Debroux NP and As needed       Nocturnal hypoxia DemadexOxygen is currently on hold at bedtime with CPAP due to housing instability.  Continue on CPAP at bedtime.  Restart oxygen with CPAP once housing permits oxygen usage.  Plan  Patient Instructions  Keep up the good work Continue on CPAP at bedtime with oxygen. Work on healthy weight loss Do not drive if sleepy Activity as tolerated.  CT chest in  March 2025 to follow lung nodules  Begin Advair 115 2 puff Twice daily, rinse after use   Albuterol inhaler 1-2 puffs every 4-6hr as needed .  Chest xray today  Follow up with in 4 months with Dr. Wynona Neat or Calan Doren NP and As needed       CAP (community acquired pneumonia) Recently admitted in April for pneumonia.  Clinically improved.  Check chest x-ray today.  Chronic diastolic CHF (congestive heart failure) (HCC) Appears euvolemic on exam.  No evidence of acute volume overload.  Continue current management and follow-up with cardiology  Asthma PFTs are consistent with asthma. -Will try Advair 115 to see if we can better manage symptom burden and increase activity tolerance. Asthma action plan discussed  Plan  Patient Instructions  Keep up the good work Continue on CPAP at bedtime with oxygen. Work on healthy weight loss Do not drive if sleepy Activity as tolerated.  CT chest in  March 2025 to follow lung nodules  Begin Advair 115 2 puff Twice daily, rinse after use   Albuterol inhaler 1-2 puffs every 4-6hr as needed .  Chest xray today  Follow up with in 4 months with Dr. Wynona Neat or Suhailah Kwan NP and As needed          Rubye Oaks, NP 08/31/2022

## 2022-08-31 NOTE — Patient Instructions (Addendum)
Keep up the good work Continue on CPAP at bedtime with oxygen. Work on healthy weight loss Do not drive if sleepy Activity as tolerated.  CT chest in  March 2025 to follow lung nodules  Begin Advair 115 2 puff Twice daily, rinse after use   Albuterol inhaler 1-2 puffs every 4-6hr as needed .  Chest xray today  Follow up with in 4 months with Dr. Wynona Neat or Babe Clenney NP and As needed

## 2022-08-31 NOTE — Assessment & Plan Note (Signed)
PFTs are consistent with asthma. -Will try Advair 115 to see if we can better manage symptom burden and increase activity tolerance. Asthma action plan discussed  Plan  Patient Instructions  Keep up the good work Continue on CPAP at bedtime with oxygen. Work on healthy weight loss Do not drive if sleepy Activity as tolerated.  CT chest in  March 2025 to follow lung nodules  Begin Advair 115 2 puff Twice daily, rinse after use   Albuterol inhaler 1-2 puffs every 4-6hr as needed .  Chest xray today  Follow up with in 4 months with Dr. Wynona Neat or Dashia Caldeira NP and As needed

## 2022-09-05 ENCOUNTER — Other Ambulatory Visit (HOSPITAL_COMMUNITY): Payer: Self-pay

## 2022-09-06 ENCOUNTER — Ambulatory Visit (INDEPENDENT_AMBULATORY_CARE_PROVIDER_SITE_OTHER): Payer: MEDICAID | Admitting: Licensed Clinical Social Worker

## 2022-09-06 ENCOUNTER — Other Ambulatory Visit: Payer: Self-pay

## 2022-09-06 ENCOUNTER — Encounter: Payer: Self-pay | Admitting: Pharmacist

## 2022-09-06 ENCOUNTER — Other Ambulatory Visit (HOSPITAL_COMMUNITY): Payer: Self-pay

## 2022-09-06 DIAGNOSIS — F411 Generalized anxiety disorder: Secondary | ICD-10-CM

## 2022-09-06 DIAGNOSIS — F331 Major depressive disorder, recurrent, moderate: Secondary | ICD-10-CM | POA: Diagnosis not present

## 2022-09-06 NOTE — Progress Notes (Signed)
THERAPIST PROGRESS NOTE  Session Time: 30   Participation Level: Active  Behavioral Response: CasualAlertAnxious and Depressed  Type of Therapy: Individual Therapy  Treatment Goals addressed:  Active     Anxiety     LTG: Christian [name] will score less than 5 on the Generalized Anxiety Disorder 7 Scale (GAD-7)  (Completed/Met)     Start:  05/11/22    Expected End:  10/28/22    Resolved:  08/17/22    Goal Note     Scpre taken today and last session scored below a 5 each time          STG: Christian [name] will complete at least 80% of assigned homework  (Progressing)     Start:  05/11/22    Expected End:  10/28/22         STG: Christian [name] will practice problem solving skills 3 times per week for the next 4 weeks.  (Progressing)     Start:  05/11/22    Expected End:  10/28/22         identify 3 trigger for anxiety  (Progressing)     Start:  05/11/22    Expected End:  10/28/22         create  3 coping skills for depression      Start:  05/11/22    Expected End:  10/28/22       Goal Note     Gratitude exercises Growth mindset           Review results of GAD-7 with Christian [name] to track progress (Completed)     Start:  05/11/22    End:  06/28/22    Intervention Note     Reviewed with patient during session.            OP Depression     LTG: Reduce frequency, intensity, and duration of depression symptoms so that daily functioning is improved (Progressing)     Start:  05/11/22    Expected End:  10/28/22         LTG: Increase coping skills to manage depression and improve ability to perform daily activities (Progressing)     Start:  05/11/22    Expected End:  10/28/22         LTG: Christian [name] will score less than 10 on the Patient Health Questionnaire (PHQ-9)  (Completed/Met)     Start:  05/11/22    Expected End:  10/28/22    Resolved:  08/17/22    Goal Note     Scpre taken today and last session scored below a 5 each time             ProgressTowards Goals:  Progressing  Interventions: CBT, Motivational Interviewing, and Supportive  Summary: Christian [name] is a 47 y.o. male who presents with depressed and anxious mood\affect.  Patient was pleasant, cooperative, maintained good eye contact. Therapy session was dressed casually.  Patient reports primary stressors of self-esteem, housing, and financials.  Patient reports that he is looking for temporary employment on a part-time basis until he can get approved for Social Security disability.  LCSW advised patient to speak with his lawyers about a, can make while applying for disability prior to applying for jobs.  LCSW solution-focused therapy to provide patient resources for [place] works.   Other stressors for patient are housing.  Patient reports that he is still staying in a shelter.  Patient reports that during the day he has to find ways to occupy his time as the  shelter closes.  Christian [name] states that the shelter will open up early on days that it is extremely hot.  Final stressor for patient is self-esteem.  Patient reports this negative self talk.  Patient reports hard to finding positive mindset while not having any income and being in a shelter.   Suicidal/Homicidal: Nowithout intent/plan  Therapist Response:     Interventions/Plan: LCSW and patient went over worksheet for "growth mindset".  And LCSW and patient went over "gratitude exercises".  LCSW provided patient with paper copies to utilize 1 time daily whether it is growing mindset and/or gratitude exercises.  This is to help patient improve self-esteem as patient reports it is a stressor this is also to help patient create coping skills for anxiety and depression.  LCSW supportive therapy for praise and encouragement.  LCSW used motivational interviewing for reflective listening and open-ended questions.  Plan: Return again in 3 weeks.  Diagnosis: MDD (major depressive disorder), recurrent episode, moderate (HCC)  GAD (generalized  anxiety disorder)  Collaboration of Care: Other None today   Patient/Guardian was advised Release of Information must be obtained prior to any record release in order to collaborate their care with an outside provider. Patient/Guardian was advised if they have not already done so to contact the registration department to sign all necessary forms in order for [place] to release information regarding their care.   Consent: Patient/Guardian gives verbal consent for treatment and assignment of benefits for services provided during this visit. Patient/Guardian expressed understanding and agreed to proceed.   Weber Cooks, LCSW 09/06/2022

## 2022-09-08 ENCOUNTER — Other Ambulatory Visit (HOSPITAL_COMMUNITY): Payer: Self-pay

## 2022-09-13 ENCOUNTER — Encounter (HOSPITAL_COMMUNITY): Payer: Self-pay | Admitting: Physician Assistant

## 2022-09-14 ENCOUNTER — Other Ambulatory Visit: Payer: Self-pay

## 2022-09-17 ENCOUNTER — Encounter (HOSPITAL_COMMUNITY): Payer: Self-pay

## 2022-09-19 ENCOUNTER — Other Ambulatory Visit (HOSPITAL_COMMUNITY): Payer: Self-pay

## 2022-09-20 ENCOUNTER — Other Ambulatory Visit (HOSPITAL_COMMUNITY): Payer: Self-pay

## 2022-09-21 ENCOUNTER — Other Ambulatory Visit: Payer: Self-pay | Admitting: Critical Care Medicine

## 2022-09-21 ENCOUNTER — Encounter: Payer: Self-pay | Admitting: *Deleted

## 2022-09-21 ENCOUNTER — Encounter: Payer: Self-pay | Admitting: Physician Assistant

## 2022-09-21 ENCOUNTER — Other Ambulatory Visit (HOSPITAL_COMMUNITY): Payer: Self-pay

## 2022-09-21 MED ORDER — CIPROFLOXACIN-DEXAMETHASONE 0.3-0.1 % OT SUSP
4.0000 [drp] | Freq: Two times a day (BID) | OTIC | 0 refills | Status: DC
  Filled 2022-09-21: qty 7.5, 19d supply, fill #0

## 2022-09-21 NOTE — Progress Notes (Cosign Needed Addendum)
Pt seen by Dr Delford Field.  He is having problems w/ SOB w/ exertion and also at night.   He is compliant w/ CPAP, not w/ O2.  He is compliant w/ Advair and prn albuterol. But does not use the albuterol.  Ms Parrett told him he had mild asthma.   He was given instructions on how to use the inhaler.   Sometimes feels a little stuffy, no sinus drainage.  Has external otitis R ear, will get Ciprodex drops for that. They will be mailed from [facility].  Lungs are clear, no sinus infection.   Tylenol helps w/ his knees as do the lidocaine patches.  He was given a 6-pack of the patches,  more Tyelenol.  Theodore Demark, PA-C 09/21/2022 2:53 PM

## 2022-09-21 NOTE — Congregational Nurse Program (Signed)
  Dept: [phone]   Congregational Nurse Program Note  Date of Encounter: 09/21/2022  Past Medical History: Past Medical History:  Diagnosis Date   Allergic rhinitis    Anxiety    CHF (congestive heart failure) (HCC)    Depression    Depression    Elevated blood pressure reading without diagnosis of hypertension    History of chicken pox    Obesity    Pleural effusion    Sepsis (HCC)    Sleep apnea    O2 at night    Encounter Details:  CNP Questionnaire - 09/21/22 1300       Questionnaire   Ask client: Do you give verbal consent for me to treat you today? Yes    Student Assistance N/A    Location Patient Served  [facility]    Visit Setting with Client Organization    Patient Status Unhoused    Insurance Medicaid    Insurance/Financial Assistance Referral N/A    Medication N/A    Medical Provider Yes    Screening Referrals Made N/A    Medical Referrals Made Non-[facility] PCP/Clinic    Medical Appointment Made Non-[facility] PCP/clinic    Recently w/o PCP, now 1st time PCP visit completed due to CNs referral or appointment made N/A    Food N/A    Transportation N/A    Housing/Utilities No permanent housing    Interpersonal Safety N/A    Interventions Advocate/Support    Abnormal to Normal Screening Since Last CN Visit N/A    Screenings CN Performed Blood Pressure;Pulse Ox;Weight    Sent Client to Lab for: N/A    Did client attend any of the following based off CNs referral or appointments made? N/A    ED Visit Averted N/A    Life-Saving Intervention Made N/A            Client came to nurse's office reporting feeling "winded" for the past three days. He reports starting advair inhaler two weeks ago. Checked vitals. Blood pressure (!) 141/83, pulse 80, weight (!) 410 lb 3.2 oz (186.1 kg), SpO2 93%. Offered support and encouragement. Scheduled client to see Dr Delford Field at [facility] 2:30 this afternoon and completed triage form. London Tarnowski W RN CN

## 2022-09-22 ENCOUNTER — Other Ambulatory Visit (HOSPITAL_COMMUNITY): Payer: Self-pay

## 2022-09-28 ENCOUNTER — Ambulatory Visit (HOSPITAL_COMMUNITY): Payer: MEDICAID | Admitting: Licensed Clinical Social Worker

## 2022-10-04 ENCOUNTER — Other Ambulatory Visit (HOSPITAL_COMMUNITY): Payer: Self-pay

## 2022-10-06 ENCOUNTER — Other Ambulatory Visit: Payer: Self-pay | Admitting: Critical Care Medicine

## 2022-10-06 ENCOUNTER — Encounter: Payer: Self-pay | Admitting: Physician Assistant

## 2022-10-06 DIAGNOSIS — G8929 Other chronic pain: Secondary | ICD-10-CM

## 2022-10-06 NOTE — Progress Notes (Signed)
Ortho referral  

## 2022-10-06 NOTE — Progress Notes (Signed)
Pt seen by Dr Delford Field  Still having knee pain. R>L  He has tried Tylenol, Voltaren gel and lidocaine patches w/out relief  He had injections from Ortho Dr in the L and then in the R knee, w/ temporary relief. These were done  03/14 and  06/06/2022.  Richardean Canal [facility] w/ [facility] did the injections.   He has been given the meloxicam but has quit taking it.   PCP:  [name], MD  He was encouraged to take the meloxicam and f/u with Ortho and PCP.  Theodore Demark, PA-C 10/06/2022 4:56 PM

## 2022-10-11 ENCOUNTER — Ambulatory Visit (INDEPENDENT_AMBULATORY_CARE_PROVIDER_SITE_OTHER): Payer: MEDICAID | Admitting: Licensed Clinical Social Worker

## 2022-10-11 DIAGNOSIS — F331 Major depressive disorder, recurrent, moderate: Secondary | ICD-10-CM

## 2022-10-11 DIAGNOSIS — F411 Generalized anxiety disorder: Secondary | ICD-10-CM

## 2022-10-11 NOTE — Progress Notes (Signed)
THERAPIST PROGRESS NOTE  Session Time: 30  Participation Level: Active  Behavioral Response: CasualAlertAnxious and Depressed  Type of Therapy: Individual Therapy  Treatment Goals addressed:  Active     Anxiety     LTG: Christian Christian [name] will score less than 5 on the Generalized Anxiety Disorder 7 Scale (GAD-7)  (Completed/Met)     Start:  05/11/22    Expected End:  10/28/22    Resolved:  08/17/22    Goal Note     Scpre taken today and last session scored below a 5 each time          STG: Christian Christian [name] will complete at least 80% of assigned homework  (Progressing)     Start:  05/11/22    Expected End:  10/28/22         STG: Christian Christian [name] will practice problem solving skills 3 times per week for the next 4 weeks.  (Progressing)     Start:  05/11/22    Expected End:  10/28/22         identify 3 trigger for anxiety  (Progressing)     Start:  05/11/22    Expected End:  10/28/22       Goal Note     Conflict at the shelter          create  3 coping skills for depression      Start:  05/11/22    Expected End:  10/28/22       Goal Note     Gratitude exercises Growth mindset           Discuss risks and benefits of medication treatment options for this problem and prescribe as indicated     Start:  05/11/22         Encourage Christian Christian [name] take psychotropic medication(s) as prescribed     Start:  05/11/22         Work with Christian Christian [name] identify a minimum of 3 alternative coping behaviors Christian [name] avoidance.      Start:  05/11/22         Create a weekly activity schedule     Start:  05/11/22         Review results of GAD-7 with Christian Christian [name] track progress (Completed)     Start:  05/11/22    End:  06/28/22    Intervention Note     Reviewed with patient during session.          Work with Christian Christian [name] track symptoms, triggers, and/or skill use through a mood chart, diary card, or journal (Completed)     Start:  05/11/22    End:  06/07/22      Perform psychoeducation regarding  anxiety disorders (Completed)     Start:  05/11/22    End:  06/07/22      Provide Christian Christian [name] with educational information and reading material on anxiety, its causes, and symptoms.  (Completed)     Start:  05/11/22    End:  06/07/22      Work with patient individually Christian [name] identify the major components of a recent episode of anxiety: physical symptoms, major thoughts and images, and major behaviors they experienced (Completed)     Start:  05/11/22    End:  06/07/22      Work with Christian Christian [name] identify 3 personal goals for managing their anxiety Christian [name] work on during current treatment.  (Completed)     Start:  05/11/22    End:  06/28/22  Work with Christian Christian [name] identify a minimum of 3 consequences of avoidance.  (Completed)     Start:  05/11/22    End:  06/28/22        OP Depression     LTG: Reduce frequency, intensity, and duration of depression symptoms so that daily functioning is improved (Progressing)     Start:  05/11/22    Expected End:  10/28/22         LTG: Increase coping skills Christian [name] manage depression and improve ability Christian [name] perform daily activities (Progressing)     Start:  05/11/22    Expected End:  10/28/22         LTG: Christian Christian [name] will score less than 10 on the Patient Health Questionnaire (PHQ-9)  (Completed/Met)     Start:  05/11/22    Expected End:  10/28/22    Resolved:  08/17/22    Goal Note     Scpre taken today and last session scored below a 5 each time          Work with Christian Christian [name] track symptoms, triggers, and/or skill use through a mood chart, diary card, or journal (Completed)     Start:  05/11/22    End:  06/07/22      Therapist will administer the PHQ-9 at weekly intervals for the next 8 weeks (Completed)     Start:  05/11/22    End:  06/28/22      Work with Christian Christian [name] identify the major components of a recent episode of depression: physical symptoms, major thoughts and images, and major behaviors they experienced (Completed)     Start:  05/11/22     End:  06/07/22         ProgressTowards Goals: Progressing  Interventions: CBT, Motivational Interviewing, and Supportive  Summary: Christian Christian [name] is a 47 y.o. male who presents with depressed and anxious mood\affect.  Patient was pleasant, cooperative, maintained good eye contact.  He engaged well in therapy session was dressed casually.  Primary stressor is housing and financials.  Patient reports that he has been denied Social Security disability income.  Patient reports that he is appealing the decision but is awaiting a determination.  Christian Christian [name] reports that he is eligible Christian [name] apply for Social Security income and has started that process as well.  In the meantime patient reports he has been attending a program called [facility] where they give him life skills such as resume building and interview experience Christian [name] help build the career.  Patient reports that he is still at the shelter but reports some irritability with people that are staying there.  Christian [name] reports that he fears if he becomes too uncomfortable he might have a confrontation that gets him kicked out of the shelter.  Suicidal/Homicidal: Nowithout intent/plan  Therapist Response:     Intervention/Plan: LCSW supportive therapy for praise and encouragement.  LCSW used motivational interviewing for positive affirmations, reflective listening, open-ended questions.  LCSW spoke with patient about conflict resolution such as "not matching energy."  LCSW also spoke about tone of voice when handling conflict.  Plan: Return again in 3 weeks.  Diagnosis: MDD (major depressive disorder), recurrent episode, moderate (HCC)  GAD (generalized anxiety disorder)  Collaboration of Care: Other None today   Patient/Guardian was advised Release of Information must be obtained prior Christian [name] any record release in order Christian [name] collaborate their care with an outside provider. Patient/Guardian was advised if they have not already done so Christian [name] contact the  registration department  Christian [name] sign all necessary forms in order for [place] Christian [name] release information regarding their care.   Consent: Patient/Guardian gives verbal consent for treatment and assignment of benefits for services provided during this visit. Patient/Guardian expressed understanding and agreed Christian [name] proceed.   Weber Cooks, LCSW 10/11/2022

## 2022-10-14 ENCOUNTER — Telehealth (INDEPENDENT_AMBULATORY_CARE_PROVIDER_SITE_OTHER): Payer: MEDICAID | Admitting: Physician Assistant

## 2022-10-14 ENCOUNTER — Other Ambulatory Visit: Payer: Self-pay

## 2022-10-14 ENCOUNTER — Encounter (HOSPITAL_COMMUNITY): Payer: Self-pay | Admitting: Physician Assistant

## 2022-10-14 ENCOUNTER — Other Ambulatory Visit (HOSPITAL_COMMUNITY): Payer: Self-pay

## 2022-10-14 DIAGNOSIS — F411 Generalized anxiety disorder: Secondary | ICD-10-CM | POA: Diagnosis not present

## 2022-10-14 DIAGNOSIS — F331 Major depressive disorder, recurrent, moderate: Secondary | ICD-10-CM

## 2022-10-14 MED ORDER — SERTRALINE HCL 100 MG PO TABS
150.0000 mg | ORAL_TABLET | Freq: Every day | ORAL | 2 refills | Status: DC
Start: 2022-10-14 — End: 2022-12-16
  Filled 2022-10-14 – 2022-10-18 (×2): qty 45, 30d supply, fill #0
  Filled 2022-11-17: qty 45, 30d supply, fill #1

## 2022-10-14 MED ORDER — TRAZODONE HCL 50 MG PO TABS
50.0000 mg | ORAL_TABLET | Freq: Every day | ORAL | 2 refills | Status: DC
Start: 2022-10-14 — End: 2022-12-16
  Filled 2022-10-14 – 2022-10-18 (×2): qty 30, 30d supply, fill #0
  Filled 2022-11-17: qty 30, 30d supply, fill #1

## 2022-10-14 NOTE — Progress Notes (Signed)
BH MD/PA/NP OP Progress Note  Virtual Visit via Video Note  I connected with Christian [name] on 10/14/22 at  2:00 PM EDT by a video enabled telemedicine application and verified that I am speaking with the correct person using two identifiers.  Location: Patient: Home Provider: Clinic   I discussed the limitations of evaluation and management by telemedicine and the availability of in person appointments. The patient expressed understanding and agreed to proceed.  Follow Up Instructions:   I discussed the assessment and treatment plan with the patient. The patient was provided an opportunity to ask questions and all were answered. The patient agreed with the plan and demonstrated an understanding of the instructions.   The patient was advised to call back or seek an in-person evaluation if the symptoms worsen or if the condition fails to improve as anticipated.  I provided 27 minutes of non-face-to-face time during this encounter.  Meta Hatchet, PA    10/14/2022 5:29 PM Christian [name]  MRN:  [id number]  Chief Complaint:  Chief Complaint  Patient presents with   Follow-up   Medication Refill   HPI:   Christian [name] is a 47 year old, Caucasian male with a past psychiatric history significant for anxiety and major depressive disorder who presents to [facility] via virtual video visit for follow-up and medication management.  Patient is currently being managed on the following psychiatric medication:   Sertraline 150 mg daily. Trazodone 50 mg daily  Patient reports that he currently resides at a homeless shelter.  In regards to his medication regimen, patient states that he experiences some drowsiness towards the afternoon but denies falling asleep.  He reports that the issue is not too serious at this time.  Patient reports that he experiences panic attacks attributed to thoughts related to being down about his current situation.   Patient states that he often ruminates about what he could have done to avoid his current predicament.  He states that his mind often wanders prior to going to sleep.  Patient endorses depression and rates his depression as 4 out of 10 with 10 being most severe.  Patient endorses depressive episodes 1 to 2 times per week.  Patient endorses the following depressive symptoms: feelings of guilt, low mood, and low self-esteem.  Patient reports that he often feels like a failure.  In addition to his depressive symptoms, patient also endorses anxiety he rates a 5 out of 10.  One of the patient's stressors revolves around recently being denied for disability.  Patient states that he is currently appealing the process and waiting to hear back on the results.  Patient also states that he is worried about not having housing and possibly becoming home lastly, patient states that he is worried about working since he has not worked since losing his previous place of residence.  A PHQ-9 screen was performed with the patient scoring an 8.  A GAD-7 screen was also performed with the patient scoring an 8.  Patient is alert and oriented x 4, calm, cooperative, and fully engaged in conversation during the encounter.  Although patient is down about his current situation, he endorses feeling optimistic and hopeful.  Patient denies suicidal or homicidal ideations.  He further denies auditory or visual hallucinations and does not appear to be responding to internal/external stimuli.  Patient endorses good sleep and receives on average 6 to 8 hours of sleep each night.  Patient endorses good appetite and eats on  average 3 meals per day along with snacking.  Patient denies alcohol consumption, tobacco use, or illicit drug use.  Visit Diagnosis:    ICD-10-CM   1. GAD (generalized anxiety disorder)  F41.1 sertraline (ZOLOFT) 100 MG tablet    2. MDD (major depressive disorder), recurrent episode, moderate (HCC)  F33.1 sertraline  (ZOLOFT) 100 MG tablet    traZODone (DESYREL) 50 MG tablet      Past Psychiatric History:  Patient reports that when he was first hospitalized due to mental health, he was diagnosed with anxiety and depression. Patient states that he was started on antidepressants during that time but never stayed consistent with his medication management   Past Medical History:  Past Medical History:  Diagnosis Date   Allergic rhinitis    Anxiety    CHF (congestive heart failure) (HCC)    Depression    Depression    Elevated blood pressure reading without diagnosis of hypertension    History of chicken pox    Obesity    Pleural effusion    Sepsis (HCC)    Sleep apnea    O2 at night    Past Surgical History:  Procedure Laterality Date   COLON SURGERY     perforated bowel   TONSILLECTOMY AND ADENOIDECTOMY  03/01/1983    Family Psychiatric History:  Mother - patient reports that his mother had cancer when he was young and she was referred to a mental health facility Uncle (paternal) - possible schizophrenia, patient reports that his family suspected that his uncle may have been schizophrenic because he would see things   Family history of suicide: Patient denies, but states that his mother's, who was adopted, cousin attempted suicide.  Patient states that they were not blood related. Family history of homicide: Patient denies Family history of substance abuse: Patient denies  Family History:  Family History  Problem Relation Age of Onset   Stroke Mother    Diabetes Mother        pre-diabetes   Heart disease Mother        CHF, pacer   Cancer Mother        lymphoma   Stomach cancer Mother    Clotting disorder Mother    Coronary artery disease Father 73       MI   Hypertension Father    Hyperlipidemia Father    Diabetes Father    Heart attack Father    Migraines Sister    Stroke Paternal Grandmother    Diabetes Paternal Grandmother    Heart disease Paternal Grandfather     Parkinson's disease Paternal Grandfather    Heart attack Paternal Grandfather     Social History:  Social History   Socioeconomic History   Marital status: Single    Spouse name: Not on file   Number of children: 0   Years of education: Not on file   Highest education level: 12th grade  Occupational History   Not on file  Tobacco Use   Smoking status: Never   Smokeless tobacco: Never   Tobacco comments:    Living in homeless shelter  Vaping Use   Vaping status: Never Used  Substance and Sexual Activity   Alcohol use: Yes    Alcohol/week: 2.0 standard drinks of alcohol    Types: 2 Cans of beer per week    Comment: 1-2 beers 1-2 days a week   Drug use: Never   Sexual activity: Not Currently  Other Topics Concern   Not on  file  Social History Narrative   Caffeine: occasional   Lives alone, near parents.     Occupation: works at [employer] part time   Edu: some college   Diet: some water, some fruits/vegetables, 1-2x/wk red meat, fish 1x/wk   Social Determinants of Health   Financial Resource Strain: High Risk (07/01/2022)   Overall Financial Resource Strain (CARDIA)    Difficulty of Paying Living Expenses: Very hard  Food Insecurity: Food Insecurity Present (07/01/2022)   Hunger Vital Sign    Worried About Running Out of Food in the Last Year: Sometimes true    Ran Out of Food in the Last Year: Sometimes true  Transportation Needs: Unmet Transportation Needs (07/01/2022)   PRAPARE - Administrator, Civil Service (Medical): Yes    Lack of Transportation (Non-Medical): Yes  Physical Activity: Inactive (07/01/2022)   Exercise Vital Sign    Days of Exercise per Week: 0 days    Minutes of Exercise per Session: 20 min  Stress: Stress Concern Present (07/01/2022)   [employer] of Occupational Health - Occupational Stress Questionnaire    Feeling of Stress : To some extent  Social Connections: Moderately Integrated (07/01/2022)   Social Connection and Isolation  Panel [NHANES]    Frequency of Communication with Friends and Family: More than three times a week    Frequency of Social Gatherings with Friends and Family: Once a week    Attends Religious Services: More than 4 times per year    Active Member of [employer] or Organizations: Yes    Attends Banker Meetings: 1 to 4 times per year    Marital Status: Never married  Recent Concern: Social Connections - Moderately Isolated (05/11/2022)   Social Connection and Isolation Panel [NHANES]    Frequency of Communication with Friends and Family: More than three times a week    Frequency of Social Gatherings with Friends and Family: Twice a week    Attends Religious Services: More than 4 times per year    Active Member of [employer] or Organizations: No    Attends Banker Meetings: Never    Marital Status: Never married    Allergies:  Allergies  Allergen Reactions   Shellfish Allergy Nausea Only    Metabolic Disorder Labs: Lab Results  Component Value Date   HGBA1C 6.0 (H) 12/24/2020   MPG 125.5 10/09/2020   No results found for: "PROLACTIN" Lab Results  Component Value Date   CHOL 153 12/24/2020   TRIG 126 12/24/2020   HDL 42 12/24/2020   CHOLHDL 3.6 12/24/2020   LDLCALC 88 12/24/2020   Lab Results  Component Value Date   TSH 2.290 12/24/2020   TSH 2.650 10/09/2020    Therapeutic Level Labs: No results found for: "LITHIUM" No results found for: "VALPROATE" No results found for: "CBMZ"  Current Medications: Current Outpatient Medications  Medication Sig Dispense Refill   albuterol (VENTOLIN HFA) 108 (90 Base) MCG/ACT inhaler Inhale 2 puffs into the lungs every 6 (six) hours as needed for wheezing or shortness of breath. 8 g 3   ciprofloxacin-dexamethasone (CIPRODEX) OTIC suspension Place 4 drops into the right ear 2 (two) times daily. 7.5 mL 0   fluticasone (FLONASE) 50 MCG/ACT nasal spray Spray 2 sprays into both nostrils once daily. 16 g 2    fluticasone-salmeterol (ADVAIR HFA) 115-21 MCG/ACT inhaler Inhale 2 puffs into the lungs 2 (two) times daily. 12 g 12   meloxicam (MOBIC) 15 MG tablet Take 1 tablet (  15 mg total) by mouth daily. 30 tablet 2   OXYGEN Inhale 2 L/min into the lungs at bedtime.     sertraline (ZOLOFT) 100 MG tablet Take 1.5 tablets (150 mg total) by mouth daily. 45 tablet 2   torsemide (DEMADEX) 20 MG tablet Take 2 tablets (40 mg total) by mouth 2 (two) times daily. 120 tablet 2   traZODone (DESYREL) 50 MG tablet Take 1 tablet (50 mg total) by mouth at bedtime. 30 tablet 2   vitamin B-12 (CYANOCOBALAMIN) 100 MCG tablet Take 1 tablet (100 mcg total) by mouth once daily. 30 tablet 0   Current Facility-Administered Medications  Medication Dose Route Frequency Provider Last Rate Last Admin   cholecalciferol (VITAMIN D3) tablet 1,000 Units  1,000 Units Oral Daily Regalado, Belkys A, MD         Musculoskeletal: Strength & Muscle Tone: within normal limits Gait & Station: normal Patient leans: N/A  Psychiatric Specialty Exam: Review of Systems  Psychiatric/Behavioral:  Positive for sleep disturbance. Negative for decreased concentration, dysphoric mood, hallucinations, self-injury and suicidal ideas. The patient is nervous/anxious. The patient is not hyperactive.     There were no vitals taken for this visit.There is no height or weight on file to calculate BMI.  General Appearance: Casual  Eye Contact:  Good  Speech:  Clear and Coherent and Normal Rate  Volume:  Normal  Mood:  Anxious and Depressed  Affect:  Appropriate  Thought Process:  Coherent, Goal Directed, and Descriptions of Associations: Intact  Orientation:  Full (Time, Place, and Person)  Thought Content: WDL   Suicidal Thoughts:  No  Homicidal Thoughts:  No  Memory:  Immediate;   Good Recent;   Good Remote;   Fair  Judgement:  Fair  Insight:  Fair  Psychomotor Activity:  Normal  Concentration:  Concentration: Good and Attention Span: Good   Recall:  Good  Fund of Knowledge: Fair  Language: Good  Akathisia:  No  Handed:  Left  AIMS (if indicated): not done  Assets:  Communication Skills Desire for Improvement Housing Social Support  ADL's:  Intact  Cognition: WNL  Sleep:   Patient has a history of sleep apnea. Patient receives on average 6 hours of sleep per night.   Screenings: AUDIT    Flowsheet Row Office Visit from 07/05/2022 in [facility] at [facility]  Alcohol Use Disorder Identification Test Final Score (AUDIT) 3      GAD-7    Flowsheet Row Video Visit from 10/14/2022 in [facility] Office Visit from 08/17/2022 in [facility] Primary Care at [facility] Counselor from 07/27/2022 in [facility] Clinical Support from 07/12/2022 in [facility] Counselor from 06/28/2022 in [facility]  Total GAD-7 Score 8 0 4 8 10       PHQ2-9    Flowsheet Row Video Visit from 10/14/2022 in [facility] Office Visit from 08/17/2022 in [facility] at [facility] Clinical Support from 07/12/2022 in [facility] Counselor from 06/28/2022 in [facility] Counselor from 05/11/2022 in [facility]  PHQ-2 Total Score 2 0 1 1 3   PHQ-9 Total Score 8 0 -- 4 10      Flowsheet Row Video [facility] from 10/14/2022 in [facility] Clinical Support from 07/12/2022 in [facility] ED to Hosp-Admission (Discharged) from 06/14/2022 in [facility]  C-SSRS RISK CATEGORY No Risk Low Risk No Risk        Assessment and Plan:   Christian [name] is a 47 year old, Caucasian male with a past psychiatric history significant for anxiety and major depressive disorder who presents to [facility] via virtual video visit for follow-up and medication management.  Patient presents to the encounter stating that he has been experiencing some drowsiness towards the afternoon and is not sure if it is attributed to his use of medications.  Patient denies his drowsiness being a major issue of concern at this time.  Patient endorses some depression as well as anxiety attributed to the current predicament he is N.  Patient states that he is currently living in a homeless shelter and was recently denied disability and is currently appealing the process.  Although patient continues to endorse depression and anxiety, patient would like to continue taking his medications as prescribed.  Patient's medications to be e-prescribed to pharmacy of choice.  Collaboration of Care: Collaboration of Care: Medication Management AEB provider managing patient's psychiatric medications, Psychiatrist AEB patient being seen by mental health provider at this facility, and Referral or follow-up with counselor/therapist AEB patient being seen by a licensed clinical social worker at this facility  Patient/Guardian was advised Release of Information must be obtained prior to any record release in order to collaborate their care with an outside provider. Patient/Guardian was advised if they have not already done so to contact the registration department to sign all necessary forms in order for [place] to release information regarding their care.   Consent: Patient/Guardian gives verbal consent for treatment and assignment of benefits for services provided during this visit. Patient/Guardian expressed understanding and agreed to proceed.   1. GAD (generalized anxiety disorder)  - sertraline (ZOLOFT) 100 MG tablet; Take 1.5 tablets (150 mg total) by mouth daily.  Dispense: 45 tablet; Refill: 2  2. MDD (major depressive disorder), recurrent episode, moderate (HCC)  - sertraline (ZOLOFT) 100 MG tablet; Take 1.5 tablets  (150 mg total) by mouth daily.  Dispense: 45 tablet; Refill: 2 - traZODone (DESYREL) 50 MG tablet; Take 1 tablet (50 mg total) by mouth at bedtime.  Dispense: 30 tablet; Refill: 2  Patient to follow-up in 2 months Provider spent a total of 27 minutes with the patient/reviewing patient's chart  Meta Hatchet, PA 10/14/2022, 5:29 PM

## 2022-10-17 ENCOUNTER — Emergency Department (HOSPITAL_COMMUNITY): Payer: MEDICAID

## 2022-10-17 ENCOUNTER — Emergency Department (HOSPITAL_COMMUNITY)
Admission: EM | Admit: 2022-10-17 | Discharge: 2022-10-17 | Disposition: A | Discharge status: Home / Self Care | Payer: MEDICAID | Attending: Emergency Medicine | Admitting: Emergency Medicine

## 2022-10-17 ENCOUNTER — Ambulatory Visit (INDEPENDENT_AMBULATORY_CARE_PROVIDER_SITE_OTHER): Payer: MEDICAID | Admitting: Physician Assistant

## 2022-10-17 ENCOUNTER — Encounter (HOSPITAL_COMMUNITY): Payer: Self-pay | Admitting: *Deleted

## 2022-10-17 ENCOUNTER — Other Ambulatory Visit: Payer: Self-pay

## 2022-10-17 ENCOUNTER — Encounter: Payer: Self-pay | Admitting: Physician Assistant

## 2022-10-17 VITALS — Ht 73.0 in | Wt >= 6400 oz

## 2022-10-17 DIAGNOSIS — M1711 Unilateral primary osteoarthritis, right knee: Secondary | ICD-10-CM | POA: Diagnosis not present

## 2022-10-17 DIAGNOSIS — R059 Cough, unspecified: Secondary | ICD-10-CM | POA: Diagnosis present

## 2022-10-17 DIAGNOSIS — U071 COVID-19: Secondary | ICD-10-CM | POA: Diagnosis not present

## 2022-10-17 DIAGNOSIS — Z5901 Sheltered homelessness: Secondary | ICD-10-CM | POA: Insufficient documentation

## 2022-10-17 DIAGNOSIS — M1712 Unilateral primary osteoarthritis, left knee: Secondary | ICD-10-CM | POA: Diagnosis not present

## 2022-10-17 LAB — CBC WITH DIFFERENTIAL/PLATELET
Abs Immature Granulocytes: 0.02 10*3/uL (ref 0.00–0.07)
Basophils Absolute: 0 10*3/uL (ref 0.0–0.1)
Basophils Relative: 1 %
Eosinophils Absolute: 0 10*3/uL (ref 0.0–0.5)
Eosinophils Relative: 0 %
HCT: 44.7 % (ref 39.0–52.0)
Hemoglobin: 14.4 g/dL (ref 13.0–17.0)
Immature Granulocytes: 0 %
Lymphocytes Relative: 6 %
Lymphs Abs: 0.5 10*3/uL — ABNORMAL LOW (ref 0.7–4.0)
MCH: 26.5 pg (ref 26.0–34.0)
MCHC: 32.2 g/dL (ref 30.0–36.0)
MCV: 82.2 fL (ref 80.0–100.0)
Monocytes Absolute: 0.2 10*3/uL (ref 0.1–1.0)
Monocytes Relative: 2 %
Neutro Abs: 7.4 10*3/uL (ref 1.7–7.7)
Neutrophils Relative %: 91 %
Platelets: 283 10*3/uL (ref 150–400)
RBC: 5.44 MIL/uL (ref 4.22–5.81)
RDW: 13.5 % (ref 11.5–15.5)
WBC: 8.2 10*3/uL (ref 4.0–10.5)
nRBC: 0 % (ref 0.0–0.2)

## 2022-10-17 LAB — BASIC METABOLIC PANEL
Anion gap: 15 (ref 5–15)
BUN: 19 mg/dL (ref 6–20)
CO2: 25 mmol/L (ref 22–32)
Calcium: 9.3 mg/dL (ref 8.9–10.3)
Chloride: 103 mmol/L (ref 98–111)
Creatinine, Ser: 1.19 mg/dL (ref 0.61–1.24)
GFR, Estimated: >60 mL/min (ref >60)
Glucose, Bld: 197 mg/dL — ABNORMAL HIGH (ref 70–99)
Potassium: 3.9 mmol/L (ref 3.5–5.1)
Sodium: 143 mmol/L (ref 135–145)

## 2022-10-17 LAB — GROUP A STREP BY PCR: Group A Strep by PCR: NOT DETECTED

## 2022-10-17 LAB — SARS CORONAVIRUS 2 BY RT PCR: SARS Coronavirus 2 by RT PCR: POSITIVE — AB

## 2022-10-17 MED ORDER — EPINEPHRINE 0.3 MG/0.3ML IJ SOAJ
0.3000 mg | Freq: Once | INTRAMUSCULAR | Status: DC
  Filled 2022-10-17: qty 0.3

## 2022-10-17 MED ORDER — LIDOCAINE HCL 1 % IJ SOLN
3.0000 mL | INTRAMUSCULAR | Status: AC | PRN
Start: 2022-10-17 — End: 2022-10-17
  Administered 2022-10-17: 3 mL

## 2022-10-17 MED ORDER — ACETAMINOPHEN 500 MG PO TABS
1000.0000 mg | ORAL_TABLET | Freq: Once | ORAL | Status: AC
  Administered 2022-10-17: 1000 mg via ORAL
  Filled 2022-10-17: qty 2

## 2022-10-17 MED ORDER — SODIUM CHLORIDE 0.9 % IV BOLUS: 1000.0000 mL | Freq: Once | INTRAVENOUS | Status: DC

## 2022-10-17 MED ORDER — METHYLPREDNISOLONE ACETATE 40 MG/ML IJ SUSP
40.0000 mg | INTRAMUSCULAR | Status: AC | PRN
Start: 2022-10-17 — End: 2022-10-17
  Administered 2022-10-17: 40 mg via INTRA_ARTICULAR

## 2022-10-17 MED ORDER — MOLNUPIRAVIR EUA 200MG CAPSULE
4.0000 | ORAL_CAPSULE | Freq: Two times a day (BID) | ORAL | 0 refills | Status: DC
  Filled 2022-10-17: qty 40, 5d supply, fill #0

## 2022-10-17 NOTE — Progress Notes (Signed)
   Procedure Note  Patient: Christian [name]             Date of Birth: [birth year]           MRN: [id number]             Visit Date: 10/17/2022  HPI: Christian [name] returns today for bilateral knee pain.  He has known arthritis of both knees.  He does want a prescription for a rollator.  He is currently on meloxicam which is helping take some Tylenol in addition to the meloxicam.  He is living at the [facility].  He reports he has gained some weight and is now 6 foot 1 and 408 pounds.  Review of systems: Negative for fevers chills or ongoing infection.  Nondiabetic.  Physical exam: Bilateral knees no abnormal warmth erythema or effusion.  Significant crepitus bilateral knees with range of motion.  Good range of motion of both knees.  Procedures: Visit Diagnoses:  1. Primary osteoarthritis of right knee   2. Primary osteoarthritis of left knee     Large Joint Inj: bilateral knee on 10/17/2022 9:56 AM Indications: pain Details: 22 G 1.5 in needle, anterolateral approach  Arthrogram: No  Medications (Right): 3 mL lidocaine 1 %; 40 mg methylPREDNISolone acetate 40 MG/ML Medications (Left): 3 mL lidocaine 1 %; 40 mg methylPREDNISolone acetate 40 MG/ML Outcome: tolerated well, no immediate complications Procedure, treatment alternatives, risks and benefits explained, specific risks discussed. Consent was given by the patient. Immediately prior to procedure a time out was called to verify the correct patient, procedure, equipment, support staff and site/side marked as required. Patient was prepped and draped in the usual sterile fashion.      Plan: He is given a prescription for rollator.  He will continue to work on weight loss and strengthening both knees reviewed quad strengthening with him.  Questions were encouraged and answered.  He knows to wait least 3 months between cortisone injections.

## 2022-10-17 NOTE — Discharge Instructions (Signed)
Return for any problem.  ?

## 2022-10-17 NOTE — ED Notes (Signed)
X-ray at bedside

## 2022-10-17 NOTE — ED Provider Notes (Signed)
[place] EMERGENCY DEPARTMENT AT [facility] Provider Note   CSN: [id number] Arrival date & time: 10/17/22  1608     History {Add pertinent medical, surgical, social history, OB history to HPI:1} Chief Complaint  Patient presents with   Shortness of Breath    Christian [name] is a 47 y.o. male.   Shortness of Breath      Home Medications Prior to Admission medications   Medication Sig Start Date End Date Taking? Authorizing Provider  molnupiravir EUA (LAGEVRIO) 200 mg CAPS capsule Take 4 capsules (800 mg total) by mouth 2 (two) times daily for 5 days. 10/17/22 10/22/22 Yes MessickNoralyn Pick, MD  albuterol (VENTOLIN HFA) 108 (90 Base) MCG/ACT inhaler Inhale 2 puffs into the lungs every 6 (six) hours as needed for wheezing or shortness of breath. 05/06/22   Parrett, Virgel Bouquet, NP  ciprofloxacin-dexamethasone (CIPRODEX) OTIC suspension Place 4 drops into the right ear 2 (two) times daily. 09/21/22   Storm Frisk, MD  fluticasone (FLONASE) 50 MCG/ACT nasal spray Spray 2 sprays into both nostrils once daily. 03/02/22   [name], MD  fluticasone-salmeterol (ADVAIR HFA) [phone] MCG/ACT inhaler Inhale 2 puffs into the lungs 2 (two) times daily. 08/31/22   Parrett, Virgel Bouquet, NP  meloxicam (MOBIC) 15 MG tablet Take 1 tablet (15 mg total) by mouth daily. 05/06/22   [name], MD  OXYGEN Inhale 2 L/min into the lungs at bedtime.    [provider]  sertraline (ZOLOFT) 100 MG tablet Take 1.5 tablets (150 mg total) by mouth daily. 10/14/22   Nwoko, Tommas Olp, PA  torsemide (DEMADEX) 20 MG tablet Take 2 tablets (40 mg total) by mouth 2 (two) times daily. 08/03/22 11/05/22  Zigmund Daniel., MD  traZODone (DESYREL) 50 MG tablet Take 1 tablet (50 mg total) by mouth at bedtime. 10/14/22   Nwoko, Tommas Olp, PA  vitamin B-12 (CYANOCOBALAMIN) 100 MCG tablet Take 1 tablet (100 mcg total) by mouth once daily. 12/24/20   Regalado, Prentiss Bells, MD      Allergies    Shellfish  allergy    Review of Systems   Review of Systems  Respiratory:  Positive for shortness of breath.     Physical Exam Updated Vital Signs BP (!) 140/71   Pulse 87   Temp 98.3 F (36.8 C)   Resp 16   SpO2 97%  Physical Exam  ED Results / Procedures / Treatments   Labs (all labs ordered are listed, but only abnormal results are displayed) Labs Reviewed  SARS CORONAVIRUS 2 BY RT PCR - Abnormal; Notable for the following components:      Result Value   SARS Coronavirus 2 by RT PCR POSITIVE (*)    All other components within normal limits  BASIC METABOLIC PANEL - Abnormal; Notable for the following components:   Glucose, Bld 197 (*)    All other components within normal limits  CBC WITH DIFFERENTIAL/PLATELET - Abnormal; Notable for the following components:   Lymphs Abs 0.5 (*)    All other components within normal limits  GROUP A STREP BY PCR    EKG EKG Interpretation Date/Time:  Monday October 17 2022 16:20:52 EDT Ventricular Rate:  87 PR Interval:  180 QRS Duration:  112 QT Interval:  388 QTC Calculation: 467 R Axis:   -43  Text Interpretation: Sinus rhythm Borderline IVCD with LAD Low voltage, precordial leads Abnormal R-wave progression, early transition Confirmed by Kristine Royal [id number]) on 10/17/2022 4:22:46 PM  Radiology DG Chest Port 1 View  Result Date: 10/17/2022 CLINICAL DATA:  Shortness of breath EXAM: PORTABLE CHEST 1 VIEW COMPARISON:  08/31/2022 FINDINGS: Apical lordotic projection. Scarring along the minor fissure. The lungs appear otherwise clear. No blunting of the costophrenic angles. Heart size within normal limits. IMPRESSION: 1. No acute findings. 2. Scarring along the minor fissure. Electronically Signed   By: Gaylyn Rong M.D.   On: 10/17/2022 18:35    Procedures Procedures  {Document cardiac monitor, telemetry assessment procedure when appropriate:1}  Medications Ordered in ED Medications  acetaminophen (TYLENOL) tablet 1,000 mg (1,000  mg Oral Given 10/17/22 1807)    ED Course/ Medical Decision Making/ A&P   {   Click here for ABCD2, HEART and other calculatorsREFRESH Note before signing :1}                              Medical Decision Making Amount and/or Complexity of Data Reviewed Labs: ordered. Radiology: ordered.  Risk OTC drugs.   ***  {Document critical care time when appropriate:1} {Document review of labs and clinical decision tools ie heart score, Chads2Vasc2 etc:1}  {Document your independent review of radiology images, and any outside records:1} {Document your discussion with family members, caretakers, and with consultants:1} {Document social determinants of health affecting pt's care:1} {Document your decision making why or why not admission, treatments were needed:1} Final Clinical Impression(s) / ED Diagnoses Final diagnoses:  COVID    Rx / DC Orders ED Discharge Orders          Ordered    molnupiravir EUA (LAGEVRIO) 200 mg CAPS capsule  2 times daily        10/17/22 1934

## 2022-10-17 NOTE — ED Triage Notes (Signed)
BIB [facility] due to onset of SHOB this morning, coming from shelter, afraid he may have Covid. Sore throat and cough with on and off chills. 138/88-80-95% RA

## 2022-10-18 ENCOUNTER — Other Ambulatory Visit (HOSPITAL_COMMUNITY): Payer: Self-pay

## 2022-10-18 ENCOUNTER — Other Ambulatory Visit: Payer: Self-pay

## 2022-10-18 ENCOUNTER — Ambulatory Visit: Payer: Self-pay

## 2022-10-18 NOTE — Congregational Nurse Program (Signed)
  Dept: [phone]   Congregational Nurse Program Note  Date of Encounter: 10/18/2022  Resident seen in ER yesterday with COVID-19 requested assistance with picking up medications from the pharmacy.  Has a non-productive cough, to start antiviral medicine ordered by ER physician. Past Medical History: Past Medical History:  Diagnosis Date   Allergic rhinitis    Anxiety    CHF (congestive heart failure) (HCC)    Depression    Depression    Elevated blood pressure reading without diagnosis of hypertension    History of chicken pox    Obesity    Pleural effusion    Sepsis (HCC)    Sleep apnea    O2 at night    Encounter Details:  CNP Questionnaire - 10/18/22 1130       Questionnaire   Ask client: Do you give verbal consent for me to treat you today? Yes    Student Assistance N/A    Location Patient Served  [facility]    Visit Setting with Client Organization    Patient Status Unhoused    Insurance Medicaid    Insurance/Financial Assistance Referral N/A    Medication Have Medication Insecurities;Provided Medication Assistance    Medical Provider Yes    Screening Referrals Made N/A    Medical Referrals Made N/A    Medical Appointment Made N/A    Recently w/o PCP, now 1st time PCP visit completed due to CNs referral or appointment made N/A    Food Have Food Insecurities    Transportation N/A    Housing/Utilities No permanent housing    Interpersonal Safety N/A    Interventions Advocate/Support;Reviewed Medications;Counsel;Case Management    Abnormal to Normal Screening Since Last CN Visit N/A    Screenings CN Performed N/A    Sent Client to Lab for: N/A    Did client attend any of the following based off CNs referral or appointments made? N/A    ED Visit Averted N/A    Life-Saving Intervention Made N/A

## 2022-10-18 NOTE — Telephone Encounter (Signed)
Chief Complaint: Medication Question  Symptoms: Covid+ Disposition: [] ED /[] Urgent Care (no appt availability in office) / [] Appointment(In office/virtual)/ []  [facility]/ [] Home Care/ [] Refused Recommended Disposition /[] [facility]/ [x]  Follow-up with PCP Additional Notes: Congregational Nurse Cathy called and stated that the patient was prescribed Molnupiravir 200mg  in the ED yesterday for covid symptoms but patient's insurance does not cover this medication and the Congregational Nurse can not cover the cost. Christian [name] is requesting the Rx be changed to Paxlovid and sent to [facility] pharmacy as soon as possible. I attempted to call the [facility] ED to speak with the charge nurse but no one answered the phone. I will forward message to PCP for additional recommendations.    Reason for Disposition  [1] Caller has medicine question about med NOT prescribed by PCP AND [2] triager unable to answer question (e.g., compatibility with other med, storage)  Answer Assessment - Initial Assessment Questions 1. NAME of MEDICINE: "What medicine(s) are you calling about?"     Molnupiravir EUA 200mg   2. QUESTION: "What is your question?" (e.g., double dose of medicine, side effect)     Insurance does not cover medication and the Congressional Nurse stated that can not cover the cost can Rx be changed to Paxlovid and sent to [facility] outpatient pharmacy  3. PRESCRIBER: "Who prescribed the medicine?" Reason: if prescribed by specialist, call should be referred to that group.     ED  4. SYMPTOMS: "Do you have any symptoms?" If Yes, ask: "What symptoms are you having?"  "How bad are the symptoms (e.g., mild, moderate, severe)     Covid symptoms  Protocols used: Medication Question Call-A-AH

## 2022-10-19 ENCOUNTER — Other Ambulatory Visit: Payer: Self-pay | Admitting: Critical Care Medicine

## 2022-10-19 ENCOUNTER — Telehealth: Payer: Self-pay | Admitting: *Deleted

## 2022-10-19 ENCOUNTER — Encounter: Payer: Self-pay | Admitting: *Deleted

## 2022-10-19 ENCOUNTER — Encounter: Payer: Self-pay | Admitting: Critical Care Medicine

## 2022-10-19 ENCOUNTER — Other Ambulatory Visit (HOSPITAL_COMMUNITY): Payer: Self-pay

## 2022-10-19 ENCOUNTER — Other Ambulatory Visit (HOSPITAL_COMMUNITY): Payer: Self-pay | Admitting: Emergency Medicine

## 2022-10-19 MED ORDER — NIRMATRELVIR/RITONAVIR (PAXLOVID)TABLET
ORAL_TABLET | ORAL | 0 refills | Status: DC
Start: 2022-10-19 — End: 2022-11-17
  Filled 2022-10-19: qty 30, 5d supply, fill #0

## 2022-10-19 NOTE — Congregational Nurse Program (Signed)
  Dept: [phone]   Congregational Nurse Program Note  Date of Encounter: 10/19/2022  Past Medical History: Past Medical History:  Diagnosis Date   Allergic rhinitis    Anxiety    CHF (congestive heart failure) (HCC)    Depression    Depression    Elevated blood pressure reading without diagnosis of hypertension    History of chicken pox    Obesity    Pleural effusion    Sepsis (HCC)    Sleep apnea    O2 at night    Encounter Details:  CNP Questionnaire - 10/19/22 1205       Questionnaire   Ask client: Do you give verbal consent for me to treat you today? Yes    Student Assistance N/A    Location Patient Served  [facility]    Visit Setting with Client Organization;Phone/Text/Email    Patient Status Unhoused    Insurance Medicaid    Insurance/Financial Assistance Referral N/A    Medication Have Medication Insecurities;Provided Medication Assistance    Medical Provider Yes    Screening Referrals Made N/A    Medical Referrals Made N/A    Medical Appointment Made N/A    Recently w/o PCP, now 1st time PCP visit completed due to CNs referral or appointment made N/A    Food Have Food Insecurities    Transportation N/A    Housing/Utilities No permanent housing    Interpersonal Safety N/A    Interventions Advocate/Support;Reviewed Medications;Case Management    Abnormal to Normal Screening Since Last CN Visit N/A    Screenings CN Performed N/A    Sent Client to Lab for: N/A    Did client attend any of the following based off CNs referral or appointments made? N/A    ED Visit Averted N/A    Life-Saving Intervention Made N/A            Contacted [facility] to check on medication for client. Per call, he had two medications at [facility] and neither were for covid. Contacted SW at [facility] ED and she had order called for client Paxlovid that medicaid would cover. Picked up medications at [facility] and brought to client at shelter. Reviewed instructions with client  related to Paxlovid.  Quy Lotts W RN CN

## 2022-10-19 NOTE — Progress Notes (Signed)
covid

## 2022-10-19 NOTE — Telephone Encounter (Signed)
Pt community RN Erskine Squibb) called regarding Rx not being covered by insurance.  RNCM relayed information to EDP and was advised to call in Rx covered by insurance (paxlovid).

## 2022-10-20 NOTE — Progress Notes (Signed)
Saw patient post ED visit  Has covid got paxlovid, has mild symptoms dyspnea  CXR normal in ED   Sats today 95%  appears well  Gave patient some fluids and will check again in AM

## 2022-10-21 ENCOUNTER — Other Ambulatory Visit (HOSPITAL_COMMUNITY): Payer: Self-pay

## 2022-10-24 NOTE — Telephone Encounter (Signed)
Patient has f/u on 11/03/2022

## 2022-10-27 ENCOUNTER — Other Ambulatory Visit: Payer: Self-pay | Admitting: Oncology

## 2022-10-27 DIAGNOSIS — Z006 Encounter for examination for normal comparison and control in clinical research program: Secondary | ICD-10-CM

## 2022-11-03 ENCOUNTER — Other Ambulatory Visit: Payer: Self-pay

## 2022-11-03 ENCOUNTER — Inpatient Hospital Stay: Payer: MEDICAID | Admitting: Family Medicine

## 2022-11-03 ENCOUNTER — Other Ambulatory Visit (HOSPITAL_COMMUNITY): Payer: Self-pay

## 2022-11-09 ENCOUNTER — Ambulatory Visit (INDEPENDENT_AMBULATORY_CARE_PROVIDER_SITE_OTHER): Payer: MEDICAID | Admitting: Licensed Clinical Social Worker

## 2022-11-09 DIAGNOSIS — F331 Major depressive disorder, recurrent, moderate: Secondary | ICD-10-CM | POA: Diagnosis not present

## 2022-11-09 DIAGNOSIS — F411 Generalized anxiety disorder: Secondary | ICD-10-CM | POA: Diagnosis not present

## 2022-11-09 NOTE — Progress Notes (Signed)
THERAPIST PROGRESS NOTE  Session Time: 30  Participation Level: Active  Behavioral Response: CasualAlertAnxious  Type of Therapy: Individual Therapy  Treatment Goals addressed:  Active     Anxiety     LTG: Christian [name] will score less than 5 on the Generalized Anxiety Disorder 7 Scale (GAD-7)  (Completed/Met)     Start:  05/11/22    Expected End:  10/28/22    Resolved:  08/17/22    Goal Note     Scpre taken today and last session scored below a 5 each time          STG: Christian [name] will complete at least 80% of assigned homework  (Progressing)     Start:  05/11/22    Expected End:  03/31/23         STG: Christian [name] will practice problem solving skills 3 times per week for the next 4 weeks.  (Progressing)     Start:  05/11/22    Expected End:  03/31/23         identify 3 trigger for anxiety  (Progressing)     Start:  05/11/22    Expected End:  03/31/23       Goal Note     Housing  Financials          create  3 coping skills for depression      Start:  05/11/22    Expected End:  03/31/23       Goal Note     Walking          Discuss risks and benefits of medication treatment options for this problem and prescribe as indicated     Start:  05/11/22         Encourage Christian [name] to take psychotropic medication(s) as prescribed     Start:  05/11/22         Work with Christian [name] to identify a minimum of 3 alternative coping behaviors to avoidance.      Start:  05/11/22         Create a weekly activity schedule     Start:  05/11/22         Review results of GAD-7 with Christian [name] to track progress (Completed)     Start:  05/11/22    End:  06/28/22    Intervention Note     Reviewed with patient during session.          Work with Christian [name] to track symptoms, triggers, and/or skill use through a mood chart, diary card, or journal (Completed)     Start:  05/11/22    End:  06/07/22      Perform psychoeducation regarding anxiety disorders (Completed)     Start:   05/11/22    End:  06/07/22      Provide Christian [name] with educational information and reading material on anxiety, its causes, and symptoms.  (Completed)     Start:  05/11/22    End:  06/07/22      Work with patient individually to identify the major components of a recent episode of anxiety: physical symptoms, major thoughts and images, and major behaviors they experienced (Completed)     Start:  05/11/22    End:  06/07/22      Work with Christian [name] to identify 3 personal goals for managing their anxiety to work on during current treatment.  (Completed)     Start:  05/11/22    End:  06/28/22      Work with Christian [name] to identify  a minimum of 3 consequences of avoidance.  (Completed)     Start:  05/11/22    End:  06/28/22        OP Depression     LTG: Reduce frequency, intensity, and duration of depression symptoms so that daily functioning is improved (Progressing)     Start:  05/11/22    Expected End:  03/31/23         LTG: Increase coping skills to manage depression and improve ability to perform daily activities (Progressing)     Start:  05/11/22    Expected End:  03/31/23         LTG: Christian [name] will score less than 10 on the Patient Health Questionnaire (PHQ-9)  (Completed/Met)     Start:  05/11/22    Expected End:  10/28/22    Resolved:  08/17/22    Goal Note     Scpre taken today and last session scored below a 5 each time          Work with Christian [name] to track symptoms, triggers, and/or skill use through a mood chart, diary card, or journal (Completed)     Start:  05/11/22    End:  06/07/22      Therapist will administer the PHQ-9 at weekly intervals for the next 8 weeks (Completed)     Start:  05/11/22    End:  06/28/22      Work with Christian [name] to identify the major components of a recent episode of depression: physical symptoms, major thoughts and images, and major behaviors they experienced (Completed)     Start:  05/11/22    End:  06/07/22         ProgressTowards  Goals: Progressing  Interventions: CBT, Motivational Interviewing, and Supportive  Summary: Christian [name] is a 47 y.o. male who presents with anxious mood\affect.  Patient was pleasant, cooperative, maintained good eye contact.  He engaged well in therapy session was dressed casually.  Patient reports primary stressors as housing, financials, and anxiety.  Patient endorses symptoms for tension, worry, and procrastination.  Christian [name] reports that he knows what he is supposed to be doing but sometimes procrastinates what he should be doing due to anxiety.  Patient reports that he continues to engage in individual counseling, medication management and community support groups through [facility].  Patient states that he continues to live in the shelter but has anxiety and that he will be discharged as he has lived there beyond the time allowed on his past.  Patient states that he would like to talk with the shelter staff but is concerned that it will remind him to get him out of the shelter.  Suicidal/Homicidal: Nowithout intent/plan  Therapist Response:     Interventions/Plan: LCSW supportive therapy for praise and encouragement.  LCSW went over worksheets for coping skills for anxiety examples of coping skills were progressive muscle relaxation, deep breathing, challenging irrational thoughts, and imagery.  LCSW also provided patient with worksheet to go over for "what is anxiety".  Plan: Return again in 3 weeks.  Diagnosis: MDD (major depressive disorder), recurrent episode, moderate (HCC)  GAD (generalized anxiety disorder)  Collaboration of Care: Other None today   Patient/Guardian was advised Release of Information must be obtained prior to any record release in order to collaborate their care with an outside provider. Patient/Guardian was advised if they have not already done so to contact the registration department to sign all necessary forms in order for [place] to release information  regarding their care.   Consent: Patient/Guardian gives verbal consent for treatment and assignment of benefits for services provided during this visit. Patient/Guardian expressed understanding and agreed to proceed.   Weber Cooks, LCSW 11/09/2022

## 2022-11-17 ENCOUNTER — Ambulatory Visit (INDEPENDENT_AMBULATORY_CARE_PROVIDER_SITE_OTHER): Payer: MEDICAID | Admitting: Family Medicine

## 2022-11-17 ENCOUNTER — Other Ambulatory Visit (HOSPITAL_BASED_OUTPATIENT_CLINIC_OR_DEPARTMENT_OTHER): Payer: Self-pay

## 2022-11-17 ENCOUNTER — Other Ambulatory Visit: Payer: Self-pay

## 2022-11-17 ENCOUNTER — Encounter: Payer: Self-pay | Admitting: Family Medicine

## 2022-11-17 VITALS — BP 131/85 | HR 72 | Temp 98.1°F | Resp 20 | Wt >= 6400 oz

## 2022-11-17 DIAGNOSIS — I5032 Chronic diastolic (congestive) heart failure: Secondary | ICD-10-CM | POA: Diagnosis not present

## 2022-11-17 DIAGNOSIS — Z23 Encounter for immunization: Secondary | ICD-10-CM | POA: Diagnosis not present

## 2022-11-17 DIAGNOSIS — I10 Essential (primary) hypertension: Secondary | ICD-10-CM

## 2022-11-17 DIAGNOSIS — R609 Edema, unspecified: Secondary | ICD-10-CM | POA: Diagnosis not present

## 2022-11-17 DIAGNOSIS — F32A Depression, unspecified: Secondary | ICD-10-CM

## 2022-11-17 DIAGNOSIS — Z5900 Homelessness unspecified: Secondary | ICD-10-CM

## 2022-11-17 DIAGNOSIS — Z6841 Body Mass Index (BMI) 40.0 and over, adult: Secondary | ICD-10-CM

## 2022-11-17 MED ORDER — MELOXICAM 15 MG PO TABS
15.0000 mg | ORAL_TABLET | Freq: Every day | ORAL | 2 refills | Status: DC
  Filled 2022-11-17: qty 30, 30d supply, fill #0
  Filled 2022-12-12: qty 30, 30d supply, fill #1
  Filled 2023-01-11 – 2023-01-13 (×2): qty 30, 30d supply, fill #2

## 2022-11-17 MED ORDER — TORSEMIDE 20 MG PO TABS
40.0000 mg | ORAL_TABLET | Freq: Two times a day (BID) | ORAL | 2 refills | Status: DC
  Filled 2022-11-17: qty 120, 30d supply, fill #0
  Filled 2022-12-12: qty 120, 30d supply, fill #1
  Filled 2023-01-11 – 2023-01-13 (×2): qty 120, 30d supply, fill #2

## 2022-11-18 ENCOUNTER — Inpatient Hospital Stay (HOSPITAL_COMMUNITY)
Admission: EM | Admit: 2022-11-18 | Discharge: 2022-11-28 | DRG: 177 | Disposition: A | Discharge status: Home / Self Care | Payer: MEDICAID | Attending: Internal Medicine | Admitting: Internal Medicine

## 2022-11-18 DIAGNOSIS — F419 Anxiety disorder, unspecified: Secondary | ICD-10-CM | POA: Diagnosis present

## 2022-11-18 DIAGNOSIS — Z807 Family history of other malignant neoplasms of lymphoid, hematopoietic and related tissues: Secondary | ICD-10-CM

## 2022-11-18 DIAGNOSIS — Z9981 Dependence on supplemental oxygen: Secondary | ICD-10-CM

## 2022-11-18 DIAGNOSIS — Z7951 Long term (current) use of inhaled steroids: Secondary | ICD-10-CM

## 2022-11-18 DIAGNOSIS — J690 Pneumonitis due to inhalation of food and vomit: Principal | ICD-10-CM | POA: Diagnosis present

## 2022-11-18 DIAGNOSIS — Z8249 Family history of ischemic heart disease and other diseases of the circulatory system: Secondary | ICD-10-CM

## 2022-11-18 DIAGNOSIS — T17428A Food in trachea causing other injury, initial encounter: Secondary | ICD-10-CM | POA: Diagnosis present

## 2022-11-18 DIAGNOSIS — Z791 Long term (current) use of non-steroidal anti-inflammatories (NSAID): Secondary | ICD-10-CM

## 2022-11-18 DIAGNOSIS — Z823 Family history of stroke: Secondary | ICD-10-CM

## 2022-11-18 DIAGNOSIS — E441 Mild protein-calorie malnutrition: Secondary | ICD-10-CM | POA: Diagnosis present

## 2022-11-18 DIAGNOSIS — R7303 Prediabetes: Secondary | ICD-10-CM | POA: Diagnosis present

## 2022-11-18 DIAGNOSIS — J9819 Other pulmonary collapse: Secondary | ICD-10-CM | POA: Diagnosis present

## 2022-11-18 DIAGNOSIS — R590 Localized enlarged lymph nodes: Secondary | ICD-10-CM | POA: Diagnosis present

## 2022-11-18 DIAGNOSIS — E66813 Obesity, class 3: Secondary | ICD-10-CM

## 2022-11-18 DIAGNOSIS — I10 Essential (primary) hypertension: Secondary | ICD-10-CM | POA: Diagnosis present

## 2022-11-18 DIAGNOSIS — J9601 Acute respiratory failure with hypoxia: Secondary | ICD-10-CM | POA: Diagnosis present

## 2022-11-18 DIAGNOSIS — E876 Hypokalemia: Secondary | ICD-10-CM | POA: Diagnosis not present

## 2022-11-18 DIAGNOSIS — Z59819 Housing instability, housed unspecified: Secondary | ICD-10-CM

## 2022-11-18 DIAGNOSIS — Z1152 Encounter for screening for COVID-19: Secondary | ICD-10-CM

## 2022-11-18 DIAGNOSIS — G4733 Obstructive sleep apnea (adult) (pediatric): Secondary | ICD-10-CM | POA: Diagnosis present

## 2022-11-18 DIAGNOSIS — F32A Depression, unspecified: Secondary | ICD-10-CM | POA: Diagnosis present

## 2022-11-18 DIAGNOSIS — Z79899 Other long term (current) drug therapy: Secondary | ICD-10-CM

## 2022-11-18 DIAGNOSIS — Z832 Family history of diseases of the blood and blood-forming organs and certain disorders involving the immune mechanism: Secondary | ICD-10-CM

## 2022-11-18 DIAGNOSIS — A419 Sepsis, unspecified organism: Secondary | ICD-10-CM

## 2022-11-18 DIAGNOSIS — J45909 Unspecified asthma, uncomplicated: Secondary | ICD-10-CM | POA: Diagnosis present

## 2022-11-18 DIAGNOSIS — Z800 Family history of malignant neoplasm of digestive organs: Secondary | ICD-10-CM

## 2022-11-18 DIAGNOSIS — Z820 Family history of epilepsy and other diseases of the nervous system: Secondary | ICD-10-CM

## 2022-11-18 DIAGNOSIS — I110 Hypertensive heart disease with heart failure: Secondary | ICD-10-CM | POA: Diagnosis present

## 2022-11-18 DIAGNOSIS — E662 Morbid (severe) obesity with alveolar hypoventilation: Secondary | ICD-10-CM | POA: Diagnosis present

## 2022-11-18 DIAGNOSIS — E8809 Other disorders of plasma-protein metabolism, not elsewhere classified: Secondary | ICD-10-CM | POA: Diagnosis present

## 2022-11-18 DIAGNOSIS — Z6841 Body Mass Index (BMI) 40.0 and over, adult: Secondary | ICD-10-CM

## 2022-11-18 DIAGNOSIS — Z83438 Family history of other disorder of lipoprotein metabolism and other lipidemia: Secondary | ICD-10-CM

## 2022-11-18 DIAGNOSIS — I5032 Chronic diastolic (congestive) heart failure: Secondary | ICD-10-CM | POA: Diagnosis present

## 2022-11-18 DIAGNOSIS — D649 Anemia, unspecified: Secondary | ICD-10-CM | POA: Diagnosis present

## 2022-11-18 DIAGNOSIS — Z833 Family history of diabetes mellitus: Secondary | ICD-10-CM

## 2022-11-18 NOTE — ED Triage Notes (Signed)
[facility],  pt had COVID a month ago and has had some SOB but pt believes he possibly aspirated while eating chicken wings.  Pt was initially 82% RA, currently 95% non re-breather.  174/84 256 cbg 110 pulse 18 l ac 12 lead sinus tach

## 2022-11-18 NOTE — ED Triage Notes (Incomplete)
[facility] for [facility],  pt had COVID a month ago and has had some SOB but pt believes he possibly aspirated  eating chicken wings.  82 RA 95 non rebreathe. 174/84 256 cbg 110 pulse 18 l ac 12 sinus tach

## 2022-11-19 ENCOUNTER — Encounter (HOSPITAL_COMMUNITY): Payer: Self-pay

## 2022-11-19 ENCOUNTER — Other Ambulatory Visit: Payer: Self-pay

## 2022-11-19 ENCOUNTER — Emergency Department (HOSPITAL_COMMUNITY): Payer: MEDICAID

## 2022-11-19 DIAGNOSIS — J9601 Acute respiratory failure with hypoxia: Secondary | ICD-10-CM | POA: Diagnosis present

## 2022-11-19 DIAGNOSIS — E662 Morbid (severe) obesity with alveolar hypoventilation: Secondary | ICD-10-CM | POA: Diagnosis present

## 2022-11-19 DIAGNOSIS — J4530 Mild persistent asthma, uncomplicated: Secondary | ICD-10-CM | POA: Diagnosis not present

## 2022-11-19 DIAGNOSIS — E441 Mild protein-calorie malnutrition: Secondary | ICD-10-CM | POA: Insufficient documentation

## 2022-11-19 DIAGNOSIS — Z8249 Family history of ischemic heart disease and other diseases of the circulatory system: Secondary | ICD-10-CM | POA: Diagnosis not present

## 2022-11-19 DIAGNOSIS — I5032 Chronic diastolic (congestive) heart failure: Secondary | ICD-10-CM | POA: Diagnosis present

## 2022-11-19 DIAGNOSIS — Z59819 Housing instability, housed unspecified: Secondary | ICD-10-CM | POA: Diagnosis not present

## 2022-11-19 DIAGNOSIS — Z7951 Long term (current) use of inhaled steroids: Secondary | ICD-10-CM | POA: Diagnosis not present

## 2022-11-19 DIAGNOSIS — R7303 Prediabetes: Secondary | ICD-10-CM | POA: Diagnosis present

## 2022-11-19 DIAGNOSIS — J9819 Other pulmonary collapse: Secondary | ICD-10-CM | POA: Diagnosis present

## 2022-11-19 DIAGNOSIS — G4733 Obstructive sleep apnea (adult) (pediatric): Secondary | ICD-10-CM | POA: Diagnosis present

## 2022-11-19 DIAGNOSIS — Z9981 Dependence on supplemental oxygen: Secondary | ICD-10-CM | POA: Diagnosis not present

## 2022-11-19 DIAGNOSIS — J690 Pneumonitis due to inhalation of food and vomit: Secondary | ICD-10-CM | POA: Diagnosis present

## 2022-11-19 DIAGNOSIS — D649 Anemia, unspecified: Secondary | ICD-10-CM | POA: Diagnosis present

## 2022-11-19 DIAGNOSIS — J45909 Unspecified asthma, uncomplicated: Secondary | ICD-10-CM | POA: Diagnosis present

## 2022-11-19 DIAGNOSIS — Z6841 Body Mass Index (BMI) 40.0 and over, adult: Secondary | ICD-10-CM | POA: Diagnosis not present

## 2022-11-19 DIAGNOSIS — E8809 Other disorders of plasma-protein metabolism, not elsewhere classified: Secondary | ICD-10-CM | POA: Diagnosis present

## 2022-11-19 DIAGNOSIS — T17428A Food in trachea causing other injury, initial encounter: Secondary | ICD-10-CM | POA: Diagnosis present

## 2022-11-19 DIAGNOSIS — Z800 Family history of malignant neoplasm of digestive organs: Secondary | ICD-10-CM | POA: Diagnosis not present

## 2022-11-19 DIAGNOSIS — I110 Hypertensive heart disease with heart failure: Secondary | ICD-10-CM | POA: Diagnosis present

## 2022-11-19 DIAGNOSIS — F32A Depression, unspecified: Secondary | ICD-10-CM | POA: Diagnosis present

## 2022-11-19 DIAGNOSIS — E876 Hypokalemia: Secondary | ICD-10-CM | POA: Diagnosis not present

## 2022-11-19 DIAGNOSIS — Z791 Long term (current) use of non-steroidal anti-inflammatories (NSAID): Secondary | ICD-10-CM | POA: Diagnosis not present

## 2022-11-19 DIAGNOSIS — Z79899 Other long term (current) drug therapy: Secondary | ICD-10-CM | POA: Diagnosis not present

## 2022-11-19 DIAGNOSIS — Z1152 Encounter for screening for COVID-19: Secondary | ICD-10-CM | POA: Diagnosis not present

## 2022-11-19 LAB — APTT: aPTT: 31 seconds (ref 24–36)

## 2022-11-19 LAB — CBC
HCT: 41.8 % (ref 39.0–52.0)
Hemoglobin: 13.3 g/dL (ref 13.0–17.0)
MCH: 26.3 pg (ref 26.0–34.0)
MCHC: 31.8 g/dL (ref 30.0–36.0)
MCV: 82.6 fL (ref 80.0–100.0)
Platelets: 306 10*3/uL (ref 150–400)
RBC: 5.06 MIL/uL (ref 4.22–5.81)
RDW: 14 % (ref 11.5–15.5)
WBC: 11.2 10*3/uL — ABNORMAL HIGH (ref 4.0–10.5)
nRBC: 0 % (ref 0.0–0.2)

## 2022-11-19 LAB — URINALYSIS, W/ REFLEX TO CULTURE (INFECTION SUSPECTED)
Bacteria, UA: NONE SEEN
Bilirubin Urine: NEGATIVE
Glucose, UA: NEGATIVE mg/dL
Hgb urine dipstick: NEGATIVE
Ketones, ur: NEGATIVE mg/dL
Leukocytes,Ua: NEGATIVE
Nitrite: NEGATIVE
Protein, ur: NEGATIVE mg/dL
Specific Gravity, Urine: 1.015 (ref 1.005–1.030)
pH: 6 (ref 5.0–8.0)

## 2022-11-19 LAB — HEMOGLOBIN A1C
Hgb A1c MFr Bld: 5.8 % — ABNORMAL HIGH (ref 4.8–5.6)
Mean Plasma Glucose: 119.76 mg/dL

## 2022-11-19 LAB — COMPREHENSIVE METABOLIC PANEL
ALT: 14 U/L (ref 0–44)
AST: 14 U/L — ABNORMAL LOW (ref 15–41)
Albumin: 3.2 g/dL — ABNORMAL LOW (ref 3.5–5.0)
Alkaline Phosphatase: 75 U/L (ref 38–126)
Anion gap: 7 (ref 5–15)
BUN: 20 mg/dL (ref 6–20)
CO2: 25 mmol/L (ref 22–32)
Calcium: 8.1 mg/dL — ABNORMAL LOW (ref 8.9–10.3)
Chloride: 106 mmol/L (ref 98–111)
Creatinine, Ser: 0.98 mg/dL (ref 0.61–1.24)
GFR, Estimated: >60 mL/min (ref >60)
Glucose, Bld: 142 mg/dL — ABNORMAL HIGH (ref 70–99)
Potassium: 4 mmol/L (ref 3.5–5.1)
Sodium: 138 mmol/L (ref 135–145)
Total Bilirubin: 0.8 mg/dL (ref 0.3–1.2)
Total Protein: 5.8 g/dL — ABNORMAL LOW (ref 6.5–8.1)

## 2022-11-19 LAB — PROTIME-INR
INR: 1.1 (ref 0.8–1.2)
Prothrombin Time: 14.5 seconds (ref 11.4–15.2)

## 2022-11-19 LAB — RESP PANEL BY RT-PCR (RSV, FLU A&B, COVID)  RVPGX2
Influenza A by PCR: NEGATIVE
Influenza B by PCR: NEGATIVE
Resp Syncytial Virus by PCR: NEGATIVE
SARS Coronavirus 2 by RT PCR: NEGATIVE

## 2022-11-19 LAB — GLUCOSE, CAPILLARY
Glucose-Capillary: 150 mg/dL — ABNORMAL HIGH (ref 70–99)
Glucose-Capillary: 107 mg/dL — ABNORMAL HIGH (ref 70–99)

## 2022-11-19 LAB — TROPONIN I (HIGH SENSITIVITY)
Troponin I (High Sensitivity): 15 ng/L (ref <18)
Troponin I (High Sensitivity): 13 ng/L (ref <18)

## 2022-11-19 LAB — CBG MONITORING, ED: Glucose-Capillary: 87 mg/dL (ref 70–99)

## 2022-11-19 LAB — BRAIN NATRIURETIC PEPTIDE: B Natriuretic Peptide: 67.9 pg/mL (ref 0.0–100.0)

## 2022-11-19 MED ORDER — INSULIN ASPART 100 UNIT/ML IJ SOLN
0.0000 [IU] | Freq: Three times a day (TID) | INTRAMUSCULAR | Status: DC
  Administered 2022-11-19 – 2022-11-27 (×5): 3 [IU] via SUBCUTANEOUS
  Filled 2022-11-19: qty 0.2

## 2022-11-19 MED ORDER — ACETAMINOPHEN 325 MG PO TABS
650.0000 mg | ORAL_TABLET | Freq: Four times a day (QID) | ORAL | Status: DC | PRN
  Administered 2022-11-23 (×2): 650 mg via ORAL
  Filled 2022-11-19 (×3): qty 2

## 2022-11-19 MED ORDER — ACETAMINOPHEN 650 MG RE SUPP: 650.0000 mg | Freq: Four times a day (QID) | RECTAL | Status: DC | PRN

## 2022-11-19 MED ORDER — LACTATED RINGERS IV BOLUS: 2000.0000 mL | Freq: Once | INTRAVENOUS | Status: DC

## 2022-11-19 MED ORDER — TRAZODONE HCL 100 MG PO TABS
100.0000 mg | ORAL_TABLET | Freq: Every evening | ORAL | Status: DC | PRN
  Administered 2022-11-19 – 2022-11-27 (×9): 100 mg via ORAL
  Filled 2022-11-19 (×9): qty 1

## 2022-11-19 MED ORDER — SERTRALINE HCL 50 MG PO TABS
150.0000 mg | ORAL_TABLET | Freq: Every day | ORAL | Status: DC
  Administered 2022-11-19 – 2022-11-28 (×10): 150 mg via ORAL
  Filled 2022-11-19 (×3): qty 1
  Filled 2022-11-19: qty 3
  Filled 2022-11-19 (×6): qty 1

## 2022-11-19 MED ORDER — SODIUM CHLORIDE 0.9 % IV SOLN
2.0000 g | Freq: Once | INTRAVENOUS | Status: AC
  Administered 2022-11-19: 2 g via INTRAVENOUS
  Filled 2022-11-19: qty 20

## 2022-11-19 MED ORDER — ONDANSETRON HCL 4 MG/2ML IJ SOLN: 4.0000 mg | Freq: Four times a day (QID) | INTRAMUSCULAR | Status: DC | PRN

## 2022-11-19 MED ORDER — IPRATROPIUM-ALBUTEROL 0.5-2.5 (3) MG/3ML IN SOLN
3.0000 mL | Freq: Four times a day (QID) | RESPIRATORY_TRACT | Status: DC
  Administered 2022-11-19 – 2022-11-23 (×16): 3 mL via RESPIRATORY_TRACT
  Filled 2022-11-19 (×16): qty 3

## 2022-11-19 MED ORDER — POTASSIUM CHLORIDE CRYS ER 20 MEQ PO TBCR
40.0000 meq | EXTENDED_RELEASE_TABLET | Freq: Every day | ORAL | Status: AC
  Administered 2022-11-20: 40 meq via ORAL
  Filled 2022-11-19: qty 2

## 2022-11-19 MED ORDER — ACETAMINOPHEN 325 MG PO TABS
650.0000 mg | ORAL_TABLET | Freq: Once | ORAL | Status: AC
  Administered 2022-11-19: 650 mg via ORAL
  Filled 2022-11-19: qty 2

## 2022-11-19 MED ORDER — METRONIDAZOLE 500 MG/100ML IV SOLN
500.0000 mg | Freq: Once | INTRAVENOUS | Status: AC
  Administered 2022-11-19: 500 mg via INTRAVENOUS
  Filled 2022-11-19: qty 100

## 2022-11-19 MED ORDER — TORSEMIDE 20 MG PO TABS: 40.0000 mg | ORAL_TABLET | Freq: Every day | ORAL | Status: DC

## 2022-11-19 MED ORDER — IOHEXOL 350 MG/ML SOLN
75.0000 mL | Freq: Once | INTRAVENOUS | Status: AC | PRN
  Administered 2022-11-19: 75 mL via INTRAVENOUS

## 2022-11-19 MED ORDER — SODIUM CHLORIDE 0.9 % IV SOLN
3.0000 g | Freq: Four times a day (QID) | INTRAVENOUS | Status: DC
  Administered 2022-11-19 – 2022-11-26 (×28): 3 g via INTRAVENOUS
  Filled 2022-11-19 (×30): qty 8

## 2022-11-19 MED ORDER — LACTATED RINGERS IV BOLUS
2500.0000 mL | Freq: Once | INTRAVENOUS | Status: AC
  Administered 2022-11-19: 2500 mL via INTRAVENOUS

## 2022-11-19 MED ORDER — IPRATROPIUM-ALBUTEROL 0.5-2.5 (3) MG/3ML IN SOLN: 3.0000 mL | RESPIRATORY_TRACT | Status: DC | PRN

## 2022-11-19 MED ORDER — TORSEMIDE 20 MG PO TABS
40.0000 mg | ORAL_TABLET | Freq: Two times a day (BID) | ORAL | Status: DC
  Administered 2022-11-20 – 2022-11-28 (×17): 40 mg via ORAL
  Filled 2022-11-19 (×19): qty 2

## 2022-11-19 NOTE — Progress Notes (Signed)
Pharmacy Antibiotic Note  Christian [name] is a 47 y.o. male who choked on chicken wing on 11/18/22 requiring the Heimlich maneuver and then presented to the ED on 11/18/2022 with c/o SOB.  Chest CT was negative for PE but showed "moderate to marked severity multifocal infiltrates throughout the right upper lobe, right middle lobe and right lower lobe." Pharmacy has been consulted to dose unasyn for aspiration PNA.  - ceftriaxone 2gm and flagyl 500 mg x1 given on 9/21 at ~4a  Plan: - unasyn 3gm IV q6h - With good renal function, pharmacy will sign off for abx consult.  Reconsult [place] if need further assistance.  _______________________________________________  Temp (24hrs), Avg:97.5 F (36.4 C), Min:97.5 F (36.4 C), Max:97.5 F (36.4 C)  Recent Labs  Lab 11/19/22 0055  WBC 11.2*  CREATININE 0.98    Estimated Creatinine Clearance: 159.1 mL/min (by C-G formula based on SCr of 0.98 mg/dL).    Allergies  Allergen Reactions   Shellfish Allergy Nausea Only    Thank you for allowing pharmacy to be a part of this patient's care.  Lucia Gaskins 11/19/2022 6:05 AM

## 2022-11-19 NOTE — Plan of Care (Signed)

## 2022-11-19 NOTE — ED Provider Notes (Signed)
[place] EMERGENCY DEPARTMENT AT [facility] Provider Note   CSN: [id number] Arrival date & time: 11/18/22  2348     History  Chief Complaint  Patient presents with   Shortness of Breath    Christian [name] is a 47 y.o. male.  HPI   Patient with medical history including depression, sleep apnea, diastolic heart failure, presenting with complaints of shortness of breath.  Patient states that [facility] he was at the shelter he was eating a chicken bone began to choke, someone perform the Heimlich maneuver, he is able to spit it out.  He states that he was feeling fine, states he went to bed though felt like he could not breathe, states she kept coughing, he started develop fevers and chills, denies any chest pain or shortness of breath, no history of PEs or DVTs, no history of MIs, no recent surgeries no long immobilizations.  Patient is concerned that he might of aspirated.  Home Medications Prior to Admission medications   Medication Sig Start Date End Date Taking? Authorizing Provider  albuterol (VENTOLIN HFA) 108 (90 Base) MCG/ACT inhaler Inhale 2 puffs into the lungs every 6 (six) hours as needed for wheezing or shortness of breath. 05/06/22  Yes Parrett, Tammy S, NP  fluticasone (FLONASE) 50 MCG/ACT nasal spray Spray 2 sprays into both nostrils once daily. Patient taking differently: Place 2 sprays into both nostrils daily as needed for allergies. 03/02/22  Yes [name], MD  fluticasone-salmeterol (ADVAIR HFA) [id number] MCG/ACT inhaler Inhale 2 puffs into the lungs 2 (two) times daily. 08/31/22  Yes Parrett, Tammy S, NP  meloxicam (MOBIC) 15 MG tablet Take 1 tablet (15 mg total) by mouth daily. 11/17/22  Yes [name], MD  sertraline (ZOLOFT) 100 MG tablet Take 1 and 1/2 tablet (150 mg total) by mouth daily. 10/14/22  Yes Nwoko, Tommas Olp, PA  torsemide (DEMADEX) 20 MG tablet Take 2 tablets (40 mg total) by mouth 2 (two) times daily. 11/17/22 12/17/22 Yes [name], MD   traZODone (DESYREL) 50 MG tablet Take 1 tablet (50 mg total) by mouth at bedtime. 10/14/22  Yes Nwoko, Uchenna E, PA  OXYGEN Inhale 2 L/min into the lungs at bedtime. Patient not taking: Reported on 11/19/2022    [provider]  vitamin B-12 (CYANOCOBALAMIN) 100 MCG tablet Take 1 tablet (100 mcg total) by mouth once daily. Patient not taking: Reported on 11/19/2022 12/24/20   [name], MD      Allergies    Shellfish allergy    Review of Systems   Review of Systems  Constitutional:  Negative for chills and fever.  Respiratory:  Positive for cough and shortness of breath.   Cardiovascular:  Negative for chest pain.  Gastrointestinal:  Negative for abdominal pain.  Neurological:  Negative for headaches.    Physical Exam Updated Vital Signs BP 113/64   Pulse 93   Temp (!) 97.5 F (36.4 C) (Oral)   Resp 17   SpO2 91%  Physical Exam Vitals and nursing note reviewed.  Constitutional:      General: He is not in acute distress.    Appearance: He is not ill-appearing.  HENT:     Head: Normocephalic and atraumatic.     Nose: No congestion.  Eyes:     Conjunctiva/sclera: Conjunctivae normal.  Neck:     Comments: No JVD Cardiovascular:     Rate and Rhythm: Regular rhythm. Tachycardia present.     Pulses: Normal pulses.  Heart sounds: No murmur heard.    No friction rub. No gallop.  Pulmonary:     Effort: No respiratory distress.     Breath sounds: Rhonchi present. No wheezing or rales.     Comments: My exam patient is tachypneic, requiring 3 L via nasal cannula, no accessory muscle usage, patient has coarse sounding lungs, with noted rhonchi main in the right lower lobe, no wheezing rales present. Musculoskeletal:     Right lower leg: No edema.     Left lower leg: No edema.     Comments: No unilateral leg swelling no calf tenderness no palpable cords.  Skin:    General: Skin is warm and dry.  Neurological:     Mental Status: He is alert.  Psychiatric:         Mood and Affect: Mood normal.     ED Results / Procedures / Treatments   Labs (all labs ordered are listed, but only abnormal results are displayed) Labs Reviewed  COMPREHENSIVE METABOLIC PANEL - Abnormal; Notable for the following components:      Result Value   Glucose, Bld 142 (*)    Calcium 8.1 (*)    Total Protein 5.8 (*)    Albumin 3.2 (*)    AST 14 (*)    All other components within normal limits  CBC - Abnormal; Notable for the following components:   WBC 11.2 (*)    All other components within normal limits  RESP PANEL BY RT-PCR (RSV, FLU A&B, COVID)  RVPGX2  CULTURE, BLOOD (ROUTINE X 2)  CULTURE, BLOOD (ROUTINE X 2)  PROTIME-INR  APTT  URINALYSIS, W/ REFLEX TO CULTURE (INFECTION SUSPECTED)  BRAIN NATRIURETIC PEPTIDE  I-STAT CG4 LACTIC ACID, ED  I-STAT CG4 LACTIC ACID, ED  TROPONIN I (HIGH SENSITIVITY)  TROPONIN I (HIGH SENSITIVITY)    EKG EKG Interpretation Date/Time:  Saturday November 19 2022 00:06:16 EDT Ventricular Rate:  110 PR Interval:  152 QRS Duration:  99 QT Interval:  312 QTC Calculation: 422 R Axis:   -56  Text Interpretation: Sinus tachycardia Left anterior fascicular block Low voltage, precordial leads RSR' in V1 or V2, probably normal variant Artifact in lead(s) I II aVR aVL V1 When compared with ECG of 10/17/2022, HEART RATE has increased Confirmed by Dione Booze ([id number]) on 11/19/2022 2:43:47 AM  Radiology CT Angio Chest PE W and/or Wo Contrast  Result Date: 11/19/2022 CLINICAL DATA:  Shortness of breath with possible aspiration wall eating chicken wings. EXAM: CT ANGIOGRAPHY CHEST WITH CONTRAST TECHNIQUE: Multidetector CT imaging of the chest was performed using the standard protocol during bolus administration of intravenous contrast. Multiplanar CT image reconstructions and MIPs were obtained to evaluate the vascular anatomy. RADIATION DOSE REDUCTION: This exam was performed according to the departmental dose-optimization program  which includes automated exposure control, adjustment of the mA and/or kV according to patient size and/or use of iterative reconstruction technique. CONTRAST:  75mL OMNIPAQUE IOHEXOL 350 MG/ML SOLN COMPARISON:  June 14, 2022 FINDINGS: Cardiovascular: The thoracic aorta is normal in appearance. Satisfactory opacification of the pulmonary arteries to the segmental level. No evidence of pulmonary embolism. Normal heart size. No pericardial effusion. Mediastinum/Nodes: There is mild paratracheal, right hilar and subcarinal lymphadenopathy. Thyroid gland, trachea, and esophagus demonstrate no significant findings. Lungs/Pleura: Moderate to marked severity multifocal infiltrates are seen throughout the right upper lobe, right middle lobe and right lower lobe. Mild left upper lobe and left lower lobe multifocal infiltrates are also noted. Mild right-sided volume loss is seen.  There is a very small right pleural effusion. No pneumothorax is identified. Upper Abdomen: A 3.5 cm diameter simple cyst is seen within the anterolateral aspect of the mid left kidney. Musculoskeletal: No chest wall abnormality. No acute or significant osseous findings. Review of the MIP images confirms the above findings. IMPRESSION: 1. No evidence of pulmonary embolism. 2. Moderate to marked severity multifocal infiltrates throughout the right upper lobe, right middle lobe and right lower lobe. 3. Mild left upper lobe and left lower lobe multifocal infiltrates. 4. Very small right pleural effusion. 5. Mild mediastinal and right hilar lymphadenopathy. 6. Simple left renal cyst. No follow-up imaging is recommended. This recommendation follows ACR consensus guidelines: Management of the Incidental Renal Mass on CT: A White Paper of the ACR Incidental Findings Committee. J Am Coll Radiol [phone]. Electronically Signed   By: Aram Candela M.D.   On: 11/19/2022 03:24   DG Chest Port 1 View  Result Date: 11/19/2022 CLINICAL DATA:   Shortness of breath and possible aspiration while eating. EXAM: PORTABLE CHEST 1 VIEW COMPARISON:  October 17, 2022 FINDINGS: The cardiac silhouette is mildly enlarged which may be, in part, technical in origin. Mild to moderate severity infiltrate is seen within the mid right lung and right lung base. Right-sided volume loss is also noted. Mild elevation of the right hemidiaphragm is again seen. No pleural effusion or pneumothorax is identified. The visualized skeletal structures are unremarkable. IMPRESSION: Mild to moderate severity right-sided infiltrate. Electronically Signed   By: Aram Candela M.D.   On: 11/19/2022 01:54    Procedures .Critical Care  Performed by: Carroll Sage, PA-C Authorized by: Carroll Sage, PA-C   Critical care provider statement:    Critical care time (minutes):  30   Critical care time was exclusive of:  Separately billable procedures and treating other patients   Critical care was necessary to treat or prevent imminent or life-threatening deterioration of the following conditions:  Respiratory failure and sepsis   Critical care was time spent personally by me on the following activities:  Development of treatment plan with patient or surrogate, discussions with consultants, evaluation of patient's response to treatment, examination of patient, ordering and review of laboratory studies, ordering and review of radiographic studies, ordering and performing treatments and interventions, pulse oximetry, re-evaluation of patient's condition and review of old charts   I assumed direction of critical care for this patient from another provider in my specialty: no     Care discussed with: admitting provider       Medications Ordered in ED Medications  acetaminophen (TYLENOL) tablet 650 mg (650 mg Oral Given 11/19/22 0325)  cefTRIAXone (ROCEPHIN) 2 g in sodium chloride 0.9 % 100 mL IVPB (0 g Intravenous Stopped 11/19/22 0353)  iohexol (OMNIPAQUE) 350 MG/ML  injection 75 mL (75 mLs Intravenous Contrast Given 11/19/22 0249)  metroNIDAZOLE (FLAGYL) IVPB 500 mg (0 mg Intravenous Stopped 11/19/22 0526)  lactated ringers bolus 2,500 mL (2,500 mLs Intravenous New Bag/Given 11/19/22 0525)    ED Course/ Medical Decision Making/ A&P                                 Medical Decision Making Amount and/or Complexity of Data Reviewed Labs: ordered. Radiology: ordered.  Risk OTC drugs. Prescription drug management. Decision regarding hospitalization.   This patient presents to the ED for concern of shortness of breath, this involves an extensive number of treatment options, and  is a complaint that carries with it a high risk of complications and morbidity.  The differential diagnosis includes ACS, PE, CHF, dissection, sepsis, pneumonia,    Additional history obtained:  Additional history obtained from N/A External records from outside source obtained and reviewed including cardiology notes   Co morbidities that complicate the patient evaluation  CHF,  Social Determinants of Health:  Housing instability    Lab Tests:  I Ordered, and personally interpreted labs.  The pertinent results include: CBC shows leukocytosis 11.2, CMP reveals glucose of 142, calcium 8.1, total protein 5.8, albumin 3.2 AST 14, UA is unremarkable, respiratory panel negative, prothrombin time INR unremarkable, for troponin is 15, APTT unremarkable, BNP is 67.   Imaging Studies ordered:  I ordered imaging studies including chest x-ray, CTA of chest  I independently visualized and interpreted imaging which showed chest x-ray reveals infiltrates in the right lung base, CT a of chest reveals no PE but shows moderate to marked severity multifocal pneumonia throughout the right upper lobe middle lobe and lower lobe mild upper lobe and left lower lobe multifocal pneumonia small pleural effusion. I agree with the radiologist interpretation   Cardiac Monitoring:  The  patient was maintained on a cardiac monitor.  I personally viewed and interpreted the cardiac monitored which showed an underlying rhythm of: EKG without signs of ischemia   Medicines ordered and prescription drug management:  I ordered medication including fluids I have reviewed the patients home medicines and have made adjustments as needed  Critical Interventions:  Respiratory failure-on 3 L via nasal cannula with O2 sats remaining within the low 90s Sepsis-currently receiving antibiotics for aspiration pneumonia, receiving fluid resuscitation, is improving with current treatment at this time.   Reevaluation:  Presents with shortness of breath, triage obtain basic lab workup imaging which I personally reviewed, concerning for right lobe pneumonia, on my exam he is tachycardic, with an oral temperature of 100.4, will obtain sepsis workup, start antibiotics and fluids send down for CT of chest for possible PE  CT scan reveals multifocal pneumonia, will add on Flagyl for coverage of aspiration pneumonia, recommend admission for further treatment of pneumonia patient agreed this plan will consult with hospitalist team.  Consultations Obtained:  I requested consultation with the Dr. Dalene Carrow ,  and discussed lab and imaging findings as well as pertinent plan - they recommend:  he will admit the patient    Test Considered:  N/a    Rule out I have low suspicion for ACS as history is atypical, patient has no cardiac history, EKG was sinus rhythm without signs of ischemia, patient has a negative troponin.  Low suspicion for PE CTA is negative suspicion for CHF exacerbation low at this time he is not volume overloaded my exam BNP is unremarkable there is no pleural effusions or congestion seen on imaging.  Low suspicion for AAA or aortic dissection as history is atypical, patient has low risk factors.  Low suspicion for systemic infection as patient is nontoxic-appearing, vital signs  reassuring, no obvious source infection noted on exam.     Dispostion and problem list  After consideration of the diagnostic results and the patients response to treatment, I feel that the patent would benefit from admission.  Aspiration pneumonia-currently on antibiotics, patiently continue monitoring. Respiratory failure-secondary due to pneumonia, currently on supplemental oxygen, patient will need current evaluation and weaning from supplemental oxygen.            Final Clinical Impression(s) / ED Diagnoses  Final diagnoses:  Aspiration pneumonia of right lower lobe, unspecified aspiration pneumonia type (HCC)  Acute respiratory failure with hypoxia (HCC)  Sepsis, due to unspecified organism, unspecified whether acute organ dysfunction present [facility])    Rx / DC Orders ED Discharge Orders     None         Carroll Sage, PA-C 11/19/22 0544    Dione Booze, MD 11/19/22 [phone]

## 2022-11-19 NOTE — H&P (Signed)
History and Physical    Patient: Christian [name] ZYS:[id number] DOB: 1975/04/24 DOA: 11/18/2022 DOS: the patient was seen and examined on 11/19/2022 PCP: [name], MD  Patient coming from: Home  Chief Complaint:  Chief Complaint  Patient presents with   Shortness of Breath   HPI: Christian [name] is a 47 y.o. male with medical history significant of allergic rhinitis, anxiety, chronic diastolic CHF, depression, herpes zoster, obesity, pleural effusion, sleep apnea who presented to the emergency department with complaints of dyspnea after he choked with a piece of chicken requiring the Heimlich maneuver.  He initially require NRB mask at 15 L/min but is now down to 4 LPM.  He has had a previous episode of aspiration in the past. He had wheezing and productive cough afterwards.  Denied fever, chills, rhinorrhea, sore throat or hemoptysis.  No chest pain, palpitations, diaphoresis, PND, orthopnea or but has frequent pitting edema of the lower extremities.  No abdominal pain, nausea, emesis, diarrhea, constipation, melena or hematochezia.  No flank pain, dysuria, frequency or hematuria.  No polyuria, polydipsia, polyphagia or blurred vision.   Lab work: Urinalysis was normal.  Negative coronavirus, influenza and RSV PCR.  Normal PT, INR and PTT.  CBCs are white count 11.2, hemoglobin 13.3 g/dL and platelets 932.  Troponin x 2 and BNP were normal.  Imaging: Portable 1 view chest radiograph with mild to moderate severity right-sided infiltrate.  CTA chest no evidence of PE.  There is moderate to marked severity multifocal infiltrates throughout the right upper lobe, right middle lobe and right lower lobe.  Mild left upper lobe and left lower lobe multifocal infiltrates.  Very small right pleural effusion.  Mild mediastinal and right hilar lymphadenopathy.  Simple left renal cysts with no follow-up recommended.   ED course: Initial vital signs were temperature 97.5 F, pulse 110, respirations 24,  BP 142/99 mmHg O2 sat 92% on nonrebreather mask.  The patient received acetaminophen 650 mg p.o., ceftriaxone 2 g IVPB, metronidazole 500 mg IVPB and 2500 mL LR bolus.  Review of Systems: As mentioned in the history of present illness. All other systems reviewed and are negative. Past Medical History:  Diagnosis Date   Allergic rhinitis    Anxiety    CHF (congestive heart failure) (HCC)    Depression    Depression    Elevated blood pressure reading without diagnosis of hypertension    History of chicken pox    Obesity    Pleural effusion    Sepsis (HCC)    Sleep apnea    O2 at night   Past Surgical History:  Procedure Laterality Date   COLON SURGERY     perforated bowel   TONSILLECTOMY AND ADENOIDECTOMY  03/01/1983   Social History:  reports that he has never smoked. He has never used smokeless tobacco. He reports current alcohol use of about 2.0 standard drinks of alcohol per week. He reports that he does not use drugs.  Allergies  Allergen Reactions   Shellfish Allergy Nausea Only    Family History  Problem Relation Age of Onset   Stroke Mother    Diabetes Mother        pre-diabetes   Heart disease Mother        CHF, pacer   Cancer Mother        lymphoma   Stomach cancer Mother    Clotting disorder Mother    Coronary artery disease Father 16       MI  Hypertension Father    Hyperlipidemia Father    Diabetes Father    Heart attack Father    Migraines Sister    Stroke Paternal Grandmother    Diabetes Paternal Grandmother    Heart disease Paternal Grandfather    Parkinson's disease Paternal Grandfather    Heart attack Paternal Grandfather     Prior to Admission medications   Medication Sig Start Date End Date Taking? Authorizing Provider  albuterol (VENTOLIN HFA) 108 (90 Base) MCG/ACT inhaler Inhale 2 puffs into the lungs every 6 (six) hours as needed for wheezing or shortness of breath. 05/06/22  Yes Parrett, Tammy S, NP  fluticasone (FLONASE) 50 MCG/ACT  nasal spray Spray 2 sprays into both nostrils once daily. Patient taking differently: Place 2 sprays into both nostrils daily as needed for allergies. 03/02/22  Yes [name], MD  fluticasone-salmeterol (ADVAIR HFA) [id number] MCG/ACT inhaler Inhale 2 puffs into the lungs 2 (two) times daily. 08/31/22  Yes Parrett, Tammy S, NP  meloxicam (MOBIC) 15 MG tablet Take 1 tablet (15 mg total) by mouth daily. 11/17/22  Yes [name], MD  sertraline (ZOLOFT) 100 MG tablet Take 1 and 1/2 tablet (150 mg total) by mouth daily. 10/14/22  Yes Nwoko, Tommas Olp, PA  torsemide (DEMADEX) 20 MG tablet Take 2 tablets (40 mg total) by mouth 2 (two) times daily. 11/17/22 12/17/22 Yes [name], MD  traZODone (DESYREL) 50 MG tablet Take 1 tablet (50 mg total) by mouth at bedtime. 10/14/22  Yes Nwoko, Uchenna E, PA  OXYGEN Inhale 2 L/min into the lungs at bedtime. Patient not taking: Reported on 11/19/2022    [provider]  vitamin B-12 (CYANOCOBALAMIN) 100 MCG tablet Take 1 tablet (100 mcg total) by mouth once daily. Patient not taking: Reported on 11/19/2022 12/24/20   [name], MD    Physical Exam: Vitals:   11/19/22 0230 11/19/22 0324 11/19/22 0345 11/19/22 0600  BP: (!) 114/58  113/64 110/64  Pulse: (!) 105 97 93 79  Resp: 10 (!) 26 17 19   Temp:    98.4 F (36.9 C)  TempSrc:      SpO2: (!) 88% 91% 91% 91%   Physical Exam Vitals and nursing note reviewed.  Constitutional:      General: He is awake. He is not in acute distress.    Appearance: He is well-developed. He is morbidly obese. He is ill-appearing.  HENT:     Head: Normocephalic.     Nose: No rhinorrhea.     Mouth/Throat:     Mouth: Mucous membranes are moist.  Eyes:     General: No scleral icterus.    Pupils: Pupils are equal, round, and reactive to light.  Neck:     Vascular: No JVD.  Cardiovascular:     Rate and Rhythm: Normal rate and regular rhythm.     Heart sounds: S1 normal and S2 normal.     Comments:  Nonpitting.  Stage II lymphedema. Pulmonary:     Breath sounds: Examination of the right-lower field reveals decreased breath sounds. Decreased breath sounds, wheezing, rhonchi and rales present.  Abdominal:     General: Bowel sounds are normal.     Palpations: Abdomen is soft.  Musculoskeletal:     Cervical back: Neck supple.     Right lower leg: Edema present.     Left lower leg: Edema present.     Comments: Stage II lymphedema.  Nonpitting.  Skin:    General: Skin is warm and dry.  Neurological:     General: No focal deficit present.     Mental Status: He is alert.  Psychiatric:        Mood and Affect: Mood normal.        Behavior: Behavior is cooperative.     Data Reviewed:  Results are pending, will review when available.  06/22/2022 transthoracic echocardiogram IMPRESSIONS:   1. Left ventricular ejection fraction, by estimation, is 60 to 65%. Left  ventricular ejection fraction by 3D volume is 61 %. The left ventricle has  normal function. The left ventricle has no regional wall motion  abnormalities. Left ventricular diastolic   parameters are consistent with Grade I diastolic dysfunction (impaired  relaxation).   2. Right ventricular systolic function is normal. The right ventricular  size is mildly enlarged. Tricuspid regurgitation signal is inadequate for  assessing PA pressure.   3. The mitral valve is normal in structure. No evidence of mitral valve  regurgitation. No evidence of mitral stenosis.   4. The aortic valve is normal in structure. Aortic valve regurgitation is  not visualized. No aortic stenosis is present.   EKG: Vent. rate 110 BPM PR interval 152 ms QRS duration 99 ms QT/QTcB 312/422 ms P-R-T axes 19 -56 69 Sinus tachycardia Left anterior fascicular block Low voltage, precordial leads RSR' in V1 or V2, probably normal variant Artifact in lead(s) I II aVR aVL V1  Assessment and Plan: Principal Problem:   Acute respiratory failure with  hypoxemia (HCC) In the setting of:   Aspiration pneumonia (HCC) Superimposed on:   Asthma Admit to telemetry/inpatient. Continue supplemental oxygen. Scheduled and as needed bronchodilators. No glucocorticoids. Continue Unasyn 3 g IVPB every 6 hours. Check strep pneumoniae urinary antigen. Check sputum Gram stain, culture and sensitivity. Follow-up blood culture and sensitivity. Follow-up CBC and chemistry in the morning.  Active Problems:   Essential hypertension On torsemide 40 mg p.o. twice daily. Monitor BP, GFR and electrolytes.    Chronic diastolic CHF (congestive heart failure) (HCC) No signs of volume overload. Resume torsemide 40 mg p.o. twice daily tomorrow. Might benefit from an ACE inhibitor and/or beta-blocker..    Depression Continue sertraline 100 mg p.o. daily. Continue trazodone 100 mg p.o. at bedtime as needed.    Prediabetes Carbohydrate modified diet. CBG monitoring with [facility] SS. Check hemoglobin A1c.    Morbid obesity with BMI of 50.0-59.9, adult (HCC) Current BMI 53.86 kg/m. Lifestyle modifications. Follow-up with closely PCP and/or bariatric clinic.    Mild protein malnutrition (HCC) Needs to decrease carbohydrate intake. Might benefit from inpatient or outpatient nutritional services evaluation.     Advance Care Planning:   Code Status: Full Code   Consults:   Family Communication:   Severity of Illness: The appropriate patient status for this patient is INPATIENT. Inpatient status is judged to be reasonable and necessary in order to provide the required intensity of service to ensure the patient's safety. The patient's presenting symptoms, physical exam findings, and initial radiographic and laboratory data in the context of their chronic comorbidities is felt to place them at high risk for further clinical deterioration. Furthermore, it is not anticipated that the patient will be medically stable for discharge from the hospital within 2  midnights of admission.   * I certify that at the point of admission it is my clinical judgment that the patient will require inpatient hospital care spanning beyond 2 midnights from the point of admission due to high intensity of service, high risk for further deterioration and  high frequency of surveillance required.*  Author: Bobette Mo, MD 11/19/2022 7:15 AM  For on call review [email or web address].   This document was prepared using Dragon voice recognition software and may contain some unintended transcription errors.

## 2022-11-19 NOTE — ED Notes (Signed)
Lactic Result: 1.26

## 2022-11-19 NOTE — ED Notes (Signed)
ED TO INPATIENT HANDOFF REPORT  Name/Age/Gender Christian [name] 47 y.o. male  Code Status    Code Status Orders  (From admission, onward)           Start     Ordered   11/19/22 0558  Full code  Continuous       Question:  By:  Answer:  Consent: discussion documented in EHR   11/19/22 0558           Code Status History     Date Active Date Inactive Code Status Order ID Comments User Context   06/14/2022 2221 06/25/2022 2018 Full Code [id number]  Darlin Drop, DO ED   01/08/2021 2330 02/12/2021 1804 Full Code [id number]  Gertha Calkin, MD ED   10/09/2020 1236 12/01/2020 0021 Full Code [id number]  Cox, Amy Dorris Carnes, DO ED       Home/SNF/Other Home  Chief Complaint Aspiration pneumonia (HCC) [J69.0]  Level of Care/Admitting Diagnosis ED Disposition     ED Disposition  Admit   Condition  --   Comment  Hospital Area: [facility]  HOSPITAL [[id number]]  Level of Care: Telemetry [5]  Admit to tele based on following criteria: Monitor for Ischemic changes  May admit patient to [facility] or [facility] if equivalent level of care is available:: No  Covid Evaluation: Asymptomatic - no recent exposure (last 10 days) testing not required  Diagnosis: Aspiration pneumonia [facility]) [[id number]]  Admitting Physician: Angie Fava [[id number]]  Attending Physician: Angie Fava [[id number]]  Certification:: I certify this patient will need inpatient services for at least 2 midnights  Expected Medical Readiness: 11/21/2022          Medical History Past Medical History:  Diagnosis Date   Allergic rhinitis    Anxiety    CHF (congestive heart failure) (HCC)    Depression    Depression    Elevated blood pressure reading without diagnosis of hypertension    History of chicken pox    Obesity    Pleural effusion    Sepsis (HCC)    Sleep apnea    O2 at night    Allergies Allergies  Allergen Reactions   Shellfish Allergy Nausea Only    IV  Location/Drains/Wounds Patient Lines/Drains/Airways Status     Active Line/Drains/Airways     Name Placement date Placement time Site Days   Peripheral IV 11/19/22 20 G Anterior;Distal;Right;Upper Arm 11/19/22  0324  Arm  less than 1   Peripheral IV 11/19/22 20 G Left Antecubital 11/19/22  0354  Antecubital  less than 1   Pressure Injury 10/09/20 Buttocks Right Unstageable - Full thickness tissue loss in which the base of the injury is covered by slough (yellow, tan, gray, green or brown) and/or eschar (tan, brown or black) in the wound bed. 10/09/20  1933  -- 771   Pressure Injury 10/22/20 Thigh Posterior;Proximal;Right Stage 3 -  Full thickness tissue loss. Subcutaneous fat may be visible but bone, tendon or muscle are NOT exposed. 10/22/20  1055  -- 758   Wound / Incision (Open or Dehisced) 01/31/21 (MASD) Moisture Associated Skin Damage Thigh Left;Posterior;Proximal Reddent (Blanchable) area on left thigh (previous site of pressure injury per patient) 01/31/21  1400  Thigh  657            Labs/Imaging Results for orders placed or performed during the hospital encounter of 11/18/22 (from the past 48 hour(s))  Comprehensive metabolic panel     Status: Abnormal   Collection Time:  11/19/22 12:55 AM  Result Value Ref Range   Sodium 138 135 - 145 mmol/L   Potassium 4.0 3.5 - 5.1 mmol/L   Chloride 106 98 - 111 mmol/L   CO2 25 22 - 32 mmol/L   Glucose, Bld 142 (H) 70 - 99 mg/dL    Comment: Glucose reference range applies only to samples taken after fasting for at least 8 hours.   BUN 20 6 - 20 mg/dL   Creatinine, Ser 4.09 0.61 - 1.24 mg/dL   Calcium 8.1 (L) 8.9 - 10.3 mg/dL   Total Protein 5.8 (L) 6.5 - 8.1 g/dL   Albumin 3.2 (L) 3.5 - 5.0 g/dL   AST 14 (L) 15 - 41 U/L   ALT 14 0 - 44 U/L   Alkaline Phosphatase 75 38 - 126 U/L   Total Bilirubin 0.8 0.3 - 1.2 mg/dL   GFR, Estimated >81 >19 mL/min    Comment: (NOTE) Calculated using the CKD-EPI Creatinine Equation (2021)    Anion  gap 7 5 - 15    Comment: Performed at [facility], [address] [place], [place] [address]  CBC     Status: Abnormal   Collection Time: 11/19/22 12:55 AM  Result Value Ref Range   WBC 11.2 (H) 4.0 - 10.5 K/uL   RBC 5.06 4.22 - 5.81 MIL/uL   Hemoglobin 13.3 13.0 - 17.0 g/dL   HCT 95.6 21.3 - 08.6 %   MCV 82.6 80.0 - 100.0 fL   MCH 26.3 26.0 - 34.0 pg   MCHC 31.8 30.0 - 36.0 g/dL   RDW 57.8 46.9 - 62.9 %   Platelets 306 150 - 400 K/uL   nRBC 0.0 0.0 - 0.2 %    Comment: Performed at [facility], [address] [place], [place] [address]  Resp panel by RT-PCR (RSV, Flu A&B, Covid) Anterior Nasal Swab     Status: None   Collection Time: 11/19/22  1:15 AM   Specimen: Anterior Nasal Swab  Result Value Ref Range   SARS Coronavirus 2 by RT PCR NEGATIVE NEGATIVE    Comment: (NOTE) SARS-CoV-2 target nucleic acids are NOT DETECTED.  The SARS-CoV-2 RNA is generally detectable in upper respiratory specimens during the acute phase of infection. The lowest concentration of SARS-CoV-2 viral copies this assay can detect is 138 copies/mL. A negative result does not preclude SARS-Cov-2 infection and should not be used as the sole basis for treatment or other patient management decisions. A negative result may occur with  improper specimen collection/handling, submission of specimen other than nasopharyngeal swab, presence of viral mutation(s) within the areas targeted by this assay, and inadequate number of viral copies(<138 copies/mL). A negative result must be combined with clinical observations, patient history, and epidemiological information. The expected result is Negative.  Fact Sheet for Patients:  BloggerCourse.com  Fact Sheet for Healthcare Providers:  SeriousBroker.it  This test is no t yet approved or cleared by the [place] FDA and  has been authorized for detection and/or diagnosis  of SARS-CoV-2 by FDA under an Emergency Use Authorization (EUA). This EUA will remain  in effect (meaning this test can be used) for the duration of the COVID-19 declaration under Section 564(b)(1) of the Act, 21 U.S.C.section 360bbb-3(b)(1), unless the authorization is terminated  or revoked sooner.       Influenza A by PCR NEGATIVE NEGATIVE   Influenza B by PCR NEGATIVE NEGATIVE    Comment: (NOTE) The Xpert Xpress SARS-CoV-2/FLU/RSV plus assay  is intended as an aid in the diagnosis of influenza from Nasopharyngeal swab specimens and should not be used as a sole basis for treatment. Nasal washings and aspirates are unacceptable for Xpert Xpress SARS-CoV-2/FLU/RSV testing.  Fact Sheet for Patients: BloggerCourse.com  Fact Sheet for Healthcare Providers: SeriousBroker.it  This test is not yet approved or cleared by the [place] FDA and has been authorized for detection and/or diagnosis of SARS-CoV-2 by FDA under an Emergency Use Authorization (EUA). This EUA will remain in effect (meaning this test can be used) for the duration of the COVID-19 declaration under Section 564(b)(1) of the Act, 21 U.S.C. section 360bbb-3(b)(1), unless the authorization is terminated or revoked.     Resp Syncytial Virus by PCR NEGATIVE NEGATIVE    Comment: (NOTE) Fact Sheet for Patients: BloggerCourse.com  Fact Sheet for Healthcare Providers: SeriousBroker.it  This test is not yet approved or cleared by the [place] FDA and has been authorized for detection and/or diagnosis of SARS-CoV-2 by FDA under an Emergency Use Authorization (EUA). This EUA will remain in effect (meaning this test can be used) for the duration of the COVID-19 declaration under Section 564(b)(1) of the Act, 21 U.S.C. section 360bbb-3(b)(1), unless the authorization is terminated or revoked.  Performed at [facility], [address] [place], [place] [address]   Urinalysis, w/ Reflex to Culture (Infection Suspected) -Urine, Clean Catch     Status: None   Collection Time: 11/19/22  2:31 AM  Result Value Ref Range   Specimen Source URINE, CLEAN CATCH    Color, Urine YELLOW YELLOW   APPearance CLEAR CLEAR   Specific Gravity, Urine 1.015 1.005 - 1.030   pH 6.0 5.0 - 8.0   Glucose, UA NEGATIVE NEGATIVE mg/dL   Hgb urine dipstick NEGATIVE NEGATIVE   Bilirubin Urine NEGATIVE NEGATIVE   Ketones, ur NEGATIVE NEGATIVE mg/dL   Protein, ur NEGATIVE NEGATIVE mg/dL   Nitrite NEGATIVE NEGATIVE   Leukocytes,Ua NEGATIVE NEGATIVE   RBC / HPF 0-5 0 - 5 RBC/hpf   WBC, UA 0-5 0 - 5 WBC/hpf    Comment:        Reflex urine culture not performed if WBC <=10, OR if Squamous epithelial cells >5. If Squamous epithelial cells >5 suggest recollection.    Bacteria, UA NONE SEEN NONE SEEN   Squamous Epithelial / HPF 0-5 0 - 5 /HPF   Mucus PRESENT     Comment: Performed at [facility], [address], [place], [place] [address]  Protime-INR     Status: None   Collection Time: 11/19/22  3:22 AM  Result Value Ref Range   Prothrombin Time 14.5 11.4 - 15.2 seconds   INR 1.1 0.8 - 1.2    Comment: (NOTE) INR goal varies based on device and disease states. Performed at [facility], [address] [place], [place] [address]   APTT     Status: None   Collection Time: 11/19/22  3:22 AM  Result Value Ref Range   aPTT 31 24 - 36 seconds    Comment: Performed at [facility], [address], [place], [place] [address]  Blood Culture (routine x 2)     Status: None (Preliminary result)   Collection Time: 11/19/22  3:22 AM   Specimen: BLOOD  Result Value Ref Range   Specimen Description      BLOOD SITE NOT SPECIFIED Performed at [facility], [address] [place], [place] [address]  Special Requests      Blood Culture adequate  volume BOTTLES DRAWN AEROBIC AND ANAEROBIC Performed at [facility], [address], [place], [place] [address]    Culture PENDING    Report Status PENDING   Blood Culture (routine x 2)     Status: None (Preliminary result)   Collection Time: 11/19/22  3:22 AM   Specimen: BLOOD  Result Value Ref Range   Specimen Description      BLOOD SITE NOT SPECIFIED Performed at [facility], [address] [place], [place] [address]    Special Requests      Blood Culture adequate volume BOTTLES DRAWN AEROBIC AND ANAEROBIC Performed at [facility], [address], [place], [place] [address]    Culture PENDING    Report Status PENDING   Brain natriuretic peptide     Status: None   Collection Time: 11/19/22  3:22 AM  Result Value Ref Range   B Natriuretic Peptide 67.9 0.0 - 100.0 pg/mL    Comment: Performed at [facility], [address] [place], [place] [address]  Troponin I (High Sensitivity)     Status: None   Collection Time: 11/19/22  3:22 AM  Result Value Ref Range   Troponin I (High Sensitivity) 15 <18 ng/L    Comment: (NOTE) Elevated high sensitivity troponin I (hsTnI) values and significant  changes across serial measurements may suggest ACS but many other  chronic and acute conditions are known to elevate hsTnI results.  Refer to the "Links" section for chest pain algorithms and additional  guidance. Performed at [facility], [address], [place], [place] [address]   Troponin I (High Sensitivity)     Status: None   Collection Time: 11/19/22  4:50 AM  Result Value Ref Range   Troponin I (High Sensitivity) 13 <18 ng/L    Comment: (NOTE) Elevated high sensitivity troponin I (hsTnI) values and significant  changes across serial measurements may suggest ACS but many other  chronic and acute conditions are known to elevate hsTnI results.  Refer to the "Links" section for chest pain algorithms  and additional  guidance. Performed at [facility], [address], [place], [place] [address]    CT Angio Chest PE W and/or Wo Contrast  Result Date: 11/19/2022 CLINICAL DATA:  Shortness of breath with possible aspiration wall eating chicken wings. EXAM: CT ANGIOGRAPHY CHEST WITH CONTRAST TECHNIQUE: Multidetector CT imaging of the chest was performed using the standard protocol during bolus administration of intravenous contrast. Multiplanar CT image reconstructions and MIPs were obtained to evaluate the vascular anatomy. RADIATION DOSE REDUCTION: This exam was performed according to the departmental dose-optimization program which includes automated exposure control, adjustment of the mA and/or kV according to patient size and/or use of iterative reconstruction technique. CONTRAST:  75mL OMNIPAQUE IOHEXOL 350 MG/ML SOLN COMPARISON:  June 14, 2022 FINDINGS: Cardiovascular: The thoracic aorta is normal in appearance. Satisfactory opacification of the pulmonary arteries to the segmental level. No evidence of pulmonary embolism. Normal heart size. No pericardial effusion. Mediastinum/Nodes: There is mild paratracheal, right hilar and subcarinal lymphadenopathy. Thyroid gland, trachea, and esophagus demonstrate no significant findings. Lungs/Pleura: Moderate to marked severity multifocal infiltrates are seen throughout the right upper lobe, right middle lobe and right lower lobe. Mild left upper lobe and left lower lobe multifocal infiltrates are also noted. Mild right-sided volume loss is seen. There is a very small right pleural effusion. No pneumothorax is  identified. Upper Abdomen: A 3.5 cm diameter simple cyst is seen within the anterolateral aspect of the mid left kidney. Musculoskeletal: No chest wall abnormality. No acute or significant osseous findings. Review of the MIP images confirms the above findings. IMPRESSION: 1. No evidence of pulmonary embolism. 2. Moderate to marked  severity multifocal infiltrates throughout the right upper lobe, right middle lobe and right lower lobe. 3. Mild left upper lobe and left lower lobe multifocal infiltrates. 4. Very small right pleural effusion. 5. Mild mediastinal and right hilar lymphadenopathy. 6. Simple left renal cyst. No follow-up imaging is recommended. This recommendation follows ACR consensus guidelines: Management of the Incidental Renal Mass on CT: A White Paper of the ACR Incidental Findings Committee. J Am Coll Radiol [phone]. Electronically Signed   By: Aram Candela M.D.   On: 11/19/2022 03:24   DG Chest Port 1 View  Result Date: 11/19/2022 CLINICAL DATA:  Shortness of breath and possible aspiration while eating. EXAM: PORTABLE CHEST 1 VIEW COMPARISON:  October 17, 2022 FINDINGS: The cardiac silhouette is mildly enlarged which may be, in part, technical in origin. Mild to moderate severity infiltrate is seen within the mid right lung and right lung base. Right-sided volume loss is also noted. Mild elevation of the right hemidiaphragm is again seen. No pleural effusion or pneumothorax is identified. The visualized skeletal structures are unremarkable. IMPRESSION: Mild to moderate severity right-sided infiltrate. Electronically Signed   By: Aram Candela M.D.   On: 11/19/2022 01:54    Pending Labs Unresulted Labs (From admission, onward)     Start     Ordered   11/20/22 0500  Comprehensive metabolic panel  Tomorrow morning,   R        11/19/22 1024   11/20/22 0500  CBC  Tomorrow morning,   R        11/19/22 1024   11/20/22 0500  Magnesium  Tomorrow morning,   R        11/19/22 1024   11/20/22 0500  Phosphorus  Tomorrow morning,   R        11/19/22 1024   11/19/22 1025  Hemoglobin A1c  Tomorrow morning,   R       Comments: To assess prior glycemic control    11/19/22 1024            Vitals/Pain Today's Vitals   11/19/22 0345 11/19/22 0600 11/19/22 0915 11/19/22 0953  BP: 113/64 110/64 (!)  118/59   Pulse: 93 79 73   Resp: 17 19 17    Temp:  98.4 F (36.9 C)  97.6 F (36.4 C)  TempSrc:    Oral  SpO2: 91% 91% 97%   PainSc:        Isolation Precautions No active isolations  Medications Medications  acetaminophen (TYLENOL) tablet 650 mg (has no administration in time range)    Or  acetaminophen (TYLENOL) suppository 650 mg (has no administration in time range)  ondansetron (ZOFRAN) injection 4 mg (has no administration in time range)  Ampicillin-Sulbactam (UNASYN) 3 g in sodium chloride 0.9 % 100 mL IVPB (has no administration in time range)  sertraline (ZOLOFT) tablet 150 mg (150 mg Oral Given 11/19/22 1054)  traZODone (DESYREL) tablet 100 mg (has no administration in time range)  torsemide (DEMADEX) tablet 40 mg (has no administration in time range)  potassium chloride SA (KLOR-CON M) CR tablet 40 mEq (has no administration in time range)  ipratropium-albuterol (DUONEB) 0.5-2.5 (3) MG/3ML nebulizer solution 3 mL (3 mLs Nebulization  Given 11/19/22 1058)  insulin aspart (novoLOG) injection 0-20 Units (has no administration in time range)  acetaminophen (TYLENOL) tablet 650 mg (650 mg Oral Given 11/19/22 0325)  cefTRIAXone (ROCEPHIN) 2 g in sodium chloride 0.9 % 100 mL IVPB (0 g Intravenous Stopped 11/19/22 0353)  iohexol (OMNIPAQUE) 350 MG/ML injection 75 mL (75 mLs Intravenous Contrast Given 11/19/22 0249)  metroNIDAZOLE (FLAGYL) IVPB 500 mg (0 mg Intravenous Stopped 11/19/22 0526)  lactated ringers bolus 2,500 mL (0 mLs Intravenous Stopped 11/19/22 0952)    Mobility walks with person assist

## 2022-11-19 NOTE — Progress Notes (Signed)
  Carryover admission to the Day Admitter.  I discussed this case with the EDP, Berle Mull, PA.  Per these discussions:   This is a 47 year old male who is being admitted with aspiration pneumonia.  11/18/2022, he choked on a chicken wing, requiring Heimlich maneuver.  Over the ensuing hours he developed shortness of breath, cough, subjective fever.  In the ED, he was noted to be mildly tachycardic, as well as hypoxic, requiring 3 L nasal cannula, which is a new requirement for him.  CTA chest showed evidence of bilateral airspace opacities, right right greater than left, concerning for aspiration pneumonia.   Blood cultures x 2 were collected and the patient was started on Rocephin and Flagyl.  I have placed an order for inpatient admission to med/tele for further evaluation and management of the above.  I have placed some additional preliminary admit orders via the adult multi-morbid admission order set. I have also ordered Unasyn, n.p.o., and as needed Zofran.    Newton Pigg, DO Hospitalist

## 2022-11-19 NOTE — Progress Notes (Signed)
   11/19/22 2305  BiPAP/CPAP/SIPAP  $ Non-Invasive Home Ventilator  Initial  $ Face Mask Large  Yes  BiPAP/CPAP/SIPAP Pt Type Adult  BiPAP/CPAP/SIPAP DREAMSTATIOND  Mask Type Full face mask  Mask Size Large  Respiratory Rate 22 breaths/min  Flow Rate 3 lpm  Patient Home Equipment No  Auto Titrate Yes (auto 12/8 per pt request)  CPAP/SIPAP surface wiped down Yes  BiPAP/CPAP /SiPAP Vitals  Resp (!) 22  SpO2 95 %  MEWS Score/Color  MEWS Score 1  MEWS Score Color [place] Si

## 2022-11-20 DIAGNOSIS — J9601 Acute respiratory failure with hypoxia: Secondary | ICD-10-CM

## 2022-11-20 DIAGNOSIS — J4530 Mild persistent asthma, uncomplicated: Secondary | ICD-10-CM | POA: Diagnosis not present

## 2022-11-20 DIAGNOSIS — Z6841 Body Mass Index (BMI) 40.0 and over, adult: Secondary | ICD-10-CM

## 2022-11-20 DIAGNOSIS — F32A Depression, unspecified: Secondary | ICD-10-CM

## 2022-11-20 DIAGNOSIS — I5032 Chronic diastolic (congestive) heart failure: Secondary | ICD-10-CM | POA: Diagnosis not present

## 2022-11-20 DIAGNOSIS — J690 Pneumonitis due to inhalation of food and vomit: Secondary | ICD-10-CM | POA: Diagnosis not present

## 2022-11-20 DIAGNOSIS — E441 Mild protein-calorie malnutrition: Secondary | ICD-10-CM

## 2022-11-20 DIAGNOSIS — I10 Essential (primary) hypertension: Secondary | ICD-10-CM

## 2022-11-20 LAB — CBC
HCT: 38.7 % — ABNORMAL LOW (ref 39.0–52.0)
Hemoglobin: 12.4 g/dL — ABNORMAL LOW (ref 13.0–17.0)
MCH: 26.8 pg (ref 26.0–34.0)
MCHC: 32 g/dL (ref 30.0–36.0)
MCV: 83.8 fL (ref 80.0–100.0)
Platelets: 235 10*3/uL (ref 150–400)
RBC: 4.62 MIL/uL (ref 4.22–5.81)
RDW: 14.6 % (ref 11.5–15.5)
WBC: 9 10*3/uL (ref 4.0–10.5)
nRBC: 0 % (ref 0.0–0.2)

## 2022-11-20 LAB — COMPREHENSIVE METABOLIC PANEL
ALT: 13 U/L (ref 0–44)
AST: 11 U/L — ABNORMAL LOW (ref 15–41)
Albumin: 3.1 g/dL — ABNORMAL LOW (ref 3.5–5.0)
Alkaline Phosphatase: 68 U/L (ref 38–126)
Anion gap: 9 (ref 5–15)
BUN: 12 mg/dL (ref 6–20)
CO2: 25 mmol/L (ref 22–32)
Calcium: 8.4 mg/dL — ABNORMAL LOW (ref 8.9–10.3)
Chloride: 107 mmol/L (ref 98–111)
Creatinine, Ser: 0.73 mg/dL (ref 0.61–1.24)
GFR, Estimated: >60 mL/min (ref >60)
Glucose, Bld: 91 mg/dL (ref 70–99)
Potassium: 3.8 mmol/L (ref 3.5–5.1)
Sodium: 141 mmol/L (ref 135–145)
Total Bilirubin: 1 mg/dL (ref 0.3–1.2)
Total Protein: 6.2 g/dL — ABNORMAL LOW (ref 6.5–8.1)

## 2022-11-20 LAB — GLUCOSE, CAPILLARY
Glucose-Capillary: 102 mg/dL — ABNORMAL HIGH (ref 70–99)
Glucose-Capillary: 132 mg/dL — ABNORMAL HIGH (ref 70–99)
Glucose-Capillary: 133 mg/dL — ABNORMAL HIGH (ref 70–99)
Glucose-Capillary: 119 mg/dL — ABNORMAL HIGH (ref 70–99)

## 2022-11-20 LAB — PHOSPHORUS: Phosphorus: 3.3 mg/dL (ref 2.5–4.6)

## 2022-11-20 LAB — MAGNESIUM: Magnesium: 2.2 mg/dL (ref 1.7–2.4)

## 2022-11-20 MED ORDER — GUAIFENESIN ER 600 MG PO TB12
1200.0000 mg | ORAL_TABLET | Freq: Two times a day (BID) | ORAL | Status: DC
  Administered 2022-11-20 – 2022-11-28 (×16): 1200 mg via ORAL
  Filled 2022-11-20 (×16): qty 2

## 2022-11-20 NOTE — Progress Notes (Signed)
   11/20/22 2336  BiPAP/CPAP/SIPAP  BiPAP/CPAP/SIPAP Pt Type Adult  BiPAP/CPAP/SIPAP DREAMSTATIOND  Mask Type Full face mask  Mask Size Large  Respiratory Rate 20 breaths/min  Flow Rate (S)  4 lpm  Patient Home Equipment No  Auto Titrate Yes (auto 12/8 per pt)  CPAP/SIPAP surface wiped down Yes  BiPAP/CPAP /SiPAP Vitals  Pulse Rate 86  Resp 20  SpO2 93 %  Bilateral Breath Sounds Diminished;Expiratory wheezes  MEWS Score/Color  MEWS Score 0  MEWS Score Color Christian [name]

## 2022-11-20 NOTE — Progress Notes (Signed)
PROGRESS NOTE    Christian [name]  PPI:[id number] DOB: [birth year] DOA: 11/18/2022 PCP: [name], MD   Brief Narrative:  HPI per Dr. Sanda Klein on 11/19/22  Christian [name] is a 47 y.o. male with medical history significant of allergic rhinitis, anxiety, chronic diastolic CHF, depression, herpes zoster, obesity, pleural effusion, sleep apnea who presented to the emergency department with complaints of dyspnea after he choked with a piece of chicken requiring the Heimlich maneuver.  He initially require NRB mask at 15 L/min but is now down to 4 LPM.  He has had a previous episode of aspiration in the past. He had wheezing and productive cough afterwards.  Denied fever, chills, rhinorrhea, sore throat or hemoptysis.  No chest pain, palpitations, diaphoresis, PND, orthopnea or but has frequent pitting edema of the lower extremities.  No abdominal pain, nausea, emesis, diarrhea, constipation, melena or hematochezia.  No flank pain, dysuria, frequency or hematuria.  No polyuria, polydipsia, polyphagia or blurred vision.   Lab work: Urinalysis was normal.  Negative coronavirus, influenza and RSV PCR.  Normal PT, INR and PTT.  CBCs are white count 11.2, hemoglobin 13.3 g/dL and platelets 063.  Troponin x 2 and BNP were normal.   Imaging: Portable 1 view chest radiograph with mild to moderate severity right-sided infiltrate.  CTA chest no evidence of PE.  There is moderate to marked severity multifocal infiltrates throughout the right upper lobe, right middle lobe and right lower lobe.  Mild left upper lobe and left lower lobe multifocal infiltrates.  Very small right pleural effusion.  Mild mediastinal and right hilar lymphadenopathy.  Simple left renal cysts with no follow-up recommended.   ED course: Initial vital signs were temperature 97.5 F, pulse 110, respirations 24, BP 142/99 mmHg O2 sat 92% on nonrebreather mask.  The patient received acetaminophen 650 mg p.o., ceftriaxone 2 g IVPB,  metronidazole 500 mg IVPB and 2500 mL LR bolus.  **Interim History Patient continues to be a little bit dyspneic but imaging showed no evidence of the PE but did show moderate to mild severity right-sided infiltrate.  Will continue antibiotics and obtain SLP evaluation.  Will also repeat chest x-ray in a.m. and continue to adjust his breathing regimen  Assessment and Plan:  Acute Respiratory Failure with Hypoxemia (HCC)in the setting of  Aspiration Pneumonia (HCC) Superimposed on Asthma -Admit to telemetry/inpatient. -Continue supplemental oxygen. SpO2: 93 % O2 Flow Rate (L/min): 4 L/min -Scheduled and as needed bronchodilators. -No glucocorticoids. -Continue Unasyn 3 g IVPB every 6 hours. -Check strep pneumoniae urinary antigen. -Check sputum Gram stain, culture and sensitivity. -Follow-up blood culture and sensitivity. -Aspiration Precautions -Continue supplemental oxygen via nasal cannula wean O2 as tolerated -Continuous pulse oximetry maintain O2 saturation greater 90% -SLP Evaluation -Add flutter valve, incentive spirometry and guaifenesin 1201 p.o. twice daily -Repeat CXR in the AM    Essential Hypertension -On torsemide 40 mg p.o. twice daily. -Continue Monitor BP, GFR and electrolytes. -Last blood pressure reading was 135/69   Chronic diastolic CHF (congestive heart failure) (HCC) -Euvolemic No signs of volume overload. -Resume torsemide 40 mg p.o. twice daily -Might benefit from an ACE inhibitor and/or beta-blocker -Strict I's and O's and daily weights;  Intake/Output Summary (Last 24 hours) at 11/20/2022 1809 Last data filed at 11/20/2022 1500 Gross per 24 hour  Intake --  Output 1500 ml  Net -1500 ml  -Continue to monitor for signs or symptoms of volume overload   Depression -Continue Sertraline 100 mg p.o. daily and  Trazodone 100 mg p.o. at bedtime as needed.   PreDiabetes -Carbohydrate modified diet. -CBG monitoring with RI SS. -Check hemoglobin A1c. -CBG  and Glucose Trend: Recent Labs  Lab 11/19/22 1335 11/19/22 1630 11/19/22 2021 11/20/22 0752 11/20/22 1139 11/20/22 1621  GLUCAP 87 150* 107* 102* 132* 133*   Recent Labs  Lab 11/19/22 0055 11/20/22 0626  GLUCOSE 142* 91    Mild Protein Malnutrition (HCC) -Needs to decrease carbohydrate intake. -Will consult Dietitian for further evaluation, assessment and recommendations  Normocytic Anemia -Hgb/Hct Trend: Recent Labs  Lab 11/19/22 0055 11/20/22 0626  HGB 13.3 12.4*  HCT 41.8 38.7*  MCV 82.6 83.8  -Check Anemia Panel in the AM -Continue to Monitor for S/Sx of Bleeding; No overt bleeding noted -Repeat CBC in the AM   Hypoalbuminemia -Patient's Albumin Trend: Recent Labs  Lab 11/19/22 0055 11/20/22 0626  ALBUMIN 3.2* 3.1*  -Continue to Monitor and Trend and repeat CMP in the AM  Super Morbid Obesity -Complicates overall prognosis and care -Estimated body mass index is 54.8 kg/m as calculated from the following:   Height as of 10/17/22: 6\' 1"  (1.854 m).   Weight as of this encounter: 188.4 kg.  -Weight Loss and Dietary Counseling given with Lifestyle modifications. -Follow-up with closely PCP and/or bariatric clinic.   DVT prophylaxis: SCDs Start: 11/19/22 0558    Code Status: Full Code Family Communication: No family present at bedside  Disposition Plan:  Level of care: Telemetry Status is: Inpatient Remains inpatient appropriate because: Further clinical improvement and weaning of oxygen   Consultants:  None  Procedures:  As delineated above  Antimicrobials:  Anti-infectives (From admission, onward)    Start     Dose/Rate Route Frequency Ordered Stop   11/19/22 1600  Ampicillin-Sulbactam (UNASYN) 3 g in sodium chloride 0.9 % 100 mL IVPB        3 g 200 mL/hr over 30 Minutes Intravenous Every 6 hours 11/19/22 0611     11/19/22 0345  metroNIDAZOLE (FLAGYL) IVPB 500 mg        500 mg 100 mL/hr over 60 Minutes Intravenous  Once 11/19/22 0341  11/19/22 0526   11/19/22 0245  cefTRIAXone (ROCEPHIN) 2 g in sodium chloride 0.9 % 100 mL IVPB        2 g 200 mL/hr over 30 Minutes Intravenous  Once 11/19/22 0231 11/19/22 0353       Subjective: Seen and examined at bedside and he is still little short of breath.  States that he choked on chicken and had has a Heimlich maneuver but became sick after that.  No nausea or vomiting.  Denies any lightheadedness or dizziness.  No other concerns or complaints at this time.  Objective: Vitals:   11/20/22 0614 11/20/22 0833 11/20/22 1219 11/20/22 1536  BP: 123/64  135/69   Pulse: 79  87   Resp: 18  18   Temp: 98.4 F (36.9 C)  98.7 F (37.1 C)   TempSrc: Oral  Oral   SpO2: 93% 94% 91% 93%  Weight:        Intake/Output Summary (Last 24 hours) at 11/20/2022 1815 Last data filed at 11/20/2022 1500 Gross per 24 hour  Intake --  Output 1500 ml  Net -1500 ml   Filed Weights   11/20/22 0232  Weight: (!) 188.4 kg   Examination: Physical Exam:  Constitutional: WN/WD super morbidly obese Caucasian male in some mild respiratory distress Respiratory: Diminished to auscultation bilaterally with some coarse breath sounds, rhonchi with no  appreciable wheezing or crackles. Normal respiratory effort and patient is not tachypenic. No accessory muscle use.  Wearing supplemental oxygen nasal cannula Cardiovascular: RRR, no murmurs / rubs / gallops. S1 and S2 auscultated. No extremity edema.   Abdomen: Soft, non-tender, distended secondary to body habitus. Bowel sounds positive.  GU: Deferred. Musculoskeletal: No clubbing / cyanosis of digits/nails. No joint deformity upper and lower extremities.  Skin: No rashes, lesions, ulcers on a limited skin evaluation. No induration; Warm and dry.  Neurologic: CN 2-12 grossly intact with no focal deficits. Romberg sign and cerebellar reflexes not assessed.  Psychiatric: Normal judgment and insight. Alert and oriented x 3. Normal mood and appropriate affect.    Data Reviewed: I have personally reviewed following labs and imaging studies  CBC: Recent Labs  Lab 11/19/22 0055 11/20/22 0626  WBC 11.2* 9.0  HGB 13.3 12.4*  HCT 41.8 38.7*  MCV 82.6 83.8  PLT 306 235   Basic Metabolic Panel: Recent Labs  Lab 11/19/22 0055 11/20/22 0626  NA 138 141  K 4.0 3.8  CL 106 107  CO2 25 25  GLUCOSE 142* 91  BUN 20 12  CREATININE 0.98 0.73  CALCIUM 8.1* 8.4*  MG  --  2.2  PHOS  --  3.3   GFR: Estimated Creatinine Clearance: 196.9 mL/min (by C-G formula based on SCr of 0.73 mg/dL). Liver Function Tests: Recent Labs  Lab 11/19/22 0055 11/20/22 0626  AST 14* 11*  ALT 14 13  ALKPHOS 75 68  BILITOT 0.8 1.0  PROT 5.8* 6.2*  ALBUMIN 3.2* 3.1*   No results for input(s): "LIPASE", "AMYLASE" in the last 168 hours. No results for input(s): "AMMONIA" in the last 168 hours. Coagulation Profile: Recent Labs  Lab 11/19/22 0322  INR 1.1   Cardiac Enzymes: No results for input(s): "CKTOTAL", "CKMB", "CKMBINDEX", "TROPONINI" in the last 168 hours. BNP (last 3 results) No results for input(s): "PROBNP" in the last 8760 hours. HbA1C: Recent Labs    11/19/22 1220  HGBA1C 5.8*   CBG: Recent Labs  Lab 11/19/22 1630 11/19/22 2021 11/20/22 0752 11/20/22 1139 11/20/22 1621  GLUCAP 150* 107* 102* 132* 133*   Lipid Profile: No results for input(s): "CHOL", "HDL", "LDLCALC", "TRIG", "CHOLHDL", "LDLDIRECT" in the last 72 hours. Thyroid Function Tests: No results for input(s): "TSH", "T4TOTAL", "FREET4", "T3FREE", "THYROIDAB" in the last 72 hours. Anemia Panel: No results for input(s): "VITAMINB12", "FOLATE", "FERRITIN", "TIBC", "IRON", "RETICCTPCT" in the last 72 hours. Sepsis Labs: No results for input(s): "PROCALCITON", "LATICACIDVEN" in the last 168 hours.  Recent Results (from the past 240 hour(s))  Resp panel by RT-PCR (RSV, Flu A&B, Covid) Anterior Nasal Swab     Status: None   Collection Time: 11/19/22  1:15 AM   Specimen:  Anterior Nasal Swab  Result Value Ref Range Status   SARS Coronavirus 2 by RT PCR NEGATIVE NEGATIVE Final    Comment: (NOTE) SARS-CoV-2 target nucleic acids are NOT DETECTED.  The SARS-CoV-2 RNA is generally detectable in upper respiratory specimens during the acute phase of infection. The lowest concentration of SARS-CoV-2 viral copies this assay can detect is 138 copies/mL. A negative result does not preclude SARS-Cov-2 infection and should not be used as the sole basis for treatment or other patient management decisions. A negative result may occur with  improper specimen collection/handling, submission of specimen other than nasopharyngeal swab, presence of viral mutation(s) within the areas targeted by this assay, and inadequate number of viral copies(<138 copies/mL). A negative result must be  combined with clinical observations, patient history, and epidemiological information. The expected result is Negative.  Fact Sheet for Patients:  BloggerCourse.com  Fact Sheet for Healthcare Providers:  SeriousBroker.it  This test is no t yet approved or cleared by the [place] FDA and  has been authorized for detection and/or diagnosis of SARS-CoV-2 by FDA under an Emergency Use Authorization (EUA). This EUA will remain  in effect (meaning this test can be used) for the duration of the COVID-19 declaration under Section 564(b)(1) of the Act, 21 U.S.C.section 360bbb-3(b)(1), unless the authorization is terminated  or revoked sooner.       Influenza A by PCR NEGATIVE NEGATIVE Final   Influenza B by PCR NEGATIVE NEGATIVE Final    Comment: (NOTE) The Xpert Xpress SARS-CoV-2/FLU/RSV plus assay is intended as an aid in the diagnosis of influenza from Nasopharyngeal swab specimens and should not be used as a sole basis for treatment. Nasal washings and aspirates are unacceptable for Xpert Xpress SARS-CoV-2/FLU/RSV testing.  Fact  Sheet for Patients: BloggerCourse.com  Fact Sheet for Healthcare Providers: SeriousBroker.it  This test is not yet approved or cleared by the [place] FDA and has been authorized for detection and/or diagnosis of SARS-CoV-2 by FDA under an Emergency Use Authorization (EUA). This EUA will remain in effect (meaning this test can be used) for the duration of the COVID-19 declaration under Section 564(b)(1) of the Act, 21 U.S.C. section 360bbb-3(b)(1), unless the authorization is terminated or revoked.     Resp Syncytial Virus by PCR NEGATIVE NEGATIVE Final    Comment: (NOTE) Fact Sheet for Patients: BloggerCourse.com  Fact Sheet for Healthcare Providers: SeriousBroker.it  This test is not yet approved or cleared by the [place] FDA and has been authorized for detection and/or diagnosis of SARS-CoV-2 by FDA under an Emergency Use Authorization (EUA). This EUA will remain in effect (meaning this test can be used) for the duration of the COVID-19 declaration under Section 564(b)(1) of the Act, 21 U.S.C. section 360bbb-3(b)(1), unless the authorization is terminated or revoked.  Performed at [facility], [address] [place], [place] [address]   Blood Culture (routine x 2)     Status: None (Preliminary result)   Collection Time: 11/19/22  3:22 AM   Specimen: BLOOD  Result Value Ref Range Status   Specimen Description   Final    BLOOD SITE NOT SPECIFIED Performed at [facility], [address], [place], [place] [address]    Special Requests   Final    Blood Culture adequate volume BOTTLES DRAWN AEROBIC AND ANAEROBIC Performed at [facility], [address] [place], [place] [address]    Culture   Final    NO GROWTH 1 DAY Performed at [facility], [address], [place], [place] [address]    Report Status PENDING   Incomplete  Blood Culture (routine x 2)     Status: None (Preliminary result)   Collection Time: 11/19/22  3:22 AM   Specimen: BLOOD  Result Value Ref Range Status   Specimen Description   Final    BLOOD SITE NOT SPECIFIED Performed at [facility], [address] [place], [place] [address]    Special Requests   Final    Blood Culture adequate volume BOTTLES DRAWN AEROBIC AND ANAEROBIC Performed at [facility], [address], [place], [place] [address]    Culture   Final    NO GROWTH 1 DAY Performed at  [facility], [address], [place], [place] [address]    Report Status PENDING  Incomplete    Radiology Studies: CT Angio Chest PE W and/or Wo Contrast  Result Date: 11/19/2022 CLINICAL DATA:  Shortness of breath with possible aspiration wall eating chicken wings. EXAM: CT ANGIOGRAPHY CHEST WITH CONTRAST TECHNIQUE: Multidetector CT imaging of the chest was performed using the standard protocol during bolus administration of intravenous contrast. Multiplanar CT image reconstructions and MIPs were obtained to evaluate the vascular anatomy. RADIATION DOSE REDUCTION: This exam was performed according to the departmental dose-optimization program which includes automated exposure control, adjustment of the mA and/or kV according to patient size and/or use of iterative reconstruction technique. CONTRAST:  75mL OMNIPAQUE IOHEXOL 350 MG/ML SOLN COMPARISON:  June 14, 2022 FINDINGS: Cardiovascular: The thoracic aorta is normal in appearance. Satisfactory opacification of the pulmonary arteries to the segmental level. No evidence of pulmonary embolism. Normal heart size. No pericardial effusion. Mediastinum/Nodes: There is mild paratracheal, right hilar and subcarinal lymphadenopathy. Thyroid gland, trachea, and esophagus demonstrate no significant findings. Lungs/Pleura: Moderate to marked severity multifocal infiltrates are seen throughout the right upper lobe, right  middle lobe and right lower lobe. Mild left upper lobe and left lower lobe multifocal infiltrates are also noted. Mild right-sided volume loss is seen. There is a very small right pleural effusion. No pneumothorax is identified. Upper Abdomen: A 3.5 cm diameter simple cyst is seen within the anterolateral aspect of the mid left kidney. Musculoskeletal: No chest wall abnormality. No acute or significant osseous findings. Review of the MIP images confirms the above findings. IMPRESSION: 1. No evidence of pulmonary embolism. 2. Moderate to marked severity multifocal infiltrates throughout the right upper lobe, right middle lobe and right lower lobe. 3. Mild left upper lobe and left lower lobe multifocal infiltrates. 4. Very small right pleural effusion. 5. Mild mediastinal and right hilar lymphadenopathy. 6. Simple left renal cyst. No follow-up imaging is recommended. This recommendation follows ACR consensus guidelines: Management of the Incidental Renal Mass on CT: A White Paper of the ACR Incidental Findings Committee. J Am Coll Radiol [id number]. Electronically Signed   By: Aram Candela M.D.   On: 11/19/2022 03:24   DG Chest Port 1 View  Result Date: 11/19/2022 CLINICAL DATA:  Shortness of breath and possible aspiration while eating. EXAM: PORTABLE CHEST 1 VIEW COMPARISON:  October 17, 2022 FINDINGS: The cardiac silhouette is mildly enlarged which may be, in part, technical in origin. Mild to moderate severity infiltrate is seen within the mid right lung and right lung base. Right-sided volume loss is also noted. Mild elevation of the right hemidiaphragm is again seen. No pleural effusion or pneumothorax is identified. The visualized skeletal structures are unremarkable. IMPRESSION: Mild to moderate severity right-sided infiltrate. Electronically Signed   By: Aram Candela M.D.   On: 11/19/2022 01:54    Scheduled Meds:  guaiFENesin  1,200 mg Oral BID   insulin aspart  0-20 Units Subcutaneous  TID WC   ipratropium-albuterol  3 mL Nebulization QID   sertraline  150 mg Oral Daily   torsemide  40 mg Oral BID   Continuous Infusions:  ampicillin-sulbactam (UNASYN) IV 3 g (11/20/22 1623)    LOS: 1 day   Marguerita Merles, DO [facility] Available via Epic secure chat 7am-7pm After these hours, please refer to coverage provider listed on [email or web address] 11/20/2022, 6:15 PM

## 2022-11-20 NOTE — Hospital Course (Addendum)
47 yo with h/o chronic diastolic CHF, depression/anxiety, morbid obesity, and OSA who presented ton 9/20 with dyspnea after he choked with a piece of chicken requiring the Heimlich maneuver.  He initially require NRB mask at 15 L/min but is now down to 4 LPM.  He has had a previous episode of aspiration in the past. CXR with mild to moderate severity right-sided infiltrate.  CTA chest no evidence of PE.  There is moderate to marked severity multifocal infiltrates throughout the right upper lobe, right middle lobe and right lower lobe.  Mild left upper lobe and left lower lobe multifocal infiltrates.  Very small right pleural effusion.  Mild mediastinal and right hilar lymphadenopathy.  On antibiotics with SLP evaluation. Repeat CXR reviewed by Pulmonary and they feel it is the same and that his RLL collapse is chronic. Continue O2 weaning as tolerated, currently on 1L.

## 2022-11-21 ENCOUNTER — Encounter: Payer: Self-pay | Admitting: Family Medicine

## 2022-11-21 ENCOUNTER — Inpatient Hospital Stay (HOSPITAL_COMMUNITY): Payer: MEDICAID

## 2022-11-21 DIAGNOSIS — J690 Pneumonitis due to inhalation of food and vomit: Secondary | ICD-10-CM | POA: Diagnosis not present

## 2022-11-21 DIAGNOSIS — J9601 Acute respiratory failure with hypoxia: Secondary | ICD-10-CM | POA: Diagnosis not present

## 2022-11-21 DIAGNOSIS — R7303 Prediabetes: Secondary | ICD-10-CM | POA: Diagnosis not present

## 2022-11-21 DIAGNOSIS — I5032 Chronic diastolic (congestive) heart failure: Secondary | ICD-10-CM | POA: Diagnosis not present

## 2022-11-21 LAB — CBC WITH DIFFERENTIAL/PLATELET
Abs Immature Granulocytes: 0.07 10*3/uL (ref 0.00–0.07)
Basophils Absolute: 0 10*3/uL (ref 0.0–0.1)
Basophils Relative: 0 %
Eosinophils Absolute: 0.4 10*3/uL (ref 0.0–0.5)
Eosinophils Relative: 4 %
HCT: 38.5 % — ABNORMAL LOW (ref 39.0–52.0)
Hemoglobin: 12.2 g/dL — ABNORMAL LOW (ref 13.0–17.0)
Immature Granulocytes: 1 %
Lymphocytes Relative: 8 %
Lymphs Abs: 0.7 10*3/uL (ref 0.7–4.0)
MCH: 26.2 pg (ref 26.0–34.0)
MCHC: 31.7 g/dL (ref 30.0–36.0)
MCV: 82.6 fL (ref 80.0–100.0)
Monocytes Absolute: 0.8 10*3/uL (ref 0.1–1.0)
Monocytes Relative: 9 %
Neutro Abs: 6.9 10*3/uL (ref 1.7–7.7)
Neutrophils Relative %: 78 %
Platelets: 266 10*3/uL (ref 150–400)
RBC: 4.66 MIL/uL (ref 4.22–5.81)
RDW: 14.6 % (ref 11.5–15.5)
WBC: 9 10*3/uL (ref 4.0–10.5)
nRBC: 0 % (ref 0.0–0.2)

## 2022-11-21 LAB — CG4 I-STAT (LACTIC ACID): Lactic Acid, Venous: 1.3 mmol/L (ref 0.5–1.9)

## 2022-11-21 LAB — GLUCOSE, CAPILLARY
Glucose-Capillary: 102 mg/dL — ABNORMAL HIGH (ref 70–99)
Glucose-Capillary: 113 mg/dL — ABNORMAL HIGH (ref 70–99)
Glucose-Capillary: 128 mg/dL — ABNORMAL HIGH (ref 70–99)
Glucose-Capillary: 120 mg/dL — ABNORMAL HIGH (ref 70–99)

## 2022-11-21 LAB — COMPREHENSIVE METABOLIC PANEL
ALT: 14 U/L (ref 0–44)
AST: 11 U/L — ABNORMAL LOW (ref 15–41)
Albumin: 3.3 g/dL — ABNORMAL LOW (ref 3.5–5.0)
Alkaline Phosphatase: 65 U/L (ref 38–126)
Anion gap: 10 (ref 5–15)
BUN: 15 mg/dL (ref 6–20)
CO2: 29 mmol/L (ref 22–32)
Calcium: 7.8 mg/dL — ABNORMAL LOW (ref 8.9–10.3)
Chloride: 100 mmol/L (ref 98–111)
Creatinine, Ser: 1.01 mg/dL (ref 0.61–1.24)
GFR, Estimated: >60 mL/min (ref >60)
Glucose, Bld: 118 mg/dL — ABNORMAL HIGH (ref 70–99)
Potassium: 3.5 mmol/L (ref 3.5–5.1)
Sodium: 139 mmol/L (ref 135–145)
Total Bilirubin: 1.1 mg/dL (ref 0.3–1.2)
Total Protein: 6.7 g/dL (ref 6.5–8.1)

## 2022-11-21 LAB — IRON AND TIBC
Iron: 32 ug/dL — ABNORMAL LOW (ref 45–182)
Saturation Ratios: 12 % — ABNORMAL LOW (ref 17.9–39.5)
TIBC: 269 ug/dL (ref 250–450)
UIBC: 237 ug/dL

## 2022-11-21 LAB — RETICULOCYTES
Immature Retic Fract: 13.3 % (ref 2.3–15.9)
RBC.: 4.64 MIL/uL (ref 4.22–5.81)
Retic Count, Absolute: 52 10*3/uL (ref 19.0–186.0)
Retic Ct Pct: 1.1 % (ref 0.4–3.1)

## 2022-11-21 LAB — MAGNESIUM: Magnesium: 2 mg/dL (ref 1.7–2.4)

## 2022-11-21 LAB — PHOSPHORUS: Phosphorus: 4.9 mg/dL — ABNORMAL HIGH (ref 2.5–4.6)

## 2022-11-21 LAB — VITAMIN B12: Vitamin B-12: 529 pg/mL (ref 180–914)

## 2022-11-21 LAB — FERRITIN: Ferritin: 138 ng/mL (ref 24–336)

## 2022-11-21 LAB — FOLATE: Folate: 14.5 ng/mL (ref >5.9)

## 2022-11-21 MED ORDER — BUDESONIDE 0.25 MG/2ML IN SUSP
0.2500 mg | Freq: Two times a day (BID) | RESPIRATORY_TRACT | Status: DC
  Administered 2022-11-21 – 2022-11-28 (×15): 0.25 mg via RESPIRATORY_TRACT
  Filled 2022-11-21 (×15): qty 2

## 2022-11-21 MED ORDER — ADULT MULTIVITAMIN W/MINERALS CH
1.0000 | ORAL_TABLET | Freq: Every day | ORAL | Status: DC
  Administered 2022-11-21 – 2022-11-28 (×8): 1 via ORAL
  Filled 2022-11-21 (×8): qty 1

## 2022-11-21 MED ORDER — ARFORMOTEROL TARTRATE 15 MCG/2ML IN NEBU
15.0000 ug | INHALATION_SOLUTION | Freq: Two times a day (BID) | RESPIRATORY_TRACT | Status: DC
  Administered 2022-11-21 – 2022-11-28 (×15): 15 ug via RESPIRATORY_TRACT
  Filled 2022-11-21 (×15): qty 2

## 2022-11-21 NOTE — Plan of Care (Signed)
  Problem: Education: Goal: Ability to describe self-care measures that may prevent or decrease complications (Diabetes Survival Skills Education) will improve Outcome: Progressing Goal: Individualized Educational Video(s) Outcome: Progressing   Problem: Coping: Goal: Ability to adjust to condition or change in health will improve Outcome: Progressing   Problem: Fluid Volume: Goal: Ability to maintain a balanced intake and output will improve Outcome: Progressing   Problem: Health Behavior/Discharge Planning: Goal: Ability to identify and utilize available resources and services will improve Outcome: Progressing   Problem: Metabolic: Goal: Ability to maintain appropriate glucose levels will improve Outcome: Progressing   Problem: Nutritional: Goal: Maintenance of adequate nutrition will improve Outcome: Progressing   Problem: Skin Integrity: Goal: Risk for impaired skin integrity will decrease Outcome: Progressing   Problem: Education: Goal: Knowledge of General Education information will improve Description: Including pain rating scale, medication(s)/side effects and non-pharmacologic comfort measures Outcome: Progressing   Problem: Health Behavior/Discharge Planning: Goal: Ability to manage health-related needs will improve Outcome: Progressing   Problem: Clinical Measurements: Goal: Ability to maintain clinical measurements within normal limits will improve Outcome: Progressing Goal: Diagnostic test results will improve Outcome: Progressing   Problem: Activity: Goal: Risk for activity intolerance will decrease Outcome: Progressing   Problem: Safety: Goal: Ability to remain free from injury will improve Outcome: Progressing

## 2022-11-21 NOTE — Progress Notes (Signed)
Initial Nutrition Assessment  DOCUMENTATION CODES:   Morbid obesity  INTERVENTION:   -Continue carb modified diet -MVI with minerals daily -Double protein portions with meals -RD will refer to [facility] for further support and reinforcement   NUTRITION DIAGNOSIS:   Increased nutrient needs related to chronic illness (CHF) as evidenced by estimated needs.  GOAL:   Patient will meet greater than or equal to 90% of their needs  MONITOR:   PO intake, Supplement acceptance  REASON FOR ASSESSMENT:   Consult Assessment of nutrition requirement/status, Diet education  ASSESSMENT:   Pt with medical history significant of allergic rhinitis, anxiety, chronic diastolic CHF, depression, herpes zoster, obesity, pleural effusion, sleep apnea who presented with complaints of dyspnea after he choked with a piece of chicken requiring the Heimlich maneuver.  Pt admitted with acute respiratory failure with hypoxemia related to aspiration pneumonia and asthma.   Reviewed I/O's: -3.5 L x 24 hours and -1.2 L since admission  UOP: 5.7 L x 24 hours   Pt unavailable at time of visit. Attempted to speak with pt via call to hospital room phone, however, unable to reach. RD unable to obtain further nutrition-related history or complete nutrition-focused physical exam at this time.    Pt currently on a carb modified diet. Meal completions documented at 100%.   Reviewed wt hx; no wt loss noted over the past 3 months.   Pt with no history of DM, but with pre-DM.   Obesity is a complex, chronic medical condition that is optimally managed by a multidisciplinary care team. Weight loss is not an ideal goal for an acute inpatient hospitalization. However, if further work-up for obesity is warranted, consider outpatient referral to 's Nutrition [facility] Diabetes Education [facility].     Medications reviewed and include demadex and unasyn.   Lab Results   Component Value Date   HGBA1C 5.8 (H) 11/19/2022   PTA DM medications are none.   Labs reviewed: CBGS: 102-133 (inpatient orders for glycemic control are 0-20 units insulin aspart TID with meals).    Diet Order:   Diet Order             Diet Carb Modified Fluid consistency: Thin; Room service appropriate? Yes  Diet effective now                   EDUCATION NEEDS:   No education needs have been identified at this time  Skin:  Skin Assessment: Reviewed RN Assessment  Last BM:  11/20/22  Height:   Ht Readings from Last 1 Encounters:  10/17/22 6\' 1"  (1.854 m)    Weight:   Wt Readings from Last 1 Encounters:  11/21/22 (!) 189.2 kg    Ideal Body Weight:  83.6 kg  BMI:  Body mass index is 55.04 kg/m.  Estimated Nutritional Needs:   Kcal:  2100-2300  Protein:  105-120 grams  Fluid:  2-2.2 L    Levada Schilling, RD, LDN, CDCES Registered Dietitian II Certified Diabetes Care and Education Specialist Please refer to [facility] for RD and/or RD on-call/weekend/after hours pager

## 2022-11-21 NOTE — Progress Notes (Signed)
Established Patient Office Visit  Subjective    Patient ID: Christian [name], male    DOB: 1975-08-26  Age: 47 y.o. MRN: [id number]  CC:  Chief Complaint  Patient presents with   Hospitalization Follow-up    HPI Christian [name] presents for routine follow up of chronic med issues  Facility-Administered Encounter Medications as of 11/17/2022  Medication   cholecalciferol (VITAMIN D3) tablet 1,000 Units   Outpatient Encounter Medications as of 11/17/2022  Medication Sig   albuterol (VENTOLIN HFA) 108 (90 Base) MCG/ACT inhaler Inhale 2 puffs into the lungs every 6 (six) hours as needed for wheezing or shortness of breath.   fluticasone (FLONASE) 50 MCG/ACT nasal spray Spray 2 sprays into both nostrils once daily. (Patient taking differently: Place 2 sprays into both nostrils daily as needed for allergies.)   fluticasone-salmeterol (ADVAIR HFA) 115-21 MCG/ACT inhaler Inhale 2 puffs into the lungs 2 (two) times daily.   meloxicam (MOBIC) 15 MG tablet Take 1 tablet (15 mg total) by mouth daily.   OXYGEN Inhale 2 L/min into the lungs at bedtime. (Patient not taking: Reported on 11/19/2022)   sertraline (ZOLOFT) 100 MG tablet Take 1 and 1/2 tablet (150 mg total) by mouth daily.   torsemide (DEMADEX) 20 MG tablet Take 2 tablets (40 mg total) by mouth 2 (two) times daily.   traZODone (DESYREL) 50 MG tablet Take 1 tablet (50 mg total) by mouth at bedtime.   vitamin B-12 (CYANOCOBALAMIN) 100 MCG tablet Take 1 tablet (100 mcg total) by mouth once daily. (Patient not taking: Reported on 11/19/2022)   [DISCONTINUED] ciprofloxacin-dexamethasone (CIPRODEX) OTIC suspension Place 4 drops into the right ear 2 (two) times daily.   [DISCONTINUED] meloxicam (MOBIC) 15 MG tablet Take 1 tablet (15 mg total) by mouth daily.   [DISCONTINUED] nirmatrelvir/ritonavir (PAXLOVID) 20 x 150 MG & 10 x 100MG  TABS Take as directed on package   [DISCONTINUED] torsemide (DEMADEX) 20 MG tablet Take 2 tablets (40 mg total)  by mouth 2 (two) times daily.    Past Medical History:  Diagnosis Date   Allergic rhinitis    Anxiety    CHF (congestive heart failure) (HCC)    Depression    Depression    Elevated blood pressure reading without diagnosis of hypertension    History of chicken pox    Obesity    Pleural effusion    Sepsis (HCC)    Sleep apnea    O2 at night    Past Surgical History:  Procedure Laterality Date   COLON SURGERY     perforated bowel   TONSILLECTOMY AND ADENOIDECTOMY  03/01/1983    Family History  Problem Relation Age of Onset   Stroke Mother    Diabetes Mother        pre-diabetes   Heart disease Mother        CHF, pacer   Cancer Mother        lymphoma   Stomach cancer Mother    Clotting disorder Mother    Coronary artery disease Father 105       MI   Hypertension Father    Hyperlipidemia Father    Diabetes Father    Heart attack Father    Migraines Sister    Stroke Paternal Grandmother    Diabetes Paternal Grandmother    Heart disease Paternal Grandfather    Parkinson's disease Paternal Grandfather    Heart attack Paternal Grandfather     Social History   Socioeconomic History   Marital  status: Single    Spouse name: Not on file   Number of children: 0   Years of education: Not on file   Highest education level: 12th grade  Occupational History   Not on file  Tobacco Use   Smoking status: Never   Smokeless tobacco: Never   Tobacco comments:    Living in homeless shelter  Vaping Use   Vaping status: Never Used  Substance and Sexual Activity   Alcohol use: Yes    Alcohol/week: 2.0 standard drinks of alcohol    Types: 2 Cans of beer per week    Comment: 1-2 beers 1-2 days a week   Drug use: Never   Sexual activity: Not Currently  Other Topics Concern   Not on file  Social History Narrative   Caffeine: occasional, lives at a shelter [employer])  Occupation: works at [employer] part timeEdu: some college Diet: some water, some  fruits/vegetables, 1-2x/wk red meat, fish 1x/wk   Social Determinants of Health   Financial Resource Strain: High Risk (07/01/2022)   Overall Financial Resource Strain (CARDIA)    Difficulty of Paying Living Expenses: Very hard  Food Insecurity: Food Insecurity Present (11/19/2022)   Hunger Vital Sign    Worried About Running Out of Food in the Last Year: Sometimes true    Ran Out of Food in the Last Year: Sometimes true  Transportation Needs: Unmet Transportation Needs (11/19/2022)   PRAPARE - Transportation    Lack of Transportation (Medical): Yes    Lack of Transportation (Non-Medical): Yes  Physical Activity: Inactive (07/01/2022)   Exercise Vital Sign    Days of Exercise per Week: 0 days    Minutes of Exercise per Session: 20 min  Stress: Stress Concern Present (07/01/2022)   [employer] of Occupational Health - Occupational Stress Questionnaire    Feeling of Stress : To some extent  Social Connections: Moderately Integrated (07/01/2022)   Social Connection and Isolation Panel [NHANES]    Frequency of Communication with Friends and Family: More than three times a week    Frequency of Social Gatherings with Friends and Family: Once a week    Attends Religious Services: More than 4 times per year    Active Member of [employer] or Organizations: Yes    Attends Banker Meetings: 1 to 4 times per year    Marital Status: Never married  Recent Concern: Social Connections - Moderately Isolated (05/11/2022)   Social Connection and Isolation Panel [NHANES]    Frequency of Communication with Friends and Family: More than three times a week    Frequency of Social Gatherings with Friends and Family: Twice a week    Attends Religious Services: More than 4 times per year    Active Member of [employer] or Organizations: No    Attends Banker Meetings: Never    Marital Status: Never married  Intimate Partner Violence: Not At Risk (11/19/2022)   Humiliation, Afraid, Rape, and Kick  questionnaire    Fear of Current or Ex-Partner: No    Emotionally Abused: No    Physically Abused: No    Sexually Abused: No    Review of Systems  All other systems reviewed and are negative.       Objective    BP 131/85   Pulse 72   Temp 98.1 F (36.7 C) (Oral)   Resp 20   Wt (!) 408 lb 3.2 oz (185.2 kg)   SpO2 98%   BMI 53.86 kg/m  Physical Exam Vitals and nursing note reviewed.  Constitutional:      General: He is not in acute distress.    Appearance: He is obese.  Cardiovascular:     Rate and Rhythm: Normal rate and regular rhythm.  Pulmonary:     Effort: Pulmonary effort is normal. No respiratory distress.     Breath sounds: Normal breath sounds. No wheezing.  Abdominal:     Palpations: Abdomen is soft.     Tenderness: There is no abdominal tenderness.  Musculoskeletal:     Right lower leg: No edema.     Left lower leg: No edema.  Neurological:     General: No focal deficit present.     Mental Status: He is alert and oriented to person, place, and time.  Psychiatric:        Mood and Affect: Mood and affect normal.        Speech: Speech normal.        Behavior: Behavior normal. Behavior is cooperative.         Assessment & Plan:   Essential hypertension  Chronic diastolic CHF (congestive heart failure) (HCC)  Encounter for immunization -     Flu vaccine trivalent PF, 6mos and older(Flulaval,Afluria,Fluarix,Fluzone)  Homeless  Depression, unspecified depression type  Class 3 severe obesity due to excess calories with serious comorbidity and body mass index (BMI) of 50.0 to 59.9 in adult [facility])  Edema, unspecified type  Other orders -     Torsemide; Take 2 tablets (40 mg total) by mouth 2 (two) times daily.  Dispense: 120 tablet; Refill: 2 -     Meloxicam; Take 1 tablet (15 mg total) by mouth daily.  Dispense: 30 tablet; Refill: 2     Return in about 3 months (around 02/16/2023) for follow up.   Tommie Raymond, MD

## 2022-11-21 NOTE — Progress Notes (Signed)
PROGRESS NOTE    Christian [name]  TKZ:[id number] DOB: 09-Jun-1975 DOA: 11/18/2022 PCP: [name], MD   Brief Narrative:  HPI per Dr. Sanda Klein on 11/19/22  Christian [name] is a 47 y.o. male with medical history significant of allergic rhinitis, anxiety, chronic diastolic CHF, depression, herpes zoster, obesity, pleural effusion, sleep apnea who presented to the emergency department with complaints of dyspnea after he choked with a piece of chicken requiring the Heimlich maneuver.  He initially require NRB mask at 15 L/min but is now down to 4 LPM.  He has had a previous episode of aspiration in the past. He had wheezing and productive cough afterwards.  Denied fever, chills, rhinorrhea, sore throat or hemoptysis.  No chest pain, palpitations, diaphoresis, PND, orthopnea or but has frequent pitting edema of the lower extremities.  No abdominal pain, nausea, emesis, diarrhea, constipation, melena or hematochezia.  No flank pain, dysuria, frequency or hematuria.  No polyuria, polydipsia, polyphagia or blurred vision.   Lab work: Urinalysis was normal.  Negative coronavirus, influenza and RSV PCR.  Normal PT, INR and PTT.  CBCs are white count 11.2, hemoglobin 13.3 g/dL and platelets 573.  Troponin x 2 and BNP were normal.   Imaging: Portable 1 view chest radiograph with mild to moderate severity right-sided infiltrate.  CTA chest no evidence of PE.  There is moderate to marked severity multifocal infiltrates throughout the right upper lobe, right middle lobe and right lower lobe.  Mild left upper lobe and left lower lobe multifocal infiltrates.  Very small right pleural effusion.  Mild mediastinal and right hilar lymphadenopathy.  Simple left renal cysts with no follow-up recommended.   ED course: Initial vital signs were temperature 97.5 F, pulse 110, respirations 24, BP 142/99 mmHg O2 sat 92% on nonrebreather mask.  The patient received acetaminophen 650 mg p.o., ceftriaxone 2 g IVPB,  metronidazole 500 mg IVPB and 2500 mL LR bolus.  **Interim History Patient continues to be a little bit dyspneic but imaging showed no evidence of the PE but did show moderate to mild severity right-sided infiltrate.  Will continue antibiotics and obtain SLP evaluation. Repeat CXR reviewed by Pulmonary and they feel it is the same and that his RLL collapse is chronic. Continue O2 weaning and Treatment. Will also repeat chest x-ray in a.m. and continue to adjust his breathing regimen as he is slowly improving. Will continue to Wean Supplemental O2 via [place].  Assessment and Plan:  Acute Respiratory Failure with Hypoxemia (HCC)in the setting of  Aspiration Pneumonia (HCC) Superimposed on Asthma -Admit to telemetry/inpatient. -Continue supplemental oxygen. SpO2: 92 % O2 Flow Rate (L/min): 4 L/min -Scheduled and as needed bronchodilators. -No glucocorticoids. -Continue Unasyn 3 g IVPB every 6 hours. -Check strep pneumoniae urinary antigen. -Check sputum Gram stain, culture and sensitivity. -Follow-up blood culture and sensitivity. -Aspiration Precautions -Continue supplemental oxygen via nasal cannula wean O2 as tolerated -Continuous pulse oximetry maintain O2 saturation greater 90% -SLP Evaluation done and apparently patient inhales his fluids and rapidly eats given that he wants to eat more food at the shelter if further close -Add Brovana and budesonide nebs -Add flutter valve, incentive spirometry and guaifenesin 1200 p.o. twice daily -Repeat CXR done this a.m. and showed "Persistent infiltrate and collapse of the right lower lobe and possibly right middle lobe. Some clearing and better aeration of the right upper lobe. No worsening or new finding." -If patient does not continue to improve and worsens will need formal Pulmonary evaluation  Essential Hypertension -On Torsemide 40 mg p.o. twice daily. -Continue Monitor BP, GFR and electrolytes. -Last blood pressure reading was 125/68    Chronic diastolic CHF (congestive heart failure) (HCC) -Euvolemic No signs of volume overload. -Resume torsemide 40 mg p.o. twice daily -Might benefit from an ACE inhibitor and/or beta-blocker -Strict I's and O's and daily weights;  Intake/Output Summary (Last 24 hours) at 11/21/2022 1623 Last data filed at 11/21/2022 1300 Gross per 24 hour  Intake 780 ml  Output 4800 ml  Net -4020 ml  -Continue to monitor for signs or symptoms of volume overload   Depression -Continue Sertraline 100 mg p.o. daily and Trazodone 100 mg p.o. at bedtime as needed.   PreDiabetes -Carbohydrate modified diet. -CBG monitoring with RI SS. -Check hemoglobin A1c. -CBG and Glucose Trend: Recent Labs  Lab 11/19/22 2021 11/20/22 0752 11/20/22 1139 11/20/22 1621 11/20/22 2145 11/21/22 0739 11/21/22 1133  GLUCAP 107* 102* 132* 133* 119* 102* 113*   Recent Labs  Lab 11/19/22 0055 11/20/22 0626 11/21/22 0525  GLUCOSE 142* 91 118*    Mild Protein Malnutrition (HCC) -Needs to decrease carbohydrate intake. -Will consult Dietitian for further evaluation, assessment and recommendations  Normocytic Anemia -Hgb/Hct Trend: Recent Labs  Lab 11/19/22 0055 11/20/22 0626 11/21/22 0525  HGB 13.3 12.4* 12.2*  HCT 41.8 38.7* 38.5*  MCV 82.6 83.8 82.6  -Checked Anemia Panel and it showed an iron level of 32, UIBC of 237, TIBC of 269, saturation ratios of 12%, ferritin level 138, folate of 14.5 and vitamin B12 529 -Continue to Monitor for S/Sx of Bleeding; No overt bleeding noted -Repeat CBC in the AM   Hypoalbuminemia -Patient's Albumin Trend: Recent Labs  Lab 11/19/22 0055 11/20/22 0626 11/21/22 0525  ALBUMIN 3.2* 3.1* 3.3*  -Continue to Monitor and Trend and repeat CMP in the AM  Super Morbid Obesity -Complicates overall prognosis and care -Estimated body mass index is 55.04 kg/m as calculated from the following:   Height as of 10/17/22: 6\' 1"  (1.854 m).   Weight as of this encounter: 189.2  kg.  -Weight Loss and Dietary Counseling given with Lifestyle modifications. -Follow-up with closely PCP and/or bariatric clinic.   DVT prophylaxis: SCDs Start: 11/19/22 0558    Code Status: Full Code Family Communication: Friend at bedside  Disposition Plan:  Level of care: Telemetry Status is: Inpatient Remains inpatient appropriate because: Needs further clinical improvement in his Respiratory Status   Consultants:  None  Procedures:  None   Antimicrobials:  Anti-infectives (From admission, onward)    Start     Dose/Rate Route Frequency Ordered Stop   11/19/22 1600  Ampicillin-Sulbactam (UNASYN) 3 g in sodium chloride 0.9 % 100 mL IVPB        3 g 200 mL/hr over 30 Minutes Intravenous Every 6 hours 11/19/22 0611     11/19/22 0345  metroNIDAZOLE (FLAGYL) IVPB 500 mg        500 mg 100 mL/hr over 60 Minutes Intravenous  Once 11/19/22 0341 11/19/22 0526   11/19/22 0245  cefTRIAXone (ROCEPHIN) 2 g in sodium chloride 0.9 % 100 mL IVPB        2 g 200 mL/hr over 30 Minutes Intravenous  Once 11/19/22 0231 11/19/22 0353       Subjective: Seen and examined at bedside and thinks he is doing little bit better respiratory wise.  No nausea or vomiting.  SLP evaluated and feel that the reason he aspirated because he eats too quickly and very rapidly.  No chest  pain or shortness breath.  No other concerns or questions time.  Objective: Vitals:   11/21/22 0920 11/21/22 1215 11/21/22 1237 11/21/22 1600  BP:   125/68   Pulse:   81   Resp:   18   Temp:   98.1 F (36.7 C)   TempSrc:      SpO2: 92% 90% 95% 92%  Weight:        Intake/Output Summary (Last 24 hours) at 11/21/2022 1725 Last data filed at 11/21/2022 1300 Gross per 24 hour  Intake 780 ml  Output 4800 ml  Net -4020 ml   Filed Weights   11/20/22 0232 11/21/22 0500  Weight: (!) 188.4 kg (!) 189.2 kg   Examination: Physical Exam:  Constitutional: WN/WD super morbidly obese Caucasian male in no acute  distress Respiratory: Diminished to auscultation bilaterally, no wheezing, rales, rhonchi or crackles. Normal respiratory effort and patient is not tachypenic. No accessory muscle use.  Unlabored breathing Cardiovascular: RRR, no murmurs / rubs / gallops. S1 and S2 auscultated. No extremity edema.  Abdomen: Soft, non-tender, distended secondary to body habitus. Bowel sounds positive.  GU: Deferred. Musculoskeletal: No clubbing / cyanosis of digits/nails. No joint deformity upper and lower extremities.  Skin: No rashes, lesions, ulcers on limited skin evaluation. No induration; Warm and dry.  Neurologic: CN 2-12 grossly intact with no focal deficits. Romberg sign and cerebellar reflexes not assessed.  Psychiatric: Normal judgment and insight. Alert and oriented x 3. Normal mood and appropriate affect.   Data Reviewed: I have personally reviewed following labs and imaging studies  CBC: Recent Labs  Lab 11/19/22 0055 11/20/22 0626 11/21/22 0525  WBC 11.2* 9.0 9.0  NEUTROABS  --   --  6.9  HGB 13.3 12.4* 12.2*  HCT 41.8 38.7* 38.5*  MCV 82.6 83.8 82.6  PLT 306 235 266   Basic Metabolic Panel: Recent Labs  Lab 11/19/22 0055 11/20/22 0626 11/21/22 0525  NA 138 141 139  K 4.0 3.8 3.5  CL 106 107 100  CO2 25 25 29   GLUCOSE 142* 91 118*  BUN 20 12 15   CREATININE 0.98 0.73 1.01  CALCIUM 8.1* 8.4* 7.8*  MG  --  2.2 2.0  PHOS  --  3.3 4.9*   GFR: Estimated Creatinine Clearance: 156.4 mL/min (by C-G formula based on SCr of 1.01 mg/dL). Liver Function Tests: Recent Labs  Lab 11/19/22 0055 11/20/22 0626 11/21/22 0525  AST 14* 11* 11*  ALT 14 13 14   ALKPHOS 75 68 65  BILITOT 0.8 1.0 1.1  PROT 5.8* 6.2* 6.7  ALBUMIN 3.2* 3.1* 3.3*   No results for input(s): "LIPASE", "AMYLASE" in the last 168 hours. No results for input(s): "AMMONIA" in the last 168 hours. Coagulation Profile: Recent Labs  Lab 11/19/22 0322  INR 1.1   Cardiac Enzymes: No results for input(s):  "CKTOTAL", "CKMB", "CKMBINDEX", "TROPONINI" in the last 168 hours. BNP (last 3 results) No results for input(s): "PROBNP" in the last 8760 hours. HbA1C: Recent Labs    11/19/22 1220  HGBA1C 5.8*   CBG: Recent Labs  Lab 11/20/22 1621 11/20/22 2145 11/21/22 0739 11/21/22 1133 11/21/22 1628  GLUCAP 133* 119* 102* 113* 128*   Lipid Profile: No results for input(s): "CHOL", "HDL", "LDLCALC", "TRIG", "CHOLHDL", "LDLDIRECT" in the last 72 hours. Thyroid Function Tests: No results for input(s): "TSH", "T4TOTAL", "FREET4", "T3FREE", "THYROIDAB" in the last 72 hours. Anemia Panel: Recent Labs    11/21/22 0525  VITAMINB12 529  FOLATE 14.5  FERRITIN 138  TIBC 269  IRON 32*  RETICCTPCT 1.1   Sepsis Labs: Recent Labs  Lab 11/19/22 0332  LATICACIDVEN 1.3    Recent Results (from the past 240 hour(s))  Resp panel by RT-PCR (RSV, Flu A&B, Covid) Anterior Nasal Swab     Status: None   Collection Time: 11/19/22  1:15 AM   Specimen: Anterior Nasal Swab  Result Value Ref Range Status   SARS Coronavirus 2 by RT PCR NEGATIVE NEGATIVE Final    Comment: (NOTE) SARS-CoV-2 target nucleic acids are NOT DETECTED.  The SARS-CoV-2 RNA is generally detectable in upper respiratory specimens during the acute phase of infection. The lowest concentration of SARS-CoV-2 viral copies this assay can detect is 138 copies/mL. A negative result does not preclude SARS-Cov-2 infection and should not be used as the sole basis for treatment or other patient management decisions. A negative result may occur with  improper specimen collection/handling, submission of specimen other than nasopharyngeal swab, presence of viral mutation(s) within the areas targeted by this assay, and inadequate number of viral copies(<138 copies/mL). A negative result must be combined with clinical observations, patient history, and epidemiological information. The expected result is Negative.  Fact Sheet for Patients:   BloggerCourse.com  Fact Sheet for Healthcare Providers:  SeriousBroker.it  This test is no t yet approved or cleared by the [place] FDA and  has been authorized for detection and/or diagnosis of SARS-CoV-2 by FDA under an Emergency Use Authorization (EUA). This EUA will remain  in effect (meaning this test can be used) for the duration of the COVID-19 declaration under Section 564(b)(1) of the Act, 21 U.S.C.section 360bbb-3(b)(1), unless the authorization is terminated  or revoked sooner.       Influenza A by PCR NEGATIVE NEGATIVE Final   Influenza B by PCR NEGATIVE NEGATIVE Final    Comment: (NOTE) The Xpert Xpress SARS-CoV-2/FLU/RSV plus assay is intended as an aid in the diagnosis of influenza from Nasopharyngeal swab specimens and should not be used as a sole basis for treatment. Nasal washings and aspirates are unacceptable for Xpert Xpress SARS-CoV-2/FLU/RSV testing.  Fact Sheet for Patients: BloggerCourse.com  Fact Sheet for Healthcare Providers: SeriousBroker.it  This test is not yet approved or cleared by the [place] FDA and has been authorized for detection and/or diagnosis of SARS-CoV-2 by FDA under an Emergency Use Authorization (EUA). This EUA will remain in effect (meaning this test can be used) for the duration of the COVID-19 declaration under Section 564(b)(1) of the Act, 21 U.S.C. section 360bbb-3(b)(1), unless the authorization is terminated or revoked.     Resp Syncytial Virus by PCR NEGATIVE NEGATIVE Final    Comment: (NOTE) Fact Sheet for Patients: BloggerCourse.com  Fact Sheet for Healthcare Providers: SeriousBroker.it  This test is not yet approved or cleared by the [place] FDA and has been authorized for detection and/or diagnosis of SARS-CoV-2 by FDA under an Emergency Use  Authorization (EUA). This EUA will remain in effect (meaning this test can be used) for the duration of the COVID-19 declaration under Section 564(b)(1) of the Act, 21 U.S.C. section 360bbb-3(b)(1), unless the authorization is terminated or revoked.  Performed at [facility], [address], [place], [place] [address]   Blood Culture (routine x 2)     Status: None (Preliminary result)   Collection Time: 11/19/22  3:22 AM   Specimen: BLOOD  Result Value Ref Range Status   Specimen Description   Final    BLOOD SITE NOT SPECIFIED Performed at  [facility], [address] [place], [place] [address]    Special Requests   Final    Blood Culture adequate volume BOTTLES DRAWN AEROBIC AND ANAEROBIC Performed at [facility], [address] [place], [place] [address]    Culture   Final    NO GROWTH 2 DAYS Performed at [facility], [address], [place], [place] [address]    Report Status PENDING  Incomplete  Blood Culture (routine x 2)     Status: None (Preliminary result)   Collection Time: 11/19/22  3:22 AM   Specimen: BLOOD  Result Value Ref Range Status   Specimen Description   Final    BLOOD SITE NOT SPECIFIED Performed at [facility], [address], [place], [place] [address]    Special Requests   Final    Blood Culture adequate volume BOTTLES DRAWN AEROBIC AND ANAEROBIC Performed at [facility], [address] [place], [place] [address]    Culture   Final    NO GROWTH 2 DAYS Performed at [facility], [address], [place], [place] [address]    Report Status PENDING  Incomplete    Radiology Studies: DG CHEST PORT 1 VIEW  Result Date: 11/21/2022 CLINICAL DATA:  Shortness of breath.  Aspiration pneumonia. EXAM: PORTABLE CHEST 1 VIEW COMPARISON:  11/19/2022 FINDINGS: Mild patchy density persists at the left base. Infiltrate in collapse of the right lower lobe and possibly right middle  lobe as seen previously. Some clearing and better aeration of the right upper lobe. No worsening or new finding. IMPRESSION: Persistent infiltrate and collapse of the right lower lobe and possibly right middle lobe. Some clearing and better aeration of the right upper lobe. No worsening or new finding. Electronically Signed   By: Paulina Fusi M.D.   On: 11/21/2022 07:37    Scheduled Meds:  arformoterol  15 mcg Nebulization BID   budesonide (PULMICORT) nebulizer solution  0.25 mg Nebulization BID   guaiFENesin  1,200 mg Oral BID   insulin aspart  0-20 Units Subcutaneous TID WC   ipratropium-albuterol  3 mL Nebulization QID   multivitamin with minerals  1 tablet Oral Daily   sertraline  150 mg Oral Daily   torsemide  40 mg Oral BID   Continuous Infusions:  ampicillin-sulbactam (UNASYN) IV 3 g (11/21/22 1704)    LOS: 2 days   Marguerita Merles, DO [facility] Available via Epic secure chat 7am-7pm After these hours, please refer to coverage provider listed on [email or web address] 11/21/2022, 5:25 PM

## 2022-11-21 NOTE — Progress Notes (Signed)
Mobility Specialist - Progress Note   11/21/22 0831  Mobility  Activity Transferred from chair to bed  Level of Assistance Contact guard assist, steadying assist  Assistive Device None  Distance Ambulated (ft) 6 ft  Range of Motion/Exercises Active  Activity Response Tolerated well  Mobility Referral Yes  $Mobility charge 1 Mobility  Mobility Specialist Start Time (ACUTE ONLY) [phone]  Mobility Specialist Stop Time (ACUTE ONLY) 0831  Mobility Specialist Time Calculation (min) (ACUTE ONLY) 8 min   Pt received in bed and agreed to mobility, declined hallway since breakfast was on the way, opted to transfer to chair. No assist with STS and bed mobility, contact for transfer, left in chair with all needs met.  Christian [name] Mobility Specialist

## 2022-11-21 NOTE — Progress Notes (Signed)
Patient Details Name: Christian [name] MRN: [id number] DOB: 1975-09-03 Today's Date: 11/21/2022   SLP returned to patient's room as planned to observe him with lunch meal. Unfortunately, patient had already finished eating entire meal. Patient's meal ticket indicated that he had ordered tray at 1115 and so SLP suspects that tray had been delivered recently and that patient ate rapidly. SLP provided more education to patient regarding importance of slowing down when eating as this is likely the cause of his choking incident leading to this admission. Patient appreciative of advice. He did report that he did not have any difficulties swallowing during lunch meal. SLP to s/o at this time.  Angela Nevin, MA, CCC-SLP Speech Therapy

## 2022-11-21 NOTE — Progress Notes (Signed)
Pt placed on auto titrate CPAP for night rest with 4 lpm bled in oxygen.

## 2022-11-21 NOTE — Evaluation (Signed)
Clinical/Bedside Swallow Evaluation Patient Details  Name: Christian [name] MRN: [id number] Date of Birth: 1975/05/27  Today's Date: 11/21/2022 Time: SLP Start Time (ACUTE ONLY): 4742 SLP Stop Time (ACUTE ONLY): 1013 SLP Time Calculation (min) (ACUTE ONLY): 20 min  Past Medical History:  Past Medical History:  Diagnosis Date   Allergic rhinitis    Anxiety    CHF (congestive heart failure) (HCC)    Depression    Depression    Elevated blood pressure reading without diagnosis of hypertension    History of chicken pox    Obesity    Pleural effusion    Sepsis (HCC)    Sleep apnea    O2 at night   Past Surgical History:  Past Surgical History:  Procedure Laterality Date   COLON SURGERY     perforated bowel   TONSILLECTOMY AND ADENOIDECTOMY  03/01/1983   HPI:  Patient is a 47 y.o. male who present on 11/19/22 from [facility] with complaints of dyspnea after choking on a piece of chicken, requiring the heimlich maneuver. Wheezing and productive cough present afterwards. PMH is significant for allergic rhinitis, anxiety, chronic diastolic, CHF, depression, obesity, pleural effusion, and sleep apnea.    Assessment / Plan / Recommendation  Clinical Impression  Patient was seen by SLP for bedside swallow evaluation following a choking event that required the Heimlich maneuver. He stated that he does not have a hx of choking on food or drink. He did not indicate that he experiences any pain or discomfort associated with swallowing, but noted that he occasionally feels like food is sticking in his throat. The patient currently resides in a homeless shelter [facility]). He stated that he often feels rushed to complete his meals there, sometimes taking too large of bites too quickly. The patient did not present with overt s/sx of aspiration during this evaluation. He was directly observed with thin liquids (via straw) and solid food (graham cracker), both of which appeared [facility].  Given the patient's hx of eating too quickly, observation during a meal would be appropriate to determine potential compensatory strategies/cues. ST will follow up at least once to observe and rule out presence of dysphagia. SLP Visit Diagnosis: Dysphagia, unspecified (R13.10)    Aspiration Risk  No limitations    Diet Recommendation Regular;Thin liquid    Liquid Administration via: Cup;Straw Medication Administration: Whole meds with liquid Supervision: Patient able to self feed Compensations: Slow rate;Small sips/bites;Minimize environmental distractions Postural Changes: Seated upright at 90 degrees    Other  Recommendations Oral Care Recommendations: Oral care BID    Recommendations for follow up therapy are one component of a multi-disciplinary discharge planning process, led by the attending physician.  Recommendations may be updated based on patient status, additional functional criteria and insurance authorization.  Follow up Recommendations  (TBD)      Assistance Recommended at Discharge    Functional Status Assessment Patient has had a recent decline in their functional status and demonstrates the ability to make significant improvements in function in a reasonable and predictable amount of time.  Frequency and Duration min 2x/week  2 weeks       Prognosis Prognosis for improved oropharyngeal function: Good      Swallow Study   General Date of Onset: 11/19/22 HPI: Patient is a 47 y.o. male who present on 11/19/22 from [facility] with complaints of dyspnea after choking on a peice of chicken, requiring the heimlich manuever. Wheezing and productive cough present afterwards. PMH is significant for  allergic rhinitis, anxiety, chronic diastolic, CHF, depression, obesity, pleural effusion, and sleep apnea. Type of Study: Bedside Swallow Evaluation Diet Prior to this Study: Regular;Thin liquids (Level 0) Respiratory Status: Nasal cannula History of Recent Intubation:  No Behavior/Cognition: Cooperative;Alert;Pleasant mood Oral Cavity Assessment: Within Functional Limits Oral Care Completed by SLP: No Vision: Functional for self-feeding Self-Feeding Abilities: Able to feed self Patient Positioning: Upright in chair Baseline Vocal Quality: Normal Volitional Swallow: Able to elicit    Oral/Motor/Sensory Function Overall Oral Motor/Sensory Function: Within functional limits   Ice Chips Ice chips: Not tested   Thin Liquid Thin Liquid: Within functional limits Presentation: Straw    Nectar Thick Nectar Thick Liquid: Not tested   Honey Thick Honey Thick Liquid: Not tested   Puree Puree: Not tested   Solid     Solid: Within functional limits Presentation: Self Fed      Marline Backbone, B.S., Speech Therapy Student   11/21/2022,10:53 AM

## 2022-11-21 NOTE — Progress Notes (Signed)
Mobility Specialist - Progress Note   11/21/22 1253  Mobility  Activity Ambulated with assistance in hallway  Level of Assistance Standby assist, set-up cues, supervision of patient - no hands on  Assistive Device Front wheel walker  Distance Ambulated (ft) 110 ft  Range of Motion/Exercises Active  Activity Response Tolerated well  Mobility Referral Yes  $Mobility charge 1 Mobility  Mobility Specialist Start Time (ACUTE ONLY) 1237  Mobility Specialist Stop Time (ACUTE ONLY) 1250  Mobility Specialist Time Calculation (min) (ACUTE ONLY) 13 min   Pt received in chair and agreed to mobility, had no issues throughout session. Returned to chair with all needs met.  Marilynne Halsted Mobility Specialist

## 2022-11-22 ENCOUNTER — Inpatient Hospital Stay (HOSPITAL_COMMUNITY): Payer: MEDICAID

## 2022-11-22 ENCOUNTER — Other Ambulatory Visit: Payer: Self-pay

## 2022-11-22 DIAGNOSIS — J690 Pneumonitis due to inhalation of food and vomit: Secondary | ICD-10-CM | POA: Diagnosis not present

## 2022-11-22 DIAGNOSIS — J9601 Acute respiratory failure with hypoxia: Secondary | ICD-10-CM | POA: Diagnosis not present

## 2022-11-22 DIAGNOSIS — R7303 Prediabetes: Secondary | ICD-10-CM | POA: Diagnosis not present

## 2022-11-22 DIAGNOSIS — I5032 Chronic diastolic (congestive) heart failure: Secondary | ICD-10-CM | POA: Diagnosis not present

## 2022-11-22 LAB — COMPREHENSIVE METABOLIC PANEL
ALT: 17 U/L (ref 0–44)
AST: 15 U/L (ref 15–41)
Albumin: 3.3 g/dL — ABNORMAL LOW (ref 3.5–5.0)
Alkaline Phosphatase: 68 U/L (ref 38–126)
Anion gap: 9 (ref 5–15)
BUN: 18 mg/dL (ref 6–20)
CO2: 30 mmol/L (ref 22–32)
Calcium: 8.3 mg/dL — ABNORMAL LOW (ref 8.9–10.3)
Chloride: 99 mmol/L (ref 98–111)
Creatinine, Ser: 0.94 mg/dL (ref 0.61–1.24)
GFR, Estimated: >60 mL/min (ref >60)
Glucose, Bld: 123 mg/dL — ABNORMAL HIGH (ref 70–99)
Potassium: 3.4 mmol/L — ABNORMAL LOW (ref 3.5–5.1)
Sodium: 138 mmol/L (ref 135–145)
Total Bilirubin: 1 mg/dL (ref 0.3–1.2)
Total Protein: 7.2 g/dL (ref 6.5–8.1)

## 2022-11-22 LAB — CBC WITH DIFFERENTIAL/PLATELET
Abs Immature Granulocytes: 0.03 10*3/uL (ref 0.00–0.07)
Basophils Absolute: 0.1 10*3/uL (ref 0.0–0.1)
Basophils Relative: 1 %
Eosinophils Absolute: 0.3 10*3/uL (ref 0.0–0.5)
Eosinophils Relative: 5 %
HCT: 40.1 % (ref 39.0–52.0)
Hemoglobin: 12.7 g/dL — ABNORMAL LOW (ref 13.0–17.0)
Immature Granulocytes: 0 %
Lymphocytes Relative: 10 %
Lymphs Abs: 0.7 10*3/uL (ref 0.7–4.0)
MCH: 26.2 pg (ref 26.0–34.0)
MCHC: 31.7 g/dL (ref 30.0–36.0)
MCV: 82.9 fL (ref 80.0–100.0)
Monocytes Absolute: 1 10*3/uL (ref 0.1–1.0)
Monocytes Relative: 13 %
Neutro Abs: 5.1 10*3/uL (ref 1.7–7.7)
Neutrophils Relative %: 71 %
Platelets: 289 10*3/uL (ref 150–400)
RBC: 4.84 MIL/uL (ref 4.22–5.81)
RDW: 14.5 % (ref 11.5–15.5)
WBC: 7.2 10*3/uL (ref 4.0–10.5)
nRBC: 0 % (ref 0.0–0.2)

## 2022-11-22 LAB — MAGNESIUM: Magnesium: 2.3 mg/dL (ref 1.7–2.4)

## 2022-11-22 LAB — GLUCOSE, CAPILLARY
Glucose-Capillary: 106 mg/dL — ABNORMAL HIGH (ref 70–99)
Glucose-Capillary: 92 mg/dL (ref 70–99)
Glucose-Capillary: 119 mg/dL — ABNORMAL HIGH (ref 70–99)

## 2022-11-22 LAB — PHOSPHORUS: Phosphorus: 3.4 mg/dL (ref 2.5–4.6)

## 2022-11-22 NOTE — Progress Notes (Signed)
   11/22/22 2010  BiPAP/CPAP/SIPAP  BiPAP/CPAP/SIPAP Pt Type Adult  BiPAP/CPAP/SIPAP DREAMSTATIOND  Mask Type Full face mask  Mask Size Large  Flow Rate 4 lpm  Patient Home Equipment No  Auto Titrate Yes (8-20)  CPAP/SIPAP surface wiped down Yes  BiPAP/CPAP /SiPAP Vitals  Pulse Rate 84  Resp 20  SpO2 92 %  Bilateral Breath Sounds Diminished;Clear  MEWS Score/Color  MEWS Score 0  MEWS Score Color Christian [name]

## 2022-11-22 NOTE — Plan of Care (Signed)
  Problem: Skin Integrity: Goal: Risk for impaired skin integrity will decrease Outcome: Progressing   Problem: Tissue Perfusion: Goal: Adequacy of tissue perfusion will improve Outcome: Progressing   Problem: Education: Goal: Knowledge of General Education information will improve Description: Including pain rating scale, medication(s)/side effects and non-pharmacologic comfort measures Outcome: Progressing   Problem: Safety: Goal: Ability to remain free from injury will improve Outcome: Progressing

## 2022-11-22 NOTE — TOC Progression Note (Signed)
Transition of Care [facility]) - Progression Note    Patient Details  Name: Christian [name] MRN: [id number] Date of Birth: [birth year]  [facility] CM/SW Contact  Otelia Santee, LCSW Phone Number: 11/22/2022, 11:15 AM  Clinical Narrative:    Met with pt in room to discuss SDOH concerns. Pt is currently staying at [facility]. Pt is connected with a CM at [facility] who is assisting him with permanent housing. Pt shares he is able to eat at the food kitchen at [facility]. He has been connected with [employer] for part time job search. He reports recently appealing his disability denial and states he has an attorney who is assisting him with this process. Pt receives medication management and therapy at the [facility]. Pt is connected with a PCP and uses [facility] for medication and has them mailed to the shelter. Pt utilizes transportation through his insurance to get to appointments. Pt has a CPAP and an O2 concentrator however, is only able to use the CPAP while staying at the shelter.  CSW spoke Maggie at the [facility] who confirms pt has a bed being held for him to return to at discharge. Pt will need bus pass at discharge to return to shelter.    Expected Discharge Plan: Homeless Shelter Barriers to Discharge: No Barriers Identified  Expected Discharge Plan and Services In-house Referral: Clinical Social Work Discharge Planning Services: NA Post Acute Care Choice: NA Living arrangements for the past 2 months: Homeless Shelter                 DME Arranged: N/A DME Agency: NA                   Social Determinants of Health (SDOH) Interventions SDOH Screenings   Food Insecurity: Food Insecurity Present (11/19/2022)  Housing: High Risk (11/19/2022)  Transportation Needs: Unmet Transportation Needs (11/19/2022)  Utilities: At Risk (11/19/2022)  Alcohol Screen: Low Risk  (07/01/2022)  Depression (PHQ2-9): High Risk (11/17/2022)  Financial Resource  Strain: High Risk (07/01/2022)  Physical Activity: Inactive (07/01/2022)  Social Connections: Moderately Integrated (07/01/2022)  Recent Concern: Social Connections - Moderately Isolated (05/11/2022)  Stress: Stress Concern Present (07/01/2022)  Tobacco Use: Low Risk  (11/21/2022)  Health Literacy: Adequate Health Literacy (11/17/2022)    Readmission Risk Interventions    11/22/2022   11:09 AM 06/16/2022    1:32 PM 10/11/2020   12:09 PM  Readmission Risk Prevention Plan  Post Dischage Appt   Complete  Medication Screening   Complete  Transportation Screening Complete Complete Complete  PCP or Specialist Appt within 5-7 Days Complete Complete   Home Care Screening Complete Complete   Medication Review (RN CM) Complete Complete

## 2022-11-22 NOTE — Progress Notes (Signed)
Chaplain engaged in an initial visit with Christian [name]. Christian [name] engaged in narrative life review sharing about his life within the last couple of years. He shared about his misfortunes with housing, his health, and disability. Though he has experienced a lot of health challenges, he finds a lot of peace and support from God and his faith community. He is an active member of his local church and even receives spiritual support from his current place of residence. Christian [name] welcomed prayer. Chaplain provided prayer with Christian [name], offered reflective listening, and support.     11/22/22 1600  Spiritual Encounters  Type of Visit Initial  Care provided to: Patient  Reason for visit Routine spiritual support  Spiritual Framework  Presenting Themes Impactful experiences and emotions;Meaning/purpose/sources of inspiration;Significant life change;Community and relationships  Interventions  Spiritual Care Interventions Made Prayer;Established relationship of care and support;Compassionate presence;Reflective listening;Normalization of emotions;Narrative/life review  Intervention Outcomes  Outcomes Awareness of support;Connection to spiritual care

## 2022-11-22 NOTE — Progress Notes (Signed)
PROGRESS NOTE    Christian [name]  ZOX:[id number] DOB: 07-01-1975 DOA: 11/18/2022 PCP: [name], MD   Brief Narrative:  HPI per Dr. Sanda Klein on 11/19/22  Christian [name] is a 47 y.o. male with medical history significant of allergic rhinitis, anxiety, chronic diastolic CHF, depression, herpes zoster, obesity, pleural effusion, sleep apnea who presented to the emergency department with complaints of dyspnea after he choked with a piece of chicken requiring the Heimlich maneuver.  He initially require NRB mask at 15 L/min but is now down to 4 LPM.  He has had a previous episode of aspiration in the past. He had wheezing and productive cough afterwards.  Denied fever, chills, rhinorrhea, sore throat or hemoptysis.  No chest pain, palpitations, diaphoresis, PND, orthopnea or but has frequent pitting edema of the lower extremities.  No abdominal pain, nausea, emesis, diarrhea, constipation, melena or hematochezia.  No flank pain, dysuria, frequency or hematuria.  No polyuria, polydipsia, polyphagia or blurred vision.   Lab work: Urinalysis was normal.  Negative coronavirus, influenza and RSV PCR.  Normal PT, INR and PTT.  CBCs are white count 11.2, hemoglobin 13.3 g/dL and platelets 811.  Troponin x 2 and BNP were normal.   Imaging: Portable 1 view chest radiograph with mild to moderate severity right-sided infiltrate.  CTA chest no evidence of PE.  There is moderate to marked severity multifocal infiltrates throughout the right upper lobe, right middle lobe and right lower lobe.  Mild left upper lobe and left lower lobe multifocal infiltrates.  Very small right pleural effusion.  Mild mediastinal and right hilar lymphadenopathy.  Simple left renal cysts with no follow-up recommended.   ED course: Initial vital signs were temperature 97.5 F, pulse 110, respirations 24, BP 142/99 mmHg O2 sat 92% on nonrebreather mask.  The patient received acetaminophen 650 mg p.o., ceftriaxone 2 g IVPB,  metronidazole 500 mg IVPB and 2500 mL LR bolus.  **Interim History Patient continues to be a little bit dyspneic but imaging showed no evidence of the PE but did show moderate to mild severity right-sided infiltrate.  Will continue antibiotics and obtain SLP evaluation. Repeat CXR reviewed by Pulmonary and they feel it is the same and that his RLL collapse is chronic. Continue O2 weaning and Treatment. Will also repeat chest x-ray in a.m. and continue to adjust his breathing regimen as necessary as he is slowly improving. Will continue to Wean Supplemental O2 via  and he is down to 4 Liters  Assessment and Plan:  Acute Respiratory Failure with Hypoxemia (HCC)in the setting of  Aspiration Pneumonia (HCC) Superimposed on Asthma, slowly improving  -C/w Telemetry Monitoring  SpO2: 90 % O2 Flow Rate (L/min): 4 L/min and will continue to Wean -Scheduled and as needed bronchodilators. -No glucocorticoids. -Continue Unasyn 3 g IVPB every 6 hours. -Check Strep pneumoniae and Legionella urinary antigen. -Check sputum Gram stain, culture and sensitivity. -Follow-up blood culture and sensitivity. -Aspiration Precautions -Continue supplemental oxygen via nasal cannula wean O2 as tolerated -Continuous pulse oximetry maintain O2 saturation greater 90% -SLP Evaluation done and apparently patient inhales his fluids and rapidly eats given that he wants to eat more food at the shelter if further close -Added Brovana and budesonide nebs -Add flutter valve, incentive spirometry and guaifenesin 1200 p.o. twice daily -Repeat CXR done yesterday  a.m. and showed "Persistent infiltrate and collapse of the right lower lobe and possibly right middle lobe. Some clearing and better aeration of the right upper lobe. No worsening or  new finding." -Repeat CXR this AM pending to be done -If patient does not continue to improve and worsens will need formal Pulmonary evaluation but for now we will continue oxygen  weaning -TOC consulted for discussion of his social determinants of health concerns   Essential Hypertension -On Torsemide 40 mg p.o. twice daily. -Continue Monitor BP, GFR and electrolytes. -Last blood pressure reading was 119/84   Chronic Diastolic CHF (congestive heart failure) (HCC) -Euvolemic No signs of volume overload. -Resume torsemide 40 mg p.o. twice daily -Might benefit from an ACE inhibitor and/or beta-blocker -Strict I's and O's and daily weights;  Intake/Output Summary (Last 24 hours) at 11/22/2022 1234 Last data filed at 11/22/2022 4696 Gross per 24 hour  Intake --  Output 3025 ml  Net -3025 ml  -Continue to monitor for signs or symptoms of volume overload  Hypokalemia -Patient's K+ Level Trend: Recent Labs  Lab 11/19/22 0055 11/20/22 0626 11/21/22 0525 11/22/22 1004  K 4.0 3.8 3.5 3.4*  -Replete with po Kcl 40 mEQ BIX x2 -Continue to Monitor and Replete as Necessary -Repeat CMP in the AM    Depression -Continue Sertraline 100 mg p.o. daily and Trazodone 100 mg p.o. at bedtime as needed.   PreDiabetes -Carbohydrate modified diet. -CBG monitoring with RI SS. -Checked Hemoglobin A1c and was 5.8 -CBG and Glucose Trend: Recent Labs  Lab 11/20/22 2145 11/21/22 0739 11/21/22 1133 11/21/22 1628 11/21/22 2215 11/22/22 0800 11/22/22 1147  GLUCAP 119* 102* 113* 128* 120* 106* 92   Recent Labs  Lab 11/19/22 0055 11/20/22 0626 11/21/22 0525 11/22/22 1004  GLUCOSE 142* 91 118* 123*    Increased Nutrient Needs -Needs to decrease carbohydrate intake. -Will consult Dietitian for further evaluation, assessment and recommendations Nutrition Status: Nutrition Problem: Increased nutrient needs Etiology: chronic illness (CHF) Signs/Symptoms: estimated needs Interventions: MVI, Refer to RD note for recommendations  Normocytic Anemia -Hgb/Hct Trend: Recent Labs  Lab 11/19/22 0055 11/20/22 0626 11/21/22 0525 11/22/22 1004  HGB 13.3 12.4* 12.2* 12.7*   HCT 41.8 38.7* 38.5* 40.1  MCV 82.6 83.8 82.6 82.9  -Checked Anemia Panel and it showed an iron level of 32, UIBC of 237, TIBC of 269, saturation ratios of 12%, ferritin level 138, folate of 14.5 and vitamin B12 529 -Continue to Monitor for S/Sx of Bleeding; No overt bleeding noted -Repeat CBC in the AM   Obstructive Sleep Apnea -Continue with CPAP with oxygen bleed in; only using the CPAP at the facility given that the facility does not have the ability or to space further oxygen concentrator  Hypoalbuminemia -Patient's Albumin Trend: Recent Labs  Lab 11/19/22 0055 11/20/22 0626 11/21/22 0525 11/22/22 1004  ALBUMIN 3.2* 3.1* 3.3* 3.3*  -Continue to Monitor and Trend and repeat CMP in the AM  Super Morbid Obesity -Complicates overall prognosis and care -Estimated body mass index is 53.98 kg/m as calculated from the following:   Height as of 10/17/22: 6\' 1"  (1.854 m).   Weight as of this encounter: 185.6 kg.  -Weight Loss and Dietary Counseling given with Lifestyle modifications. -Follow-up with closely PCP and/or bariatric clinic.   DVT prophylaxis: SCDs Start: 11/19/22 0558    Code Status: Full Code Family Communication: No family present at bedside  Disposition Plan:  Level of care: Telemetry Status is: Inpatient Remains inpatient appropriate because: Needs further clinical improvement in his Respiratory Status    Consultants:  None; Discussed with Pulmonary briefly yesterday as curbside  Procedures:  None  Antimicrobials:  Anti-infectives (From admission, onward)  Start     Dose/Rate Route Frequency Ordered Stop   11/19/22 1600  Ampicillin-Sulbactam (UNASYN) 3 g in sodium chloride 0.9 % 100 mL IVPB        3 g 200 mL/hr over 30 Minutes Intravenous Every 6 hours 11/19/22 0611     11/19/22 0345  metroNIDAZOLE (FLAGYL) IVPB 500 mg        500 mg 100 mL/hr over 60 Minutes Intravenous  Once 11/19/22 0341 11/19/22 0526   11/19/22 0245  cefTRIAXone (ROCEPHIN) 2 g  in sodium chloride 0.9 % 100 mL IVPB        2 g 200 mL/hr over 30 Minutes Intravenous  Once 11/19/22 0231 11/19/22 0353       Subjective: Seen and examined at bedside and thinks his breathing is doing little bit better.  No nausea or vomiting.  States he is not coughing as much and not really able to bring much up.  States that he has some chest discomfort whenever he breathes in.  No lightheadedness or dizziness.  No other concerns or complaints at this time and has not been weaned off of oxygen here.  Objective: Vitals:   11/22/22 0501 11/22/22 0725 11/22/22 1111 11/22/22 1231  BP: 125/73   119/84  Pulse: 70   83  Resp: 18     Temp: 97.9 F (36.6 C)   98.8 F (37.1 C)  TempSrc: Oral   Oral  SpO2: 95% 94% 91% 90%  Weight:        Intake/Output Summary (Last 24 hours) at 11/22/2022 1239 Last data filed at 11/22/2022 1610 Gross per 24 hour  Intake --  Output 3025 ml  Net -3025 ml   Filed Weights   11/20/22 0232 11/21/22 0500 11/22/22 0500  Weight: (!) 188.4 kg (!) 189.2 kg (!) 185.6 kg   Examination: Physical Exam:  Constitutional: WN/WD super morbidly obese Caucasian male in no acute distress Respiratory: Diminished to auscultation bilaterally with some coarse breath sounds and some rhonchi, no wheezing, rales, or crackles. Normal respiratory effort and patient is not tachypenic. No accessory muscle use.  Unlabored breathing but is wearing supplemental oxygen nasal cannula Cardiovascular: RRR, no murmurs / rubs / gallops. S1 and S2 auscultated. No extremity edema.   Abdomen: Soft, non-tender, distended secondary to body habitus. Bowel sounds positive.  GU: Deferred. Musculoskeletal: No clubbing / cyanosis of digits/nails. No joint deformity upper and lower extremities. Skin: No rashes, lesions, ulcers on a limited skin evaluation. No induration; Warm and dry.  Neurologic: CN 2-12 grossly intact with no focal deficits. Romberg sign and cerebellar reflexes not assessed.   Psychiatric: Normal judgment and insight. Alert and oriented x 3. Normal mood and appropriate affect.   Data Reviewed: I have personally reviewed following labs and imaging studies  CBC: Recent Labs  Lab 11/19/22 0055 11/20/22 0626 11/21/22 0525 11/22/22 1004  WBC 11.2* 9.0 9.0 7.2  NEUTROABS  --   --  6.9 5.1  HGB 13.3 12.4* 12.2* 12.7*  HCT 41.8 38.7* 38.5* 40.1  MCV 82.6 83.8 82.6 82.9  PLT 306 235 266 289   Basic Metabolic Panel: Recent Labs  Lab 11/19/22 0055 11/20/22 0626 11/21/22 0525 11/22/22 1004  NA 138 141 139 138  K 4.0 3.8 3.5 3.4*  CL 106 107 100 99  CO2 25 25 29 30   GLUCOSE 142* 91 118* 123*  BUN 20 12 15 18   CREATININE 0.98 0.73 1.01 0.94  CALCIUM 8.1* 8.4* 7.8* 8.3*  MG  --  2.2 2.0 2.3  PHOS  --  3.3 4.9* 3.4   GFR: Estimated Creatinine Clearance: 166.1 mL/min (by C-G formula based on SCr of 0.94 mg/dL). Liver Function Tests: Recent Labs  Lab 11/19/22 0055 11/20/22 0626 11/21/22 0525 11/22/22 1004  AST 14* 11* 11* 15  ALT 14 13 14 17   ALKPHOS 75 68 65 68  BILITOT 0.8 1.0 1.1 1.0  PROT 5.8* 6.2* 6.7 7.2  ALBUMIN 3.2* 3.1* 3.3* 3.3*   No results for input(s): "LIPASE", "AMYLASE" in the last 168 hours. No results for input(s): "AMMONIA" in the last 168 hours. Coagulation Profile: Recent Labs  Lab 11/19/22 0322  INR 1.1   Cardiac Enzymes: No results for input(s): "CKTOTAL", "CKMB", "CKMBINDEX", "TROPONINI" in the last 168 hours. BNP (last 3 results) No results for input(s): "PROBNP" in the last 8760 hours. HbA1C: No results for input(s): "HGBA1C" in the last 72 hours. CBG: Recent Labs  Lab 11/21/22 1133 11/21/22 1628 11/21/22 2215 11/22/22 0800 11/22/22 1147  GLUCAP 113* 128* 120* 106* 92   Lipid Profile: No results for input(s): "CHOL", "HDL", "LDLCALC", "TRIG", "CHOLHDL", "LDLDIRECT" in the last 72 hours. Thyroid Function Tests: No results for input(s): "TSH", "T4TOTAL", "FREET4", "T3FREE", "THYROIDAB" in the last 72  hours. Anemia Panel: Recent Labs    11/21/22 0525  VITAMINB12 529  FOLATE 14.5  FERRITIN 138  TIBC 269  IRON 32*  RETICCTPCT 1.1   Sepsis Labs: Recent Labs  Lab 11/19/22 0332  LATICACIDVEN 1.3    Recent Results (from the past 240 hour(s))  Resp panel by RT-PCR (RSV, Flu A&B, Covid) Anterior Nasal Swab     Status: None   Collection Time: 11/19/22  1:15 AM   Specimen: Anterior Nasal Swab  Result Value Ref Range Status   SARS Coronavirus 2 by RT PCR NEGATIVE NEGATIVE Final    Comment: (NOTE) SARS-CoV-2 target nucleic acids are NOT DETECTED.  The SARS-CoV-2 RNA is generally detectable in upper respiratory specimens during the acute phase of infection. The lowest concentration of SARS-CoV-2 viral copies this assay can detect is 138 copies/mL. A negative result does not preclude SARS-Cov-2 infection and should not be used as the sole basis for treatment or other patient management decisions. A negative result may occur with  improper specimen collection/handling, submission of specimen other than nasopharyngeal swab, presence of viral mutation(s) within the areas targeted by this assay, and inadequate number of viral copies(<138 copies/mL). A negative result must be combined with clinical observations, patient history, and epidemiological information. The expected result is Negative.  Fact Sheet for Patients:  BloggerCourse.com  Fact Sheet for Healthcare Providers:  SeriousBroker.it  This test is no t yet approved or cleared by the [place] FDA and  has been authorized for detection and/or diagnosis of SARS-CoV-2 by FDA under an Emergency Use Authorization (EUA). This EUA will remain  in effect (meaning this test can be used) for the duration of the COVID-19 declaration under Section 564(b)(1) of the Act, 21 U.S.C.section 360bbb-3(b)(1), unless the authorization is terminated  or revoked sooner.       Influenza A  by PCR NEGATIVE NEGATIVE Final   Influenza B by PCR NEGATIVE NEGATIVE Final    Comment: (NOTE) The Xpert Xpress SARS-CoV-2/FLU/RSV plus assay is intended as an aid in the diagnosis of influenza from Nasopharyngeal swab specimens and should not be used as a sole basis for treatment. Nasal washings and aspirates are unacceptable for Xpert Xpress SARS-CoV-2/FLU/RSV testing.  Fact Sheet for Patients: BloggerCourse.com  Fact Sheet  for Healthcare Providers: SeriousBroker.it  This test is not yet approved or cleared by the [place] and has been authorized for detection and/or diagnosis of SARS-CoV-2 by FDA under an Emergency Use Authorization (EUA). This EUA will remain in effect (meaning this test can be used) for the duration of the COVID-19 declaration under Section 564(b)(1) of the Act, 21 U.S.C. section 360bbb-3(b)(1), unless the authorization is terminated or revoked.     Resp Syncytial Virus by PCR NEGATIVE NEGATIVE Final    Comment: (NOTE) Fact Sheet for Patients: BloggerCourse.com  Fact Sheet for Healthcare Providers: SeriousBroker.it  This test is not yet approved or cleared by the [place] FDA and has been authorized for detection and/or diagnosis of SARS-CoV-2 by FDA under an Emergency Use Authorization (EUA). This EUA will remain in effect (meaning this test can be used) for the duration of the COVID-19 declaration under Section 564(b)(1) of the Act, 21 U.S.C. section 360bbb-3(b)(1), unless the authorization is terminated or revoked.  Performed at [facility], [address], [place], [place] [address]   Blood Culture (routine x 2)     Status: None (Preliminary result)   Collection Time: 11/19/22  3:22 AM   Specimen: BLOOD  Result Value Ref Range Status   Specimen Description   Final    BLOOD SITE NOT SPECIFIED Performed at [facility], [address], [place], [place] [address]    Special Requests   Final    Blood Culture adequate volume BOTTLES DRAWN AEROBIC AND ANAEROBIC Performed at [facility], [address], [place], [place] [address]    Culture   Final    NO GROWTH 3 DAYS Performed at [facility], [address] [place], [place] [address]    Report Status PENDING  Incomplete  Blood Culture (routine x 2)     Status: None (Preliminary result)   Collection Time: 11/19/22  3:22 AM   Specimen: BLOOD  Result Value Ref Range Status   Specimen Description   Final    BLOOD SITE NOT SPECIFIED Performed at [facility], [address], [place], [place] [address]    Special Requests   Final    Blood Culture adequate volume BOTTLES DRAWN AEROBIC AND ANAEROBIC Performed at [facility], [address], [place], [place] [address]    Culture   Final    NO GROWTH 3 DAYS Performed at [facility], [address] [place], [place] [address]    Report Status PENDING  Incomplete    Radiology Studies: DG CHEST PORT 1 VIEW  Result Date: 11/21/2022 CLINICAL DATA:  Shortness of breath.  Aspiration pneumonia. EXAM: PORTABLE CHEST 1 VIEW COMPARISON:  11/19/2022 FINDINGS: Mild patchy density persists at the left base. Infiltrate in collapse of the right lower lobe and possibly right middle lobe as seen previously. Some clearing and better aeration of the right upper lobe. No worsening or new finding. IMPRESSION: Persistent infiltrate and collapse of the right lower lobe and possibly right middle lobe. Some clearing and better aeration of the right upper lobe. No worsening or new finding. Electronically Signed   By: Paulina Fusi M.D.   On: 11/21/2022 07:37    Scheduled Meds:  arformoterol  15 mcg Nebulization BID   budesonide (PULMICORT) nebulizer solution  0.25 mg Nebulization BID   guaiFENesin  1,200 mg Oral BID   insulin aspart  0-20 Units Subcutaneous TID  WC   ipratropium-albuterol  3 mL Nebulization QID   multivitamin with minerals  1 tablet Oral Daily   sertraline  150 mg Oral Daily   torsemide  40 mg Oral BID   Continuous Infusions:  ampicillin-sulbactam (UNASYN) IV 3 g (11/22/22 0843)    LOS: 3 days   Marguerita Merles, DO [facility] Available via Epic secure chat 7am-7pm After these hours, please refer to coverage provider listed on [email or web address] 11/22/2022, 12:39 PM

## 2022-11-22 NOTE — Progress Notes (Signed)
Removed CPAP to give scheduled treatment. Will place PT on 4 LPM 02 post treatment.

## 2022-11-22 NOTE — Progress Notes (Signed)
Mobility Specialist - Progress Note   11/22/22 0900  Mobility  Activity Ambulated with assistance in hallway  Level of Assistance Standby assist, set-up cues, supervision of patient - no hands on  Assistive Device Cane  Distance Ambulated (ft) 200 ft  Range of Motion/Exercises Active  Activity Response Tolerated well  Mobility Referral Yes  $Mobility charge 1 Mobility  Mobility Specialist Start Time (ACUTE ONLY) 0920  Mobility Specialist Stop Time (ACUTE ONLY) 0935  Mobility Specialist Time Calculation (min) (ACUTE ONLY) 15 min   Pt received in chair and agreed to mobility. Had no issues throughout session. Returned to chair with all needs met.  Marilynne Halsted Mobility Specialist

## 2022-11-22 NOTE — Plan of Care (Signed)
  Problem: Education: Goal: Ability to describe self-care measures that may prevent or decrease complications (Diabetes Survival Skills Education) will improve Outcome: Progressing   Problem: Fluid Volume: Goal: Ability to maintain a balanced intake and output will improve Outcome: Progressing   Problem: Health Behavior/Discharge Planning: Goal: Ability to identify and utilize available resources and services will improve Outcome: Progressing Goal: Ability to manage health-related needs will improve Outcome: Progressing   Problem: Metabolic: Goal: Ability to maintain appropriate glucose levels will improve Outcome: Progressing   Problem: Nutritional: Goal: Maintenance of adequate nutrition will improve Outcome: Progressing   Problem: Health Behavior/Discharge Planning: Goal: Ability to manage health-related needs will improve Outcome: Progressing   Problem: Clinical Measurements: Goal: Ability to maintain clinical measurements within normal limits will improve Outcome: Progressing Goal: Diagnostic test results will improve Outcome: Progressing   Problem: Activity: Goal: Risk for activity intolerance will decrease Outcome: Progressing   Problem: Safety: Goal: Ability to remain free from injury will improve Outcome: Progressing

## 2022-11-23 ENCOUNTER — Inpatient Hospital Stay (HOSPITAL_COMMUNITY): Payer: MEDICAID

## 2022-11-23 DIAGNOSIS — J690 Pneumonitis due to inhalation of food and vomit: Secondary | ICD-10-CM | POA: Diagnosis not present

## 2022-11-23 LAB — CBC WITH DIFFERENTIAL/PLATELET
Abs Immature Granulocytes: 0.04 10*3/uL (ref 0.00–0.07)
Basophils Absolute: 0.1 10*3/uL (ref 0.0–0.1)
Basophils Relative: 1 %
Eosinophils Absolute: 0.4 10*3/uL (ref 0.0–0.5)
Eosinophils Relative: 5 %
HCT: 43 % (ref 39.0–52.0)
Hemoglobin: 13.5 g/dL (ref 13.0–17.0)
Immature Granulocytes: 1 %
Lymphocytes Relative: 13 %
Lymphs Abs: 1.1 10*3/uL (ref 0.7–4.0)
MCH: 26.5 pg (ref 26.0–34.0)
MCHC: 31.4 g/dL (ref 30.0–36.0)
MCV: 84.3 fL (ref 80.0–100.0)
Monocytes Absolute: 0.9 10*3/uL (ref 0.1–1.0)
Monocytes Relative: 11 %
Neutro Abs: 5.6 10*3/uL (ref 1.7–7.7)
Neutrophils Relative %: 69 %
Platelets: 332 10*3/uL (ref 150–400)
RBC: 5.1 MIL/uL (ref 4.22–5.81)
RDW: 14.2 % (ref 11.5–15.5)
WBC: 8.1 10*3/uL (ref 4.0–10.5)
nRBC: 0 % (ref 0.0–0.2)

## 2022-11-23 LAB — GLUCOSE, CAPILLARY
Glucose-Capillary: 109 mg/dL — ABNORMAL HIGH (ref 70–99)
Glucose-Capillary: 101 mg/dL — ABNORMAL HIGH (ref 70–99)
Glucose-Capillary: 108 mg/dL — ABNORMAL HIGH (ref 70–99)
Glucose-Capillary: 107 mg/dL — ABNORMAL HIGH (ref 70–99)
Glucose-Capillary: 121 mg/dL — ABNORMAL HIGH (ref 70–99)

## 2022-11-23 LAB — COMPREHENSIVE METABOLIC PANEL
ALT: 18 U/L (ref 0–44)
AST: 16 U/L (ref 15–41)
Albumin: 3.5 g/dL (ref 3.5–5.0)
Alkaline Phosphatase: 68 U/L (ref 38–126)
Anion gap: 11 (ref 5–15)
BUN: 23 mg/dL — ABNORMAL HIGH (ref 6–20)
CO2: 29 mmol/L (ref 22–32)
Calcium: 8.3 mg/dL — ABNORMAL LOW (ref 8.9–10.3)
Chloride: 98 mmol/L (ref 98–111)
Creatinine, Ser: 0.95 mg/dL (ref 0.61–1.24)
GFR, Estimated: >60 mL/min (ref >60)
Glucose, Bld: 106 mg/dL — ABNORMAL HIGH (ref 70–99)
Potassium: 3.4 mmol/L — ABNORMAL LOW (ref 3.5–5.1)
Sodium: 138 mmol/L (ref 135–145)
Total Bilirubin: 0.7 mg/dL (ref 0.3–1.2)
Total Protein: 7.1 g/dL (ref 6.5–8.1)

## 2022-11-23 LAB — MAGNESIUM: Magnesium: 2.4 mg/dL (ref 1.7–2.4)

## 2022-11-23 LAB — PHOSPHORUS: Phosphorus: 3.9 mg/dL (ref 2.5–4.6)

## 2022-11-23 MED ORDER — POTASSIUM CHLORIDE CRYS ER 20 MEQ PO TBCR
40.0000 meq | EXTENDED_RELEASE_TABLET | Freq: Once | ORAL | Status: AC
  Administered 2022-11-23: 40 meq via ORAL
  Filled 2022-11-23: qty 2

## 2022-11-23 MED ORDER — IPRATROPIUM-ALBUTEROL 0.5-2.5 (3) MG/3ML IN SOLN
3.0000 mL | Freq: Three times a day (TID) | RESPIRATORY_TRACT | Status: DC
  Administered 2022-11-23 – 2022-11-25 (×6): 3 mL via RESPIRATORY_TRACT
  Filled 2022-11-23 (×6): qty 3

## 2022-11-23 NOTE — Progress Notes (Signed)
Mobility Specialist - Progress Note   11/23/22 0900  Mobility  Activity Ambulated with assistance in hallway  Level of Assistance Standby assist, set-up cues, supervision of patient - no hands on  Assistive Device Cane  Distance Ambulated (ft) 200 ft  Range of Motion/Exercises Active  Activity Response Tolerated well  Mobility Referral Yes  $Mobility charge 1 Mobility  Mobility Specialist Start Time (ACUTE ONLY) 0900  Mobility Specialist Stop Time (ACUTE ONLY) 0915  Mobility Specialist Time Calculation (min) (ACUTE ONLY) 15 min   Received in chair and agreed to mobility, on 2L throughout session. Pt was standby for entire session, taking two standing rest breaks nearing EOS to combat fatigue.   Pt returned to restroom with all needs met.  Marilynne Halsted Mobility Specialist

## 2022-11-23 NOTE — Plan of Care (Signed)

## 2022-11-23 NOTE — Progress Notes (Signed)
Progress Note   Patient: Christian [name] [id number]:[id number] DOB: [birth year] DOA: 11/18/2022     4 DOS: the patient was seen and examined on 11/23/2022   Brief hospital course: 47 yo with h/o chronic diastolic CHF, depression/anxiety, morbid obesity, and OSA who presented ton 9/20 with dyspnea after he choked with a piece of chicken requiring the Heimlich maneuver.  He initially require NRB mask at 15 L/min but is now down to 4 LPM.  He has had a previous episode of aspiration in the past. CXR with mild to moderate severity right-sided infiltrate.  CTA chest no evidence of PE.  There is moderate to marked severity multifocal infiltrates throughout the right upper lobe, right middle lobe and right lower lobe.  Mild left upper lobe and left lower lobe multifocal infiltrates.  Very small right pleural effusion.  Mild mediastinal and right hilar lymphadenopathy.  On antibiotics with SLP evaluation. Repeat CXR reviewed by Pulmonary and they feel it is the same and that his RLL collapse is chronic. Continue O2 weaning as tolerated, currently on 4L.  Assessment and Plan:  Acute Respiratory Failure with Hypoxemia in the setting of  Aspiration Pneumonia, Superimposed on Asthma, slowly improving  Patient had a witnessed choking episode on food requiring the Heimlich maneuver He has been persistently hypoxic since  Imaging with mulitfocal infiltrates 87% on room air Wean O2 as tolerated Currently on 2L [place] O2 Scheduled and as needed bronchodilators. No glucocorticoids Continue Unasyn 3 g IVPB every 6 hours. Aspiration Precautions SLP Evaluation done and apparently patient inhales his fluids and rapidly eats given that he wants to eat more food at the shelter if further close Added Brovana and budesonide nebs Add flutter valve, incentive spirometry and guaifenesin 1200 p.o. twice daily If patient does not continue to improve and worsens will need formal Pulmonary evaluation but for now we will continue oxygen  weaning TOC consulted for discussion of his social determinants of health concerns   Essential Hypertension On Torsemide 40 mg p.o. twice daily.   Chronic Diastolic CHF (congestive heart failure) (HCC) Euvolemic, No signs of volume overload. Resume torsemide 40 mg p.o. twice daily Might benefit from an ACE inhibitor and/or beta-blocker Continue to monitor for signs or symptoms of volume overload  Depression Continue Sertraline 100 mg p.o. daily and Trazodone 100 mg p.o. at bedtime as needed.   PreDiabetes Hemoglobin A1c 5.8, good control No medications needed  Obstructive Sleep Apnea Continue with CPAP with oxygen bleed in; only using the CPAP at the facility given that the facility does not have the ability or to space further oxygen concentrator  Morbid Obesity Complicates overall prognosis and care Body mass index is 52.53 kg/m.Marland Kitchen  Weight loss should be encouraged Outpatient PCP/bariatric medicine/bariatric surgery f/u encouraged  Nutrition Problem: Increased nutrient needs Etiology: chronic illness (CHF) Signs/Symptoms: estimated needs Interventions: MVI, Refer to RD note for recommendations        Consultants: RT Nutrition SLP Pulmonology (telephone only)  Procedures: None  Antibiotics: Unasyn 9/21-  30 Day Unplanned Readmission Risk Score    Flowsheet Row ED to Hosp-Admission (Current) from [facility]18/2022 [facility]  30 Day Unplanned Readmission Risk Score (%) 19.64 Filed at 11/23/2022 0801       This score is the patient's risk of an unplanned readmission within 30 days of being discharged (0 -100%). The score is based on dignosis, age, lab data, medications, orders, and past utilization.   Low:  0-14.9  Medium: 15-21.9   High: 22-29.9   Extreme: 30 and above           Subjective: Feeling some better. Still needing [place] O2.  If he is able to wean off O2, he can return to the shelter; otherwise he will be a  LLOS/placement issue.   Objective: Vitals:   11/23/22 0757 11/23/22 0801  BP:    Pulse:    Resp:    Temp:    SpO2: 90% 90%    Intake/Output Summary (Last 24 hours) at 11/23/2022 1804 Last data filed at 11/23/2022 1505 Gross per 24 hour  Intake 1000 ml  Output 2000 ml  Net -1000 ml   Filed Weights   11/21/22 0500 11/22/22 0500 11/23/22 0535  Weight: (!) 189.2 kg (!) 185.6 kg (!) 180.6 kg    Exam:  General:  Appears calm and comfortable and is in NAD, on [place] O2 Eyes:  EOMI, normal lids, iris ENT:  grossly normal hearing, lips & tongue, mmm Neck:  no LAD, masses or thyromegaly Cardiovascular:  RRR, no m/r/g. No LE edema.  Respiratory:   CTA bilaterally with no wheezes/rales/rhonchi.  Normal respiratory effort. Abdomen:  soft, NT, ND Skin:  no rash or induration seen on limited exam Musculoskeletal:  grossly normal tone BUE/BLE, good ROM, no bony abnormality Psychiatric:  grossly normal mood and affect, speech fluent and appropriate, AOx3 Neurologic:  CN 2-12 grossly intact, moves all extremities in coordinated fashion  Data Reviewed: I have reviewed the patient's lab results since admission.  Pertinent labs for today include:   K+ 3.4 Normal CBC     Family Communication: None present  Disposition: Status is: Inpatient Remains inpatient appropriate because: unsafe disposition     Time spent: 35 minutes  Unresulted Labs (From admission, onward)     Start     Ordered   11/24/22 0500  CBC with Differential/Platelet  Tomorrow morning,   R       Question:  Specimen collection method  Answer:  Lab=Lab collect   11/23/22 1804   11/24/22 0500  Basic metabolic panel  Tomorrow morning,   R       Question:  Specimen collection method  Answer:  Lab=Lab collect   11/23/22 1804   11/22/22 1236  Expectorated Sputum Assessment w Gram Stain, Rflx to Resp Cult  Once,   R        11/22/22 1236             Author: Jonah Blue, MD 11/23/2022 6:04 PM  For on call  review [email or web address].

## 2022-11-23 NOTE — Progress Notes (Signed)
   11/23/22 2250  BiPAP/CPAP/SIPAP  Mask Type Full face mask  Mask Size Large  Flow Rate 4 lpm  Patient Home Equipment No  Auto Titrate Yes (12/8)  Nasal massage performed No (comment)

## 2022-11-23 NOTE — Plan of Care (Signed)
  Problem: Education: Goal: Individualized Educational Video(s) Outcome: Progressing   Problem: Fluid Volume: Goal: Ability to maintain a balanced intake and output will improve Outcome: Progressing   Problem: Nutritional: Goal: Progress toward achieving an optimal weight will improve Outcome: Progressing   Problem: Health Behavior/Discharge Planning: Goal: Ability to manage health-related needs will improve Outcome: Progressing   Problem: Clinical Measurements: Goal: Will remain free from infection Outcome: Progressing

## 2022-11-24 DIAGNOSIS — J690 Pneumonitis due to inhalation of food and vomit: Secondary | ICD-10-CM | POA: Diagnosis not present

## 2022-11-24 LAB — CBC WITH DIFFERENTIAL/PLATELET
Abs Immature Granulocytes: 0.04 10*3/uL (ref 0.00–0.07)
Basophils Absolute: 0.1 10*3/uL (ref 0.0–0.1)
Basophils Relative: 1 %
Eosinophils Absolute: 0.4 10*3/uL (ref 0.0–0.5)
Eosinophils Relative: 5 %
HCT: 41.3 % (ref 39.0–52.0)
Hemoglobin: 13 g/dL (ref 13.0–17.0)
Immature Granulocytes: 1 %
Lymphocytes Relative: 15 %
Lymphs Abs: 1.2 10*3/uL (ref 0.7–4.0)
MCH: 26.7 pg (ref 26.0–34.0)
MCHC: 31.5 g/dL (ref 30.0–36.0)
MCV: 85 fL (ref 80.0–100.0)
Monocytes Absolute: 1 10*3/uL (ref 0.1–1.0)
Monocytes Relative: 12 %
Neutro Abs: 5.2 10*3/uL (ref 1.7–7.7)
Neutrophils Relative %: 66 %
Platelets: 299 10*3/uL (ref 150–400)
RBC: 4.86 MIL/uL (ref 4.22–5.81)
RDW: 13.9 % (ref 11.5–15.5)
WBC: 7.9 10*3/uL (ref 4.0–10.5)
nRBC: 0 % (ref 0.0–0.2)

## 2022-11-24 LAB — CULTURE, BLOOD (ROUTINE X 2)
Special Requests: ADEQUATE
Special Requests: ADEQUATE

## 2022-11-24 LAB — BASIC METABOLIC PANEL
Anion gap: 10 (ref 5–15)
BUN: 21 mg/dL — ABNORMAL HIGH (ref 6–20)
CO2: 32 mmol/L (ref 22–32)
Calcium: 8.4 mg/dL — ABNORMAL LOW (ref 8.9–10.3)
Chloride: 101 mmol/L (ref 98–111)
Creatinine, Ser: 0.99 mg/dL (ref 0.61–1.24)
GFR, Estimated: >60 mL/min (ref >60)
Glucose, Bld: 102 mg/dL — ABNORMAL HIGH (ref 70–99)
Potassium: 3.7 mmol/L (ref 3.5–5.1)
Sodium: 143 mmol/L (ref 135–145)

## 2022-11-24 LAB — GLUCOSE, CAPILLARY
Glucose-Capillary: 95 mg/dL (ref 70–99)
Glucose-Capillary: 92 mg/dL (ref 70–99)
Glucose-Capillary: 114 mg/dL — ABNORMAL HIGH (ref 70–99)
Glucose-Capillary: 120 mg/dL — ABNORMAL HIGH (ref 70–99)

## 2022-11-24 NOTE — Progress Notes (Signed)
Progress Note   Patient: Christian [name] [id number] DOB: 03/31/1975 DOA: 11/18/2022     5 DOS: the patient was seen and examined on 11/24/2022   Brief hospital course: 47 yo with h/o chronic diastolic CHF, depression/anxiety, morbid obesity, and OSA who presented ton 9/20 with dyspnea after he choked with a piece of chicken requiring the Heimlich maneuver.  He initially require NRB mask at 15 L/min but is now down to 4 LPM.  He has had a previous episode of aspiration in the past. CXR with mild to moderate severity right-sided infiltrate.  CTA chest no evidence of PE.  There is moderate to marked severity multifocal infiltrates throughout the right upper lobe, right middle lobe and right lower lobe.  Mild left upper lobe and left lower lobe multifocal infiltrates.  Very small right pleural effusion.  Mild mediastinal and right hilar lymphadenopathy.  On antibiotics with SLP evaluation. Repeat CXR reviewed by Pulmonary and they feel it is the same and that his RLL collapse is chronic. Continue O2 weaning as tolerated, currently on 3L.  Assessment and Plan:  Acute Respiratory Failure with Hypoxemia in the setting of  Aspiration Pneumonia, Superimposed on Asthma, slowly improving  Patient had a witnessed choking episode on food requiring the Heimlich maneuver He has been persistently hypoxic since  Imaging with mulitfocal infiltrates 87% on room air, currently 95% on 3L [place] O2 Wean O2 as tolerated Scheduled and as needed bronchodilators. No glucocorticoids Continue Unasyn 3 g IVPB every 6 hours. Aspiration Precautions SLP Evaluation done and apparently patient inhales his fluids and rapidly eats given that he wants to eat more food at the shelter if further close Added Brovana and budesonide nebs Add flutter valve, incentive spirometry and guaifenesin 1200 p.o. twice daily If patient does not continue to improve and worsens will need formal Pulmonary evaluation but for now we will continue  oxygen weaning TOC consulted for discussion of his social determinants of health concerns - he cannot discharge to shelter with O2 supplementation (can't use it at night and may have difficulty charging it) so will either have to wean off or will remain hospitalized until another arrangement can be made   Essential Hypertension On Torsemide 40 mg p.o. twice daily.   Chronic Diastolic CHF (congestive heart failure) (HCC) Euvolemic, No signs of volume overload. Resume torsemide 40 mg p.o. twice daily Might benefit from an ACE inhibitor and/or beta-blocker Continue to monitor for signs or symptoms of volume overload   Depression Continue Sertraline 100 mg p.o. daily and Trazodone 100 mg p.o. at bedtime as needed.   PreDiabetes Hemoglobin A1c 5.8, good control No medications needed   Obstructive Sleep Apnea Continue with CPAP with oxygen bleed in; only using the CPAP at the facility given that the facility does not have the ability or to space further oxygen concentrator   Morbid Obesity Complicates overall prognosis and care Body mass index is 52.53 kg/m.Marland Kitchen  Weight loss should be encouraged Outpatient PCP/bariatric medicine/bariatric surgery f/u encouraged  Nutrition Problem: Increased nutrient needs Etiology: chronic illness (CHF) Signs/Symptoms: estimated needs Interventions: MVI, Refer to RD note for recommendations    Consultants: RT Nutrition SLP Pulmonology (telephone only)   Procedures: None   Antibiotics: Unasyn 9/21-  30 Day Unplanned Readmission Risk Score    Flowsheet Row ED to Hosp-Admission (Current) from 11/18/2022 in [facility]  30 Day Unplanned Readmission Risk Score (%) 19.92 Filed at 11/24/2022 0801  This score is the patient's risk of an unplanned readmission within 30 days of being discharged (0 -100%). The score is based on dignosis, age, lab data, medications, orders, and past utilization.   Low:   0-14.9   Medium: 15-21.9   High: 22-29.9   Extreme: 30 and above           Subjective: He reports feeling better today, more like his usual baseline.  No new concerns.   Objective: Vitals:   11/24/22 0752 11/24/22 0755  BP:    Pulse:    Resp:    Temp:    SpO2: 95% 95%    Intake/Output Summary (Last 24 hours) at 11/24/2022 0827 Last data filed at 11/24/2022 0600 Gross per 24 hour  Intake 322.33 ml  Output 1200 ml  Net -877.67 ml   Filed Weights   11/22/22 0500 11/23/22 0535 11/24/22 0457  Weight: (!) 185.6 kg (!) 180.6 kg (!) 178.8 kg    Exam:  General:  Appears calm and comfortable and is in NAD, on [place] O2, sitting up in the bedside chair Eyes:  EOMI, normal lids, iris ENT:  grossly normal hearing, lips & tongue, mmm Neck:  no LAD, masses or thyromegaly Cardiovascular:  RRR, no m/r/g. No LE edema.  Respiratory:   CTA bilaterally with no wheezes/rales/rhonchi.  Normal respiratory effort. Abdomen:  soft, NT, ND Skin:  no rash or induration seen on limited exam Musculoskeletal:  grossly normal tone BUE/BLE, good ROM, no bony abnormality Psychiatric:  grossly normal mood and affect, speech fluent and appropriate, AOx3 Neurologic:  CN 2-12 grossly intact, moves all extremities in coordinated fashion  Data Reviewed: I have reviewed the patient's lab results since admission.  Pertinent labs for today include:   Unremarkable BMP Normal CBC     Family Communication: None present  Disposition: Status is: Inpatient Remains inpatient appropriate because: still O2 dependent     Time spent: 35 minutes  Unresulted Labs (From admission, onward)     Start     Ordered   11/22/22 1236  Expectorated Sputum Assessment w Gram Stain, Rflx to Resp Cult  Once,   R        11/22/22 1236             Author: Jonah Blue, MD 11/24/2022 8:27 AM  For on call review [email or web address].

## 2022-11-24 NOTE — Progress Notes (Signed)
   11/24/22 2304  BiPAP/CPAP/SIPAP  BiPAP/CPAP/SIPAP Pt Type Adult  BiPAP/CPAP/SIPAP DREAMSTATIOND  Mask Type Full face mask  Mask Size Large  Respiratory Rate 18 breaths/min  IPAP 20 cmH20 (max 20)  EPAP 8 cmH2O (min 8)  FiO2 (%) 28 %  Flow Rate 2 lpm  Patient Home Equipment No  Auto Titrate Yes  BiPAP/CPAP /SiPAP Vitals  Resp 18  Bilateral Breath Sounds Diminished  MEWS Score/Color  MEWS Score 0  MEWS Score Color Green

## 2022-11-24 NOTE — Plan of Care (Signed)
Problem: Activity: Goal: Risk for activity intolerance will decrease Outcome: Progressing   Problem: Elimination: Goal: Will not experience complications related to urinary retention Outcome: Progressing   Problem: Pain Managment: Goal: General experience of comfort will improve Outcome: Progressing   Problem: Skin Integrity: Goal: Risk for impaired skin integrity will decrease Outcome: Progressing

## 2022-11-25 DIAGNOSIS — J690 Pneumonitis due to inhalation of food and vomit: Secondary | ICD-10-CM | POA: Diagnosis not present

## 2022-11-25 LAB — GLUCOSE, CAPILLARY
Glucose-Capillary: 100 mg/dL — ABNORMAL HIGH (ref 70–99)
Glucose-Capillary: 102 mg/dL — ABNORMAL HIGH (ref 70–99)
Glucose-Capillary: 116 mg/dL — ABNORMAL HIGH (ref 70–99)
Glucose-Capillary: 152 mg/dL — ABNORMAL HIGH (ref 70–99)

## 2022-11-25 MED ORDER — BENZONATATE 100 MG PO CAPS
200.0000 mg | ORAL_CAPSULE | Freq: Three times a day (TID) | ORAL | Status: DC | PRN
  Administered 2022-11-25: 200 mg via ORAL
  Filled 2022-11-25: qty 2

## 2022-11-25 MED ORDER — ALBUTEROL SULFATE (2.5 MG/3ML) 0.083% IN NEBU: 2.5000 mg | INHALATION_SOLUTION | RESPIRATORY_TRACT | Status: DC | PRN

## 2022-11-25 NOTE — Progress Notes (Signed)
   11/25/22 2255  BiPAP/CPAP/SIPAP  BiPAP/CPAP/SIPAP Pt Type Adult  BiPAP/CPAP/SIPAP DREAMSTATIOND  Mask Type Full face mask  Mask Size Large  Respiratory Rate 18 breaths/min  IPAP 20 cmH20 (max 20)  EPAP 8 cmH2O (min 8)  Flow Rate 3 lpm  Patient Home Equipment No  Auto Titrate Yes  BiPAP/CPAP /SiPAP Vitals  Resp 18  MEWS Score/Color  MEWS Score 0  MEWS Score Color Green

## 2022-11-25 NOTE — Progress Notes (Signed)
Progress Note   Patient: Christian [name] ZOX:[id number] DOB: 1975/11/06 DOA: 11/18/2022     6 DOS: the patient was seen and examined on 11/25/2022   Brief hospital course: 47 yo with h/o chronic diastolic CHF, depression/anxiety, morbid obesity, and OSA who presented ton 9/20 with dyspnea after he choked with a piece of chicken requiring the Heimlich maneuver.  He initially require NRB mask at 15 L/min but is now down to 4 LPM.  He has had a previous episode of aspiration in the past. CXR with mild to moderate severity right-sided infiltrate.  CTA chest no evidence of PE.  There is moderate to marked severity multifocal infiltrates throughout the right upper lobe, right middle lobe and right lower lobe.  Mild left upper lobe and left lower lobe multifocal infiltrates.  Very small right pleural effusion.  Mild mediastinal and right hilar lymphadenopathy.  On antibiotics with SLP evaluation. Repeat CXR reviewed by Pulmonary and they feel it is the same and that his RLL collapse is chronic. Continue O2 weaning as tolerated, currently on 3L.  Assessment and Plan:  Acute Respiratory Failure with Hypoxemia in the setting of  Aspiration Pneumonia, Superimposed on Asthma, slowly improving  Patient had a witnessed choking episode on food requiring the Heimlich maneuver He has been persistently hypoxic since  Imaging with mulitfocal infiltrates 87% on room air, 88% this AM on 2L and turned up to 3L Wean O2 as tolerated Scheduled and as needed bronchodilators. No glucocorticoids Continue Unasyn 3 g IVPB every 6 hours for 7 days; can transition to Augmentin prior to dc if antibiotics are still needed Aspiration Precautions SLP Evaluation done and apparently patient inhales his fluids and rapidly eats given that he wants to eat more food at the shelter if further close Added Brovana and budesonide nebs Add flutter valve, incentive spirometry and guaifenesin 1200 p.o. twice daily If patient does not  continue to improve and worsens will need formal Pulmonary evaluation but for now we will continue oxygen weaning TOC consulted for discussion of his social determinants of health concerns - he cannot discharge to shelter with O2 supplementation (can't use it at night and may have difficulty charging it) so will either have to wean off or will remain hospitalized until another arrangement can be made Will check procalcitonin   Essential Hypertension On Torsemide 40 mg p.o. twice daily.   Chronic Diastolic CHF (congestive heart failure) (HCC) Euvolemic, No signs of volume overload. Resume torsemide 40 mg p.o. twice daily Might benefit from an ACE inhibitor and/or beta-blocker Continue to monitor for signs or symptoms of volume overload   Depression Continue Sertraline 100 mg p.o. daily and Trazodone 100 mg p.o. at bedtime as needed.   PreDiabetes Hemoglobin A1c 5.8, good control No medications needed   Obstructive Sleep Apnea Continue with CPAP with oxygen bleed in; only using the CPAP at the facility given that the facility does not have the ability or to space further oxygen concentrator   Morbid Obesity Complicates overall prognosis and care Body mass index is 52.53 kg/m.Marland Kitchen  Weight loss should be encouraged Outpatient PCP/bariatric medicine/bariatric surgery f/u encouraged  Nutrition Problem: Increased nutrient needs Etiology: chronic illness (CHF) Signs/Symptoms: estimated needs Interventions: MVI, Refer to RD note for recommendations     Consultants: RT Nutrition SLP Pulmonology (telephone only)   Procedures: None   Antibiotics: Unasyn 9/21-      30 Day Unplanned Readmission Risk Score    Flowsheet Row ED to Hosp-Admission (Current) from 11/18/2022 in  [facility]  30 Day Unplanned Readmission Risk Score (%) 20.2 Filed at 11/25/2022 0801       This score is the patient's risk of an unplanned readmission within 30 days of  being discharged (0 -100%). The score is based on dignosis, age, lab data, medications, orders, and past utilization.   Low:  0-14.9   Medium: 15-21.9   High: 22-29.9   Extreme: 30 and above           Subjective: Continues to feel ok, but unable to wean O2.   Objective: Vitals:   11/25/22 0945 11/25/22 1009  BP:  (!) 151/65  Pulse:  78  Resp:  18  Temp:  98.1 F (36.7 C)  SpO2: (!) 88% 94%    Intake/Output Summary (Last 24 hours) at 11/25/2022 1442 Last data filed at 11/25/2022 0845 Gross per 24 hour  Intake 614 ml  Output 2650 ml  Net -2036 ml   Filed Weights   11/23/22 0535 11/24/22 0457 11/25/22 0500  Weight: (!) 180.6 kg (!) 178.8 kg (!) 181.4 kg    Exam:  General:  Appears calm and comfortable and is in NAD, on [place] O2, sitting up in the bedside chair Eyes:  EOMI, normal lids, iris ENT:  grossly normal hearing, lips & tongue, mmm Neck:  no LAD, masses or thyromegaly Cardiovascular:  RRR, no m/r/g. No LE edema.  Respiratory:   CTA bilaterally with no wheezes/rales/rhonchi.  Normal respiratory effort. Abdomen:  soft, NT, ND Skin:  no rash or induration seen on limited exam Musculoskeletal:  grossly normal tone BUE/BLE, good ROM, no bony abnormality Psychiatric:  grossly normal mood and affect, speech fluent and appropriate, AOx3 Neurologic:  CN 2-12 grossly intact, moves all extremities in coordinated fashion   Data Reviewed: I have reviewed the patient's lab results since admission.  Pertinent labs for today include:   None     Family Communication: None present  Disposition: Status is: Inpatient Remains inpatient appropriate because: unsafe disposition     Time spent: 35 minutes  Unresulted Labs (From admission, onward)     Start     Ordered   11/26/22 0500  CBC with Differential/Platelet  Tomorrow morning,   R       Question:  Specimen collection method  Answer:  Lab=Lab collect   11/25/22 1440   11/26/22 0500  Basic metabolic panel   Tomorrow morning,   R       Question:  Specimen collection method  Answer:  Lab=Lab collect   11/25/22 1440   11/26/22 0500  Procalcitonin  Tomorrow morning,   R       References:    Procalcitonin Lower Respiratory Tract Infection AND Sepsis Procalcitonin Algorithm  Question:  Specimen collection method  Answer:  Lab=Lab collect   11/25/22 1440             Author: Jonah Blue, MD 11/25/2022 2:42 PM  For on call review [email or web address].

## 2022-11-25 NOTE — Progress Notes (Signed)
Mobility Specialist - Progress Note  Pre-mobility: 74 bpm HR, 92% SpO2 During mobility: 103 bpm HR, 90% SpO2 Post-mobility: 87 bpm HR, 95% SPO2   11/25/22 1249  Oxygen Therapy  O2 Device Nasal Cannula  O2 Flow Rate (L/min) 3 L/min  Patient Activity (if Appropriate) Ambulating  Mobility  Activity Ambulated with assistance in hallway  Level of Assistance Modified independent, requires aide device or extra time  Assistive Device [employer] Ambulated (ft) 250 ft  Range of Motion/Exercises Active  Activity Response Tolerated well  Mobility Referral Yes  $Mobility charge 1 Mobility  Mobility Specialist Start Time (ACUTE ONLY) 1235  Mobility Specialist Stop Time (ACUTE ONLY) 1249  Mobility Specialist Time Calculation (min) (ACUTE ONLY) 14 min   Pt was found on recliner chair and agreeable to ambulate. Had x2 brief standing rest breaks due to fatigue. At EOS returned to recliner chair with all needs met. Call bell in reach and RN notified.  [name] Chang Mobility Specialist

## 2022-11-25 NOTE — Progress Notes (Signed)
The patient ambulated today on room. He walked approximately 50 feet, Oxygen saturation decrease to 86%. Pt was encouraged to take deep breaths increasing o2 sat 88%. 3 liters o2 applied [place] o2 sat increased 90%.

## 2022-11-26 DIAGNOSIS — J690 Pneumonitis due to inhalation of food and vomit: Secondary | ICD-10-CM | POA: Diagnosis not present

## 2022-11-26 LAB — CBC WITH DIFFERENTIAL/PLATELET
Abs Immature Granulocytes: 0.06 10*3/uL (ref 0.00–0.07)
Basophils Absolute: 0.1 10*3/uL (ref 0.0–0.1)
Basophils Relative: 1 %
Eosinophils Absolute: 0.3 10*3/uL (ref 0.0–0.5)
Eosinophils Relative: 3 %
HCT: 42 % (ref 39.0–52.0)
Hemoglobin: 13.1 g/dL (ref 13.0–17.0)
Immature Granulocytes: 1 %
Lymphocytes Relative: 20 %
Lymphs Abs: 1.6 10*3/uL (ref 0.7–4.0)
MCH: 26.1 pg (ref 26.0–34.0)
MCHC: 31.2 g/dL (ref 30.0–36.0)
MCV: 83.8 fL (ref 80.0–100.0)
Monocytes Absolute: 0.8 10*3/uL (ref 0.1–1.0)
Monocytes Relative: 10 %
Neutro Abs: 5.2 10*3/uL (ref 1.7–7.7)
Neutrophils Relative %: 65 %
Platelets: 341 10*3/uL (ref 150–400)
RBC: 5.01 MIL/uL (ref 4.22–5.81)
RDW: 13.8 % (ref 11.5–15.5)
WBC: 8 10*3/uL (ref 4.0–10.5)
nRBC: 0 % (ref 0.0–0.2)

## 2022-11-26 LAB — GLUCOSE, CAPILLARY
Glucose-Capillary: 93 mg/dL (ref 70–99)
Glucose-Capillary: 102 mg/dL — ABNORMAL HIGH (ref 70–99)
Glucose-Capillary: 111 mg/dL — ABNORMAL HIGH (ref 70–99)
Glucose-Capillary: 109 mg/dL — ABNORMAL HIGH (ref 70–99)

## 2022-11-26 LAB — BASIC METABOLIC PANEL
Anion gap: 10 (ref 5–15)
BUN: 21 mg/dL — ABNORMAL HIGH (ref 6–20)
CO2: 31 mmol/L (ref 22–32)
Calcium: 8.6 mg/dL — ABNORMAL LOW (ref 8.9–10.3)
Chloride: 103 mmol/L (ref 98–111)
Creatinine, Ser: 0.98 mg/dL (ref 0.61–1.24)
GFR, Estimated: >60 mL/min (ref >60)
Glucose, Bld: 93 mg/dL (ref 70–99)
Potassium: 3.4 mmol/L — ABNORMAL LOW (ref 3.5–5.1)
Sodium: 144 mmol/L (ref 135–145)

## 2022-11-26 LAB — PROCALCITONIN: Procalcitonin: 0.38 ng/mL

## 2022-11-26 MED ORDER — POTASSIUM CHLORIDE CRYS ER 20 MEQ PO TBCR
40.0000 meq | EXTENDED_RELEASE_TABLET | Freq: Once | ORAL | Status: AC
  Administered 2022-11-26: 40 meq via ORAL
  Filled 2022-11-26: qty 2

## 2022-11-26 NOTE — Plan of Care (Signed)
  Problem: Education: Goal: Ability to describe self-care measures that may prevent or decrease complications (Diabetes Survival Skills Education) will improve Outcome: Progressing Goal: Individualized Educational Video(s) Outcome: Progressing   Problem: Health Behavior/Discharge Planning: Goal: Ability to identify and utilize available resources and services will improve Outcome: Progressing Goal: Ability to manage health-related needs will improve Outcome: Progressing   

## 2022-11-26 NOTE — Progress Notes (Signed)
   11/26/22 2344  BiPAP/CPAP/SIPAP  BiPAP/CPAP/SIPAP Pt Type Adult  BiPAP/CPAP/SIPAP DREAMSTATIOND  Mask Type Full face mask  Mask Size Large  Respiratory Rate 18 breaths/min  Flow Rate 3 lpm  Patient Home Equipment No  Auto Titrate Yes (auto 20/8)  CPAP/SIPAP surface wiped down Yes  BiPAP/CPAP /SiPAP Vitals  Pulse Rate (!) 57  Resp 18  SpO2 96 %  Bilateral Breath Sounds Clear;Diminished  MEWS Score/Color  MEWS Score 0  MEWS Score Color [place]

## 2022-11-26 NOTE — Plan of Care (Signed)
  Problem: Coping: Goal: Ability to adjust to condition or change in health will improve Outcome: Progressing   Problem: Health Behavior/Discharge Planning: Goal: Ability to identify and utilize available resources and services will improve Outcome: Progressing   Problem: Metabolic: Goal: Ability to maintain appropriate glucose levels will improve Outcome: Progressing   Problem: Skin Integrity: Goal: Risk for impaired skin integrity will decrease Outcome: Progressing   Problem: Education: Goal: Knowledge of General Education information will improve Description: Including pain rating scale, medication(s)/side effects and non-pharmacologic comfort measures Outcome: Progressing   Problem: Clinical Measurements: Goal: Ability to maintain clinical measurements within normal limits will improve Outcome: Progressing   Problem: Pain Managment: Goal: General experience of comfort will improve Outcome: Progressing   Problem: Safety: Goal: Ability to remain free from injury will improve Outcome: Progressing

## 2022-11-26 NOTE — Progress Notes (Signed)
Progress Note   Patient: Christian [name] ZOX:[id number] DOB: 06-28-1975 DOA: 11/18/2022     7 DOS: the patient was seen and examined on 11/26/2022   Brief hospital course: 47 yo with h/o chronic diastolic CHF, depression/anxiety, morbid obesity, and OSA who presented ton 9/20 with dyspnea after he choked with a piece of chicken requiring the Heimlich maneuver.  He initially require NRB mask at 15 L/min but is now down to 4 LPM.  He has had a previous episode of aspiration in the past. CXR with mild to moderate severity right-sided infiltrate.  CTA chest no evidence of PE.  There is moderate to marked severity multifocal infiltrates throughout the right upper lobe, right middle lobe and right lower lobe.  Mild left upper lobe and left lower lobe multifocal infiltrates.  Very small right pleural effusion.  Mild mediastinal and right hilar lymphadenopathy.  On antibiotics with SLP evaluation. Repeat CXR reviewed by Pulmonary and they feel it is the same and that his RLL collapse is chronic. Continue O2 weaning as tolerated, currently on 3L.  Assessment and Plan:  Acute Respiratory Failure with Hypoxemia in the setting of  Aspiration Pneumonia, Superimposed on Asthma, slowly improving  Patient had a witnessed choking episode on food requiring the Heimlich maneuver He has been persistently hypoxic since  Imaging with mulitfocal infiltrates 87% on room air at times, O2 goal is >88% based on probable OSA/OHS Wean O2 as tolerated Scheduled and as needed bronchodilators. No glucocorticoids Continue Unasyn 3 g IVPB every 6 hours for 7 days, has completed abx as of 9/28 Aspiration Precautions SLP Evaluation done and apparently patient inhales his fluids and rapidly eats given that he wants to eat more food at the shelter if further close Added Brovana and budesonide nebs Add flutter valve, incentive spirometry and guaifenesin 1200 p.o. twice daily If patient does not continue to improve and worsens will  need formal Pulmonary evaluation but for now we will continue oxygen weaning TOC consulted for discussion of his social determinants of health concerns - he cannot discharge to shelter with O2 supplementation (can't use it at night and may have difficulty charging it) so will either have to wean off or will remain hospitalized until another arrangement can be made   Essential Hypertension On Torsemide 40 mg p.o. twice daily.   Chronic Diastolic CHF (congestive heart failure) (HCC) Euvolemic, No signs of volume overload. Resume torsemide 40 mg p.o. twice daily Might benefit from an ACE inhibitor and/or beta-blocker Continue to monitor for signs or symptoms of volume overload   Depression Continue Sertraline 100 mg p.o. daily and Trazodone 100 mg p.o. at bedtime as needed.   PreDiabetes Hemoglobin A1c 5.8, good control No medications needed   Obstructive Sleep Apnea Continue with CPAP with oxygen bleed in; only using the CPAP at the facility given that the facility does not have the ability or to space further oxygen concentrator   Morbid Obesity Complicates overall prognosis and care Body mass index is 52.53 kg/m.Marland Kitchen  Weight loss should be encouraged Outpatient PCP/bariatric medicine/bariatric surgery f/u encouraged  Nutrition Problem: Increased nutrient needs Etiology: chronic illness (CHF) Signs/Symptoms: estimated needs Interventions: MVI, Refer to RD note for recommendations     Consultants: RT Nutrition SLP Pulmonology (telephone only)   Procedures: None   Antibiotics: Unasyn 9/21-28     30 Day Unplanned Readmission Risk Score    Flowsheet Row ED to Hosp-Admission (Current) from 11/18/2022 in [facility]  30  Day Unplanned Readmission Risk Score (%) 18.96 Filed at 11/26/2022 0800       This score is the patient's risk of an unplanned readmission within 30 days of being discharged (0 -100%). The score is based on dignosis,  age, lab data, medications, orders, and past utilization.   Low:  0-14.9   Medium: 15-21.9   High: 22-29.9   Extreme: 30 and above           Subjective: Doing ok, feeling better every day.  Still with some O2 dependence, goal is at least 88%.   Objective: Vitals:   11/26/22 1100 11/26/22 1223  BP:  (!) 146/88  Pulse:  69  Resp:  18  Temp:  98.2 F (36.8 C)  SpO2: 92% 91%    Intake/Output Summary (Last 24 hours) at 11/26/2022 1555 Last data filed at 11/26/2022 0931 Gross per 24 hour  Intake 740 ml  Output 2100 ml  Net -1360 ml   Filed Weights   11/24/22 0457 11/25/22 0500 11/26/22 0132  Weight: (!) 178.8 kg (!) 181.4 kg (!) 182.2 kg    Exam:  General:  Appears calm and comfortable and is in NAD, on [place] O2, sitting up in the bedside chair Eyes:  EOMI, normal lids, iris ENT:  grossly normal hearing, lips & tongue, mmm Neck:  no LAD, masses or thyromegaly Cardiovascular:  RRR, no m/r/g. No LE edema.  Respiratory:   CTA bilaterally with no wheezes/rales/rhonchi.  Normal respiratory effort. Abdomen:  soft, NT, ND Skin:  no rash or induration seen on limited exam Musculoskeletal:  grossly normal tone BUE/BLE, good ROM, no bony abnormality Psychiatric:  grossly normal mood and affect, speech fluent and appropriate, AOx3 Neurologic:  CN 2-12 grossly intact, moves all extremities in coordinated fashion  Data Reviewed: I have reviewed the patient's lab results since admission.  Pertinent labs for today include:   BMP pending Normal CBC     Family Communication: None present - he is homeless  Disposition: Status is: Inpatient Remains inpatient appropriate because: weaning O2     Time spent: 35 minutes  Unresulted Labs (From admission, onward)     Start     Ordered   11/27/22 0500  Basic metabolic panel  Tomorrow morning,   R       Question:  Specimen collection method  Answer:  Lab=Lab collect   11/26/22 1555   11/27/22 0500  CBC with Differential/Platelet   Tomorrow morning,   R       Question:  Specimen collection method  Answer:  Lab=Lab collect   11/26/22 1555             Author: Jonah Blue, MD 11/26/2022 3:55 PM  For on call review [email or web address].

## 2022-11-27 DIAGNOSIS — J690 Pneumonitis due to inhalation of food and vomit: Secondary | ICD-10-CM | POA: Diagnosis not present

## 2022-11-27 LAB — CBC WITH DIFFERENTIAL/PLATELET
Abs Immature Granulocytes: 0.07 10*3/uL (ref 0.00–0.07)
Basophils Absolute: 0.1 10*3/uL (ref 0.0–0.1)
Basophils Relative: 1 %
Eosinophils Absolute: 0.3 10*3/uL (ref 0.0–0.5)
Eosinophils Relative: 3 %
HCT: 41.3 % (ref 39.0–52.0)
Hemoglobin: 12.9 g/dL — ABNORMAL LOW (ref 13.0–17.0)
Immature Granulocytes: 1 %
Lymphocytes Relative: 22 %
Lymphs Abs: 1.7 10*3/uL (ref 0.7–4.0)
MCH: 26 pg (ref 26.0–34.0)
MCHC: 31.2 g/dL (ref 30.0–36.0)
MCV: 83.3 fL (ref 80.0–100.0)
Monocytes Absolute: 0.8 10*3/uL (ref 0.1–1.0)
Monocytes Relative: 10 %
Neutro Abs: 4.8 10*3/uL (ref 1.7–7.7)
Neutrophils Relative %: 63 %
Platelets: 354 10*3/uL (ref 150–400)
RBC: 4.96 MIL/uL (ref 4.22–5.81)
RDW: 13.9 % (ref 11.5–15.5)
WBC: 7.7 10*3/uL (ref 4.0–10.5)
nRBC: 0 % (ref 0.0–0.2)

## 2022-11-27 LAB — BASIC METABOLIC PANEL
Anion gap: 10 (ref 5–15)
BUN: 21 mg/dL — ABNORMAL HIGH (ref 6–20)
CO2: 29 mmol/L (ref 22–32)
Calcium: 8.4 mg/dL — ABNORMAL LOW (ref 8.9–10.3)
Chloride: 102 mmol/L (ref 98–111)
Creatinine, Ser: 1.02 mg/dL (ref 0.61–1.24)
GFR, Estimated: >60 mL/min (ref >60)
Glucose, Bld: 92 mg/dL (ref 70–99)
Potassium: 3.2 mmol/L — ABNORMAL LOW (ref 3.5–5.1)
Sodium: 141 mmol/L (ref 135–145)

## 2022-11-27 LAB — GLUCOSE, CAPILLARY
Glucose-Capillary: 91 mg/dL (ref 70–99)
Glucose-Capillary: 103 mg/dL — ABNORMAL HIGH (ref 70–99)
Glucose-Capillary: 129 mg/dL — ABNORMAL HIGH (ref 70–99)
Glucose-Capillary: 132 mg/dL — ABNORMAL HIGH (ref 70–99)

## 2022-11-27 MED ORDER — POTASSIUM CHLORIDE CRYS ER 20 MEQ PO TBCR
40.0000 meq | EXTENDED_RELEASE_TABLET | Freq: Once | ORAL | Status: AC
  Administered 2022-11-27: 40 meq via ORAL
  Filled 2022-11-27: qty 2

## 2022-11-27 NOTE — Progress Notes (Signed)
   11/27/22 2345  BiPAP/CPAP/SIPAP  BiPAP/CPAP/SIPAP Pt Type Adult  BiPAP/CPAP/SIPAP DREAMSTATIOND  Mask Type Full face mask  Mask Size Large  Respiratory Rate 18 breaths/min  Flow Rate 2 lpm  Patient Home Equipment No  Auto Titrate Yes (8-20)

## 2022-11-27 NOTE — Progress Notes (Signed)
Mobility Specialist - Progress Note   11/27/22 1000  Mobility  Activity Ambulated with assistance in hallway  Level of Assistance Standby assist, set-up cues, supervision of patient - no hands on  Assistive Device Cane  Distance Ambulated (ft) 200 ft  Range of Motion/Exercises Active  Activity Response Tolerated well  Mobility Referral Yes  $Mobility charge 1 Mobility  Mobility Specialist Start Time (ACUTE ONLY) 1000  Mobility Specialist Stop Time (ACUTE ONLY) 1010  Mobility Specialist Time Calculation (min) (ACUTE ONLY) 10 min   Pt received in chair and agreed to mobility.   SpO2% was at 88% sitting in chair, upon ambulation pt saturation increased to 91%. Fluctuated from 88-92% throughout the entire session and upon returning to room pt saturation was at 94%.  No c/o SOB nor pain throughout session.   Returned to chair with all needs met.  Marilynne Halsted Mobility Specialist

## 2022-11-27 NOTE — Progress Notes (Signed)
Progress Note   Patient: Christian [name] [id number]:[id number] DOB: Dec 25, 1975 DOA: 11/18/2022     8 DOS: the patient was seen and examined on 11/27/2022   Brief hospital course: 47 yo with h/o chronic diastolic CHF, depression/anxiety, morbid obesity, and OSA who presented ton 9/20 with dyspnea after he choked with a piece of chicken requiring the Heimlich maneuver.  He initially require NRB mask at 15 L/min but is now down to 4 LPM.  He has had a previous episode of aspiration in the past. CXR with mild to moderate severity right-sided infiltrate.  CTA chest no evidence of PE.  There is moderate to marked severity multifocal infiltrates throughout the right upper lobe, right middle lobe and right lower lobe.  Mild left upper lobe and left lower lobe multifocal infiltrates.  Very small right pleural effusion.  Mild mediastinal and right hilar lymphadenopathy.  On antibiotics with SLP evaluation. Repeat CXR reviewed by Pulmonary and they feel it is the same and that his RLL collapse is chronic. Continue O2 weaning as tolerated, currently on 1L.  Assessment and Plan:  Acute Respiratory Failure with Hypoxemia in the setting of  Aspiration Pneumonia, Superimposed on Asthma, slowly improving  Patient had a witnessed choking episode on food requiring the Heimlich maneuver He has been persistently hypoxic since  Imaging with mulitfocal infiltrates 87% on room air at times, O2 goal is >88% based on probable OSA/OHS Wean O2 as tolerated Scheduled and as needed bronchodilators. No glucocorticoids Continue Unasyn 3 g IVPB every 6 hours for 7 days, has completed abx as of 9/28 Aspiration Precautions SLP Evaluation done and apparently patient inhales his fluids and rapidly eats given that he wants to eat more food at the shelter if further close Added Brovana and budesonide nebs Add flutter valve, incentive spirometry and guaifenesin 1200 p.o. twice daily If patient does not continue to improve and worsens will  need formal Pulmonary evaluation but for now we will continue oxygen weaning TOC consulted for discussion of his social determinants of health concerns - he cannot discharge to shelter with continuous O2 supplementation (can't use it at night and may have difficulty charging it) so will either have to wean off or will remain hospitalized until another arrangement can be made He appears to be weaning off as of 9/29 and hopefully can be discharged on 9/30 back to the shelter   Essential Hypertension On Torsemide 40 mg p.o. twice daily.   Chronic Diastolic CHF (congestive heart failure) (HCC) Euvolemic, No signs of volume overload. Resume torsemide 40 mg p.o. twice daily Might benefit from an ACE inhibitor and/or beta-blocker Continue to monitor for signs or symptoms of volume overload   Depression Continue Sertraline 100 mg p.o. daily and Trazodone 100 mg p.o. at bedtime as needed.   PreDiabetes Hemoglobin A1c 5.8, good control No medications needed   Obstructive Sleep Apnea Continue with CPAP with oxygen bleed in; only using the CPAP at the facility given that the facility does not have the ability or to space further oxygen concentrator   Morbid Obesity Complicates overall prognosis and care Body mass index is 52.53 kg/m.Marland Kitchen  Weight loss should be encouraged Outpatient PCP/bariatric medicine/bariatric surgery f/u encouraged  Nutrition Problem: Increased nutrient needs Etiology: chronic illness (CHF) Signs/Symptoms: estimated needs Interventions: MVI, Refer to RD note for recommendations     Consultants: RT Nutrition SLP Pulmonology (telephone only)   Procedures: None   Antibiotics: Unasyn 9/21-28      30 Day Unplanned Readmission Risk Score  Flowsheet Row ED to Hosp-Admission (Current) from [facility]18/2022 in [facility]  30 Day Unplanned Readmission Risk Score (%) 19 Filed at 11/27/2022 0401       This score is the patient's  risk of an unplanned readmission within 30 days of being discharged (0 -100%). The score is based on dignosis, age, lab data, medications, orders, and past utilization.   Low:  0-14.9   Medium: 15-21.9   High: 22-29.9   Extreme: 30 and above           Subjective: No new concerns, continuing to feel better daily.   Objective: Vitals:   11/27/22 0611 11/27/22 0753  BP: 121/77   Pulse: 61   Resp: 18   Temp: 97.7 F (36.5 C)   SpO2: 94% 96%    Intake/Output Summary (Last 24 hours) at 11/27/2022 1354 Last data filed at 11/27/2022 1100 Gross per 24 hour  Intake 692 ml  Output 4600 ml  Net -3908 ml   Filed Weights   11/25/22 0500 11/26/22 0132 11/27/22 0111  Weight: (!) 181.4 kg (!) 182.2 kg (!) 179.4 kg    Exam:  General:  Appears calm and comfortable and is in NAD, on room air, sitting up in the bedside chair Eyes:  EOMI, normal lids, iris ENT:  grossly normal hearing, lips & tongue, mmm Neck:  no LAD, masses or thyromegaly Cardiovascular:  RRR, no m/r/g. No LE edema.  Respiratory:   CTA bilaterally with no wheezes/rales/rhonchi.  Normal respiratory effort. Abdomen:  soft, NT, ND Skin:  no rash or induration seen on limited exam Musculoskeletal:  grossly normal tone BUE/BLE, good ROM, no bony abnormality Psychiatric:  grossly normal mood and affect, speech fluent and appropriate, AOx3 Neurologic:  CN 2-12 grossly intact, moves all extremities in coordinated fashion  Data Reviewed: I have reviewed the patient's lab results since admission.  Pertinent labs for today include:   K+ 3.2 Unremarkable CBC     Family Communication: None present  Disposition: Status is: Inpatient Remains inpatient appropriate because: weaning off O2     Time spent: 35 minutes  Unresulted Labs (From admission, onward)     Start     Ordered   11/28/22 0500  Basic metabolic panel  Tomorrow morning,   R       Question:  Specimen collection method  Answer:  Lab=Lab collect    11/27/22 1354             Author: Jonah Blue, MD 11/27/2022 1:54 PM  For on call review [email or web address].

## 2022-11-27 NOTE — Plan of Care (Signed)
  Problem: Coping: Goal: Ability to adjust to condition or change in health will improve Outcome: Progressing   Problem: Fluid Volume: Goal: Ability to maintain a balanced intake and output will improve Outcome: Progressing   Problem: Health Behavior/Discharge Planning: Goal: Ability to identify and utilize available resources and services will improve Outcome: Progressing   Problem: Nutritional: Goal: Maintenance of adequate nutrition will improve Outcome: Progressing   Problem: Tissue Perfusion: Goal: Adequacy of tissue perfusion will improve Outcome: Progressing   Problem: Clinical Measurements: Goal: Ability to maintain clinical measurements within normal limits will improve Outcome: Progressing   Problem: Pain Managment: Goal: General experience of comfort will improve Outcome: Progressing   Problem: Safety: Goal: Ability to remain free from injury will improve Outcome: Progressing

## 2022-11-28 ENCOUNTER — Other Ambulatory Visit (HOSPITAL_COMMUNITY): Payer: Self-pay

## 2022-11-28 DIAGNOSIS — J9601 Acute respiratory failure with hypoxia: Secondary | ICD-10-CM | POA: Diagnosis not present

## 2022-11-28 LAB — BASIC METABOLIC PANEL
Anion gap: 10 (ref 5–15)
BUN: 24 mg/dL — ABNORMAL HIGH (ref 6–20)
CO2: 28 mmol/L (ref 22–32)
Calcium: 8.4 mg/dL — ABNORMAL LOW (ref 8.9–10.3)
Chloride: 100 mmol/L (ref 98–111)
Creatinine, Ser: 0.92 mg/dL (ref 0.61–1.24)
GFR, Estimated: >60 mL/min (ref >60)
Glucose, Bld: 96 mg/dL (ref 70–99)
Potassium: 3.1 mmol/L — ABNORMAL LOW (ref 3.5–5.1)
Sodium: 138 mmol/L (ref 135–145)

## 2022-11-28 LAB — GLUCOSE, CAPILLARY: Glucose-Capillary: 97 mg/dL (ref 70–99)

## 2022-11-28 MED ORDER — POTASSIUM CHLORIDE CRYS ER 20 MEQ PO TBCR
40.0000 meq | EXTENDED_RELEASE_TABLET | Freq: Once | ORAL | Status: AC
  Administered 2022-11-28: 40 meq via ORAL
  Filled 2022-11-28: qty 2

## 2022-11-28 MED ORDER — GUAIFENESIN ER 600 MG PO TB12
1200.0000 mg | ORAL_TABLET | Freq: Two times a day (BID) | ORAL | 0 refills | Status: DC | PRN
  Filled 2022-11-28: qty 26, 7d supply, fill #0

## 2022-11-28 MED ORDER — BENZONATATE 200 MG PO CAPS
200.0000 mg | ORAL_CAPSULE | Freq: Three times a day (TID) | ORAL | 0 refills | Status: DC | PRN
  Filled 2022-11-28 (×2): qty 20, 7d supply, fill #0

## 2022-11-28 MED ORDER — ADULT MULTIVITAMIN W/MINERALS CH
1.0000 | ORAL_TABLET | Freq: Every day | ORAL | 0 refills | Status: DC
  Filled 2022-11-28: qty 30, 30d supply, fill #0

## 2022-11-28 NOTE — Progress Notes (Signed)
Mobility Specialist - Progress Note   11/28/22 1119  Mobility  Activity Ambulated with assistance in hallway  Level of Assistance Modified independent, requires aide device or extra time  Assistive Device Cane  Distance Ambulated (ft) 300 ft  Range of Motion/Exercises Active  Activity Response Tolerated well  Mobility Referral Yes  $Mobility charge 1 Mobility  Mobility Specialist Start Time (ACUTE ONLY) 1045  Mobility Specialist Stop Time (ACUTE ONLY) 1055  Mobility Specialist Time Calculation (min) (ACUTE ONLY) 10 min   Pt received in chair and agreed to mobility, had no issues throughout session. On RA, pt ambulated to end of hallway where he took a standing rest break less than 2 minutes.   SpO2% was 92%.  Returned to chair with all needs met.  Marilynne Halsted Mobility Specialist

## 2022-11-28 NOTE — Discharge Summary (Signed)
Physician Discharge Summary   Patient: Christian [name] MRN: [id number] DOB: [birth year]  Admit date:     11/18/2022  Discharge date: 11/28/22  Discharge Physician: Jonah Blue   PCP: [name], MD   Recommendations at discharge:   Chew food slowly and carefully Continue cough medication as needed Follow up with Dr. Andrey Campanile in 1-2 weeks  Discharge Diagnoses: Principal Problem:   Acute respiratory failure with hypoxemia [facility]) Active Problems:   Prediabetes   Morbid obesity with BMI of 50.0-59.9, adult (HCC)   Essential hypertension   Chronic diastolic CHF (congestive heart failure) (HCC)   Depression   Asthma   Aspiration pneumonia [facility])    Hospital Course: 47 yo with h/o chronic diastolic CHF, depression/anxiety, morbid obesity, and OSA who presented ton 9/20 with dyspnea after he choked with a piece of chicken requiring the Heimlich maneuver.  He initially require NRB mask at 15 L/min but is now down to 4 LPM.  He has had a previous episode of aspiration in the past. CXR with mild to moderate severity right-sided infiltrate.  CTA chest no evidence of PE.  There is moderate to marked severity multifocal infiltrates throughout the right upper lobe, right middle lobe and right lower lobe.  Mild left upper lobe and left lower lobe multifocal infiltrates.  Very small right pleural effusion.  Mild mediastinal and right hilar lymphadenopathy.  On antibiotics with SLP evaluation. Repeat CXR reviewed by Pulmonary and they feel it is the same and that his RLL collapse is chronic. Continue O2 weaning as tolerated, currently on 1L.  Assessment and Plan:  Acute Respiratory Failure with Hypoxemia in the setting of  Aspiration Pneumonia, Superimposed on Asthma, slowly improving  Patient had a witnessed choking episode on food requiring the Heimlich maneuver He has been persistently hypoxic since  Imaging with mulitfocal infiltrates 87% on room air at times, O2 goal is >88% based on  probable OSA/OHS Wean O2 as tolerated Scheduled and as needed bronchodilators. No glucocorticoids Treated with Unasyn for 7 days, has completed abx as of 9/28 Aspiration Precautions SLP Evaluation done and apparently patient inhales his fluids and rapidly eats given that he wants to eat more food at the shelter; needs to eat slowly and carefully Added Brovana and budesonide nebs, resume Advair at time of dc Add flutter valve, incentive spirometry and guaifenesin 1200 p.o. twice daily If patient does not continue to improve and worsens will need formal Pulmonary evaluation but for now we will continue oxygen weaning TOC consulted for discussion of his social determinants of health concerns - he cannot discharge to shelter with continuous O2 supplementation (can't use it at night and may have difficulty charging it) so will either have to wean off or will remain hospitalized until another arrangement can be made He appears to be weaning off as of 9/29 and can be discharged on 9/30 back to the shelter   Essential Hypertension On Torsemide 40 mg p.o. twice daily.   Chronic Diastolic CHF (congestive heart failure) (HCC) Euvolemic, No signs of volume overload. Resume torsemide 40 mg p.o. twice daily Might benefit from an ACE inhibitor and/or beta-blocker as an outpatient   Depression Continue Sertraline 100 mg p.o. daily and Trazodone 100 mg p.o. at bedtime as needed.   PreDiabetes Hemoglobin A1c 5.8, good control No medications needed   Obstructive Sleep Apnea Continue with CPAP with oxygen bleed in; only using the CPAP at the facility given that the facility does not have the ability or to space  further oxygen concentrator   Morbid Obesity Complicates overall prognosis and care Body mass index is 52.53 kg/m.Marland Kitchen  Weight loss should be encouraged Outpatient PCP/bariatric medicine/bariatric surgery f/u encouraged  Nutrition Problem: Increased nutrient needs Etiology: chronic illness  (CHF) Signs/Symptoms: estimated needs Interventions: MVI, Refer to RD note for recommendations     Consultants: RT Nutrition SLP Pulmonology (telephone only)   Procedures: None   Antibiotics: Unasyn 9/21-28   30 Day Unplanned Readmission Risk Score    Flowsheet Row ED to Hosp-Admission (Current) from 11/18/2022 in [facility]  30 Day Unplanned Readmission Risk Score (%) 21.27 Filed at 11/28/2022 0801       This score is the patient's risk of an unplanned readmission within 30 days of being discharged (0 -100%). The score is based on dignosis, age, lab data, medications, orders, and past utilization.   Low:  0-14.9   Medium: 15-21.9   High: 22-29.9   Extreme: 30 and above         Pain control - [place] Controlled Substance Reporting System database was reviewed. and patient was instructed, not to drive, operate heavy machinery, perform activities at heights, swimming or participation in water activities or provide baby-sitting services while on Pain, Sleep and Anxiety Medications; until their outpatient Physician has advised to do so again. Also recommended to not to take more than prescribed Pain, Sleep and Anxiety Medications.   Disposition:  Shelter Diet recommendation:  Carb modified diet DISCHARGE MEDICATION: Allergies as of 11/28/2022       Reactions   Shellfish Allergy Nausea Only        Medication List     STOP taking these medications    OXYGEN   vitamin B-12 100 MCG tablet Commonly known as: CYANOCOBALAMIN       TAKE these medications    Advair HFA 115-21 MCG/ACT inhaler Generic drug: fluticasone-salmeterol Inhale 2 puffs into the lungs 2 (two) times daily.   albuterol 108 (90 Base) MCG/ACT inhaler Commonly known as: VENTOLIN HFA Inhale 2 puffs into the lungs every 6 (six) hours as needed for wheezing or shortness of breath.   benzonatate 200 MG capsule Commonly known as: TESSALON Take 1  capsule (200 mg total) by mouth 3 (three) times daily as needed for cough.   fluticasone 50 MCG/ACT nasal spray Commonly known as: FLONASE Spray 2 sprays into both nostrils once daily. What changed:  when to take this reasons to take this   guaiFENesin 600 MG 12 hr tablet Commonly known as: MUCINEX Take 2 tablets (1,200 mg total) by mouth 2 (two) times daily as needed.   meloxicam 15 MG tablet Commonly known as: MOBIC Take 1 tablet (15 mg total) by mouth daily.   multivitamin with minerals Tabs tablet Take 1 tablet by mouth daily. Start taking on: November 29, 2022   sertraline 100 MG tablet Commonly known as: ZOLOFT Take 1 and 1/2 tablet (150 mg total) by mouth daily.   torsemide 20 MG tablet Commonly known as: DEMADEX Take 2 tablets (40 mg total) by mouth 2 (two) times daily.   traZODone 50 MG tablet Commonly known as: DESYREL Take 1 tablet (50 mg total) by mouth at bedtime.        Discharge Exam: Filed Weights   11/26/22 0132 11/27/22 0111 11/28/22 0425  Weight: (!) 182.2 kg (!) 179.4 kg (!) 175.2 kg     Subjective: Feeling ok, off O2, ready to go.   Objective: Vitals:  11/28/22 0432 11/28/22 0758  BP: 104/69   Pulse: (!) 58   Resp: 18   Temp: (!) 97.4 F (36.3 C)   SpO2: 95% 95%    Intake/Output Summary (Last 24 hours) at 11/28/2022 1126 Last data filed at 11/28/2022 0900 Gross per 24 hour  Intake 937 ml  Output 2900 ml  Net -1963 ml   Filed Weights   11/26/22 0132 11/27/22 0111 11/28/22 0425  Weight: (!) 182.2 kg (!) 179.4 kg (!) 175.2 kg    Exam:  General:  Appears calm and comfortable and is in NAD, on room air, sitting up in the bedside chair Eyes:  EOMI, normal lids, iris ENT:  grossly normal hearing, lips & tongue, mmm Neck:  no LAD, masses or thyromegaly Cardiovascular:  RRR, no m/r/g. Chronic LE edema.  Respiratory:   CTA bilaterally with no wheezes/rales/rhonchi.  Normal respiratory effort. Abdomen:  soft, NT, ND Skin:  no rash  or induration seen on limited exam Musculoskeletal:  grossly normal tone BUE/BLE, good ROM, no bony abnormality Psychiatric:  grossly normal mood and affect, speech fluent and appropriate, AOx3 Neurologic:  CN 2-12 grossly intact, moves all extremities in coordinated fashion  Data Reviewed: I have reviewed the patient's lab results since admission.  Pertinent labs for today include:   K+ 3.1 BUN 24    Condition at discharge: stable  The results of significant diagnostics from this hospitalization (including imaging, microbiology, ancillary and laboratory) are listed below for reference.   Imaging Studies: DG CHEST PORT 1 VIEW  Result Date: 11/23/2022 CLINICAL DATA:  Shortness of breath EXAM: PORTABLE CHEST 1 VIEW COMPARISON:  Chest radiograph 1 day prior FINDINGS: The cardiomediastinal silhouette is stable, allowing for rightward patient rotation. Platelike opacities in the right perihilar region and patchy airspace disease in the remainder of the right lung are unchanged. The left lung remains clear. There is no new or worsening focal airspace disease. There is no pleural effusion or pneumothorax There is no acute osseous abnormality. IMPRESSION: No significant interval change since the study from 1 day prior. Unchanged platelike opacities in the right midlung and patchy airspace disease throughout the remainder of the right lung. Electronically Signed   By: Lesia Hausen M.D.   On: 11/23/2022 10:23   DG CHEST PORT 1 VIEW  Result Date: 11/22/2022 CLINICAL DATA:  Shortness of breath EXAM: PORTABLE CHEST 1 VIEW COMPARISON:  11/21/2022 FINDINGS: Interval progression of right parahilar and basilar patchy airspace disease, likely atelectatic given the rapid interval appearance although pneumonia is not excluded. Left lung clear. The cardiopericardial silhouette is within normal limits for size. No acute bony abnormality. IMPRESSION: Interval progression of right parahilar and basilar patchy  airspace disease, likely atelectatic although pneumonia not excluded. Electronically Signed   By: Kennith Center M.D.   On: 11/22/2022 13:45   DG CHEST PORT 1 VIEW  Result Date: 11/21/2022 CLINICAL DATA:  Shortness of breath.  Aspiration pneumonia. EXAM: PORTABLE CHEST 1 VIEW COMPARISON:  11/19/2022 FINDINGS: Mild patchy density persists at the left base. Infiltrate in collapse of the right lower lobe and possibly right middle lobe as seen previously. Some clearing and better aeration of the right upper lobe. No worsening or new finding. IMPRESSION: Persistent infiltrate and collapse of the right lower lobe and possibly right middle lobe. Some clearing and better aeration of the right upper lobe. No worsening or new finding. Electronically Signed   By: Paulina Fusi M.D.   On: 11/21/2022 07:37   CT Angio Chest  PE W and/or Wo Contrast  Result Date: 11/19/2022 CLINICAL DATA:  Shortness of breath with possible aspiration wall eating chicken wings. EXAM: CT ANGIOGRAPHY CHEST WITH CONTRAST TECHNIQUE: Multidetector CT imaging of the chest was performed using the standard protocol during bolus administration of intravenous contrast. Multiplanar CT image reconstructions and MIPs were obtained to evaluate the vascular anatomy. RADIATION DOSE REDUCTION: This exam was performed according to the departmental dose-optimization program which includes automated exposure control, adjustment of the mA and/or kV according to patient size and/or use of iterative reconstruction technique. CONTRAST:  75mL OMNIPAQUE IOHEXOL 350 MG/ML SOLN COMPARISON:  June 14, 2022 FINDINGS: Cardiovascular: The thoracic aorta is normal in appearance. Satisfactory opacification of the pulmonary arteries to the segmental level. No evidence of pulmonary embolism. Normal heart size. No pericardial effusion. Mediastinum/Nodes: There is mild paratracheal, right hilar and subcarinal lymphadenopathy. Thyroid gland, trachea, and esophagus demonstrate no  significant findings. Lungs/Pleura: Moderate to marked severity multifocal infiltrates are seen throughout the right upper lobe, right middle lobe and right lower lobe. Mild left upper lobe and left lower lobe multifocal infiltrates are also noted. Mild right-sided volume loss is seen. There is a very small right pleural effusion. No pneumothorax is identified. Upper Abdomen: A 3.5 cm diameter simple cyst is seen within the anterolateral aspect of the mid left kidney. Musculoskeletal: No chest wall abnormality. No acute or significant osseous findings. Review of the MIP images confirms the above findings. IMPRESSION: 1. No evidence of pulmonary embolism. 2. Moderate to marked severity multifocal infiltrates throughout the right upper lobe, right middle lobe and right lower lobe. 3. Mild left upper lobe and left lower lobe multifocal infiltrates. 4. Very small right pleural effusion. 5. Mild mediastinal and right hilar lymphadenopathy. 6. Simple left renal cyst. No follow-up imaging is recommended. This recommendation follows ACR consensus guidelines: Management of the Incidental Renal Mass on CT: A White Paper of the ACR Incidental Findings Committee. J Am Coll Radiol [phone]. Electronically Signed   By: Aram Candela M.D.   On: 11/19/2022 03:24   DG Chest Port 1 View  Result Date: 11/19/2022 CLINICAL DATA:  Shortness of breath and possible aspiration while eating. EXAM: PORTABLE CHEST 1 VIEW COMPARISON:  October 17, 2022 FINDINGS: The cardiac silhouette is mildly enlarged which may be, in part, technical in origin. Mild to moderate severity infiltrate is seen within the mid right lung and right lung base. Right-sided volume loss is also noted. Mild elevation of the right hemidiaphragm is again seen. No pleural effusion or pneumothorax is identified. The visualized skeletal structures are unremarkable. IMPRESSION: Mild to moderate severity right-sided infiltrate. Electronically Signed   By: Aram Candela M.D.   On: 11/19/2022 01:54    Microbiology: Results for orders placed or performed during the hospital encounter of 11/18/22  Resp panel by RT-PCR (RSV, Flu A&B, Covid) Anterior Nasal Swab     Status: None   Collection Time: 11/19/22  1:15 AM   Specimen: Anterior Nasal Swab  Result Value Ref Range Status   SARS Coronavirus 2 by RT PCR NEGATIVE NEGATIVE Final    Comment: (NOTE) SARS-CoV-2 target nucleic acids are NOT DETECTED.  The SARS-CoV-2 RNA is generally detectable in upper respiratory specimens during the acute phase of infection. The lowest concentration of SARS-CoV-2 viral copies this assay can detect is 138 copies/mL. A negative result does not preclude SARS-Cov-2 infection and should not be used as the sole basis for treatment or other patient management decisions. A negative result may occur  with  improper specimen collection/handling, submission of specimen other than nasopharyngeal swab, presence of viral mutation(s) within the areas targeted by this assay, and inadequate number of viral copies(<138 copies/mL). A negative result must be combined with clinical observations, patient history, and epidemiological information. The expected result is Negative.  Fact Sheet for Patients:  BloggerCourse.com  Fact Sheet for Healthcare Providers:  SeriousBroker.it  This test is no t yet approved or cleared by the [place] FDA and  has been authorized for detection and/or diagnosis of SARS-CoV-2 by FDA under an Emergency Use Authorization (EUA). This EUA will remain  in effect (meaning this test can be used) for the duration of the COVID-19 declaration under Section 564(b)(1) of the Act, 21 U.S.C.section 360bbb-3(b)(1), unless the authorization is terminated  or revoked sooner.       Influenza A by PCR NEGATIVE NEGATIVE Final   Influenza B by PCR NEGATIVE NEGATIVE Final    Comment: (NOTE) The Xpert Xpress  SARS-CoV-2/FLU/RSV plus assay is intended as an aid in the diagnosis of influenza from Nasopharyngeal swab specimens and should not be used as a sole basis for treatment. Nasal washings and aspirates are unacceptable for Xpert Xpress SARS-CoV-2/FLU/RSV testing.  Fact Sheet for Patients: BloggerCourse.com  Fact Sheet for Healthcare Providers: SeriousBroker.it  This test is not yet approved or cleared by the [place] FDA and has been authorized for detection and/or diagnosis of SARS-CoV-2 by FDA under an Emergency Use Authorization (EUA). This EUA will remain in effect (meaning this test can be used) for the duration of the COVID-19 declaration under Section 564(b)(1) of the Act, 21 U.S.C. section 360bbb-3(b)(1), unless the authorization is terminated or revoked.     Resp Syncytial Virus by PCR NEGATIVE NEGATIVE Final    Comment: (NOTE) Fact Sheet for Patients: BloggerCourse.com  Fact Sheet for Healthcare Providers: SeriousBroker.it  This test is not yet approved or cleared by the [place] FDA and has been authorized for detection and/or diagnosis of SARS-CoV-2 by FDA under an Emergency Use Authorization (EUA). This EUA will remain in effect (meaning this test can be used) for the duration of the COVID-19 declaration under Section 564(b)(1) of the Act, 21 U.S.C. section 360bbb-3(b)(1), unless the authorization is terminated or revoked.  Performed at [facility], [address] [place], [place] [address]   Blood Culture (routine x 2)     Status: None   Collection Time: 11/19/22  3:22 AM   Specimen: BLOOD  Result Value Ref Range Status   Specimen Description   Final    BLOOD SITE NOT SPECIFIED Performed at [facility], [address], [place], [place] [address]    Special Requests   Final    Blood Culture adequate volume BOTTLES DRAWN  AEROBIC AND ANAEROBIC Performed at [facility], [address], [place], [place] [address]    Culture   Final    NO GROWTH 5 DAYS Performed at [facility], [address] [place], [place] [address]    Report Status 11/24/2022 FINAL  Final  Blood Culture (routine x 2)     Status: None   Collection Time: 11/19/22  3:22 AM   Specimen: BLOOD  Result Value Ref Range Status   Specimen Description   Final    BLOOD SITE NOT SPECIFIED Performed at [facility], [address] [place], [place] [address]    Special Requests   Final    Blood Culture adequate volume BOTTLES DRAWN  AEROBIC AND ANAEROBIC Performed at [facility], [address] [place], [place] [address]    Culture   Final    NO GROWTH 5 DAYS Performed at [facility], [address] [place], [place] [address]    Report Status 11/24/2022 FINAL  Final    Labs: CBC: Recent Labs  Lab 11/22/22 1004 11/23/22 0705 11/24/22 0506 11/26/22 0728 11/27/22 0657  WBC 7.2 8.1 7.9 8.0 7.7  NEUTROABS 5.1 5.6 5.2 5.2 4.8  HGB 12.7* 13.5 13.0 13.1 12.9*  HCT 40.1 43.0 41.3 42.0 41.3  MCV 82.9 84.3 85.0 83.8 83.3  PLT 289 332 299 341 354   Basic Metabolic Panel: Recent Labs  Lab 11/22/22 1004 11/23/22 0705 11/24/22 0506 11/26/22 0728 11/27/22 0657 11/28/22 0453  NA 138 138 143 144 141 138  K 3.4* 3.4* 3.7 3.4* 3.2* 3.1*  CL 99 98 101 103 102 100  CO2 30 29 32 31 29 28   GLUCOSE 123* 106* 102* 93 92 96  BUN 18 23* 21* 21* 21* 24*  CREATININE 0.94 0.95 0.99 0.98 1.02 0.92  CALCIUM 8.3* 8.3* 8.4* 8.6* 8.4* 8.4*  MG 2.3 2.4  --   --   --   --   PHOS 3.4 3.9  --   --   --   --    Liver Function Tests: Recent Labs  Lab 11/22/22 1004 11/23/22 0705  AST 15 16  ALT 17 18  ALKPHOS 68 68  BILITOT 1.0 0.7  PROT 7.2 7.1  ALBUMIN 3.3* 3.5   CBG: Recent Labs  Lab 11/27/22 0836 11/27/22 1142 11/27/22 1626 11/27/22 2109 11/28/22 0723  GLUCAP 91 103* 129*  132* 97    Discharge time spent: greater than 30 minutes.  Signed: Jonah Blue, MD [facility] 11/28/2022

## 2022-11-29 ENCOUNTER — Telehealth: Payer: Self-pay

## 2022-11-29 ENCOUNTER — Ambulatory Visit (INDEPENDENT_AMBULATORY_CARE_PROVIDER_SITE_OTHER): Payer: MEDICAID | Admitting: Licensed Clinical Social Worker

## 2022-11-29 DIAGNOSIS — F411 Generalized anxiety disorder: Secondary | ICD-10-CM

## 2022-11-29 DIAGNOSIS — F331 Major depressive disorder, recurrent, moderate: Secondary | ICD-10-CM

## 2022-11-29 NOTE — Transitions of Care (Post Inpatient/ED Visit) (Unsigned)
11/29/2022  Name: Christian [name] MRN: [id number] DOB: 09/07/1975  Today's TOC FU Call Status: Today's TOC FU Call Status:: Unsuccessful Call (1st Attempt) Unsuccessful Call (1st Attempt) Date: 11/29/22  Attempted to reach the patient regarding the most recent Inpatient/ED visit.  Follow Up Plan: Additional outreach attempts will be made to reach the patient to complete the Transitions of Care (Post Inpatient/ED visit) call.   Signature Karena Addison, LPN [facility] Nurse Health Advisor Direct Dial [phone]

## 2022-11-29 NOTE — Progress Notes (Signed)
THERAPIST PROGRESS NOTE  Virtual Visit via Video Note  I connected with Christian [name] on 11/29/22 at 11:00 AM EDT by a video enabled telemedicine application and verified that I am speaking with the correct person using two identifiers.  Location: Patient: Christian [name]  Provider: Provider Home    I discussed the limitations of evaluation and management by telemedicine and the availability of in person appointments. The patient expressed understanding and agreed to proceed.   I discussed the assessment and treatment plan with the patient. The patient was provided an opportunity to ask questions and all were answered. The patient agreed with the plan and demonstrated an understanding of the instructions.   The patient was advised to call back or seek an in-person evaluation if the symptoms worsen or if the condition fails to improve as anticipated.  I provided 30 minutes of non-face-to-face time during this encounter.   Weber Cooks, LCSW   Participation Level: Active  Behavioral Response: CasualAlertAnxious and Depressed  Type of Therapy: Individual Therapy  Treatment Goals addressed:   Active     Anxiety     LTG: Christian [name] will score less than 5 on the Generalized Anxiety Disorder 7 Scale (GAD-7)  (Completed/Met)     Start:  05/11/22    Expected End:  10/28/22    Resolved:  08/17/22    Goal Note     Scpre taken today and last session scored below a 5 each time          STG: Christian [name] will complete at least 80% of assigned homework  (Progressing)     Start:  05/11/22    Expected End:  03/31/23         STG: Christian [name] will practice problem solving skills 3 times per week for the next 4 weeks.  (Progressing)     Start:  05/11/22    Expected End:  03/31/23         identify 3 trigger for anxiety  (Progressing)     Start:  05/11/22    Expected End:  03/31/23       Goal Note     Illness          create  3 coping skills for depression      Start:   05/11/22    Expected End:  03/31/23       Goal Note     Walking          Discuss risks and benefits of medication treatment options for this problem and prescribe as indicated     Start:  05/11/22         Encourage Christian [name] to take psychotropic medication(s) as prescribed     Start:  05/11/22         Work with Christian [name] to identify a minimum of 3 alternative coping behaviors to avoidance.      Start:  05/11/22         Create a weekly activity schedule     Start:  05/11/22         Review results of GAD-7 with Christian [name] to track progress (Completed)     Start:  05/11/22    End:  06/28/22    Intervention Note     Reviewed with patient during session.          Work with Christian [name] to track symptoms, triggers, and/or skill use through a mood chart, diary card, or journal (Completed)     Start:  05/11/22  End:  06/07/22      Perform psychoeducation regarding anxiety disorders (Completed)     Start:  05/11/22    End:  06/07/22      Provide Christian [name] with educational information and reading material on anxiety, its causes, and symptoms.  (Completed)     Start:  05/11/22    End:  06/07/22      Work with patient individually to identify the major components of a recent episode of anxiety: physical symptoms, major thoughts and images, and major behaviors they experienced (Completed)     Start:  05/11/22    End:  06/07/22      Work with Christian [name] to identify 3 personal goals for managing their anxiety to work on during current treatment.  (Completed)     Start:  05/11/22    End:  06/28/22      Work with Christian [name] to identify a minimum of 3 consequences of avoidance.  (Completed)     Start:  05/11/22    End:  06/28/22        OP Depression     LTG: Reduce frequency, intensity, and duration of depression symptoms so that daily functioning is improved (Progressing)     Start:  05/11/22    Expected End:  03/31/23         LTG: Increase coping skills to manage depression and  improve ability to perform daily activities (Progressing)     Start:  05/11/22    Expected End:  03/31/23         LTG: Christian [name] will score less than 10 on the Patient Health Questionnaire (PHQ-9)  (Completed/Met)     Start:  05/11/22    Expected End:  10/28/22    Resolved:  08/17/22    Goal Note     Scpre taken today and last session scored below a 5 each time          Work with Christian [name] to track symptoms, triggers, and/or skill use through a mood chart, diary card, or journal (Completed)     Start:  05/11/22    End:  06/07/22      Therapist will administer the PHQ-9 at weekly intervals for the next 8 weeks (Completed)     Start:  05/11/22    End:  06/28/22      Work with Christian [name] to identify the major components of a recent episode of depression: physical symptoms, major thoughts and images, and major behaviors they experienced (Completed)     Start:  05/11/22    End:  06/07/22         ProgressTowards Goals: Progressing  Interventions: CBT, Motivational Interviewing, and Supportive  Summary: Christian [name] is a 47 y.o. male who presents with anxious mood\affect.  Patient was pleasant, cooperative, maintained good eye contact.  Engaged well in therapy session was dressed casually.   Patient comes in today with primary stressor as illness.  Patient reports that he was in the hospital for over 10 days with pneumonia.  Patient reports that he was discharged yesterday.  Patient reports fatigue.  LCSW administered G AD-7 LCSW administered a PHQ-9.  LCSW notes a decrease in both scores.  Patient endorses symptoms for tension, worry, hopelessness, worthlessness, and irritability.  This stems from waiting on his Social Security disability so that he can possibly obtain housing.  As patient is currently staying in a shelter.  Patient reports needing to improve on his coping skills for anxiety and depression.   Suicidal/Homicidal: Nowithout intent/plan  Therapist Response:      Interventions\plan: LCSW psycho analytic therapy for patient to express thoughts, feelings and concerns.  LCSW used supportive therapy for praise and encouragement.  LCSW used unconditional positive regard utilizing nonjudgmental stance.  LCSW went over coping skills for deep breathing.  This is to help decrease anxiety and depression.  Plan: Return again in 3 weeks.  Diagnosis: MDD (major depressive disorder), recurrent episode, moderate (HCC)  GAD (generalized anxiety disorder)  Collaboration of Care: Other None today   Patient/Guardian was advised Release of Information must be obtained prior to any record release in order to collaborate their care with an outside provider. Patient/Guardian was advised if they have not already done so to contact the registration department to sign all necessary forms in order for [place] to release information regarding their care.   Consent: Patient/Guardian gives verbal consent for treatment and assignment of benefits for services provided during this visit. Patient/Guardian expressed understanding and agreed to proceed.   Weber Cooks, LCSW 11/29/2022

## 2022-11-29 NOTE — Telephone Encounter (Signed)
At the request of Jessie Foot RMA with Dr Andrey Campanile, I called the patient to inquire about SDOH needs - housing, rollator, and medication assistance.  He was just discharged from the hospital yesterday.  Message left with call back requested.

## 2022-11-30 ENCOUNTER — Telehealth: Payer: Self-pay | Admitting: Family Medicine

## 2022-11-30 NOTE — Transitions of Care (Post Inpatient/ED Visit) (Signed)
11/30/2022  Name: Christian [name] MRN: [id number] DOB: 23-Jun-1975  Today's TOC FU Call Status: Today's TOC FU Call Status:: Successful TOC FU Call Completed Unsuccessful Call (1st Attempt) Date: 11/29/22 [facility] FU Call Complete Date: 11/30/22 Patient's Name and Date of Birth confirmed.  Transition Care Management Follow-up Telephone Call Date of Discharge: 11/28/22 Discharge Facility: [facility]) Type of Discharge: Inpatient Admission Primary Inpatient Discharge Diagnosis:: pneumonitis How have you been since you were released from the hospital?: Better Any questions or concerns?: No  Items Reviewed: Did you receive and understand the discharge instructions provided?: Yes Medications obtained,verified, and reconciled?: Yes (Medications Reviewed) Any new allergies since your discharge?: No Dietary orders reviewed?: Yes Do you have support at home?: Yes People in Home: significant other  Medications Reviewed Today: Medications Reviewed Today     Reviewed by Karena Addison, LPN (Licensed Practical Nurse) on 11/30/22 at 1504  Med List Status: <None>   Medication Order Taking? Sig Documenting Provider Last Dose Status Informant  albuterol (VENTOLIN HFA) 108 (90 Base) MCG/ACT inhaler [id number] No Inhale 2 puffs into the lungs every 6 (six) hours as needed for wheezing or shortness of breath. Parrett, Virgel Bouquet, NP unkn Active Self, Pharmacy Records  benzonatate (TESSALON) 200 MG capsule [id number]  Take 1 capsule (200 mg total) by mouth 3 (three) times daily as needed for cough. Jonah Blue, MD  Active   cholecalciferol (VITAMIN D3) tablet 1,000 Units [id number]   Hartley Barefoot A, MD  Active   fluticasone (FLONASE) 50 MCG/ACT nasal spray [id number] No Spray 2 sprays into both nostrils once daily.  Patient taking differently: Place 2 sprays into both nostrils daily as needed for allergies.   [name], MD unkn Active Self, Pharmacy Records  fluticasone-salmeterol [facility]) [id number] MCG/ACT inhaler [id number] No Inhale 2 puffs into the lungs 2 (two) times daily. Parrett, Virgel Bouquet, NP 11/18/2022 Active Self, Pharmacy Records  guaiFENesin (MUCINEX) 600 MG 12 hr tablet [id number]  Take 2 tablets (1,200 mg total) by mouth 2 (two) times daily as needed. Jonah Blue, MD  Active   meloxicam [facility]) 15 MG tablet [id number] No Take 1 tablet (15 mg total) by mouth daily. [name], MD Past Week Active Self, Pharmacy Records  Multiple Vitamin (MULTIVITAMIN WITH MINERALS) TABS tablet [id number]  Take 1 tablet by mouth daily. Jonah Blue, MD  Active   sertraline (ZOLOFT) 100 MG tablet [id number] No Take 1 and 1/2 tablet (150 mg total) by mouth daily. Meta Hatchet, [place] 11/18/2022 Active Self, Pharmacy Records  torsemide (DEMADEX) 20 MG tablet [id number] No Take 2 tablets (40 mg total) by mouth 2 (two) times daily. [name], MD 11/14/2022 Active Self, Pharmacy Records  traZODone (DESYREL) 50 MG tablet [id number] No Take 1 tablet (50 mg total) by mouth at bedtime. Meta Hatchet, PA unkn Active Self, Pharmacy Records            Home Care and Equipment/Supplies: Were Home Health Services Ordered?: NA Any new equipment or medical supplies ordered?: NA  Functional Questionnaire: Do you need assistance with bathing/showering or dressing?: No Do you need assistance with meal preparation?: No Do you need assistance with eating?: No Do you have difficulty maintaining continence: No Do you need assistance with getting out of bed/getting out of a chair/moving?: No Do you have difficulty managing or taking your medications?: No  Follow up appointments reviewed: PCP Follow-up appointment confirmed?: Yes Date of PCP follow-up appointment?: 12/09/22 Follow-up Provider: [facility] Follow-up appointment confirmed?:  NA Do you need transportation to your follow-up appointment?: No Do you understand care options if your condition(s) worsen?: Yes-patient  verbalized understanding    SIGNATURE Karena Addison, LPN [facility] Nurse Health Advisor Direct Dial [phone]

## 2022-11-30 NOTE — Telephone Encounter (Signed)
Copied from CRM [id number]. Topic: General - Other >> Nov 30, 2022  2:17 PM Christian [name] wrote: Reason for CRM: The patient has returned a missed phone call from J. Brazeau  Please contact further when possible

## 2022-11-30 NOTE — Telephone Encounter (Signed)
Call returned to patient, message left with call back requested.  

## 2022-12-01 ENCOUNTER — Other Ambulatory Visit (HOSPITAL_COMMUNITY): Payer: Self-pay

## 2022-12-01 LAB — BASIC METABOLIC PANEL: Glucose: 120

## 2022-12-01 NOTE — Congregational Nurse Program (Signed)
  Dept: [phone]   Congregational Nurse Program Note  Date of Encounter: 12/01/2022  Clinic visit to follow-up from hospital stay for aspiration pneumonia.  BP 135/83, pulse 71, temperature 98.6, respirations 16, O2 93%, lungs clear.  Blood glucose 120 as dinner, states he has family history of diabetes. Discussed normal blood glucose levels and managing blood glucose by carbohydrate intake. Past Medical History: Past Medical History:  Diagnosis Date   Allergic rhinitis    Anxiety    CHF (congestive heart failure) (HCC)    Depression    Depression    Elevated blood pressure reading without diagnosis of hypertension    History of chicken pox    Obesity    Pleural effusion    Sepsis (HCC)    Sleep apnea    O2 at night    Encounter Details:  CNP Questionnaire - 12/01/22 1550       Questionnaire   Ask client: Do you give verbal consent for me to treat you today? Yes    Student Assistance N/A    Location Patient Served  [facility]    Visit Setting with Client Organization    Patient Status Unhoused    Insurance Medicaid    Insurance/Financial Assistance Referral N/A    Medication Have Medication Insecurities    Medical Provider Yes    Screening Referrals Made N/A    Medical Referrals Made N/A    Medical Appointment Made N/A    Recently w/o PCP, now 1st time PCP visit completed due to CNs referral or appointment made N/A    Food Have Food Insecurities    Transportation Need transportation assistance    Housing/Utilities No permanent housing    Interpersonal Safety N/A    Interventions Advocate/Support;Reviewed Medications;Counsel;Educate    Abnormal to Normal Screening Since Last CN Visit N/A    Screenings CN Performed Blood Pressure;Blood Glucose;Pulse Ox    Sent Client to Lab for: N/A    Did client attend any of the following based off CNs referral or appointments made? N/A    ED Visit Averted N/A    Life-Saving Intervention Made N/A

## 2022-12-01 NOTE — Telephone Encounter (Signed)
I spoke to the patient and he confirmed that he is still staying at [facility] and he is working with a caseworker there, Joaquin Music, to find permanent housing. I told him that I would call [place] to see if there is anything that we an do to support his search for housing.  He has no income at the present time and is working with an attorney to appeal his disability denial. Without an income he will not be able to afford housing.    He has been seeing a behavioral health therapist and may possibly be eligible for the TCL program for supportive housing but I need to speak to his [employer] caseworker first.   His O2 has been discontinued but he still uses the CPAP at night.   He said he has not received a rollator yet.  He thinks this may be due to a change in his insurance at the time the initial order was placed. Dr Andrey Campanile, can you please submit another order for a rollator to [employer] [facility]?  They will request office visit notes to support the need for the rollator   His Medicaid is still in [facility] and he said he has not been able to switch it to [facility]. I told him that I can refer him to the [facility] DSS Medicaid Caseworkers to see if they can assist him.  There is no guarantee that they can help  but we can try.  He was in agreement and I sent an email to the caseworkers.   I called [facility]: [phone] and left a message requesting a call back.

## 2022-12-02 NOTE — Telephone Encounter (Signed)
Resubmitted form to [employer] [facility]

## 2022-12-05 LAB — BASIC METABOLIC PANEL: Glucose: 113

## 2022-12-06 ENCOUNTER — Telehealth: Payer: Self-pay | Admitting: Family Medicine

## 2022-12-06 NOTE — Congregational Nurse Program (Unsigned)
  Dept: [phone]   Congregational Nurse Program Note  Date of Encounter: 12/05/2022   Past Medical History: Past Medical History:  Diagnosis Date   Allergic rhinitis    Anxiety    CHF (congestive heart failure) (HCC)    Depression    Depression    Elevated blood pressure reading without diagnosis of hypertension    History of chicken pox    Obesity    Pleural effusion    Sepsis (HCC)    Sleep apnea    O2 at night    Encounter Details:  CNP Questionnaire - 12/05/22 1315       Questionnaire   Ask client: Do you give verbal consent for me to treat you today? Yes    Student Assistance N/A    Location Patient Served  [facility]    Visit Setting with Client Organization    Patient Status Unhoused    Insurance Medicaid    Insurance/Financial Assistance Referral N/A    Medication Have Medication Insecurities    Medical Provider Yes    Screening Referrals Made N/A    Medical Referrals Made [facility] PCP/Clinic    Medical Appointment Made N/A    Recently w/o PCP, now 1st time PCP visit completed due to CNs referral or appointment made N/A    Food Have Food Insecurities    Transportation Need transportation assistance    Housing/Utilities No permanent housing    Interpersonal Safety N/A    Interventions Advocate/Support;Reviewed Medications;Counsel;Educate;Case Management;Navigate Healthcare System    Abnormal to Normal Screening Since Last CN Visit N/A    Screenings CN Performed Blood Pressure;Blood Glucose;Pulse Ox    Sent Client to Lab for: N/A    Did client attend any of the following based off CNs referral or appointments made? N/A    ED Visit Averted N/A    Life-Saving Intervention Made N/A

## 2022-12-06 NOTE — Telephone Encounter (Signed)
Patient was seen at [facility] 12/06/2022 and 12/07/2022 Nurse wanted to document bright blood in stool for two days , nausea but no vomiting, only after eating . Patient has hx of other GI problems so Nurse Olegario Messier just wanted to make sure that this was documented to discuss at his next visit this week.   Patient has also requested rollator walker - please have Dr Andrey Campanile place the order for DME  to see if he qualifies.    Blood sugar will be check today and Thursday per Nurse Olegario Messier

## 2022-12-08 ENCOUNTER — Telehealth: Payer: Self-pay | Admitting: *Deleted

## 2022-12-08 LAB — BASIC METABOLIC PANEL: Glucose: 108

## 2022-12-08 NOTE — Telephone Encounter (Signed)
[employer] [facility] need addend for patient concerning Rolator walker   Paper work in [employer]

## 2022-12-08 NOTE — Congregational Nurse Program (Signed)
  Dept: [phone]   Congregational Nurse Program Note  Date of Encounter: 12/08/2022  Clinic visit to check blood glucose pc lunch.  States that he had lunch provided at [facility], [employer] and macaroni salad but didn't eat the bread only the meat mixture.  Blood glucose 108.  Encouraged to drink water and not buy soft drinks and eat moderate amount of carbohydrate foods to control blood glucose level. Past Medical History: Past Medical History:  Diagnosis Date   Allergic rhinitis    Anxiety    CHF (congestive heart failure) (HCC)    Depression    Depression    Elevated blood pressure reading without diagnosis of hypertension    History of chicken pox    Obesity    Pleural effusion    Sepsis (HCC)    Sleep apnea    O2 at night    Encounter Details:  CNP Questionnaire - 12/08/22 1320       Questionnaire   Ask client: Do you give verbal consent for me to treat you today? Yes    Student Assistance N/A    Location Patient Served  [facility]    Visit Setting with Client Organization    Patient Status Unhoused    Insurance Medicaid    Insurance/Financial Assistance Referral N/A    Medication Have Medication Insecurities    Medical Provider Yes    Screening Referrals Made N/A    Medical Referrals Made N/A    Medical Appointment Made N/A    Recently w/o PCP, now 1st time PCP visit completed due to CNs referral or appointment made N/A    Food Have Food Insecurities    Transportation Need transportation assistance    Housing/Utilities No permanent housing    Interpersonal Safety N/A    Interventions Advocate/Support;Counsel;Educate    Abnormal to Normal Screening Since Last CN Visit N/A    Screenings CN Performed Blood Glucose    Sent Client to Lab for: N/A    Did client attend any of the following based off CNs referral or appointments made? N/A    ED Visit Averted N/A    Life-Saving Intervention Made N/A

## 2022-12-08 NOTE — Telephone Encounter (Signed)
Made updates to appointment notes

## 2022-12-09 ENCOUNTER — Encounter: Payer: Self-pay | Admitting: Family Medicine

## 2022-12-09 ENCOUNTER — Ambulatory Visit (INDEPENDENT_AMBULATORY_CARE_PROVIDER_SITE_OTHER): Payer: MEDICAID | Admitting: Family Medicine

## 2022-12-09 VITALS — BP 119/78 | HR 79 | Temp 98.3°F | Resp 20 | Ht 74.0 in | Wt 391.4 lb

## 2022-12-09 DIAGNOSIS — I10 Essential (primary) hypertension: Secondary | ICD-10-CM | POA: Diagnosis not present

## 2022-12-09 DIAGNOSIS — E66813 Obesity, class 3: Secondary | ICD-10-CM

## 2022-12-09 DIAGNOSIS — M25562 Pain in left knee: Secondary | ICD-10-CM

## 2022-12-09 DIAGNOSIS — M25561 Pain in right knee: Secondary | ICD-10-CM

## 2022-12-09 DIAGNOSIS — Z6841 Body Mass Index (BMI) 40.0 and over, adult: Secondary | ICD-10-CM

## 2022-12-09 DIAGNOSIS — K921 Melena: Secondary | ICD-10-CM | POA: Diagnosis not present

## 2022-12-09 DIAGNOSIS — Z5900 Homelessness unspecified: Secondary | ICD-10-CM

## 2022-12-09 DIAGNOSIS — J690 Pneumonitis due to inhalation of food and vomit: Secondary | ICD-10-CM | POA: Diagnosis not present

## 2022-12-09 NOTE — Progress Notes (Signed)
Established Patient Office Visit  Subjective    Patient ID: Christian [name], male    DOB: 1975-06-29  Age: 47 y.o. MRN: [id number]  CC:  Chief Complaint  Patient presents with   Follow-up    Follow up from hospital stay, bright red blood in stool    HPI [name] presents for follow up after developing aspiration pneumonia recently. Patient is improving but also now has small amount of blood in stool. He has a past history of perforated bowel.   Outpatient Encounter Medications as of 12/09/2022  Medication Sig   albuterol (VENTOLIN HFA) 108 (90 Base) MCG/ACT inhaler Inhale 2 puffs into the lungs every 6 (six) hours as needed for wheezing or shortness of breath.   fluticasone (FLONASE) 50 MCG/ACT nasal spray Spray 2 sprays into both nostrils once daily. (Patient taking differently: Place 2 sprays into both nostrils daily as needed for allergies.)   fluticasone-salmeterol (ADVAIR HFA) 115-21 MCG/ACT inhaler Inhale 2 puffs into the lungs 2 (two) times daily.   guaiFENesin (MUCINEX) 600 MG 12 hr tablet Take 2 tablets (1,200 mg total) by mouth 2 (two) times daily as needed.   meloxicam (MOBIC) 15 MG tablet Take 1 tablet (15 mg total) by mouth daily.   Multiple Vitamin (MULTIVITAMIN WITH MINERALS) TABS tablet Take 1 tablet by mouth daily.   sertraline (ZOLOFT) 100 MG tablet Take 1 and 1/2 tablet (150 mg total) by mouth daily.   torsemide (DEMADEX) 20 MG tablet Take 2 tablets (40 mg total) by mouth 2 (two) times daily.   traZODone (DESYREL) 50 MG tablet Take 1 tablet (50 mg total) by mouth at bedtime.   benzonatate (TESSALON) 200 MG capsule Take 1 capsule (200 mg total) by mouth 3 (three) times daily as needed for cough. (Patient not taking: Reported on 12/09/2022)   Facility-Administered Encounter Medications as of 12/09/2022  Medication   cholecalciferol (VITAMIN D3) tablet 1,000 Units    Past Medical History:  Diagnosis Date   Allergic rhinitis    Anxiety    CHF  (congestive heart failure) (HCC)    Depression    Depression    Elevated blood pressure reading without diagnosis of hypertension    History of chicken pox    Obesity    Pleural effusion    Sepsis (HCC)    Sleep apnea    O2 at night    Past Surgical History:  Procedure Laterality Date   COLON SURGERY     perforated bowel   TONSILLECTOMY AND ADENOIDECTOMY  03/01/1983    Family History  Problem Relation Age of Onset   Stroke Mother    Diabetes Mother        pre-diabetes   Heart disease Mother        CHF, pacer   Cancer Mother        lymphoma   Stomach cancer Mother    Clotting disorder Mother    Coronary artery disease Father 77       MI   Hypertension Father    Hyperlipidemia Father    Diabetes Father    Heart attack Father    Migraines Sister    Stroke Paternal Grandmother    Diabetes Paternal Grandmother    Heart disease Paternal Grandfather    Parkinson's disease Paternal Grandfather    Heart attack Paternal Grandfather     Social History   Socioeconomic History   Marital status: Single    Spouse name: Not on file   Number of  children: 0   Years of education: Not on file   Highest education level: 12th grade  Occupational History   Not on file  Tobacco Use   Smoking status: Never   Smokeless tobacco: Never   Tobacco comments:    Living in homeless shelter  Vaping Use   Vaping status: Never Used  Substance and Sexual Activity   Alcohol use: Yes    Alcohol/week: 2.0 standard drinks of alcohol    Types: 2 Cans of beer per week    Comment: 1-2 beers 1-2 days a week   Drug use: Never   Sexual activity: Not Currently  Other Topics Concern   Not on file  Social History Narrative   Caffeine: occasional, lives at a shelter [employer])  Occupation: works at [employer] part timeEdu: some college Diet: some water, some fruits/vegetables, 1-2x/wk red meat, fish 1x/wk   Social Determinants of Health   Financial Resource Strain: High Risk (07/01/2022)    Overall Financial Resource Strain (CARDIA)    Difficulty of Paying Living Expenses: Very hard  Food Insecurity: Food Insecurity Present (11/19/2022)   Hunger Vital Sign    Worried About Running Out of Food in the Last Year: Sometimes true    Ran Out of Food in the Last Year: Sometimes true  Transportation Needs: Unmet Transportation Needs (11/19/2022)   PRAPARE - Transportation    Lack of Transportation (Medical): Yes    Lack of Transportation (Non-Medical): Yes  Physical Activity: Inactive (07/01/2022)   Exercise Vital Sign    Days of Exercise per Week: 0 days    Minutes of Exercise per Session: 20 min  Stress: Stress Concern Present (07/01/2022)   [employer] of Occupational Health - Occupational Stress Questionnaire    Feeling of Stress : To some extent  Social Connections: Moderately Integrated (07/01/2022)   Social Connection and Isolation Panel [NHANES]    Frequency of Communication with Friends and Family: More than three times a week    Frequency of Social Gatherings with Friends and Family: Once a week    Attends Religious Services: More than 4 times per year    Active Member of [employer] or Organizations: Yes    Attends Banker Meetings: 1 to 4 times per year    Marital Status: Never married  Recent Concern: Social Connections - Moderately Isolated (05/11/2022)   Social Connection and Isolation Panel [NHANES]    Frequency of Communication with Friends and Family: More than three times a week    Frequency of Social Gatherings with Friends and Family: Twice a week    Attends Religious Services: More than 4 times per year    Active Member of [employer] or Organizations: No    Attends Banker Meetings: Never    Marital Status: Never married  Intimate Partner Violence: Not At Risk (11/19/2022)   Humiliation, Afraid, Rape, and Kick questionnaire    Fear of Current or Ex-Partner: No    Emotionally Abused: No    Physically Abused: No    Sexually Abused: No     Review of Systems  Gastrointestinal:  Positive for blood in stool. Negative for constipation.  All other systems reviewed and are negative.       Objective    BP 119/78 (BP Location: Right Arm, Patient Position: Sitting) Comment (Cuff Size): ex large  Pulse 79   Temp 98.3 F (36.8 C) (Oral)   Resp 20   Ht 6\' 2"  (1.88 m)   Wt (!) 391  lb 6.4 oz (177.5 kg)   SpO2 94%   BMI 50.25 kg/m   Physical Exam Vitals and nursing note reviewed.  Constitutional:      General: He is not in acute distress.    Appearance: He is obese.  Cardiovascular:     Rate and Rhythm: Normal rate and regular rhythm.  Pulmonary:     Effort: Pulmonary effort is normal. No respiratory distress.     Breath sounds: Normal breath sounds. No wheezing.  Abdominal:     Palpations: Abdomen is soft.     Tenderness: There is no abdominal tenderness.  Musculoskeletal:     Right lower leg: No edema.     Left lower leg: No edema.  Neurological:     General: No focal deficit present.     Mental Status: He is alert and oriented to person, place, and time.  Psychiatric:        Mood and Affect: Mood and affect normal.        Speech: Speech normal.        Behavior: Behavior normal. Behavior is cooperative.         Assessment & Plan:   1. Aspiration pneumonia of both lungs, unspecified aspiration pneumonia type, unspecified part of lung (HCC) Improved/resolved  2. Blood in stool Referral to GI for further eval/mgt - Ambulatory referral to Gastroenterology  3. Essential hypertension Appears stable. continue  4. Class 3 severe obesity due to excess calories with serious comorbidity and body mass index (BMI) of 50.0 to 59.9 in adult (HCC)   5. Homeless   6. Pain in both knees, unspecified chronicity Patient is requesting rollater as he is unable to navigate well on a daily basis 2/2 to bilateral knee pain aggravated by morbid obesity. He is having to walk long distances daily 2/2 social  situation.     No follow-ups on file.   Tommie Raymond, MD

## 2022-12-12 ENCOUNTER — Other Ambulatory Visit (HOSPITAL_COMMUNITY): Payer: Self-pay

## 2022-12-12 ENCOUNTER — Other Ambulatory Visit: Payer: Self-pay

## 2022-12-13 ENCOUNTER — Telehealth: Payer: Self-pay | Admitting: Family Medicine

## 2022-12-13 NOTE — Telephone Encounter (Signed)
Michael from [employer] Fax last week called to report that they are missing narratives from face to face notes. Needs to state the need. what they have does not fit insurance guidelines.  1 of 3 needs to be stated on face to face notes:  -prevents the patient from accomplishing the MRADL  -places the patient at reasonably determined heightened risk of morbidity or mortality secondary to the  attempts to perform the MRADL -Prevents the patient from completing the MRADL within a reasonable time frame   Must include one of these three   Also needs to state that a cane or crutch are insufficient. Needs a rollator.   Best contact: [phone]

## 2022-12-13 NOTE — Telephone Encounter (Signed)
Provider has paper in her folder to look over

## 2022-12-13 NOTE — Telephone Encounter (Signed)
New guidelines has been given to provider in her folder

## 2022-12-13 NOTE — Telephone Encounter (Signed)
I spoke to [employer] and she confirmed that she is working with the patient to secure housing. She is considering referring him to the TCL program and said she will work on that with him.  She also confirmed that he stated he is working with an attorney to appeal his disability denial.   I informed her that regarding his [employer] , that coverage is for his Medicaid through [facility].  If he needs to change to [facility] , the [facility] DSS caseworker that I spoke to said that he needs to contact his [facility] caseworker to initiate the transfer to [facility].    Lutricia Horsfall has my phone number and I instructed her to contact me with any questions.

## 2022-12-16 ENCOUNTER — Other Ambulatory Visit (HOSPITAL_COMMUNITY): Payer: Self-pay

## 2022-12-16 ENCOUNTER — Other Ambulatory Visit: Payer: Self-pay

## 2022-12-16 ENCOUNTER — Ambulatory Visit (INDEPENDENT_AMBULATORY_CARE_PROVIDER_SITE_OTHER): Payer: MEDICAID | Admitting: Physician Assistant

## 2022-12-16 DIAGNOSIS — F411 Generalized anxiety disorder: Secondary | ICD-10-CM | POA: Diagnosis not present

## 2022-12-16 DIAGNOSIS — F331 Major depressive disorder, recurrent, moderate: Secondary | ICD-10-CM | POA: Diagnosis not present

## 2022-12-16 MED ORDER — TRAZODONE HCL 50 MG PO TABS
50.0000 mg | ORAL_TABLET | Freq: Every day | ORAL | 2 refills | Status: DC
  Filled 2022-12-16: qty 30, 30d supply, fill #0
  Filled 2023-01-13: qty 30, 30d supply, fill #1

## 2022-12-16 MED ORDER — SERTRALINE HCL 100 MG PO TABS
150.0000 mg | ORAL_TABLET | Freq: Every day | ORAL | 2 refills | Status: DC
  Filled 2022-12-16: qty 45, 30d supply, fill #0
  Filled 2023-01-13: qty 45, 30d supply, fill #1

## 2022-12-16 NOTE — Progress Notes (Unsigned)
BH MD/PA/NP OP Progress Note  12/19/2022 7:18 PM Christian [name]  MRN:  [id number]  Chief Complaint:  Chief Complaint  Patient presents with   Follow-up   Medication Refill   HPI:   Christian [name] is a 47 year old, Caucasian male with a past psychiatric history significant for anxiety and major depressive disorder who presents to [facility] for follow-up and medication management.  Patient is currently being managed on the following psychiatric medication:   Sertraline 150 mg daily. Trazodone 50 mg daily  Patient presents to the encounter endorsing some drowsiness in the afternoon.  Patient reports that he is unsure if his drowsiness is due to his use of sertraline.  He does not believe that his drowsiness is due to his use of trazodone because he has not used it in a bit.  He reports that he notices drowsiness more when sitting still or sitting down.  Despite his drowsiness, patient states that his depression has been more manageable.  He does endorse some depression attributed to being in the homeless shelter.  He states that he has anxiety over having to worry about his disability being approved.  While waiting for his disability to be approved, patient reports that he has been attending programs to help improve his character and job skills.  He states that the staff at these programs are extremely helpful.  Patient states that he has also joined a church and states that members are very supportive of his journey.  He does report that he is a little discouraged when he sees people who have not been in the shelter for a long, find housing.  A GAD-7 screen was performed with the patient scoring an 11.  Patient is alert and oriented x 4, calm, cooperative, and fully engaged in conversation during the encounter.  Patient describes his mood as happy and relaxed.  Patient denies suicidal or homicidal ideations.  He further denies auditory or visual  hallucinations and does not appear to be responding to internal/external stimuli.  Patient endorses good sleep and are still on average 7 to 8 hours of sleep per night.  Patient endorses good appetite and eats on average 3 meals per day.  Patient denies alcohol consumption, tobacco use, or illicit drug use.  Visit Diagnosis:    ICD-10-CM   1. MDD (major depressive disorder), recurrent episode, moderate (HCC)  F33.1 traZODone (DESYREL) 50 MG tablet    sertraline (ZOLOFT) 100 MG tablet    2. GAD (generalized anxiety disorder)  F41.1 sertraline (ZOLOFT) 100 MG tablet      Past Psychiatric History:  Patient reports that when he was first hospitalized due to mental health, he was diagnosed with anxiety and depression. Patient states that he was started on antidepressants during that time but never stayed consistent with his medication management   Past Medical History:  Past Medical History:  Diagnosis Date   Allergic rhinitis    Anxiety    CHF (congestive heart failure) (HCC)    Depression    Depression    Elevated blood pressure reading without diagnosis of hypertension    History of chicken pox    Obesity    Pleural effusion    Sepsis (HCC)    Sleep apnea    O2 at night    Past Surgical History:  Procedure Laterality Date   COLON SURGERY     perforated bowel   TONSILLECTOMY AND ADENOIDECTOMY  03/01/1983    Family Psychiatric History:  Mother - patient reports that his mother had cancer when he was young and she was referred to a mental health facility Uncle (paternal) - possible schizophrenia, patient reports that his family suspected that his uncle may have been schizophrenic because he would see things   Family history of suicide: Patient denies, but states that his mother's, who was adopted, cousin attempted suicide.  Patient states that they were not blood related. Family history of homicide: Patient denies Family history of substance abuse: Patient denies  Family  History:  Family History  Problem Relation Age of Onset   Stroke Mother    Diabetes Mother        pre-diabetes   Heart disease Mother        CHF, pacer   Cancer Mother        lymphoma   Stomach cancer Mother    Clotting disorder Mother    Coronary artery disease Father 56       MI   Hypertension Father    Hyperlipidemia Father    Diabetes Father    Heart attack Father    Migraines Sister    Stroke Paternal Grandmother    Diabetes Paternal Grandmother    Heart disease Paternal Grandfather    Parkinson's disease Paternal Grandfather    Heart attack Paternal Grandfather     Social History:  Social History   Socioeconomic History   Marital status: Single    Spouse name: Not on file   Number of children: 0   Years of education: Not on file   Highest education level: 12th grade  Occupational History   Not on file  Tobacco Use   Smoking status: Never   Smokeless tobacco: Never   Tobacco comments:    Living in homeless shelter  Vaping Use   Vaping status: Never Used  Substance and Sexual Activity   Alcohol use: Yes    Alcohol/week: 2.0 standard drinks of alcohol    Types: 2 Cans of beer per week    Comment: 1-2 beers 1-2 days a week   Drug use: Never   Sexual activity: Not Currently  Other Topics Concern   Not on file  Social History Narrative   Caffeine: occasional, lives at a shelter [employer])  Occupation: works at [employer] part timeEdu: some college Diet: some water, some fruits/vegetables, 1-2x/wk red meat, fish 1x/wk   Social Determinants of Health   Financial Resource Strain: High Risk (07/01/2022)   Overall Financial Resource Strain (CARDIA)    Difficulty of Paying Living Expenses: Very hard  Food Insecurity: Food Insecurity Present (11/19/2022)   Hunger Vital Sign    Worried About Running Out of Food in the Last Year: Sometimes true    Ran Out of Food in the Last Year: Sometimes true  Transportation Needs: Unmet Transportation Needs (11/19/2022)    PRAPARE - Transportation    Lack of Transportation (Medical): Yes    Lack of Transportation (Non-Medical): Yes  Physical Activity: Inactive (07/01/2022)   Exercise Vital Sign    Days of Exercise per Week: 0 days    Minutes of Exercise per Session: 20 min  Stress: Stress Concern Present (07/01/2022)   [employer] of Occupational Health - Occupational Stress Questionnaire    Feeling of Stress : To some extent  Social Connections: Moderately Integrated (07/01/2022)   Social Connection and Isolation Panel [NHANES]    Frequency of Communication with Friends and Family: More than three times a week    Frequency of Social  Gatherings with Friends and Family: Once a week    Attends Religious Services: More than 4 times per year    Active Member of [employer] or Organizations: Yes    Attends Banker Meetings: 1 to 4 times per year    Marital Status: Never married  Recent Concern: Social Connections - Moderately Isolated (05/11/2022)   Social Connection and Isolation Panel [NHANES]    Frequency of Communication with Friends and Family: More than three times a week    Frequency of Social Gatherings with Friends and Family: Twice a week    Attends Religious Services: More than 4 times per year    Active Member of [employer] or Organizations: No    Attends Banker Meetings: Never    Marital Status: Never married    Allergies:  Allergies  Allergen Reactions   Shellfish Allergy Nausea Only    Metabolic Disorder Labs: Lab Results  Component Value Date   HGBA1C 5.8 (H) 11/19/2022   MPG 119.76 11/19/2022   MPG 125.5 10/09/2020   No results found for: "PROLACTIN" Lab Results  Component Value Date   CHOL 153 12/24/2020   TRIG 126 12/24/2020   HDL 42 12/24/2020   CHOLHDL 3.6 12/24/2020   LDLCALC 88 12/24/2020   Lab Results  Component Value Date   TSH 2.290 12/24/2020   TSH 2.650 10/09/2020    Therapeutic Level Labs: No results found for: "LITHIUM" No results  found for: "VALPROATE" No results found for: "CBMZ"  Current Medications: Current Outpatient Medications  Medication Sig Dispense Refill   albuterol (VENTOLIN HFA) 108 (90 Base) MCG/ACT inhaler Inhale 2 puffs into the lungs every 6 (six) hours as needed for wheezing or shortness of breath. 8 g 3   benzonatate (TESSALON) 200 MG capsule Take 1 capsule (200 mg total) by mouth 3 (three) times daily as needed for cough. (Patient not taking: Reported on 12/09/2022) 20 capsule 0   fluticasone (FLONASE) 50 MCG/ACT nasal spray Spray 2 sprays into both nostrils once daily. (Patient taking differently: Place 2 sprays into both nostrils daily as needed for allergies.) 16 g 2   fluticasone-salmeterol (ADVAIR HFA) 115-21 MCG/ACT inhaler Inhale 2 puffs into the lungs 2 (two) times daily. 12 g 12   guaiFENesin (MUCINEX) 600 MG 12 hr tablet Take 2 tablets (1,200 mg total) by mouth 2 (two) times daily as needed. 60 tablet 0   meloxicam (MOBIC) 15 MG tablet Take 1 tablet (15 mg total) by mouth daily. 30 tablet 2   Multiple Vitamin (MULTIVITAMIN WITH MINERALS) TABS tablet Take 1 tablet by mouth daily. 30 tablet 0   sertraline (ZOLOFT) 100 MG tablet Take 1.5 tablets (150 mg total) by mouth daily. 45 tablet 2   torsemide (DEMADEX) 20 MG tablet Take 2 tablets (40 mg total) by mouth 2 (two) times daily. 120 tablet 2   traZODone (DESYREL) 50 MG tablet Take 1 tablet (50 mg total) by mouth at bedtime. 30 tablet 2   Current Facility-Administered Medications  Medication Dose Route Frequency Provider Last Rate Last Admin   cholecalciferol (VITAMIN D3) tablet 1,000 Units  1,000 Units Oral Daily Regalado, Belkys A, MD         Musculoskeletal: Strength & Muscle Tone: within normal limits Gait & Station: normal Patient leans: N/A  Psychiatric Specialty Exam: Review of Systems  Psychiatric/Behavioral:  Positive for sleep disturbance. Negative for decreased concentration, dysphoric mood, hallucinations, self-injury and  suicidal ideas. The patient is nervous/anxious. The patient is not hyperactive.  Blood pressure 118/75, pulse 78, temperature 97.9 F (36.6 C), temperature source Oral, height 6\' 2"  (1.88 m), weight (!) 393 lb 3.2 oz (178.4 kg), SpO2 99%.Body mass index is 50.48 kg/m.  General Appearance: Casual  Eye Contact:  Good  Speech:  Clear and Coherent and Normal Rate  Volume:  Normal  Mood:  Anxious and Depressed  Affect:  Appropriate  Thought Process:  Coherent, Goal Directed, and Descriptions of Associations: Intact  Orientation:  Full (Time, Place, and Person)  Thought Content: WDL   Suicidal Thoughts:  No  Homicidal Thoughts:  No  Memory:  Immediate;   Good Recent;   Good Remote;   Fair  Judgement:  Fair  Insight:  Fair  Psychomotor Activity:  Normal  Concentration:  Concentration: Good and Attention Span: Good  Recall:  Good  Fund of Knowledge: Fair  Language: Good  Akathisia:  No  Handed:  Left  AIMS (if indicated): not done  Assets:  Communication Skills Desire for Improvement Housing Social Support  ADL's:  Intact  Cognition: WNL  Sleep:   Patient has a history of sleep apnea. Patient receives on average 5 - 6 hours of sleep per night.   Screenings: AUDIT    Flowsheet Row Office Visit from 07/05/2022 in [facility] at [facility]  Alcohol Use Disorder Identification Test Final Score (AUDIT) 3      GAD-7    Flowsheet Row Clinical Support from 12/16/2022 in [facility] Office Visit from 12/09/2022 in [facility] Primary Care at [facility] Counselor from 11/29/2022 in [facility] Video Visit from 10/14/2022 in [facility] Office Visit from 08/17/2022 in [facility] Primary Care at [facility]  Total GAD-7 Score 11 5 9 8  0      PHQ2-9    Flowsheet Row Clinical Support from 12/16/2022 in [facility] Office Visit from 12/09/2022 in  [facility] Primary Care at [facility] Counselor from 11/29/2022 in [facility] Office Visit from 11/17/2022 in [facility] Primary Care at [facility] Video Visit from 10/14/2022 in [facility]  PHQ-2 Total Score 1 2 2 3 2   PHQ-9 Total Score -- 5 7 12 8       Flowsheet Row Clinical Support from 12/16/2022 in [facility] ED to Hosp-Admission (Discharged) from 11/18/2022 in [facility] ED from 10/17/2022 in [facility] Emergency Department at [facility]  C-SSRS RISK CATEGORY No Risk No Risk No Risk        Assessment and Plan:   Christian [name] is a 47 year old, Caucasian male with a past psychiatric history significant for anxiety and major depressive disorder who presents to [facility] via virtual video visit for follow-up and medication management.  Patient presents to the encounter stating that he has been experiencing some drowsiness he attributes this to his use of sertraline.  Despite experiencing drowsiness, he reports that this symptom has not been that impactful in his day-to-day life.  He endorses minimal depression but still experiences some sadness attributed to living at the shelter.  While living at the shelter, patient reports that he has been attending workshop classes to help improve his character and job skills.  Patient reports that he has also joined a church and states that [employer] members have been really supportive of him.  Patient would like  to continue taking his medications as prescribed stating that he feels stable on his current medication regimen.  Patient's medications to be e-prescribed to pharmacy of choice.  Collaboration of Care: Collaboration of Care: Medication Management AEB provider managing patient's psychiatric medications, Psychiatrist AEB patient being seen by mental health provider  at this facility, and Referral or follow-up with counselor/therapist AEB patient being seen by a licensed clinical social worker at this facility  Patient/Guardian was advised Release of Information must be obtained prior to any record release in order to collaborate their care with an outside provider. Patient/Guardian was advised if they have not already done so to contact the registration department to sign all necessary forms in order for [place] to release information regarding their care.   Consent: Patient/Guardian gives verbal consent for treatment and assignment of benefits for services provided during this visit. Patient/Guardian expressed understanding and agreed to proceed.   1. MDD (major depressive disorder), recurrent episode, moderate (HCC)  - traZODone (DESYREL) 50 MG tablet; Take 1 tablet (50 mg total) by mouth at bedtime.  Dispense: 30 tablet; Refill: 2 - sertraline (ZOLOFT) 100 MG tablet; Take 1.5 tablets (150 mg total) by mouth daily.  Dispense: 45 tablet; Refill: 2  2. GAD (generalized anxiety disorder)  - sertraline (ZOLOFT) 100 MG tablet; Take 1.5 tablets (150 mg total) by mouth daily.  Dispense: 45 tablet; Refill: 2  Patient to follow-up in 2 months Provider spent a total of 28 minutes with the patient/reviewing patient's chart  Meta Hatchet, PA 12/19/2022, 7:18 PM

## 2022-12-19 ENCOUNTER — Encounter (HOSPITAL_COMMUNITY): Payer: Self-pay | Admitting: Physician Assistant

## 2022-12-22 ENCOUNTER — Encounter: Payer: MEDICAID | Attending: Family Medicine | Admitting: Dietician

## 2022-12-22 DIAGNOSIS — R7303 Prediabetes: Secondary | ICD-10-CM | POA: Diagnosis present

## 2022-12-22 NOTE — Progress Notes (Signed)
Medical Nutrition Therapy  Appointment Start time:  96  Appointment End time:  1135  Primary concerns today: borderline diabetes Referral diagnosis: R73.03 (ICD-10-CM) - Prediabetes  Preferred learning style: no preference indicated Learning readiness: contemplating   NUTRITION ASSESSMENT   Clinical Medical Hx:  Past Medical History:  Diagnosis Date   Allergic rhinitis    Anxiety    CHF (congestive heart failure) (HCC)    Depression    Depression    Elevated blood pressure reading without diagnosis of hypertension    History of chicken pox    Obesity    Pleural effusion    Sepsis (HCC)    Sleep apnea    O2 at night    Medications:  Current Outpatient Medications:    albuterol (VENTOLIN HFA) 108 (90 Base) MCG/ACT inhaler, Inhale 2 puffs into the lungs every 6 (six) hours as needed for wheezing or shortness of breath., Disp: 8 g, Rfl: 3   fluticasone (FLONASE) 50 MCG/ACT nasal spray, Spray 2 sprays into both nostrils once daily. (Patient taking differently: Place 2 sprays into both nostrils daily as needed for allergies.), Disp: 16 g, Rfl: 2   fluticasone-salmeterol (ADVAIR HFA) 115-21 MCG/ACT inhaler, Inhale 2 puffs into the lungs 2 (two) times daily., Disp: 12 g, Rfl: 12   meloxicam (MOBIC) 15 MG tablet, Take 1 tablet (15 mg total) by mouth daily., Disp: 30 tablet, Rfl: 2   Multiple Vitamin (MULTIVITAMIN WITH MINERALS) TABS tablet, Take 1 tablet by mouth daily., Disp: 30 tablet, Rfl: 0   sertraline (ZOLOFT) 100 MG tablet, Take 1.5 tablets (150 mg total) by mouth daily., Disp: 45 tablet, Rfl: 2   torsemide (DEMADEX) 20 MG tablet, Take 2 tablets (40 mg total) by mouth 2 (two) times daily., Disp: 120 tablet, Rfl: 2   traZODone (DESYREL) 50 MG tablet, Take 1 tablet (50 mg total) by mouth at bedtime., Disp: 30 tablet, Rfl: 2   benzonatate (TESSALON) 200 MG capsule, Take 1 capsule (200 mg total) by mouth 3 (three) times daily as needed for cough. (Patient not taking: Reported on  12/09/2022), Disp: 20 capsule, Rfl: 0   guaiFENesin (MUCINEX) 600 MG 12 hr tablet, Take 2 tablets (1,200 mg total) by mouth 2 (two) times daily as needed. (Patient not taking: Reported on 12/22/2022), Disp: 60 tablet, Rfl: 0  Current Facility-Administered Medications:    cholecalciferol (VITAMIN D3) tablet 1,000 Units, 1,000 Units, Oral, Daily, Regalado, Belkys A, MD  Labs:  Lab Results  Component Value Date   HGBA1C 5.8 (H) 11/19/2022   Lab Results  Component Value Date   CHOL 153 12/24/2020   HDL 42 12/24/2020   LDLCALC 88 12/24/2020   TRIG 126 12/24/2020   CHOLHDL 3.6 12/24/2020   BP Readings from Last 3 Encounters:  12/09/22 119/78  12/05/22 117/77  12/01/22 135/83    Lifestyle & Dietary Hx Pt is here today alone with assistive device- cane. Pt is concerned about borderline diabetes. Pt reports 2 weeks ago a nurse mentioned his blood sugar was "120 ish". Pt reports he lives at [employer] and receives meals three times daily. Pt reports he receives SNAP benefits of $ 292 monthly. Pt reports making changes including avoiding bread, purchasing sugar free products. Pt reports he recognized he eats when he is bored.   Estimated daily fluid intake: 84+ oz Supplements: none Sleep: 7-8 hours using CPAP Stress / self-care: 7 out of 10 / therapy and church  Current average weekly physical activity: walking 10 minutes daily  24-Hr Dietary Recall First Meal: eggs, grits, biscuit with SF jelly and butter, [place] sausage, coffee with stevia SF creamer, juice, whole milk or cinnamon toast crunch cereal and coco or fruits loop or apple cks puffs, 2 whole milk,  Snack: sandwich or sub or salad from [employer]  Second Meal: pulled BBQ chicken, potato salad, chicken roll, water Snack: green beans, rice, chicken or no snack Third Meal: pineapple, salad, noodles, tomatoes, ground beef, water with crystal light or  Snack: 2-3 mini pies or chips Beverages: coffee with stevia SF creamer, juice,  water, water with flavor pack, milk, diet soda   NUTRITION DIAGNOSIS  NB-1.1 Food and nutrition-related knowledge deficit As related to mo prior nutrition related education .  As evidenced by Pt reports and 24 hour dietary recall.   NUTRITION INTERVENTION  Nutrition education (E-1) on the following topics:  Fruits & Vegetables: Aim to fill half your plate with a variety of fruits and vegetables. They are rich in vitamins, minerals, and fiber, and can help reduce the risk of chronic diseases. Choose a colorful assortment of fruits and vegetables to ensure you get a wide range of nutrients. Grains and Starches: Make at least half of your grain choices whole grains, such as brown rice, whole wheat bread, and oats. Whole grains provide fiber, which aids in digestion and healthy cholesterol levels. Aim for whole forms of starchy vegetables such as potatoes, sweet potatoes, beans, peas, and corn, which are fiber rich and provide many vitamins and minerals.  Protein: Incorporate lean sources of protein, such as poultry, fish, beans, nuts, and seeds, into your meals. Protein is essential for building and repairing tissues, staying full, balancing blood sugar, as well as supporting immune function. Dairy: Include low-fat or fat-free dairy products like milk, yogurt, and cheese in your diet. Dairy foods are excellent sources of calcium and vitamin D, which are crucial for bone health.  Physical Activity: Aim for 60 minutes of physical activity daily. Regular physical activity promotes overall health-including helping to reduce risk for heart disease and diabetes, promoting mental health, and helping [place] sleep better.   Handouts Provided Include  Plate Planner Prediabetes Information Low Carb Snack List   Learning Style & Readiness for Change Teaching method utilized: Visual & Auditory  Demonstrated degree of understanding via: Teach Back  Barriers to learning/adherence to lifestyle change: housing,  transportation, financial  Goals Established by Pt 1- Try the plate planner 1/2 non starchy vegetable, 1/4 protein, 1/4 starchy foods   MONITORING & EVALUATION Dietary intake, weekly physical activity  Next Steps  Patient is to return PRN.

## 2022-12-22 NOTE — Patient Instructions (Addendum)
1- Try the plate planner 1/2 non starchy vegetable, 1/4 protein, 1/4 starchy foods

## 2022-12-28 ENCOUNTER — Ambulatory Visit (INDEPENDENT_AMBULATORY_CARE_PROVIDER_SITE_OTHER): Payer: MEDICAID | Admitting: Licensed Clinical Social Worker

## 2022-12-28 DIAGNOSIS — F331 Major depressive disorder, recurrent, moderate: Secondary | ICD-10-CM | POA: Diagnosis not present

## 2022-12-28 DIAGNOSIS — F411 Generalized anxiety disorder: Secondary | ICD-10-CM

## 2022-12-28 NOTE — Progress Notes (Signed)
THERAPIST PROGRESS NOTE  Session Time: 30   Participation Level: Active  Behavioral Response: CasualAlertAnxious and Depressed  Type of Therapy: Individual Therapy  Treatment Goals addressed:  Active     Anxiety     LTG: Christian [name] will score less than 5 on the Generalized Anxiety Disorder 7 Scale (GAD-7)  (Completed/Met)     Start:  05/11/22    Expected End:  10/28/22    Resolved:  08/17/22    Goal Note     Scpre taken today and last session scored below a 5 each time          STG: Christian [name] will complete at least 80% of assigned homework  (Progressing)     Start:  05/11/22    Expected End:  03/31/23         STG: Christian [name] will practice problem solving skills 3 times per week for the next 4 weeks.  (Progressing)     Start:  05/11/22    Expected End:  03/31/23         identify 3 trigger for anxiety  (Progressing)     Start:  05/11/22    Expected End:  03/31/23         create  3 coping skills for depression  (Progressing)     Start:  05/11/22    Expected End:  03/31/23         Discuss risks and benefits of medication treatment options for this problem and prescribe as indicated     Start:  05/11/22         Encourage Christian [name] to take psychotropic medication(s) as prescribed     Start:  05/11/22         Work with Christian [name] to identify a minimum of 3 alternative coping behaviors to avoidance.      Start:  05/11/22         Create a weekly activity schedule     Start:  05/11/22         Review results of GAD-7 with Christian [name] to track progress (Completed)     Start:  05/11/22    End:  06/28/22    Intervention Note     Reviewed with patient during session.          Work with Christian [name] to track symptoms, triggers, and/or skill use through a mood chart, diary card, or journal (Completed)     Start:  05/11/22    End:  06/07/22      Perform psychoeducation regarding anxiety disorders (Completed)     Start:  05/11/22    End:  06/07/22      Provide Christian [name] with  educational information and reading material on anxiety, its causes, and symptoms.  (Completed)     Start:  05/11/22    End:  06/07/22      Work with patient individually to identify the major components of a recent episode of anxiety: physical symptoms, major thoughts and images, and major behaviors they experienced (Completed)     Start:  05/11/22    End:  06/07/22      Work with Christian [name] to identify 3 personal goals for managing their anxiety to work on during current treatment.  (Completed)     Start:  05/11/22    End:  06/28/22      Work with Christian [name] to identify a minimum of 3 consequences of avoidance.  (Completed)     Start:  05/11/22    End:  06/28/22  OP Depression     LTG: Reduce frequency, intensity, and duration of depression symptoms so that daily functioning is improved (Progressing)     Start:  05/11/22    Expected End:  03/31/23         LTG: Increase coping skills to manage depression and improve ability to perform daily activities (Progressing)     Start:  05/11/22    Expected End:  03/31/23         LTG: Christian [name] will score less than 10 on the Patient Health Questionnaire (PHQ-9)  (Completed/Met)     Start:  05/11/22    Expected End:  10/28/22    Resolved:  08/17/22    Goal Note     Scpre taken today and last session scored below a 5 each time          Work with Christian [name] to track symptoms, triggers, and/or skill use through a mood chart, diary card, or journal (Completed)     Start:  05/11/22    End:  06/07/22      Therapist will administer the PHQ-9 at weekly intervals for the next 8 weeks (Completed)     Start:  05/11/22    End:  06/28/22      Work with Christian [name] to identify the major components of a recent episode of depression: physical symptoms, major thoughts and images, and major behaviors they experienced (Completed)     Start:  05/11/22    End:  06/07/22         ProgressTowards Goals: Progressing  Interventions: CBT and Motivational  Interviewing  Summary: Christian [name] is a 47 y.o. male who presents with anxious mood\affect.  Patient was pleasant, cooperative, maintained good eye contact.  He engaged well in therapy session was dressed casually.  Patient reports that things have been going overall well for him.  He reports that he is doing well at his shelter.  Patient reports he is engaging in Bible study and work related programs to help find housing for him.  Patient does report some boundaries being crossed at the shelter by a resident that also lives there.  Patient reports that she is looking after him but will turn away dessert for him even though he does not feel it is necessary.  This is due to the fact that he is prediabetic.  Patient reports that he has had verbal disputes with this person because he feels that she is "out of alignment".  Suicidal/Homicidal: Nowithout intent/plan  Therapist Response:   Intervention\plan: LCSW provided patient coping skills for anxiety and anger.  LCSW provided patient worksheet for "tips for setting healthy boundaries".  LCSW utilized supportive therapy for praise and encouragement.  LCSW utilized psycho analytic therapy for patient to express thoughts, feelings and emotions and nonjudgmental environment.    Plan: Return again in 4 weeks.  Diagnosis: No diagnosis found.  Collaboration of Care: Other None today   Patient/Guardian was advised Release of Information must be obtained prior to any record release in order to collaborate their care with an outside provider. Patient/Guardian was advised if they have not already done so to contact the registration department to sign all necessary forms in order for [place] to release information regarding their care.   Consent: Patient/Guardian gives verbal consent for treatment and assignment of benefits for services provided during this visit. Patient/Guardian expressed understanding and agreed to proceed.   Weber Cooks,  LCSW 12/28/2022

## 2022-12-29 DIAGNOSIS — R7301 Impaired fasting glucose: Secondary | ICD-10-CM

## 2022-12-29 LAB — GLUCOSE, POCT (MANUAL RESULT ENTRY): POC Glucose: 98 mg/dL (ref 70–99)

## 2022-12-29 NOTE — Congregational Nurse Program (Signed)
  Dept: [phone]   Congregational Nurse Program Note  Date of Encounter: 12/29/2022  Clinic visit to check blood glucose and weight, glucose 98 before lunch, weight 385 Lbs.  Educated regarding weight loss necessary to help decrease A1C from current pre-diabetes range.  Discussed use of Plate method to make healthier food choices. Past Medical History: Past Medical History:  Diagnosis Date   Allergic rhinitis    Anxiety    CHF (congestive heart failure) (HCC)    Depression    Depression    Elevated blood pressure reading without diagnosis of hypertension    History of chicken pox    Obesity    Pleural effusion    Sepsis (HCC)    Sleep apnea    O2 at night    Encounter Details:  Community Questionnaire - 12/29/22 1040       Questionnaire   Ask client: Do you give verbal consent for me to treat you today? Yes    Location Patient Served  [facility]    Encounter Setting CN site    Population Status Unhoused    Insurance Medicaid    Insurance/Financial Assistance Referral N/A    Medication Have Medication Insecurities    Medical Provider Yes    Screening Referrals Made N/A    Medical Referrals Made N/A    Medical Appointment Completed N/A    CNP Interventions Counsel;Educate;Advocate/Support    Screenings CN Performed Blood Pressure;Blood Glucose;Weight    ED Visit Averted N/A    Life-Saving Intervention Made N/A

## 2023-01-02 ENCOUNTER — Ambulatory Visit (INDEPENDENT_AMBULATORY_CARE_PROVIDER_SITE_OTHER): Payer: MEDICAID

## 2023-01-02 ENCOUNTER — Ambulatory Visit (INDEPENDENT_AMBULATORY_CARE_PROVIDER_SITE_OTHER): Payer: MEDICAID | Admitting: Adult Health

## 2023-01-02 ENCOUNTER — Encounter: Payer: Self-pay | Admitting: Adult Health

## 2023-01-02 VITALS — BP 120/70 | HR 85 | Temp 97.6°F | Ht 74.0 in | Wt 396.8 lb

## 2023-01-02 DIAGNOSIS — I5032 Chronic diastolic (congestive) heart failure: Secondary | ICD-10-CM

## 2023-01-02 DIAGNOSIS — J189 Pneumonia, unspecified organism: Secondary | ICD-10-CM

## 2023-01-02 DIAGNOSIS — G4733 Obstructive sleep apnea (adult) (pediatric): Secondary | ICD-10-CM | POA: Diagnosis not present

## 2023-01-02 DIAGNOSIS — J4530 Mild persistent asthma, uncomplicated: Secondary | ICD-10-CM | POA: Diagnosis not present

## 2023-01-02 DIAGNOSIS — J690 Pneumonitis due to inhalation of food and vomit: Secondary | ICD-10-CM

## 2023-01-02 NOTE — Patient Instructions (Signed)
Keep up the good work Continue on CPAP at bedtime.  Work on healthy weight loss Do not drive if sleepy Activity as tolerated.  Set up CT chest in 4 weeks .  Continue on Advair 115 2 puff Twicedaily, rinse after use   Albuterol inhaler 1-2 puffs every 4-6hr as needed .  Chest xray today  Follow up with in  2-3 months with Dr. Wynona Neat or Lafaye Mcelmurry NP and As needed  (30 min slot)

## 2023-01-02 NOTE — Progress Notes (Unsigned)
@Patient  ID: Christian [name], male    DOB: 09-18-1975, 47 y.o.   MRN: [id number]  Chief Complaint  Patient presents with   Follow-up    Referring provider: [name], MD  HPI: 47 year old male never smoker seen for sleep consult March 18, 2022 to establish for sleep apnea and chronic respiratory failure on nocturnal oxygen and recurrent pneumonia Medical history significant for diastolic heart failure, recurrent hospitalization in 2022 with perforated bowel status post resection complicated by acute respiratory failure, influenza, pneumonia sepsis, acute decubitus ulcer discharged on oxygen felt to have underlying sleep apnea/OHS, morbid obesity with BMI of 49  TEST/EVENTS :  NPSG June 14, 2021 showed moderate sleep apnea with AHI at 25.2/hour, AHI during REM was 82/hour, SpO2 low 71%  CPAP titration study March 22, 2022 shows optimal control on CPAP Rec: Trial of CPAP therapy on 12 cm H2O with O2 2l/m  - Patient used a Large size [employer] Full Face Evora Full mask and heated humidification. - Supplemental O2 2L. Adjust as appropriate.  Imaging  COVID-19 infection in November 2023. CT chest April 29, 2022 shows significant improvement with resolution of pulmonary infiltrates. There was some residual clusters of groundglass and scattered nodules measuring up to 4 mm.   Admission 05/2022 [facility] -LLL (echo showed preserved EF. Grade 1 diastolic dysfunction , CT chest showed infiltrate in the left lower lobe, lingula and right middle lobe. Scarring in the right upper and right middle lobe. )   PFTs that were done on August 29, 2022 that showed mild to moderate airflow obstruction and mild restriction. FEV1 65%, ratio 68, FVC 75%, 10% bronchodilator change with some reversibility. DLCO 130%. >>>started on Advair   Admission 10/2022 -Dx Aspiration PNA - Multifocal PNA bilaterally  On CT chest   01/02/2023 Follow up : O[facility] Patient returns for a 67-month follow-up.  Patient has  underlying sleep apnea and OHS.  BMI is 49.  He is on CPAP at bedtime.  He is supposed to be on CPAP with oxygen but has been unable to use his oxygen due to housing situation.  Patient lives in homeless shelter. Says he is benefiting from CPAP with decreased daytime sleepiness.  CPAP download shows excellent compliance with 100% usage.  Daily average usage at 8.5 hours.  Patient is on CPAP 12 cm H2O.  AHI is 1.4/hour. Full face mack. Can not use Oxygen at shelter.   Patient has a history of recurrent pneumonia.  He has severe pneumonia with prolonged hospitalization in 2022.  He also had COVID-19 infection in November 2023.  CT chest April 29, 2022 showed improvement with resolution of pulmonary infiltrates.  He was admitted again in April 2024 with a left lower lobe pneumonia.  He was treated with empiric antibiotics.  Patient was readmitted in September 2024 after getting choked on a piece of chicken.  He did require the Heimlich maneuver.  He presented in acute respiratory distress with hypoxemia.  CT chest showed severe multifocal infiltrates bilaterally.  With mediastinal and right hilar lymphadenopathy.  Patient was treated with aggressive IV antibiotics.  Patient was seen by speech therapy and was found to eat and drink very rapidly felt due to his social circumstances with living in a homeless shelter and food instability.  Patient was recommended to slow down and eat more carefully. Does tell me he did have Covid few weeks before this last admission. He is feeling better since discharge.   Has Asthma , started on Advair last visit.  Feels it is helping his breathing some. Discussed adding a spacer.      Allergies  Allergen Reactions   Shellfish Allergy Nausea Only    Immunization History  Administered Date(s) Administered   Influenza Split 11/30/2011   Influenza, Seasonal, Injecte, Preservative Fre 11/17/2022   Influenza,inj,Quad PF,6+ Mos 11/07/2020    Past Medical History:  Diagnosis  Date   Allergic rhinitis    Anxiety    CHF (congestive heart failure) (HCC)    Depression    Depression    Elevated blood pressure reading without diagnosis of hypertension    History of chicken pox    Obesity    Pleural effusion    Sepsis (HCC)    Sleep apnea    O2 at night    Tobacco History: Social History   Tobacco Use  Smoking Status Never  Smokeless Tobacco Never  Tobacco Comments   Living in homeless shelter   Counseling given: Not Answered Tobacco comments: Living in homeless shelter   Outpatient Medications Prior to Visit  Medication Sig Dispense Refill   albuterol (VENTOLIN HFA) 108 (90 Base) MCG/ACT inhaler Inhale 2 puffs into the lungs every 6 (six) hours as needed for wheezing or shortness of breath. 8 g 3   fluticasone (FLONASE) 50 MCG/ACT nasal spray Spray 2 sprays into both nostrils once daily. (Patient taking differently: Place 2 sprays into both nostrils daily as needed for allergies.) 16 g 2   fluticasone-salmeterol (ADVAIR HFA) 115-21 MCG/ACT inhaler Inhale 2 puffs into the lungs 2 (two) times daily. 12 g 12   meloxicam (MOBIC) 15 MG tablet Take 1 tablet (15 mg total) by mouth daily. 30 tablet 2   sertraline (ZOLOFT) 100 MG tablet Take 1.5 tablets (150 mg total) by mouth daily. 45 tablet 2   torsemide (DEMADEX) 20 MG tablet Take 2 tablets (40 mg total) by mouth 2 (two) times daily. 120 tablet 2   traZODone (DESYREL) 50 MG tablet Take 1 tablet (50 mg total) by mouth at bedtime. 30 tablet 2   benzonatate (TESSALON) 200 MG capsule Take 1 capsule (200 mg total) by mouth 3 (three) times daily as needed for cough. (Patient not taking: Reported on 12/09/2022) 20 capsule 0   guaiFENesin (MUCINEX) 600 MG 12 hr tablet Take 2 tablets (1,200 mg total) by mouth 2 (two) times daily as needed. (Patient not taking: Reported on 12/22/2022) 60 tablet 0   Multiple Vitamin (MULTIVITAMIN WITH MINERALS) TABS tablet Take 1 tablet by mouth daily. 30 tablet 0    Facility-Administered Medications Prior to Visit  Medication Dose Route Frequency Provider Last Rate Last Admin   cholecalciferol (VITAMIN D3) tablet 1,000 Units  1,000 Units Oral Daily Regalado, Belkys A, MD         Review of Systems:   Constitutional:   No  weight loss, night sweats,  Fevers, chills, fatigue, or  lassitude.  HEENT:   No headaches,  Difficulty swallowing,  Tooth/dental problems, or  Sore throat,                No sneezing, itching, ear ache, nasal congestion, post nasal drip,   CV:  No chest pain,  Orthopnea, PND, swelling in lower extremities, anasarca, dizziness, palpitations, syncope.   GI  No heartburn, indigestion, abdominal pain, nausea, vomiting, diarrhea, change in bowel habits, loss of appetite, bloody stools.   Resp: No shortness of breath with exertion or at rest.  No excess mucus, no productive cough,  No non-productive cough,  No coughing  up of blood.  No change in color of mucus.  No wheezing.  No chest wall deformity  Skin: no rash or lesions.  GU: no dysuria, change in color of urine, no urgency or frequency.  No flank pain, no hematuria   MS:  No joint pain or swelling.  No decreased [name] of motion.  No back pain.    Physical Exam  There were no vitals taken for this visit.  GEN: A/Ox3; pleasant , NAD, well nourished    HEENT:  [place]/AT,  EACs-clear, TMs-wnl, NOSE-clear, THROAT-clear, no lesions, no postnasal drip or exudate noted.   NECK:  Supple w/ fair ROM; no JVD; normal carotid impulses w/o bruits; no thyromegaly or nodules palpated; no lymphadenopathy.    RESP  Clear  P & A; w/o, wheezes/ rales/ or rhonchi. no accessory muscle use, no dullness to percussion  CARD:  RRR, no m/r/g, no peripheral edema, pulses intact, no cyanosis or clubbing.  GI:   Soft & nt; nml bowel sounds; no organomegaly or masses detected.   Musco: Warm bil, no deformities or joint swelling noted.   Neuro: alert, no focal deficits noted.    Skin: Warm, no  lesions or rashes    Lab Results:  CBC    Component Value Date/Time   WBC 7.7 11/27/2022 0657   RBC 4.96 11/27/2022 0657   HGB 12.9 (L) 11/27/2022 0657   HGB 14.4 03/26/2021 0903   HCT 41.3 11/27/2022 0657   HCT 44.1 03/26/2021 0903   PLT 354 11/27/2022 0657   PLT 354 03/26/2021 0903   MCV 83.3 11/27/2022 0657   MCV 78 (L) 03/26/2021 0903   MCH 26.0 11/27/2022 0657   MCHC 31.2 11/27/2022 0657   RDW 13.9 11/27/2022 0657   RDW 14.7 03/26/2021 0903   LYMPHSABS 1.7 11/27/2022 0657   LYMPHSABS 1.7 03/26/2021 0903   MONOABS 0.8 11/27/2022 0657   EOSABS 0.3 11/27/2022 0657   EOSABS 0.4 03/26/2021 0903   BASOSABS 0.1 11/27/2022 0657   BASOSABS 0.1 03/26/2021 0903    BMET    Component Value Date/Time   NA 138 11/28/2022 0453   NA 145 (H) 08/01/2022 0905   K 3.1 (L) 11/28/2022 0453   CL 100 11/28/2022 0453   CO2 28 11/28/2022 0453   GLUCOSE 96 11/28/2022 0453   BUN 24 (H) 11/28/2022 0453   BUN 18 08/01/2022 0905   CREATININE 0.92 11/28/2022 0453   CALCIUM 8.4 (L) 11/28/2022 0453   GFRNONAA >60 11/28/2022 0453   GFRAA >60 09/23/2019 1700    BNP    Component Value Date/Time   BNP 67.9 11/19/2022 0322    ProBNP No results found for: "PROBNP"  Imaging: No results found.  Administration History     None          Latest Ref Rng & Units 08/29/2022    2:48 PM  PFT Results  FVC-Pre L 4.13   FVC-Predicted Pre % 75   FVC-Post L 4.25   FVC-Predicted Post % 77   Pre FEV1/FVC % % 68   Post FEV1/FCV % % 73   FEV1-Pre L 2.80   FEV1-Predicted Pre % 65   FEV1-Post L 3.09   DLCO uncorrected ml/min/mmHg 41.07   DLCO UNC% % 130   DLCO corrected ml/min/mmHg 42.59   DLCO COR %Predicted % 134   DLVA Predicted % 139   TLC L 8.04   TLC % Predicted % 109   RV % Predicted % 180  No results found for: "NITRICOXIDE"      Assessment & Plan:   No problem-specific Assessment & Plan notes found for this encounter.     Rubye Oaks, NP 01/02/2023

## 2023-01-03 NOTE — Assessment & Plan Note (Signed)
Appears euvolemic on exam.  Continue current regimen 

## 2023-01-03 NOTE — Assessment & Plan Note (Signed)
Improved symptom control on Advair .   Plan  Patient Instructions  Keep up the good work Continue on CPAP at bedtime.  Work on healthy weight loss Do not drive if sleepy Activity as tolerated.  Set up CT chest in 4 weeks .  Continue on Advair 115 2 puff Twicedaily, rinse after use   Albuterol inhaler 1-2 puffs every 4-6hr as needed .  Chest xray today  Follow up with in  2-3 months with Dr. Wynona Neat or Brysun Eschmann NP and As needed

## 2023-01-03 NOTE — Assessment & Plan Note (Signed)
Recent admission with aspiration pneumonia after choking piece of chicken. Clinically improved . Prone to recurrent pneumonia  Check chest xray today , and CT chest ~4 weeks for clearance  Aspiration precautions discussed   Plan  Patient Instructions  Keep up the good work Continue on CPAP at bedtime.  Work on healthy weight loss Do not drive if sleepy Activity as tolerated.  Set up CT chest in 4 weeks .  Continue on Advair 115 2 puff Twicedaily, rinse after use   Albuterol inhaler 1-2 puffs every 4-6hr as needed .  Chest xray today  Follow up with in  2-3 months with Dr. Wynona Neat or Achille Xiang NP and As needed

## 2023-01-03 NOTE — Assessment & Plan Note (Signed)
OSA/OHS excellent control and compliance on CPAP - Unable to use O2 with CPAP due to housing . Homeless shelter does not allow   Plan  Patient Instructions  Keep up the good work Continue on CPAP at bedtime.  Work on healthy weight loss Do not drive if sleepy Activity as tolerated.  Set up CT chest in 4 weeks .  Continue on Advair 115 2 puff Twicedaily, rinse after use   Albuterol inhaler 1-2 puffs every 4-6hr as needed .  Chest xray today  Follow up with in  2-3 months with Dr. Wynona Neat or Jaquelyne Firkus NP and As needed

## 2023-01-10 ENCOUNTER — Telehealth (HOSPITAL_COMMUNITY): Payer: Self-pay | Admitting: Licensed Clinical Social Worker

## 2023-01-10 NOTE — Telephone Encounter (Signed)
LCSW called and left voicemail for patient updating him on change of appointment that needed to be rescheduled on 12/18.  Appointment rescheduled to 12/24 at 10 AM virtual.  LCSW advised patient to call [phone] with questions.

## 2023-01-11 ENCOUNTER — Other Ambulatory Visit (HOSPITAL_COMMUNITY)
Admission: RE | Admit: 2023-01-11 | Discharge: 2023-01-11 | Disposition: A | Discharge status: Home / Self Care | Payer: MEDICAID | Source: Ambulatory Visit | Attending: Oncology | Admitting: Oncology

## 2023-01-11 ENCOUNTER — Other Ambulatory Visit: Payer: Self-pay

## 2023-01-11 DIAGNOSIS — Z006 Encounter for examination for normal comparison and control in clinical research program: Secondary | ICD-10-CM | POA: Insufficient documentation

## 2023-01-13 ENCOUNTER — Other Ambulatory Visit (HOSPITAL_COMMUNITY): Payer: Self-pay

## 2023-01-21 LAB — HELIX MOLECULAR SCREEN: Genetic Analysis Overall Interpretation: NEGATIVE

## 2023-01-21 LAB — GENECONNECT MOLECULAR SCREEN

## 2023-01-23 ENCOUNTER — Encounter: Payer: Self-pay | Admitting: *Deleted

## 2023-01-23 NOTE — Congregational Nurse Program (Signed)
  Dept: [phone]   Congregational Nurse Program Note  Date of Encounter: 01/23/2023  Past Medical History: Past Medical History:  Diagnosis Date   Allergic rhinitis    Anxiety    CHF (congestive heart failure) (HCC)    Depression    Depression    Elevated blood pressure reading without diagnosis of hypertension    History of chicken pox    Obesity    Pleural effusion    Sepsis (HCC)    Sleep apnea    O2 at night    Encounter Details:  Community Questionnaire - 01/23/23 1130       Questionnaire   Ask client: Do you give verbal consent for me to treat you today? Yes    Student Assistance N/A    Location Patient Served  [facility]    Encounter Setting CN site    Population Status Unhoused    Insurance Medicaid    Insurance/Financial Assistance Referral N/A    Medication N/A    Medical Provider Yes    Screening Referrals Made N/A    Medical Referrals Made N/A    Medical Appointment Completed N/A    CNP Interventions Advocate/Support;Counsel;Educate    Screenings CN Performed Blood Pressure    ED Visit Averted N/A    Life-Saving Intervention Made N/A           Client came to nurse's office for blood pressure check, asking for adult diapers and distilled water for his cpap machine. Checked vitals. Blood pressure 122/78, pulse 67, SpO2 94%. Writer did not check CBG as client had recently eat a cookie. Gave adult diapers and distilled water as requested. Discussed talking with PCP on next visit about getting a request to medicaid for adult diapers as they may be covered by medicaid. Educated client on food choices to assist with decreasing sugar intake. Offered support and encouragement. Margan Elias W RN CN

## 2023-01-24 ENCOUNTER — Ambulatory Visit (INDEPENDENT_AMBULATORY_CARE_PROVIDER_SITE_OTHER): Payer: MEDICAID | Admitting: Licensed Clinical Social Worker

## 2023-01-24 DIAGNOSIS — F331 Major depressive disorder, recurrent, moderate: Secondary | ICD-10-CM

## 2023-01-24 DIAGNOSIS — F411 Generalized anxiety disorder: Secondary | ICD-10-CM

## 2023-01-24 NOTE — Progress Notes (Signed)
THERAPIST PROGRESS NOTE  Session Time: 64  Participation Level: Active  Behavioral Response: CasualAlertAnxious and Depressed  Type of Therapy: Individual Therapy  Treatment Goals addressed:  Active     Anxiety     LTG: Christian [name] will score less than 5 on the Generalized Anxiety Disorder 7 Scale (GAD-7)  (Completed/Met)     Start:  05/11/22    Expected End:  10/28/22    Resolved:  08/17/22    Goal Note     Scpre taken today and last session scored below a 5 each time          STG: Christian [name] will complete at least 80% of assigned homework  (Progressing)     Start:  05/11/22    Expected End:  03/31/23         STG: Christian [name] will practice problem solving skills 3 times per week for the next 4 weeks.  (Progressing)     Start:  05/11/22    Expected End:  03/31/23         identify 3 trigger for anxiety  (Progressing)     Start:  05/11/22    Expected End:  03/31/23       Goal Note     Housing          create  3 coping skills for depression  (Progressing)     Start:  05/11/22    Expected End:  03/31/23         Discuss risks and benefits of medication treatment options for this problem and prescribe as indicated     Start:  05/11/22         Encourage Christian [name] to take psychotropic medication(s) as prescribed     Start:  05/11/22         Work with Christian [name] to identify a minimum of 3 alternative coping behaviors to avoidance.      Start:  05/11/22         Create a weekly activity schedule     Start:  05/11/22         Review results of GAD-7 with Christian [name] to track progress (Completed)     Start:  05/11/22    End:  06/28/22    Intervention Note     Reviewed with patient during session.          Work with Christian [name] to track symptoms, triggers, and/or skill use through a mood chart, diary card, or journal (Completed)     Start:  05/11/22    End:  06/07/22      Perform psychoeducation regarding anxiety disorders (Completed)     Start:  05/11/22    End:   06/07/22      Provide Christian [name] with educational information and reading material on anxiety, its causes, and symptoms.  (Completed)     Start:  05/11/22    End:  06/07/22      Work with patient individually to identify the major components of a recent episode of anxiety: physical symptoms, major thoughts and images, and major behaviors they experienced (Completed)     Start:  05/11/22    End:  06/07/22      Work with Christian [name] to identify 3 personal goals for managing their anxiety to work on during current treatment.  (Completed)     Start:  05/11/22    End:  06/28/22      Work with Christian [name] to identify a minimum of 3 consequences of avoidance.  (Completed)  Start:  05/11/22    End:  06/28/22        OP Depression     LTG: Reduce frequency, intensity, and duration of depression symptoms so that daily functioning is improved (Progressing)     Start:  05/11/22    Expected End:  03/31/23         LTG: Increase coping skills to manage depression and improve ability to perform daily activities (Progressing)     Start:  05/11/22    Expected End:  03/31/23         LTG: Christian [name] will score less than 10 on the Patient Health Questionnaire (PHQ-9)  (Completed/Met)     Start:  05/11/22    Expected End:  10/28/22    Resolved:  08/17/22    Goal Note     Scpre taken today and last session scored below a 5 each time          Work with Christian [name] to track symptoms, triggers, and/or skill use through a mood chart, diary card, or journal (Completed)     Start:  05/11/22    End:  06/07/22      Therapist will administer the PHQ-9 at weekly intervals for the next 8 weeks (Completed)     Start:  05/11/22    End:  06/28/22      Work with Christian [name] to identify the major components of a recent episode of depression: physical symptoms, major thoughts and images, and major behaviors they experienced (Completed)     Start:  05/11/22    End:  06/07/22         ProgressTowards Goals:  Progressing  Interventions: CBT, Motivational Interviewing, and Supportive  Summary: Christian [name] is a 47 y.o. male who presents with depressed and anxious mood\affect.  Patient was pleasant, cooperative, maintained good eye contact.  He engaged well in therapy session was dressed casually.  Christian [name] was alert and oriented x 5.  Patient comes in today with primary stressors of housing.  Patient reports he has until December 6 to be at his shelter.  Patient reports he is not sure where he is getting go after that.  He reports that he has some resources to winter housing [place] [facility] in [place] but that is all he has so far.  He reports that he is also connected with [employer] for housing resources and a Sports coach is working with him.  Christian [name] reports that his primary barrier is in common.  Patient reports that he is eligible for subsidized housing however until he gets approved for Social Security disability or find employment that is sustainable with his physical and mental limitations he is not eligible for subsidized housing.  Christian [name] also reports depression increased due to holiday season.  Patient reports that he has no family in the area.  He reports that this is a struggle around this time of the year.  Patient endorses symptoms for worthlessness, hopelessness, restlessness, tension, and worry.  Christian [name] also endorses symptoms for irritability as evidenced by getting into verbal altercations with members of the shelter.    Suicidal/Homicidal: Nowithout intent/plan  Therapist Response:    Intervention/plan LCSW psycho analytic therapy for patient to express thoughts, feelings and emotions and nonjudgmental environment.  LCSW supportive therapy for praise and encouragement.  LCSW educated patient on setting healthy boundaries for knowing limits and understanding values.  LCSW educated patient on coping techniques for decreasing anxiety and depression such as deep breathing and progressive muscle  relaxation.   Plan:  Return again in 4 weeks.  Diagnosis: No diagnosis found.  Collaboration of Care: Other None today   Patient/Guardian was advised Release of Information must be obtained prior to any record release in order to collaborate their care with an outside provider. Patient/Guardian was advised if they have not already done so to contact the registration department to sign all necessary forms in order for [place] to release information regarding their care.   Consent: Patient/Guardian gives verbal consent for treatment and assignment of benefits for services provided during this visit. Patient/Guardian expressed understanding and agreed to proceed.   Christian Cooks, LCSW 01/24/2023

## 2023-01-30 ENCOUNTER — Ambulatory Visit (HOSPITAL_COMMUNITY)
Admission: RE | Admit: 2023-01-30 | Discharge: 2023-01-30 | Disposition: A | Discharge status: Home / Self Care | Payer: MEDICAID | Source: Ambulatory Visit | Attending: Adult Health | Admitting: Adult Health

## 2023-01-30 ENCOUNTER — Encounter (HOSPITAL_COMMUNITY): Payer: Self-pay

## 2023-01-30 DIAGNOSIS — J189 Pneumonia, unspecified organism: Secondary | ICD-10-CM | POA: Insufficient documentation

## 2023-01-31 DIAGNOSIS — R7303 Prediabetes: Secondary | ICD-10-CM

## 2023-01-31 LAB — GLUCOSE, POCT (MANUAL RESULT ENTRY): POC Glucose: 137 mg/dL — AB (ref 70–99)

## 2023-01-31 NOTE — Congregational Nurse Program (Signed)
  Dept: [phone]   Congregational Nurse Program Note  Date of Encounter: 01/31/2023  Clinic visit to check blood pressure and blood glucose, BP 141/83, pulse 74 and regular, O2 Sat 95%.  Blood glucose 137 ac lunch, had snack after breakfast. Educated regarding managing carbohydrate intake during the holiday season and limiting total intake to continue to lose weight. Past Medical History: Past Medical History:  Diagnosis Date   Allergic rhinitis    Anxiety    CHF (congestive heart failure) (HCC)    Depression    Depression    Elevated blood pressure reading without diagnosis of hypertension    History of chicken pox    Obesity    Pleural effusion    Sepsis (HCC)    Sleep apnea    O2 at night    Encounter Details:  Community Questionnaire - 01/31/23 1025       Questionnaire   Ask client: Do you give verbal consent for me to treat you today? Yes

## 2023-02-11 ENCOUNTER — Other Ambulatory Visit: Payer: Self-pay | Admitting: Family Medicine

## 2023-02-13 ENCOUNTER — Other Ambulatory Visit: Payer: Self-pay

## 2023-02-13 ENCOUNTER — Other Ambulatory Visit (HOSPITAL_COMMUNITY): Payer: Self-pay

## 2023-02-13 MED ORDER — TORSEMIDE 20 MG PO TABS
40.0000 mg | ORAL_TABLET | Freq: Two times a day (BID) | ORAL | 2 refills | Status: DC
  Filled 2023-02-13: qty 120, 30d supply, fill #0
  Filled 2023-03-13: qty 120, 30d supply, fill #1
  Filled 2023-04-12: qty 120, 30d supply, fill #2

## 2023-02-15 ENCOUNTER — Ambulatory Visit (HOSPITAL_COMMUNITY): Payer: MEDICAID | Admitting: Licensed Clinical Social Worker

## 2023-02-16 ENCOUNTER — Ambulatory Visit: Payer: Medicaid Other | Admitting: Family Medicine

## 2023-02-17 ENCOUNTER — Other Ambulatory Visit: Payer: Self-pay

## 2023-02-17 ENCOUNTER — Ambulatory Visit (INDEPENDENT_AMBULATORY_CARE_PROVIDER_SITE_OTHER): Payer: MEDICAID | Admitting: Physician Assistant

## 2023-02-17 ENCOUNTER — Encounter (HOSPITAL_COMMUNITY): Payer: Self-pay | Admitting: Physician Assistant

## 2023-02-17 ENCOUNTER — Other Ambulatory Visit (HOSPITAL_COMMUNITY): Payer: Self-pay

## 2023-02-17 DIAGNOSIS — F411 Generalized anxiety disorder: Secondary | ICD-10-CM

## 2023-02-17 DIAGNOSIS — F331 Major depressive disorder, recurrent, moderate: Secondary | ICD-10-CM

## 2023-02-17 MED ORDER — TRAZODONE HCL 50 MG PO TABS
50.0000 mg | ORAL_TABLET | Freq: Every day | ORAL | 2 refills | Status: DC
  Filled 2023-02-17: qty 30, 30d supply, fill #0

## 2023-02-17 MED ORDER — SERTRALINE HCL 100 MG PO TABS
150.0000 mg | ORAL_TABLET | Freq: Every day | ORAL | 2 refills | Status: DC
  Filled 2023-02-17: qty 45, 30d supply, fill #0
  Filled 2023-03-21: qty 45, 30d supply, fill #1
  Filled 2023-04-12: qty 45, 30d supply, fill #2

## 2023-02-17 NOTE — Progress Notes (Signed)
BH MD/PA/NP OP Progress Note  02/17/2023 5:42 PM Christian [name]  MRN:  [id number]  Chief Complaint:  Chief Complaint  Patient presents with   Follow-up   Medication Refill   HPI:   Christian [name]. Sandberg is a 47 year old, Caucasian male with a past psychiatric history significant for anxiety and major depressive disorder who presents to [facility] for follow-up and medication management.  Patient is currently being managed on the following psychiatric medication:   Sertraline 150 mg daily. 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  Patient reports that this job has alleviated some of his depression.  He states that he enjoys interacting with people while on the job. Patient does express experiencing some sadness related to having to celebrate the holidays alone.  Patient also reports that he is discouraged for still living in a homeless shelter. Patient endorses some good news in the form of his SSI documents advancing further in the decision process.  Patient endorses experiencing drowsiness in the afternoon attributed to his use of Zoloft.  Patient endorses receiving a normal amount of sleep and is currently utilizing a CPAP machine due to his history of sleep apnea.  Patient continues to endorse some depression and rates his depression as 5 out of 10 with 10 being most severe.  Patient endorses depressive episodes 2 to 3 days out of the week.  Patient endorses the following depressive symptoms: feelings of sadness, irritability, decreased energy, and hopelessness.  Patient denies lack of motivation, decreased concentration, or feelings of guilt/worthlessness.  Patient also endorses anxiety attributed to his current living situation.  A GAD-7 screen was performed with the patient scoring at 13.  Patient is alert and oriented x 4, calm, cooperative, and fully engaged in  conversation during the encounter.  Patient endorses content mood and states that he feels optimistic about his future.  Patient denies suicidal or homicidal ideations.  He further denies auditory or visual hallucinations and does not appear to be responding to internal/external stimuli.  Patient endorses fair sleep and receives on average 8 to 8-1/2 hours of sleep per night.  Patient endorses good appetite and eats on average 3 meals per day.  Patient denies alcohol consumption, tobacco use, or illicit drug use.  Visit Diagnosis:    ICD-10-CM   1. MDD (major depressive disorder), recurrent episode, moderate (HCC)  F33.1 sertraline (ZOLOFT) 100 MG tablet    traZODone (DESYREL) 50 MG tablet    2. GAD (generalized anxiety disorder)  F41.1 sertraline (ZOLOFT) 100 MG tablet       Past Psychiatric History:  Patient reports that when he was first hospitalized due to mental health, he was diagnosed with anxiety and depression. Patient states that he was started on antidepressants during that time but never stayed consistent with his medication management   Past Medical History:  Past Medical History:  Diagnosis Date   Allergic rhinitis    Anxiety    CHF (congestive heart failure) (HCC)    Depression    Depression    Elevated blood pressure reading without diagnosis of hypertension    History of chicken pox    Obesity    Pleural effusion    Sepsis (HCC)    Sleep apnea    O2 at night    Past Surgical History:  Procedure Laterality Date   COLON SURGERY     perforated bowel  TONSILLECTOMY AND ADENOIDECTOMY  03/01/1983    Family Psychiatric History:  Mother - patient reports that his mother had cancer when he was young and she was referred to a mental health facility Uncle (paternal) - possible schizophrenia, patient reports that his family suspected that his uncle may have been schizophrenic because he would see things   Family history of suicide: Patient denies, but states that his  mother's, who was adopted, cousin attempted suicide.  Patient states that they were not blood related. Family history of homicide: Patient denies Family history of substance abuse: Patient denies  Family History:  Family History  Problem Relation Age of Onset   Stroke Mother    Diabetes Mother        pre-diabetes   Heart disease Mother        CHF, pacer   Cancer Mother        lymphoma   Stomach cancer Mother    Clotting disorder Mother    Coronary artery disease Father 49       MI   Hypertension Father    Hyperlipidemia Father    Diabetes Father    Heart attack Father    Migraines Sister    Stroke Paternal Grandmother    Diabetes Paternal Grandmother    Heart disease Paternal Grandfather    Parkinson's disease Paternal Grandfather    Heart attack Paternal Grandfather     Social History:  Social History   Socioeconomic History   Marital status: Single    Spouse name: Not on file   Number of children: 0   Years of education: Not on file   Highest education level: 12th grade  Occupational History   Not on file  Tobacco Use   Smoking status: Never   Smokeless tobacco: Never   Tobacco comments:    Living in homeless shelter  Vaping Use   Vaping status: Never Used  Substance and Sexual Activity   Alcohol use: Yes    Alcohol/week: 2.0 standard drinks of alcohol    Types: 2 Cans of beer per week    Comment: 1-2 beers 1-2 days a week   Drug use: Never   Sexual activity: Not Currently  Other Topics Concern   Not on file  Social History Narrative   Caffeine: occasional, lives at a shelter [employer])  Occupation: works at [employer] part timeEdu: some college Diet: some water, some fruits/vegetables, 1-2x/wk red meat, fish 1x/wk   Social Drivers of Corporate investment banker Strain: High Risk (07/01/2022)   Overall Financial Resource Strain (CARDIA)    Difficulty of Paying Living Expenses: Very hard  Food Insecurity: Food Insecurity Present (12/22/2022)    Hunger Vital Sign    Worried About Running Out of Food in the Last Year: Sometimes true    Ran Out of Food in the Last Year: Sometimes true  Transportation Needs: Unmet Transportation Needs (11/19/2022)   PRAPARE - Transportation    Lack of Transportation (Medical): Yes    Lack of Transportation (Non-Medical): Yes  Physical Activity: Inactive (07/01/2022)   Exercise Vital Sign    Days of Exercise per Week: 0 days    Minutes of Exercise per Session: 20 min  Stress: Stress Concern Present (07/01/2022)   [employer] of Occupational Health - Occupational Stress Questionnaire    Feeling of Stress : To some extent  Social Connections: Moderately Integrated (07/01/2022)   Social Connection and Isolation Panel [NHANES]    Frequency of Communication with Friends and Family:  More than three times a week    Frequency of Social Gatherings with Friends and Family: Once a week    Attends Religious Services: More than 4 times per year    Active Member of [employer] or Organizations: Yes    Attends Banker Meetings: 1 to 4 times per year    Marital Status: Never married  Recent Concern: Social Connections - Moderately Isolated (05/11/2022)   Social Connection and Isolation Panel [NHANES]    Frequency of Communication with Friends and Family: More than three times a week    Frequency of Social Gatherings with Friends and Family: Twice a week    Attends Religious Services: More than 4 times per year    Active Member of [employer] or Organizations: No    Attends Banker Meetings: Never    Marital Status: Never married    Allergies:  Allergies  Allergen Reactions   Shellfish Allergy Nausea Only    Metabolic Disorder Labs: Lab Results  Component Value Date   HGBA1C 5.8 (H) 11/19/2022   MPG 119.76 11/19/2022   MPG 125.5 10/09/2020   No results found for: "PROLACTIN" Lab Results  Component Value Date   CHOL 153 12/24/2020   TRIG 126 12/24/2020   HDL 42 12/24/2020   CHOLHDL  3.6 12/24/2020   LDLCALC 88 12/24/2020   Lab Results  Component Value Date   TSH 2.290 12/24/2020   TSH 2.650 10/09/2020    Therapeutic Level Labs: No results found for: "LITHIUM" No results found for: "VALPROATE" No results found for: "CBMZ"  Current Medications: Current Outpatient Medications  Medication Sig Dispense Refill   albuterol (VENTOLIN HFA) 108 (90 Base) MCG/ACT inhaler Inhale 2 puffs into the lungs every 6 (six) hours as needed for wheezing or shortness of breath. 8 g 3   fluticasone (FLONASE) 50 MCG/ACT nasal spray Spray 2 sprays into both nostrils once daily. (Patient taking differently: Place 2 sprays into both nostrils daily as needed for allergies.) 16 g 2   fluticasone-salmeterol (ADVAIR HFA) 115-21 MCG/ACT inhaler Inhale 2 puffs into the lungs 2 (two) times daily. 12 g 12   meloxicam (MOBIC) 15 MG tablet Take 1 tablet (15 mg total) by mouth daily. 30 tablet 2   sertraline (ZOLOFT) 100 MG tablet Take 1.5 tablets (150 mg total) by mouth at bedtime. 45 tablet 2   torsemide (DEMADEX) 20 MG tablet Take 2 tablets (40 mg total) by mouth 2 (two) times daily. 120 tablet 2   traZODone (DESYREL) 50 MG tablet Take 1 tablet (50 mg total) by mouth at bedtime. 30 tablet 2   Current Facility-Administered Medications  Medication Dose Route Frequency Provider Last Rate Last Admin   cholecalciferol (VITAMIN D3) tablet 1,000 Units  1,000 Units Oral Daily Regalado, Belkys A, MD         Musculoskeletal: Strength & Muscle Tone: within normal limits Gait & Station:  Patient ambulates with a cane Patient leans: N/A  Psychiatric Specialty Exam: Review of Systems  Psychiatric/Behavioral:  Positive for sleep disturbance. Negative for decreased concentration, dysphoric mood, hallucinations, self-injury and suicidal ideas. The patient is nervous/anxious. The patient is not hyperactive.     Blood pressure 128/74, pulse 69, height 6\' 2"  (1.88 m), weight (!) 408 lb 3.2 oz (185.2 kg), SpO2  95%.Body mass index is 52.41 kg/m.  General Appearance: Casual  Eye Contact:  Good  Speech:  Clear and Coherent and Normal Rate  Volume:  Normal  Mood:  Anxious and Depressed  Affect:  Appropriate  Thought Process:  Coherent, Goal Directed, and Descriptions of Associations: Intact  Orientation:  Full (Time, Place, and Person)  Thought Content: WDL   Suicidal Thoughts:  No  Homicidal Thoughts:  No  Memory:  Immediate;   Good Recent;   Good Remote;   Fair  Judgement:  Fair  Insight:  Fair  Psychomotor Activity:  Normal  Concentration:  Concentration: Good and Attention Span: Good  Recall:  Good  Fund of Knowledge: Fair  Language: Good  Akathisia:  No  Handed:  Left  AIMS (if indicated): not done  Assets:  Communication Skills Desire for Improvement Housing Social Support  ADL's:  Intact  Cognition: WNL  Sleep:   Patient has a history of sleep apnea. Patient reports receiving on average 8 to 8-1/2 hours sleep per night     Screenings: AUDIT    Flowsheet Row Office Visit from 07/05/2022 in [facility] Primary [facility] at [facility]  Alcohol Use Disorder Identification Test Final Score (AUDIT) 3      GAD-7    Flowsheet Row Clinical Support from 02/17/2023 in [facility] Clinical Support from 12/16/2022 in [facility] Office Visit from 12/09/2022 in [facility] Primary Care at [facility] Counselor from 11/29/2022 in [facility] Video Visit from 10/14/2022 in [facility]  Total GAD-7 Score 13 11 5 9 8       PHQ2-9    Flowsheet Row Clinical Support from 02/17/2023 in [facility] Nutrition from 12/22/2022 in [facility]  [facility] Clinical Support from 12/16/2022 in [facility] Office Visit from 12/09/2022 in [facility] Primary Care at [facility]  Counselor from 11/29/2022 in [facility]  PHQ-2 Total Score 1 1 1 2 2   PHQ-9 Total Score -- -- -- 5 7      Flowsheet Row Clinical Support from 02/17/2023 in [facility] Clinical Support from 12/16/2022 in [facility] ED to Hosp-Admission (Discharged) from 11/18/2022 in [facility] 5 EAST MEDICAL UNIT  C-SSRS RISK CATEGORY No Risk No Risk No Risk        Assessment and Plan:   Agastya Lupe. Darga is a 47 year old, Caucasian male with a past psychiatric history significant for anxiety and major depressive disorder who presents to [facility] via virtual video visit for follow-up and medication management.  Patient presents to the encounter stating that he has been experiencing some drowsiness that he attributes to his use of Zoloft.  He reports that he gets an adequate amount of sleep at night and sleeps with a CPAP machine due to his history of sleep apnea.  Patient continues to endorse some depression as well as anxiety attributed to his current stressors of life.  Patient denies the need for dosage adjustments at this time but is requesting he take his Zoloft at bedtime to help avoid from experiencing drowsiness in the afternoon.  Provider to adjust patient's Zoloft schedule so that he is taking it at night.  Patient's medications to be e-prescribed to pharmacy of choice.  Collaboration of Care: Collaboration of Care: Medication Management AEB provider managing patient's psychiatric medications, Psychiatrist AEB patient being seen by mental health provider at this facility, and Referral or follow-up with counselor/therapist AEB patient being seen by a licensed clinical social worker at  this facility  Patient/Guardian was advised Release of Information must be obtained prior to any record release in order to collaborate their care with an outside provider.  Patient/Guardian was advised if they have not already done so to contact the registration department to sign all necessary forms in order for [place] to release information regarding their care.   Consent: Patient/Guardian gives verbal consent for treatment and assignment of benefits for services provided during this visit. Patient/Guardian expressed understanding and agreed to proceed.   1. MDD (major depressive disorder), recurrent episode, moderate (HCC)  - sertraline (ZOLOFT) 100 MG tablet; Take 1.5 tablets (150 mg total) by mouth at bedtime.  Dispense: 45 tablet; Refill: 2 - traZODone (DESYREL) 50 MG tablet; Take 1 tablet (50 mg total) by mouth at bedtime.  Dispense: 30 tablet; Refill: 2  2. GAD (generalized anxiety disorder)  - sertraline (ZOLOFT) 100 MG tablet; Take 1.5 tablets (150 mg total) by mouth at bedtime.  Dispense: 45 tablet; Refill: 2  Patient to follow-up in 2 months Provider spent a total of 26 minutes with the patient/reviewing patient's chart  Meta Hatchet, PA 02/17/2023, 5:42 PM

## 2023-02-21 ENCOUNTER — Ambulatory Visit (HOSPITAL_COMMUNITY): Payer: MEDICAID | Admitting: Licensed Clinical Social Worker

## 2023-02-21 ENCOUNTER — Encounter (HOSPITAL_COMMUNITY): Payer: Self-pay

## 2023-03-02 ENCOUNTER — Other Ambulatory Visit: Payer: Self-pay

## 2023-03-02 ENCOUNTER — Encounter: Payer: Self-pay | Admitting: *Deleted

## 2023-03-02 ENCOUNTER — Other Ambulatory Visit (HOSPITAL_COMMUNITY): Payer: Self-pay

## 2023-03-02 ENCOUNTER — Encounter: Payer: Self-pay | Admitting: Critical Care Medicine

## 2023-03-02 ENCOUNTER — Other Ambulatory Visit (HOSPITAL_BASED_OUTPATIENT_CLINIC_OR_DEPARTMENT_OTHER): Payer: Self-pay

## 2023-03-02 ENCOUNTER — Emergency Department (HOSPITAL_COMMUNITY)
Admission: EM | Admit: 2023-03-02 | Discharge: 2023-03-02 | Disposition: A | Discharge status: Home / Self Care | Payer: MEDICAID | Attending: Emergency Medicine | Admitting: Emergency Medicine

## 2023-03-02 DIAGNOSIS — A084 Viral intestinal infection, unspecified: Secondary | ICD-10-CM | POA: Insufficient documentation

## 2023-03-02 DIAGNOSIS — E860 Dehydration: Secondary | ICD-10-CM | POA: Diagnosis not present

## 2023-03-02 DIAGNOSIS — E669 Obesity, unspecified: Secondary | ICD-10-CM | POA: Insufficient documentation

## 2023-03-02 DIAGNOSIS — D72829 Elevated white blood cell count, unspecified: Secondary | ICD-10-CM | POA: Diagnosis not present

## 2023-03-02 DIAGNOSIS — Z20822 Contact with and (suspected) exposure to covid-19: Secondary | ICD-10-CM | POA: Insufficient documentation

## 2023-03-02 DIAGNOSIS — R112 Nausea with vomiting, unspecified: Secondary | ICD-10-CM | POA: Diagnosis present

## 2023-03-02 LAB — LIPASE, BLOOD: Lipase: 46 U/L (ref 11–51)

## 2023-03-02 LAB — COMPREHENSIVE METABOLIC PANEL
ALT: 17 U/L (ref 0–44)
AST: 22 U/L (ref 15–41)
Albumin: 4.4 g/dL (ref 3.5–5.0)
Alkaline Phosphatase: 90 U/L (ref 38–126)
Anion gap: 10 (ref 5–15)
BUN: 23 mg/dL — ABNORMAL HIGH (ref 6–20)
CO2: 27 mmol/L (ref 22–32)
Calcium: 8.9 mg/dL (ref 8.9–10.3)
Chloride: 101 mmol/L (ref 98–111)
Creatinine, Ser: 1.29 mg/dL — ABNORMAL HIGH (ref 0.61–1.24)
GFR, Estimated: >60 mL/min (ref >60)
Glucose, Bld: 155 mg/dL — ABNORMAL HIGH (ref 70–99)
Potassium: 3.6 mmol/L (ref 3.5–5.1)
Sodium: 138 mmol/L (ref 135–145)
Total Bilirubin: 0.7 mg/dL (ref 0.0–1.2)
Total Protein: 8.1 g/dL (ref 6.5–8.1)

## 2023-03-02 LAB — RESP PANEL BY RT-PCR (RSV, FLU A&B, COVID)  RVPGX2
Influenza A by PCR: NEGATIVE
Influenza B by PCR: NEGATIVE
Resp Syncytial Virus by PCR: NEGATIVE
SARS Coronavirus 2 by RT PCR: NEGATIVE

## 2023-03-02 LAB — CBC
HCT: 51 % (ref 39.0–52.0)
Hemoglobin: 16.8 g/dL (ref 13.0–17.0)
MCH: 27.5 pg (ref 26.0–34.0)
MCHC: 32.9 g/dL (ref 30.0–36.0)
MCV: 83.6 fL (ref 80.0–100.0)
Platelets: 307 10*3/uL (ref 150–400)
RBC: 6.1 MIL/uL — ABNORMAL HIGH (ref 4.22–5.81)
RDW: 13.8 % (ref 11.5–15.5)
WBC: 15.7 10*3/uL — ABNORMAL HIGH (ref 4.0–10.5)
nRBC: 0 % (ref 0.0–0.2)

## 2023-03-02 LAB — URINALYSIS, ROUTINE W REFLEX MICROSCOPIC
Bilirubin Urine: NEGATIVE
Glucose, UA: NEGATIVE mg/dL
Hgb urine dipstick: NEGATIVE
Ketones, ur: NEGATIVE mg/dL
Leukocytes,Ua: NEGATIVE
Nitrite: NEGATIVE
Protein, ur: NEGATIVE mg/dL
Specific Gravity, Urine: 1.015 (ref 1.005–1.030)
pH: 5 (ref 5.0–8.0)

## 2023-03-02 MED ORDER — ONDANSETRON 4 MG PO TBDP
4.0000 mg | ORAL_TABLET | Freq: Once | ORAL | Status: AC
  Administered 2023-03-02: 4 mg via ORAL
  Filled 2023-03-02: qty 1

## 2023-03-02 MED ORDER — ONDANSETRON 4 MG PO TBDP
4.0000 mg | ORAL_TABLET | Freq: Three times a day (TID) | ORAL | 0 refills | Status: DC | PRN
  Filled 2023-03-02 (×2): qty 20, 7d supply, fill #0

## 2023-03-02 MED ORDER — LOPERAMIDE HCL 2 MG PO CAPS
2.0000 mg | ORAL_CAPSULE | Freq: Four times a day (QID) | ORAL | 0 refills | Status: DC | PRN
Start: 2023-03-02 — End: 2023-03-22
  Filled 2023-03-02 (×2): qty 12, 3d supply, fill #0

## 2023-03-02 NOTE — ED Triage Notes (Signed)
 N/V/D x 6 hours  Body aches x 6 hours  SOB on exertion x 3 days, hx of asthma , lungs sounds clear and equal per EMS,  pt RR are even and unlabored in triage ,pt able to speak in full sentences, all VSS for EMS.

## 2023-03-02 NOTE — ED Notes (Signed)
 Pt tolerated PO fluids without vomiting. EDP aware.

## 2023-03-02 NOTE — ED Provider Notes (Signed)
  EMERGENCY DEPARTMENT AT [facility] Provider Note   CSN: [id number] Arrival date & time: 03/02/23  0158     History  Chief Complaint  Patient presents with   Vomiting    Christian [name] is a 48 y.o. male.  HPI     This is a 48 year old male who presents with nausea, vomiting, diarrhea.  Onset of symptoms last night.  He reports multiple episodes of nonbilious, nonbloody emesis and nonbloody diarrhea.  No known sick contacts.  No fevers.  Does report some crampy abdominal pain and bodyaches.  Home Medications Prior to Admission medications   Medication Sig Start Date End Date Taking? Authorizing Provider  loperamide  (IMODIUM ) 2 MG capsule Take 1 capsule (2 mg total) by mouth 4 (four) times daily as needed for diarrhea or loose stools. 03/02/23  Yes Jianna Drabik, Charmaine FALCON, MD  ondansetron  (ZOFRAN -ODT) 4 MG disintegrating tablet Take 1 tablet (4 mg total) by mouth every 8 (eight) hours as needed. 03/02/23  Yes Treniya Lobb, Charmaine FALCON, MD  albuterol  (VENTOLIN  HFA) 108 (90 Base) MCG/ACT inhaler Inhale 2 puffs into the lungs every 6 (six) hours as needed for wheezing or shortness of breath. 05/06/22   Parrett, Madelin RAMAN, NP  fluticasone  (FLONASE ) 50 MCG/ACT nasal spray Spray 2 sprays into both nostrils once daily. Patient taking differently: Place 2 sprays into both nostrils daily as needed for allergies. 03/02/22   Tanda Bleacher, MD  fluticasone -salmeterol (ADVAIR  HFA) 115-21 MCG/ACT inhaler Inhale 2 puffs into the lungs 2 (two) times daily. 08/31/22   Parrett, Madelin RAMAN, NP  meloxicam  (MOBIC ) 15 MG tablet Take 1 tablet (15 mg total) by mouth daily. 11/17/22   Tanda Bleacher, MD  sertraline  (ZOLOFT ) 100 MG tablet Take 1.5 tablets (150 mg total) by mouth at bedtime. 02/17/23   Nwoko, Uchenna E, PA  torsemide  (DEMADEX ) 20 MG tablet Take 2 tablets (40 mg total) by mouth 2 (two) times daily. 02/13/23 03/16/23  Tanda Bleacher, MD  traZODone  (DESYREL ) 50 MG tablet Take 1 tablet (50 mg total)  by mouth at bedtime. 02/17/23   Nwoko, Uchenna E, PA      Allergies    Shellfish allergy    Review of Systems   Review of Systems  Constitutional:  Negative for fever.  Gastrointestinal:  Positive for abdominal pain, diarrhea, nausea and vomiting.  Musculoskeletal:  Positive for myalgias.  All other systems reviewed and are negative.   Physical Exam Updated Vital Signs BP (!) 144/78   Pulse 84   Temp 99.3 F (37.4 C) (Oral)   Resp 18   SpO2 96%  Physical Exam Vitals and nursing note reviewed.  Constitutional:      Appearance: He is well-developed. He is obese. He is not ill-appearing.  HENT:     Head: Normocephalic and atraumatic.  Eyes:     Pupils: Pupils are equal, round, and reactive to light.  Cardiovascular:     Rate and Rhythm: Normal rate and regular rhythm.     Heart sounds: Normal heart sounds. No murmur heard. Pulmonary:     Effort: Pulmonary effort is normal. No respiratory distress.     Breath sounds: Normal breath sounds. No wheezing.  Abdominal:     General: Bowel sounds are normal.     Palpations: Abdomen is soft.     Tenderness: There is no abdominal tenderness. There is no rebound.  Musculoskeletal:     Cervical back: Neck supple.  Lymphadenopathy:     Cervical: No cervical adenopathy.  Skin:    General: Skin is warm and dry.  Neurological:     Mental Status: He is alert and oriented to person, place, and time.  Psychiatric:        Mood and Affect: Mood normal.     ED Results / Procedures / Treatments   Labs (all labs ordered are listed, but only abnormal results are displayed) Labs Reviewed  COMPREHENSIVE METABOLIC PANEL - Abnormal; Notable for the following components:      Result Value   Glucose, Bld 155 (*)    BUN 23 (*)    Creatinine, Ser 1.29 (*)    All other components within normal limits  CBC - Abnormal; Notable for the following components:   WBC 15.7 (*)    RBC 6.10 (*)    All other components within normal limits  RESP  PANEL BY RT-PCR (RSV, FLU A&B, COVID)  RVPGX2  LIPASE, BLOOD  URINALYSIS, ROUTINE W REFLEX MICROSCOPIC    EKG None  Radiology No results found.  Procedures Procedures    Medications Ordered in ED Medications  ondansetron  (ZOFRAN -ODT) disintegrating tablet 4 mg (4 mg Oral Given 03/02/23 9395)    ED Course/ Medical Decision Making/ A&P                                 Medical Decision Making Amount and/or Complexity of Data Reviewed Labs: ordered.  Risk Prescription drug management.   This patient presents to the ED for concern of nausea, vomiting, diarrhea, this involves an extensive number of treatment options, and is a complaint that carries with it a high risk of complications and morbidity.  I considered the following differential and admission for this acute, potentially life threatening condition.  The differential diagnosis includes illness, gastritis, gastroenteritis, colitis, SBO  MDM:    This is a 48 year old male who presents with nausea, vomiting, diarrhea.  He is nontoxic.  Vital signs are reassuring.  Abdominal exam is benign.  Labs notable slight leukocytosis and elevated hemoglobin.  This in the setting of mild AKI is highly suggestive of dehydration.  He is not having any active vomiting.  He is able to orally hydrate.  Highly suspect viral illness given benign abdominal exam.  Lower suspicion for SBO or appendicitis.  Patient given Zofran .  Will discharge with Zofran  and Imodium .  Recommend aggressive oral hydration with electrolyte rich fluids at home.  (Labs, imaging, consults)  Labs: I Ordered, and personally interpreted labs.  The pertinent results include: CBC, CMP, lipase, urinalysis, COVID and influenza testing  Imaging Studies ordered: I ordered imaging studies including none I independently visualized and interpreted imaging. I agree with the radiologist interpretation  Additional history obtained from chart review.  External records from outside  source obtained and reviewed including prior evaluations  Cardiac Monitoring: The patient was not maintained on a cardiac monitor.  If on the cardiac monitor, I personally viewed and interpreted the cardiac monitored which showed an underlying rhythm of: N/A  Reevaluation: After the interventions noted above, I reevaluated the patient and found that they have :improved  Social Determinants of Health:  lives independently  Disposition: Discharge  Co morbidities that complicate the patient evaluation  Past Medical History:  Diagnosis Date   Allergic rhinitis    Anxiety    CHF (congestive heart failure) (HCC)    Depression    Depression    Elevated blood pressure reading without diagnosis of hypertension  History of chicken pox    Obesity    Pleural effusion    Sepsis (HCC)    Sleep apnea    O2 at night     Medicines Meds ordered this encounter  Medications   ondansetron  (ZOFRAN -ODT) disintegrating tablet 4 mg   ondansetron  (ZOFRAN -ODT) 4 MG disintegrating tablet    Sig: Take 1 tablet (4 mg total) by mouth every 8 (eight) hours as needed.    Dispense:  20 tablet    Refill:  0   loperamide  (IMODIUM ) 2 MG capsule    Sig: Take 1 capsule (2 mg total) by mouth 4 (four) times daily as needed for diarrhea or loose stools.    Dispense:  12 capsule    Refill:  0    I have reviewed the patients home medicines and have made adjustments as needed  Problem List / ED Course: Problem List Items Addressed This Visit   None Visit Diagnoses       Viral gastroenteritis    -  Primary     Dehydration                       Final Clinical Impression(s) / ED Diagnoses Final diagnoses:  Viral gastroenteritis  Dehydration    Rx / DC Orders ED Discharge Orders          Ordered    ondansetron  (ZOFRAN -ODT) 4 MG disintegrating tablet  Every 8 hours PRN        03/02/23 0631    loperamide  (IMODIUM ) 2 MG capsule  4 times daily PRN        03/02/23 0631               Bari Charmaine FALCON, MD 03/02/23 [phone]

## 2023-03-02 NOTE — Discharge Instructions (Addendum)
 You were seen today for vomiting and diarrhea.  Take Zofran and Imodium at home as needed for symptoms.  This is likely viral in nature.  Make sure that you are staying hydrated with electrolyte rich fluids.

## 2023-03-02 NOTE — ED Notes (Signed)
Pt provided with ginger ale 

## 2023-03-02 NOTE — Congregational Nurse Program (Signed)
  Dept: [phone]   Congregational Nurse Program Note  Date of Encounter: 03/02/2023  Past Medical History: Past Medical History:  Diagnosis Date   Allergic rhinitis    Anxiety    CHF (congestive heart failure) (HCC)    Depression    Depression    Elevated blood pressure reading without diagnosis of hypertension    History of chicken pox    Obesity    Pleural effusion    Sepsis (HCC)    Sleep apnea    O2 at night    Encounter Details:  Community Questionnaire - 03/02/23 1245       Questionnaire   Ask client: Do you give verbal consent for me to treat you today? Yes    Student Assistance N/A    Location Patient Served  [facility]    Encounter Setting CN site    Population Status Unhoused    Insurance Medicaid    Insurance/Financial Assistance Referral N/A    Medication Have Medication Insecurities;Provided Medication Assistance    Medical Provider Yes    Screening Referrals Made N/A    Medical Referrals Made N/A    Medical Appointment Completed N/A    CNP Interventions Advocate/Support;Educate;Case Management    Screenings CN Performed N/A    ED Visit Averted N/A    Life-Saving Intervention Made N/A            Client seen at [facility] and reporting he went to ED early morning and needed help getting his medications. Assisted client with space away from others due to decreasing spread of virus. Picked up medication at [facility] and brought to client at [facility].Gave IV liquid as directed by [facility] MD. Madison ORN RN CN

## 2023-03-03 NOTE — Progress Notes (Signed)
 This is a primary care patient of primary care [place] seen at the [place] [facility] he is had abdominal pain nausea vomiting diarrhea he went to the emergency room yesterday symptoms been ongoing for 24 hours.  He was given medication for nausea and vomiting and has been hydrating.  Today still symptomatic.  There has been a norovirus outbreak in the community.  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

## 2023-03-06 ENCOUNTER — Encounter: Payer: Self-pay | Admitting: *Deleted

## 2023-03-06 ENCOUNTER — Encounter: Payer: Self-pay | Admitting: Family Medicine

## 2023-03-06 NOTE — Congregational Nurse Program (Addendum)
  Dept: [phone]   Congregational Nurse Program Note  Date of Encounter: 03/06/2023  Past Medical History: Past Medical History:  Diagnosis Date   Allergic rhinitis    Anxiety    CHF (congestive heart failure) (HCC)    Depression    Depression    Elevated blood pressure reading without diagnosis of hypertension    History of chicken pox    Obesity    Pleural effusion    Sepsis (HCC)    Sleep apnea    O2 at night    Encounter Details:  Community Questionnaire - 03/06/23 1329       Questionnaire   Ask client: Do you give verbal consent for me to treat you today? Yes    Student Assistance N/A    Location Patient Served  [facility]    Encounter Setting CN site;Phone/Text/Email    Population Status Unhoused    Insurance [employer]    Insurance/Financial Assistance Referral N/A    Medication Have Medication Insecurities;Provided Medication Assistance    Medical Provider Yes    Screening Referrals Made N/A    Medical Referrals Made [facility] PCP/Clinic    Medical Appointment Completed N/A    CNP Interventions Advocate/Support;Case Management;Navigate Healthcare System    Screenings CN Performed N/A    ED Visit Averted N/A    Life-Saving Intervention Made N/A            Client seen at [facility] asking about his medications and when to start taking again following norovirus symptoms. Per call with MD take Torsemide  as needed and hold meloxicam  at current time. Client educated about need to continue hand hygiene. Contacted PCP DR Wilson's office to advise of recent hospital visit and inquire if she wanted to see client earlier than next scheduled appt. Doctor's office is to contact client if she want's to reschedule earlier than January 13th.  Juliyah Mergen W RN CN

## 2023-03-09 ENCOUNTER — Telehealth: Payer: Self-pay | Admitting: Family Medicine

## 2023-03-09 NOTE — Telephone Encounter (Signed)
 A document form has been faxed:  [place] [facility] Request for Prior Approval , to be filled out by provider. Send document back via Fax within 5-days. Document is located in providers tray at front office.          Fax number:  [phone]

## 2023-03-13 ENCOUNTER — Ambulatory Visit (INDEPENDENT_AMBULATORY_CARE_PROVIDER_SITE_OTHER): Payer: MEDICAID | Admitting: Family Medicine

## 2023-03-13 ENCOUNTER — Encounter: Payer: Self-pay | Admitting: Family Medicine

## 2023-03-13 ENCOUNTER — Other Ambulatory Visit: Payer: Self-pay

## 2023-03-13 VITALS — BP 131/85 | HR 69 | Temp 98.1°F | Resp 16 | Ht 74.0 in | Wt 394.5 lb

## 2023-03-13 DIAGNOSIS — Z5941 Food insecurity: Secondary | ICD-10-CM

## 2023-03-13 DIAGNOSIS — Z5986 Financial insecurity: Secondary | ICD-10-CM

## 2023-03-13 DIAGNOSIS — K921 Melena: Secondary | ICD-10-CM

## 2023-03-13 DIAGNOSIS — Z6841 Body Mass Index (BMI) 40.0 and over, adult: Secondary | ICD-10-CM

## 2023-03-13 DIAGNOSIS — E66813 Obesity, class 3: Secondary | ICD-10-CM

## 2023-03-13 DIAGNOSIS — Z23 Encounter for immunization: Secondary | ICD-10-CM | POA: Diagnosis not present

## 2023-03-13 DIAGNOSIS — Z5900 Homelessness unspecified: Secondary | ICD-10-CM

## 2023-03-13 DIAGNOSIS — I10 Essential (primary) hypertension: Secondary | ICD-10-CM | POA: Diagnosis not present

## 2023-03-13 DIAGNOSIS — R3981 Functional urinary incontinence: Secondary | ICD-10-CM | POA: Diagnosis not present

## 2023-03-13 MED ORDER — MELOXICAM 15 MG PO TABS
15.0000 mg | ORAL_TABLET | Freq: Every day | ORAL | 2 refills | Status: DC
  Filled 2023-03-13: qty 30, 30d supply, fill #0
  Filled 2023-04-12: qty 30, 30d supply, fill #1
  Filled 2023-05-16: qty 30, 30d supply, fill #2

## 2023-03-13 NOTE — Progress Notes (Signed)
Patient is here for their 6 month follow-up Patient would like to have his home delivered papers filled out Care gaps have been discussed with patient

## 2023-03-16 ENCOUNTER — Other Ambulatory Visit: Payer: Self-pay | Admitting: Physician Assistant

## 2023-03-16 ENCOUNTER — Other Ambulatory Visit: Payer: Self-pay

## 2023-03-16 ENCOUNTER — Other Ambulatory Visit (HOSPITAL_COMMUNITY): Payer: Self-pay

## 2023-03-16 ENCOUNTER — Encounter: Payer: Self-pay | Admitting: Physician Assistant

## 2023-03-16 ENCOUNTER — Telehealth: Payer: Self-pay | Admitting: Physician Assistant

## 2023-03-16 MED ORDER — POTASSIUM CHLORIDE CRYS ER 20 MEQ PO TBCR
EXTENDED_RELEASE_TABLET | ORAL | 1 refills | Status: DC
  Filled 2023-03-16 (×3): qty 30, 30d supply, fill #0
  Filled 2023-03-16: qty 30, 33d supply, fill #0

## 2023-03-16 NOTE — Progress Notes (Signed)
See note

## 2023-03-16 NOTE — Progress Notes (Signed)
Pt c/o increased DOE, cough.  He got generic Robutussin DM pills yesterday. Has taken them a couple of times, helps the cough some.   Uses Advair 2 puffs bid, had albuterol inh but does not have that now. Will contact [facility] pharmacy and see if can get it filled there.   Lungs are clear, O2 sats 95%  Uses [facility], gets meds mailed to him.   Uses CPAP at bedtime. Was using distilled water in it, we will order.   Norovirus last week, sx have resolved.   Was off torsemide 40 mg bid while having Norovirus, restarted it about a week ago.   Has 2+ LE edema, wt is up.  Will Increase AM Toresemide to 60 mg, continue 40 mg as pm dose x 3 days. F/u next week for wt check.   Last K+ on 01/02 was 3.6, with increase in torsemide, will get him K+ 20 meq to take x 3 days and then as directed.   Wt Readings from Last 3 Encounters:  03/16/23 (!) 402 lb 9.6 oz (182.6 kg)  03/13/23 (!) 394 lb 8 oz (178.9 kg)  01/02/23 (!) 396 lb 12.8 oz (180 kg)      Latest Ref Rng & Units 03/02/2023    2:28 AM 11/28/2022    4:53 AM 11/27/2022    6:57 AM  BMP  Glucose 70 - 99 mg/dL 161  96  92   BUN 6 - 20 mg/dL 23  24  21    Creatinine 0.61 - 1.24 mg/dL 0.96  0.45  4.09   Sodium 135 - 145 mmol/L 138  138  141   Potassium 3.5 - 5.1 mmol/L 3.6  3.1  3.2   Chloride 98 - 111 mmol/L 101  100  102   CO2 22 - 32 mmol/L 27  28  29    Calcium 8.9 - 10.3 mg/dL 8.9  8.4  8.4      Wendee Hata, PA-C 03/16/2023 11:31 AM

## 2023-03-16 NOTE — Progress Notes (Signed)
Established Patient Office Visit  Subjective    Patient ID: Christian [name], male    DOB: Jul 23, 1975  Age: 48 y.o. MRN: [id number]  CC:  Chief Complaint  Patient presents with   Medical Management of Chronic Issues    HPI Michaell Igleheart presents for follow up of hypertension. He also reports some incontinence that has been embarrassing and wants some adult supplies. He continues to have some blood in his stools but does have a scheduled appt to see consultant within a week or so.   Outpatient Encounter Medications as of 03/13/2023  Medication Sig   albuterol (VENTOLIN HFA) 108 (90 Base) MCG/ACT inhaler Inhale 2 puffs into the lungs every 6 (six) hours as needed for wheezing or shortness of breath.   fluticasone (FLONASE) 50 MCG/ACT nasal spray Spray 2 sprays into both nostrils once daily. (Patient taking differently: Place 2 sprays into both nostrils daily as needed for allergies.)   fluticasone-salmeterol (ADVAIR HFA) 115-21 MCG/ACT inhaler Inhale 2 puffs into the lungs 2 (two) times daily.   loperamide (IMODIUM) 2 MG capsule Take 1 capsule (2 mg total) by mouth 4 (four) times daily as needed for diarrhea or loose stools.   ondansetron (ZOFRAN-ODT) 4 MG disintegrating tablet Dissolve 1 tablet (4 mg total) by mouth every 8 (eight) hours as needed.   sertraline (ZOLOFT) 100 MG tablet Take 1.5 tablets (150 mg total) by mouth at bedtime.   torsemide (DEMADEX) 20 MG tablet Take 2 tablets (40 mg total) by mouth 2 (two) times daily.   traZODone (DESYREL) 50 MG tablet Take 1 tablet (50 mg total) by mouth at bedtime.   [DISCONTINUED] meloxicam (MOBIC) 15 MG tablet Take 1 tablet (15 mg total) by mouth daily.   meloxicam (MOBIC) 15 MG tablet Take 1 tablet (15 mg total) by mouth daily.   Facility-Administered Encounter Medications as of 03/13/2023  Medication   cholecalciferol (VITAMIN D3) tablet 1,000 Units    Past Medical History:  Diagnosis Date   Allergic rhinitis    Anxiety    CHF  (congestive heart failure) (HCC)    Depression    Depression    Elevated blood pressure reading without diagnosis of hypertension    History of chicken pox    Obesity    Pleural effusion    Sepsis (HCC)    Sleep apnea    O2 at night    Past Surgical History:  Procedure Laterality Date   COLON SURGERY     perforated bowel   TONSILLECTOMY AND ADENOIDECTOMY  03/01/1983    Family History  Problem Relation Age of Onset   Stroke Mother    Diabetes Mother        pre-diabetes   Heart disease Mother        CHF, pacer   Cancer Mother        lymphoma   Stomach cancer Mother    Clotting disorder Mother    Coronary artery disease Father 85       MI   Hypertension Father    Hyperlipidemia Father    Diabetes Father    Heart attack Father    Migraines Sister    Stroke Paternal Grandmother    Diabetes Paternal Grandmother    Heart disease Paternal Grandfather    Parkinson's disease Paternal Grandfather    Heart attack Paternal Grandfather     Social History   Socioeconomic History   Marital status: Single    Spouse name: Not on file   Number  of children: 0   Years of education: Not on file   Highest education level: Some college, no degree  Occupational History   Not on file  Tobacco Use   Smoking status: Never   Smokeless tobacco: Never   Tobacco comments:    Living in homeless shelter  Vaping Use   Vaping status: Never Used  Substance and Sexual Activity   Alcohol use: Yes    Alcohol/week: 2.0 standard drinks of alcohol    Types: 2 Cans of beer per week    Comment: 1-2 beers 1-2 days a week   Drug use: Never   Sexual activity: Not Currently  Other Topics Concern   Not on file  Social History Narrative   Caffeine: occasional, lives at a shelter [employer])  Occupation: works at [employer] part timeEdu: some college Diet: some water, some fruits/vegetables, 1-2x/wk red meat, fish 1x/wk   Social Drivers of Corporate investment banker Strain: High Risk  (03/06/2023)   Overall Financial Resource Strain (CARDIA)    Difficulty of Paying Living Expenses: Very hard  Food Insecurity: Food Insecurity Present (03/06/2023)   Hunger Vital Sign    Worried About Running Out of Food in the Last Year: Sometimes true    Ran Out of Food in the Last Year: Often true  Transportation Needs: Unmet Transportation Needs (03/06/2023)   PRAPARE - Administrator, Civil Service (Medical): Yes    Lack of Transportation (Non-Medical): Yes  Physical Activity: Inactive (03/06/2023)   Exercise Vital Sign    Days of Exercise per Week: 0 days    Minutes of Exercise per Session: 20 min  Stress: Stress Concern Present (03/06/2023)   [employer] of Occupational Health - Occupational Stress Questionnaire    Feeling of Stress : To some extent  Social Connections: Moderately Integrated (03/06/2023)   Social Connection and Isolation Panel [NHANES]    Frequency of Communication with Friends and Family: More than three times a week    Frequency of Social Gatherings with Friends and Family: Twice a week    Attends Religious Services: More than 4 times per year    Active Member of [employer] or Organizations: Yes    Attends Banker Meetings: More than 4 times per year    Marital Status: Never married  Intimate Partner Violence: Not At Risk (03/13/2023)   Humiliation, Afraid, Rape, and Kick questionnaire    Fear of Current or Ex-Partner: No    Emotionally Abused: No    Physically Abused: No    Sexually Abused: No    Review of Systems  All other systems reviewed and are negative.       Objective    BP 131/85   Pulse 69   Temp 98.1 F (36.7 C) (Oral)   Resp 16   Ht 6\' 2"  (1.88 m)   Wt (!) 394 lb 8 oz (178.9 kg)   SpO2 93%   BMI 50.65 kg/m   Physical Exam Vitals and nursing note reviewed.  Constitutional:      General: He is not in acute distress.    Appearance: He is obese.  Cardiovascular:     Rate and Rhythm: Normal rate and regular  rhythm.  Pulmonary:     Effort: Pulmonary effort is normal. No respiratory distress.     Breath sounds: Normal breath sounds. No wheezing.  Abdominal:     Palpations: Abdomen is soft.     Tenderness: There is no abdominal tenderness.  Musculoskeletal:     Right lower leg: No edema.     Left lower leg: No edema.     Comments: Utilizing rollator for stability  Neurological:     General: No focal deficit present.     Mental Status: He is alert and oriented to person, place, and time.  Psychiatric:        Mood and Affect: Mood and affect normal.        Speech: Speech normal.        Behavior: Behavior normal. Behavior is cooperative.         Assessment & Plan:   1. Essential hypertension (Primary) Appears stable. Continue   2. Class 3 severe obesity due to excess calories with serious comorbidity and body mass index (BMI) of 50.0 to 59.9 in adult (HCC)   3. Functional urinary incontinence Authorized adult incontinence supplies.  4. Blood in stool Keep scheduled appt with consultant  5. Homeless   6. Immunization due  - Pneumococcal polysaccharide vaccine 23-valent greater than or equal to 2yo subcutaneous/IM   Return in about 3 months (around 06/11/2023) for follow up, chronic med issues.   Tommie Raymond, MD

## 2023-03-16 NOTE — Telephone Encounter (Signed)
Contacted the [facility] re: alb inh  Currently at [facility], pt requested it be sent to [name] so it could be mailed to them.  [facility] pharmacist stated they could contact [facility] pharmacy, get the rx transferred, fill it and mail it to him.   They understand that will help the pt, willing to do this.   Theodore Demark, PA-C 03/16/2023 12:24 PM

## 2023-03-17 ENCOUNTER — Other Ambulatory Visit (HOSPITAL_COMMUNITY): Payer: Self-pay

## 2023-03-20 ENCOUNTER — Other Ambulatory Visit (HOSPITAL_COMMUNITY): Payer: Self-pay

## 2023-03-21 ENCOUNTER — Other Ambulatory Visit (HOSPITAL_COMMUNITY): Payer: Self-pay

## 2023-03-22 ENCOUNTER — Ambulatory Visit (INDEPENDENT_AMBULATORY_CARE_PROVIDER_SITE_OTHER): Payer: MEDICAID | Admitting: Licensed Clinical Social Worker

## 2023-03-22 ENCOUNTER — Encounter: Payer: Self-pay | Admitting: Critical Care Medicine

## 2023-03-22 ENCOUNTER — Encounter: Payer: Self-pay | Admitting: *Deleted

## 2023-03-22 ENCOUNTER — Ambulatory Visit (INDEPENDENT_AMBULATORY_CARE_PROVIDER_SITE_OTHER): Payer: MEDICAID | Admitting: Nurse Practitioner

## 2023-03-22 ENCOUNTER — Encounter: Payer: Self-pay | Admitting: Nurse Practitioner

## 2023-03-22 ENCOUNTER — Other Ambulatory Visit: Payer: Self-pay

## 2023-03-22 VITALS — BP 126/68 | HR 74 | Ht 74.0 in | Wt 392.2 lb

## 2023-03-22 DIAGNOSIS — Z6841 Body Mass Index (BMI) 40.0 and over, adult: Secondary | ICD-10-CM

## 2023-03-22 DIAGNOSIS — Z5900 Homelessness unspecified: Secondary | ICD-10-CM | POA: Diagnosis not present

## 2023-03-22 DIAGNOSIS — F411 Generalized anxiety disorder: Secondary | ICD-10-CM

## 2023-03-22 DIAGNOSIS — K5900 Constipation, unspecified: Secondary | ICD-10-CM | POA: Diagnosis not present

## 2023-03-22 DIAGNOSIS — I5032 Chronic diastolic (congestive) heart failure: Secondary | ICD-10-CM | POA: Diagnosis not present

## 2023-03-22 DIAGNOSIS — K625 Hemorrhage of anus and rectum: Secondary | ICD-10-CM

## 2023-03-22 DIAGNOSIS — F331 Major depressive disorder, recurrent, moderate: Secondary | ICD-10-CM | POA: Diagnosis not present

## 2023-03-22 MED ORDER — NA SULFATE-K SULFATE-MG SULF 17.5-3.13-1.6 GM/177ML PO SOLN
1.0000 | Freq: Once | ORAL | 0 refills | Status: AC
  Filled 2023-03-22: qty 354, 1d supply, fill #0

## 2023-03-22 NOTE — Progress Notes (Signed)
THERAPIST PROGRESS NOTE  Virtual Visit via Video Note  I connected with [name] on 03/22/23 at  3:00 PM EST by a video enabled telemedicine application and verified that I am speaking with the correct person using two identifiers.  Location: Patient: [facility]  Provider: Provider Home    I discussed the limitations of evaluation and management by telemedicine and the availability of in person appointments. The patient expressed understanding and agreed to proceed.    I discussed the assessment and treatment plan with the patient. The patient was provided an opportunity to ask questions and all were answered. The patient agreed with the plan and demonstrated an understanding of the instructions.   The patient was advised to call back or seek an in-person evaluation if the symptoms worsen or if the condition fails to improve as anticipated.  I provided 45 minutes of non-face-to-face time during this encounter.   Weber Cooks, LCSW  Participation Level: Active  Behavioral Response: CasualAlertAnxious and Depressed  Type of Therapy: Individual Therapy  Treatment Goals addressed:  Active     Anxiety     LTG: Gaberiel will score less than 5 on the Generalized Anxiety Disorder 7 Scale (GAD-7)  (Completed/Met)     Start:  05/11/22    Expected End:  10/28/22    Resolved:  08/17/22    Goal Note     Scpre taken today and last session scored below a 5 each time          STG: Bashawn will complete at least 80% of assigned homework  (Progressing)     Start:  05/11/22    Expected End:  03/31/23         STG: [name] will practice problem solving skills 3 times per week for the next 4 weeks.  (Progressing)     Start:  05/11/22    Expected End:  03/31/23         identify 3 trigger for anxiety  (Progressing)     Start:  05/11/22    Expected End:  03/31/23         create  3 coping skills for depression  (Progressing)     Start:  05/11/22    Expected End:   03/31/23         Discuss risks and benefits of medication treatment options for this problem and prescribe as indicated     Start:  05/11/22         Encourage Armas to take psychotropic medication(s) as prescribed     Start:  05/11/22         Work with [name] to identify a minimum of 3 alternative coping behaviors to avoidance.      Start:  05/11/22         Create a weekly activity schedule     Start:  05/11/22         Review results of GAD-7 with [name] to track progress (Completed)     Start:  05/11/22    End:  06/28/22    Intervention Note     Reviewed with patient during session.          Work with [name] to track symptoms, triggers, and/or skill use through a mood chart, diary card, or journal (Completed)     Start:  05/11/22    End:  06/07/22      Perform psychoeducation regarding anxiety disorders (Completed)     Start:  05/11/22    End:  06/07/22      Provide Arturo with educational information and reading material on anxiety, its causes, and symptoms.  (Completed)     Start:  05/11/22    End:  06/07/22      Work with patient individually to identify the major components of a recent episode of anxiety: physical symptoms, major thoughts and images, and major behaviors they experienced (Completed)     Start:  05/11/22    End:  06/07/22      Work with [name] to identify 3 personal goals for managing their anxiety to work on during current treatment.  (Completed)     Start:  05/11/22    End:  06/28/22      Work with [name] to identify a minimum of 3 consequences of avoidance.  (Completed)     Start:  05/11/22    End:  06/28/22        OP Depression     LTG: Reduce frequency, intensity, and duration of depression symptoms so that daily functioning is improved (Progressing)     Start:  05/11/22    Expected End:  03/31/23         LTG: Increase coping skills to manage depression and improve ability to perform daily activities (Progressing)      Start:  05/11/22    Expected End:  03/31/23         LTG: [name] will score less than 10 on the Patient Health Questionnaire (PHQ-9)  (Completed/Met)     Start:  05/11/22    Expected End:  10/28/22    Resolved:  08/17/22    Goal Note     Scpre taken today and last session scored below a 5 each time          Work with [name] to track symptoms, triggers, and/or skill use through a mood chart, diary card, or journal (Completed)     Start:  05/11/22    End:  06/07/22      Therapist will administer the PHQ-9 at weekly intervals for the next 8 weeks (Completed)     Start:  05/11/22    End:  06/28/22      Work with [name] to identify the major components of a recent episode of depression: physical symptoms, major thoughts and images, and major behaviors they experienced (Completed)     Start:  05/11/22    End:  06/07/22         ProgressTowards Goals: Progressing  Interventions: CBT and Motivational Interviewing   Suicidal/Homicidal: Nowithout intent/plan  Therapist Response:   Patient was alert and oriented x 5.  He was pleasant, cooperative, maintained good eye contact.  He engaged well in therapy session was dressed casually.  Patient reports today that he struggled through the holiday season due to having a birthday, anniversary of his father's death, and continuing to be homeless.  He reports that he continues to advocate for himself through groups, individual therapy and taking medication management from [facility].  Patient states that he still has tension, worry and irritability.  Seananthony attributes this to the fact that there are multiple people coming into the shelter due to the extreme cold time [place] is experiencing.    Intervention/plan: LCSW utilized psycho analytic therapy for patient to express thoughts, feelings and emotions in session utilizing nonjudgmental stance.  LCSW spoke with patient about triggers for irritability and  anxiety such as homelessness and grief and loss.  LCSW utilized supportive therapy for praise  and encouragement.   Plan: Return again in 3 weeks.  Diagnosis: MDD (major depressive disorder), recurrent episode, moderate (HCC)  GAD (generalized anxiety disorder)  Collaboration of Care: Other None today   Patient/Guardian was advised Release of Information must be obtained prior to any record release in order to collaborate their care with an outside provider. Patient/Guardian was advised if they have not already done so to contact the registration department to sign all necessary forms in order for [place] to release information regarding their care.   Consent: Patient/Guardian gives verbal consent for treatment and assignment of benefits for services provided during this visit. Patient/Guardian expressed understanding and agreed to proceed.   Weber Cooks, LCSW 03/22/2023

## 2023-03-22 NOTE — Progress Notes (Addendum)
ASSESSMENT    Brief Narrative:  48 y.o.  male known to Dr. Lavon Paganini with a past medical history not limited morbid obesity, cecal perforation in 2022, s/p exploratory laparotomy with primary repair , HTN, chronic diastolic heart failure, OSA on CPAP,  OA  See PMH below for any additional history.   Minor painless rectal bleeding, mainly associated with straining during BM.  Probably hemorrhoidal bleeding but need to rule out other etiologies.   Constipation with straining  Chronic diastolic dysfunction.  Echo April 2024 was technically difficult study but showed Grade I diastolic dysfunction, EF 60-65%  Homelessness  PLAN   --Treat constipation. He is homeless with limited resources but will try to get some Metamucil to take daily as well as 1-2 stool softeners at night --High fiber diet given.  --Schedule for a colonoscopy for evaluation of rectal bleeding. The risks and benefits of colonoscopy with possible polypectomy / biopsies were discussed and the patient agrees to proceed. Procedure will need to be done at Hospital due to BMI.  He feels confident that he can find someone to be care partner for procedure  --Two day bowel prep for colonoscopy.   HPI   Chief complaint : blood in stool  Brief GI History:  Christian [name] was seen in the office 04/13/21 for colon cancer screening, please refer to that note for details.  In summary, the year prior he had been [facility] with a cecal perforation and underwent exploratory laparotomy with repair of the perforation ( ? Cause).  We didn't feel patient was not an appropriate candidate for screening colonoscopy based on multiple comorbidities. He has not been seen here or at any other GI office since  Interval History: Christian [name] has been having rectal bleeding with bowel movements over last several weeks. Marland Kitchen He tends to see the blood when straining. No associated rectal or abdominal pain. He was in the ED earlier this month with N/V/D.  told he probably had Norovirus. WBC was elevated. Hgb normal. Symptoms resolved. He stays in a homeless shelter. He has a sister in [place] but they do not keep in touch.  Also has a sibling in the [place] but not in touch with them either.  Both parents are deceased   GI History / Pertinent GI Studies   **All endoscopic studies may not be included here    Echo April 2024 IMPRESSIONS Left ventricular ejection fraction, by estimation, is 60 to 65%. Left ventricular ejection fraction by 3D volume is 61 %. The left ventricle has normal function. The left ventricle has no regional wall motion abnormalities. Left ventricular diastolic parameters are consistent with Grade I diastolic dysfunction (impaired relaxation). 1. Right ventricular systolic function is normal. The right ventricular size is mildly enlarged. Tricuspid regurgitation signal is inadequate for assessing PA pressure. 2. The mitral valve is normal in structure. No evidence of mitral valve regurgitation. No evidence of mitral stenosis. 3. The aortic valve is normal in structure. Aortic valve regurgitation is not visualized. No aortic stenosis is present.      Latest Ref Rng & Units 03/02/2023    2:28 AM 11/23/2022    7:05 AM 11/22/2022   10:04 AM  Hepatic Function  Total Protein 6.5 - 8.1 g/dL 8.1  7.1  7.2   Albumin 3.5 - 5.0 g/dL 4.4  3.5  3.3   AST 15 - 41 U/L 22  16  15    ALT 0 - 44 U/L 17  18  17  Alk Phosphatase 38 - 126 U/L 90  68  68   Total Bilirubin 0.0 - 1.2 mg/dL 0.7  0.7  1.0        Latest Ref Rng & Units 03/02/2023    2:28 AM 11/27/2022    6:57 AM 11/26/2022    7:28 AM  CBC  WBC 4.0 - 10.5 K/uL 15.7  7.7  8.0   Hemoglobin 13.0 - 17.0 g/dL 16.1  09.6  04.5   Hematocrit 39.0 - 52.0 % 51.0  41.3  42.0   Platelets 150 - 400 K/uL 307  354  341      Past Medical History:  Diagnosis Date   Allergic rhinitis    Anxiety    CHF (congestive heart failure) (HCC)    Depression    Depression    Elevated blood  pressure reading without diagnosis of hypertension    History of chicken pox    Obesity    Pleural effusion    Sepsis (HCC)    Sleep apnea    O2 at night    Past Surgical History:  Procedure Laterality Date   COLON SURGERY     perforated bowel   TONSILLECTOMY AND ADENOIDECTOMY  03/01/1983    Family History  Problem Relation Age of Onset   Stroke Mother    Diabetes Mother        pre-diabetes   Heart disease Mother        CHF, pacer   Cancer Mother        lymphoma   Stomach cancer Mother    Clotting disorder Mother    Coronary artery disease Father 41       MI   Hypertension Father    Hyperlipidemia Father    Diabetes Father    Heart attack Father    Migraines Sister    Stroke Paternal Grandmother    Diabetes Paternal Grandmother    Heart disease Paternal Grandfather    Parkinson's disease Paternal Grandfather    Heart attack Paternal Grandfather     Current Medications, Allergies, Family History and Social History were reviewed in [employer] electronic medical record.     Current Outpatient Medications  Medication Sig Dispense Refill   albuterol (VENTOLIN HFA) 108 (90 Base) MCG/ACT inhaler Inhale 2 puffs into the lungs every 6 (six) hours as needed for wheezing or shortness of breath. 18 g 3   fluticasone (FLONASE) 50 MCG/ACT nasal spray Spray 2 sprays into both nostrils once daily. (Patient taking differently: Place 2 sprays into both nostrils daily as needed for allergies.) 16 g 2   fluticasone-salmeterol (ADVAIR HFA) 115-21 MCG/ACT inhaler Inhale 2 puffs into the lungs 2 (two) times daily. 12 g 12   meloxicam (MOBIC) 15 MG tablet Take 1 tablet (15 mg total) by mouth daily. 30 tablet 2   potassium chloride SA (KLOR-CON M) 20 MEQ tablet Take 1 tablet (20 mEq total) by mouth daily for 3 days, THEN 1 tablet (20 mEq total) as directed. 30 tablet 1   sertraline (ZOLOFT) 100 MG tablet Take 1.5 tablets (150 mg total) by mouth at bedtime. 45 tablet 2   torsemide  (DEMADEX) 20 MG tablet Take 2 tablets (40 mg total) by mouth 2 (two) times daily. 120 tablet 2   traZODone (DESYREL) 50 MG tablet Take 1 tablet (50 mg total) by mouth at bedtime. 30 tablet 2   Current Facility-Administered Medications  Medication Dose Route Frequency Provider Last Rate Last Admin   cholecalciferol (VITAMIN D3) tablet  1,000 Units  1,000 Units Oral Daily Regalado, Belkys A, MD        Review of Systems: No chest pain. Mild shob when moving about. No unexplained weight loss.   Physical Exam  Filed Weights   03/22/23 0909  Weight: (!) 392 lb 3.2 oz (177.9 kg)   Wt Readings from Last 3 Encounters:  03/22/23 (!) 392 lb 3.2 oz (177.9 kg)  03/16/23 (!) 402 lb 9.6 oz (182.6 kg)  03/13/23 (!) 394 lb 8 oz (178.9 kg)    BP 126/68   Pulse 74   Ht 6\' 2"  (1.88 m)   Wt (!) 392 lb 3.2 oz (177.9 kg)   SpO2 96%   BMI 50.36 kg/m  Constitutional:  Pleasant, morbidly obese male in no acute distress. Psychiatric: Normal mood and affect. Behavior is normal. EENT: Pupils normal.  Conjunctivae are normal. No scleral icterus. Neck supple.  Cardiovascular: Normal rate, regular rhythm.  Pulmonary/chest: Effort normal and breath sounds normal. No wheezing, rales or rhonchi. Abdominal: Soft, nondistended, nontender. Bowel sounds active throughout. There are no masses palpable. No hepatomegaly. Neurological: Alert and oriented to person place and time.   Willette Cluster, NP  03/22/2023, 9:19 AM  Cc:  [name], MD

## 2023-03-22 NOTE — Congregational Nurse Program (Signed)
  Dept: [phone]   Congregational Nurse Program Note  Date of Encounter: 03/22/2023  Past Medical History: Past Medical History:  Diagnosis Date   Allergic rhinitis    Anxiety    CHF (congestive heart failure) (HCC)    Depression    Depression    Elevated blood pressure reading without diagnosis of hypertension    History of chicken pox    Obesity    Pleural effusion    Sepsis (HCC)    Sleep apnea    O2 at night    Encounter Details:  Community Questionnaire - 03/22/23 1106       Questionnaire   Ask client: Do you give verbal consent for me to treat you today? Yes    Student Assistance N/A    Location Patient Served  [facility]    Encounter Setting CN site    Population Status Unhoused    Insurance Medicaid    Insurance/Financial Assistance Referral N/A    Medication N/A    Medical Provider Yes    Screening Referrals Made N/A    Medical Referrals Made [facility]    Medical Appointment Completed [facility]    CNP Interventions Advocate/Support    Screenings CN Performed Blood Pressure    ED Visit Averted N/A    Life-Saving Intervention Made N/A            Client came to nurse's office for wt and vitals check. Blood pressure (!) 148/87, pulse 81, height 6\' 2"  (1.88 m), weight (!) 391 lb 9.6 oz (177.6 kg). Client recently started taking Potassium. Completed triage form to see Dr Delford Field at [facility] clinic this afternoon. Client wants to discuss recent medication changes with [facility] MD. Nira Conn RN CN

## 2023-03-22 NOTE — Patient Instructions (Addendum)
Your colonoscopy has been scheduled at [facility] on 04/24/2023 at 8 am, separate instructions have been given  Use a stool softener 1-2 every night at bedtime  Due to recent changes in healthcare laws, you may see the results of your imaging and laboratory studies on MyChart before your provider has had a chance to review them.  We understand that in some cases there may be results that are confusing or concerning to you. Not all laboratory results come back in the same time frame and the provider may be waiting for multiple results in order to interpret others.  Please give [place] 48 hours in order for your provider to thoroughly review all the results before contacting the office for clarification of your results.    _______________________________________________________  If your blood pressure at your visit was 140/90 or greater, please contact your primary care physician to follow up on this.  _______________________________________________________  If you are age 67 or older, your body mass index should be between 23-30. Your Body mass index is 50.36 kg/m. If this is out of the aforementioned range listed, please consider follow up with your Primary Care Provider.  If you are age 75 or younger, your body mass index should be between 19-25. Your Body mass index is 50.36 kg/m. If this is out of the aformentioned range listed, please consider follow up with your Primary Care Provider.   ________________________________________________________  The [place] [facility] providers would like to encourage you to use [facility] to communicate with providers for non-urgent requests or questions.  Due to long hold times on the telephone, sending your provider a message by [facility] may be a faster and more efficient way to get a response.  Please allow 48 business hours for a response.  Please remember that this is for non-urgent requests.  _______________________________________________________   I appreciate the   opportunity to care for you  Thank You   Midge Minium

## 2023-03-23 NOTE — Progress Notes (Signed)
This patient seen at the shelter clinic needing assessment of his labs we do not do the metabolic panels here he has had potassium added to his torsemide dose meloxicam held he is now back on the meloxicam and needs a potassium check to see if potassium is to be continued I have messaged his primary care provider to achieve this

## 2023-03-29 ENCOUNTER — Other Ambulatory Visit (HOSPITAL_COMMUNITY): Payer: Self-pay

## 2023-04-06 ENCOUNTER — Ambulatory Visit: Payer: MEDICAID | Admitting: Pulmonary Disease

## 2023-04-13 ENCOUNTER — Other Ambulatory Visit: Payer: Self-pay

## 2023-04-14 ENCOUNTER — Other Ambulatory Visit: Payer: Self-pay

## 2023-04-14 ENCOUNTER — Other Ambulatory Visit (HOSPITAL_COMMUNITY): Payer: Self-pay

## 2023-04-14 ENCOUNTER — Ambulatory Visit (INDEPENDENT_AMBULATORY_CARE_PROVIDER_SITE_OTHER): Payer: MEDICAID | Admitting: Physician Assistant

## 2023-04-14 DIAGNOSIS — F411 Generalized anxiety disorder: Secondary | ICD-10-CM | POA: Diagnosis not present

## 2023-04-14 DIAGNOSIS — F331 Major depressive disorder, recurrent, moderate: Secondary | ICD-10-CM | POA: Diagnosis not present

## 2023-04-14 MED ORDER — TRAZODONE HCL 50 MG PO TABS
50.0000 mg | ORAL_TABLET | Freq: Every day | ORAL | 2 refills | Status: DC
  Filled 2023-04-14: qty 30, 30d supply, fill #0

## 2023-04-14 MED ORDER — SERTRALINE HCL 100 MG PO TABS
150.0000 mg | ORAL_TABLET | Freq: Every day | ORAL | 2 refills | Status: DC
  Filled 2023-04-14 – 2023-05-16 (×2): qty 45, 30d supply, fill #0

## 2023-04-14 NOTE — Progress Notes (Unsigned)
 BH MD/PA/NP OP Progress Note  04/14/2023 9:55 AM Christian [name]  MRN:  [id number]  Chief Complaint:  No chief complaint on file.  HPI:   Christian [name] is a 48 year old, Caucasian male with a past psychiatric history significant for anxiety and major depressive disorder who presents to [facility] for follow-up and medication management.  Patient is currently being managed on the following psychiatric medication:   Sertraline 150 mg daily. Trazodone 50 mg daily  ***  Patient is alert and oriented x 4, calm, cooperative, and fully engaged in conversation during the encounter. ***  Visit Diagnosis:  No diagnosis found.   Past Psychiatric History:  Patient reports that when he was first hospitalized due to mental health, he was diagnosed with anxiety and depression. Patient states that he was started on antidepressants during that time but never stayed consistent with his medication management   Past Medical History:  Past Medical History:  Diagnosis Date  . Allergic rhinitis   . Anxiety   . CHF (congestive heart failure) (HCC)   . Depression   . Depression   . Elevated blood pressure reading without diagnosis of hypertension   . History of chicken pox   . Obesity   . Pleural effusion   . Sepsis (HCC)   . Sleep apnea    O2 at night    Past Surgical History:  Procedure Laterality Date  . COLON SURGERY     perforated bowel  . TONSILLECTOMY AND ADENOIDECTOMY  03/01/1983    Family Psychiatric History:  Mother - patient reports that his mother had cancer when he was young and she was referred to a mental health facility Uncle (paternal) - possible schizophrenia, patient reports that his family suspected that his uncle may have been schizophrenic because he would see things   Family history of suicide: Patient denies, but states that his mother's, who was adopted, cousin attempted suicide.  Patient states that they were not blood  related. Family history of homicide: Patient denies Family history of substance abuse: Patient denies  Family History:  Family History  Problem Relation Age of Onset  . Stroke Mother   . Diabetes Mother        pre-diabetes  . Heart disease Mother        CHF, pacer  . Cancer Mother        lymphoma  . Stomach cancer Mother   . Clotting disorder Mother   . Coronary artery disease Father 31       MI  . Hypertension Father   . Hyperlipidemia Father   . Diabetes Father   . Heart attack Father   . Migraines Sister   . Stroke Paternal Grandmother   . Diabetes Paternal Grandmother   . Heart disease Paternal Grandfather   . Parkinson's disease Paternal Grandfather   . Heart attack Paternal Grandfather     Social History:  Social History   Socioeconomic History  . Marital status: Single    Spouse name: Not on file  . Number of children: 0  . Years of education: Not on file  . Highest education level: Some college, no degree  Occupational History  . Not on file  Tobacco Use  . Smoking status: Never  . Smokeless tobacco: Never  . Tobacco comments:    Living in homeless shelter  Vaping Use  . Vaping status: Never Used  Substance and Sexual Activity  . Alcohol use: Yes    Alcohol/week: 2.0  standard drinks of alcohol    Types: 2 Cans of beer per week    Comment: 1-2 beers 1-2 days a week  . Drug use: Never  . Sexual activity: Not Currently  Other Topics Concern  . Not on file  Social History Narrative   Caffeine: occasional, lives at a shelter ([address])  Occupation: works at [employer] part timeEdu: some college Diet: some water, some fruits/vegetables, 1-2x/wk red meat, fish 1x/wk   Social Drivers of Corporate investment banker Strain: High Risk (03/06/2023)   Overall Financial Resource Strain (CARDIA)   . Difficulty of Paying Living Expenses: Very hard  Food Insecurity: Food Insecurity Present (03/06/2023)   Hunger Vital Sign   . Worried About Brewing technologist in the Last Year: Sometimes true   . Ran Out of Food in the Last Year: Often true  Transportation Needs: Unmet Transportation Needs (03/06/2023)   PRAPARE - Transportation   . Lack of Transportation (Medical): Yes   . Lack of Transportation (Non-Medical): Yes  Physical Activity: Inactive (03/06/2023)   Exercise Vital Sign   . Days of Exercise per Week: 0 days   . Minutes of Exercise per Session: 20 min  Stress: Stress Concern Present (03/06/2023)   [employer] of Occupational Health - Occupational Stress Questionnaire   . Feeling of Stress : To some extent  Social Connections: Moderately Integrated (03/06/2023)   Social Connection and Isolation Panel [NHANES]   . Frequency of Communication with Friends and Family: More than three times a week   . Frequency of Social Gatherings with Friends and Family: Twice a week   . Attends Religious Services: More than 4 times per year   . Active Member of Clubs or Organizations: Yes   . Attends Banker Meetings: More than 4 times per year   . Marital Status: Never married    Allergies:  Allergies  Allergen Reactions  . Shellfish Allergy Nausea Only    Metabolic Disorder Labs: Lab Results  Component Value Date   HGBA1C 5.8 (H) 11/19/2022   MPG 119.76 11/19/2022   MPG 125.5 10/09/2020   No results found for: "PROLACTIN" Lab Results  Component Value Date   CHOL 153 12/24/2020   TRIG 126 12/24/2020   HDL 42 12/24/2020   CHOLHDL 3.6 12/24/2020   LDLCALC 88 12/24/2020   Lab Results  Component Value Date   TSH 2.290 12/24/2020   TSH 2.650 10/09/2020    Therapeutic Level Labs: No results found for: "LITHIUM" No results found for: "VALPROATE" No results found for: "CBMZ"  Current Medications: Current Outpatient Medications  Medication Sig Dispense Refill  . albuterol (VENTOLIN HFA) 108 (90 Base) MCG/ACT inhaler Inhale 2 puffs into the lungs every 6 (six) hours as needed for wheezing or shortness of breath. 18 g 3   . fluticasone (FLONASE) 50 MCG/ACT nasal spray Spray 2 sprays into both nostrils once daily. (Patient taking differently: Place 2 sprays into both nostrils daily as needed for allergies.) 16 g 2  . fluticasone-salmeterol (ADVAIR HFA) 115-21 MCG/ACT inhaler Inhale 2 puffs into the lungs 2 (two) times daily. 12 g 12  . meloxicam (MOBIC) 15 MG tablet Take 1 tablet (15 mg total) by mouth daily. 30 tablet 2  . potassium chloride SA (KLOR-CON M) 20 MEQ tablet Take 1 tablet (20 mEq total) by mouth daily for 3 days, THEN 1 tablet (20 mEq total) as directed. 30 tablet 1  . sertraline (ZOLOFT) 100 MG tablet Take 1.5  tablets (150 mg total) by mouth at bedtime. 45 tablet 2  . torsemide (DEMADEX) 20 MG tablet Take 2 tablets (40 mg total) by mouth 2 (two) times daily. 120 tablet 2  . traZODone (DESYREL) 50 MG tablet Take 1 tablet (50 mg total) by mouth at bedtime. 30 tablet 2   Current Facility-Administered Medications  Medication Dose Route Frequency Provider Last Rate Last Admin  . cholecalciferol (VITAMIN D3) tablet 1,000 Units  1,000 Units Oral Daily Regalado, Belkys A, MD         Musculoskeletal: Strength & Muscle Tone: within normal limits Gait & Station:  Patient ambulates with a cane Patient leans: N/A  Psychiatric Specialty Exam: Review of Systems  Psychiatric/Behavioral:  Positive for sleep disturbance. Negative for decreased concentration, dysphoric mood, hallucinations, self-injury and suicidal ideas. The patient is nervous/anxious. The patient is not hyperactive.     There were no vitals taken for this visit.There is no height or weight on file to calculate BMI.  General Appearance: Casual  Eye Contact:  Good  Speech:  Clear and Coherent and Normal Rate  Volume:  Normal  Mood:  Anxious and Depressed  Affect:  Appropriate  Thought Process:  Coherent, Goal Directed, and Descriptions of Associations: Intact  Orientation:  Full (Time, Place, and Person)  Thought Content: WDL   Suicidal  Thoughts:  No  Homicidal Thoughts:  No  Memory:  Immediate;   Good Recent;   Good Remote;   Fair  Judgement:  Fair  Insight:  Fair  Psychomotor Activity:  Normal  Concentration:  Concentration: Good and Attention Span: Good  Recall:  Good  Fund of Knowledge: Fair  Language: Good  Akathisia:  No  Handed:  Left  AIMS (if indicated): not done  Assets:  Communication Skills Desire for Improvement Housing Social Support  ADL's:  Intact  Cognition: WNL  Sleep:   Patient has a history of sleep apnea. Patient reports receiving on average 8 hours of sleep per night     Screenings: AUDIT    Flowsheet Row Office Visit from 07/05/2022 in [facility] at [facility]  Alcohol Use Disorder Identification Test Final Score (AUDIT) 3      GAD-7    Flowsheet Row Clinical Support from 02/17/2023 in [facility] Clinical Support from 12/16/2022 in [facility] Office Visit from 12/09/2022 in [facility] at [facility] Counselor from 11/29/2022 in [facility] Video Visit from 10/14/2022 in [facility]  Total GAD-7 Score 13 11 5 9 8       PHQ2-9    Flowsheet Row Office Visit from 03/13/2023 in [facility] at [facility] Clinical Support from 02/17/2023 in [facility] Nutrition from 12/22/2022 in [facility] Clinical Support from 12/16/2022 in [facility] Office Visit from 12/09/2022 in [facility] Care at [facility] Total Score 1 1 1 1 2   PHQ-9 Total Score 5 -- -- -- 5      Flowsheet Row ED from 03/02/2023 in [facility] Emergency Department at [facility] Clinical Support from 02/17/2023 in [facility] Clinical Support from 12/16/2022 in [facility]  C-SSRS RISK CATEGORY No Risk No Risk No Risk        Assessment and Plan:   Christian Chrystal.  [name] is a 48 year old, Caucasian male with a past psychiatric history significant for anxiety and major depressive disorder who presents to [facility] via virtual video visit for follow-up and medication management. ***  Collaboration of Care: Collaboration of Care: Medication Management AEB provider managing patient's psychiatric medications, Psychiatrist AEB patient being seen by mental health provider at this facility, and Referral or follow-up with counselor/therapist AEB patient being seen by a licensed clinical social worker at this facility  Patient/Guardian was advised Release of Information must be obtained prior to any record release in order to collaborate their care with an outside provider. Patient/Guardian was advised if they have not already done so to contact the registration department to sign all necessary forms in order for [place] to release information regarding their care.   Consent: Patient/Guardian gives verbal consent for treatment and assignment of benefits for services provided during this visit. Patient/Guardian expressed understanding and agreed to proceed.   1. MDD (major depressive disorder), recurrent episode, moderate (HCC)  - traZODone (DESYREL) 50 MG tablet; Take 1 tablet (50 mg total) by mouth at bedtime.  Dispense: 30 tablet; Refill: 2 - sertraline (ZOLOFT) 100 MG tablet; Take 1.5 tablets (150 mg total) by mouth at bedtime.  Dispense: 45 tablet; Refill: 2  2. GAD (generalized anxiety disorder)  - sertraline (ZOLOFT) 100 MG tablet; Take 1.5 tablets (150 mg total) by mouth at bedtime.  Dispense: 45 tablet; Refill: 2  Patient to follow-up in 2 months Provider spent a total of 18 minutes with the patient/reviewing patient's chart  Meta Hatchet, PA 04/14/2023, 9:55 AM

## 2023-04-17 ENCOUNTER — Encounter (HOSPITAL_COMMUNITY): Payer: Self-pay | Admitting: Gastroenterology

## 2023-04-17 ENCOUNTER — Telehealth: Payer: Self-pay

## 2023-04-17 ENCOUNTER — Encounter (HOSPITAL_COMMUNITY): Payer: Self-pay | Admitting: Physician Assistant

## 2023-04-17 NOTE — Telephone Encounter (Signed)
 Procedure colon Procedure date 04/24/23 Procedure location [facility] Arrival Time 6:30 Spoke with the patient

## 2023-04-18 NOTE — Telephone Encounter (Signed)
 Unfortunately not sure what we could do to help this patient, he could check with his homeless shelter if they will permit him to stay and do the bowel prep

## 2023-04-18 NOTE — Telephone Encounter (Signed)
 Spoke with patient and there is a doctor that comes into the shelter tomorrow and he wants to talk to him about the prep. I told him to contact our office if he decides he is unable to do the prep as soon as he knows so I can cancel the hospital procedure.

## 2023-04-18 NOTE — Telephone Encounter (Addendum)
 Patient was contacted for hospital call.  He reports  he is living in a homeless shelter  and he is not sure if he will be able to prep. He is schedule for hospital colonoscopy  on 04/24/23 at [facility].  Patient confirmed that he does have his prep and a driver, just no where to prep.    Patient was notified that the office will contact him back once discussed with the provider.

## 2023-04-19 ENCOUNTER — Encounter: Payer: Self-pay | Admitting: *Deleted

## 2023-04-19 NOTE — Telephone Encounter (Signed)
 Inbound call from patient, states he spoke with doctor at the homeless shelter and would advised not to have procedure done. Patient would like to cancel procedure at the hospital.

## 2023-04-19 NOTE — Telephone Encounter (Signed)
 Dr Lavon Paganini the patient did call and cancel, so I call [facility] and cancelled him

## 2023-04-19 NOTE — Congregational Nurse Program (Signed)
  Dept: [phone]   Congregational Nurse Program Note  Date of Encounter: 04/19/2023  Past Medical History: Past Medical History:  Diagnosis Date   Allergic rhinitis    Anxiety    CHF (congestive heart failure) (HCC)    Depression    Depression    Elevated blood pressure reading without diagnosis of hypertension    History of chicken pox    Obesity    Pleural effusion    Sepsis (HCC)    Sleep apnea    O2 at night    Encounter Details:  Community Questionnaire - 04/19/23 1300       Questionnaire   Ask client: Do you give verbal consent for me to treat you today? Yes    Student Assistance N/A    Location Patient Served  [facility]    Encounter Setting CN site;Phone/Text/Email    Population Status Unhoused    Insurance [employer]    Insurance/Financial Assistance Referral N/A    Medication N/A    Medical Provider Yes    Screening Referrals Made N/A    Medical Referrals Made N/A    Medical Appointment Completed N/A    CNP Interventions Advocate/Support    Screenings CN Performed N/A    ED Visit Averted N/A    Life-Saving Intervention Made N/A            Client seen in [facility] lobby and asked writer about getting him a hotel room to prepare for upcoming colonoscopy. Contacted Dr Delford Field to inquire about funds for hotel room. Per call, client must have someone to transport him to and from procedure. He must also have someone to stay with him 24 hours after procedure. If not the procedure would need to be completed at hospital with 24 hour stay. Explained information to client and he acknowledges understanding saying he would contact his gastroenterology office. Harwood Nall W RN CN

## 2023-04-21 NOTE — Telephone Encounter (Signed)
 ok

## 2023-04-24 ENCOUNTER — Encounter (HOSPITAL_COMMUNITY): Payer: Self-pay

## 2023-04-24 ENCOUNTER — Ambulatory Visit (HOSPITAL_COMMUNITY): Admit: 2023-04-24 | Payer: MEDICAID | Admitting: Gastroenterology

## 2023-04-24 SURGERY — COLONOSCOPY WITH PROPOFOL
Anesthesia: Monitor Anesthesia Care

## 2023-04-26 ENCOUNTER — Ambulatory Visit
Admission: RE | Admit: 2023-04-26 | Discharge: 2023-04-26 | Disposition: A | Discharge status: Home / Self Care | Payer: MEDICAID | Source: Ambulatory Visit | Attending: Adult Health | Admitting: Adult Health

## 2023-04-26 DIAGNOSIS — R918 Other nonspecific abnormal finding of lung field: Secondary | ICD-10-CM

## 2023-05-02 ENCOUNTER — Ambulatory Visit (HOSPITAL_COMMUNITY): Payer: MEDICAID | Admitting: Licensed Clinical Social Worker

## 2023-05-02 DIAGNOSIS — F331 Major depressive disorder, recurrent, moderate: Secondary | ICD-10-CM

## 2023-05-02 DIAGNOSIS — F411 Generalized anxiety disorder: Secondary | ICD-10-CM

## 2023-05-02 NOTE — Progress Notes (Signed)
 THERAPIST PROGRESS NOTE  Session Time: 77   Participation Level: Active  Behavioral Response: CasualAlertAnxious and Depressed  Type of Therapy: Individual Therapy  Treatment Goals addressed:  Active     Anxiety     LTG: [name] will score less than 5 on the Generalized Anxiety Disorder 7 Scale (GAD-7)  (Completed/Met)     Start:  05/11/22    Expected End:  10/28/22    Resolved:  08/17/22    Goal Note     Scpre taken today and last session scored below a 5 each time          STG: [name] will complete at least 80% of assigned homework  (Progressing)     Start:  05/11/22    Expected End:  09/01/23         STG: [name] will practice problem solving skills 3 times per week for the next 4 weeks.  (Progressing)     Start:  05/11/22    Expected End:  09/01/23         identify 3 trigger for anxiety  (Progressing)     Start:  05/11/22    Expected End:  09/01/23         create  3 coping skills for depression  (Progressing)     Start:  05/11/22    Expected End:  09/01/23         Discuss risks and benefits of medication treatment options for this problem and prescribe as indicated     Start:  05/11/22         Encourage Orell to take psychotropic medication(s) as prescribed     Start:  05/11/22         Work with [name] to identify a minimum of 3 alternative coping behaviors to avoidance.      Start:  05/11/22         Create a weekly activity schedule     Start:  05/11/22         Review results of GAD-7 with [name] to track progress (Completed)     Start:  05/11/22    End:  06/28/22    Intervention Note     Reviewed with patient during session.          Work with [name] to track symptoms, triggers, and/or skill use through a mood chart, diary card, or journal (Completed)     Start:  05/11/22    End:  06/07/22      Perform psychoeducation regarding anxiety disorders (Completed)     Start:  05/11/22    End:  06/07/22      Provide [name] with  educational information and reading material on anxiety, its causes, and symptoms.  (Completed)     Start:  05/11/22    End:  06/07/22      Work with patient individually to identify the major components of a recent episode of anxiety: physical symptoms, major thoughts and images, and major behaviors they experienced (Completed)     Start:  05/11/22    End:  06/07/22      Work with [name] to identify 3 personal goals for managing their anxiety to work on during current treatment.  (Completed)     Start:  05/11/22    End:  06/28/22      Work with [name] to identify a minimum of 3 consequences of avoidance.  (Completed)     Start:  05/11/22    End:  06/28/22  OP Depression     LTG: Reduce frequency, intensity, and duration of depression symptoms so that daily functioning is improved (Progressing)     Start:  05/11/22    Expected End:  09/01/23         LTG: Increase coping skills to manage depression and improve ability to perform daily activities (Progressing)     Start:  05/11/22    Expected End:  09/01/23         LTG: [name] will score less than 10 on the Patient Health Questionnaire (PHQ-9)  (Completed/Met)     Start:  05/11/22    Expected End:  10/28/22    Resolved:  08/17/22    Goal Note     Scpre taken today and last session scored below a 5 each time          Work with [name] to track symptoms, triggers, and/or skill use through a mood chart, diary card, or journal (Completed)     Start:  05/11/22    End:  06/07/22      Therapist will administer the PHQ-9 at weekly intervals for the next 8 weeks (Completed)     Start:  05/11/22    End:  06/28/22      Work with [name] to identify the major components of a recent episode of depression: physical symptoms, major thoughts and images, and major behaviors they experienced (Completed)     Start:  05/11/22    End:  06/07/22         ProgressTowards Goals: Progressing  Interventions: CBT, Motivational  Interviewing, and Supportive    Suicidal/Homicidal: Nowithout intent/plan  Therapist Response:    Patient was alert and oriented x 5.  Christian [name] was pleasant, cooperative, maintained good eye contact.  He engaged well in therapy session was dressed casually.  He presented with depressed and anxious mood\affect.  Patient reports primary stressors as housing and friendships.  He reports today that he is struggling with one of his relationships that he developed when he was at the [facility] in [facility].  Patient reports that she has a boyfriend who is "jealous type".  Patient reports that he has been hanging out with him and over the past week and things got very tense to the point where Christian [name] aggressively set boundaries stating "you are always complaining about shit" to the boyfriend.  Patient reports that he then got very tense to the point where [name] thought that he was going to get into a physical altercation with the boyfriend.  Patient reports that this did not happen but states that he needs to set better boundaries about staying away from the boyfriend.  Other stressors for patient are housing.  Patient reports that he remains at the shelter.  Christian [name] reports today that his time at the shelter has expired for the last 60 days.  He reports that they have not said anything to him in the case managers and other staff at the facility continue to praise him for his hard work such as applying for disability, going to support groups, and engaging in his mental health treatment.  Intervention/plan: LCSW utilized supportive therapy for praise and encouragement.  LCSW utilized psychoanalytic therapy for patient to express thoughts, feelings and emotions and nonjudgmental environment.  LCSW utilized use motivational interviewing for open-ended questions, reflective listening, and positive affirmations.  Plan: Return again in 5 weeks.  Diagnosis: MDD (major depressive disorder), recurrent  episode, moderate (HCC)  GAD (generalized anxiety disorder)  Collaboration of  Care: Other None today   Patient/Guardian was advised Release of Information must be obtained prior to any record release in order to collaborate their care with an outside provider. Patient/Guardian was advised if they have not already done so to contact the registration department to sign all necessary forms in order for [place] to release information regarding their care.   Consent: Patient/Guardian gives verbal consent for treatment and assignment of benefits for services provided during this visit. Patient/Guardian expressed understanding and agreed to proceed.   Weber Cooks, LCSW 05/02/2023

## 2023-05-04 ENCOUNTER — Encounter: Payer: Self-pay | Admitting: Family Medicine

## 2023-05-16 ENCOUNTER — Other Ambulatory Visit (HOSPITAL_COMMUNITY): Payer: Self-pay

## 2023-05-16 ENCOUNTER — Other Ambulatory Visit: Payer: Self-pay

## 2023-05-16 ENCOUNTER — Other Ambulatory Visit: Payer: Self-pay | Admitting: Family Medicine

## 2023-05-16 MED ORDER — TORSEMIDE 20 MG PO TABS
40.0000 mg | ORAL_TABLET | Freq: Two times a day (BID) | ORAL | 2 refills | Status: DC
  Filled 2023-05-16: qty 120, 30d supply, fill #0
  Filled 2023-06-13: qty 120, 30d supply, fill #1
  Filled 2023-07-11: qty 120, 30d supply, fill #2

## 2023-05-18 ENCOUNTER — Encounter: Payer: Self-pay | Admitting: Pulmonary Disease

## 2023-05-18 ENCOUNTER — Ambulatory Visit: Payer: MEDICAID | Admitting: Pulmonary Disease

## 2023-05-18 VITALS — BP 116/73 | HR 60 | Ht 74.0 in | Wt >= 6400 oz

## 2023-05-18 DIAGNOSIS — I5032 Chronic diastolic (congestive) heart failure: Secondary | ICD-10-CM

## 2023-05-18 DIAGNOSIS — J4530 Mild persistent asthma, uncomplicated: Secondary | ICD-10-CM

## 2023-05-18 DIAGNOSIS — E66813 Obesity, class 3: Secondary | ICD-10-CM

## 2023-05-18 DIAGNOSIS — R0609 Other forms of dyspnea: Secondary | ICD-10-CM

## 2023-05-18 DIAGNOSIS — J449 Chronic obstructive pulmonary disease, unspecified: Secondary | ICD-10-CM

## 2023-05-18 DIAGNOSIS — G4733 Obstructive sleep apnea (adult) (pediatric): Secondary | ICD-10-CM | POA: Diagnosis not present

## 2023-05-18 DIAGNOSIS — R918 Other nonspecific abnormal finding of lung field: Secondary | ICD-10-CM

## 2023-05-18 NOTE — Patient Instructions (Signed)
 I will see you in about 3 months  The download from the machine shows that the CPAP is working well  Continue to try to get adequate number of hours of sleep  Continue using your Advair and albuterol as needed  Graded activities/exercise as tolerated  Make a commitment to exercising regularly  Continue weight loss efforts  Call us with significant concerns

## 2023-05-18 NOTE — Progress Notes (Signed)
 Christian [name]    [id number]    Jan 11, 1976  Primary Care Physician:Wilson, Lauris Poag, MD  Referring Physician: [name], MD 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 -Only able to tolerate about 50 yards of walking -Uses a cane to ambulate  Past history of pneumonia -Most recent CT scan of the chest discussed with the patient has shown improvement and resolution of the pneumonic infiltrate -Does have nodules in the lungs comparing current CT with previous, has remained stable -No indication for further CTs  History of chronic respiratory failure   History of diastolic heart failure  Complicated course in 2022 with respiratory failure, influenza, pneumonia, sepsis, decubitus ulcer most recent hospitalization was in September 2024  Never smoked, exposure to second hand smoke because of current living situation  Overall he is doing well  Has no other significant ongoing concerns at present  He does reside in a homeless shelter   Outpatient Encounter Medications as of 05/18/2023  Medication Sig   albuterol (VENTOLIN HFA) 108 (90 Base) MCG/ACT inhaler Inhale 2 puffs into the lungs every 6 (six) hours as needed for wheezing or shortness of breath.   fluticasone (FLONASE) 50 MCG/ACT nasal spray Spray 2 sprays into both nostrils once daily. (Patient taking differently: Place 2 sprays into both nostrils daily as needed for allergies.)   fluticasone-salmeterol (ADVAIR HFA) 115-21 MCG/ACT inhaler Inhale 2 puffs into the lungs 2 (two)  times daily.   meloxicam (MOBIC) 15 MG tablet Take 1 tablet (15 mg total) by mouth daily.   sertraline (ZOLOFT) 100 MG tablet Take 1.5 tablets (150 mg total) by mouth at bedtime.   torsemide (DEMADEX) 20 MG tablet Take 2 tablets (40 mg total) by mouth 2 (two) times daily.   traZODone (DESYREL) 50 MG tablet Take 1 tablet (50 mg total) by mouth at bedtime.   potassium chloride SA (KLOR-CON M) 20 MEQ tablet Take 1 tablet (20 mEq total) by mouth daily for 3 days, THEN 1 tablet (20 mEq total) as directed.   Facility-Administered Encounter Medications as of 05/18/2023  Medication   cholecalciferol (VITAMIN D3) tablet 1,000 Units    Allergies as of 05/18/2023 - Review Complete 05/18/2023  Allergen Reaction Noted   Shellfish allergy Nausea Only 12/08/2020    Past Medical History:  Diagnosis Date   Allergic rhinitis    Anxiety    CHF (congestive heart failure) (HCC)    Depression    Depression    Elevated blood pressure reading without diagnosis of hypertension    History of chicken pox    Obesity    Pleural effusion    Sepsis (HCC)    Sleep apnea    O2 at night    Past Surgical History:  Procedure Laterality Date   COLON SURGERY     perforated bowel   TONSILLECTOMY AND ADENOIDECTOMY  03/01/1983    Family History  Problem Relation Age of Onset   Stroke Mother    Diabetes Mother        pre-diabetes  Heart disease Mother        CHF, pacer   Cancer Mother        lymphoma   Stomach cancer Mother    Clotting disorder Mother    Coronary artery disease Father 41       MI   Hypertension Father    Hyperlipidemia Father    Diabetes Father    Heart attack Father    Migraines Sister    Stroke Paternal Grandmother    Diabetes Paternal Grandmother    Heart disease Paternal Grandfather    Parkinson's disease Paternal Grandfather    Heart attack Paternal Grandfather     Social History   Socioeconomic History   Marital status: Single    Spouse name: Not on file   Number of  children: 0   Years of education: Not on file   Highest education level: Some college, no degree  Occupational History   Not on file  Tobacco Use   Smoking status: Never    Passive exposure: Current   Smokeless tobacco: Never   Tobacco comments:    Living in homeless shelter  Vaping Use   Vaping status: Never Used  Substance and Sexual Activity   Alcohol use: Yes    Alcohol/week: 2.0 standard drinks of alcohol    Types: 2 Cans of beer per week    Comment: 1-2 beers 1-2 days a week   Drug use: Never   Sexual activity: Not Currently  Other Topics Concern   Not on file  Social History Narrative   Caffeine: occasional, lives at a shelter [employer])  Occupation: works at [employer] part timeEdu: some college Diet: some water, some fruits/vegetables, 1-2x/wk red meat, fish 1x/wk   Social Drivers of Corporate investment banker Strain: High Risk (03/06/2023)   Overall Financial Resource Strain (CARDIA)    Difficulty of Paying Living Expenses: Very hard  Food Insecurity: Food Insecurity Present (03/06/2023)   Hunger Vital Sign    Worried About Running Out of Food in the Last Year: Sometimes true    Ran Out of Food in the Last Year: Often true  Transportation Needs: Unmet Transportation Needs (03/06/2023)   PRAPARE - Administrator, Civil Service (Medical): Yes    Lack of Transportation (Non-Medical): Yes  Physical Activity: Unknown (03/06/2023)   Exercise Vital Sign    Days of Exercise per Week: 0 days    Minutes of Exercise per Session: Not on file  Recent Concern: Physical Activity - Inactive (03/06/2023)   Exercise Vital Sign    Days of Exercise per Week: 0 days    Minutes of Exercise per Session: 20 min  Stress: Stress Concern Present (03/06/2023)   [employer] of Occupational Health - Occupational Stress Questionnaire    Feeling of Stress : To some extent  Social Connections: Moderately Integrated (03/06/2023)   Social Connection and Isolation Panel [NHANES]     Frequency of Communication with Friends and Family: More than three times a week    Frequency of Social Gatherings with Friends and Family: Twice a week    Attends Religious Services: More than 4 times per year    Active Member of [employer] or Organizations: Yes    Attends Banker Meetings: More than 4 times per year    Marital Status: Never married  Intimate Partner Violence: Not At Risk (03/13/2023)   Humiliation, Afraid, Rape, and Kick questionnaire    Fear of Current or Ex-Partner: No  Emotionally Abused: No    Physically Abused: No    Sexually Abused: No    Review of Systems  Constitutional:  Positive for fatigue.  Respiratory:  Positive for apnea and shortness of breath.   Psychiatric/Behavioral:  Positive for sleep disturbance.     Vitals:   05/18/23 0901  BP: 116/73  Pulse: 60  SpO2: 93%     Physical Exam Constitutional:      Appearance: He is obese.  HENT:     Head: Normocephalic.     Mouth/Throat:     Mouth: Mucous membranes are moist.     Comments: Mallampati 4, crowded oropharynx Eyes:     General: No scleral icterus. Cardiovascular:     Rate and Rhythm: Normal rate and regular rhythm.     Heart sounds: No murmur heard.    No friction rub.  Pulmonary:     Effort: No respiratory distress.     Breath sounds: No stridor. No wheezing or rhonchi.  Musculoskeletal:     Cervical back: No rigidity or tenderness.  Neurological:     Mental Status: He is alert.  Psychiatric:        Mood and Affect: Mood normal.    Data Reviewed: Most recent office visit with Allie Bossier reviewed  Most recent CT scan of the chest reviewed by myself showing multiple nodules-stable from previous  Echocardiogram results 06/22/2022 reviewed-normal ejection fraction, diastolic dysfunction  Pulmonary function test 08/29/2022-obstructive physiology  CPAP compliance reviewed showing excellent compliance CPAP of 12 Residual AHI of 1.0 -No significant mask  leaks  Assessment:  Obstructive sleep apnea -Compliant with CPAP -Download from the machine shows it is working well  Class III obesity -Encourage weight loss efforts -Encourage regular exercises  Obstructive lung disease -Continue Advair -Continue albuterol as needed  Diastolic dysfunction -Manage risk as best as possible  Musculoskeletal pain and dysfunction Chronic knee pain -This is obviously contributing to his limitations  Body habitus likely predisposes him to obesity hypoventilation syndrome  History of major depressive disorder    Plan/Recommendations: Encouraged to exercise regularly  Focus on weight loss efforts  Continue Advair Continue albuterol as needed  Continue CPAP on a nightly basis  Call us with significant concerns  Follow-up in about 3 months     Virl Diamond MD  Pulmonary and Critical Care 05/18/2023, 9:08 AM  CC: [name], MD

## 2023-06-06 ENCOUNTER — Ambulatory Visit (HOSPITAL_COMMUNITY): Payer: MEDICAID | Admitting: Licensed Clinical Social Worker

## 2023-06-06 DIAGNOSIS — F331 Major depressive disorder, recurrent, moderate: Secondary | ICD-10-CM

## 2023-06-06 DIAGNOSIS — F411 Generalized anxiety disorder: Secondary | ICD-10-CM

## 2023-06-06 NOTE — Progress Notes (Signed)
 THERAPIST PROGRESS NOTE  Session Time: 46   Participation Level: Active  Behavioral Response: CasualAlertAnxious and Depressed  Type of Therapy: Individual Therapy  Treatment Goals addressed: Active     Anxiety     LTG: Christian [name] will score less than 5 on the Generalized Anxiety Disorder 7 Scale (GAD-7)  (Completed/Met)     Start:  05/11/22    Expected End:  10/28/22    Resolved:  08/17/22    Goal Note     Scpre taken today and last session scored below a 5 each time          STG: Christian [name] will complete at least 80% of assigned homework  (Progressing)     Start:  05/11/22    Expected End:  09/01/23         STG: Christian [name] will practice problem solving skills 3 times per week for the next 4 weeks.  (Progressing)     Start:  05/11/22    Expected End:  09/01/23         identify 3 trigger for anxiety  (Progressing)     Start:  05/11/22    Expected End:  09/01/23         create  3 coping skills for depression  (Progressing)     Start:  05/11/22    Expected End:  09/01/23         Discuss risks and benefits of medication treatment options for this problem and prescribe as indicated     Start:  05/11/22         Encourage Christian [name] to take psychotropic medication(s) as prescribed     Start:  05/11/22         Work with Christian [name] to identify a minimum of 3 alternative coping behaviors to avoidance.      Start:  05/11/22         Create a weekly activity schedule     Start:  05/11/22         Review results of GAD-7 with Christian [name] to track progress (Completed)     Start:  05/11/22    End:  06/28/22    Intervention Note     Reviewed with patient during session.          Work with Christian [name] to track symptoms, triggers, and/or skill use through a mood chart, diary card, or journal (Completed)     Start:  05/11/22    End:  06/07/22      Perform psychoeducation regarding anxiety disorders (Completed)     Start:  05/11/22    End:  06/07/22      Provide Christian [name] with  educational information and reading material on anxiety, its causes, and symptoms.  (Completed)     Start:  05/11/22    End:  06/07/22      Work with patient individually to identify the major components of a recent episode of anxiety: physical symptoms, major thoughts and images, and major behaviors they experienced (Completed)     Start:  05/11/22    End:  06/07/22      Work with Christian [name] to identify 3 personal goals for managing their anxiety to work on during current treatment.  (Completed)     Start:  05/11/22    End:  06/28/22      Work with Christian [name] to identify a minimum of 3 consequences of avoidance.  (Completed)     Start:  05/11/22    End:  06/28/22  OP Depression     LTG: Reduce frequency, intensity, and duration of depression symptoms so that daily functioning is improved (Progressing)     Start:  05/11/22    Expected End:  09/01/23         LTG: Increase coping skills to manage depression and improve ability to perform daily activities (Progressing)     Start:  05/11/22    Expected End:  09/01/23         LTG: Christian [name] will score less than 10 on the Patient Health Questionnaire (PHQ-9)  (Completed/Met)     Start:  05/11/22    Expected End:  10/28/22    Resolved:  08/17/22    Goal Note     Scpre taken today and last session scored below a 5 each time          Work with Christian [name] to track symptoms, triggers, and/or skill use through a mood chart, diary card, or journal (Completed)     Start:  05/11/22    End:  06/07/22      Therapist will administer the PHQ-9 at weekly intervals for the next 8 weeks (Completed)     Start:  05/11/22    End:  06/28/22      Work with Christian [name] to identify the major components of a recent episode of depression: physical symptoms, major thoughts and images, and major behaviors they experienced (Completed)     Start:  05/11/22    End:  06/07/22        ProgressTowards Goals: Progressing  Interventions: CBT, Motivational  Interviewing, and Supportive   Suicidal/Homicidal: Nowithout intent/plan  Therapist Response:   Christian [name] was alert and oriented x 5.  He was pleasant, cooperative, maintained good eye contact.  He engaged well in therapy session was dressed casually.  Christian [name] presented today with anxious mood\affect.  Christian [name] started session today by stating that he had some good news for "I got my disability hearing in July."  Patient reports that this is exciting news as this will be the next step for him to get out of the shelter and into her some kind of subsidized housing.  Patient reports only stressor that he feels that is going on right now is for his shelter and the people that he has to deal with.  Patient reports that there have been no incidents reported but there have been times that he has been "very annoyed".  Patient also reports that he has started classes at [employer] to start his prerequisites to eventually go for a degree.  Intervention/plan: LCSW utilized supportive therapy for praise and encouragement.  LCSW utilized person Center therapy for empowerment.  LCSW utilized education on Socratic questioning to help patient determine rational thoughts versus irrational thoughts.  LCSW provided patient worksheet to utilize Socratic questioning and he will report back to LCSW once he has attempted it in next session.    Plan: Return again in 4 weeks.  Diagnosis: MDD (major depressive disorder), recurrent episode, moderate (HCC)  GAD (generalized anxiety disorder)  Collaboration of Care: Other None today   Patient/Guardian was advised Release of Information must be obtained prior to any record release in order to collaborate their care with an outside provider. Patient/Guardian was advised if they have not already done so to contact the registration department to sign all necessary forms in order for [place] to release information regarding their care.   Consent: Patient/Guardian gives verbal consent for  treatment and assignment of benefits for services provided during this  visit. Patient/Guardian expressed understanding and agreed to proceed.   Christian Cooks, LCSW 06/06/2023

## 2023-06-09 ENCOUNTER — Encounter (HOSPITAL_COMMUNITY): Payer: Self-pay

## 2023-06-09 ENCOUNTER — Telehealth (HOSPITAL_COMMUNITY): Payer: MEDICAID | Admitting: Physician Assistant

## 2023-06-12 ENCOUNTER — Ambulatory Visit: Payer: MEDICAID | Admitting: Family Medicine

## 2023-06-13 ENCOUNTER — Other Ambulatory Visit: Payer: Self-pay

## 2023-06-13 ENCOUNTER — Other Ambulatory Visit (HOSPITAL_COMMUNITY): Payer: Self-pay

## 2023-06-13 ENCOUNTER — Ambulatory Visit (INDEPENDENT_AMBULATORY_CARE_PROVIDER_SITE_OTHER): Payer: MEDICAID | Admitting: Family Medicine

## 2023-06-13 VITALS — BP 131/81 | HR 54 | Temp 97.7°F | Resp 16 | Wt 399.6 lb

## 2023-06-13 DIAGNOSIS — I10 Essential (primary) hypertension: Secondary | ICD-10-CM

## 2023-06-13 DIAGNOSIS — Z5900 Homelessness unspecified: Secondary | ICD-10-CM

## 2023-06-13 DIAGNOSIS — E66813 Obesity, class 3: Secondary | ICD-10-CM | POA: Diagnosis not present

## 2023-06-13 DIAGNOSIS — M25561 Pain in right knee: Secondary | ICD-10-CM

## 2023-06-13 DIAGNOSIS — M25562 Pain in left knee: Secondary | ICD-10-CM

## 2023-06-13 DIAGNOSIS — Z6841 Body Mass Index (BMI) 40.0 and over, adult: Secondary | ICD-10-CM

## 2023-06-13 DIAGNOSIS — F32A Depression, unspecified: Secondary | ICD-10-CM

## 2023-06-13 DIAGNOSIS — J309 Allergic rhinitis, unspecified: Secondary | ICD-10-CM

## 2023-06-13 MED ORDER — FLUTICASONE PROPIONATE 50 MCG/ACT NA SUSP
2.0000 | Freq: Every day | NASAL | 2 refills | Status: DC
  Filled 2023-06-13: qty 16, 30d supply, fill #0
  Filled 2023-07-11: qty 16, 30d supply, fill #1
  Filled 2023-08-10: qty 16, 30d supply, fill #2

## 2023-06-13 NOTE — Progress Notes (Unsigned)
 Patient is here for their 3 month follow-up Patient has no concerns today Care gaps have been discussed with patient

## 2023-06-14 ENCOUNTER — Encounter: Payer: Self-pay | Admitting: Family Medicine

## 2023-06-14 NOTE — Progress Notes (Signed)
 Established Patient Office Visit  Subjective    Patient ID: Christian [name], male    DOB: 07-Apr-1975  Age: 48 y.o. MRN: [id number]  CC:  Chief Complaint  Patient presents with   Medical Management of Chronic Issues    HPI Christian [name] presents for routine follow up of chronic med issues. Patient denies acute complaints or concerns.   Outpatient Encounter Medications as of 06/13/2023  Medication Sig   albuterol (VENTOLIN HFA) 108 (90 Base) MCG/ACT inhaler Inhale 2 puffs into the lungs every 6 (six) hours as needed for wheezing or shortness of breath.   fluticasone-salmeterol (ADVAIR HFA) 115-21 MCG/ACT inhaler Inhale 2 puffs into the lungs 2 (two) times daily.   meloxicam (MOBIC) 15 MG tablet Take 1 tablet (15 mg total) by mouth daily.   sertraline (ZOLOFT) 100 MG tablet Take 1.5 tablets (150 mg total) by mouth at bedtime.   torsemide (DEMADEX) 20 MG tablet Take 2 tablets (40 mg total) by mouth 2 (two) times daily.   traZODone (DESYREL) 50 MG tablet Take 1 tablet (50 mg total) by mouth at bedtime.   [DISCONTINUED] fluticasone (FLONASE) 50 MCG/ACT nasal spray Spray 2 sprays into both nostrils once daily. (Patient taking differently: Place 2 sprays into both nostrils daily as needed for allergies.)   fluticasone (FLONASE) 50 MCG/ACT nasal spray Spray 2 sprays into both nostrils once daily.   potassium chloride SA (KLOR-CON M) 20 MEQ tablet Take 1 tablet (20 mEq total) by mouth daily for 3 days, THEN 1 tablet (20 mEq total) as directed.   Facility-Administered Encounter Medications as of 06/13/2023  Medication   cholecalciferol (VITAMIN D3) tablet 1,000 Units    Past Medical History:  Diagnosis Date   Allergic rhinitis    Anxiety    CHF (congestive heart failure) (HCC)    Depression    Depression    Elevated blood pressure reading without diagnosis of hypertension    History of chicken pox    Obesity    Pleural effusion    Sepsis (HCC)    Sleep apnea    O2 at night     Past Surgical History:  Procedure Laterality Date   COLON SURGERY     perforated bowel   TONSILLECTOMY AND ADENOIDECTOMY  03/01/1983    Family History  Problem Relation Age of Onset   Stroke Mother    Diabetes Mother        pre-diabetes   Heart disease Mother        CHF, pacer   Cancer Mother        lymphoma   Stomach cancer Mother    Clotting disorder Mother    Coronary artery disease Father 26       MI   Hypertension Father    Hyperlipidemia Father    Diabetes Father    Heart attack Father    Migraines Sister    Stroke Paternal Grandmother    Diabetes Paternal Grandmother    Heart disease Paternal Grandfather    Parkinson's disease Paternal Grandfather    Heart attack Paternal Grandfather     Social History   Socioeconomic History   Marital status: Single    Spouse name: Not on file   Number of children: 0   Years of education: Not on file   Highest education level: Some college, no degree  Occupational History   Not on file  Tobacco Use   Smoking status: Never    Passive exposure: Current   Smokeless tobacco:  Never   Tobacco comments:    Living in homeless shelter  Vaping Use   Vaping status: Never Used  Substance and Sexual Activity   Alcohol use: Yes    Alcohol/week: 2.0 standard drinks of alcohol    Types: 2 Cans of beer per week    Comment: 1-2 beers 1-2 days a week   Drug use: Never   Sexual activity: Not Currently  Other Topics Concern   Not on file  Social History Narrative   Caffeine: occasional, lives at a shelter [employer])  Occupation: works at [employer] part timeEdu: some college Diet: some water, some fruits/vegetables, 1-2x/wk red meat, fish 1x/wk   Social Drivers of Corporate investment banker Strain: High Risk (03/06/2023)   Overall Financial Resource Strain (CARDIA)    Difficulty of Paying Living Expenses: Very hard  Food Insecurity: Food Insecurity Present (03/06/2023)   Hunger Vital Sign    Worried About Running Out  of Food in the Last Year: Sometimes true    Ran Out of Food in the Last Year: Often true  Transportation Needs: Unmet Transportation Needs (03/06/2023)   PRAPARE - Administrator, Civil Service (Medical): Yes    Lack of Transportation (Non-Medical): Yes  Physical Activity: Unknown (03/06/2023)   Exercise Vital Sign    Days of Exercise per Week: 0 days    Minutes of Exercise per Session: Not on file  Recent Concern: Physical Activity - Inactive (03/06/2023)   Exercise Vital Sign    Days of Exercise per Week: 0 days    Minutes of Exercise per Session: 20 min  Stress: Stress Concern Present (03/06/2023)   [employer] of Occupational Health - Occupational Stress Questionnaire    Feeling of Stress : To some extent  Social Connections: Moderately Integrated (03/06/2023)   Social Connection and Isolation Panel [NHANES]    Frequency of Communication with Friends and Family: More than three times a week    Frequency of Social Gatherings with Friends and Family: Twice a week    Attends Religious Services: More than 4 times per year    Active Member of [employer] or Organizations: Yes    Attends Banker Meetings: More than 4 times per year    Marital Status: Never married  Intimate Partner Violence: Not At Risk (03/13/2023)   Humiliation, Afraid, Rape, and Kick questionnaire    Fear of Current or Ex-Partner: No    Emotionally Abused: No    Physically Abused: No    Sexually Abused: No    Review of Systems  All other systems reviewed and are negative.       Objective    BP 131/81   Pulse (!) 54   Temp 97.7 F (36.5 C) (Oral)   Resp 16   Wt (!) 399 lb 9.6 oz (181.3 kg)   SpO2 93%   BMI 51.31 kg/m   Physical Exam Vitals and nursing note reviewed.  Constitutional:      General: He is not in acute distress.    Appearance: He is obese.  Cardiovascular:     Rate and Rhythm: Normal rate and regular rhythm.  Pulmonary:     Effort: Pulmonary effort is normal.      Breath sounds: Normal breath sounds.  Abdominal:     Palpations: Abdomen is soft.     Tenderness: There is no abdominal tenderness.  Musculoskeletal:     Right lower leg: No edema.     Left lower leg:  No edema.     Comments: Utilizing rollator for stability  Neurological:     General: No focal deficit present.     Mental Status: He is alert and oriented to person, place, and time.  Psychiatric:        Mood and Affect: Mood and affect normal.        Speech: Speech normal.        Behavior: Behavior normal. Behavior is cooperative.         Assessment & Plan:   Essential hypertension  Class 3 severe obesity due to excess calories with serious comorbidity and body mass index (BMI) of 50.0 to 59.9 in adult [facility])  Homeless  Pain in both knees, unspecified chronicity  Depression, unspecified depression type  Allergic rhinitis, unspecified seasonality, unspecified trigger  Other orders -     Fluticasone Propionate; Spray 2 sprays into both nostrils once daily.  Dispense: 16 g; Refill: 2     Return in about 3 months (around 09/12/2023) for follow up, chronic med issues.   Arlo Lama, MD

## 2023-06-16 ENCOUNTER — Ambulatory Visit (INDEPENDENT_AMBULATORY_CARE_PROVIDER_SITE_OTHER): Payer: MEDICAID | Admitting: Physician Assistant

## 2023-06-16 ENCOUNTER — Other Ambulatory Visit: Payer: Self-pay

## 2023-06-16 ENCOUNTER — Encounter (HOSPITAL_COMMUNITY): Payer: Self-pay | Admitting: Physician Assistant

## 2023-06-16 DIAGNOSIS — F331 Major depressive disorder, recurrent, moderate: Secondary | ICD-10-CM | POA: Diagnosis not present

## 2023-06-16 DIAGNOSIS — F411 Generalized anxiety disorder: Secondary | ICD-10-CM

## 2023-06-16 MED ORDER — BUSPIRONE HCL 7.5 MG PO TABS
7.5000 mg | ORAL_TABLET | Freq: Two times a day (BID) | ORAL | 2 refills | Status: DC
  Filled 2023-06-16: qty 60, 30d supply, fill #0
  Filled 2023-07-11: qty 60, 30d supply, fill #1

## 2023-06-16 MED ORDER — TRAZODONE HCL 50 MG PO TABS
50.0000 mg | ORAL_TABLET | Freq: Every day | ORAL | 2 refills | Status: DC
  Filled 2023-06-16: qty 30, 30d supply, fill #0

## 2023-06-16 MED ORDER — SERTRALINE HCL 100 MG PO TABS
150.0000 mg | ORAL_TABLET | Freq: Every day | ORAL | 2 refills | Status: DC
  Filled 2023-06-16: qty 45, 30d supply, fill #0
  Filled 2023-07-11: qty 45, 30d supply, fill #1

## 2023-06-16 NOTE — Progress Notes (Signed)
 BH MD/PA/NP OP Progress Note  06/16/2023 10:14 AM Christian [name]  MRN:  [id number]  Chief Complaint:  Chief Complaint  Patient presents with   Follow-up   Medication Management   HPI:   Christian [name] is a 48 year old, Caucasian male with a past psychiatric history significant for anxiety and major depressive disorder who presents to [facility] for follow-up and medication management.  Patient is currently being managed on the following psychiatric medication:   Sertraline  150 mg daily. Trazodone  50 mg daily  Patient presents to the encounter stating that he continues to take his medications regularly and denies experiencing any adverse side effects at this time.  He reports that he has not been experiencing any drowsiness since taking his sertraline  at night.  He denies overt depressive symptoms but does endorse some anxiety due to being in a homeless shelter.  Patient rates her anxiety as 5 out of 10 and states that his main stressor revolves around his concerns with how long he will be living at the homeless shelter.  Patient also has concerns regarding his disability hearing and finding permanent housing.  Patient is currently enrolled at [facility] and is currently taking 3 classes.  A PHQ-9 screen was performed with the patient scoring a 1.  A GAD-7 screen was also performed with the patient scored a 13.  Patient is alert and oriented x 4, calm, cooperative, and fully engaged in conversation during the encounter.  Patient endorses intense mood.  Patient exhibits euthymic mood with appropriate affect.  Patient denies suicidal or homicidal ideations.  He further denies auditory or visual hallucinations and does not appear to be responding to internal/external stimuli.  Patient endorses good sleep and receives on average 7 to 8 hours of sleep per night.  Patient endorses good appetite and eats on average 3 meals per day.  Patient denies alcohol consumption,  tobacco use, or illicit drug use.  Visit Diagnosis:    ICD-10-CM   1. MDD (major depressive disorder), recurrent episode, moderate (HCC)  F33.1 sertraline  (ZOLOFT ) 100 MG tablet    traZODone  (DESYREL ) 50 MG tablet    2. GAD (generalized anxiety disorder)  F41.1 sertraline  (ZOLOFT ) 100 MG tablet    busPIRone  (BUSPAR ) 7.5 MG tablet       Past Psychiatric History:  Patient reports that when he was first hospitalized due to mental health, he was diagnosed with anxiety and depression. Patient states that he was started on antidepressants during that time but never stayed consistent with his medication management   Past Medical History:  Past Medical History:  Diagnosis Date   Allergic rhinitis    Anxiety    CHF (congestive heart failure) (HCC)    Depression    Depression    Elevated blood pressure reading without diagnosis of hypertension    History of chicken pox    Obesity    Pleural effusion    Sepsis (HCC)    Sleep apnea    O2 at night    Past Surgical History:  Procedure Laterality Date   COLON SURGERY     perforated bowel   TONSILLECTOMY AND ADENOIDECTOMY  03/01/1983    Family Psychiatric History:  Mother - patient reports that his mother had cancer when he was young and she was referred to a mental health facility Uncle (paternal) - possible schizophrenia, patient reports that his family suspected that his uncle may have been schizophrenic because he would see things   Family history of  suicide: Patient denies, but states that his mother's, who was adopted, cousin attempted suicide.  Patient states that they were not blood related. Family history of homicide: Patient denies Family history of substance abuse: Patient denies  Family History:  Family History  Problem Relation Age of Onset   Stroke Mother    Diabetes Mother        pre-diabetes   Heart disease Mother        CHF, pacer   Cancer Mother        lymphoma   Stomach cancer Mother    Clotting disorder  Mother    Coronary artery disease Father 98       MI   Hypertension Father    Hyperlipidemia Father    Diabetes Father    Heart attack Father    Migraines Sister    Stroke Paternal Grandmother    Diabetes Paternal Grandmother    Heart disease Paternal Grandfather    Parkinson's disease Paternal Grandfather    Heart attack Paternal Grandfather     Social History:  Social History   Socioeconomic History   Marital status: Single    Spouse name: Not on file   Number of children: 0   Years of education: Not on file   Highest education level: Some college, no degree  Occupational History   Not on file  Tobacco Use   Smoking status: Never    Passive exposure: Current   Smokeless tobacco: Never   Tobacco comments:    Living in homeless shelter  Vaping Use   Vaping status: Never Used  Substance and Sexual Activity   Alcohol use: Yes    Alcohol/week: 2.0 standard drinks of alcohol    Types: 2 Cans of beer per week    Comment: 1-2 beers 1-2 days a week   Drug use: Never   Sexual activity: Not Currently  Other Topics Concern   Not on file  Social History Narrative   Caffeine: occasional, lives at a shelter [employer])  Occupation: works at [employer] part timeEdu: some college Diet: some water, some fruits/vegetables, 1-2x/wk red meat, fish 1x/wk   Social Drivers of Corporate investment banker Strain: High Risk (03/06/2023)   Overall Financial Resource Strain (CARDIA)    Difficulty of Paying Living Expenses: Very hard  Food Insecurity: Food Insecurity Present (03/06/2023)   Hunger Vital Sign    Worried About Running Out of Food in the Last Year: Sometimes true    Ran Out of Food in the Last Year: Often true  Transportation Needs: Unmet Transportation Needs (03/06/2023)   PRAPARE - Administrator, Civil Service (Medical): Yes    Lack of Transportation (Non-Medical): Yes  Physical Activity: Inactive (03/06/2023)   Exercise Vital Sign    Days of Exercise per  Week: 0 days    Minutes of Exercise per Session: 20 min  Stress: Stress Concern Present (03/06/2023)   [employer] of Occupational Health - Occupational Stress Questionnaire    Feeling of Stress : To some extent  Social Connections: Moderately Integrated (03/06/2023)   Social Connection and Isolation Panel [NHANES]    Frequency of Communication with Friends and Family: More than three times a week    Frequency of Social Gatherings with Friends and Family: Twice a week    Attends Religious Services: More than 4 times per year    Active Member of [employer] or Organizations: Yes    Attends Banker Meetings: More than 4 times  per year    Marital Status: Never married    Allergies:  Allergies  Allergen Reactions   Shellfish Allergy Nausea Only    Metabolic Disorder Labs: Lab Results  Component Value Date   HGBA1C 5.8 (H) 11/19/2022   MPG 119.76 11/19/2022   MPG 125.5 10/09/2020   No results found for: "PROLACTIN" Lab Results  Component Value Date   CHOL 153 12/24/2020   TRIG 126 12/24/2020   HDL 42 12/24/2020   CHOLHDL 3.6 12/24/2020   LDLCALC 88 12/24/2020   Lab Results  Component Value Date   TSH 2.290 12/24/2020   TSH 2.650 10/09/2020    Therapeutic Level Labs: No results found for: "LITHIUM" No results found for: "VALPROATE" No results found for: "CBMZ"  Current Medications: Current Outpatient Medications  Medication Sig Dispense Refill   busPIRone  (BUSPAR ) 7.5 MG tablet Take 1 tablet (7.5 mg total) by mouth 2 (two) times daily. 60 tablet 2   albuterol  (VENTOLIN  HFA) 108 (90 Base) MCG/ACT inhaler Inhale 2 puffs into the lungs every 6 (six) hours as needed for wheezing or shortness of breath. 18 g 3   fluticasone  (FLONASE ) 50 MCG/ACT nasal spray Spray 2 sprays into both nostrils once daily. 16 g 2   fluticasone -salmeterol (ADVAIR  HFA) 115-21 MCG/ACT inhaler Inhale 2 puffs into the lungs 2 (two) times daily. 12 g 12   meloxicam  (MOBIC ) 15 MG tablet Take  1 tablet (15 mg total) by mouth daily. 30 tablet 2   potassium chloride  SA (KLOR-CON  M) 20 MEQ tablet Take 1 tablet (20 mEq total) by mouth daily for 3 days, THEN 1 tablet (20 mEq total) as directed. 30 tablet 1   sertraline  (ZOLOFT ) 100 MG tablet Take 1.5 tablets (150 mg total) by mouth at bedtime. 45 tablet 2   torsemide  (DEMADEX ) 20 MG tablet Take 2 tablets (40 mg total) by mouth 2 (two) times daily. 120 tablet 2   traZODone  (DESYREL ) 50 MG tablet Take 1 tablet (50 mg total) by mouth at bedtime. 30 tablet 2   Current Facility-Administered Medications  Medication Dose Route Frequency Provider Last Rate Last Admin   cholecalciferol (VITAMIN D3) tablet 1,000 Units  1,000 Units Oral Daily Regalado, Belkys A, MD         Musculoskeletal: Strength & Muscle Tone: within normal limits Gait & Station:  Patient ambulates with a cane Patient leans: N/A  Psychiatric Specialty Exam: Review of Systems  Psychiatric/Behavioral:  Positive for sleep disturbance. Negative for decreased concentration, dysphoric mood, hallucinations, self-injury and suicidal ideas. The patient is nervous/anxious. The patient is not hyperactive.     Blood pressure 137/76, pulse 61, temperature 98.1 F (36.7 C), temperature source Oral, height 6\' 2"  (1.88 m), weight (!) 405 lb 9.6 oz (184 kg), SpO2 96%.Body mass index is 52.08 kg/m.  General Appearance: Casual  Eye Contact:  Good  Speech:  Clear and Coherent and Normal Rate  Volume:  Normal  Mood:  Anxious  Affect:  Appropriate  Thought Process:  Coherent, Goal Directed, and Descriptions of Associations: Intact  Orientation:  Full (Time, Place, and Person)  Thought Content: WDL   Suicidal Thoughts:  No  Homicidal Thoughts:  No  Memory:  Immediate;   Good Recent;   Good Remote;   Fair  Judgement:  Fair  Insight:  Fair  Psychomotor Activity:  Normal  Concentration:  Concentration: Good and Attention Span: Good  Recall:  Good  Fund of Knowledge: Fair  Language:  Good  Akathisia:  No  Handed:  Left  AIMS (if indicated): not done  Assets:  Communication Skills Desire for Improvement Housing Social Support  ADL's:  Intact  Cognition: WNL  Sleep:   Patient has a history of sleep apnea. Patient reports receiving on average 7 - 8 hours of sleep per night     Screenings: AUDIT    Flowsheet Row Office Visit from 07/05/2022 in [name] at [facility]  Alcohol Use Disorder Identification Test Final Score (AUDIT) 3      GAD-7    Flowsheet Row Clinical Support from 06/16/2023 in [facility] Clinical Support from 04/14/2023 in [facility] Clinical Support from 02/17/2023 in [facility] Clinical Support from 12/16/2022 in [facility] Office Visit from 12/09/2022 in [facility] Primary Care at [facility]  Total GAD-7 Score 13 14 13 11 5       PHQ2-9    Flowsheet Row Clinical Support from 06/16/2023 in [facility] Clinical Support from 04/14/2023 in [facility] Office Visit from 03/13/2023 in [facility] Primary Care at [facility] Clinical Support from 02/17/2023 in [facility] from 12/22/2022 in [facility]  - A Dept Of [place]  PHQ-2 Total Score 1 1 1 1 1   PHQ-9 Total Score -- -- 5 -- --      Flowsheet Row Clinical Support from 06/16/2023 in [facility] Clinical Support from 04/14/2023 in [facility] ED from 03/02/2023 in [facility] Emergency Department at [facility]  C-SSRS RISK CATEGORY No Risk No Risk No Risk        Assessment and Plan:   Christian [name] is a 48 year old, Caucasian male with a past psychiatric history significant for anxiety and major depressive disorder who presents to [facility] via virtual video visit for follow-up and medication management.  Patient presents to the encounter stating that he continues to take his medications regularly and denies experiencing any adverse side effects.  He denies overt depressive symptoms which is reflected by his PHQ-9 screen score of 1.  Patient still continues to exhibit anxiety attributed to stressors in his life such as living in a homeless shelter and the results of his upcoming disability hearing.  Patient is interested in being placed on medication for the management of his anxiety.  Provider recommended patient be placed on buspirone  7.5 mg 2 times daily for the management of his anxiety.  Patient was agreeable to recommendation.  Patient to continue taking all other medications as prescribed.  Patient's medications to be e-prescribed to pharmacy of choice.  Patient is currently being seen by a primary care provider.  Collaboration of Care: Collaboration of Care: Medication Management AEB provider managing patient's psychiatric medications, Psychiatrist AEB patient being seen by mental health provider at this facility, and Referral or follow-up with counselor/therapist AEB patient being seen by a licensed clinical social worker at this facility  Patient/Guardian was advised Release of Information must be obtained prior to any record release in order to collaborate their care with an outside provider. Patient/Guardian was advised if they have not already done so to contact the registration department to sign all necessary forms in order for us  to release information regarding their care.   Consent: Patient/Guardian gives verbal consent for treatment and assignment of benefits for services provided during this  visit. Patient/Guardian expressed understanding and agreed to proceed.   1. MDD (major depressive disorder), recurrent episode, moderate (HCC)  - sertraline  (ZOLOFT ) 100 MG tablet; Take 1.5 tablets (150 mg  total) by mouth at bedtime.  Dispense: 45 tablet; Refill: 2 - traZODone  (DESYREL ) 50 MG tablet; Take 1 tablet (50 mg total) by mouth at bedtime.  Dispense: 30 tablet; Refill: 2  2. GAD (generalized anxiety disorder)  - sertraline  (ZOLOFT ) 100 MG tablet; Take 1.5 tablets (150 mg total) by mouth at bedtime.  Dispense: 45 tablet; Refill: 2 - busPIRone  (BUSPAR ) 7.5 MG tablet; Take 1 tablet (7.5 mg total) by mouth 2 (two) times daily.  Dispense: 60 tablet; Refill: 2  Patient to follow-up in 2 months Provider spent a total of 26 minutes with the patient/reviewing patient's chart  Gates Kasal, PA 06/16/2023, 10:14 AM

## 2023-06-19 ENCOUNTER — Encounter: Payer: Self-pay | Admitting: Family Medicine

## 2023-07-08 ENCOUNTER — Ambulatory Visit (INDEPENDENT_AMBULATORY_CARE_PROVIDER_SITE_OTHER): Payer: MEDICAID

## 2023-07-08 ENCOUNTER — Other Ambulatory Visit: Payer: Self-pay

## 2023-07-08 ENCOUNTER — Encounter (HOSPITAL_COMMUNITY): Payer: Self-pay | Admitting: *Deleted

## 2023-07-08 ENCOUNTER — Ambulatory Visit (HOSPITAL_COMMUNITY)
Admission: EM | Admit: 2023-07-08 | Discharge: 2023-07-08 | Disposition: A | Discharge status: Home / Self Care | Payer: MEDICAID

## 2023-07-08 DIAGNOSIS — E441 Mild protein-calorie malnutrition: Secondary | ICD-10-CM

## 2023-07-08 DIAGNOSIS — U071 COVID-19: Secondary | ICD-10-CM

## 2023-07-08 DIAGNOSIS — R0609 Other forms of dyspnea: Secondary | ICD-10-CM

## 2023-07-08 HISTORY — DX: Perforation of intestine (nontraumatic): K63.1

## 2023-07-08 LAB — POC SARS CORONAVIRUS 2 AG -  ED: SARS Coronavirus 2 Ag: POSITIVE — AB

## 2023-07-08 MED ORDER — ALBUTEROL SULFATE HFA 108 (90 BASE) MCG/ACT IN AERS: 1.0000 | INHALATION_SPRAY | Freq: Four times a day (QID) | RESPIRATORY_TRACT | 3 refills | Status: AC | PRN

## 2023-07-08 MED ORDER — PREDNISONE 20 MG PO TABS
40.0000 mg | ORAL_TABLET | Freq: Every day | ORAL | 0 refills | Status: AC
Start: 2023-07-08 — End: 2023-07-13

## 2023-07-08 NOTE — Discharge Instructions (Signed)
  1. COVID-19 (Primary) - ED EKG completed in UC shows normal sinus rhythm, ventricular rate of 73 bpm, low voltage QRS otherwise normal EKG, no STEMI. - POC SARS Coronavirus 2 Ag-ED - Nasal Swab completed in UC is positive for COVID-19 - DG Chest 2 View x-ray performed in UC shows no acute cardiopulmonary processes, no sign of consolidation or pneumonia. - predniSONE  (DELTASONE ) 20 MG tablet; Take 2 tablets (40 mg total) by mouth daily for 5 days.  Dispense: 10 tablet; Refill: 0 - albuterol  (VENTOLIN  HFA) 108 (90 Base) MCG/ACT inhaler; Inhale 1-2 puffs into the lungs every 6 (six) hours as needed for wheezing or shortness of breath.  Dispense: 18 g; Refill: 3 -Continue to monitor symptoms for any change in severity if there is any escalation of current symptoms or development of new symptoms follow-up in ER for further evaluation and management.

## 2023-07-08 NOTE — ED Triage Notes (Addendum)
 C/O cough with scratchy throat with slight dyspnea onset 3 days ago; c/o dyspnea with exertion -- states "I feel fine when I'm sitting". C/O feeling more fatigued than usual, some back pain. Unsure if fevers, but "once in a while I have a flushed feeling and break out in a sweat". Pt ambulates with cane.  Has been taking Nyquil and Dayquil. States he is residing in homeless shelter at present.

## 2023-07-08 NOTE — ED Provider Notes (Signed)
 [facility]  Note:  This document was prepared using Dragon voice recognition software and may include unintentional dictation errors.  MRN: [id number] DOB: [birth year]  Subjective:   Christian [name] is a 48 y.o. male presenting for dyspnea on exertion, cough, chills, fatigue x 3 days.  Patient reports that he took NyQuil and DayQuil with minimal improvement of symptoms.  Patient reports that he has a CPAP and is supposed to use oxygen but does not have access to oxygen with his current living situation but states that he is more dyspneic than normal with ambulation.  Patient currently lives in a homeless shelter.  Patient has history of CHF and sleep apnea.  Patient states he does use his CPAP every night.  Patient reports that he has had COVID 3 times in the past with similar symptoms to the current.  No chest pain, shortness of breath, weakness, dizziness at present, at rest.   Current Facility-Administered Medications:    cholecalciferol (VITAMIN D3) tablet 1,000 Units, 1,000 Units, Oral, Daily, Regalado, Belkys A, MD  Current Outpatient Medications:    busPIRone  (BUSPAR ) 7.5 MG tablet, Take 1 tablet (7.5 mg total) by mouth 2 (two) times daily., Disp: 60 tablet, Rfl: 2   fluticasone  (FLONASE ) 50 MCG/ACT nasal spray, Spray 2 sprays into both nostrils once daily., Disp: 16 g, Rfl: 2   fluticasone -salmeterol (ADVAIR  HFA) 115-21 MCG/ACT inhaler, Inhale 2 puffs into the lungs 2 (two) times daily., Disp: 12 g, Rfl: 12   meloxicam  (MOBIC ) 15 MG tablet, Take 1 tablet (15 mg total) by mouth daily., Disp: 30 tablet, Rfl: 2   predniSONE  (DELTASONE ) 20 MG tablet, Take 2 tablets (40 mg total) by mouth daily for 5 days., Disp: 10 tablet, Rfl: 0   sertraline  (ZOLOFT ) 100 MG tablet, Take 1.5 tablets (150 mg total) by mouth at bedtime., Disp: 45 tablet, Rfl: 2   torsemide  (DEMADEX ) 20 MG tablet, Take 2 tablets (40 mg total) by mouth 2 (two) times daily., Disp: 120 tablet, Rfl: 2   traZODone   (DESYREL ) 50 MG tablet, Take 1 tablet (50 mg total) by mouth at bedtime., Disp: 30 tablet, Rfl: 2   albuterol  (VENTOLIN  HFA) 108 (90 Base) MCG/ACT inhaler, Inhale 1-2 puffs into the lungs every 6 (six) hours as needed for wheezing or shortness of breath., Disp: 18 g, Rfl: 3   Multiple Vitamin (MULTIVITAMIN PO), Take by mouth., Disp: , Rfl:    potassium chloride  SA (KLOR-CON  M) 20 MEQ tablet, Take 1 tablet (20 mEq total) by mouth daily for 3 days, THEN 1 tablet (20 mEq total) as directed., Disp: 30 tablet, Rfl: 1   Allergies  Allergen Reactions   Shellfish Allergy Nausea Only    Past Medical History:  Diagnosis Date   Allergic rhinitis    Anxiety    CHF (congestive heart failure) (HCC)    Depression    Depression    Elevated blood pressure reading without diagnosis of hypertension    History of chicken pox    Obesity    Perforated bowel (HCC)    Pleural effusion    Sepsis (HCC)    Sleep apnea    O2 at night     Past Surgical History:  Procedure Laterality Date   COLON SURGERY     perforated bowel   TONSILLECTOMY AND ADENOIDECTOMY  03/01/1983    Family History  Problem Relation Age of Onset   Stroke Mother    Diabetes Mother        pre-diabetes  Heart disease Mother        CHF, pacer   Cancer Mother        lymphoma   Stomach cancer Mother    Clotting disorder Mother    Coronary artery disease Father 74       MI   Hypertension Father    Hyperlipidemia Father    Diabetes Father    Heart attack Father    Migraines Sister    Stroke Paternal Grandmother    Diabetes Paternal Grandmother    Heart disease Paternal Grandfather    Parkinson's disease Paternal Grandfather    Heart attack Paternal Grandfather     Social History   Tobacco Use   Smoking status: Never    Passive exposure: Current   Smokeless tobacco: Never   Tobacco comments:    Living in homeless shelter  Vaping Use   Vaping status: Never Used  Substance Use Topics   Alcohol use: Not Currently     Comment: occasionally   Drug use: Never    ROS Refer to HPI for ROS details.  Objective:   Vitals: BP 138/84   Pulse 73   Temp 98.2 F (36.8 C) (Oral)   Resp 18   SpO2 93%   Physical Exam Vitals and nursing note reviewed.  Constitutional:      General: He is not in acute distress.    Appearance: He is well-developed. He is not ill-appearing, toxic-appearing or diaphoretic.  HENT:     Head: Normocephalic.     Mouth/Throat:     Mouth: Mucous membranes are moist.     Pharynx: Oropharynx is clear.  Eyes:     Extraocular Movements: Extraocular movements intact.  Cardiovascular:     Rate and Rhythm: Normal rate and regular rhythm.     Heart sounds: No murmur heard. Pulmonary:     Effort: Pulmonary effort is normal. No respiratory distress.     Breath sounds: Normal breath sounds. No decreased breath sounds, wheezing, rhonchi or rales.  Skin:    General: Skin is warm and dry.  Neurological:     General: No focal deficit present.     Mental Status: He is alert and oriented to person, place, and time.  Psychiatric:        Mood and Affect: Mood normal.        Behavior: Behavior normal.     Procedures  Results for orders placed or performed during the hospital encounter of 07/08/23 (from the past 24 hours)  POC SARS Coronavirus 2 Ag-ED - Nasal Swab     Status: Abnormal   Collection Time: 07/08/23  1:28 PM  Result Value Ref Range   SARS Coronavirus 2 Ag Positive (A) Negative    No results found.   Assessment and Plan :     Discharge Instructions       1. COVID-19 (Primary) - ED EKG completed in UC shows normal sinus rhythm, ventricular rate of 73 bpm, low voltage QRS otherwise normal EKG, no STEMI. - POC SARS Coronavirus 2 Ag-ED - Nasal Swab completed in UC is positive for COVID-19 - DG Chest 2 View x-ray performed in UC shows no acute cardiopulmonary processes, no sign of consolidation or pneumonia. - predniSONE  (DELTASONE ) 20 MG tablet; Take 2 tablets  (40 mg total) by mouth daily for 5 days.  Dispense: 10 tablet; Refill: 0 - albuterol  (VENTOLIN  HFA) 108 (90 Base) MCG/ACT inhaler; Inhale 1-2 puffs into the lungs every 6 (six) hours as needed for wheezing or  shortness of breath.  Dispense: 18 g; Refill: 3 -Continue to monitor symptoms for any change in severity if there is any escalation of current symptoms or development of new symptoms follow-up in ER for further evaluation and management.    Sincere Liuzzi B Jaizon Deroos   Loneta Tamplin, [place], [place] 07/08/23 1419

## 2023-07-10 ENCOUNTER — Encounter: Payer: Self-pay | Admitting: *Deleted

## 2023-07-10 NOTE — Congregational Nurse Program (Signed)
  Dept: [phone]   Congregational Nurse Program Note  Date of Encounter: 07/10/2023  Past Medical History: Past Medical History:  Diagnosis Date   Allergic rhinitis    Anxiety    CHF (congestive heart failure) (HCC)    Depression    Depression    Elevated blood pressure reading without diagnosis of hypertension    History of chicken pox    Obesity    Perforated bowel (HCC)    Pleural effusion    Sepsis (HCC)    Sleep apnea    O2 at night    Encounter Details:  Community Questionnaire - 07/10/23 1045       Questionnaire   Ask client: Do you give verbal consent for me to treat you today? Yes    Student Assistance N/A    Location Patient Served  [facility]    Encounter Setting CN site    Population Status Unhoused    Insurance Medicaid    Insurance/Financial Assistance Referral N/A    Medication N/A    Medical Provider Yes    Screening Referrals Made N/A    Medical Referrals Made N/A    Medical Appointment Completed N/A    CNP Interventions Advocate/Support    Screenings CN Performed Blood Pressure    ED Visit Averted N/A    Life-Saving Intervention Made N/A            [facility] other [facility] due to a covid dx two days ago. Writer checked on client and he reports shortness of breath getting up and going to restroom. Checked vitals. Blood pressure (!) 149/90, pulse 61, temperature 98.3 F (36.8 C), temperature source Oral, SpO2 97%. Took lunch and bottles of water along with hydration packs to client. Offered support and encouragement.  Franke Menter W RN CN

## 2023-07-11 ENCOUNTER — Ambulatory Visit (HOSPITAL_COMMUNITY): Payer: Self-pay

## 2023-07-11 ENCOUNTER — Ambulatory Visit (INDEPENDENT_AMBULATORY_CARE_PROVIDER_SITE_OTHER): Payer: MEDICAID | Admitting: Licensed Clinical Social Worker

## 2023-07-11 ENCOUNTER — Other Ambulatory Visit (HOSPITAL_COMMUNITY): Payer: Self-pay

## 2023-07-11 DIAGNOSIS — F331 Major depressive disorder, recurrent, moderate: Secondary | ICD-10-CM | POA: Diagnosis not present

## 2023-07-11 DIAGNOSIS — F411 Generalized anxiety disorder: Secondary | ICD-10-CM

## 2023-07-11 NOTE — Progress Notes (Signed)
 THERAPIST PROGRESS NOTE  Virtual Visit via Video Note  I connected with Christian [name] on 07/11/23 at 10:00 AM EDT by a video enabled telemedicine application and verified that I am speaking with the correct person using two identifiers.  Location: Patient: [facility]  Provider: Providers Home    I discussed the limitations of evaluation and management by telemedicine and the availability of in person appointments. The patient expressed understanding and agreed to proceed.     I discussed the assessment and treatment plan with the patient. The patient was provided an opportunity to ask questions and all were answered. The patient agreed with the plan and demonstrated an understanding of the instructions.   The patient was advised to call back or seek an in-person evaluation if the symptoms worsen or if the condition fails to improve as anticipated.  I provided 30  minutes of non-face-to-face time during this encounter.   Maryagnes Small, LCSW   Participation Level: Active  Behavioral Response: CasualAlertAnxious and Depressed  Type of Therapy: Individual Therapy  Treatment Goals addressed:  Active     Anxiety     LTG: Christian [name] will score less than 5 on the Generalized Anxiety Disorder 7 Scale (GAD-7)  (Completed/Met)     Start:  05/11/22    Expected End:  10/28/22    Resolved:  08/17/22    Goal Note     Scpre taken today and last session scored below a 5 each time          STG: Christian [name] will complete at least 80% of assigned homework  (Progressing)     Start:  05/11/22    Expected End:  09/01/23         STG: Christian [name] will practice problem solving skills 3 times per week for the next 4 weeks.  (Progressing)     Start:  05/11/22    Expected End:  09/01/23         identify 3 trigger for anxiety  (Progressing)     Start:  05/11/22    Expected End:  09/01/23         create  3 coping skills for depression  (Progressing)     Start:  05/11/22    Expected  End:  09/01/23         Discuss risks and benefits of medication treatment options for this problem and prescribe as indicated (Completed)     Start:  05/11/22    End:  07/11/23      Encourage Christian [name] to take psychotropic medication(s) as prescribed (Completed)     Start:  05/11/22    End:  07/11/23      Review results of GAD-7 with Christian [name] to track progress (Completed)     Start:  05/11/22    End:  06/28/22    Intervention Note     Reviewed with patient during session.          Work with Christian [name] to track symptoms, triggers, and/or skill use through a mood chart, diary card, or journal (Completed)     Start:  05/11/22    End:  06/07/22      Perform psychoeducation regarding anxiety disorders (Completed)     Start:  05/11/22    End:  06/07/22      Provide Christian [name] with educational information and reading material on anxiety, its causes, and symptoms.  (Completed)     Start:  05/11/22    End:  06/07/22  Work with patient individually to identify the major components of a recent episode of anxiety: physical symptoms, major thoughts and images, and major behaviors they experienced (Completed)     Start:  05/11/22    End:  06/07/22      Work with Christian [name] to identify 3 personal goals for managing their anxiety to work on during current treatment.  (Completed)     Start:  05/11/22    End:  06/28/22      Work with Christian [name] to identify a minimum of 3 consequences of avoidance.  (Completed)     Start:  05/11/22    End:  06/28/22      Work with Christian [name] to identify a minimum of 3 alternative coping behaviors to avoidance.  (Completed)     Start:  05/11/22    End:  07/11/23      Create a weekly activity schedule (Completed)     Start:  05/11/22    End:  07/11/23        OP Depression     LTG: Reduce frequency, intensity, and duration of depression symptoms so that daily functioning is improved (Progressing)     Start:  05/11/22    Expected End:  09/01/23          LTG: Increase coping skills to manage depression and improve ability to perform daily activities (Progressing)     Start:  05/11/22    Expected End:  09/01/23         LTG: Christian [name] will score less than 10 on the Patient Health Questionnaire (PHQ-9)  (Completed/Met)     Start:  05/11/22    Expected End:  10/28/22    Resolved:  08/17/22    Goal Note     Scpre taken today and last session scored below a 5 each time          Work with Christian [name] to track symptoms, triggers, and/or skill use through a mood chart, diary card, or journal (Completed)     Start:  05/11/22    End:  06/07/22      Therapist will administer the PHQ-9 at weekly intervals for the next 8 weeks (Completed)     Start:  05/11/22    End:  06/28/22      Work with Christian [name] to identify the major components of a recent episode of depression: physical symptoms, major thoughts and images, and major behaviors they experienced (Completed)     Start:  05/11/22    End:  06/07/22         ProgressTowards Goals: Progressing  Interventions: CBT, Motivational Interviewing, and Supportive  Suicidal/Homicidal: Nowithout intent/plan  Therapist Response:      Christian [name] was alert and oriented x 5.  He was pleasant, cooperative, maintained good eye contact.  He engaged well in therapy session and was dressed casually.  Christian [name] presented with depressed and anxious mood\affect.  Christian [name] reports primary stressor as illness.  He reports that he is currently quarantined  due to COVID 19.  He reports that due to being in a shelter they have isolated him in a certain room which has actually been beneficial as it has been draining and normally residents are asked to leave from 9 AM until 3 PM every day.  Patient reports because of his COVID-19 quarantine he is asked to stay in the room and not leave.  He reports that he is getting a nurse to check in on him and giving food delivered to him.  Patient reports  that he continues to engage in activities  such as computer classes, support groups, and following up with his lawyers for Social Security disability claim.  Patient reports that he struggles with comparing himself to others and seeing other people get housing or income prior to him.  Christian [name] states that this increases his depression and anxiety for tension, worry, worthlessness, and hopelessness.  Intervention/plan: LCSW utilized psychoanalytic therapy for patient to express thoughts, feelings and emotions and nonjudgmental environment.  LCSW utilized supportive therapy for praise and encouragement.  LCSW reviewed long-term and short-term goals patient utilizing strength-based therapy.  LCSW utilized cognitive behavioral therapy for cognitive restructuring and reframing.  LCSW spoke with patient about triggers for anxiety such as comparing himself to others and worrying about financial independence.  Plan: Return again in 5 weeks.  Diagnosis: MDD (major depressive disorder), recurrent episode, moderate (HCC)  GAD (generalized anxiety disorder)  Collaboration of Care: Other None today   Patient/Guardian was advised Release of Information must be obtained prior to any record release in order to collaborate their care with an outside provider. Patient/Guardian was advised if they have not already done so to contact the registration department to sign all necessary forms in order for us  to release information regarding their care.   Consent: Patient/Guardian gives verbal consent for treatment and assignment of benefits for services provided during this visit. Patient/Guardian expressed understanding and agreed to proceed.   Maryagnes Small, LCSW 07/11/2023

## 2023-07-13 ENCOUNTER — Other Ambulatory Visit: Payer: Self-pay | Admitting: Emergency Medicine

## 2023-07-13 ENCOUNTER — Other Ambulatory Visit: Payer: Self-pay

## 2023-07-13 ENCOUNTER — Other Ambulatory Visit: Payer: Self-pay | Admitting: Critical Care Medicine

## 2023-07-13 MED ORDER — DOXYCYCLINE HYCLATE 100 MG PO TABS
100.0000 mg | ORAL_TABLET | Freq: Two times a day (BID) | ORAL | 0 refills | Status: AC
Start: 2023-07-13 — End: 2023-07-21
  Filled 2023-07-13 – 2023-07-14 (×2): qty 14, 7d supply, fill #0

## 2023-07-13 NOTE — Progress Notes (Signed)
 Patient started having URI symptoms around 5/7 Seen at [facility] on 5/10 and found to be COVID+  Patient is now COVID negative but reporting continued cough and dyspnea with exertion No fever today Pulse ox 94% No CP No hemoptysis He is a nonsmoker He uses CPAP at night without issue  On exam he is in no distress Lungs are CTA-B but appears mildly dyspneic No new LE edema (chronic edema noted)  Will add on doxycycline  in case there is new bacterial infection Albuterol  q4 hrs next 2-3 days He will stay in day room for 24 hrs Discussed ER precautions

## 2023-07-13 NOTE — Progress Notes (Signed)
 Rx sent to [facility] pharm:DOXYCYCLINE  and albuterol 

## 2023-07-14 ENCOUNTER — Other Ambulatory Visit (HOSPITAL_COMMUNITY): Payer: Self-pay

## 2023-07-14 ENCOUNTER — Other Ambulatory Visit: Payer: Self-pay

## 2023-07-19 ENCOUNTER — Other Ambulatory Visit (INDEPENDENT_AMBULATORY_CARE_PROVIDER_SITE_OTHER): Payer: MEDICAID

## 2023-07-19 ENCOUNTER — Other Ambulatory Visit (HOSPITAL_COMMUNITY): Payer: Self-pay

## 2023-07-19 ENCOUNTER — Ambulatory Visit: Payer: MEDICAID | Admitting: Physician Assistant

## 2023-07-19 ENCOUNTER — Encounter: Payer: Self-pay | Admitting: Physician Assistant

## 2023-07-19 ENCOUNTER — Other Ambulatory Visit: Payer: Self-pay

## 2023-07-19 DIAGNOSIS — M25561 Pain in right knee: Secondary | ICD-10-CM

## 2023-07-19 DIAGNOSIS — G8929 Other chronic pain: Secondary | ICD-10-CM

## 2023-07-19 DIAGNOSIS — M25562 Pain in left knee: Secondary | ICD-10-CM

## 2023-07-19 DIAGNOSIS — M1711 Unilateral primary osteoarthritis, right knee: Secondary | ICD-10-CM

## 2023-07-19 DIAGNOSIS — M1712 Unilateral primary osteoarthritis, left knee: Secondary | ICD-10-CM | POA: Diagnosis not present

## 2023-07-19 MED ORDER — MELOXICAM 15 MG PO TABS
15.0000 mg | ORAL_TABLET | Freq: Every day | ORAL | 2 refills | Status: DC
  Filled 2023-07-19: qty 30, 30d supply, fill #0
  Filled 2023-09-07: qty 30, 30d supply, fill #1
  Filled 2023-10-05: qty 30, 30d supply, fill #2

## 2023-07-19 MED ORDER — METHYLPREDNISOLONE ACETATE 40 MG/ML IJ SUSP
40.0000 mg | INTRAMUSCULAR | Status: AC | PRN
  Administered 2023-07-19: 40 mg via INTRA_ARTICULAR

## 2023-07-19 MED ORDER — LIDOCAINE HCL 1 % IJ SOLN
3.0000 mL | INTRAMUSCULAR | Status: AC | PRN
  Administered 2023-07-19: 3 mL

## 2023-07-19 NOTE — Progress Notes (Signed)
 Office Visit Note   Patient: Christian [name]           Date of Birth: 1975-07-23           MRN: [id number] Visit Date: 07/19/2023              Requested by: Christian Abo, MD [address] [place],  [place] [address] PCP: Christian Abo, MD   Assessment & Plan: Visit Diagnoses:  1. Primary osteoarthritis of right knee   2. Primary osteoarthritis of left knee     Plan: Refill on meloxicam  given.  Also prescription for rollator/walker.  Discussed with him weight loss and quad strengthening exercises along with knee friendly exercises.  He will follow-up with us  as needed.  He knows to wait 3 months between cortisone injections.  Follow-Up Instructions: Return if symptoms worsen or fail to improve.   Orders:  Orders Placed This Encounter  Procedures   Large Joint Inj: R knee   XR Knee 1-2 Views Right   XR Knee 1-2 Views Left   Meds ordered this encounter  Medications   meloxicam  (MOBIC ) 15 MG tablet    Sig: Take 1 tablet (15 mg total) by mouth daily.    Dispense:  30 tablet    Refill:  2      Procedures: Large Joint Inj: R knee on 07/19/2023 10:07 AM Indications: pain Details: 22 G 1.5 in needle, anterolateral approach  Arthrogram: No  Medications: 3 mL lidocaine  1 %; 40 mg methylPREDNISolone  acetate 40 MG/ML Outcome: tolerated well, no immediate complications Procedure, treatment alternatives, risks and benefits explained, specific risks discussed. Consent was given by the patient. Immediately prior to procedure a time out was called to verify the correct patient, procedure, equipment, support staff and site/side marked as required. Patient was prepped and draped in the usual sterile fashion.       Clinical Data: No additional findings.   Subjective: Chief Complaint  Patient presents with   Right Knee - Pain   Left Knee - Pain    HPI Christian [name] returns today for right knee pain.  He notes he recently he has had grinding in the knee and is  using a cane.  Pain is worse with walking long distances and stairs also along with sitting.  He lives at the [facility].  States the pain is increased to the right knee over the last month.  Recently had COVID.  He has been taking meloxicam  and this is somewhat beneficial.  Review of Systems Negative for fevers or chills  Objective: Vital Signs: There were no vitals taken for this visit.  Physical Exam Constitutional:      Appearance: He is not ill-appearing or diaphoretic.  Pulmonary:     Effort: Pulmonary effort is normal.  Neurological:     Mental Status: He is alert.  Psychiatric:        Mood and Affect: Mood normal.     Ortho Exam Bilateral knees: Good range of motion of both knees.  No abnormal warmth erythema or effusion of either knee.  Right knee no instability valgus varus stressing nontender along medial lateral joint line.  Tenderness right knee peripatellar region. Specialty Comments:  No specialty comments available.  Imaging: XR Knee 1-2 Views Right Result Date: 07/19/2023 Right knee 2 views: Tricompartmental arthritis with severe patellofemoral changes.  Mild to moderate medial lateral compartmental changes.  No significant changes from March 2024 films.  No acute fracture  XR Knee 1-2  Views Left Result Date: 07/19/2023 Left knee 2 views: Tricompartmental changes with mild lateral compartmental changes moderate narrowing medial compartment moderate to severe patellofemoral changes.  Knee is well located no acute fractures acute findings.    PMFS History: Patient Active Problem List   Diagnosis Date Noted   Aspiration pneumonia (HCC) 11/19/2022   Mild protein malnutrition (HCC) 11/19/2022   Asthma 08/31/2022   CAP (community acquired pneumonia) 06/14/2022   Lung nodules 05/05/2022   GAD (generalized anxiety disorder) 03/30/2022   Inadequate community resources 03/29/2022   MDD (major depressive disorder), recurrent episode, moderate (HCC)  03/29/2022   Housing instability, currently housed, at risk for homelessness 03/29/2022   Dyspnea 03/18/2022   OSA (obstructive sleep apnea) 03/17/2022   COVID 01/07/2022   Nocturnal hypoxia    Sepsis without acute organ dysfunction (HCC)    Influenza A 01/15/2021   Homelessness 01/15/2021   Hypokalemia 01/15/2021   Cellulitis and abscess of buttock 01/08/2021   Prediabetes 01/08/2021   Pleural effusion 11/25/2020   Chronic diastolic CHF (congestive heart failure) (HCC) 11/25/2020   Depression 11/25/2020   Obesity hypoventilation syndrome (HCC) 10/17/2020   Acute on chronic respiratory failure with hypoxia and hypercapnia (HCC)    Bowel perforation (HCC)    Hypotension    Acute respiratory failure with hypoxemia (HCC)    Weakness    Cellulitis of abdominal wall 10/10/2020   Impaired fasting glucose    Hyperglycemia 10/09/2020   AKI (acute kidney injury) (HCC) 10/09/2020   Essential hypertension 10/09/2020   Chest pain 04/06/2011   Morbid obesity with BMI of 50.0-59.9, adult (HCC)    Elevated blood pressure reading without diagnosis of hypertension    Past Medical History:  Diagnosis Date   Allergic rhinitis    Anxiety    CHF (congestive heart failure) (HCC)    Depression    Depression    Elevated blood pressure reading without diagnosis of hypertension    History of chicken pox    Obesity    Perforated bowel (HCC)    Pleural effusion    Sepsis (HCC)    Sleep apnea    O2 at night    Family History  Problem Relation Age of Onset   Stroke Mother    Diabetes Mother        pre-diabetes   Heart disease Mother        CHF, pacer   Cancer Mother        lymphoma   Stomach cancer Mother    Clotting disorder Mother    Coronary artery disease Father 54       MI   Hypertension Father    Hyperlipidemia Father    Diabetes Father    Heart attack Father    Migraines Sister    Stroke Paternal Grandmother    Diabetes Paternal Grandmother    Heart disease Paternal  Grandfather    Parkinson's disease Paternal Grandfather    Heart attack Paternal Grandfather     Past Surgical History:  Procedure Laterality Date   COLON SURGERY     perforated bowel   TONSILLECTOMY AND ADENOIDECTOMY  03/01/1983   Social History   Occupational History   Not on file  Tobacco Use   Smoking status: Never    Passive exposure: Current   Smokeless tobacco: Never   Tobacco comments:    Living in homeless shelter  Vaping Use   Vaping status: Never Used  Substance and Sexual Activity   Alcohol use: Not Currently  Comment: occasionally   Drug use: Never   Sexual activity: Not on file

## 2023-07-20 ENCOUNTER — Telehealth: Payer: Self-pay | Admitting: Physician Assistant

## 2023-07-20 NOTE — Telephone Encounter (Signed)
 Patient called. [facility] fax # [phone] for rolling walker and office notes to be sent.

## 2023-07-20 NOTE — Telephone Encounter (Signed)
 Patient called and said that he got an prescription for rollator walker with the seat on it. And needs it to be faxed to [facility]

## 2023-07-21 NOTE — Telephone Encounter (Signed)
 Called patient left message that everything has been sent.

## 2023-07-27 NOTE — Progress Notes (Unsigned)
 Cardiology Office Note    Patient Name: Christian [name] Date of Encounter: 07/27/2023  Primary Care Provider:  Abraham Abo, MD Primary Cardiologist:  None Primary Electrophysiologist: None   Past Medical History    Past Medical History:  Diagnosis Date   Allergic rhinitis    Anxiety    CHF (congestive heart failure) (HCC)    Depression    Depression    Elevated blood pressure reading without diagnosis of hypertension    History of chicken pox    Obesity    Perforated bowel (HCC)    Pleural effusion    Sepsis (HCC)    Sleep apnea    O2 at night    History of Present Illness  Christian [name] is a 48 y.o. male with a PMH of HFpEF, OSA, HTN, homelessness, obesity who presents today for 1 year follow-up.  [name] was seen initially by Dr. Renna [name] on 08/01/2022 to establish care for management of CHF.  He completed a TTE with EF of 60-65% and grade 1 DD.  He was continued with torsemide  with dose adjustment to 40 mg twice daily.  He was currently at [employer] and applying for disability.  He was admitted on 11/18/2022 with aspiration pneumonia and severe multifocal infiltrates per CT.previous hospitalization with severe pneumonia in 2022.  He was treated with Unasyn  x 7 days.  He was seen most recently on 07/08/2023 with complaint of dyspnea on exertion with fatigue and chills.  EKG was completed showing normal sinus rhythm and chest x-ray was performed with no evidence of pneumonia.  He was prescribed Ventolin  and prednisone  taper and discharged in stable condition.   Patient denies chest pain, palpitations, dyspnea, PND, orthopnea, nausea, vomiting, dizziness, syncope, edema, weight gain, or early satiety.   Discussed the use of AI scribe software for clinical note transcription with the patient, who gave verbal consent to proceed.  History of Present Illness    ***Notes:   Review of Systems  Please see the history of present illness.    All other systems reviewed  and are otherwise negative except as noted above.  Physical Exam     Wt Readings from Last 3 Encounters:  06/13/23 (!) 399 lb 9.6 oz (181.3 kg)  05/18/23 (!) 404 lb 12.8 oz (183.6 kg)  03/22/23 (!) 391 lb 9.6 oz (177.6 kg)   ZO:XWRUE were no vitals filed for this visit.,There is no height or weight on file to calculate BMI. GEN: Well nourished, well developed in no acute distress Neck: No JVD; No carotid bruits Pulmonary: Clear to auscultation without rales, wheezing or rhonchi  Cardiovascular: Normal rate. Regular rhythm. Normal S1. Normal S2.   Murmurs: There is no murmur.  ABDOMEN: Soft, non-tender, non-distended EXTREMITIES:  No edema; No deformity   EKG/LABS/ Recent Cardiac Studies   ECG personally reviewed by me today - ***  Risk Assessment/Calculations:   {Does this patient have ATRIAL FIBRILLATION?[phone]}      Lab Results  Component Value Date   WBC 15.7 (H) 03/02/2023   HGB 16.8 03/02/2023   HCT 51.0 03/02/2023   MCV 83.6 03/02/2023   PLT 307 03/02/2023   Lab Results  Component Value Date   CREATININE 1.29 (H) 03/02/2023   BUN 23 (H) 03/02/2023   NA 138 03/02/2023   K 3.6 03/02/2023   CL 101 03/02/2023   CO2 27 03/02/2023   Lab Results  Component Value Date   CHOL 153 12/24/2020   HDL 42 12/24/2020  LDLCALC 88 12/24/2020   TRIG 126 12/24/2020   CHOLHDL 3.6 12/24/2020    Lab Results  Component Value Date   HGBA1C 5.8 (H) 11/19/2022   Assessment & Plan    Assessment and Plan Assessment & Plan     1.  HFpEF  2.  Essential hypertension  3.  OSA  4.  Obesity      Disposition: Follow-up with None or APP in *** months {Are you ordering a CV Procedure (e.g. stress test, cath, DCCV, TEE, etc)?   Press F2        :[id number]}   Signed, Christian [name], Christian Cast, NP 07/27/2023, 1:05 PM [facility]

## 2023-07-28 ENCOUNTER — Encounter: Payer: Self-pay | Admitting: Nurse Practitioner

## 2023-07-28 ENCOUNTER — Ambulatory Visit: Payer: MEDICAID | Attending: Nurse Practitioner | Admitting: Nurse Practitioner

## 2023-07-28 ENCOUNTER — Other Ambulatory Visit (HOSPITAL_COMMUNITY): Payer: Self-pay

## 2023-07-28 VITALS — BP 118/80 | HR 72 | Ht 74.0 in | Wt >= 6400 oz

## 2023-07-28 DIAGNOSIS — I5032 Chronic diastolic (congestive) heart failure: Secondary | ICD-10-CM | POA: Diagnosis present

## 2023-07-28 MED ORDER — TORSEMIDE 20 MG PO TABS
40.0000 mg | ORAL_TABLET | Freq: Two times a day (BID) | ORAL | 3 refills | Status: DC
  Filled 2023-07-28 – 2023-08-10 (×2): qty 360, 90d supply, fill #0
  Filled 2023-11-06: qty 360, 90d supply, fill #1
  Filled 2024-02-03 – 2024-02-08 (×2): qty 360, 90d supply, fill #2

## 2023-07-28 NOTE — Patient Instructions (Signed)
 Medication Instructions:  Your physician recommends that you continue on your current medications as directed. Please refer to the Current Medication list given to you today. *If you need a refill on your cardiac medications before your next appointment, please call your pharmacy*  Lab Work: TODAY-BMET & BNP If you have labs (blood work) drawn today and your tests are completely normal, you will receive your results only by: MyChart Message (if you have MyChart) OR A paper copy in the mail If you have any lab test that is abnormal or we need to change your treatment, we will call you to review the results.  Testing/Procedures: NONE ORDERED  Follow-Up: At [facility], you and your health needs are our priority.  As part of our continuing mission to provide you with exceptional heart care, our providers are all part of one team.  This team includes your primary Cardiologist (physician) and Advanced Practice Providers or APPs (Physician Assistants and Nurse Practitioners) who all work together to provide you with the care you need, when you need it.  Your next appointment:   12 month(s)  Provider:   Dorothye Gathers, MD    We recommend signing up for the patient portal called "MyChart".  Sign up information is provided on this After Visit Summary.  MyChart is used to connect with patients for Virtual Visits (Telemedicine).  Patients are able to view lab/test results, encounter notes, upcoming appointments, etc.  Non-urgent messages can be sent to your provider as well.   To learn more about what you can do with MyChart, go to [email or web address].   Other Instructions

## 2023-07-29 ENCOUNTER — Ambulatory Visit: Payer: Self-pay | Admitting: Nurse Practitioner

## 2023-07-29 LAB — BASIC METABOLIC PANEL WITH GFR
BUN/Creatinine Ratio: 17 (ref 9–20)
BUN: 20 mg/dL (ref 6–24)
CO2: 23 mmol/L (ref 20–29)
Calcium: 9.4 mg/dL (ref 8.7–10.2)
Chloride: 102 mmol/L (ref 96–106)
Creatinine, Ser: 1.19 mg/dL (ref 0.76–1.27)
Glucose: 75 mg/dL (ref 70–99)
Potassium: 4 mmol/L (ref 3.5–5.2)
Sodium: 145 mmol/L — ABNORMAL HIGH (ref 134–144)
eGFR: 75 mL/min/{1.73_m2} (ref >59)

## 2023-07-29 LAB — PRO B NATRIURETIC PEPTIDE: NT-Pro BNP: 124 pg/mL — ABNORMAL HIGH (ref 0–121)

## 2023-08-04 ENCOUNTER — Other Ambulatory Visit (HOSPITAL_COMMUNITY): Payer: Self-pay

## 2023-08-04 ENCOUNTER — Ambulatory Visit (INDEPENDENT_AMBULATORY_CARE_PROVIDER_SITE_OTHER): Payer: MEDICAID | Admitting: Physician Assistant

## 2023-08-04 DIAGNOSIS — F411 Generalized anxiety disorder: Secondary | ICD-10-CM | POA: Diagnosis not present

## 2023-08-04 DIAGNOSIS — F331 Major depressive disorder, recurrent, moderate: Secondary | ICD-10-CM | POA: Diagnosis not present

## 2023-08-04 MED ORDER — BUSPIRONE HCL 7.5 MG PO TABS
7.5000 mg | ORAL_TABLET | Freq: Two times a day (BID) | ORAL | 2 refills | Status: DC
  Filled 2023-08-04: qty 60, 30d supply, fill #0
  Filled 2023-09-25: qty 60, 30d supply, fill #1

## 2023-08-04 MED ORDER — SERTRALINE HCL 100 MG PO TABS
150.0000 mg | ORAL_TABLET | Freq: Every day | ORAL | 2 refills | Status: DC
  Filled 2023-08-04: qty 45, 30d supply, fill #0
  Filled 2023-09-25: qty 45, 30d supply, fill #1

## 2023-08-04 MED ORDER — TRAZODONE HCL 50 MG PO TABS
50.0000 mg | ORAL_TABLET | Freq: Every day | ORAL | 2 refills | Status: DC
  Filled 2023-08-04: qty 30, 30d supply, fill #0
  Filled 2023-09-25: qty 30, 30d supply, fill #1
  Filled 2023-10-20: qty 30, 30d supply, fill #2

## 2023-08-09 ENCOUNTER — Encounter (HOSPITAL_COMMUNITY): Payer: Self-pay | Admitting: Physician Assistant

## 2023-08-09 ENCOUNTER — Other Ambulatory Visit (HOSPITAL_COMMUNITY): Payer: Self-pay

## 2023-08-09 NOTE — Progress Notes (Signed)
 BH MD/PA/NP OP Progress Note  08/04/2023 9:41 PM Christian [name]  MRN:  [id number]  Chief Complaint:  Chief Complaint  Patient presents with   Follow-up   Medication Refill   HPI:   Christian [name] is a 48 year old, Caucasian male with a past psychiatric history significant for anxiety and major depressive disorder who presents to [facility] for follow-up and medication management.  Patient is currently being managed on the following psychiatric medication:   Sertraline  150 mg daily. Trazodone  50 mg daily Buspirone  7.5 mg 2 times daily  Patient presents to the encounter stating that he was off buspirone  for a short period of time.  Since restarting buspirone , patient has not noticed any difference in the efficacy of the medication.  He does report that the medication appears to take a little longer to kick in when managing his anxiety.  In regards to his use of trazodone , patient reports no issues with the medication.  He does report that he was taking trazodone  a little later in the night causing him to wake up later in the morning.  Patient denies any other issues or concerns regarding his current medication regimen.  He does report that when not taking his medications with water, he may experience heartburn, upset stomach, or nausea.  Patient endorses some depression and attributes his depression to being around people in the homeless shelter.  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  Patient rates his depression a 4 out of 10 with 10 being most severe.  Patient endorses depressive episodes 2 days/week.  Patient endorses the following depressive symptoms: feelings of sadness, decreased concentration, and irritability.  Patient denies lack of motivation, feelings of guilt/worthlessness, or hopelessness.  Patient also endorses anxiety and rates his anxiety a 5 or 6  out of 10.  Patient's current stressors include the following: school, his upcoming disability hearing, and receiving a notice regarding his stay at the homeless shelter.  A PHQ-9 screen was performed with the patient scoring a 1.  A GAD-7 screen was also performed with the patient scoring a 14.  Patient is alert and oriented x 4, calm, cooperative, and fully engaged in conversation during the encounter.  Patient describes his mood as optimistic and focused.  Patient exhibits euthymic mood with appropriate affect.  Patient denies suicidal or homicidal ideations.  He further denies auditory or visual hallucinations and does not appear to be responding to internal/external stimuli.  Patient endorses good sleep and receives on average 8 hours of sleep per night.  Patient endorses good appetite and eats on average 3 meals per day along with snacking.  Patient denies alcohol consumption, tobacco use, or illicit drug use.  Visit Diagnosis:    ICD-10-CM   1. MDD (major depressive disorder), recurrent episode, moderate (HCC)  F33.1 sertraline  (ZOLOFT ) 100 MG tablet    traZODone  (DESYREL ) 50 MG tablet    2. GAD (generalized anxiety disorder)  F41.1 sertraline  (ZOLOFT ) 100 MG tablet    busPIRone  (BUSPAR ) 7.5 MG tablet      Past Psychiatric History:  Patient reports that when he was first hospitalized due to mental health, he was diagnosed with anxiety and depression. Patient states that he was started on antidepressants during that time but never stayed consistent with his medication management   Past Medical History:  Past Medical History:  Diagnosis Date   Allergic rhinitis  Anxiety    CHF (congestive heart failure) (HCC)    Depression    Depression    Elevated blood pressure reading without diagnosis of hypertension    History of chicken pox    Obesity    Perforated bowel (HCC)    Pleural effusion    Sepsis (HCC)    Sleep apnea    O2 at night    Past Surgical History:  Procedure  Laterality Date   COLON SURGERY     perforated bowel   TONSILLECTOMY AND ADENOIDECTOMY  03/01/1983    Family Psychiatric History:  Mother - patient reports that his mother had cancer when he was young and she was referred to a mental health facility Uncle (paternal) - possible schizophrenia, patient reports that his family suspected that his uncle may have been schizophrenic because he would see things   Family history of suicide: Patient denies, but states that his mother's, who was adopted, cousin attempted suicide.  Patient states that they were not blood related. Family history of homicide: Patient denies Family history of substance abuse: Patient denies  Family History:  Family History  Problem Relation Age of Onset   Stroke Mother    Diabetes Mother        pre-diabetes   Heart disease Mother        CHF, pacer   Cancer Mother        lymphoma   Stomach cancer Mother    Clotting disorder Mother    Coronary artery disease Father 70       MI   Hypertension Father    Hyperlipidemia Father    Diabetes Father    Heart attack Father    Migraines Sister    Stroke Paternal Grandmother    Diabetes Paternal Grandmother    Heart disease Paternal Grandfather    Parkinson's disease Paternal Grandfather    Heart attack Paternal Grandfather     Social History:  Social History   Socioeconomic History   Marital status: Single    Spouse name: Not on file   Number of children: 0   Years of education: Not on file   Highest education level: Some college, no degree  Occupational History   Not on file  Tobacco Use   Smoking status: Never    Passive exposure: Current   Smokeless tobacco: Never   Tobacco comments:    Living in homeless shelter  Vaping Use   Vaping status: Never Used  Substance and Sexual Activity   Alcohol use: Not Currently    Comment: occasionally   Drug use: Never   Sexual activity: Not on file  Other Topics Concern   Not on file  Social History  Narrative   Caffeine: occasional, lives at a shelter ([facility])  Occupation: works at [employer] part timeEdu: some college Diet: some water, some fruits/vegetables, 1-2x/wk red meat, fish 1x/wk   Social Drivers of Corporate investment banker Strain: High Risk (03/06/2023)   Overall Financial Resource Strain (CARDIA)    Difficulty of Paying Living Expenses: Very hard  Food Insecurity: Food Insecurity Present (03/06/2023)   Hunger Vital Sign    Worried About Running Out of Food in the Last Year: Sometimes true    Ran Out of Food in the Last Year: Often true  Transportation Needs: Unmet Transportation Needs (03/06/2023)   PRAPARE - Transportation    Lack of Transportation (Medical): Yes    Lack of Transportation (Non-Medical): Yes  Physical Activity: Inactive (03/06/2023)  Exercise Vital Sign    Days of Exercise per Week: 0 days    Minutes of Exercise per Session: 20 min  Stress: Stress Concern Present (03/06/2023)   [employer] of Occupational Health - Occupational Stress Questionnaire    Feeling of Stress : To some extent  Social Connections: Moderately Integrated (03/06/2023)   Social Connection and Isolation Panel [NHANES]    Frequency of Communication with Friends and Family: More than three times a week    Frequency of Social Gatherings with Friends and Family: Twice a week    Attends Religious Services: More than 4 times per year    Active Member of [employer] or Organizations: Yes    Attends Banker Meetings: More than 4 times per year    Marital Status: Never married    Allergies:  Allergies  Allergen Reactions   Shellfish Allergy Nausea Only    Metabolic Disorder Labs: Lab Results  Component Value Date   HGBA1C 5.8 (H) 11/19/2022   MPG 119.76 11/19/2022   MPG 125.5 10/09/2020   No results found for: PROLACTIN Lab Results  Component Value Date   CHOL 153 12/24/2020   TRIG 126 12/24/2020   HDL 42 12/24/2020   CHOLHDL 3.6 12/24/2020   LDLCALC 88  12/24/2020   Lab Results  Component Value Date   TSH 2.290 12/24/2020   TSH 2.650 10/09/2020    Therapeutic Level Labs: No results found for: LITHIUM No results found for: VALPROATE No results found for: CBMZ  Current Medications: Current Outpatient Medications  Medication Sig Dispense Refill   albuterol  (VENTOLIN  HFA) 108 (90 Base) MCG/ACT inhaler Inhale 1-2 puffs into the lungs every 6 (six) hours as needed for wheezing or shortness of breath. 18 g 3   busPIRone  (BUSPAR ) 7.5 MG tablet Take 1 tablet (7.5 mg total) by mouth 2 (two) times daily. 60 tablet 2   fluticasone  (FLONASE ) 50 MCG/ACT nasal spray Spray 2 sprays into both nostrils once daily. 16 g 2   fluticasone -salmeterol (ADVAIR  HFA) 115-21 MCG/ACT inhaler Inhale 2 puffs into the lungs 2 (two) times daily. 12 g 12   meloxicam  (MOBIC ) 15 MG tablet Take 1 tablet (15 mg total) by mouth daily. 30 tablet 2   Multiple Vitamin (MULTIVITAMIN PO) Take by mouth.     potassium chloride  SA (KLOR-CON  M) 20 MEQ tablet Take 1 tablet (20 mEq total) by mouth daily for 3 days, THEN 1 tablet (20 mEq total) as directed. (Patient not taking: Reported on 07/28/2023) 30 tablet 1   sertraline  (ZOLOFT ) 100 MG tablet Take 1.5 tablets (150 mg total) by mouth at bedtime. 45 tablet 2   torsemide  (DEMADEX ) 20 MG tablet Take 2 tablets (40 mg total) by mouth 2 (two) times daily. 360 tablet 3   traZODone  (DESYREL ) 50 MG tablet Take 1 tablet (50 mg total) by mouth at bedtime. 30 tablet 2   Current Facility-Administered Medications  Medication Dose Route Frequency Provider Last Rate Last Admin   cholecalciferol (VITAMIN D3) tablet 1,000 Units  1,000 Units Oral Daily Regalado, Belkys A, MD         Musculoskeletal: Strength & Muscle Tone: within normal limits Gait & Station: Patient ambulates with a cane Patient leans: N/A  Psychiatric Specialty Exam: Review of Systems  Psychiatric/Behavioral:  Positive for dysphoric mood and sleep disturbance.  Negative for decreased concentration, hallucinations, self-injury and suicidal ideas. The patient is nervous/anxious. The patient is not hyperactive.     Blood pressure 125/64, pulse 77, temperature 98 F (  36.7 C), temperature source Oral, height 6' 2 (1.88 m), weight (!) 412 lb 6.4 oz (187.1 kg), SpO2 95%.Body mass index is 52.95 kg/m.  General Appearance: Casual  Eye Contact:  Good  Speech:  Clear and Coherent and Normal Rate  Volume:  Normal  Mood:  Anxious and Depressed  Affect:  Appropriate  Thought Process:  Coherent, Goal Directed, and Descriptions of Associations: Intact  Orientation:  Full (Time, Place, and Person)  Thought Content: WDL   Suicidal Thoughts:  No  Homicidal Thoughts:  No  Memory:  Immediate;   Good Recent;   Good Remote;   Fair  Judgement:  Fair  Insight:  Fair  Psychomotor Activity:  Normal  Concentration:  Concentration: Good and Attention Span: Good  Recall:  Good  Fund of Knowledge: Fair  Language: Good  Akathisia:  No  Handed:  Left  AIMS (if indicated): not done  Assets:  Communication Skills Desire for Improvement Housing Social Support  ADL's:  Intact  Cognition: WNL  Sleep:  Patient has a history of sleep apnea. Patient reports receiving on average 7 - 8 hours of sleep per night    Screenings: AUDIT    Flowsheet Row Office Visit from 07/05/2022 in [facility]  Alcohol Use Disorder Identification Test Final Score (AUDIT) 3      GAD-7    Flowsheet Row Clinical Support from 08/04/2023 in [facility] Counselor from 07/11/2023 in [facility] Clinical Support from 06/16/2023 in [facility] Clinical Support from 04/14/2023 in [facility] Clinical Support from 02/17/2023 in [facility]  Total GAD-7 Score 14 12 13 14 13       PHQ2-9    Flowsheet Row Clinical Support from  08/04/2023 in [facility] Clinical Support from 06/16/2023 in [facility] Clinical Support from 04/14/2023 in [facility] Office Visit from 03/13/2023 in [facility] at [facility] Clinical Support from 02/17/2023 in [facility]  PHQ-2 Total Score 1 1 1 1 1   PHQ-9 Total Score -- -- -- 5 --      Flowsheet Row Clinical Support from 08/04/2023 in [facility] from 07/08/2023 in [facility] Clinical Support from 06/16/2023 in [facility]  C-SSRS RISK CATEGORY No Risk No Risk No Risk        Assessment and Plan:   Christian [name] is a 48 year old, Caucasian male with a past psychiatric history significant for anxiety and major depressive disorder who presents to [facility] via virtual video visit for follow-up and medication management.  Patient presents to the encounter stating that he has been taking his medications regularly.  He denies any major concerns regarding the use of his medications; however, he does report that if he does not take his medication with water then he will experience heartburn, nausea, and upset stomach.  Patient continues to endorse some depression and anxiety attributed to current stressors in his life.  Despite his stressors, patient reports that his medications keep him stable.  A GAD-7 screen was performed with the patient scoring a 14.  Patient denies the need for dosage adjustments at this time and would like to continue taking his medications as prescribed.  Patient's medications to be e-prescribed to pharmacy of choice.  Patient denies  suicidal ideations and is able to contract for safety following the conclusion of the encounter.  Collaboration of Care: Collaboration of Care: Medication Management AEB provider managing  patient's psychiatric medications, Psychiatrist AEB patient being seen by mental health provider at this facility, and Referral or follow-up with counselor/therapist AEB patient being seen by a licensed clinical social worker at this facility  Patient/Guardian was advised Release of Information must be obtained prior to any record release in order to collaborate their care with an outside provider. Patient/Guardian was advised if they have not already done so to contact the registration department to sign all necessary forms in order for us  to release information regarding their care.   Consent: Patient/Guardian gives verbal consent for treatment and assignment of benefits for services provided during this visit. Patient/Guardian expressed understanding and agreed to proceed.   1. MDD (major depressive disorder), recurrent episode, moderate (HCC)  - sertraline  (ZOLOFT ) 100 MG tablet; Take 1.5 tablets (150 mg total) by mouth at bedtime.  Dispense: 45 tablet; Refill: 2 - traZODone  (DESYREL ) 50 MG tablet; Take 1 tablet (50 mg total) by mouth at bedtime.  Dispense: 30 tablet; Refill: 2  2. GAD (generalized anxiety disorder)  - sertraline  (ZOLOFT ) 100 MG tablet; Take 1.5 tablets (150 mg total) by mouth at bedtime.  Dispense: 45 tablet; Refill: 2 - busPIRone  (BUSPAR ) 7.5 MG tablet; Take 1 tablet (7.5 mg total) by mouth 2 (two) times daily.  Dispense: 60 tablet; Refill: 2  Patient to follow-up in 2 months Provider spent a total of 23 minutes with the patient/reviewing patient's chart  Gates Kasal, PA 08/04/2023, 9:41 PM

## 2023-08-10 ENCOUNTER — Other Ambulatory Visit (HOSPITAL_COMMUNITY): Payer: Self-pay

## 2023-08-10 ENCOUNTER — Other Ambulatory Visit: Payer: Self-pay

## 2023-08-11 ENCOUNTER — Other Ambulatory Visit: Payer: Self-pay

## 2023-08-14 ENCOUNTER — Ambulatory Visit (INDEPENDENT_AMBULATORY_CARE_PROVIDER_SITE_OTHER): Payer: MEDICAID | Admitting: Licensed Clinical Social Worker

## 2023-08-14 DIAGNOSIS — F411 Generalized anxiety disorder: Secondary | ICD-10-CM

## 2023-08-14 DIAGNOSIS — F331 Major depressive disorder, recurrent, moderate: Secondary | ICD-10-CM

## 2023-08-14 NOTE — Progress Notes (Signed)
 THERAPIST PROGRESS NOTE  Virtual Visit via Video Note  I connected with Christian [name] on 08/14/23 at 11:00 AM EDT by a video enabled telemedicine application and verified that I am speaking with the correct person using two identifiers.  Location: Patient: [name]  Provider: Providers Home    I discussed the limitations of evaluation and management by telemedicine and the availability of in person appointments. The patient expressed understanding and agreed to proceed.     I discussed the assessment and treatment plan with the patient. The patient was provided an opportunity to ask questions and all were answered. The patient agreed with the plan and demonstrated an understanding of the instructions.   The patient was advised to call back or seek an in-person evaluation if the symptoms worsen or if the condition fails to improve as anticipated.  I provided 30 minutes of non-face-to-face time during this encounter.   Maryagnes Small, LCSW   Participation Level: Active  Behavioral Response: CasualAlertAnxious  Type of Therapy: Individual Therapy  Treatment Goals addressed:  Active     Anxiety     LTG: Christian [name] will score less than 5 on the Generalized Anxiety Disorder 7 Scale (GAD-7)  (Completed/Met)     Start:  05/11/22    Expected End:  10/28/22    Resolved:  08/17/22    Goal Note     Scpre taken today and last session scored below a 5 each time          STG: Christian [name] will complete at least 80% of assigned homework  (Progressing)     Start:  05/11/22    Expected End:  09/01/23         STG: Christian [name] will practice problem solving skills 3 times per week for the next 4 weeks.  (Progressing)     Start:  05/11/22    Expected End:  09/01/23         identify 3 trigger for anxiety  (Progressing)     Start:  05/11/22    Expected End:  09/01/23         create  3 coping skills for depression  (Progressing)     Start:  05/11/22    Expected End:  09/01/23          Discuss risks and benefits of medication treatment options for this problem and prescribe as indicated (Completed)     Start:  05/11/22    End:  07/11/23      Encourage Christian [name] to take psychotropic medication(s) as prescribed (Completed)     Start:  05/11/22    End:  07/11/23      Review results of GAD-7 with Christian [name] to track progress (Completed)     Start:  05/11/22    End:  06/28/22    Intervention Note     Reviewed with patient during session.          Work with Christian [name] to track symptoms, triggers, and/or skill use through a mood chart, diary card, or journal (Completed)     Start:  05/11/22    End:  06/07/22      Perform psychoeducation regarding anxiety disorders (Completed)     Start:  05/11/22    End:  06/07/22      Provide Christian [name] with educational information and reading material on anxiety, its causes, and symptoms.  (Completed)     Start:  05/11/22    End:  06/07/22      Work with patient  individually to identify the major components of a recent episode of anxiety: physical symptoms, major thoughts and images, and major behaviors they experienced (Completed)     Start:  05/11/22    End:  06/07/22      Work with Christian [name] to identify 3 personal goals for managing their anxiety to work on during current treatment.  (Completed)     Start:  05/11/22    End:  06/28/22      Work with Christian [name] to identify a minimum of 3 consequences of avoidance.  (Completed)     Start:  05/11/22    End:  06/28/22      Work with Christian [name] to identify a minimum of 3 alternative coping behaviors to avoidance.  (Completed)     Start:  05/11/22    End:  07/11/23      Create a weekly activity schedule (Completed)     Start:  05/11/22    End:  07/11/23        OP Depression     LTG: Reduce frequency, intensity, and duration of depression symptoms so that daily functioning is improved (Progressing)     Start:  05/11/22    Expected End:  09/01/23         LTG: Increase  coping skills to manage depression and improve ability to perform daily activities (Progressing)     Start:  05/11/22    Expected End:  09/01/23         LTG: Christian [name] will score less than 10 on the Patient Health Questionnaire (PHQ-9)  (Completed/Met)     Start:  05/11/22    Expected End:  10/28/22    Resolved:  08/17/22    Goal Note     Scpre taken today and last session scored below a 5 each time          Work with Christian [name] to track symptoms, triggers, and/or skill use through a mood chart, diary card, or journal (Completed)     Start:  05/11/22    End:  06/07/22      Therapist will administer the PHQ-9 at weekly intervals for the next 8 weeks (Completed)     Start:  05/11/22    End:  06/28/22      Work with Christian [name] to identify the major components of a recent episode of depression: physical symptoms, major thoughts and images, and major behaviors they experienced (Completed)     Start:  05/11/22    End:  06/07/22         ProgressTowards Goals: Progressing  Interventions: CBT, Motivational Interviewing, and Supportive   Suicidal/Homicidal: Nowithout intent/plan  Therapist Response:    Pt was alert and oriented x 5. He was pleasant, cooperative and maintained good eye contact. He presented with euthymic mood and appropriate affect. He engaged well in therapy session and was dressed casually.   Pt came in switching from in person to virtual. He was at a local bus stop due to transportation issues. He was grateful for LCSW flexibility. Christian [name] endorses symptoms for tension, worry, worthlessness, and hopelessness. He is discouraged still being the shelter stating, It is time to go. Christian [name] is at a standstill with finding housing due to SSI application pending. He has an appt with SSDI and his lawyer on July 11th. Christian [name] has resources and several options for housing if he can gain income.   Pt has good support through shelter and a local support group. He continues to engage  with his education as  he is taking class at a local community college. Christian [name] would like paperwork filled out for extra help at school. LCSW asked pt to send it via email or drop it off and it can be reviewed by LCSW and medication provider.   Intervention/Plan:  LCSW used psychoanalytic therapy for pt to express thoughts, feelings, and concerns in nonjudgmental environment. LCSW used person centered therapy for empowerment. LCSW spoke with pt about symptoms for Dx for depression such as worthlessness and hopelessness feelings. LCSW provided pt with motivational interviewing and educated him on the use of positive affirmations to decrease depression symptoms. LCSW and pt spoke about the stages of grief and loss due to recent holiday for Father's Day and his father being gone.     Plan: Return again in 4 weeks.  Diagnosis: GAD (generalized anxiety disorder)  MDD (major depressive disorder), recurrent episode, moderate (HCC)  Collaboration of Care: Other None today   Patient/Guardian was advised Release of Information must be obtained prior to any record release in order to collaborate their care with an outside provider. Patient/Guardian was advised if they have not already done so to contact the registration department to sign all necessary forms in order for us  to release information regarding their care.   Consent: Patient/Guardian gives verbal consent for treatment and assignment of benefits for services provided during this visit. Patient/Guardian expressed understanding and agreed to proceed.   Maryagnes Small, LCSW 08/14/2023

## 2023-09-06 ENCOUNTER — Ambulatory Visit (HOSPITAL_COMMUNITY): Payer: MEDICAID | Admitting: Licensed Clinical Social Worker

## 2023-09-07 ENCOUNTER — Telehealth (HOSPITAL_COMMUNITY): Payer: Self-pay

## 2023-09-07 ENCOUNTER — Other Ambulatory Visit: Payer: Self-pay | Admitting: Family Medicine

## 2023-09-07 ENCOUNTER — Ambulatory Visit: Payer: MEDICAID | Admitting: Pulmonary Disease

## 2023-09-07 ENCOUNTER — Other Ambulatory Visit (HOSPITAL_COMMUNITY): Payer: Self-pay

## 2023-09-07 VITALS — BP 115/72 | HR 69 | Ht 74.0 in | Wt >= 6400 oz

## 2023-09-07 DIAGNOSIS — G4733 Obstructive sleep apnea (adult) (pediatric): Secondary | ICD-10-CM

## 2023-09-07 DIAGNOSIS — J45909 Unspecified asthma, uncomplicated: Secondary | ICD-10-CM

## 2023-09-07 DIAGNOSIS — M25569 Pain in unspecified knee: Secondary | ICD-10-CM | POA: Diagnosis not present

## 2023-09-07 DIAGNOSIS — I5032 Chronic diastolic (congestive) heart failure: Secondary | ICD-10-CM

## 2023-09-07 DIAGNOSIS — E66813 Obesity, class 3: Secondary | ICD-10-CM

## 2023-09-07 DIAGNOSIS — Z8679 Personal history of other diseases of the circulatory system: Secondary | ICD-10-CM

## 2023-09-07 DIAGNOSIS — Z87891 Personal history of nicotine dependence: Secondary | ICD-10-CM

## 2023-09-07 DIAGNOSIS — Z6841 Body Mass Index (BMI) 40.0 and over, adult: Secondary | ICD-10-CM

## 2023-09-07 DIAGNOSIS — Z8659 Personal history of other mental and behavioral disorders: Secondary | ICD-10-CM

## 2023-09-07 MED ORDER — FLUTICASONE-SALMETEROL 230-21 MCG/ACT IN AERO
2.0000 | INHALATION_SPRAY | Freq: Two times a day (BID) | RESPIRATORY_TRACT | 12 refills | Status: AC
  Filled 2023-09-07 (×2): qty 12, 30d supply, fill #0
  Filled 2023-10-05: qty 12, 30d supply, fill #1
  Filled 2023-11-05: qty 12, 30d supply, fill #2
  Filled 2023-12-05: qty 12, 30d supply, fill #3
  Filled 2024-01-05: qty 12, 30d supply, fill #4
  Filled 2024-02-03 – 2024-02-08 (×2): qty 12, 30d supply, fill #5
  Filled 2024-03-20: qty 12, 30d supply, fill #6

## 2023-09-07 NOTE — Patient Instructions (Signed)
 Follow-up in about 4 months  Continue using your CPAP on a nightly basis  Will send in a prescription to increase the strength of the Advair  that you are using  Continue using albuterol  as needed  Continue weight loss efforts  Call us  with significant concerns  Ensure that you continue to exercise on a regular basis

## 2023-09-07 NOTE — Progress Notes (Signed)
 Christian [name]    [id number]    03-27-1975  Primary Care Physician:Wilson, Raguel, MD  Referring Physician: Tanda Raguel, MD [address] [place],  [place] [address]  Chief complaint:   Patient being seen for sleep apnea, shortness of breath on exertion  HPI:  Patient with sleep apnea Compliant with CPAP use - Not having significant problems with her CPAP  History of asthma - Uses Advair  on a regular basis - Increased wheezing - Increased cough - Compliant with Advair  - Increased albuterol  use  Class III obesity Limited by osteoarthritis - About 100 yards of walking at most - Encouraged to continue working on weight loss efforts  History of anxiety - On multiple medications, recently started BuSpar  along with other meds  Past pneumonia with most recent CT stable from previous  History of respiratory failure History of diastolic heart failure  Complicated course in 2022 with respiratory failure, influenza, pneumonia, sepsis, decubitus ulcer most recent hospitalization was in September 2024  Never smoked, exposure to second hand smoke because of current living situation  Overall he is doing well   Outpatient Encounter Medications as of 09/07/2023  Medication Sig   albuterol  (VENTOLIN  HFA) 108 (90 Base) MCG/ACT inhaler Inhale 1-2 puffs into the lungs every 6 (six) hours as needed for wheezing or shortness of breath.   busPIRone  (BUSPAR ) 7.5 MG tablet Take 1 tablet (7.5 mg total) by mouth 2 (two) times daily.   fluticasone  (FLONASE ) 50 MCG/ACT nasal spray Spray 2 sprays into both nostrils once daily.   fluticasone -salmeterol (ADVAIR  HFA) 115-21 MCG/ACT inhaler Inhale 2 puffs into the lungs 2 (two) times daily.   meloxicam  (MOBIC ) 15 MG tablet Take 1 tablet (15 mg total) by mouth daily.   Multiple Vitamin (MULTIVITAMIN PO) Take by mouth.   sertraline  (ZOLOFT ) 100 MG tablet Take 1.5 tablets (150 mg total) by mouth at bedtime.   torsemide   (DEMADEX ) 20 MG tablet Take 2 tablets (40 mg total) by mouth 2 (two) times daily.   traZODone  (DESYREL ) 50 MG tablet Take 1 tablet (50 mg total) by mouth at bedtime.   potassium chloride  SA (KLOR-CON  M) 20 MEQ tablet Take 1 tablet (20 mEq total) by mouth daily for 3 days, THEN 1 tablet (20 mEq total) as directed. (Patient not taking: Reported on 07/28/2023)   Facility-Administered Encounter Medications as of 09/07/2023  Medication   cholecalciferol (VITAMIN D3) tablet 1,000 Units    Allergies as of 09/07/2023 - Review Complete 09/07/2023  Allergen Reaction Noted   Shellfish allergy Nausea Only 12/08/2020    Past Medical History:  Diagnosis Date   Allergic rhinitis    Anxiety    CHF (congestive heart failure) (HCC)    Depression    Depression    Elevated blood pressure reading without diagnosis of hypertension    History of chicken pox    Obesity    Perforated bowel (HCC)    Pleural effusion    Sepsis (HCC)    Sleep apnea    O2 at night    Past Surgical History:  Procedure Laterality Date   COLON SURGERY     perforated bowel   TONSILLECTOMY AND ADENOIDECTOMY  03/01/1983    Family History  Problem Relation Age of Onset   Stroke Mother    Diabetes Mother        pre-diabetes   Heart disease Mother        CHF, pacer   Cancer Mother  lymphoma   Stomach cancer Mother    Clotting disorder Mother    Coronary artery disease Father 35       MI   Hypertension Father    Hyperlipidemia Father    Diabetes Father    Heart attack Father    Migraines Sister    Stroke Paternal Grandmother    Diabetes Paternal Grandmother    Heart disease Paternal Grandfather    Parkinson's disease Paternal Grandfather    Heart attack Paternal Grandfather     Social History   Socioeconomic History   Marital status: Single    Spouse name: Not on file   Number of children: 0   Years of education: Not on file   Highest education level: Some college, no degree  Occupational History    Not on file  Tobacco Use   Smoking status: Never    Passive exposure: Current   Smokeless tobacco: Never   Tobacco comments:    Living in homeless shelter  Vaping Use   Vaping status: Never Used  Substance and Sexual Activity   Alcohol use: Not Currently    Comment: occasionally   Drug use: Never   Sexual activity: Not on file  Other Topics Concern   Not on file  Social History Narrative   Caffeine: occasional, lives at a shelter ([facility])  Occupation: works at [employer] part timeEdu: some college Diet: some water, some fruits/vegetables, 1-2x/wk red meat, fish 1x/wk   Social Drivers of Corporate investment banker Strain: High Risk (03/06/2023)   Overall Financial Resource Strain (CARDIA)    Difficulty of Paying Living Expenses: Very hard  Food Insecurity: Food Insecurity Present (03/06/2023)   Hunger Vital Sign    Worried About Running Out of Food in the Last Year: Sometimes true    Ran Out of Food in the Last Year: Often true  Transportation Needs: Unmet Transportation Needs (03/06/2023)   PRAPARE - Administrator, Civil Service (Medical): Yes    Lack of Transportation (Non-Medical): Yes  Physical Activity: Unknown (03/06/2023)   Exercise Vital Sign    Days of Exercise per Week: 0 days    Minutes of Exercise per Session: Not on file  Recent Concern: Physical Activity - Inactive (03/06/2023)   Exercise Vital Sign    Days of Exercise per Week: 0 days    Minutes of Exercise per Session: 20 min  Stress: Stress Concern Present (03/06/2023)   [employer] of Occupational Health - Occupational Stress Questionnaire    Feeling of Stress : To some extent  Social Connections: Moderately Integrated (03/06/2023)   Social Connection and Isolation Panel    Frequency of Communication with Friends and Family: More than three times a week    Frequency of Social Gatherings with Friends and Family: Twice a week    Attends Religious Services: More than 4 times per year     Active Member of [employer] or Organizations: Yes    Attends Banker Meetings: More than 4 times per year    Marital Status: Never married  Intimate Partner Violence: Not At Risk (03/13/2023)   Humiliation, Afraid, Rape, and Kick questionnaire    Fear of Current or Ex-Partner: No    Emotionally Abused: No    Physically Abused: No    Sexually Abused: No    Review of Systems  Constitutional:  Positive for fatigue.  Respiratory:  Positive for apnea, shortness of breath and wheezing.   Psychiatric/Behavioral:  Positive for sleep  disturbance.     Vitals:   09/07/23 0837  BP: 115/72  Pulse: 69  SpO2: 97%     Physical Exam Constitutional:      Appearance: He is obese.  HENT:     Head: Normocephalic.     Mouth/Throat:     Mouth: Mucous membranes are moist.     Comments: Mallampati 4, crowded oropharynx Eyes:     General: No scleral icterus. Cardiovascular:     Rate and Rhythm: Normal rate and regular rhythm.     Heart sounds: No murmur heard.    No friction rub.  Pulmonary:     Effort: No respiratory distress.     Breath sounds: No stridor. No wheezing or rhonchi.  Musculoskeletal:     Cervical back: No rigidity or tenderness.  Neurological:     Mental Status: He is alert.  Psychiatric:        Mood and Affect: Mood normal.    Data Reviewed: Last office note was reviewed  Most recent CT scan reviewed showing nodules that have been stable with follow-up  Echocardiogram 06/22/2022 with normal ejection fraction, diastolic dysfunction  PFT October 30, 2022 with obstructive physiology  CPAP compliance reviewed showing excellent compliance CPAP of 12 Residual AHI of 1.1 -No significant mask leaks  Assessment:  Obstructive sleep apnea - Compliant with CPAP - CPAP download shows excellent compliance and efficiency  Class III obesity - Encouraged to continue working on weight loss efforts  Asthma with uncontrolled symptoms at present - Increase dose of  Advair  - Continue albuterol  as needed  History of diastolic dysfunction - Modify risks as possible  Musculoskeletal pain and dysfunction Chronic knee pain -This is obviously contributing to his limitations  History of depression and anxiety - Medications been optimized  Plan/Recommendations: Encouraged to exercise regularly  Focus on weight loss efforts  Continue Advair , dose of Advair  increased from 115-230  Continue CPAP nightly  Follow-up in about 4 months  Encouraged to call with significant concerns   Jennet Epley MD [facility] 09/07/2023, 9:01 AM  CC: Tanda Bleacher, MD

## 2023-09-07 NOTE — Telephone Encounter (Signed)
 PA request has been Received. New Encounter has been or will be created for follow up. For additional info see Pharmacy Prior Auth telephone encounter from 09/07/23.

## 2023-09-08 ENCOUNTER — Other Ambulatory Visit (HOSPITAL_COMMUNITY): Payer: Self-pay

## 2023-09-08 ENCOUNTER — Other Ambulatory Visit: Payer: Self-pay

## 2023-09-08 MED ORDER — FLUTICASONE PROPIONATE 50 MCG/ACT NA SUSP
2.0000 | Freq: Every day | NASAL | 2 refills | Status: DC
  Filled 2023-09-08: qty 16, 30d supply, fill #0
  Filled 2023-10-05: qty 16, 30d supply, fill #1
  Filled 2023-11-05: qty 16, 30d supply, fill #2

## 2023-09-12 ENCOUNTER — Ambulatory Visit: Payer: MEDICAID | Admitting: Family Medicine

## 2023-09-12 ENCOUNTER — Encounter: Payer: Self-pay | Admitting: Family Medicine

## 2023-09-12 VITALS — BP 132/80 | HR 67 | Ht 74.0 in | Wt >= 6400 oz

## 2023-09-12 DIAGNOSIS — Z23 Encounter for immunization: Secondary | ICD-10-CM | POA: Diagnosis not present

## 2023-09-12 DIAGNOSIS — E66813 Obesity, class 3: Secondary | ICD-10-CM | POA: Diagnosis not present

## 2023-09-12 DIAGNOSIS — I10 Essential (primary) hypertension: Secondary | ICD-10-CM

## 2023-09-12 DIAGNOSIS — Z5900 Homelessness unspecified: Secondary | ICD-10-CM

## 2023-09-12 DIAGNOSIS — M25561 Pain in right knee: Secondary | ICD-10-CM

## 2023-09-12 DIAGNOSIS — M25562 Pain in left knee: Secondary | ICD-10-CM

## 2023-09-12 DIAGNOSIS — Z6841 Body Mass Index (BMI) 40.0 and over, adult: Secondary | ICD-10-CM

## 2023-09-12 NOTE — Progress Notes (Unsigned)
 Established Patient Office Visit  Subjective    Patient ID: Christian [name], male    DOB: [birth year]  Age: 48 y.o. MRN: [id number]  CC:  Chief Complaint  Patient presents with   Medical Management of Chronic Issues    HPI Christian [name] presents for routine follow up of chronic med issues including hypertension and chronic knee pain. Patient also reports that he had COVID  agin after he last saw me but that those symptoms have resolved. Patient denies acute complaints. Patient remains homeless but recently had his disability hearing.   Outpatient Encounter Medications as of 09/12/2023  Medication Sig   albuterol  (VENTOLIN  HFA) 108 (90 Base) MCG/ACT inhaler Inhale 1-2 puffs into the lungs every 6 (six) hours as needed for wheezing or shortness of breath.   busPIRone  (BUSPAR ) 7.5 MG tablet Take 1 tablet (7.5 mg total) by mouth 2 (two) times daily.   fluticasone  (FLONASE ) 50 MCG/ACT nasal spray Spray 2 sprays into both nostrils once daily.   fluticasone -salmeterol (ADVAIR  HFA) 230-21 MCG/ACT inhaler Inhale 2 puffs into the lungs 2 (two) times daily.   meloxicam  (MOBIC ) 15 MG tablet Take 1 tablet (15 mg total) by mouth daily.   Multiple Vitamin (MULTIVITAMIN PO) Take by mouth.   sertraline  (ZOLOFT ) 100 MG tablet Take 1.5 tablets (150 mg total) by mouth at bedtime.   torsemide  (DEMADEX ) 20 MG tablet Take 2 tablets (40 mg total) by mouth 2 (two) times daily.   traZODone  (DESYREL ) 50 MG tablet Take 1 tablet (50 mg total) by mouth at bedtime.   [DISCONTINUED] potassium chloride  SA (KLOR-CON  M) 20 MEQ tablet Take 1 tablet (20 mEq total) by mouth daily for 3 days, THEN 1 tablet (20 mEq total) as directed. (Patient not taking: Reported on 07/28/2023)   Facility-Administered Encounter Medications as of 09/12/2023  Medication   cholecalciferol (VITAMIN D3) tablet 1,000 Units    Past Medical History:  Diagnosis Date   Allergic rhinitis    Anxiety    CHF (congestive heart failure) (HCC)     Depression    Depression    Elevated blood pressure reading without diagnosis of hypertension    History of chicken pox    Obesity    Perforated bowel (HCC)    Pleural effusion    Sepsis (HCC)    Sleep apnea    O2 at night    Past Surgical History:  Procedure Laterality Date   COLON SURGERY     perforated bowel   TONSILLECTOMY AND ADENOIDECTOMY  03/01/1983    Family History  Problem Relation Age of Onset   Stroke Mother    Diabetes Mother        pre-diabetes   Heart disease Mother        CHF, pacer   Cancer Mother        lymphoma   Stomach cancer Mother    Clotting disorder Mother    Coronary artery disease Father 30       MI   Hypertension Father    Hyperlipidemia Father    Diabetes Father    Heart attack Father    Migraines Sister    Stroke Paternal Grandmother    Diabetes Paternal Grandmother    Heart disease Paternal Grandfather    Parkinson's disease Paternal Grandfather    Heart attack Paternal Grandfather     Social History   Socioeconomic History   Marital status: Single    Spouse name: Not on file   Number of children: 0  Years of education: Not on file   Highest education level: Some college, no degree  Occupational History   Not on file  Tobacco Use   Smoking status: Never    Passive exposure: Current   Smokeless tobacco: Never   Tobacco comments:    Living in homeless shelter  Vaping Use   Vaping status: Never Used  Substance and Sexual Activity   Alcohol use: Not Currently    Comment: occasionally   Drug use: Never   Sexual activity: Not on file  Other Topics Concern   Not on file  Social History Narrative   Caffeine: occasional, lives at a shelter ([facility])  Occupation: works at [employer] part timeEdu: some college Diet: some water, some fruits/vegetables, 1-2x/wk red meat, fish 1x/wk   Social Drivers of Corporate investment banker Strain: Medium Risk (09/08/2023)   Overall Financial Resource Strain (CARDIA)     Difficulty of Paying Living Expenses: Somewhat hard  Food Insecurity: Food Insecurity Present (09/08/2023)   Hunger Vital Sign    Worried About Running Out of Food in the Last Year: Often true    Ran Out of Food in the Last Year: Often true  Transportation Needs: Unmet Transportation Needs (09/08/2023)   PRAPARE - Transportation    Lack of Transportation (Medical): Yes    Lack of Transportation (Non-Medical): Yes  Physical Activity: Inactive (09/08/2023)   Exercise Vital Sign    Days of Exercise per Week: 0 days    Minutes of Exercise per Session: Not on file  Stress: Stress Concern Present (09/08/2023)   [employer] of Occupational Health - Occupational Stress Questionnaire    Feeling of Stress: To some extent  Social Connections: Moderately Integrated (09/08/2023)   Social Connection and Isolation Panel    Frequency of Communication with Friends and Family: More than three times a week    Frequency of Social Gatherings with Friends and Family: Three times a week    Attends Religious Services: More than 4 times per year    Active Member of Clubs or Organizations: Yes    Attends Banker Meetings: More than 4 times per year    Marital Status: Never married  Intimate Partner Violence: Not At Risk (03/13/2023)   Humiliation, Afraid, Rape, and Kick questionnaire    Fear of Current or Ex-Partner: No    Emotionally Abused: No    Physically Abused: No    Sexually Abused: No    Review of Systems  All other systems reviewed and are negative.       Objective    BP 132/80   Pulse 67   Ht 6' 2 (1.88 m)   Wt (!) 409 lb 12.8 oz (185.9 kg)   SpO2 93%   BMI 52.62 kg/m   Physical Exam Vitals and nursing note reviewed.  Constitutional:      General: He is not in acute distress.    Appearance: He is obese.  Cardiovascular:     Rate and Rhythm: Normal rate and regular rhythm.  Pulmonary:     Effort: Pulmonary effort is normal.     Breath sounds: Normal breath  sounds.  Abdominal:     Palpations: Abdomen is soft.     Tenderness: There is no abdominal tenderness.  Musculoskeletal:     Right lower leg: No edema.     Left lower leg: No edema.  Neurological:     General: No focal deficit present.     Mental Status: He is  alert and oriented to person, place, and time.  Psychiatric:        Mood and Affect: Mood and affect normal.        Speech: Speech normal.        Behavior: Behavior normal. Behavior is cooperative.         Assessment & Plan:   Essential hypertension  Class 3 severe obesity due to excess calories with serious comorbidity and body mass index (BMI) of 50.0 to 59.9 in adult  Pain in both knees, unspecified chronicity  Homeless  Other orders -     Heplisav-B  (HepB-CPG) Vaccine     Return in about 3 months (around 12/13/2023) for follow up.   Tanda Raguel SQUIBB, MD

## 2023-09-13 ENCOUNTER — Encounter: Payer: Self-pay | Admitting: Family Medicine

## 2023-09-25 ENCOUNTER — Other Ambulatory Visit: Payer: Self-pay

## 2023-09-28 ENCOUNTER — Telehealth: Payer: Self-pay | Admitting: *Deleted

## 2023-09-28 NOTE — Telephone Encounter (Signed)
 Received CMN for PA for CPAP.  Placed in Tammy's sign folder for signature.

## 2023-10-02 ENCOUNTER — Ambulatory Visit (HOSPITAL_COMMUNITY): Payer: MEDICAID | Admitting: Licensed Clinical Social Worker

## 2023-10-04 ENCOUNTER — Encounter: Payer: Self-pay | Admitting: Physician Assistant

## 2023-10-04 ENCOUNTER — Other Ambulatory Visit: Payer: Self-pay

## 2023-10-04 ENCOUNTER — Other Ambulatory Visit: Payer: Self-pay | Admitting: Physician Assistant

## 2023-10-04 MED ORDER — CIPROFLOXACIN-DEXAMETHASONE 0.3-0.1 % OT SUSP
4.0000 [drp] | Freq: Two times a day (BID) | OTIC | 0 refills | Status: DC
Start: 2023-10-04 — End: 2023-12-08
  Filled 2023-10-04: qty 7.5, 19d supply, fill #0

## 2023-10-04 NOTE — Progress Notes (Signed)
 Pt w/ increased cough and DOE recently. Not sure about LE edema or weight gain.   He is compliant w/ his inhalers.  On exam, he has clear lungs, heart RRR, but both ears have a creamy white discharge and erythematous canals.   Exam is c/w otitis externa >> he was given Ciprodex . It was sent to [facility] to make sure he gets it tomorrow.   He was given a box of Mucinex .   He is to continue his other meds, including Flonase .   No other issues or concerns.    Shona Shad, PA-C 10/04/2023 4:22 PM

## 2023-10-05 ENCOUNTER — Other Ambulatory Visit: Payer: Self-pay

## 2023-10-13 ENCOUNTER — Other Ambulatory Visit (HOSPITAL_COMMUNITY): Payer: Self-pay

## 2023-10-13 ENCOUNTER — Ambulatory Visit (INDEPENDENT_AMBULATORY_CARE_PROVIDER_SITE_OTHER): Payer: MEDICAID | Admitting: Physician Assistant

## 2023-10-13 DIAGNOSIS — F331 Major depressive disorder, recurrent, moderate: Secondary | ICD-10-CM | POA: Diagnosis not present

## 2023-10-13 DIAGNOSIS — F411 Generalized anxiety disorder: Secondary | ICD-10-CM | POA: Diagnosis not present

## 2023-10-13 MED ORDER — BUSPIRONE HCL 7.5 MG PO TABS
7.5000 mg | ORAL_TABLET | Freq: Two times a day (BID) | ORAL | 2 refills | Status: DC
  Filled 2023-10-13 – 2023-10-20 (×2): qty 60, 30d supply, fill #0
  Filled 2023-11-22: qty 60, 30d supply, fill #1
  Filled 2023-12-05: qty 60, 30d supply, fill #2

## 2023-10-13 MED ORDER — SERTRALINE HCL 100 MG PO TABS
200.0000 mg | ORAL_TABLET | Freq: Every day | ORAL | 2 refills | Status: DC
  Filled 2023-10-13 – 2023-10-20 (×2): qty 60, 30d supply, fill #0
  Filled 2023-11-22: qty 60, 30d supply, fill #1

## 2023-10-13 NOTE — Progress Notes (Signed)
 [facility] MD/PA/NP OP Progress Note  10/13/2023 11:30 AM Christian [name]  MRN:  [id number]  Chief Complaint:  Chief Complaint  Patient presents with   Follow-up   Medication Management   HPI:   Christian [name] is a 48 year old, Caucasian male with a past psychiatric history significant for anxiety and major depressive disorder who presents to [facility] for follow-up and medication management.  Patient is currently being managed on the following psychiatric medication:   Sertraline  150 mg daily. Trazodone  50 mg daily Buspirone  7.5 mg 2 times daily  Patient reports that he is still at the shelter.  Patient reports that his anxiety flares up when he is at the shelter.  Patient reports that he gets discouraged when having to return to the homeless shelter.  He reports that when he is headed back towards the shelter or riding the bus, he thinks of how he can be making better use of his time.  When out of the homeless shelter, patient reports that he is still going to classes at [employer].  Patient reports that keeping himself busy helps with managing his depression and anxiety.  Patient endorses depression and rates his depression a 3 out of 10 with 10 being most severe.  Patient endorses mild depressive episodes each day of the week.  He reports that he experiences his depression mostly in the evening when he is having to head back to the homeless shelter.  Patient endorses the following depressive symptoms: decreased energy, decreased concentration, and irritability (on occasion).  Patient denies feelings of sadness, lack of motivation, feelings of guilt/worthlessness, or hopelessness.  When his depression is at its worst, patient reports that he occasionally wants to punch the wall in order to release some pent up energy.  Patient also endorses anxiety and rates his anxiety a 5 out of 10.  Patient's current stressors include finding  housing, living at the homeless shelter, and concerns over his disability case.  Patient is alert and oriented x 4, calm, cooperative, and fully engaged in conversation during the encounter.  Patient describes his mood as outgoing.  Patient exhibits depressed mood with appropriate affect.  Patient denies suicidal or homicidal ideations.  He further denies auditory or visual hallucinations and does not appear to be responding to internal/external stimuli.  Patient endorses good sleep and receives on average 6 to 8 hours of sleep per night (patient utilizes a CPAP machine).  Patient endorses good appetite and eats on average 3 meals per day.  Patient endorses alcohol consumption on occasion.  Patient denies tobacco use or illicit drug use.  Visit Diagnosis:    ICD-10-CM   1. MDD (major depressive disorder), recurrent episode, moderate (HCC)  F33.1 sertraline  (ZOLOFT ) 100 MG tablet    2. GAD (generalized anxiety disorder)  F41.1 sertraline  (ZOLOFT ) 100 MG tablet    busPIRone  (BUSPAR ) 7.5 MG tablet       Past Psychiatric History:  Patient reports that when he was first hospitalized due to mental health, he was diagnosed with anxiety and depression. Patient states that he was started on antidepressants during that time but never stayed consistent with his medication management   Past Medical History:  Past Medical History:  Diagnosis Date   Allergic rhinitis    Anxiety    CHF (congestive heart failure) (HCC)    Depression    Depression    Elevated blood pressure reading without diagnosis of hypertension    History of chicken  pox    Obesity    Perforated bowel (HCC)    Pleural effusion    Sepsis (HCC)    Sleep apnea    O2 at night    Past Surgical History:  Procedure Laterality Date   COLON SURGERY     perforated bowel   TONSILLECTOMY AND ADENOIDECTOMY  03/01/1983    Family Psychiatric History:  Mother - patient reports that his mother had cancer when he was young and she was  referred to a mental health facility Uncle (paternal) - possible schizophrenia, patient reports that his family suspected that his uncle may have been schizophrenic because he would see things   Family history of suicide: Patient denies, but states that his mother's, who was adopted, cousin attempted suicide.  Patient states that they were not blood related. Family history of homicide: Patient denies Family history of substance abuse: Patient denies  Family History:  Family History  Problem Relation Age of Onset   Stroke Mother    Diabetes Mother        pre-diabetes   Heart disease Mother        CHF, pacer   Cancer Mother        lymphoma   Stomach cancer Mother    Clotting disorder Mother    Coronary artery disease Father 49       MI   Hypertension Father    Hyperlipidemia Father    Diabetes Father    Heart attack Father    Migraines Sister    Stroke Paternal Grandmother    Diabetes Paternal Grandmother    Heart disease Paternal Grandfather    Parkinson's disease Paternal Grandfather    Heart attack Paternal Grandfather     Social History:  Social History   Socioeconomic History   Marital status: Single    Spouse name: Not on file   Number of children: 0   Years of education: Not on file   Highest education level: Some college, no degree  Occupational History   Not on file  Tobacco Use   Smoking status: Never    Passive exposure: Current   Smokeless tobacco: Never   Tobacco comments:    Living in homeless shelter  Vaping Use   Vaping status: Never Used  Substance and Sexual Activity   Alcohol use: Not Currently    Comment: occasionally   Drug use: Never   Sexual activity: Not on file  Other Topics Concern   Not on file  Social History Narrative   Caffeine: occasional, lives at a shelter ([facility])  Occupation: works at [employer] part timeEdu: some college Diet: some water, some fruits/vegetables, 1-2x/wk red meat, fish 1x/wk   Social Drivers of  Corporate investment banker Strain: Medium Risk (09/08/2023)   Overall Financial Resource Strain (CARDIA)    Difficulty of Paying Living Expenses: Somewhat hard  Food Insecurity: Food Insecurity Present (09/08/2023)   Hunger Vital Sign    Worried About Running Out of Food in the Last Year: Often true    Ran Out of Food in the Last Year: Often true  Transportation Needs: Unmet Transportation Needs (09/08/2023)   PRAPARE - Transportation    Lack of Transportation (Medical): Yes    Lack of Transportation (Non-Medical): Yes  Physical Activity: Inactive (09/08/2023)   Exercise Vital Sign    Days of Exercise per Week: 0 days    Minutes of Exercise per Session: Not on file  Stress: Stress Concern Present (09/08/2023)   [employer]  of Occupational Health - Occupational Stress Questionnaire    Feeling of Stress: To some extent  Social Connections: Moderately Integrated (09/08/2023)   Social Connection and Isolation Panel    Frequency of Communication with Friends and Family: More than three times a week    Frequency of Social Gatherings with Friends and Family: Three times a week    Attends Religious Services: More than 4 times per year    Active Member of Clubs or Organizations: Yes    Attends Banker Meetings: More than 4 times per year    Marital Status: Never married    Allergies:  Allergies  Allergen Reactions   Shellfish Allergy Nausea Only    Metabolic Disorder Labs: Lab Results  Component Value Date   HGBA1C 5.8 (H) 11/19/2022   MPG 119.76 11/19/2022   MPG 125.5 10/09/2020   No results found for: PROLACTIN Lab Results  Component Value Date   CHOL 153 12/24/2020   TRIG 126 12/24/2020   HDL 42 12/24/2020   CHOLHDL 3.6 12/24/2020   LDLCALC 88 12/24/2020   Lab Results  Component Value Date   TSH 2.290 12/24/2020   TSH 2.650 10/09/2020    Therapeutic Level Labs: No results found for: LITHIUM No results found for: VALPROATE No results found  for: CBMZ  Current Medications: Current Outpatient Medications  Medication Sig Dispense Refill   albuterol  (VENTOLIN  HFA) 108 (90 Base) MCG/ACT inhaler Inhale 1-2 puffs into the lungs every 6 (six) hours as needed for wheezing or shortness of breath. 18 g 3   busPIRone  (BUSPAR ) 7.5 MG tablet Take 1 tablet (7.5 mg total) by mouth 2 (two) times daily. 60 tablet 2   ciprofloxacin -dexamethasone  (CIPRODEX ) OTIC suspension Place 4 drops into both ears 2 (two) times daily. 7.5 mL 0   fluticasone  (FLONASE ) 50 MCG/ACT nasal spray Spray 2 sprays into both nostrils once daily. 16 g 2   fluticasone -salmeterol (ADVAIR  HFA) 230-21 MCG/ACT inhaler Inhale 2 puffs into the lungs 2 (two) times daily. 12 g 12   meloxicam  (MOBIC ) 15 MG tablet Take 1 tablet (15 mg total) by mouth daily. 30 tablet 2   Multiple Vitamin (MULTIVITAMIN PO) Take by mouth.     sertraline  (ZOLOFT ) 100 MG tablet Take 2 tablets (200 mg total) by mouth at bedtime. 60 tablet 2   torsemide  (DEMADEX ) 20 MG tablet Take 2 tablets (40 mg total) by mouth 2 (two) times daily. 360 tablet 3   traZODone  (DESYREL ) 50 MG tablet Take 1 tablet (50 mg total) by mouth at bedtime. 30 tablet 2   Current Facility-Administered Medications  Medication Dose Route Frequency Provider Last Rate Last Admin   cholecalciferol (VITAMIN D3) tablet 1,000 Units  1,000 Units Oral Daily Regalado, Belkys A, MD         Musculoskeletal: Strength & Muscle Tone: within normal limits Gait & Station: Patient ambulates with a cane Patient leans: N/A  Psychiatric Specialty Exam: Review of Systems  Psychiatric/Behavioral:  Positive for dysphoric mood. Negative for decreased concentration, hallucinations, self-injury, sleep disturbance and suicidal ideas. The patient is nervous/anxious. The patient is not hyperactive.     Blood pressure 124/77, pulse 77, height 6' 2 (1.88 m), weight (!) 422 lb 12.8 oz (191.8 kg), SpO2 94%.Body mass index is 54.28 kg/m.  General Appearance:  Casual  Eye Contact:  Good  Speech:  Clear and Coherent and Normal Rate  Volume:  Normal  Mood:  Anxious and Depressed  Affect:  Appropriate  Thought Process:  Coherent, Goal  Directed, and Descriptions of Associations: Intact  Orientation:  Full (Time, Place, and Person)  Thought Content: WDL   Suicidal Thoughts:  No  Homicidal Thoughts:  No  Memory:  Immediate;   Good Recent;   Good Remote;   Fair  Judgement:  Fair  Insight:  Fair  Psychomotor Activity:  Normal  Concentration:  Concentration: Good and Attention Span: Good  Recall:  Good  Fund of Knowledge: Fair  Language: Good  Akathisia:  No  Handed:  Left  AIMS (if indicated): not done  Assets:  Communication Skills Desire for Improvement Housing Social Support  ADL's:  Intact  Cognition: WNL  Sleep:  Patient has a history of sleep apnea. Patient reports receiving on average 7 - 8 hours of sleep per night    Screenings: AUDIT    Flowsheet Row Office Visit from 07/05/2022 in [facility]  Alcohol Use Disorder Identification Test Final Score (AUDIT) 3   GAD-7    Flowsheet Row Clinical Support from 10/13/2023 in [facility] Clinical Support from 08/04/2023 in [facility] Counselor from 07/11/2023 in [facility] Clinical Support from 06/16/2023 in [facility] Clinical Support from 04/14/2023 in [facility]  Total GAD-7 Score 14 14 12 13 14    PHQ2-9    Flowsheet Row Clinical Support from 10/13/2023 in [facility] Clinical Support from 08/04/2023 in [facility] Clinical Support from 06/16/2023 in [facility] Clinical Support from 04/14/2023 in [facility] Office Visit from 03/13/2023 in [facility]  PHQ-2 Total Score 1 1  1 1 1   PHQ-9 Total Score -- -- -- -- 5   Flowsheet Row Clinical Support from 10/13/2023 in [facility] Clinical Support from 08/04/2023 in [facility] from 07/08/2023 in [facility] RISK CATEGORY No Risk No Risk No Risk     Assessment and Plan:   Christian [name] is a 48 year old, Caucasian male with a past psychiatric history significant for anxiety and major depressive disorder who presents to [facility] via virtual video visit for follow-up and medication management.  Patient presents to the encounter stating that he continues to take his medications regularly and denies experiencing any adverse side effects.  Patient continues to endorse depression and anxiety attributed to having to live at the homeless shelter.  A PHQ-9 screen was performed with the patient scoring a 1.  A GAD-7 screen was also performed with the patient scoring a 14.  Patient is interested in adjusting his dosage of Zoloft  for the management of his depressive symptoms and anxiety.  Provider recommended increasing patient's Zoloft  dosage from 150 mg to 200 mg daily for the management of his depressive symptoms and anxiety.  Patient is agreeable to recommendation.  Patient to continue taking all other medications as prescribed.  Patient's medications to be e-prescribed to pharmacy of choice.  A [place] Suicide Severity Rating Scale was performed with the patient being considered no risk.  Patient denies suicidal ideations and is able to contract for safety at this time.    Collaboration of Care: Collaboration of Care: Medication Management AEB provider managing patient's psychiatric medications, Psychiatrist AEB patient being seen by mental health provider at this facility, and Referral or follow-up with counselor/therapist AEB  patient being seen by a licensed clinical social worker at this  facility  Patient/Guardian was advised Release of Information must be obtained prior to any record release in order to collaborate their care with an outside provider. Patient/Guardian was advised if they have not already done so to contact the registration department to sign all necessary forms in order for us  to release information regarding their care.   Consent: Patient/Guardian gives verbal consent for treatment and assignment of benefits for services provided during this visit. Patient/Guardian expressed understanding and agreed to proceed.   1. MDD (major depressive disorder), recurrent episode, moderate (HCC)  - sertraline  (ZOLOFT ) 100 MG tablet; Take 2 tablets (200 mg total) by mouth at bedtime.  Dispense: 60 tablet; Refill: 2  2. GAD (generalized anxiety disorder)  - sertraline  (ZOLOFT ) 100 MG tablet; Take 2 tablets (200 mg total) by mouth at bedtime.  Dispense: 60 tablet; Refill: 2 - busPIRone  (BUSPAR ) 7.5 MG tablet; Take 1 tablet (7.5 mg total) by mouth 2 (two) times daily.  Dispense: 60 tablet; Refill: 2  Patient to follow-up in 2 months Provider spent a total of 24 minutes with the patient/reviewing patient's chart  Reginia FORBES Bolster, PA 10/13/2023 11:30 AM

## 2023-10-20 ENCOUNTER — Other Ambulatory Visit (HOSPITAL_COMMUNITY): Payer: Self-pay

## 2023-10-20 ENCOUNTER — Other Ambulatory Visit: Payer: Self-pay

## 2023-10-21 ENCOUNTER — Encounter (HOSPITAL_COMMUNITY): Payer: Self-pay | Admitting: Physician Assistant

## 2023-11-06 ENCOUNTER — Other Ambulatory Visit: Payer: Self-pay

## 2023-11-08 ENCOUNTER — Other Ambulatory Visit: Payer: Self-pay

## 2023-11-08 ENCOUNTER — Emergency Department (HOSPITAL_COMMUNITY)
Admission: EM | Admit: 2023-11-08 | Discharge: 2023-11-08 | Disposition: A | Discharge status: Home / Self Care | Payer: MEDICAID | Attending: Emergency Medicine | Admitting: Emergency Medicine

## 2023-11-08 ENCOUNTER — Encounter (HOSPITAL_COMMUNITY): Payer: Self-pay | Admitting: Emergency Medicine

## 2023-11-08 ENCOUNTER — Emergency Department (HOSPITAL_COMMUNITY): Payer: MEDICAID

## 2023-11-08 DIAGNOSIS — N179 Acute kidney failure, unspecified: Secondary | ICD-10-CM | POA: Diagnosis not present

## 2023-11-08 DIAGNOSIS — Z5900 Homelessness unspecified: Secondary | ICD-10-CM | POA: Insufficient documentation

## 2023-11-08 DIAGNOSIS — B349 Viral infection, unspecified: Secondary | ICD-10-CM | POA: Diagnosis not present

## 2023-11-08 DIAGNOSIS — I509 Heart failure, unspecified: Secondary | ICD-10-CM | POA: Insufficient documentation

## 2023-11-08 DIAGNOSIS — R059 Cough, unspecified: Secondary | ICD-10-CM | POA: Diagnosis present

## 2023-11-08 LAB — RESP PANEL BY RT-PCR (RSV, FLU A&B, COVID)  RVPGX2
Influenza A by PCR: NEGATIVE
Influenza B by PCR: NEGATIVE
Resp Syncytial Virus by PCR: NEGATIVE
SARS Coronavirus 2 by RT PCR: NEGATIVE

## 2023-11-08 LAB — BASIC METABOLIC PANEL WITH GFR
Anion gap: 14 (ref 5–15)
BUN: 20 mg/dL (ref 6–20)
CO2: 26 mmol/L (ref 22–32)
Calcium: 9 mg/dL (ref 8.9–10.3)
Chloride: 102 mmol/L (ref 98–111)
Creatinine, Ser: 1.29 mg/dL — ABNORMAL HIGH (ref 0.61–1.24)
GFR, Estimated: >60 mL/min (ref >60)
Glucose, Bld: 117 mg/dL — ABNORMAL HIGH (ref 70–99)
Potassium: 3.5 mmol/L (ref 3.5–5.1)
Sodium: 142 mmol/L (ref 135–145)

## 2023-11-08 LAB — PRO BRAIN NATRIURETIC PEPTIDE: Pro Brain Natriuretic Peptide: 147 pg/mL (ref <300.0)

## 2023-11-08 MED ORDER — BENZONATATE 100 MG PO CAPS
100.0000 mg | ORAL_CAPSULE | Freq: Three times a day (TID) | ORAL | 0 refills | Status: DC
  Filled 2023-11-08 – 2023-11-09 (×2): qty 21, 7d supply, fill #0

## 2023-11-08 MED ORDER — ACETAMINOPHEN 500 MG PO TABS
1000.0000 mg | ORAL_TABLET | Freq: Once | ORAL | Status: AC
  Administered 2023-11-08: 1000 mg via ORAL
  Filled 2023-11-08: qty 2

## 2023-11-08 MED ORDER — ONDANSETRON 4 MG PO TBDP
4.0000 mg | ORAL_TABLET | Freq: Once | ORAL | Status: AC
  Administered 2023-11-08: 4 mg via ORAL
  Filled 2023-11-08: qty 1

## 2023-11-08 MED ORDER — ACETAMINOPHEN 500 MG PO TABS
500.0000 mg | ORAL_TABLET | Freq: Four times a day (QID) | ORAL | 0 refills | Status: AC | PRN
  Filled 2023-11-08 – 2023-11-09 (×2): qty 30, 8d supply, fill #0

## 2023-11-08 MED ORDER — BENZONATATE 100 MG PO CAPS
100.0000 mg | ORAL_CAPSULE | Freq: Once | ORAL | Status: AC
  Administered 2023-11-08: 100 mg via ORAL
  Filled 2023-11-08: qty 1

## 2023-11-08 NOTE — Discharge Instructions (Addendum)
 Your symptoms are likely due to a viral illness.  Please take medication prescribed.  Follow-up with your doctor for further care.

## 2023-11-08 NOTE — ED Triage Notes (Signed)
 Pt presents to the ED via [facility] with complaints of cold like sx for several days including: chills, nausea, subjective fever, body aches, and cough. A&Ox4 at this time. Denies CP or SOB.

## 2023-11-08 NOTE — ED Provider Notes (Signed)
 [place] EMERGENCY DEPARTMENT AT [facility] Provider Note   CSN: [id number] Arrival date & time: 11/08/23  8077     Patient presents with: Generalized Body Aches   Christian [name] is a 48 y.o. male.   The history is provided by the patient and medical records. No language interpreter was used.     48 year old male with history of obesity, CHF, homelessness, anxiety, presenting with cold symptoms.  For the past week patient has had cough, increased shortness of breath, feeling fatigued, having sweats, and now having some loose stools and nausea.  Patient states he is sleep with a CPAP machine at nighttime for the past few days he feels suffocated with his CPAP machine.  He has not noticed increasing fluid retention.  He denies any hemoptysis no significant chest pain.  He is currently staying at a homeless shelter.  Today after eating some dairy products he did have some bowel incontinence along with loose stools.  Prior to Admission medications   Medication Sig Start Date End Date Taking? Authorizing Provider  albuterol  (VENTOLIN  HFA) 108 (90 Base) MCG/ACT inhaler Inhale 1-2 puffs into the lungs every 6 (six) hours as needed for wheezing or shortness of breath. 07/08/23   Reddick, Johnathan B, NP  busPIRone  (BUSPAR ) 7.5 MG tablet Take 1 tablet (7.5 mg total) by mouth 2 (two) times daily. 10/13/23   Nwoko, Uchenna E, PA  ciprofloxacin -dexamethasone  (CIPRODEX ) OTIC suspension Place 4 drops into both ears 2 (two) times daily. 10/04/23   Barrett, Rhonda G, PA-C  fluticasone  (FLONASE ) 50 MCG/ACT nasal spray Spray 2 sprays into both nostrils once daily. 09/08/23   Tanda Bleacher, MD  fluticasone -salmeterol (ADVAIR  HFA) 230-21 MCG/ACT inhaler Inhale 2 puffs into the lungs 2 (two) times daily. 09/07/23   Olalere, Jennet LABOR, MD  meloxicam  (MOBIC ) 15 MG tablet Take 1 tablet (15 mg total) by mouth daily. 07/19/23   Gretta Bertrum ORN, PA-C  Multiple Vitamin (MULTIVITAMIN PO) Take by mouth.     [provider]  sertraline  (ZOLOFT ) 100 MG tablet Take 2 tablets (200 mg total) by mouth at bedtime. 10/13/23   Nwoko, Uchenna E, PA  torsemide  (DEMADEX ) 20 MG tablet Take 2 tablets (40 mg total) by mouth 2 (two) times daily. 07/28/23 02/07/24  [name] Jackee VEAR Mickey., NP  traZODone  (DESYREL ) 50 MG tablet Take 1 tablet (50 mg total) by mouth at bedtime. 08/04/23   Nwoko, Uchenna E, PA    Allergies: Shellfish allergy    Review of Systems  All other systems reviewed and are negative.   Updated Vital Signs BP (!) 154/89   Pulse 96   Temp 98.1 F (36.7 C) (Oral)   Resp 18   Ht 6' 2 (1.88 m)   Wt (!) 181.4 kg   SpO2 95%   BMI 51.36 kg/m   Physical Exam Constitutional:      General: He is not in acute distress.    Appearance: He is well-developed. He is obese. He is diaphoretic.  HENT:     Head: Atraumatic.  Eyes:     Conjunctiva/sclera: Conjunctivae normal.  Cardiovascular:     Rate and Rhythm: Normal rate and regular rhythm.  Pulmonary:     Effort: Pulmonary effort is normal.     Breath sounds: Rales present. No wheezing or rhonchi.  Abdominal:     Palpations: Abdomen is soft.     Tenderness: There is no abdominal tenderness.  Musculoskeletal:     Cervical back: Normal range of motion  and neck supple.     Right lower leg: Edema present.     Left lower leg: Edema present.  Skin:    Findings: No rash.  Neurological:     Mental Status: He is alert and oriented to person, place, and time.     (all labs ordered are listed, but only abnormal results are displayed) Labs Reviewed  BASIC METABOLIC PANEL WITH GFR - Abnormal; Notable for the following components:      Result Value   Glucose, Bld 117 (*)    Creatinine, Ser 1.29 (*)    All other components within normal limits  RESP PANEL BY RT-PCR (RSV, FLU A&B, COVID)  [id number]  PRO BRAIN NATRIURETIC PEPTIDE    EKG: None ED ECG REPORT   Date: 11/08/2023  Rate: 81  Rhythm: normal sinus rhythm  QRS Axis: left   Intervals: normal  ST/T Wave abnormalities: nonspecific ST changes  Conduction Disutrbances:nonspecific intraventricular conduction delay  Narrative Interpretation:   Old EKG Reviewed: unchanged  I have personally reviewed the EKG tracing and agree with the computerized printout as noted.   Radiology: DG Chest 2 View Result Date: 11/08/2023 CLINICAL DATA:  Shortness of breath EXAM: CHEST - 2 VIEW COMPARISON:  07/08/2023 FINDINGS: Frontal and lateral views of the chest demonstrate a stable cardiac silhouette. No acute airspace disease, effusion, or pneumothorax. Stable right perihilar scarring. No acute bony abnormalities. IMPRESSION: 1. Stable chest, no acute process. Electronically Signed   By: Ozell Daring M.D.   On: 11/08/2023 20:44     Procedures   Medications Ordered in the ED  ondansetron  (ZOFRAN -ODT) disintegrating tablet 4 mg (4 mg Oral Given 11/08/23 2125)  acetaminophen  (TYLENOL ) tablet 1,000 mg (1,000 mg Oral Given 11/08/23 2125)  benzonatate  (TESSALON ) capsule 100 mg (100 mg Oral Given 11/08/23 2125)                                    Medical Decision Making Amount and/or Complexity of Data Reviewed Labs: ordered. Radiology: ordered.  Risk OTC drugs. Prescription drug management.   BP (!) 154/89   Pulse 96   Temp 98.1 F (36.7 C) (Oral)   Resp 18   Ht 6' 2 (1.88 m)   Wt (!) 181.4 kg   SpO2 95%   BMI 51.36 kg/m   25:1 PM  48 year old male with history of obesity, CHF, homelessness, anxiety, presenting with cold symptoms.  For the past week patient has had cough, increased shortness of breath, feeling fatigued, having sweats, and now having some loose stools and nausea.  Patient states he is sleep with a CPAP machine at nighttime for the past few days he feels suffocated with his CPAP machine.  He has not noticed increasing fluid retention.  He denies any hemoptysis no significant chest pain.  He is currently staying at a homeless shelter.  Today after eating  some dairy products he did have some bowel incontinence along with loose stools.  On exam this is a obese male, he appears diaphoretic and has cough on occasion in the room.  He is have some mild crackles in his lower lung bases.  Difficult to fully appreciate if he has significant peripheral edema due to his body habitus.  Workup initiated.  -Labs ordered, independently viewed and interpreted by me.  Labs remarkable for negative COVID, flu, RSV.  Normal BNP doubt CHF exacerbation, mild AKI with creatinine of 1.29 -The patient  was maintained on a cardiac monitor.  I personally viewed and interpreted the cardiac monitored which showed an underlying rhythm of: Normal sinus rhythm -Imaging independently viewed and interpreted by me and I agree with radiologist's interpretation.  Result remarkable for chest x-ray without signs of pneumonia -This patient presents to the ED for concern of flulike symptoms, this involves an extensive number of treatment options, and is a complaint that carries with it a high risk of complications and morbidity.  The differential diagnosis includes COVID, flu, RSV, pneumonia, viral illness, CHF exacerbation, ACS -Co morbidities that complicate the patient evaluation includes CHF -Treatment includes Zofran , Tylenol , Tessalon  -Reevaluation of the patient after these medicines showed that the patient improved -PCP office notes or outside notes reviewed -Escalation to admission/observation considered: patients feels much better, is comfortable with discharge, and will follow up with PCP -Prescription medication considered, patient comfortable with tylenol , tessalon  -Social Determinant of Health considered which includes unhoused, food insecurity, lack of transportation      Final diagnoses:  Viral illness    ED Discharge Orders          Ordered    acetaminophen  (TYLENOL ) 500 MG tablet  Every 6 hours PRN        11/08/23 2234    benzonatate  (TESSALON ) 100 MG capsule   Every 8 hours        11/08/23 2234               Nivia Colon, PA-C 11/08/23 2235    Pamella Ozell LABOR, DO 11/16/23 [phone]

## 2023-11-09 ENCOUNTER — Other Ambulatory Visit: Payer: Self-pay

## 2023-11-09 ENCOUNTER — Encounter: Payer: Self-pay | Admitting: *Deleted

## 2023-11-09 ENCOUNTER — Other Ambulatory Visit (HOSPITAL_COMMUNITY): Payer: Self-pay

## 2023-11-09 NOTE — Congregational Nurse Program (Signed)
  Dept: [phone]   Congregational Nurse Program Note  Date of Encounter: 11/09/2023  Past Medical History: Past Medical History:  Diagnosis Date   Allergic rhinitis    Anxiety    CHF (congestive heart failure) (HCC)    Depression    Depression    Elevated blood pressure reading without diagnosis of hypertension    History of chicken pox    Obesity    Perforated bowel (HCC)    Pleural effusion    Sepsis (HCC)    Sleep apnea    O2 at night    Encounter Details:  Community Questionnaire - 11/09/23 1417       Questionnaire   Ask client: Do you give verbal consent for me to treat you today? Yes    Student Assistance N/A    Location Patient Served  [facility]    Encounter Setting CN site;Phone/Text/Email    Population Status Unhoused    Insurance [employer]    Insurance/Financial Assistance Referral N/A    Medication Have Medication Insecurities;Provided Medication Assistance    Medical Provider Yes    Screening Referrals Made N/A    Medical Referrals Made N/A    Medical Appointment Completed N/A    CNP Interventions Advocate/Support;Case Management    Screenings CN Performed N/A    ED Visit Averted N/A    Life-Saving Intervention Made N/A         Medications were delivered by pharmacy and placed in writer's office. Writer contacted client to inform about medications and he said he had more medication at [facility] pharmacy from recent ED visit. Picked up medication for client and brought to [facility]. Left at [facility] front desk for client with his name and bed number. Kymani Shimabukuro W RN CN

## 2023-11-13 ENCOUNTER — Other Ambulatory Visit: Payer: Self-pay | Admitting: Physician Assistant

## 2023-11-13 ENCOUNTER — Other Ambulatory Visit (HOSPITAL_COMMUNITY): Payer: Self-pay

## 2023-11-13 ENCOUNTER — Other Ambulatory Visit: Payer: Self-pay

## 2023-11-13 MED FILL — Meloxicam Tab 15 MG: ORAL | 30 days supply | Qty: 30 | Fill #0 | Status: AC

## 2023-11-14 ENCOUNTER — Other Ambulatory Visit (HOSPITAL_COMMUNITY): Payer: Self-pay

## 2023-11-15 ENCOUNTER — Other Ambulatory Visit (HOSPITAL_COMMUNITY): Payer: Self-pay

## 2023-11-22 ENCOUNTER — Other Ambulatory Visit: Payer: Self-pay

## 2023-11-22 ENCOUNTER — Other Ambulatory Visit (HOSPITAL_COMMUNITY): Payer: Self-pay

## 2023-11-22 ENCOUNTER — Other Ambulatory Visit (HOSPITAL_COMMUNITY): Payer: Self-pay | Admitting: Physician Assistant

## 2023-11-22 DIAGNOSIS — F331 Major depressive disorder, recurrent, moderate: Secondary | ICD-10-CM

## 2023-11-23 ENCOUNTER — Other Ambulatory Visit (HOSPITAL_COMMUNITY): Payer: Self-pay

## 2023-11-24 ENCOUNTER — Other Ambulatory Visit (HOSPITAL_COMMUNITY): Payer: Self-pay

## 2023-11-27 ENCOUNTER — Encounter (HOSPITAL_COMMUNITY): Payer: Self-pay

## 2023-11-27 ENCOUNTER — Ambulatory Visit (HOSPITAL_COMMUNITY): Payer: MEDICAID | Admitting: Licensed Clinical Social Worker

## 2023-11-30 ENCOUNTER — Other Ambulatory Visit (HOSPITAL_COMMUNITY): Payer: Self-pay | Admitting: Physician Assistant

## 2023-11-30 ENCOUNTER — Other Ambulatory Visit (HOSPITAL_COMMUNITY): Payer: Self-pay

## 2023-11-30 DIAGNOSIS — F331 Major depressive disorder, recurrent, moderate: Secondary | ICD-10-CM

## 2023-12-05 ENCOUNTER — Other Ambulatory Visit: Payer: Self-pay

## 2023-12-05 ENCOUNTER — Other Ambulatory Visit: Payer: Self-pay | Admitting: Family Medicine

## 2023-12-05 ENCOUNTER — Other Ambulatory Visit (HOSPITAL_COMMUNITY): Payer: Self-pay

## 2023-12-05 MED ORDER — FLUTICASONE PROPIONATE 50 MCG/ACT NA SUSP
2.0000 | Freq: Every day | NASAL | 2 refills | Status: DC
  Filled 2023-12-05: qty 16, 30d supply, fill #0
  Filled 2024-01-05: qty 16, 30d supply, fill #1
  Filled 2024-02-03 – 2024-02-08 (×2): qty 16, 30d supply, fill #2

## 2023-12-05 MED FILL — Meloxicam Tab 15 MG: ORAL | 30 days supply | Qty: 30 | Fill #1 | Status: AC

## 2023-12-06 ENCOUNTER — Telehealth (HOSPITAL_COMMUNITY): Payer: Self-pay | Admitting: Physician Assistant

## 2023-12-06 NOTE — Telephone Encounter (Signed)
 Patient had to reschedule his appt from Friday, 10/10 to Wednesday, 11/19. Patient could use a bridge/refill of meds until his appt. Please advise. Thank you.

## 2023-12-08 ENCOUNTER — Encounter (HOSPITAL_COMMUNITY): Payer: MEDICAID | Admitting: Physician Assistant

## 2023-12-08 ENCOUNTER — Ambulatory Visit: Payer: MEDICAID | Admitting: Pulmonary Disease

## 2023-12-08 ENCOUNTER — Encounter: Payer: Self-pay | Admitting: Pulmonary Disease

## 2023-12-08 VITALS — BP 138/79 | HR 61 | Temp 97.6°F | Ht 74.0 in | Wt >= 6400 oz

## 2023-12-08 DIAGNOSIS — R0602 Shortness of breath: Secondary | ICD-10-CM | POA: Diagnosis not present

## 2023-12-08 DIAGNOSIS — G4733 Obstructive sleep apnea (adult) (pediatric): Secondary | ICD-10-CM | POA: Diagnosis not present

## 2023-12-08 NOTE — Patient Instructions (Signed)
 Continue using your CPAP nightly  Download from your machine shows it is working well  Follow-up in about 6 months  Call us  with significant concerns  Continue weight loss efforts  Regular exercises encouraged  Continue Advair 

## 2023-12-08 NOTE — Progress Notes (Signed)
 Christian [name]    [id number]    Dec 17, 1975  Primary Care Physician:Wilson, Raguel, MD  Referring Physician: Tanda Raguel, MD [address] [place],  [place] [address]  Chief complaint:   Patient being seen for sleep apnea, shortness of breath on exertion  HPI:  Patient with sleep apnea Compliant with CPAP use No significant issues with CPAP use  Asthma - On Advair  Compliant with use  Class III obesity - Ambulates with his walker - Limited by osteoarthritis  Recently had his disability findings approved  History of anxiety Past pneumonia with most recent CT stable from previous  History of respiratory failure History of diastolic heart failure  Complicated course in 2022 with respiratory failure, influenza, pneumonia, sepsis, decubitus ulcer most recent hospitalization was in September 2024  Never smoked, exposure to second hand smoke because of current living situation  Overall he is doing well   Outpatient Encounter Medications as of 12/08/2023  Medication Sig   acetaminophen  (TYLENOL ) 500 MG tablet Take 1 tablet (500 mg total) by mouth every 6 (six) hours as needed.   albuterol  (VENTOLIN  HFA) 108 (90 Base) MCG/ACT inhaler Inhale 1-2 puffs into the lungs every 6 (six) hours as needed for wheezing or shortness of breath.   busPIRone  (BUSPAR ) 7.5 MG tablet Take 1 tablet (7.5 mg total) by mouth 2 (two) times daily.   fluticasone  (FLONASE ) 50 MCG/ACT nasal spray Spray 2 sprays into both nostrils once daily.   fluticasone -salmeterol (ADVAIR  HFA) 230-21 MCG/ACT inhaler Inhale 2 puffs into the lungs 2 (two) times daily.   meloxicam  (MOBIC ) 15 MG tablet Take 1 tablet (15 mg total) by mouth daily.   Multiple Vitamin (MULTIVITAMIN PO) Take by mouth.   sertraline  (ZOLOFT ) 100 MG tablet Take 2 tablets (200 mg total) by mouth at bedtime.   torsemide  (DEMADEX ) 20 MG tablet Take 2 tablets (40 mg total) by mouth 2 (two) times daily.   traZODone   (DESYREL ) 50 MG tablet Take 1 tablet (50 mg total) by mouth at bedtime.   [DISCONTINUED] benzonatate  (TESSALON ) 100 MG capsule Take 1 capsule (100 mg total) by mouth every 8 (eight) hours.   [DISCONTINUED] ciprofloxacin -dexamethasone  (CIPRODEX ) OTIC suspension Place 4 drops into both ears 2 (two) times daily.   Facility-Administered Encounter Medications as of 12/08/2023  Medication   cholecalciferol (VITAMIN D3) tablet 1,000 Units    Allergies as of 12/08/2023 - Review Complete 12/08/2023  Allergen Reaction Noted   Shellfish allergy Nausea Only 12/08/2020    Past Medical History:  Diagnosis Date   Allergic rhinitis    Anxiety    CHF (congestive heart failure) (HCC)    Depression    Depression    Elevated blood pressure reading without diagnosis of hypertension    History of chicken pox    Obesity    Perforated bowel (HCC)    Pleural effusion    Sepsis (HCC)    Sleep apnea    O2 at night    Past Surgical History:  Procedure Laterality Date   COLON SURGERY     perforated bowel   TONSILLECTOMY AND ADENOIDECTOMY  03/01/1983    Family History  Problem Relation Age of Onset   Stroke Mother    Diabetes Mother        pre-diabetes   Heart disease Mother        CHF, pacer   Cancer Mother        lymphoma   Stomach cancer Mother  Clotting disorder Mother    Coronary artery disease Father 4       MI   Hypertension Father    Hyperlipidemia Father    Diabetes Father    Heart attack Father    Migraines Sister    Stroke Paternal Grandmother    Diabetes Paternal Grandmother    Heart disease Paternal Grandfather    Parkinson's disease Paternal Grandfather    Heart attack Paternal Grandfather     Social History   Socioeconomic History   Marital status: Single    Spouse name: Not on file   Number of children: 0   Years of education: Not on file   Highest education level: Some college, no degree  Occupational History   Not on file  Tobacco Use   Smoking status:  Never    Passive exposure: Current   Smokeless tobacco: Never   Tobacco comments:    Living in homeless shelter  Vaping Use   Vaping status: Never Used  Substance and Sexual Activity   Alcohol use: Not Currently    Comment: occasionally   Drug use: Never   Sexual activity: Not on file  Other Topics Concern   Not on file  Social History Narrative   Caffeine: occasional, lives at a shelter ([facility])  Occupation: works at [employer] part timeEdu: some college Diet: some water, some fruits/vegetables, 1-2x/wk red meat, fish 1x/wk   Social Drivers of Corporate investment banker Strain: Medium Risk (09/08/2023)   Overall Financial Resource Strain (CARDIA)    Difficulty of Paying Living Expenses: Somewhat hard  Food Insecurity: Food Insecurity Present (09/08/2023)   Hunger Vital Sign    Worried About Running Out of Food in the Last Year: Often true    Ran Out of Food in the Last Year: Often true  Transportation Needs: Unmet Transportation Needs (09/08/2023)   PRAPARE - Transportation    Lack of Transportation (Medical): Yes    Lack of Transportation (Non-Medical): Yes  Physical Activity: Inactive (09/08/2023)   Exercise Vital Sign    Days of Exercise per Week: 0 days    Minutes of Exercise per Session: Not on file  Stress: Stress Concern Present (09/08/2023)   [employer] of Occupational Health - Occupational Stress Questionnaire    Feeling of Stress: To some extent  Social Connections: Moderately Integrated (09/08/2023)   Social Connection and Isolation Panel    Frequency of Communication with Friends and Family: More than three times a week    Frequency of Social Gatherings with Friends and Family: Three times a week    Attends Religious Services: More than 4 times per year    Active Member of Clubs or Organizations: Yes    Attends Banker Meetings: More than 4 times per year    Marital Status: Never married  Intimate Partner Violence: Not At Risk  (03/13/2023)   Humiliation, Afraid, Rape, and Kick questionnaire    Fear of Current or Ex-Partner: No    Emotionally Abused: No    Physically Abused: No    Sexually Abused: No    Review of Systems  Constitutional:  Positive for fatigue.  Respiratory:  Positive for apnea.   Psychiatric/Behavioral:  Positive for sleep disturbance.     Vitals:   12/08/23 0954  BP: 138/79  Pulse: 61  Temp: 97.6 F (36.4 C)  SpO2: 96%     Physical Exam Constitutional:      Appearance: He is obese.  HENT:  Head: Normocephalic.     Mouth/Throat:     Mouth: Mucous membranes are moist.     Comments: Mallampati 4, crowded oropharynx Eyes:     General: No scleral icterus. Cardiovascular:     Rate and Rhythm: Normal rate and regular rhythm.     Heart sounds: No murmur heard.    No friction rub.  Pulmonary:     Effort: No respiratory distress.     Breath sounds: No stridor. No wheezing or rhonchi.  Musculoskeletal:     Cervical back: No rigidity or tenderness.  Neurological:     Mental Status: He is alert.  Psychiatric:        Mood and Affect: Mood normal.    Data Reviewed: Last office note was reviewed  Most recent CT with stable nodules  Echocardiogram 06/22/2022 with normal ejection fraction, diastolic dysfunction  PFT October 30, 2022 with obstructive physiology  CPAP compliance reviewed showing excellent compliance Remains on a CPAP of 12 residual AHI 1.2 Average use of 7 hours 47 minutes No significant mask leaks  Assessment:  Obstructive sleep apnea - Remains compliant with CPAP - Continues to benefit from CPAP use - Download shows CPAP is working well  Class III obesity - He continues to work on weight loss  History of asthma - Remains on Advair  - Albuterol  to be used as needed  Diastolic dysfunction - Risk modification  Musculoskeletal pain and dysfunction Chronic knee pain -This is obviously contributing to his limitations  History of depression and  anxiety -No changes in his medications  Plan/Recommendations: Encouraged to continue using CPAP on a nightly basis  Continue Advair   Rescue inhaler use as needed  Weight loss efforts encouraged  Encouraged to give us  a call with significant concerns  Follow-up in 6 months  Jennet Epley MD  Pulmonary and Critical Care 12/08/2023, 10:15 AM  CC: Tanda Bleacher, MD

## 2023-12-09 ENCOUNTER — Other Ambulatory Visit (HOSPITAL_COMMUNITY): Payer: Self-pay

## 2023-12-09 ENCOUNTER — Other Ambulatory Visit (HOSPITAL_COMMUNITY): Payer: Self-pay | Admitting: Physician Assistant

## 2023-12-09 DIAGNOSIS — F331 Major depressive disorder, recurrent, moderate: Secondary | ICD-10-CM

## 2023-12-09 DIAGNOSIS — F411 Generalized anxiety disorder: Secondary | ICD-10-CM

## 2023-12-09 MED ORDER — BUSPIRONE HCL 7.5 MG PO TABS
7.5000 mg | ORAL_TABLET | Freq: Two times a day (BID) | ORAL | 2 refills | Status: DC
  Filled 2023-12-09 – 2023-12-23 (×2): qty 60, 30d supply, fill #0
  Filled 2024-02-03 – 2024-02-08 (×2): qty 60, 30d supply, fill #1
  Filled 2024-03-20: qty 60, 30d supply, fill #2

## 2023-12-09 MED ORDER — TRAZODONE HCL 50 MG PO TABS
50.0000 mg | ORAL_TABLET | Freq: Every day | ORAL | 2 refills | Status: AC
  Filled 2023-12-09: qty 30, 30d supply, fill #0
  Filled 2024-01-05: qty 30, 30d supply, fill #1
  Filled 2024-02-08: qty 30, 30d supply, fill #2

## 2023-12-09 MED ORDER — SERTRALINE HCL 100 MG PO TABS
200.0000 mg | ORAL_TABLET | Freq: Every day | ORAL | 2 refills | Status: AC
  Filled 2023-12-09 – 2023-12-23 (×2): qty 60, 30d supply, fill #0
  Filled 2024-02-03 – 2024-02-08 (×2): qty 60, 30d supply, fill #1
  Filled 2024-03-20: qty 60, 30d supply, fill #2

## 2023-12-09 NOTE — Telephone Encounter (Signed)
Message acknowledged and reviewed. Patient's medications to be e-prescribed to pharmacy of choice.

## 2023-12-09 NOTE — Progress Notes (Signed)
 Provider to bridge patient's medications. Patient's next appointment with this provider is scheduled on 01/17/2024 @ 2:30 PM.

## 2023-12-11 ENCOUNTER — Other Ambulatory Visit: Payer: Self-pay

## 2023-12-11 ENCOUNTER — Other Ambulatory Visit (HOSPITAL_COMMUNITY): Payer: Self-pay

## 2023-12-12 ENCOUNTER — Ambulatory Visit (INDEPENDENT_AMBULATORY_CARE_PROVIDER_SITE_OTHER): Payer: MEDICAID | Admitting: Licensed Clinical Social Worker

## 2023-12-12 DIAGNOSIS — F331 Major depressive disorder, recurrent, moderate: Secondary | ICD-10-CM

## 2023-12-12 DIAGNOSIS — F411 Generalized anxiety disorder: Secondary | ICD-10-CM

## 2023-12-12 NOTE — Progress Notes (Signed)
 THERAPIST PROGRESS NOTE  Session Time: 62  Participation Level: Active  Behavioral Response: CasualAlertAnxious and Depressed  Type of Therapy: Individual Therapy  Treatment Goals addressed:  Active     Anxiety     LTG: Christian [name] will score less than 5 on the Generalized Anxiety Disorder 7 Scale (GAD-7)  (Completed/Met)     Start:  05/11/22    Expected End:  10/28/22    Resolved:  08/17/22    Goal Note     Scpre taken today and last session scored below a 5 each time          STG: Christian [name] will complete at least 80% of assigned homework  (Progressing)     Start:  05/11/22    Expected End:  05/03/24         STG: Christian [name] will practice problem solving skills 3 times per week for the next 4 weeks.  (Progressing)     Start:  05/11/22    Expected End:  05/03/24         identify 3 trigger for anxiety  (Progressing)     Start:  05/11/22    Expected End:  05/03/24         create  3 coping skills for depression  (Progressing)     Start:  05/11/22    Expected End:  05/03/24         Discuss risks and benefits of medication treatment options for this problem and prescribe as indicated (Completed)     Start:  05/11/22    End:  07/11/23      Encourage Christian [name] to take psychotropic medication(s) as prescribed (Completed)     Start:  05/11/22    End:  07/11/23      Review results of GAD-7 with Christian [name] to track progress (Completed)     Start:  05/11/22    End:  06/28/22    Intervention Note     Reviewed with patient during session.          Work with Christian [name] to track symptoms, triggers, and/or skill use through a mood chart, diary card, or journal (Completed)     Start:  05/11/22    End:  06/07/22      Perform psychoeducation regarding anxiety disorders (Completed)     Start:  05/11/22    End:  06/07/22      Provide Christian [name] with educational information and reading material on anxiety, its causes, and symptoms.  (Completed)     Start:  05/11/22    End:  06/07/22       Work with patient individually to identify the major components of a recent episode of anxiety: physical symptoms, major thoughts and images, and major behaviors they experienced (Completed)     Start:  05/11/22    End:  06/07/22      Work with Christian [name] to identify 3 personal goals for managing their anxiety to work on during current treatment.  (Completed)     Start:  05/11/22    End:  06/28/22      Work with Christian [name] to identify a minimum of 3 consequences of avoidance.  (Completed)     Start:  05/11/22    End:  06/28/22      Work with Christian [name] to identify a minimum of 3 alternative coping behaviors to avoidance.  (Completed)     Start:  05/11/22    End:  07/11/23      Create a weekly activity schedule (Completed)  Start:  05/11/22    End:  07/11/23        OP Depression     LTG: Reduce frequency, intensity, and duration of depression symptoms so that daily functioning is improved (Progressing)     Start:  05/11/22    Expected End:  05/03/24         LTG: Increase coping skills to manage depression and improve ability to perform daily activities (Progressing)     Start:  05/11/22    Expected End:  05/03/24         LTG: Christian [name] will score less than 10 on the Patient Health Questionnaire (PHQ-9)  (Completed/Met)     Start:  05/11/22    Expected End:  10/28/22    Resolved:  08/17/22    Goal Note     Scpre taken today and last session scored below a 5 each time          Work with Christian [name] to track symptoms, triggers, and/or skill use through a mood chart, diary card, or journal (Completed)     Start:  05/11/22    End:  06/07/22      Therapist will administer the PHQ-9 at weekly intervals for the next 8 weeks (Completed)     Start:  05/11/22    End:  06/28/22      Work with Christian [name] to identify the major components of a recent episode of depression: physical symptoms, major thoughts and images, and major behaviors they experienced (Completed)     Start:   05/11/22    End:  06/07/22         ProgressTowards Goals: Progressing  Interventions: CBT, Motivational Interviewing, and Supportive   Suicidal/Homicidal: Nowithout intent/plan  Therapist Response:     Christian [name] was alert and oriented x 5.  He was pleasant, cooperative, maintained good eye contact.  He engaged well in therapy session was dressed casually.  Patient presented with euthymic mood\affect.  Patient reports good news as he has been approved for Social Security disability.  He reports that he has gotten a preliminary packet of approval stating that he will not get back pay but Social Security disability was approved.  Christian [name] states the next steps will be to get the official Social Security disability approval letter with the amount and the date of it to start.    Further newt steps will be to get a determination from her Social Security income.  Patient reports that he is excited as this will be a big step in getting out of his current housing situation at a local shelter.  Patient reports that he would like to update his Christian [name] case Production designer, theatre[employer]television/film as they have indicated that they have housing options for him once and calm has been obtained.  Intervention/plan: LCSW utilized psycho analytic therapy for patient to express thoughts, feelings and emotions and nonjudgmental environment.  LCSW utilized motivational interviewing for reflective listening, open-ended questions, and positive affirmations.  LCSW utilized cognitive behavioral therapy for cognitive restructuring and reframing.  LCSW and patient discussed this LCSW transferring to [facility] psychiatric office through [facility] health.  LCSW advised patient that December 15 will be LCSW's last day.  LCSW advised patient that there will be transfer options available to Christian [name] with a new therapist.  Plan: Return again in 4 weeks.  Diagnosis: MDD (major depressive disorder), recurrent episode, moderate  (HCC)  GAD (generalized anxiety disorder)  Collaboration of Care: Other No  Patient/Guardian was advised Release of Information must  be obtained prior to any record release in order to collaborate their care with an outside provider. Patient/Guardian was advised if they have not already done so to contact the registration department to sign all necessary forms in order for us  to release information regarding their care.   Consent: Patient/Guardian gives verbal consent for treatment and assignment of benefits for services provided during this visit. Patient/Guardian expressed understanding and agreed to proceed.   Juliene GORMAN Patee, LCSW 12/12/2023

## 2023-12-13 ENCOUNTER — Ambulatory Visit: Payer: MEDICAID | Admitting: Family Medicine

## 2023-12-13 ENCOUNTER — Telehealth: Payer: Self-pay

## 2023-12-13 ENCOUNTER — Encounter: Payer: Self-pay | Admitting: Family Medicine

## 2023-12-13 VITALS — BP 121/76 | HR 72 | Ht 74.0 in | Wt >= 6400 oz

## 2023-12-13 DIAGNOSIS — Z6841 Body Mass Index (BMI) 40.0 and over, adult: Secondary | ICD-10-CM

## 2023-12-13 DIAGNOSIS — E66813 Obesity, class 3: Secondary | ICD-10-CM

## 2023-12-13 DIAGNOSIS — I10 Essential (primary) hypertension: Secondary | ICD-10-CM | POA: Diagnosis not present

## 2023-12-13 DIAGNOSIS — F32A Depression, unspecified: Secondary | ICD-10-CM | POA: Diagnosis not present

## 2023-12-13 DIAGNOSIS — Z5901 Sheltered homelessness: Secondary | ICD-10-CM

## 2023-12-13 DIAGNOSIS — Z5900 Homelessness unspecified: Secondary | ICD-10-CM

## 2023-12-13 DIAGNOSIS — Z23 Encounter for immunization: Secondary | ICD-10-CM

## 2023-12-13 DIAGNOSIS — Z7689 Persons encountering health services in other specified circumstances: Secondary | ICD-10-CM

## 2023-12-13 MED ORDER — PHENTERMINE HCL 37.5 MG PO TABS: 37.5000 mg | ORAL_TABLET | Freq: Every day | ORAL | 0 refills | Status: DC

## 2023-12-13 NOTE — Progress Notes (Unsigned)
 Established Patient Office Visit  Subjective    Patient ID: Christian [name], male    DOB: [birth year]  Age: 48 y.o. MRN: [id number]  CC:  Chief Complaint  Patient presents with   Medical Management of Chronic Issues    Pt concerned about weight gain . Interested in weight loss medication. Reports increase in appetite  Would like flu shot     HPI Christian [name] presents for follow up of chronic med issues including hypertension and anxiety/depression. Patient reports that he has been approved for some disability. He still remains living at the shelter. He desires something to help him lose weight as he has had opportunity to eat more and is gaining weight.  Outpatient Encounter Medications as of 12/13/2023  Medication Sig   acetaminophen  (TYLENOL ) 500 MG tablet Take 1 tablet (500 mg total) by mouth every 6 (six) hours as needed.   albuterol  (VENTOLIN  HFA) 108 (90 Base) MCG/ACT inhaler Inhale 1-2 puffs into the lungs every 6 (six) hours as needed for wheezing or shortness of breath.   busPIRone  (BUSPAR ) 7.5 MG tablet Take 1 tablet (7.5 mg total) by mouth 2 (two) times daily.   fluticasone  (FLONASE ) 50 MCG/ACT nasal spray Spray 2 sprays into both nostrils once daily.   fluticasone -salmeterol (ADVAIR  HFA) 230-21 MCG/ACT inhaler Inhale 2 puffs into the lungs 2 (two) times daily.   meloxicam  (MOBIC ) 15 MG tablet Take 1 tablet (15 mg total) by mouth daily.   Multiple Vitamin (MULTIVITAMIN PO) Take by mouth.   sertraline  (ZOLOFT ) 100 MG tablet Take 2 tablets (200 mg total) by mouth at bedtime.   torsemide  (DEMADEX ) 20 MG tablet Take 2 tablets (40 mg total) by mouth 2 (two) times daily.   traZODone  (DESYREL ) 50 MG tablet Take 1 tablet (50 mg total) by mouth at bedtime.   Facility-Administered Encounter Medications as of 12/13/2023  Medication   cholecalciferol (VITAMIN D3) tablet 1,000 Units    Past Medical History:  Diagnosis Date   Allergic rhinitis    Anxiety    CHF (congestive  heart failure) (HCC)    Depression    Depression    Elevated blood pressure reading without diagnosis of hypertension    History of chicken pox    Obesity    Perforated bowel (HCC)    Pleural effusion    Sepsis (HCC)    Sleep apnea    O2 at night    Past Surgical History:  Procedure Laterality Date   COLON SURGERY     perforated bowel   TONSILLECTOMY AND ADENOIDECTOMY  03/01/1983    Family History  Problem Relation Age of Onset   Stroke Mother    Diabetes Mother        pre-diabetes   Heart disease Mother        CHF, pacer   Cancer Mother        lymphoma   Stomach cancer Mother    Clotting disorder Mother    Coronary artery disease Father 42       MI   Hypertension Father    Hyperlipidemia Father    Diabetes Father    Heart attack Father    Migraines Sister    Stroke Paternal Grandmother    Diabetes Paternal Grandmother    Heart disease Paternal Grandfather    Parkinson's disease Paternal Grandfather    Heart attack Paternal Grandfather     Social History   Socioeconomic History   Marital status: Single    Spouse name: Not  on file   Number of children: 0   Years of education: Not on file   Highest education level: Some college, no degree  Occupational History   Not on file  Tobacco Use   Smoking status: Never    Passive exposure: Current   Smokeless tobacco: Never   Tobacco comments:    Living in homeless shelter  Vaping Use   Vaping status: Never Used  Substance and Sexual Activity   Alcohol use: Not Currently    Comment: occasionally   Drug use: Never   Sexual activity: Not on file  Other Topics Concern   Not on file  Social History Narrative   Caffeine: occasional, lives at a shelter ([facility])  Occupation: works at [employer] part timeEdu: some college Diet: some water, some fruits/vegetables, 1-2x/wk red meat, fish 1x/wk   Social Drivers of Corporate investment banker Strain: High Risk (12/09/2023)   Overall Financial Resource  Strain (CARDIA)    Difficulty of Paying Living Expenses: Very hard  Food Insecurity: Food Insecurity Present (12/09/2023)   Hunger Vital Sign    Worried About Running Out of Food in the Last Year: Often true    Ran Out of Food in the Last Year: Often true  Transportation Needs: Unmet Transportation Needs (12/09/2023)   PRAPARE - Administrator, Civil Service (Medical): Yes    Lack of Transportation (Non-Medical): Yes  Physical Activity: Insufficiently Active (12/09/2023)   Exercise Vital Sign    Days of Exercise per Week: 2 days    Minutes of Exercise per Session: 20 min  Stress: Stress Concern Present (12/09/2023)   [employer] of Occupational Health - Occupational Stress Questionnaire    Feeling of Stress: Rather much  Social Connections: Moderately Integrated (12/09/2023)   Social Connection and Isolation Panel    Frequency of Communication with Friends and Family: Three times a week    Frequency of Social Gatherings with Friends and Family: Twice a week    Attends Religious Services: More than 4 times per year    Active Member of [employer] or Organizations: Yes    Attends Banker Meetings: More than 4 times per year    Marital Status: Never married  Intimate Partner Violence: Not At Risk (03/13/2023)   Humiliation, Afraid, Rape, and Kick questionnaire    Fear of Current or Ex-Partner: No    Emotionally Abused: No    Physically Abused: No    Sexually Abused: No    Review of Systems  All other systems reviewed and are negative.       Objective    BP (!) 144/84   Pulse 72   Ht 6' 2 (1.88 m)   Wt (!) 431 lb 12.8 oz (195.9 kg)   SpO2 93%   BMI 55.44 kg/m   Physical Exam Vitals and nursing note reviewed.  Constitutional:      General: He is not in acute distress.    Appearance: He is obese.  Cardiovascular:     Rate and Rhythm: Normal rate and regular rhythm.  Pulmonary:     Effort: Pulmonary effort is normal.     Breath sounds:  Normal breath sounds.  Abdominal:     Palpations: Abdomen is soft.     Tenderness: There is no abdominal tenderness.  Musculoskeletal:     Comments: Utilizing rollator  Neurological:     General: No focal deficit present.     Mental Status: He is alert and oriented to  person, place, and time.  Psychiatric:        Mood and Affect: Mood and affect normal.        Speech: Speech normal.        Behavior: Behavior normal. Behavior is cooperative.         Assessment & Plan:   1. Essential hypertension (Primary) Appears stable. Continue   2. Encounter for weight management Phentermine prescribed.   3. Class 3 severe obesity due to excess calories with serious comorbidity and body mass index (BMI) of 50.0 to 59.9 in adult (HCC)   4. Depression, unspecified depression type Appears stable. continue  5. Encounter for immunization  - Flu vaccine trivalent PF, 6mos and older(Flulaval,Afluria,Fluarix,Fluzone)  6. Homeless Patient trying to get assistance to obtain and manage disability  Return in about 3 months (around 03/14/2024) for follow up, chronic med issues.   Tanda Raguel SQUIBB, MD

## 2023-12-13 NOTE — Telephone Encounter (Signed)
 Copied from CRM [id number]. Topic: Clinical - Medication Question >> Dec 13, 2023  1:16 PM Dedra B wrote: Reason for CRM: Pt was seen in office today and wanted to see if Dr. Tanda was still going to call in an appetite suppressant. Pt would like prescription sent to [facility] on [address]. Called [facility] and they are going to get one sent in.

## 2023-12-13 NOTE — Telephone Encounter (Signed)
 Medication has been sent to the pharmacy.

## 2023-12-20 ENCOUNTER — Ambulatory Visit (HOSPITAL_COMMUNITY): Payer: MEDICAID | Admitting: Licensed Clinical Social Worker

## 2023-12-20 DIAGNOSIS — F411 Generalized anxiety disorder: Secondary | ICD-10-CM

## 2023-12-20 DIAGNOSIS — F331 Major depressive disorder, recurrent, moderate: Secondary | ICD-10-CM | POA: Diagnosis not present

## 2023-12-20 NOTE — Progress Notes (Signed)
 THERAPIST PROGRESS NOTE  Virtual Visit via Video Note  I connected with Christian [name] on 12/20/23 at  2:00 PM EDT by a video enabled telemedicine application and verified that I am speaking with the correct person using two identifiers.  Location: Patient: Christian [name]  Provider: Providers Home    I discussed the limitations of evaluation and management by telemedicine and the availability of in person appointments. The patient expressed understanding and agreed to proceed.  I discussed the assessment and treatment plan with the patient. The patient was provided an opportunity to ask questions and all were answered. The patient agreed with the plan and demonstrated an understanding of the instructions.   The patient was advised to call back or seek an in-person evaluation if the symptoms worsen or if the condition fails to improve as anticipated.  I provided 40 minutes of non-face-to-face time during this encounter.   Juliene GORMAN Patee, LCSW   Participation Level: Active  Behavioral Response: CasualAlertAnxious and Depressed  Type of Therapy: Individual Therapy  Treatment Goals addressed:  Active     Anxiety     LTG: Christian [name] will score less than 5 on the Generalized Anxiety Disorder 7 Scale (GAD-7)  (Completed/Met)     Start:  05/11/22    Expected End:  10/28/22    Resolved:  08/17/22    Goal Note     Scpre taken today and last session scored below a 5 each time          STG: Christian [name] will complete at least 80% of assigned homework  (Progressing)     Start:  05/11/22    Expected End:  05/03/24         STG: Christian will practice problem solving skills 3 times per week for the next 4 weeks.  (Progressing)     Start:  05/11/22    Expected End:  05/03/24         identify 3 trigger for anxiety  (Progressing)     Start:  05/11/22    Expected End:  05/03/24         create  3 coping skills for depression  (Progressing)     Start:  05/11/22    Expected End:   05/03/24         Discuss risks and benefits of medication treatment options for this problem and prescribe as indicated (Completed)     Start:  05/11/22    End:  07/11/23      Encourage Christian [name] to take psychotropic medication(s) as prescribed (Completed)     Start:  05/11/22    End:  07/11/23      Review results of GAD-7 with Christian to track progress (Completed)     Start:  05/11/22    End:  06/28/22    Intervention Note     Reviewed with patient during session.          Work with Christian to track symptoms, triggers, and/or skill use through a mood chart, diary card, or journal (Completed)     Start:  05/11/22    End:  06/07/22      Perform psychoeducation regarding anxiety disorders (Completed)     Start:  05/11/22    End:  06/07/22      Provide Christian with educational information and reading material on anxiety, its causes, and symptoms.  (Completed)     Start:  05/11/22    End:  06/07/22      Work with patient  individually to identify the major components of a recent episode of anxiety: physical symptoms, major thoughts and images, and major behaviors they experienced (Completed)     Start:  05/11/22    End:  06/07/22      Work with Christian to identify 3 personal goals for managing their anxiety to work on during current treatment.  (Completed)     Start:  05/11/22    End:  06/28/22      Work with Christian to identify a minimum of 3 consequences of avoidance.  (Completed)     Start:  05/11/22    End:  06/28/22      Work with Christian to identify a minimum of 3 alternative coping behaviors to avoidance.  (Completed)     Start:  05/11/22    End:  07/11/23      Create a weekly activity schedule (Completed)     Start:  05/11/22    End:  07/11/23        OP Depression     LTG: Reduce frequency, intensity, and duration of depression symptoms so that daily functioning is improved (Progressing)     Start:  05/11/22    Expected End:  05/03/24         LTG:  Increase coping skills to manage depression and improve ability to perform daily activities (Completed/Met)     Start:  05/11/22    Expected End:  05/03/24    Resolved:  12/20/23      LTG: Christian will score less than 10 on the Patient Health Questionnaire (PHQ-9)  (Completed/Met)     Start:  05/11/22    Expected End:  10/28/22    Resolved:  08/17/22    Goal Note     Scpre taken today and last session scored below a 5 each time          Work with Christian to track symptoms, triggers, and/or skill use through a mood chart, diary card, or journal (Completed)     Start:  05/11/22    End:  06/07/22      Therapist will administer the PHQ-9 at weekly intervals for the next 8 weeks (Completed)     Start:  05/11/22    End:  06/28/22      Work with Christian to identify the major components of a recent episode of depression: physical symptoms, major thoughts and images, and major behaviors they experienced (Completed)     Start:  05/11/22    End:  06/07/22         ProgressTowards Goals: Progressing  Interventions: CBT and Motivational Interviewing  Summary: Christian [name] is a 48 y.o. male who presents with euthymic mood\affect.  Patient was pleasant, cooperative, maintained good eye contact.  He engaged well in therapy session and was dressed casually.  Patient was alert and oriented x 5.   Christian [name] reports that everything has been going well since last week when we last saw each other for therapeutic session.  Patient reports no news at this time and has been feeling stable.  Christian [name] attributes this to his financial situation after getting approved for Social Security disability.  Patient also has been engaging in support through his local church for Bible study and utilizing resources at his shelter that he is currently staying at.  Future goals include housing, finalizing Social Security disability payments, and completing education at [employer].  Suicidal/Homicidal: Nowithout  intent/plan  Therapist Response:     Intervention/Plan: LCSW utilized supportive therapy  for praise and encouragement.  LCSW utilized person centered therapy for empowerment.  LCSW utilized psycho analytic therapy for patient to express thoughts, feelings and emotions and nonjudgmental environment.  LCSW validated feelings in session.  LCSW encourage patient to continue to engage in community support and coping skills.  Plan: Return again in 4 weeks.  Diagnosis: MDD (major depressive disorder), recurrent episode, moderate (HCC)  GAD (generalized anxiety disorder)  Collaboration of Care: Continued    Patient/Guardian was advised Release of Information must be obtained prior to any record release in order to collaborate their care with an outside provider. Patient/Guardian was advised if they have not already done so to contact the registration department to sign all necessary forms in order for us  to release information regarding their care.   Consent: Patient/Guardian gives verbal consent for treatment and assignment of benefits for services provided during this visit. Patient/Guardian expressed understanding and agreed to proceed.   Juliene GORMAN Patee, LCSW 12/20/2023

## 2023-12-23 ENCOUNTER — Other Ambulatory Visit (HOSPITAL_COMMUNITY): Payer: Self-pay

## 2024-01-01 ENCOUNTER — Encounter: Payer: Self-pay | Admitting: Radiology

## 2024-01-05 ENCOUNTER — Other Ambulatory Visit (HOSPITAL_BASED_OUTPATIENT_CLINIC_OR_DEPARTMENT_OTHER): Payer: Self-pay

## 2024-01-05 MED FILL — Meloxicam Tab 15 MG: ORAL | 30 days supply | Qty: 30 | Fill #2 | Status: AC

## 2024-01-09 ENCOUNTER — Encounter (HOSPITAL_COMMUNITY): Payer: Self-pay

## 2024-01-09 ENCOUNTER — Ambulatory Visit (HOSPITAL_COMMUNITY): Payer: MEDICAID | Admitting: Licensed Clinical Social Worker

## 2024-01-10 ENCOUNTER — Ambulatory Visit: Payer: MEDICAID | Admitting: Family Medicine

## 2024-01-16 ENCOUNTER — Ambulatory Visit: Payer: MEDICAID | Admitting: Family Medicine

## 2024-01-17 ENCOUNTER — Encounter (HOSPITAL_COMMUNITY): Payer: MEDICAID | Admitting: Physician Assistant

## 2024-01-17 ENCOUNTER — Encounter (HOSPITAL_COMMUNITY): Payer: Self-pay

## 2024-01-19 ENCOUNTER — Encounter: Payer: Self-pay | Admitting: Family Medicine

## 2024-01-19 ENCOUNTER — Ambulatory Visit (INDEPENDENT_AMBULATORY_CARE_PROVIDER_SITE_OTHER): Payer: MEDICAID | Admitting: Family Medicine

## 2024-01-19 VITALS — BP 151/89 | HR 82 | Ht 74.0 in | Wt >= 6400 oz

## 2024-01-19 DIAGNOSIS — I10 Essential (primary) hypertension: Secondary | ICD-10-CM

## 2024-01-19 DIAGNOSIS — E66813 Obesity, class 3: Secondary | ICD-10-CM

## 2024-01-19 DIAGNOSIS — Z6841 Body Mass Index (BMI) 40.0 and over, adult: Secondary | ICD-10-CM | POA: Diagnosis not present

## 2024-01-19 DIAGNOSIS — Z7689 Persons encountering health services in other specified circumstances: Secondary | ICD-10-CM

## 2024-01-19 MED ORDER — PHENTERMINE HCL 37.5 MG PO TABS: 37.5000 mg | ORAL_TABLET | Freq: Every day | ORAL | 0 refills | Status: DC

## 2024-01-19 NOTE — Progress Notes (Unsigned)
 Established Patient Office Visit  Subjective    Patient ID: Christian [name], male    DOB: 06-22-1975  Age: 48 y.o. MRN: [id number]  CC:  Chief Complaint  Patient presents with   Medical Management of Chronic Issues    Pt reports being a little concerned about the side effects of phentermine      HPI Christian [name] presents for routine weight management. Patient reports some little jitteriness with meds.   Outpatient Encounter Medications as of 01/19/2024  Medication Sig   acetaminophen  (TYLENOL ) 500 MG tablet Take 1 tablet (500 mg total) by mouth every 6 (six) hours as needed.   albuterol  (VENTOLIN  HFA) 108 (90 Base) MCG/ACT inhaler Inhale 1-2 puffs into the lungs every 6 (six) hours as needed for wheezing or shortness of breath.   busPIRone  (BUSPAR ) 7.5 MG tablet Take 1 tablet (7.5 mg total) by mouth 2 (two) times daily.   fluticasone  (FLONASE ) 50 MCG/ACT nasal spray Spray 2 sprays into both nostrils once daily.   fluticasone -salmeterol (ADVAIR  HFA) 230-21 MCG/ACT inhaler Inhale 2 puffs into the lungs 2 (two) times daily.   meloxicam  (MOBIC ) 15 MG tablet Take 1 tablet (15 mg total) by mouth daily.   Multiple Vitamin (MULTIVITAMIN PO) Take by mouth.   phentermine  (ADIPEX-P ) 37.5 MG tablet Take 1 tablet (37.5 mg total) by mouth daily before breakfast.   sertraline  (ZOLOFT ) 100 MG tablet Take 2 tablets (200 mg total) by mouth at bedtime.   torsemide  (DEMADEX ) 20 MG tablet Take 2 tablets (40 mg total) by mouth 2 (two) times daily.   traZODone  (DESYREL ) 50 MG tablet Take 1 tablet (50 mg total) by mouth at bedtime.   Facility-Administered Encounter Medications as of 01/19/2024  Medication   cholecalciferol (VITAMIN D3) tablet 1,000 Units    Past Medical History:  Diagnosis Date   Allergic rhinitis    Anxiety    CHF (congestive heart failure) (HCC)    Depression    Depression    Elevated blood pressure reading without diagnosis of hypertension    History of chicken pox     Obesity    Perforated bowel (HCC)    Pleural effusion    Sepsis (HCC)    Sleep apnea    O2 at night    Past Surgical History:  Procedure Laterality Date   COLON SURGERY     perforated bowel   TONSILLECTOMY AND ADENOIDECTOMY  03/01/1983    Family History  Problem Relation Age of Onset   Stroke Mother    Diabetes Mother        pre-diabetes   Heart disease Mother        CHF, pacer   Cancer Mother        lymphoma   Stomach cancer Mother    Clotting disorder Mother    Coronary artery disease Father 66       MI   Hypertension Father    Hyperlipidemia Father    Diabetes Father    Heart attack Father    Migraines Sister    Stroke Paternal Grandmother    Diabetes Paternal Grandmother    Heart disease Paternal Grandfather    Parkinson's disease Paternal Grandfather    Heart attack Paternal Grandfather     Social History   Socioeconomic History   Marital status: Single    Spouse name: Not on file   Number of children: 0   Years of education: Not on file   Highest education level: Some college, no degree  Occupational History   Not on file  Tobacco Use   Smoking status: Never    Passive exposure: Current   Smokeless tobacco: Never   Tobacco comments:    Living in homeless shelter  Vaping Use   Vaping status: Never Used  Substance and Sexual Activity   Alcohol use: Not Currently    Comment: occasionally   Drug use: Never   Sexual activity: Not on file  Other Topics Concern   Not on file  Social History Narrative   Caffeine: occasional, lives at a shelter ([address])  Occupation: works at [employer] part timeEdu: some college Diet: some water, some fruits/vegetables, 1-2x/wk red meat, fish 1x/wk   Social Drivers of Corporate Investment Banker Strain: High Risk (12/09/2023)   Overall Financial Resource Strain (CARDIA)    Difficulty of Paying Living Expenses: Very hard  Food Insecurity: Food Insecurity Present (12/09/2023)   Hunger Vital Sign     Worried About Running Out of Food in the Last Year: Often true    Ran Out of Food in the Last Year: Often true  Transportation Needs: Unmet Transportation Needs (12/09/2023)   PRAPARE - Administrator, Civil Service (Medical): Yes    Lack of Transportation (Non-Medical): Yes  Physical Activity: Insufficiently Active (12/09/2023)   Exercise Vital Sign    Days of Exercise per Week: 2 days    Minutes of Exercise per Session: 20 min  Stress: Stress Concern Present (12/09/2023)   [employer] of Occupational Health - Occupational Stress Questionnaire    Feeling of Stress: Rather much  Social Connections: Moderately Integrated (12/09/2023)   Social Connection and Isolation Panel    Frequency of Communication with Friends and Family: Three times a week    Frequency of Social Gatherings with Friends and Family: Twice a week    Attends Religious Services: More than 4 times per year    Active Member of [employer] or Organizations: Yes    Attends Banker Meetings: More than 4 times per year    Marital Status: Never married  Intimate Partner Violence: Not At Risk (03/13/2023)   Humiliation, Afraid, Rape, and Kick questionnaire    Fear of Current or Ex-Partner: No    Emotionally Abused: No    Physically Abused: No    Sexually Abused: No    Review of Systems  All other systems reviewed and are negative.       Objective    BP (!) 151/89   Pulse 82   Ht 6' 2 (1.88 m)   Wt (!) 422 lb (191.4 kg)   SpO2 94%   BMI 54.18 kg/m   Physical Exam Vitals and nursing note reviewed.  Constitutional:      General: He is not in acute distress.    Appearance: He is obese.  Cardiovascular:     Rate and Rhythm: Normal rate and regular rhythm.  Pulmonary:     Effort: Pulmonary effort is normal.     Breath sounds: Normal breath sounds.  Abdominal:     Palpations: Abdomen is soft.     Tenderness: There is no abdominal tenderness.  Neurological:     General: No focal  deficit present.     Mental Status: He is alert and oriented to person, place, and time.  Psychiatric:        Mood and Affect: Mood and affect normal.        Speech: Speech normal.  Behavior: Behavior normal. Behavior is cooperative.         Assessment & Plan:   1. Encounter for weight management (Primary) Patient doing well with present management and would like to continue at least one additional month.  2. Class 3 severe obesity due to excess calories with serious comorbidity and body mass index (BMI) of 50.0 to 59.9 in adult [facility]) As above  3. Essential hypertension Slightly elevated readings.    No follow-ups on file.   Tanda Raguel SQUIBB, MD

## 2024-01-23 ENCOUNTER — Encounter: Payer: Self-pay | Admitting: Family Medicine

## 2024-01-24 ENCOUNTER — Telehealth: Payer: Self-pay | Admitting: Family Medicine

## 2024-01-24 NOTE — Telephone Encounter (Signed)
 A document form has been faxed: prior approval for medical supplies for incontinence, to be filled out by provider. Send document back via Fax within 7-days to 14 days. Document is located in providers tray at front office.          Fax number: [phone]

## 2024-01-30 ENCOUNTER — Ambulatory Visit (HOSPITAL_COMMUNITY): Payer: MEDICAID | Admitting: Licensed Clinical Social Worker

## 2024-01-30 ENCOUNTER — Telehealth (HOSPITAL_COMMUNITY): Payer: Self-pay | Admitting: Licensed Clinical Social Worker

## 2024-01-30 ENCOUNTER — Encounter (HOSPITAL_COMMUNITY): Payer: Self-pay

## 2024-01-30 NOTE — Telephone Encounter (Signed)
 Heather Essex with [facility] called regarding the CCA for this patient. Both Brittany and I referred her to medical records and verified the number for [facility]. She replied she had reached out and they did not receive the request. She insisted I leave a message with you regarding the CCA. Her callback is [phone].

## 2024-01-31 NOTE — Telephone Encounter (Signed)
 Given to Dr. Tanda for review and signature

## 2024-02-03 ENCOUNTER — Other Ambulatory Visit (HOSPITAL_COMMUNITY): Payer: Self-pay

## 2024-02-03 ENCOUNTER — Encounter (HOSPITAL_COMMUNITY): Payer: Self-pay

## 2024-02-05 ENCOUNTER — Encounter (HOSPITAL_COMMUNITY): Payer: Self-pay | Admitting: Pharmacist

## 2024-02-05 ENCOUNTER — Encounter (HOSPITAL_COMMUNITY): Payer: Self-pay

## 2024-02-05 ENCOUNTER — Other Ambulatory Visit (HOSPITAL_COMMUNITY): Payer: Self-pay

## 2024-02-05 ENCOUNTER — Other Ambulatory Visit: Payer: Self-pay

## 2024-02-08 ENCOUNTER — Other Ambulatory Visit (HOSPITAL_COMMUNITY): Payer: Self-pay

## 2024-02-08 ENCOUNTER — Other Ambulatory Visit: Payer: Self-pay

## 2024-02-08 ENCOUNTER — Other Ambulatory Visit: Payer: Self-pay | Admitting: Physician Assistant

## 2024-02-08 MED FILL — Meloxicam Tab 15 MG: ORAL | 30 days supply | Qty: 30 | Fill #0 | Status: AC

## 2024-02-16 NOTE — Telephone Encounter (Signed)
 A document form has been faxed: additional information/completion of form , to be filled out by provider. Send document back via Fax within 7-days. Document is located in providers tray at front office.          Fax number: [phone]

## 2024-02-19 ENCOUNTER — Ambulatory Visit: Payer: MEDICAID | Admitting: Family Medicine

## 2024-03-01 ENCOUNTER — Telehealth: Payer: Self-pay | Admitting: Family Medicine

## 2024-03-01 NOTE — Telephone Encounter (Signed)
 A document form has been faxed: prior approval for medical supply: adult size pull-on/bariatric, to be filled out by provider. Send document back via Fax within 7-days to 14days. Document is located in providers tray at front office.          Fax number: [phone]

## 2024-03-04 NOTE — Telephone Encounter (Signed)
 Received. Put in Dr. Luigi folder for review when she returns to the office 1/6

## 2024-03-04 NOTE — Telephone Encounter (Unsigned)
 Copied from CRM #[id number]. Topic: Clinical - Order For Equipment >> Mar 01, 2024  9:33 AM Harlene ORN wrote: Reason for CRM: [facility] [employer] [facility] Delivered  Need revision on an Equipment Order for incontinence Supplies (adult- sized pull-ons) Document ID: [id number] located in the top right corner of the Physician's Order On the that document needs to be signed and dated and the other part of the form, called the medicaid request for Prior Approval (CMN). On that document only need lines one and two, the date of onset and mark which one is primary. And lastly, answer question 6.  Phone: [phone]

## 2024-03-07 ENCOUNTER — Ambulatory Visit: Payer: MEDICAID | Admitting: Family Medicine

## 2024-03-09 ENCOUNTER — Inpatient Hospital Stay (HOSPITAL_COMMUNITY)
Admission: EM | Admit: 2024-03-09 | Discharge: 2024-03-14 | DRG: 291 | Disposition: A | Discharge status: Home / Self Care | Payer: MEDICAID | Attending: Internal Medicine | Admitting: Internal Medicine

## 2024-03-09 ENCOUNTER — Other Ambulatory Visit: Payer: Self-pay

## 2024-03-09 ENCOUNTER — Emergency Department (HOSPITAL_COMMUNITY): Payer: MEDICAID

## 2024-03-09 ENCOUNTER — Inpatient Hospital Stay (HOSPITAL_COMMUNITY): Payer: MEDICAID

## 2024-03-09 DIAGNOSIS — Z7951 Long term (current) use of inhaled steroids: Secondary | ICD-10-CM

## 2024-03-09 DIAGNOSIS — Z1152 Encounter for screening for COVID-19: Secondary | ICD-10-CM

## 2024-03-09 DIAGNOSIS — B974 Respiratory syncytial virus as the cause of diseases classified elsewhere: Secondary | ICD-10-CM | POA: Diagnosis present

## 2024-03-09 DIAGNOSIS — Z8249 Family history of ischemic heart disease and other diseases of the circulatory system: Secondary | ICD-10-CM | POA: Diagnosis not present

## 2024-03-09 DIAGNOSIS — G4730 Sleep apnea, unspecified: Secondary | ICD-10-CM | POA: Diagnosis present

## 2024-03-09 DIAGNOSIS — Z6841 Body Mass Index (BMI) 40.0 and over, adult: Secondary | ICD-10-CM | POA: Diagnosis not present

## 2024-03-09 DIAGNOSIS — T380X5A Adverse effect of glucocorticoids and synthetic analogues, initial encounter: Secondary | ICD-10-CM | POA: Diagnosis present

## 2024-03-09 DIAGNOSIS — E876 Hypokalemia: Secondary | ICD-10-CM | POA: Diagnosis not present

## 2024-03-09 DIAGNOSIS — I5033 Acute on chronic diastolic (congestive) heart failure: Secondary | ICD-10-CM | POA: Diagnosis present

## 2024-03-09 DIAGNOSIS — G4733 Obstructive sleep apnea (adult) (pediatric): Secondary | ICD-10-CM | POA: Diagnosis present

## 2024-03-09 DIAGNOSIS — F419 Anxiety disorder, unspecified: Secondary | ICD-10-CM | POA: Diagnosis present

## 2024-03-09 DIAGNOSIS — J4541 Moderate persistent asthma with (acute) exacerbation: Secondary | ICD-10-CM | POA: Diagnosis present

## 2024-03-09 DIAGNOSIS — Z5901 Sheltered homelessness: Secondary | ICD-10-CM

## 2024-03-09 DIAGNOSIS — Z79899 Other long term (current) drug therapy: Secondary | ICD-10-CM | POA: Diagnosis not present

## 2024-03-09 DIAGNOSIS — R0602 Shortness of breath: Secondary | ICD-10-CM | POA: Diagnosis present

## 2024-03-09 DIAGNOSIS — J45901 Unspecified asthma with (acute) exacerbation: Secondary | ICD-10-CM | POA: Diagnosis not present

## 2024-03-09 DIAGNOSIS — F32A Depression, unspecified: Secondary | ICD-10-CM | POA: Diagnosis present

## 2024-03-09 DIAGNOSIS — I509 Heart failure, unspecified: Secondary | ICD-10-CM

## 2024-03-09 DIAGNOSIS — E66813 Obesity, class 3: Secondary | ICD-10-CM | POA: Diagnosis present

## 2024-03-09 DIAGNOSIS — I10 Essential (primary) hypertension: Secondary | ICD-10-CM | POA: Diagnosis present

## 2024-03-09 DIAGNOSIS — I110 Hypertensive heart disease with heart failure: Secondary | ICD-10-CM | POA: Diagnosis present

## 2024-03-09 DIAGNOSIS — R7303 Prediabetes: Secondary | ICD-10-CM | POA: Diagnosis present

## 2024-03-09 DIAGNOSIS — J9601 Acute respiratory failure with hypoxia: Secondary | ICD-10-CM | POA: Diagnosis present

## 2024-03-09 DIAGNOSIS — R0609 Other forms of dyspnea: Secondary | ICD-10-CM | POA: Diagnosis not present

## 2024-03-09 DIAGNOSIS — Z791 Long term (current) use of non-steroidal anti-inflammatories (NSAID): Secondary | ICD-10-CM

## 2024-03-09 DIAGNOSIS — Z91048 Other nonmedicinal substance allergy status: Secondary | ICD-10-CM | POA: Diagnosis not present

## 2024-03-09 DIAGNOSIS — Z91013 Allergy to seafood: Secondary | ICD-10-CM | POA: Diagnosis not present

## 2024-03-09 DIAGNOSIS — Z833 Family history of diabetes mellitus: Secondary | ICD-10-CM

## 2024-03-09 LAB — CBC
HCT: 42.9 % (ref 39.0–52.0)
Hemoglobin: 14.3 g/dL (ref 13.0–17.0)
MCH: 27.9 pg (ref 26.0–34.0)
MCHC: 33.3 g/dL (ref 30.0–36.0)
MCV: 83.6 fL (ref 80.0–100.0)
Platelets: 271 K/uL (ref 150–400)
RBC: 5.13 MIL/uL (ref 4.22–5.81)
RDW: 13.2 % (ref 11.5–15.5)
WBC: 9.8 K/uL (ref 4.0–10.5)
nRBC: 0 % (ref 0.0–0.2)

## 2024-03-09 LAB — URINALYSIS, ROUTINE W REFLEX MICROSCOPIC
Bacteria, UA: NONE SEEN
Bilirubin Urine: NEGATIVE
Glucose, UA: NEGATIVE mg/dL
Ketones, ur: NEGATIVE mg/dL
Leukocytes,Ua: NEGATIVE
Nitrite: NEGATIVE
Protein, ur: NEGATIVE mg/dL
Specific Gravity, Urine: 1.009 (ref 1.005–1.030)
pH: 5 (ref 5.0–8.0)

## 2024-03-09 LAB — CREATININE, SERUM
Creatinine, Ser: 0.94 mg/dL (ref 0.61–1.24)
GFR, Estimated: >60 mL/min

## 2024-03-09 LAB — COMPREHENSIVE METABOLIC PANEL WITH GFR
ALT: 19 U/L (ref 0–44)
AST: 29 U/L (ref 15–41)
Albumin: 3.6 g/dL (ref 3.5–5.0)
Alkaline Phosphatase: 88 U/L (ref 38–126)
Anion gap: 12 (ref 5–15)
BUN: 13 mg/dL (ref 6–20)
CO2: 28 mmol/L (ref 22–32)
Calcium: 8.5 mg/dL — ABNORMAL LOW (ref 8.9–10.3)
Chloride: 101 mmol/L (ref 98–111)
Creatinine, Ser: 0.97 mg/dL (ref 0.61–1.24)
GFR, Estimated: >60 mL/min
Glucose, Bld: 176 mg/dL — ABNORMAL HIGH (ref 70–99)
Potassium: 3.3 mmol/L — ABNORMAL LOW (ref 3.5–5.1)
Sodium: 140 mmol/L (ref 135–145)
Total Bilirubin: 0.8 mg/dL (ref 0.0–1.2)
Total Protein: 6.8 g/dL (ref 6.5–8.1)

## 2024-03-09 LAB — CBC WITH DIFFERENTIAL/PLATELET
Abs Immature Granulocytes: 0.03 K/uL (ref 0.00–0.07)
Basophils Absolute: 0.1 K/uL (ref 0.0–0.1)
Basophils Relative: 1 %
Eosinophils Absolute: 0.5 K/uL (ref 0.0–0.5)
Eosinophils Relative: 5 %
HCT: 41.3 % (ref 39.0–52.0)
Hemoglobin: 14.2 g/dL (ref 13.0–17.0)
Immature Granulocytes: 0 %
Lymphocytes Relative: 11 %
Lymphs Abs: 1 K/uL (ref 0.7–4.0)
MCH: 28 pg (ref 26.0–34.0)
MCHC: 34.4 g/dL (ref 30.0–36.0)
MCV: 81.5 fL (ref 80.0–100.0)
Monocytes Absolute: 1 K/uL (ref 0.1–1.0)
Monocytes Relative: 10 %
Neutro Abs: 6.9 K/uL (ref 1.7–7.7)
Neutrophils Relative %: 73 %
Platelets: 270 K/uL (ref 150–400)
RBC: 5.07 MIL/uL (ref 4.22–5.81)
RDW: 13.2 % (ref 11.5–15.5)
WBC: 9.5 K/uL (ref 4.0–10.5)
nRBC: 0 % (ref 0.0–0.2)

## 2024-03-09 LAB — I-STAT VENOUS BLOOD GAS, ED
Acid-Base Excess: 9 mmol/L — ABNORMAL HIGH (ref 0.0–2.0)
Bicarbonate: 32.5 mmol/L — ABNORMAL HIGH (ref 20.0–28.0)
Calcium, Ion: 0.98 mmol/L — ABNORMAL LOW (ref 1.15–1.40)
HCT: 41 % (ref 39.0–52.0)
Hemoglobin: 13.9 g/dL (ref 13.0–17.0)
O2 Saturation: 98 %
Potassium: 3.9 mmol/L (ref 3.5–5.1)
Sodium: 138 mmol/L (ref 135–145)
TCO2: 34 mmol/L — ABNORMAL HIGH (ref 22–32)
pCO2, Ven: 38.8 mmHg — ABNORMAL LOW (ref 44–60)
pH, Ven: 7.532 — ABNORMAL HIGH (ref 7.25–7.43)
pO2, Ven: 98 mmHg — ABNORMAL HIGH (ref 32–45)

## 2024-03-09 LAB — RESP PANEL BY RT-PCR (RSV, FLU A&B, COVID)  RVPGX2
Influenza A by PCR: NEGATIVE
Influenza B by PCR: NEGATIVE
Resp Syncytial Virus by PCR: POSITIVE — AB
SARS Coronavirus 2 by RT PCR: NEGATIVE

## 2024-03-09 LAB — I-STAT CG4 LACTIC ACID, ED
Lactic Acid, Venous: 1.7 mmol/L (ref 0.5–1.9)
Lactic Acid, Venous: 1.8 mmol/L (ref 0.5–1.9)

## 2024-03-09 LAB — TROPONIN T, HIGH SENSITIVITY
Troponin T High Sensitivity: 20 ng/L — ABNORMAL HIGH (ref 0–19)
Troponin T High Sensitivity: 21 ng/L — ABNORMAL HIGH (ref 0–19)

## 2024-03-09 LAB — HIV ANTIBODY (ROUTINE TESTING W REFLEX): HIV Screen 4th Generation wRfx: NONREACTIVE

## 2024-03-09 LAB — PRO BRAIN NATRIURETIC PEPTIDE: Pro Brain Natriuretic Peptide: 872 pg/mL — ABNORMAL HIGH

## 2024-03-09 MED ORDER — TRAZODONE HCL 50 MG PO TABS
50.0000 mg | ORAL_TABLET | Freq: Every day | ORAL | Status: DC
  Administered 2024-03-09 – 2024-03-12 (×4): 50 mg via ORAL
  Filled 2024-03-09 (×6): qty 1

## 2024-03-09 MED ORDER — FUROSEMIDE 10 MG/ML IJ SOLN
40.0000 mg | Freq: Once | INTRAMUSCULAR | Status: AC
  Administered 2024-03-09: 40 mg via INTRAVENOUS
  Filled 2024-03-09: qty 4

## 2024-03-09 MED ORDER — ENOXAPARIN SODIUM 40 MG/0.4ML IJ SOSY
40.0000 mg | PREFILLED_SYRINGE | INTRAMUSCULAR | Status: DC
  Administered 2024-03-09: 40 mg via SUBCUTANEOUS
  Filled 2024-03-09: qty 0.4

## 2024-03-09 MED ORDER — MAGNESIUM SULFATE 2 GM/50ML IV SOLN
2.0000 g | Freq: Once | INTRAVENOUS | Status: AC
  Administered 2024-03-09: 2 g via INTRAVENOUS
  Filled 2024-03-09: qty 50

## 2024-03-09 MED ORDER — IPRATROPIUM-ALBUTEROL 0.5-2.5 (3) MG/3ML IN SOLN
3.0000 mL | Freq: Once | RESPIRATORY_TRACT | Status: AC
  Administered 2024-03-09: 3 mL via RESPIRATORY_TRACT
  Filled 2024-03-09: qty 3

## 2024-03-09 MED ORDER — METHYLPREDNISOLONE SODIUM SUCC 125 MG IJ SOLR
125.0000 mg | INTRAMUSCULAR | Status: AC
  Administered 2024-03-09: 125 mg via INTRAVENOUS
  Filled 2024-03-09: qty 2

## 2024-03-09 MED ORDER — SERTRALINE HCL 100 MG PO TABS
200.0000 mg | ORAL_TABLET | Freq: Every day | ORAL | Status: DC
  Administered 2024-03-09 – 2024-03-13 (×5): 200 mg via ORAL
  Filled 2024-03-09 (×5): qty 2

## 2024-03-09 MED ORDER — IPRATROPIUM-ALBUTEROL 0.5-2.5 (3) MG/3ML IN SOLN: 3.0000 mL | Freq: Four times a day (QID) | RESPIRATORY_TRACT | Status: DC | PRN

## 2024-03-09 MED ORDER — FLUTICASONE FUROATE-VILANTEROL 200-25 MCG/ACT IN AEPB
1.0000 | INHALATION_SPRAY | Freq: Every day | RESPIRATORY_TRACT | Status: DC
  Administered 2024-03-10 – 2024-03-14 (×5): 1 via RESPIRATORY_TRACT
  Filled 2024-03-09 (×2): qty 28

## 2024-03-09 MED ORDER — FUROSEMIDE 10 MG/ML IJ SOLN
40.0000 mg | Freq: Two times a day (BID) | INTRAMUSCULAR | Status: DC
  Administered 2024-03-10: 40 mg via INTRAVENOUS
  Filled 2024-03-09: qty 4

## 2024-03-09 MED ORDER — BUSPIRONE HCL 5 MG PO TABS
7.5000 mg | ORAL_TABLET | Freq: Two times a day (BID) | ORAL | Status: DC
  Administered 2024-03-09 – 2024-03-14 (×10): 7.5 mg via ORAL
  Filled 2024-03-09 (×10): qty 2

## 2024-03-09 NOTE — ED Notes (Signed)
 CCMD called; Pt. Is on the monitor

## 2024-03-09 NOTE — H&P (Addendum)
 " History and Physical    Patient: Christian [name] FMW:[id number] DOB: [birth year] DOA: 03/09/2024 DOS: the patient was seen and examined on 03/09/2024 PCP: Tanda Bleacher, MD  Patient coming from: Homeless  Chief Complaint:  Chief Complaint  Patient presents with   Shortness of Breath   HPI: Christian [name] is a 49 y.o. male with medical history significant of HFpEF (EF 60-65% in 05/2022), OSA, depression/anxiety, and morbid obesity who p/w AHRF iso RSV and volume overload from HFrEF exacerbation.  Pt reports SOB for the past few days. He notes that he visited a friend and ended up staying longer than anticipated and ran out of his medications including diuretics and inhalers; as such, his breathing worsening over the past 3 days so he presented to the ED for evaluation.  In the ED, pt tachypneic w/ hypoxia (on 4L [place]; on RA pta). Labs notable for pro-BNP 872. RSV positive on nasal swab. CXR showed pulmonary vascular congestion with interstitial prominence bilaterally, possible edema or infiltrate. EDP requested admission for heart failure.   Review of Systems: As mentioned in the history of present illness. All other systems reviewed and are negative. Past Medical History:  Diagnosis Date   Allergic rhinitis    Anxiety    CHF (congestive heart failure) (HCC)    Depression    Depression    Elevated blood pressure reading without diagnosis of hypertension    History of chicken pox    Obesity    Perforated bowel (HCC)    Pleural effusion    Sepsis (HCC)    Sleep apnea    O2 at night   Past Surgical History:  Procedure Laterality Date   COLON SURGERY     perforated bowel   TONSILLECTOMY AND ADENOIDECTOMY  03/01/1983   Social History:  reports that he has never smoked. He has been exposed to tobacco smoke. He has never used smokeless tobacco. He reports that he does not currently use alcohol. He reports that he does not use drugs.  Allergies[1]  Family History  Problem  Relation Age of Onset   Stroke Mother    Diabetes Mother        pre-diabetes   Heart disease Mother        CHF, pacer   Cancer Mother        lymphoma   Stomach cancer Mother    Clotting disorder Mother    Coronary artery disease Father 45       MI   Hypertension Father    Hyperlipidemia Father    Diabetes Father    Heart attack Father    Migraines Sister    Stroke Paternal Grandmother    Diabetes Paternal Grandmother    Heart disease Paternal Grandfather    Parkinson's disease Paternal Grandfather    Heart attack Paternal Grandfather     Prior to Admission medications  Medication Sig Start Date End Date Taking? Authorizing Provider  acetaminophen  (TYLENOL ) 500 MG tablet Take 1 tablet (500 mg total) by mouth every 6 (six) hours as needed. Patient taking differently: Take 1,000 mg by mouth every 6 (six) hours as needed for moderate pain (pain score 4-6) or mild pain (pain score 1-3). 11/08/23  Yes Nivia Colon, PA-C  albuterol  (VENTOLIN  HFA) 108 (90 Base) MCG/ACT inhaler Inhale 1-2 puffs into the lungs every 6 (six) hours as needed for wheezing or shortness of breath. 07/08/23  Yes Reddick, Johnathan B, NP  busPIRone  (BUSPAR ) 7.5 MG tablet Take 1 tablet (7.5 mg  total) by mouth 2 (two) times daily. 12/09/23  Yes Nwoko, Uchenna E, PA  cetirizine (ZYRTEC) 10 MG tablet Take 10 mg by mouth daily.   Yes [provider]  fluticasone  (FLONASE ) 50 MCG/ACT nasal spray Spray 2 sprays into both nostrils once daily. 12/05/23  Yes Tanda Bleacher, MD  fluticasone -salmeterol (ADVAIR  HFA) 230-21 MCG/ACT inhaler Inhale 2 puffs into the lungs 2 (two) times daily. 09/07/23  Yes Olalere, Adewale A, MD  meloxicam  (MOBIC ) 15 MG tablet Take 1 tablet (15 mg total) by mouth daily. 02/08/24  Yes Gretta Bertrum ORN, PA-C  Multiple Vitamin (MULTIVITAMIN PO) Take 1 tablet by mouth daily.   Yes [provider]  phentermine  (ADIPEX-P ) 37.5 MG tablet Take 1 tablet (37.5 mg total) by mouth daily before  breakfast. 01/19/24  Yes Tanda Bleacher, MD  sertraline  (ZOLOFT ) 100 MG tablet Take 2 tablets (200 mg total) by mouth at bedtime. 12/09/23  Yes Nwoko, Uchenna E, PA  torsemide  (DEMADEX ) 20 MG tablet Take 2 tablets (40 mg total) by mouth 2 (two) times daily. 07/28/23 05/09/24 Yes [name] Jackee VEAR Mickey., NP  traZODone  (DESYREL ) 50 MG tablet Take 1 tablet (50 mg total) by mouth at bedtime. 12/09/23  Yes Collene Reginia BRAVO, PA    Physical Exam: Vitals:   03/09/24 1840 03/09/24 2000 03/09/24 2100 03/09/24 2131  BP:  124/84 (!) 152/94 (!) 153/82  Pulse:  (!) 103 94 91  Resp:  16 19 (!) 22  Temp: 99.2 F (37.3 C)   98.4 F (36.9 C)  TempSrc: Oral   Oral  SpO2:  91% 92% 97%  Weight:    (!) 187.3 kg  Height:    6' 2 (1.88 m)   General: Alert, oriented x3, resting comfortably in no acute distress Respiratory: Bibasilar rales; diffuse rhonchi Cardiovascular: Regular rate and rhythm w/o m/r/g Abdomen: Soft, nontender, nondistended. Positive bowel sounds   Data Reviewed:  Lab Results  Component Value Date   WBC 9.8 03/09/2024   HGB 14.3 03/09/2024   HCT 42.9 03/09/2024   MCV 83.6 03/09/2024   PLT 271 03/09/2024   Lab Results  Component Value Date   GLUCOSE 176 (H) 03/09/2024   CALCIUM  8.5 (L) 03/09/2024   NA 138 03/09/2024   K 3.9 03/09/2024   CO2 28 03/09/2024   CL 101 03/09/2024   BUN 13 03/09/2024   CREATININE 0.94 03/09/2024   Lab Results  Component Value Date   ALT 19 03/09/2024   AST 29 03/09/2024   ALKPHOS 88 03/09/2024   BILITOT 0.8 03/09/2024   Lab Results  Component Value Date   INR 1.1 11/19/2022   INR 1.1 01/08/2021   INR 1.2 10/10/2020   Radiology: [facility] Chest Port 1 View Result Date: 03/09/2024 CLINICAL DATA:  Shortness of breath. EXAM: PORTABLE CHEST 1 VIEW COMPARISON:  11/08/2023, 07/08/2023. FINDINGS: The heart size and mediastinal contours are within normal limits. The pulmonary vasculature is distended. Interstitial prominence is present bilaterally. No  consolidation, effusion, or pneumothorax is seen. No acute osseous abnormality. IMPRESSION: Pulmonary vascular congestion with interstitial prominence bilaterally, possible edema or infiltrate. Electronically Signed   By: Leita Birmingham M.D.   On: 03/09/2024 15:48    Assessment and Plan: 27M h/o HFpEF (EF 60-65% in 05/2022), OSA, depression/anxiety, and morbid obesity who p/w AHRF iso RSV and volume overload from HFrEF exacerbation.  HFpEF exacerbation -IV lasix  40mg  BID for now; goal net neg 1-2L/d; strict I/Os; daily standing weights; K>4/Mg>2  RSV -Continue conservative mgt -Supplemental O2 prn -  F/u CT chest to exclude CAP  OSA -CPAP at bedtime  Anxiety/depression -PTA Buspar  and sertraline    Advance Care Planning:   Code Status: Full Code   Consults: N/A  Family Communication: N/A  Severity of Illness: The appropriate patient status for this patient is INPATIENT. Inpatient status is judged to be reasonable and necessary in order to provide the required intensity of service to ensure the patient's safety. The patient's presenting symptoms, physical exam findings, and initial radiographic and laboratory data in the context of their chronic comorbidities is felt to place them at high risk for further clinical deterioration. Furthermore, it is not anticipated that the patient will be medically stable for discharge from the hospital within 2 midnights of admission.   * I certify that at the point of admission it is my clinical judgment that the patient will require inpatient hospital care spanning beyond 2 midnights from the point of admission due to high intensity of service, high risk for further deterioration and high frequency of surveillance required.*   ------- I spent 56 minutes reviewing previous notes, at the bedside counseling/discussing the treatment plan, and performing clinical documentation.  Author: Marsha Ada, MD 03/09/2024 10:50 PM  For on call review  [email or web address].      [1]  Allergies Allergen Reactions   Shellfish Allergy Nausea Only   Tape Rash    Prefers paper tape   "

## 2024-03-09 NOTE — ED Provider Notes (Signed)
 " [place] EMERGENCY DEPARTMENT AT [facility] Provider Note   CSN: [id number] Arrival date & time: 03/09/24  1402     Patient presents with: Shortness of Breath   Christian [name] is a 49 y.o. male.   HPI Patient reports has had a cough for about 3 days.  He reports he feels like he has a lot of chest congestion but he cannot cough it out.  He denies a significant amount of chest pain.  He reports he has had chills and aches and hot flashes.  Has not had a fever that he is identified.  He reports he is staying at a homeless shelter right now and using his CPAP but he does not have any oxygen to use with it.  At baseline however he does not wear continuous oxygen.  Patient denies he has any significant swelling of his legs.  He reports he does however have history of heart failure and has not had his medication recently.  No abdominal pain or vomiting.  He reports sometimes however when he coughs a lot he gets nauseated.  Patient reports he does have history of asthma.  He does not smoke.  No history of COPD.  He reports he does have inhalers for asthma but does not have any right now.  EMS placed patient on 4 L oxygen.    Prior to Admission medications  Medication Sig Start Date End Date Taking? Authorizing Provider  acetaminophen  (TYLENOL ) 500 MG tablet Take 1 tablet (500 mg total) by mouth every 6 (six) hours as needed. 11/08/23   Nivia Colon, PA-C  albuterol  (VENTOLIN  HFA) 108 (90 Base) MCG/ACT inhaler Inhale 1-2 puffs into the lungs every 6 (six) hours as needed for wheezing or shortness of breath. 07/08/23   Reddick, Johnathan B, NP  busPIRone  (BUSPAR ) 7.5 MG tablet Take 1 tablet (7.5 mg total) by mouth 2 (two) times daily. 12/09/23   Nwoko, Uchenna E, PA  fluticasone  (FLONASE ) 50 MCG/ACT nasal spray Spray 2 sprays into both nostrils once daily. 12/05/23   Tanda Bleacher, MD  fluticasone -salmeterol (ADVAIR  HFA) 230-21 MCG/ACT inhaler Inhale 2 puffs into the lungs 2 (two) times  daily. 09/07/23   Olalere, Jennet LABOR, MD  meloxicam  (MOBIC ) 15 MG tablet Take 1 tablet (15 mg total) by mouth daily. 02/08/24   Gretta Bertrum ORN, PA-C  Multiple Vitamin (MULTIVITAMIN PO) Take by mouth.    [provider]  phentermine  (ADIPEX-P ) 37.5 MG tablet Take 1 tablet (37.5 mg total) by mouth daily before breakfast. 01/19/24   Tanda Bleacher, MD  sertraline  (ZOLOFT ) 100 MG tablet Take 2 tablets (200 mg total) by mouth at bedtime. 12/09/23   Nwoko, Uchenna E, PA  torsemide  (DEMADEX ) 20 MG tablet Take 2 tablets (40 mg total) by mouth 2 (two) times daily. 07/28/23 05/09/24  [name] Jackee VEAR Mickey., NP  traZODone  (DESYREL ) 50 MG tablet Take 1 tablet (50 mg total) by mouth at bedtime. 12/09/23   Nwoko, Uchenna E, PA    Allergies: Shellfish allergy    Review of Systems  Updated Vital Signs BP 134/70   Pulse 93   Temp 99 F (37.2 C) (Oral)   Resp (!) 22   SpO2 98%   Physical Exam Constitutional:      Comments: Alert with clear mental status.  Mild increased work of breathing at rest.  HENT:     Mouth/Throat:     Pharynx: Oropharynx is clear.  Cardiovascular:     Rate and Rhythm: Normal  rate and regular rhythm.  Pulmonary:     Comments: Mild increased work of breathing.  Occasional cough.  Patient has some expiratory wheeze and crackles at the lung bases.  Adequate airflow. Abdominal:     General: There is no distension.     Palpations: Abdomen is soft.     Tenderness: There is no abdominal tenderness. There is no guarding.  Musculoskeletal:     Comments: Lower extremities are symmetric without calf pain.  1+ edema bilaterally.  Skin:    General: Skin is warm and dry.  Neurological:     General: No focal deficit present.     Mental Status: He is oriented to person, place, and time.     Motor: No weakness.     Coordination: Coordination normal.  Psychiatric:        Mood and Affect: Mood normal.     (all labs ordered are listed, but only abnormal results are  displayed) Labs Reviewed  I-STAT VENOUS BLOOD GAS, ED - Abnormal; Notable for the following components:      Result Value   pH, Ven 7.532 (*)    pCO2, Ven 38.8 (*)    pO2, Ven 98 (*)    Bicarbonate 32.5 (*)    TCO2 34 (*)    Acid-Base Excess 9.0 (*)    Calcium , Ion 0.98 (*)    All other components within normal limits  CULTURE, BLOOD (ROUTINE X 2)  CULTURE, BLOOD (ROUTINE X 2)  RESP PANEL BY RT-PCR (RSV, FLU A&B, COVID)  RVPGX2  CBC WITH DIFFERENTIAL/PLATELET  PRO BRAIN NATRIURETIC PEPTIDE  COMPREHENSIVE METABOLIC PANEL WITH GFR  URINALYSIS, ROUTINE W REFLEX MICROSCOPIC  I-STAT CG4 LACTIC ACID, ED  TROPONIN T, HIGH SENSITIVITY    EKG: EKG Interpretation Date/Time:  Saturday March 09 2024 14:43:45 EST Ventricular Rate:  93 PR Interval:  144 QRS Duration:  119 QT Interval:  378 QTC Calculation: 471 R Axis:   -51  Text Interpretation: Sinus rhythm LAD, consider left anterior fascicular block Low voltage, precordial leads Confirmed by Dasie Faden ([id number]) on 03/09/2024 3:25:46 PM  Radiology: No results found.   Procedures   Medications Ordered in the ED  methylPREDNISolone  sodium succinate (SOLU-MEDROL ) 125 mg/2 mL injection 125 mg (has no administration in time range)  magnesium  sulfate IVPB 2 g 50 mL (has no administration in time range)  ipratropium-albuterol  (DUONEB) 0.5-2.5 (3) MG/3ML nebulizer solution 3 mL (3 mLs Nebulization Given 03/09/24 1502)                                    Medical Decision Making Amount and/or Complexity of Data Reviewed Labs: ordered. Radiology: ordered.  Risk Prescription drug management.   Patient presents as outlined with increasing shortness of breath, wheeze and cough over 3 days duration.  Patient does have mild increased work of breathing and was placed on supplemental oxygen by EMS.  At this time he does have some wheezing present will proceed with nebulizer therapy.  Differential diagnosis includes viral  pneumonia\bacterial pneumonia\asthma exacerbation with bronchitis.   CBC normal.  Lactic 1.7.  pH 7.5 pCO2 38  Chest x-ray and additional lab work pending.  I anticipate patient will most likely need admission with asthma exacerbation and possible viral versus bacterial pneumonia.  At this time Dr. Dale will assume care and has added Solu-Medrol  and magnesium  and definitive disposition pending completion of diagnostic workup and therapeutic treatment.  Final diagnoses:  Shortness of breath  Moderate persistent asthma with exacerbation    ED Discharge Orders     None          Armenta Canning, MD 03/09/24 1546  "

## 2024-03-09 NOTE — ED Notes (Signed)
 Dinner tray ordered.

## 2024-03-09 NOTE — ED Triage Notes (Signed)
 Pt. BIB [facility] from hotel with c/o [facility]; Pt. Stated that it strated last night when he felt hot flashes and short of breath and it has been going on since then. Pt. Has a Hx of HF and is noncompliant with prescribed lasix ; Per [facility] pt. Has also not been using CPAP machine recently due to homelessness. Pt. Is not typically on oxygen.    VS per [facility]:  BP: 147/92 SpO2: 94% 4L HR: 90 18G in L FA

## 2024-03-09 NOTE — ED Notes (Signed)
 Charge nurse on 6E stated they are ready for patient.

## 2024-03-09 NOTE — ED Provider Notes (Signed)
 Patient signed out to me by Dr. Ismael pending results of his workup.  Patient looks like he has CHF.  Given Lasix  IV push.  Does require admission and will consult hospitalist team   Christian Faden, MD 03/09/24 [phone]

## 2024-03-10 ENCOUNTER — Encounter (HOSPITAL_COMMUNITY): Payer: Self-pay | Admitting: Hospitalist

## 2024-03-10 DIAGNOSIS — I10 Essential (primary) hypertension: Secondary | ICD-10-CM | POA: Diagnosis not present

## 2024-03-10 DIAGNOSIS — J45901 Unspecified asthma with (acute) exacerbation: Secondary | ICD-10-CM | POA: Diagnosis not present

## 2024-03-10 DIAGNOSIS — R7303 Prediabetes: Secondary | ICD-10-CM | POA: Diagnosis not present

## 2024-03-10 DIAGNOSIS — E66813 Obesity, class 3: Secondary | ICD-10-CM | POA: Diagnosis not present

## 2024-03-10 DIAGNOSIS — I5033 Acute on chronic diastolic (congestive) heart failure: Secondary | ICD-10-CM

## 2024-03-10 DIAGNOSIS — F32A Depression, unspecified: Secondary | ICD-10-CM | POA: Diagnosis not present

## 2024-03-10 DIAGNOSIS — E876 Hypokalemia: Secondary | ICD-10-CM

## 2024-03-10 LAB — GLUCOSE, CAPILLARY
Glucose-Capillary: 218 mg/dL — ABNORMAL HIGH (ref 70–99)
Glucose-Capillary: 199 mg/dL — ABNORMAL HIGH (ref 70–99)

## 2024-03-10 LAB — CBC
HCT: 42.7 % (ref 39.0–52.0)
Hemoglobin: 14.6 g/dL (ref 13.0–17.0)
MCH: 27.9 pg (ref 26.0–34.0)
MCHC: 34.2 g/dL (ref 30.0–36.0)
MCV: 81.5 fL (ref 80.0–100.0)
Platelets: 290 K/uL (ref 150–400)
RBC: 5.24 MIL/uL (ref 4.22–5.81)
RDW: 13.2 % (ref 11.5–15.5)
WBC: 8.4 K/uL (ref 4.0–10.5)
nRBC: 0 % (ref 0.0–0.2)

## 2024-03-10 LAB — BASIC METABOLIC PANEL WITH GFR
Anion gap: 11 (ref 5–15)
BUN: 15 mg/dL (ref 6–20)
CO2: 28 mmol/L (ref 22–32)
Calcium: 8.8 mg/dL — ABNORMAL LOW (ref 8.9–10.3)
Chloride: 100 mmol/L (ref 98–111)
Creatinine, Ser: 1.08 mg/dL (ref 0.61–1.24)
GFR, Estimated: >60 mL/min
Glucose, Bld: 223 mg/dL — ABNORMAL HIGH (ref 70–99)
Potassium: 3.2 mmol/L — ABNORMAL LOW (ref 3.5–5.1)
Sodium: 139 mmol/L (ref 135–145)

## 2024-03-10 LAB — HEMOGLOBIN A1C
Hgb A1c MFr Bld: 6 % — ABNORMAL HIGH (ref 4.8–5.6)
Mean Plasma Glucose: 125.5 mg/dL

## 2024-03-10 MED ORDER — METHYLPREDNISOLONE SODIUM SUCC 40 MG IJ SOLR
40.0000 mg | Freq: Every day | INTRAMUSCULAR | Status: DC
  Administered 2024-03-10 – 2024-03-13 (×4): 40 mg via INTRAVENOUS
  Filled 2024-03-10 (×4): qty 1

## 2024-03-10 MED ORDER — FUROSEMIDE 10 MG/ML IJ SOLN
60.0000 mg | Freq: Two times a day (BID) | INTRAMUSCULAR | Status: DC
  Administered 2024-03-10 – 2024-03-12 (×4): 60 mg via INTRAVENOUS
  Filled 2024-03-10 (×4): qty 6

## 2024-03-10 MED ORDER — AZITHROMYCIN 250 MG PO TABS
500.0000 mg | ORAL_TABLET | Freq: Every day | ORAL | Status: DC
  Administered 2024-03-10 – 2024-03-12 (×3): 500 mg via ORAL
  Filled 2024-03-10 (×3): qty 2

## 2024-03-10 MED ORDER — ALBUTEROL SULFATE (2.5 MG/3ML) 0.083% IN NEBU: 2.5000 mg | INHALATION_SOLUTION | RESPIRATORY_TRACT | Status: DC | PRN

## 2024-03-10 MED ORDER — ENOXAPARIN SODIUM 100 MG/ML IJ SOSY
100.0000 mg | PREFILLED_SYRINGE | INTRAMUSCULAR | Status: DC
  Administered 2024-03-10 – 2024-03-13 (×4): 100 mg via SUBCUTANEOUS
  Filled 2024-03-10 (×6): qty 1

## 2024-03-10 MED ORDER — GUAIFENESIN-DM 100-10 MG/5ML PO SYRP
5.0000 mL | ORAL_SOLUTION | ORAL | Status: DC | PRN
  Filled 2024-03-10: qty 5

## 2024-03-10 MED ORDER — POTASSIUM CHLORIDE CRYS ER 20 MEQ PO TBCR
40.0000 meq | EXTENDED_RELEASE_TABLET | ORAL | Status: AC
  Administered 2024-03-10 (×2): 40 meq via ORAL
  Filled 2024-03-10 (×2): qty 2

## 2024-03-10 MED ORDER — IPRATROPIUM-ALBUTEROL 0.5-2.5 (3) MG/3ML IN SOLN
3.0000 mL | Freq: Four times a day (QID) | RESPIRATORY_TRACT | Status: DC
  Administered 2024-03-10 – 2024-03-11 (×2): 3 mL via RESPIRATORY_TRACT
  Filled 2024-03-10 (×2): qty 3

## 2024-03-10 MED ORDER — ORAL CARE MOUTH RINSE: 15.0000 mL | OROMUCOSAL | Status: DC | PRN

## 2024-03-10 MED ORDER — INSULIN ASPART 100 UNIT/ML IJ SOLN
0.0000 [IU] | Freq: Three times a day (TID) | INTRAMUSCULAR | Status: DC
  Administered 2024-03-10 – 2024-03-11 (×3): 3 [IU] via SUBCUTANEOUS
  Administered 2024-03-12: 2 [IU] via SUBCUTANEOUS
  Administered 2024-03-12 – 2024-03-13 (×3): 1 [IU] via SUBCUTANEOUS
  Filled 2024-03-10: qty 3
  Filled 2024-03-10: qty 2
  Filled 2024-03-10: qty 1
  Filled 2024-03-10 (×2): qty 3
  Filled 2024-03-10 (×2): qty 1

## 2024-03-10 NOTE — Assessment & Plan Note (Signed)
 Calculated BMI is 53,0

## 2024-03-10 NOTE — Evaluation (Addendum)
 Physical Therapy Evaluation Patient Details Name: Christian [name] MRN: [id number] DOB: 03/13/1975 Today's Date: 03/10/2024  History of Present Illness  49 y.o. male presents to [facility] 03/09/24 with RSV and HFrEF exacerbation. PMHx:  HFpEF (EF 60-65% in 05/2022), OSA, depression/anxiety, and morbid obesity  Clinical Impression  PTA pt was staying at a homeless shelter and was ModI for short distances with use of rollator. Pt recently lost hosing placement with desire to work with TOC on securing new housing. Pt presents below mobility baseline with impaired cardiopulmonary status, generalized weakness, fatigue, and decreased activity tolerance. Pt required supervision for bed mobility and CGA to stand with use of rollator. Able to ambulate short distances before needing a seated rest break due to fatigue. Educated on PLB with pt able to demonstrate. Will continue to follow acutely with anticipation of no further PT needs upon d/c.   90% SpO2 on 4L at rest 86-92% SpO2 on 6L during gait 90% SpO2 on 5L after gait      If plan is discharge home, recommend the following: Assist for transportation;Help with stairs or ramp for entrance   Can travel by private vehicle    Yes    Equipment Recommendations None recommended by PT     Functional Status Assessment Patient has had a recent decline in their functional status and demonstrates the ability to make significant improvements in function in a reasonable and predictable amount of time.     Precautions / Restrictions Precautions Precautions: Fall Recall of Precautions/Restrictions: Intact Precaution/Restrictions Comments: watch O2 Restrictions Weight Bearing Restrictions Per Provider Order: No      Mobility  Bed Mobility Overal bed mobility: Needs Assistance Bed Mobility: Supine to Sit    Supine to sit: Supervision, HOB elevated    General bed mobility comments: used momentum with supervision for safety    Transfers Overall  transfer level: Needs assistance Equipment used: Rollator (4 wheels) Transfers: Sit to/from Stand Sit to Stand: Contact guard assist    General transfer comment: CGA with use of momentum to power-up    Ambulation/Gait Ambulation/Gait assistance: Supervision Gait Distance (Feet): 20 Feet (x20, x15, x35) Assistive device: Rollator (4 wheels) Gait Pattern/deviations: Step-through pattern, Decreased stride length, Wide base of support Gait velocity: decr    General Gait Details: Wide BOS with increased medial/lateral sway. Short distances due to fatigue and increased WOB    Balance Overall balance assessment: Needs assistance Sitting-balance support: Feet supported, No upper extremity supported Sitting balance-[name]: Fair     Standing balance support: Bilateral upper extremity supported, During functional activity, Reliant on assistive device for balance Standing balance-[name]: Poor Standing balance comment: reliant on UE support       Pertinent Vitals/Pain Pain Assessment Pain Assessment: Faces Faces Pain Scale: Hurts little more Pain Location: B knees with gait Pain Descriptors / Indicators: Aching, Discomfort Pain Intervention(s): Limited activity within patient's tolerance, Monitored during session, Repositioned    Home Living Family/patient expects to be discharged to:: Shelter/Homeless    Additional Comments: Has lost placement at prior homeless shelter. In process of trying to secure new placement    Prior Function Prior Level of Function : Independent/Modified Independent    Mobility Comments: ModI with rollator for short distances ADLs Comments: reports difficulty with posterior pericare     Extremity/Trunk Assessment   Upper Extremity Assessment Upper Extremity Assessment: Defer to OT evaluation    Lower Extremity Assessment Lower Extremity Assessment: Generalized weakness (BLE edema)    Cervical / Trunk Assessment Cervical /  Trunk  Assessment: Other exceptions Cervical / Trunk Exceptions: increased body habitus  Communication   Communication Communication: No apparent difficulties (verbose)    Cognition Arousal: Alert Behavior During Therapy: WFL for tasks assessed/performed   PT - Cognitive impairments: No apparent impairments    Following commands: Intact       Cueing Cueing Techniques: Verbal cues, Tactile cues      PT Assessment Patient needs continued PT services  PT Problem List Decreased strength;Decreased activity tolerance;Decreased balance;Decreased mobility;Cardiopulmonary status limiting activity       PT Treatment Interventions DME instruction;Gait training;Stair training;Functional mobility training;Therapeutic activities;Therapeutic exercise;Balance training;Neuromuscular re-education;Patient/family education    PT Goals (Current goals can be found in the Care Plan section)  Acute Rehab PT Goals Patient Stated Goal: to feel better PT Goal Formulation: With patient Time For Goal Achievement: 03/24/24 Potential to Achieve Goals: Good    Frequency Min 2X/week        AM-PAC PT 6 Clicks Mobility  Outcome Measure Help needed turning from your back to your side while in a flat bed without using bedrails?: A Little Help needed moving from lying on your back to sitting on the side of a flat bed without using bedrails?: A Little Help needed moving to and from a bed to a chair (including a wheelchair)?: A Little Help needed standing up from a chair using your arms (e.g., wheelchair or bedside chair)?: A Little Help needed to walk in hospital room?: A Little Help needed climbing 3-5 steps with a railing? : A Lot 6 Click Score: 17    End of Session Equipment Utilized During Treatment: Oxygen Activity Tolerance: Patient tolerated treatment well Patient left: in chair;with call bell/phone within reach;with chair alarm set Nurse Communication: Mobility status (SpO2) PT Visit Diagnosis:  Unsteadiness on feet (R26.81);Other abnormalities of gait and mobility (R26.89);Muscle weakness (generalized) (M62.81)    Time: 8567-8551 PT Time Calculation (min) (ACUTE ONLY): 16 min   Charges:   PT Evaluation $PT Eval Low Complexity: 1 Low   PT General Charges $$ ACUTE PT VISIT: 1 Visit        Kate ORN, PT, DPT Secure Chat Preferred  Rehab Office [phone]   Kate BRAVO [name] 03/10/2024, 4:14 PM

## 2024-03-10 NOTE — Assessment & Plan Note (Signed)
 Positive hyperglycemia, steroid induced.   check Hgb A1c. Continue insulin  sliding scale for glucose cover and monitoring

## 2024-03-10 NOTE — Plan of Care (Signed)
" °  Problem: Health Behavior/Discharge Planning: Goal: Ability to manage health-related needs will improve Outcome: Progressing   Problem: Clinical Measurements: Goal: Respiratory complications will improve Outcome: Progressing Goal: Cardiovascular complication will be avoided Outcome: Progressing   Problem: Activity: Goal: Risk for activity intolerance will decrease Outcome: Progressing   Problem: Safety: Goal: Ability to remain free from injury will improve Outcome: Progressing   Problem: Clinical Measurements: Goal: Ability to maintain a body temperature in the normal range will improve Outcome: Progressing   Problem: Respiratory: Goal: Ability to maintain adequate ventilation will improve Outcome: Progressing   "

## 2024-03-10 NOTE — Assessment & Plan Note (Addendum)
 Acute hypoxemic respiratory failure.  Positive for RSV.   Plan to continue bronchodilator therapy with duoneb.  LABA and ICS.  Airway clearing techniques and flutter valve.  Systemic corticosteroids  Antibiotic therapy with azithromycin  for airway inflammation  Out of bed to chair tid with meals Antitussive agents.

## 2024-03-10 NOTE — Progress Notes (Addendum)
 " Progress Note   Patient: Christian [name] FMW:[id number] DOB: February 26, 1976 DOA: 03/09/2024     1 DOS: the patient was seen and examined on 03/10/2024   Brief hospital course: Christian [name] was admitted to the hospital with the working diagnosis of heart failure exacerbation in the setting of RSV viral infection.   49 yo male with the past medical history of heart failure, OSA, obesity and depression who presented with dyspnea. Reported worsening dyspnea for the last 3 days, apparently he has been off medications, including diuretics and inhalers, because housing issues. On the day of admission he had severe symptoms, EMS was called and he was found in respiratory distress with low 02 saturation. He was placed on 4 L/min per [place] of supplemental 02 and was transported to the ED.  On his initial physical examination his blood pressure was 153/82, HR 103, RR 22 and 02 saturation 91%  Lungs with bilateral rales with diffuse rhonchi, heart with S1 and S2 present and regular with no gallops or rubs, abdomen with no distention, soft and non tender, positive lower extremity edema.   VBG 7,53/ 38.8/ 98/ 34/ 98%  Na 140, K 3.3 Cl 101, bicarbonate 28 glucose 176 bun 13 cr 0,97  BNP 872  High sensitive troponin 20 and 21  Lactic acid 1,7 and 1,8  Wbc 9,5 hgb 14.2 plt 270   RSV positive  Influenza negative Sars COVID 19 negative   Chest radiograph with hyperinflation, positive cardiomegaly, bilateral hilar vascular congestion, bilateral central interstitial infiltrates predominant at lower zones, positive cephalization of the vasculature.   EKG 93 bpm, left axis deviation, left anterior fascicular block, qtc 471, sinus rhythm with no significant ST segment or T wave changes.    Assessment and Plan: * Acute on chronic diastolic CHF (congestive heart failure) (HCC) 2024 echocardiogram with preserved LV systolic function, with RV size mild enlargement with preserved systolic function,   Patient continue  volume overloaded.   Plan to continue diuresis with furosemide  60 mg IV bid Add losartan  and spironolactone  for RAAS inhibition   Essential hypertension Add losartan  for blood pressure control Continue blood pressure monitoring   Asthma, chronic, unspecified asthma severity, with acute exacerbation Acute hypoxemic respiratory failure.  Positive for RSV.   Plan to continue bronchodilator therapy with duoneb.  LABA and ICS.  Airway clearing techniques and flutter valve.  Systemic corticosteroids  Antibiotic therapy with azithromycin  for airway inflammation  Out of bed to chair tid with meals Antitussive agents.   Hypokalemia Stable renal function with serum cr at 1,0 with K at 3,2 and serum bicarbonate at 28  Na 139   Plan to add 40 meq KCl x2 and follow up renal function and electrolytes in am Continue diuresis with furosemide  and add spironolactone    Prediabetes Positive hyperglycemia, steroid induced.   check Hgb A1c. Continue insulin  sliding scale for glucose cover and monitoring   Depression Continue sertraline , trazodone  and bupropion   Obesity, class 3 (HCC) Calculated BMI is 53,0        Subjective: Patient with no chest pain, dyspnea has improved but not back to baseline, continue to have wheezing and lower extremity edema.   Physical Exam: Vitals:   03/09/24 2131 03/10/24 0033 03/10/24 0554 03/10/24 0836  BP: (!) 153/82 (!) 113/58 120/76 (!) 144/92  Pulse: 91 84 82 92  Resp: (!) 22 20 16 19   Temp: 98.4 F (36.9 C) 98 F (36.7 C) 97.8 F (36.6 C) 98.3 F (36.8 C)  TempSrc: Oral Oral Oral Oral  SpO2: 97% 98% 96% 96%  Weight: (!) 187.3 kg     Height: 6' 2 (1.88 m)      Neurology awake and alert, deconditioned ENT with mild pallor Cardiovascular with S1 and S2 present and regular with no gallops, rubs or murmurs Respiratory with increased work of breathing at rest, with accessory muscle use, positive expiratory wheezing bilaterally, with scattered  rhonchi and rales Abdomen with no distention, soft and non tender, positive protuberant Positive lower extremity edema +++ (probably component of lymphedema)   Data Reviewed:    Family Communication: no family at the bedside   Disposition: Status is: Inpatient Remains inpatient appropriate because: IV diuresis and respiratory support   Planned Discharge Destination: Home    Author: Elidia Toribio Furnace, MD 03/10/2024 11:07 AM  For on call review [email or web address].  "

## 2024-03-10 NOTE — Hospital Course (Addendum)
 Mr. Malcomb was admitted to the hospital with the working diagnosis of heart failure exacerbation in the setting of RSV viral infection.   49 yo male with the past medical history of heart failure, OSA, obesity and depression who presented with dyspnea. Reported worsening dyspnea for the last 3 days, apparently he has been off medications, including diuretics and inhalers, because housing issues. On the day of admission he had severe symptoms, EMS was called and he was found in respiratory distress with low 02 saturation. He was placed on 4 L/min per [place] of supplemental 02 and was transported to the ED.  On his initial physical examination his blood pressure was 153/82, HR 103, RR 22 and 02 saturation 91%  Lungs with bilateral rales with diffuse rhonchi, heart with S1 and S2 present and regular with no gallops or rubs, abdomen with no distention, soft and non tender, positive lower extremity edema.   VBG 7,53/ 38.8/ 98/ 34/ 98%  Na 140, K 3.3 Cl 101, bicarbonate 28 glucose 176 bun 13 cr 0,97  BNP 872  High sensitive troponin 20 and 21  Lactic acid 1,7 and 1,8  Wbc 9,5 hgb 14.2 plt 270   RSV positive  Influenza negative Sars COVID 19 negative   CT chest with bilateral ground glass opacities,   Chest radiograph with hyperinflation, positive cardiomegaly, bilateral hilar vascular congestion, bilateral central interstitial infiltrates predominant at lower zones, positive cephalization of the vasculature.   EKG 93 bpm, left axis deviation, left anterior fascicular block, qtc 471, sinus rhythm with no significant ST segment or T wave changes.   Patient placed on IV furosemide , IV steroids and bronchodilators.   01/12 clinically improving but not yet back to baseline

## 2024-03-10 NOTE — Assessment & Plan Note (Signed)
 Stable renal function with serum cr at 1,0 with K at 3,2 and serum bicarbonate at 28  Na 139   Plan to add 40 meq KCl x2 and follow up renal function and electrolytes in am Continue diuresis with furosemide  and add spironolactone 

## 2024-03-10 NOTE — Evaluation (Signed)
 Occupational Therapy Evaluation Patient Details Name: Christian [name] MRN: [id number] DOB: Apr 01, 1975 Today's Date: 03/10/2024   History of Present Illness   49 y.o. male presents to [facility] 03/09/24 with RSV and HFrEF exacerbation. PMHx:  HFpEF (EF 60-65% in 05/2022), OSA, depression/anxiety, and morbid obesity     Clinical Impressions Pt admitted based on above, and was seen based on problem list below. PTA pt was independent with ADLs. Today pt is requiring set up  to min  assist for ADLs. Functional transfers are  CGA with use of rollator. Pt limited by decreased activity tolerance, requiring up to 6L O2 for activity. Anticipate will progress well with continued mobilization, no follow up OT needs.  OT will continue to follow acutely to maximize functional independence.     If plan is discharge home, recommend the following:   A little help with walking and/or transfers;A little help with bathing/dressing/bathroom;Assistance with cooking/housework     Functional Status Assessment   Patient has had a recent decline in their functional status and demonstrates the ability to make significant improvements in function in a reasonable and predictable amount of time.     Equipment Recommendations   None recommended by OT      Precautions/Restrictions   Precautions Precautions: Fall Recall of Precautions/Restrictions: Intact Precaution/Restrictions Comments: watch O2 Restrictions Weight Bearing Restrictions Per Provider Order: No     Mobility Bed Mobility Overal bed mobility: Needs Assistance Bed Mobility: Supine to Sit     Supine to sit: Supervision, HOB elevated     General bed mobility comments: used momentum with supervision for safety    Transfers Overall transfer level: Needs assistance Equipment used: Rollator (4 wheels) Transfers: Sit to/from Stand Sit to Stand: Contact guard assist           General transfer comment: CGA with use of momentum to  power-up      Balance Overall balance assessment: Needs assistance Sitting-balance support: Feet supported, No upper extremity supported Sitting balance-[name]: Fair     Standing balance support: Bilateral upper extremity supported, During functional activity, Reliant on assistive device for balance Standing balance-[name]: Poor Standing balance comment: reliant on UE support       ADL either performed or assessed with clinical judgement   ADL Overall ADL's : Needs assistance/impaired Eating/Feeding: Set up;Sitting   Grooming: Set up;Sitting           Upper Body Dressing : Set up;Sitting   Lower Body Dressing: Sit to/from stand;Contact guard assist Lower Body Dressing Details (indicate cue type and reason): Increased time and effort Toilet Transfer: Contact guard assist;Rolling walker (2 wheels);Ambulation           Functional mobility during ADLs: Contact guard assist;Rolling walker (2 wheels) General ADL Comments: Limited by decreased activity tolerance     Vision Baseline Vision/History: 1 Wears glasses Vision Assessment?: No apparent visual deficits            Pertinent Vitals/Pain Pain Assessment Pain Assessment: Faces Faces Pain Scale: Hurts little more Pain Location: B knees with gait Pain Descriptors / Indicators: Aching, Discomfort Pain Intervention(s): Limited activity within patient's tolerance     Extremity/Trunk Assessment Upper Extremity Assessment Upper Extremity Assessment: Generalized weakness   Lower Extremity Assessment Lower Extremity Assessment: Defer to PT evaluation   Cervical / Trunk Assessment Cervical / Trunk Assessment: Other exceptions Cervical / Trunk Exceptions: increased body habitus   Communication Communication Communication: No apparent difficulties   Cognition Arousal: Alert Behavior During Therapy: [facility]  for tasks assessed/performed Cognition: No apparent impairments   Following commands: Intact        Cueing  General Comments   Cueing Techniques: Verbal cues;Tactile cues  90% SpO2 on 4L at rest  86-92% SpO2 on 6L during mobility left resting on 5L at 90%           Home Living Family/patient expects to be discharged to:: Shelter/Homeless     Additional Comments: Has lost placement at prior homeless shelter. In process of trying to secure new placement. Reports at baseline uses CPAP at night only      Prior Functioning/Environment Prior Level of Function : Independent/Modified Independent             Mobility Comments: ModI with rollator for short distances ADLs Comments: mod I , but reports difficulty with posterior pericare    OT Problem List: Decreased strength;Impaired balance (sitting and/or standing);Decreased activity tolerance;Cardiopulmonary status limiting activity   OT Treatment/Interventions: Self-care/ADL training;Therapeutic exercise;Energy conservation;DME and/or AE instruction;Therapeutic activities;Patient/family education;Balance training      OT Goals(Current goals can be found in the care plan section)   Acute Rehab OT Goals Patient Stated Goal: To get housing OT Goal Formulation: With patient Time For Goal Achievement: 03/24/24 Potential to Achieve Goals: Good   OT Frequency:  Min 2X/week       AM-PAC OT 6 Clicks Daily Activity     Outcome Measure Help from another person eating meals?: None Help from another person taking care of personal grooming?: A Little Help from another person toileting, which includes using toliet, bedpan, or urinal?: A Lot Help from another person bathing (including washing, rinsing, drying)?: A Little Help from another person to put on and taking off regular upper body clothing?: A Little Help from another person to put on and taking off regular lower body clothing?: A Little 6 Click Score: 18   End of Session Equipment Utilized During Treatment: Rollator (4 wheels) Nurse Communication: Mobility  status  Activity Tolerance: Patient tolerated treatment well Patient left: in chair;with call bell/phone within reach  OT Visit Diagnosis: Unsteadiness on feet (R26.81);Other abnormalities of gait and mobility (R26.89);Muscle weakness (generalized) (M62.81)                Time: 8574-8544 OT Time Calculation (min): 30 min Charges:  OT General Charges $OT Visit: 1 Visit OT Evaluation $OT Eval Moderate Complexity: 1 Mod  Adrianne BROCKS, OT  Acute Rehabilitation Services Office [phone] Secure chat preferred\  Adrianne GORMAN Savers 03/10/2024, 5:00 PM

## 2024-03-10 NOTE — Assessment & Plan Note (Signed)
Continue sertraline, trazodone and bupropion

## 2024-03-10 NOTE — Assessment & Plan Note (Signed)
 Continue with losartan  for blood pressure control Continue blood pressure monitoring

## 2024-03-10 NOTE — Assessment & Plan Note (Addendum)
 2024 echocardiogram with preserved LV systolic function, with RV size mild enlargement with preserved systolic function,   Patient continue volume overloaded.  Urine output 1,450 ml  Systolic blood pressure 140 mmHg   Plan to continue diuresis with furosemide  60 mg IV bid Continue with losartan  and spironolactone  for RAAS inhibition

## 2024-03-11 ENCOUNTER — Inpatient Hospital Stay (HOSPITAL_COMMUNITY): Payer: MEDICAID

## 2024-03-11 DIAGNOSIS — R0609 Other forms of dyspnea: Secondary | ICD-10-CM

## 2024-03-11 DIAGNOSIS — I10 Essential (primary) hypertension: Secondary | ICD-10-CM | POA: Diagnosis not present

## 2024-03-11 DIAGNOSIS — I5033 Acute on chronic diastolic (congestive) heart failure: Secondary | ICD-10-CM | POA: Diagnosis not present

## 2024-03-11 DIAGNOSIS — E876 Hypokalemia: Secondary | ICD-10-CM | POA: Diagnosis not present

## 2024-03-11 DIAGNOSIS — J45901 Unspecified asthma with (acute) exacerbation: Secondary | ICD-10-CM | POA: Diagnosis not present

## 2024-03-11 LAB — ECHOCARDIOGRAM COMPLETE
AR max vel: 2.99 cm2
AV Area VTI: 3.17 cm2
AV Area mean vel: 2.88 cm2
AV Mean grad: 6 mmHg
AV Peak grad: 10.6 mmHg
Ao pk vel: 1.63 m/s
Area-P 1/2: 2.54 cm2
Calc EF: 62.6 %
Height: 74 in
MV VTI: 2.43 cm2
S' Lateral: 3 cm
Single Plane A2C EF: 62.1 %
Single Plane A4C EF: 64.8 %
Weight: 6606.4 [oz_av]

## 2024-03-11 LAB — CBC
HCT: 38.6 % — ABNORMAL LOW (ref 39.0–52.0)
Hemoglobin: 12.9 g/dL — ABNORMAL LOW (ref 13.0–17.0)
MCH: 27.5 pg (ref 26.0–34.0)
MCHC: 33.4 g/dL (ref 30.0–36.0)
MCV: 82.3 fL (ref 80.0–100.0)
Platelets: 297 K/uL (ref 150–400)
RBC: 4.69 MIL/uL (ref 4.22–5.81)
RDW: 13.4 % (ref 11.5–15.5)
WBC: 10.5 K/uL (ref 4.0–10.5)
nRBC: 0 % (ref 0.0–0.2)

## 2024-03-11 LAB — BASIC METABOLIC PANEL WITH GFR
Anion gap: 9 (ref 5–15)
BUN: 19 mg/dL (ref 6–20)
CO2: 32 mmol/L (ref 22–32)
Calcium: 8.5 mg/dL — ABNORMAL LOW (ref 8.9–10.3)
Chloride: 100 mmol/L (ref 98–111)
Creatinine, Ser: 0.95 mg/dL (ref 0.61–1.24)
GFR, Estimated: >60 mL/min
Glucose, Bld: 156 mg/dL — ABNORMAL HIGH (ref 70–99)
Potassium: 2.9 mmol/L — ABNORMAL LOW (ref 3.5–5.1)
Sodium: 141 mmol/L (ref 135–145)

## 2024-03-11 LAB — GLUCOSE, CAPILLARY
Glucose-Capillary: 99 mg/dL (ref 70–99)
Glucose-Capillary: 212 mg/dL — ABNORMAL HIGH (ref 70–99)
Glucose-Capillary: 227 mg/dL — ABNORMAL HIGH (ref 70–99)
Glucose-Capillary: 122 mg/dL — ABNORMAL HIGH (ref 70–99)

## 2024-03-11 LAB — MAGNESIUM: Magnesium: 2.3 mg/dL (ref 1.7–2.4)

## 2024-03-11 MED ORDER — POTASSIUM CHLORIDE CRYS ER 20 MEQ PO TBCR
40.0000 meq | EXTENDED_RELEASE_TABLET | ORAL | Status: AC
  Administered 2024-03-11 (×3): 40 meq via ORAL
  Filled 2024-03-11 (×3): qty 2

## 2024-03-11 MED ORDER — ALBUTEROL SULFATE (2.5 MG/3ML) 0.083% IN NEBU: 2.5000 mg | INHALATION_SOLUTION | Freq: Four times a day (QID) | RESPIRATORY_TRACT | Status: DC | PRN

## 2024-03-11 NOTE — TOC Initial Note (Signed)
 Transition of Care [facility]) - Initial/Assessment Note    Patient Details  Name: Christian [name] MRN: [id number] Date of Birth: March 16, 1975  [facility] CM/SW Contact:    [name], LCSWA Phone Number: 03/11/2024, 3:56 PM  Clinical Narrative:                  CSW spoke with patient. Patient reports he currently is Homeless. PTA patient reports he was at [facility] but has since been discharged and unable to return for a year. Patient reports his belongings are there and is working on getting someone to pick up his belongings at the shelter. Patient reports he has been homeless for about 2 1/2 years. Patient reports he does receive SSI and recently started receiving SSDI. Patient reports he has a care coordinator [name] tele# [phone] that plans on coming to see him on Wednesday at the hospital. Patient reports he currently is in a transitional housing program through his insurance. Patient gave CSW permission to follow up on status of the program. Patient also gave CSW permission to reach out to [employer] to see if they have an open shelter bed available closer to patient being medically ready for dc. Patient reports that if he is unable to obtain housing or get a shelter bed when ready for dc then he will plan on going to a hotel/motel. CSW offered patient shelter/housing resources,food resources,transportation resources,and psychiatry and counseling resources. Patient accepted all resources except psychiatry and counseling/patient reports he already sees someone . All questions answered. No further questions reported at this time. CSW plans to follow up with patients care coordinator on status of housing program. CSW will continue to follow.  Expected Discharge Plan:  (TBD) Barriers to Discharge: Continued Medical Work up   Patient Goals and CMS Choice Patient states their goals for this hospitalization and ongoing recovery are:: to go to shelter if  unable to go to shelter or obtain housing through his insurance then dc to hotel/motel          Expected Discharge Plan and Services In-house Referral: Clinical Social Work     Living arrangements for the past 2 months: Homeless                                      Prior Living Arrangements/Services Living arrangements for the past 2 months: Homeless Lives with:: Self Patient language and need for interpreter reviewed:: Yes              Criminal Activity/Legal Involvement Pertinent to Current Situation/Hospitalization: No - Comment as needed  Activities of Daily Living   ADL Screening (condition at time of admission) Independently performs ADLs?: Yes (appropriate for developmental age) Is the patient deaf or have difficulty hearing?: No Does the patient have difficulty seeing, even when wearing glasses/contacts?: No Does the patient have difficulty concentrating, remembering, or making decisions?: No  Permission Sought/Granted Permission sought to share information with : Case Manager, Other (comment) ([facility])                Emotional Assessment   Attitude/Demeanor/Rapport: Gracious Affect (typically observed): Calm Orientation: : Oriented to Self, Oriented to Place, Oriented to  Time, Oriented to Situation, Fluctuating Orientation (Suspected and/or reported Sundowners) Alcohol / Substance Use: Not Applicable Psych Involvement: No (comment)  Admission diagnosis:  Shortness of breath [R06.02] Moderate  persistent asthma with exacerbation [J45.41] Acute on chronic heart failure (HCC) [I50.9] Patient Active Problem List   Diagnosis Date Noted   Asthma, chronic, unspecified asthma severity, with acute exacerbation 03/10/2024   Acute on chronic diastolic CHF (congestive heart failure) (HCC) 03/09/2024   Aspiration pneumonia (HCC) 11/19/2022   Mild protein malnutrition 11/19/2022   Asthma 08/31/2022   CAP (community  acquired pneumonia) 06/14/2022   Lung nodules 05/05/2022   GAD (generalized anxiety disorder) 03/30/2022   Inadequate community resources 03/29/2022   MDD (major depressive disorder), recurrent episode, moderate (HCC) 03/29/2022   Housing instability, currently housed, at risk for homelessness 03/29/2022   Dyspnea 03/18/2022   OSA (obstructive sleep apnea) 03/17/2022   COVID 01/07/2022   Nocturnal hypoxia    Sepsis without acute organ dysfunction (HCC)    Influenza A 01/15/2021   Homelessness 01/15/2021   Hypokalemia 01/15/2021   Cellulitis and abscess of buttock 01/08/2021   Prediabetes 01/08/2021   Pleural effusion 11/25/2020   Chronic diastolic CHF (congestive heart failure) (HCC) 11/25/2020   Depression 11/25/2020   Obesity hypoventilation syndrome (HCC) 10/17/2020   Acute on chronic respiratory failure with hypoxia and hypercapnia (HCC)    Bowel perforation (HCC)    Hypotension    Acute respiratory failure with hypoxemia (HCC)    Weakness    Cellulitis of abdominal wall 10/10/2020   Impaired fasting glucose    Hyperglycemia 10/09/2020   AKI (acute kidney injury) 10/09/2020   Essential hypertension 10/09/2020   Chest pain 04/06/2011   Obesity, class 3 (HCC)    Elevated blood pressure reading without diagnosis of hypertension    PCP:  Tanda Bleacher, MD Pharmacy:   [facility] [id number] ([address]), [place] - [address] [place] ([place]) [place] [address] Phone: [phone] Fax: [phone]  [place] [facility] [address] [place] [address] Phone: [phone] Fax: [phone]  [facility] [address] [place] [address] Phone: [phone] Fax: [phone]  [facility] [address] [place], [place] - [address] [place] [address] Phone: [phone] Fax: [phone]     Social Drivers of Health  (SDOH) Social History: SDOH Screenings   Food Insecurity: Food Insecurity Present (03/09/2024)  Housing: High Risk (03/09/2024)  Transportation Needs: Unmet Transportation Needs (03/09/2024)  Utilities: At Risk (03/09/2024)  Alcohol Screen: Low Risk (12/09/2023)  Depression (PHQ2-9): Medium Risk (01/19/2024)  Financial Resource Strain: High Risk (12/09/2023)  Physical Activity: Insufficiently Active (12/09/2023)  Social Connections: Moderately Integrated (12/09/2023)  Stress: Stress Concern Present (12/09/2023)  Tobacco Use: Medium Risk (03/10/2024)  Health Literacy: Adequate Health Literacy (11/17/2022)   SDOH Interventions:     Readmission Risk Interventions    11/22/2022   11:09 AM 06/16/2022    1:32 PM  Readmission Risk Prevention Plan  Transportation Screening Complete Complete  PCP or Specialist Appt within 5-7 Days Complete Complete  Home Care Screening Complete Complete  Medication Review (RN CM) Complete Complete

## 2024-03-11 NOTE — Progress Notes (Signed)
 " Progress Note   Patient: Christian [name] FMW:[id number] DOB: 01/01/1976 DOA: 03/09/2024     2 DOS: the patient was seen and examined on 03/11/2024   Brief hospital course: Christian [name] was admitted to the hospital with the working diagnosis of heart failure exacerbation in the setting of RSV viral infection.   49 yo male with the past medical history of heart failure, OSA, obesity and depression who presented with dyspnea. Reported worsening dyspnea for the last 3 days, apparently he has been off medications, including diuretics and inhalers, because housing issues. On the day of admission he had severe symptoms, EMS was called and he was found in respiratory distress with low 02 saturation. He was placed on 4 L/min per [place] of supplemental 02 and was transported to the ED.  On his initial physical examination his blood pressure was 153/82, HR 103, RR 22 and 02 saturation 91%  Lungs with bilateral rales with diffuse rhonchi, heart with S1 and S2 present and regular with no gallops or rubs, abdomen with no distention, soft and non tender, positive lower extremity edema.   VBG 7,53/ 38.8/ 98/ 34/ 98%  Na 140, K 3.3 Cl 101, bicarbonate 28 glucose 176 bun 13 cr 0,97  BNP 872  High sensitive troponin 20 and 21  Lactic acid 1,7 and 1,8  Wbc 9,5 hgb 14.2 plt 270   RSV positive  Influenza negative Sars COVID 19 negative   CT chest with bilateral ground glass opacities,   Chest radiograph with hyperinflation, positive cardiomegaly, bilateral hilar vascular congestion, bilateral central interstitial infiltrates predominant at lower zones, positive cephalization of the vasculature.   EKG 93 bpm, left axis deviation, left anterior fascicular block, qtc 471, sinus rhythm with no significant ST segment or T wave changes.   Patient placed on IV furosemide , IV steroids and bronchodilators.   01/12 clinically improving but not yet back to baseline   Assessment and Plan: * Acute on chronic diastolic CHF  (congestive heart failure) (HCC) 2024 echocardiogram with preserved LV systolic function, with RV size mild enlargement with preserved systolic function,   Patient continue volume overloaded.  Urine output 1,450 ml  Systolic blood pressure 140 mmHg   Plan to continue diuresis with furosemide  60 mg IV bid Continue with losartan  and spironolactone  for RAAS inhibition   Essential hypertension Continue with losartan  for blood pressure control Continue blood pressure monitoring   Asthma, chronic, unspecified asthma severity, with acute exacerbation Acute hypoxemic respiratory failure.  Positive for RSV.   Plan to continue bronchodilator therapy with duoneb.  LABA and ICS.  Airway clearing techniques and flutter valve.  Systemic corticosteroids  Antibiotic therapy with azithromycin  for airway inflammation  Out of bed to chair tid with meals Antitussive agents.   Hypokalemia Renal function with serum cr at 0,95 with K at 2,9 and serum bicarbonate at 32 Na 141 and Mg 2,3   Plan to add 40 meq KCl x3 and follow up renal function and electrolytes in am Continue diuresis with furosemide  and spironolactone    Prediabetes Positive hyperglycemia, steroid induced.   Hgb A1c. 6.0  Continue insulin  sliding scale for glucose cover and monitoring   Depression Continue sertraline , trazodone  and bupropion   Obesity, class 3 (HCC) Calculated BMI is 53,0     Subjective: patient with no chest pain, dyspnea has improved but not at baseline, continue to have wheezing, continue lower extremity edema, today is out of bed to the chair   Physical Exam: Vitals:   03/10/24  2024 03/10/24 2315 03/11/24 0356 03/11/24 0700  BP: 134/80 (!) 153/71 (!) 142/95 136/82  Pulse: 84 80 79 67  Resp: 16 20 (!) 22 20  Temp: 97.6 F (36.4 C) 98.2 F (36.8 C) 97.8 F (36.6 C) 97.7 F (36.5 C)  TempSrc: Oral Oral Oral Oral  SpO2: 95% 98% 96% 95%  Weight:      Height:       Neurology awake and alert ENT  with mild pallor with no icterus Cardiovascular with S1 and S2 present and regular with no gallops or rubs No JVD Respiratory with positive inspiratory and expiratory wheezing with no rhonchi, positive rales bilaterally at bases Abdomen protuberant, soft and not tender, not distended Positive lower extremity edema ++   Data Reviewed:    Family Communication: no family at the bedside   Disposition: Status is: Inpatient Remains inpatient appropriate because: IV diuresis  Planned Discharge Destination: Home     Author: Elidia Toribio Furnace, MD 03/11/2024 10:03 AM  For on call review [email or web address].  "

## 2024-03-11 NOTE — Plan of Care (Signed)

## 2024-03-12 ENCOUNTER — Ambulatory Visit: Payer: MEDICAID | Admitting: Family Medicine

## 2024-03-12 DIAGNOSIS — J4541 Moderate persistent asthma with (acute) exacerbation: Secondary | ICD-10-CM

## 2024-03-12 LAB — BASIC METABOLIC PANEL WITH GFR
Anion gap: 9 (ref 5–15)
BUN: 20 mg/dL (ref 6–20)
CO2: 32 mmol/L (ref 22–32)
Calcium: 8.7 mg/dL — ABNORMAL LOW (ref 8.9–10.3)
Chloride: 100 mmol/L (ref 98–111)
Creatinine, Ser: 1 mg/dL (ref 0.61–1.24)
GFR, Estimated: >60 mL/min
Glucose, Bld: 140 mg/dL — ABNORMAL HIGH (ref 70–99)
Potassium: 3.4 mmol/L — ABNORMAL LOW (ref 3.5–5.1)
Sodium: 141 mmol/L (ref 135–145)

## 2024-03-12 LAB — MAGNESIUM: Magnesium: 2.2 mg/dL (ref 1.7–2.4)

## 2024-03-12 LAB — GLUCOSE, CAPILLARY
Glucose-Capillary: 107 mg/dL — ABNORMAL HIGH (ref 70–99)
Glucose-Capillary: 148 mg/dL — ABNORMAL HIGH (ref 70–99)
Glucose-Capillary: 157 mg/dL — ABNORMAL HIGH (ref 70–99)
Glucose-Capillary: 145 mg/dL — ABNORMAL HIGH (ref 70–99)

## 2024-03-12 MED ORDER — LOSARTAN POTASSIUM 25 MG PO TABS
25.0000 mg | ORAL_TABLET | Freq: Every day | ORAL | Status: DC
  Administered 2024-03-12 – 2024-03-14 (×3): 25 mg via ORAL
  Filled 2024-03-12 (×3): qty 1

## 2024-03-12 MED ORDER — POTASSIUM CHLORIDE CRYS ER 20 MEQ PO TBCR
40.0000 meq | EXTENDED_RELEASE_TABLET | ORAL | Status: AC
  Administered 2024-03-12 (×2): 40 meq via ORAL
  Filled 2024-03-12 (×2): qty 2

## 2024-03-12 MED ORDER — AZITHROMYCIN 250 MG PO TABS
500.0000 mg | ORAL_TABLET | Freq: Every day | ORAL | Status: AC
  Administered 2024-03-13 – 2024-03-14 (×2): 500 mg via ORAL
  Filled 2024-03-12 (×2): qty 2

## 2024-03-12 MED ORDER — SPIRONOLACTONE 12.5 MG HALF TABLET
12.5000 mg | ORAL_TABLET | Freq: Every day | ORAL | Status: DC
  Administered 2024-03-12 – 2024-03-14 (×3): 12.5 mg via ORAL
  Filled 2024-03-12 (×3): qty 1

## 2024-03-12 MED ORDER — TORSEMIDE 20 MG PO TABS
60.0000 mg | ORAL_TABLET | Freq: Every day | ORAL | Status: DC
  Administered 2024-03-13 – 2024-03-14 (×2): 60 mg via ORAL
  Filled 2024-03-12 (×2): qty 3

## 2024-03-12 NOTE — Progress Notes (Signed)
 " PROGRESS NOTE    Christian [name]  FMW:[id number] DOB: 13-Jul-1975 DOA: 03/09/2024 PCP: Tanda Bleacher, MD (Confirm with patient/family/NH records and if not entered, this HAS to be entered at [facility] point of entry. No PCP if truly none.)   Chief Complaint  Patient presents with   Shortness of Breath    Brief Narrative: (Start on day 1 of progress note - keep it brief and live) Pt is a 49 y/o male with a working diagnosis of heart failure exacerbation in the setting of RSV and missed medications causing shortness of breath and fluid overload.  Pt has a history of Heart failure, OSA, obesity, depression. Reported worsening dyspnea and flu-like symptoms for three days after staying at his friends house, and due to some housing problems, left his medications in the shelter housing, casing him to miss 3-4 lasix  doses.  EMS was called, and he was found in moderate-severe respiratory distress, and was placed on high flow nasal O2 at 4L.   Upon presentation in ED, he was satting at 91%, hypertensive at 153/82, HR 103, and RR22.  Lungs had bilateral rales and rhonchi, with positive lower extremity edema.   RSV positive, Flu negative and Covid negative. CT chest showed bilateral ground glass opacities Xray showed cardiomegaly, congestion, and bilateral infiltrates.   EKG 93 bpm, left axis deviation, left anterior fascicular block, qtc 471, sinus rhythm with no significant ST segment or T wave changes.    Patient placed on IV furosemide , IV steroids and bronchodilators, with some improvement.   Assessment & Plan:   Acute on chronic diastolic CHF (congestive heart failure) (HCC)   2024 Echo shows preserved LV systolic function RV size enlargement  1/12- repeat echo shows no significant change from prior study   Patient mildly volume overloaded,  Urine output 4.5 liters overnight, SBP 130 overnight D/c furosemide , and add back home torsemide  60mg  once daily.  Continue with spironolactone  and  losartan     Asthma, chronic, unspecified asthma severity, with acute exacerbation   Acute hypoxemic respiratory failure, likely in part to positive RSV.  Currently getting better on LABA and ICS, continue  Azithromycin  given for inflammation.  Continue methylprednisolone  injection Antitussive medications      Essential hypertension Monitor BP, stable at ~122/80 Continue Losartan  and monitor w/diuresis    Depression Continue on sertraline     Prediabetes Continue monitoring with SSI A1C at 6.0     Hypokalemia Lab in am 3.4 Given Kcl yesterday with improvement from 2.9 Monitor w/diuretics, repeat electrolytes in am Give 80 mEq of KCl       Obesity, class 3 (HCC) Calculated at 53.0  DVT prophylaxis: Enoxaparin  Code Status: Full Family Communication: None at bedside Disposition:   Status is: Inpatient Remains inpatient appropriate because: fluid overload and RSV exacerbating asthma symptoms     Subjective: Pt is found AxOx4 seated in chair after having just completed some PT. Pt stated they tried to do their walk without oxygen, but even with the oxygen, his spO2 was decreasing very quickly, so he had to stay on oxygen. Pt is partially out of breath upon arrival, but well-mannered and coughing intermittently. Pt States at this time his only complaint is his cough, chest congestion, and DOE. Denies headache, n/v/d, dizziness, abdominal pain, chest pain or other changes. Notes he has had two normal bowel movements in past 12 hours.   Objective: Vitals:   03/12/24 0435 03/12/24 0826 03/12/24 0853 03/12/24 1156  BP: 131/81 130/75  114/87  Pulse:  66 73  86  Resp: 19 17  17   Temp: (!) 97.4 F (36.3 C) 98.2 F (36.8 C)  98.3 F (36.8 C)  TempSrc: Axillary Oral  Oral  SpO2: 93% 96% 94% 90%  Weight:      Height:        Intake/Output Summary (Last 24 hours) at 03/12/2024 1301 Last data filed at 03/12/2024 1200 Gross per 24 hour  Intake 120 ml  Output 2850 ml  Net  -2730 ml   Filed Weights   03/09/24 2131  Weight: (!) 187.3 kg    Examination:  General exam: Appears calm and comfortable  Respiratory system: Rhonchi heard bilaterally Respiratory effort mildly increased, but better than yesterday. Cardiovascular system:  S1 & S2 heard, RRR. No JVD, murmurs, rubs, gallops or clicks. Moderate pedal edema. Gastrointestinal system: Abdomen is nondistended, soft and nontender.  Central nervous system: Alert and oriented. No focal neurological deficits. Extremities: Symmetric 5 x 5 power, mild-moderate edema in lower extremities. Skin: No rashes, lesions or ulcers   Data Reviewed: I have personally reviewed following labs and imaging studies  CBC: Recent Labs  Lab 03/09/24 1455 03/09/24 1508 03/09/24 1741 03/10/24 0021 03/11/24 0434  WBC 9.5  --  9.8 8.4 10.5  NEUTROABS 6.9  --   --   --   --   HGB 14.2 13.9 14.3 14.6 12.9*  HCT 41.3 41.0 42.9 42.7 38.6*  MCV 81.5  --  83.6 81.5 82.3  PLT 270  --  271 290 297    Basic Metabolic Panel: Recent Labs  Lab 03/09/24 1455 03/09/24 1508 03/09/24 1741 03/10/24 0021 03/11/24 0434 03/12/24 0948  NA 140 138  --  139 141 141  K 3.3* 3.9  --  3.2* 2.9* 3.4*  CL 101  --   --  100 100 100  CO2 28  --   --  28 32 32  GLUCOSE 176*  --   --  223* 156* 140*  BUN 13  --   --  15 19 20   CREATININE 0.97  --  0.94 1.08 0.95 1.00  CALCIUM  8.5*  --   --  8.8* 8.5* 8.7*  MG  --   --   --   --  2.3 2.2    GFR: Estimated Creatinine Clearance: 155.3 mL/min (by C-G formula based on SCr of 1 mg/dL).  Liver Function Tests: Recent Labs  Lab 03/09/24 1455  AST 29  ALT 19  ALKPHOS 88  BILITOT 0.8  PROT 6.8  ALBUMIN 3.6    CBG: Recent Labs  Lab 03/11/24 1213 03/11/24 1611 03/11/24 2120 03/12/24 0824 03/12/24 1155  GLUCAP 212* 227* 122* 107* 148*     Recent Results (from the past 240 hours)  Resp panel by RT-PCR (RSV, Flu A&B, Covid) Peripheral     Status: Abnormal   Collection Time:  03/09/24  2:30 PM   Specimen: Peripheral; Nasal Swab  Result Value Ref Range Status   SARS Coronavirus 2 by RT PCR NEGATIVE NEGATIVE Final   Influenza A by PCR NEGATIVE NEGATIVE Final   Influenza B by PCR NEGATIVE NEGATIVE Final    Comment: (NOTE) The Xpert Xpress SARS-CoV-2/FLU/RSV plus assay is intended as an aid in the diagnosis of influenza from Nasopharyngeal swab specimens and should not be used as a sole basis for treatment. Nasal washings and aspirates are unacceptable for Xpert Xpress SARS-CoV-2/FLU/RSV testing.  Fact Sheet for Patients: bloggercourse.com  Fact Sheet for Healthcare Providers: seriousbroker.it  This test  is not yet approved or cleared by the [place]  FDA and has been authorized for detection and/or diagnosis of SARS-CoV-2 by FDA under an Emergency Use Authorization (EUA). This EUA will remain in effect (meaning this test can be used) for the duration of the COVID-19 declaration under Section 564(b)(1) of the Act, 21 U.S.C. section 360bbb-3(b)(1), unless the authorization is terminated or revoked.     Resp Syncytial Virus by PCR POSITIVE (A) NEGATIVE Final    Comment: (NOTE) Fact Sheet for Patients: bloggercourse.com  Fact Sheet for Healthcare Providers: seriousbroker.it  This test is not yet approved or cleared by the [place]  FDA and has been authorized for detection and/or diagnosis of SARS-CoV-2 by FDA under an Emergency Use Authorization (EUA). This EUA will remain in effect (meaning this test can be used) for the duration of the COVID-19 declaration under Section 564(b)(1) of the Act, 21 U.S.C. section 360bbb-3(b)(1), unless the authorization is terminated or revoked.  Performed at [facility], [address], [place], [place] [address]   Culture, blood (routine x 2)     Status: None (Preliminary result)   Collection Time:  03/09/24  2:55 PM   Specimen: BLOOD RIGHT HAND  Result Value Ref Range Status   Specimen Description BLOOD RIGHT HAND  Final   Special Requests   Final    BOTTLES DRAWN AEROBIC AND ANAEROBIC Blood Culture adequate volume   Culture   Final    NO GROWTH 3 DAYS Performed at [facility], [address] [place], [place] [address]    Report Status PENDING  Incomplete  Culture, blood (routine x 2)     Status: None (Preliminary result)   Collection Time: 03/10/24 12:21 AM   Specimen: BLOOD LEFT HAND  Result Value Ref Range Status   Specimen Description BLOOD LEFT HAND  Final   Special Requests   Final    BOTTLES DRAWN AEROBIC AND ANAEROBIC Blood Culture adequate volume   Culture   Final    NO GROWTH 2 DAYS Performed at [facility], [address] [place], [place] [address]    Report Status PENDING  Incomplete         Radiology Studies: ECHOCARDIOGRAM COMPLETE Result Date: 03/11/2024    ECHOCARDIOGRAM REPORT   Patient Name:   BREES HOUNSHELL Mastrogiovanni Date of Exam: 03/11/2024 Medical Rec #:  [id number]         Height:       74.0 in Accession #:    [id number]        Weight:       412.9 lb Date of Birth:  1975-04-20         BSA:          2.958 m Patient Age:    49 years          BP:           136/82 mmHg Patient Gender: M                 HR:           85 bpm. Exam Location:  Inpatient Procedure: 2D Echo, Cardiac Doppler and Color Doppler (Both Spectral and Color            Flow Doppler were utilized during procedure). Indications:    Dyspnea R06.00  History:        Patient has prior history of Echocardiogram examinations, most  recent 06/22/2022. CHF.  Sonographer:    Sydnee Wilson RDCS Referring Phys: [id number] MAURICIO DANIEL ARRIEN  Sonographer Comments: Suboptimal parasternal window. Image acquisition challenging due to patient body habitus. IMPRESSIONS  1. Left ventricular ejection fraction, by estimation, is 60 to 65%. The left ventricle has normal function. The left  ventricle has no regional wall motion abnormalities. There is moderate asymmetric left ventricular hypertrophy of the basal-septal segment. Left ventricular diastolic parameters were normal.  2. Right ventricular systolic function is normal. The right ventricular size is normal.  3. The mitral valve is grossly normal. Trivial mitral valve regurgitation. No evidence of mitral stenosis.  4. The aortic valve was not well visualized. Aortic valve regurgitation is not visualized. No aortic stenosis is present.  5. The inferior vena cava is normal in size with greater than 50% respiratory variability, suggesting right atrial pressure of 3 mmHg. Comparison(s): No significant change from prior study. Conclusion(s)/Recommendation(s): Normal biventricular function without evidence of hemodynamically significant valvular heart disease. Technically challenging images with poor acoustic windows. FINDINGS  Left Ventricle: Left ventricular ejection fraction, by estimation, is 60 to 65%. The left ventricle has normal function. The left ventricle has no regional wall motion abnormalities. The left ventricular internal cavity size was normal in size. There is  moderate asymmetric left ventricular hypertrophy of the basal-septal segment. Left ventricular diastolic parameters were normal. Right Ventricle: The right ventricular size is normal. Right vetricular wall thickness was not well visualized. Right ventricular systolic function is normal. Left Atrium: Left atrial size was normal in size. Right Atrium: Right atrial size was normal in size. Pericardium: There is no evidence of pericardial effusion. Presence of epicardial fat layer. Mitral Valve: The mitral valve is grossly normal. Trivial mitral valve regurgitation. No evidence of mitral valve stenosis. MV peak gradient, 8.1 mmHg. The mean mitral valve gradient is 4.0 mmHg. Tricuspid Valve: The tricuspid valve is grossly normal. Tricuspid valve regurgitation is trivial. No evidence  of tricuspid stenosis. Aortic Valve: The aortic valve was not well visualized. Aortic valve regurgitation is not visualized. No aortic stenosis is present. Aortic valve mean gradient measures 6.0 mmHg. Aortic valve peak gradient measures 10.6 mmHg. Aortic valve area, by VTI measures 3.17 cm. Pulmonic Valve: The pulmonic valve was not well visualized. Pulmonic valve regurgitation is not visualized. No evidence of pulmonic stenosis. Aorta: The aortic root was not well visualized and the ascending aorta was not well visualized. Venous: The inferior vena cava is normal in size with greater than 50% respiratory variability, suggesting right atrial pressure of 3 mmHg. IAS/Shunts: The atrial septum is grossly normal.  LEFT VENTRICLE PLAX 2D LVIDd:         4.20 cm      Diastology LVIDs:         3.00 cm      LV e' medial:    9.03 cm/s LV PW:         1.20 cm      LV E/e' medial:  13.4 LV IVS:        1.70 cm      LV e' lateral:   10.40 cm/s LVOT diam:     2.00 cm      LV E/e' lateral: 11.6 LV SV:         102 LV SV Index:   34 LVOT Area:     3.14 cm  LV Volumes (MOD) LV vol d, MOD A2C: 151.0 ml LV vol d, MOD A4C: 184.0 ml LV vol s, MOD A2C:  57.2 ml LV vol s, MOD A4C: 64.7 ml LV SV MOD A2C:     93.8 ml LV SV MOD A4C:     184.0 ml LV SV MOD BP:      108.2 ml RIGHT VENTRICLE RV S prime:     18.50 cm/s TAPSE (M-mode): 2.3 cm LEFT ATRIUM             Index        RIGHT ATRIUM           Index LA diam:        4.00 cm 1.35 cm/m   RA Area:     14.20 cm LA Vol (A2C):   53.3 ml 18.02 ml/m  RA Volume:   31.00 ml  10.48 ml/m LA Vol (A4C):   57.1 ml 19.31 ml/m LA Biplane Vol: 55.5 ml 18.76 ml/m  AORTIC VALVE AV Area (Vmax):    2.99 cm AV Area (Vmean):   2.88 cm AV Area (VTI):     3.17 cm AV Vmax:           163.00 cm/s AV Vmean:          119.000 cm/s AV VTI:            0.321 m AV Peak Grad:      10.6 mmHg AV Mean Grad:      6.0 mmHg LVOT Vmax:         155.00 cm/s LVOT Vmean:        109.000 cm/s LVOT VTI:          0.324 m LVOT/AV VTI  ratio: 1.01  AORTA Ao Root diam: 3.60 cm MITRAL VALVE MV Area (PHT): 2.54 cm     SHUNTS MV Area VTI:   2.43 cm     Systemic VTI:  0.32 m MV Peak grad:  8.1 mmHg     Systemic Diam: 2.00 cm MV Mean grad:  4.0 mmHg MV Vmax:       1.42 m/s MV Vmean:      94.5 cm/s MV Decel Time: 299 msec MV E velocity: 121.00 cm/s MV A velocity: 109.00 cm/s MV E/A ratio:  1.11 Shelda Bruckner MD Electronically signed by Shelda Bruckner MD Signature Date/Time: 03/11/2024/2:37:22 PM    Final         Scheduled Meds:  azithromycin   500 mg Oral Daily   busPIRone   7.5 mg Oral BID   enoxaparin  (LOVENOX ) injection  100 mg Subcutaneous Q24H   fluticasone  furoate-vilanterol  1 puff Inhalation Daily   furosemide   60 mg Intravenous Q12H   insulin  aspart  0-9 Units Subcutaneous TID WC   losartan   25 mg Oral Daily   methylPREDNISolone  (SOLU-MEDROL ) injection  40 mg Intravenous Daily   sertraline   200 mg Oral QHS   spironolactone   12.5 mg Oral Daily   traZODone   50 mg Oral QHS   Continuous Infusions:   LOS: 3 days    Time spent: 30 minutes    Myrle Dues [employer], PA-S [facility]   To contact the attending provider between 7A-7P or the covering provider during after hours 7P-7A, please log into the web site [email or web address] and access using universal [place] password for that web site. If you do not have the password, please call the hospital operator.  03/12/2024, 1:01 PM   "

## 2024-03-12 NOTE — Progress Notes (Signed)
 Additional PT Note:   SATURATION QUALIFICATIONS: (This note is used to comply with regulatory documentation for home oxygen)  Patient Saturations on Room Air at Rest = 90%  Patient Saturations on Room Air while Ambulating = 87%  Patient Saturations on 4 Liters of oxygen while Ambulating = 90-92%  Please briefly explain why patient needs home oxygen: Pt desats <88% in less than 2 mins on RA and has increased SOB.    Richerd Lipoma, PT  Acute Rehab Services Secure chat preferred Office [phone]

## 2024-03-12 NOTE — TOC Progression Note (Signed)
 Transition of Care [facility]) - Progression Note    Patient Details  Name: Christian [name] MRN: [id number] Date of Birth: 09/05/1975  Transition of [facility] CM/SW Contact  [name], LCSWA Phone Number: 03/12/2024, 11:37 AM  Clinical Narrative:      CSW spoke with patients care coordinator Chrystine San with [employer]. Chrystine informed CSW that patient is in housing program and could be 2-3 months more likely around march before can get patient into housing. Chrystine plans to follow up with patient tomorrow with this information . All questions answered no further questions reported at this time. CSW called [employer] and was directed to [employer]. CSW LVM for cm to call CSW back. CSW will continue to follow.  Expected Discharge Plan:  (TBD) Barriers to Discharge: Continued Medical Work up               Expected Discharge Plan and Services In-house Referral: Clinical Social Work     Living arrangements for the past 2 months: Homeless                                       Social Drivers of Health (SDOH) Interventions SDOH Screenings   Food Insecurity: Food Insecurity Present (03/09/2024)  Housing: High Risk (03/09/2024)  Transportation Needs: Unmet Transportation Needs (03/09/2024)  Utilities: At Risk (03/09/2024)  Alcohol Screen: Low Risk (12/09/2023)  Depression (PHQ2-9): Medium Risk (01/19/2024)  Financial Resource Strain: High Risk (12/09/2023)  Physical Activity: Insufficiently Active (12/09/2023)  Social Connections: Moderately Integrated (12/09/2023)  Stress: Stress Concern Present (12/09/2023)  Tobacco Use: Medium Risk (03/10/2024)  Health Literacy: Adequate Health Literacy (11/17/2022)    Readmission Risk Interventions    11/22/2022   11:09 AM 06/16/2022    1:32 PM  Readmission Risk Prevention Plan  Transportation Screening Complete Complete  PCP or Specialist Appt within 5-7 Days Complete Complete  Home Care Screening Complete Complete   Medication Review (RN CM) Complete Complete

## 2024-03-12 NOTE — Progress Notes (Signed)
 Physical Therapy Treatment Patient Details Name: Christian [name] MRN: [id number] DOB: 10-24-1975 Today's Date: 03/12/2024   History of Present Illness 49 y.o. male presents to [facility]/09[facility] with RSV and HFrEF exacerbation. PMHx:  HFpEF (EF 60-65% in 05/2022), OSA, depression/anxiety, and morbid obesity    PT Comments  Pt received in bed on 4L O2. Pt able to come to EOB with supervision using momentum. Pt performed pregait/ strengthening activities before ambulation and SPO2 dropped from 92% to 87% on RA. Pt tolerated increased ambulation distance with rollator today though continues to need frequent seated rest breaks and SPO2 dropped to 87% on 4L after last bout of ambulation. Education given on safety with use of rollator, esp in relation to sitting down and getting back up. Pt able to teach back safety education. No PT recs at d/c but will continue to follow acutely.      If plan is discharge home, recommend the following: Assist for transportation;Help with stairs or ramp for entrance   Can travel by private vehicle        Equipment Recommendations  None recommended by PT    Recommendations for Other Services       Precautions / Restrictions Precautions Precautions: Fall Recall of Precautions/Restrictions: Intact Precaution/Restrictions Comments: watch O2 Restrictions Weight Bearing Restrictions Per Provider Order: No     Mobility  Bed Mobility Overal bed mobility: Needs Assistance Bed Mobility: Supine to Sit     Supine to sit: Supervision, HOB elevated     General bed mobility comments: used momentum with supervision for safety    Transfers Overall transfer level: Needs assistance Equipment used: Rollator (4 wheels) Transfers: Sit to/from Stand Sit to Stand: Contact guard assist           General transfer comment: vc's for hand placement    Ambulation/Gait Ambulation/Gait assistance: Supervision Gait Distance (Feet): 130 Feet (60', 50', 20', seated rest  breaks in between) Assistive device: Rollator (4 wheels) Gait Pattern/deviations: Step-through pattern, Decreased stride length, Wide base of support Gait velocity: decr Gait velocity interpretation: 1.31 - 2.62 ft/sec, indicative of limited community ambulator Pre-gait activities: STS without O2. Desat to 87% within 2 mins General Gait Details: Wide BOS with increased medial/lateral sway. Took seated rest break when DOE increased to 3/4. SPO2 remained 90-92% on 4L O2 until last bout when it dropped to 87% on 4L O2   Stairs             Wheelchair Mobility     Tilt Bed    Modified Rankin (Stroke Patients Only)       Balance Overall balance assessment: Needs assistance Sitting-balance support: Feet supported, No upper extremity supported Sitting balance-[name]: Good     Standing balance support: Bilateral upper extremity supported, During functional activity, Reliant on assistive device for balance Standing balance-[name]: Poor Standing balance comment: reliant on UE support                            Communication Communication Communication: No apparent difficulties  Cognition Arousal: Alert Behavior During Therapy: WFL for tasks assessed/performed   PT - Cognitive impairments: No apparent impairments                         Following commands: Intact      Cueing Cueing Techniques: Verbal cues, Tactile cues  Exercises      General Comments  Pertinent Vitals/Pain Pain Assessment Pain Assessment: Faces Faces Pain Scale: Hurts little more Pain Location: B knees with gait Pain Descriptors / Indicators: Aching, Discomfort Pain Intervention(s): Limited activity within patient's tolerance, Monitored during session    Home Living                          Prior Function            PT Goals (current goals can now be found in the care plan section) Acute Rehab PT Goals Patient Stated Goal: to feel  better PT Goal Formulation: With patient Time For Goal Achievement: 03/24/24 Potential to Achieve Goals: Good Progress towards PT goals: Progressing toward goals    Frequency    Min 2X/week      PT Plan      Co-evaluation              AM-PAC PT 6 Clicks Mobility   Outcome Measure  Help needed turning from your back to your side while in a flat bed without using bedrails?: A Little Help needed moving from lying on your back to sitting on the side of a flat bed without using bedrails?: A Little Help needed moving to and from a bed to a chair (including a wheelchair)?: A Little Help needed standing up from a chair using your arms (e.g., wheelchair or bedside chair)?: A Little Help needed to walk in hospital room?: A Little Help needed climbing 3-5 steps with a railing? : A Lot 6 Click Score: 17    End of Session Equipment Utilized During Treatment: Oxygen Activity Tolerance: Patient tolerated treatment well Patient left: in chair;with call bell/phone within reach Nurse Communication: Mobility status (SpO2) PT Visit Diagnosis: Unsteadiness on feet (R26.81);Other abnormalities of gait and mobility (R26.89);Muscle weakness (generalized) (M62.81)     Time: 8978-8896 PT Time Calculation (min) (ACUTE ONLY): 42 min  Charges:    $Gait Training: 23-37 mins $Therapeutic Activity: 8-22 mins PT General Charges $$ ACUTE PT VISIT: 1 Visit                     Richerd Lipoma, PT  Acute Rehab Services Secure chat preferred Office [phone]    Richerd CROME Yasir Kitner 03/12/2024, 12:58 PM

## 2024-03-12 NOTE — Progress Notes (Signed)
 Heart Failure Navigator Progress Note  Assessed for Heart & Vascular TOC clinic readiness.  Patient with LVEF 60-65%, no HF TOC per Dr. Noralee.   Navigator available for reassessment of patient.   Duwaine Plant, PharmD, BCPS Heart Failure Stewardship Pharmacist Phone [phone]

## 2024-03-13 ENCOUNTER — Other Ambulatory Visit (HOSPITAL_COMMUNITY): Payer: Self-pay

## 2024-03-13 ENCOUNTER — Telehealth (HOSPITAL_COMMUNITY): Payer: Self-pay

## 2024-03-13 DIAGNOSIS — I5033 Acute on chronic diastolic (congestive) heart failure: Secondary | ICD-10-CM | POA: Diagnosis not present

## 2024-03-13 DIAGNOSIS — J9601 Acute respiratory failure with hypoxia: Secondary | ICD-10-CM | POA: Diagnosis not present

## 2024-03-13 LAB — BASIC METABOLIC PANEL WITH GFR
Anion gap: 10 (ref 5–15)
BUN: 23 mg/dL — ABNORMAL HIGH (ref 6–20)
CO2: 30 mmol/L (ref 22–32)
Calcium: 9.1 mg/dL (ref 8.9–10.3)
Chloride: 104 mmol/L (ref 98–111)
Creatinine, Ser: 0.98 mg/dL (ref 0.61–1.24)
GFR, Estimated: >60 mL/min
Glucose, Bld: 104 mg/dL — ABNORMAL HIGH (ref 70–99)
Potassium: 3.4 mmol/L — ABNORMAL LOW (ref 3.5–5.1)
Sodium: 143 mmol/L (ref 135–145)

## 2024-03-13 LAB — GLUCOSE, CAPILLARY
Glucose-Capillary: 104 mg/dL — ABNORMAL HIGH (ref 70–99)
Glucose-Capillary: 131 mg/dL — ABNORMAL HIGH (ref 70–99)
Glucose-Capillary: 138 mg/dL — ABNORMAL HIGH (ref 70–99)
Glucose-Capillary: 122 mg/dL — ABNORMAL HIGH (ref 70–99)

## 2024-03-13 LAB — MAGNESIUM: Magnesium: 2.5 mg/dL — ABNORMAL HIGH (ref 1.7–2.4)

## 2024-03-13 MED ORDER — POTASSIUM CHLORIDE CRYS ER 20 MEQ PO TBCR
40.0000 meq | EXTENDED_RELEASE_TABLET | Freq: Every day | ORAL | Status: DC
  Administered 2024-03-13: 40 meq via ORAL
  Filled 2024-03-13: qty 2

## 2024-03-13 MED ORDER — DAPAGLIFLOZIN PROPANEDIOL 10 MG PO TABS
10.0000 mg | ORAL_TABLET | Freq: Every day | ORAL | Status: DC
  Administered 2024-03-13 – 2024-03-14 (×2): 10 mg via ORAL
  Filled 2024-03-13 (×2): qty 1

## 2024-03-13 MED ORDER — PREDNISONE 20 MG PO TABS
40.0000 mg | ORAL_TABLET | Freq: Every day | ORAL | Status: DC
  Administered 2024-03-14: 40 mg via ORAL
  Filled 2024-03-13: qty 2

## 2024-03-13 NOTE — Progress Notes (Addendum)
 Physical Therapy Treatment Patient Details Name: Christian [name] MRN: [id number] DOB: 04-20-1975 Today's Date: 03/13/2024   History of Present Illness 49 y.o. male presents [facility] 03/09/24 with RSV and HFrEF exacerbation. PMHx:  HFpEF (EF 60-65% in 05/2022), OSA, depression/anxiety, and morbid obesity    PT Comments  Pt received in recliner and agreeable to PT session. Focused session on standing BLE exercises for strengthening per pt's request. Pt required short seated rest breaks in between exercises due to fatigue. Continues to require supervision to stand and ambulate short distances with use of rollator. Discussed d/c from the hospital and low impact aerobic exercise including water aerobics and seated exercise biking. Acute PT to continue to follow.   92-93% SpO2 on 4L    If plan is discharge home, recommend the following: Assist for transportation;Help with stairs or ramp for entrance   Can travel by private vehicle      Yes  Equipment Recommendations  None recommended by PT       Precautions / Restrictions Precautions Precautions: Fall Recall of Precautions/Restrictions: Intact Precaution/Restrictions Comments: watch O2 Restrictions Weight Bearing Restrictions Per Provider Order: No     Mobility  Bed Mobility  General bed mobility comments: Received in recliner    Transfers Overall transfer level: Needs assistance Equipment used: Rollator (4 wheels) Transfers: Sit to/from Stand Sit to Stand: Supervision    General transfer comment: supervision for safety, occasional cues lock brakes prior to return to seated position    Ambulation/Gait Ambulation/Gait assistance: Supervision Gait Distance (Feet): 20 Feet (x2) Assistive device: Rollator (4 wheels) Gait Pattern/deviations: Step-through pattern, Decreased stride length, Wide base of support Gait velocity: decr    General Gait Details: Wide BOS with increased medial/lateral sway.      Balance Overall balance  assessment: Needs assistance Sitting-balance support: Feet supported, No upper extremity supported Sitting balance-[name]: Good    Standing balance support: Bilateral upper extremity supported, During functional activity, Reliant on assistive device for balance Standing balance-[name]: Poor Standing balance comment: reliant on UE support     Communication Communication Communication: No apparent difficulties  Cognition Arousal: Alert Behavior During Therapy: WFL for tasks assessed/performed   PT - Cognitive impairments: No apparent impairments    Following commands: Intact      Cueing Cueing Techniques: Verbal cues, Tactile cues  Exercises General Exercises - Lower Extremity Hip ABduction/ADduction: AROM, Both, 15 reps, Standing Hip Flexion/Marching: AROM, Both, 10 reps, Standing Other Exercises Other Exercises: x5 standing hip extension with BUE support Other Exercises: x5 serial sit<>stand with rollator        Pertinent Vitals/Pain Pain Assessment Pain Assessment: Faces Faces Pain Scale: Hurts a little bit Pain Location: bil knees Pain Descriptors / Indicators: Aching, Discomfort Pain Intervention(s): Limited activity within patient's tolerance, Monitored during session, Repositioned     PT Goals (current goals can now be found in the care plan section) Acute Rehab PT Goals PT Goal Formulation: With patient Time For Goal Achievement: 03/24/24 Potential to Achieve Goals: Good Progress towards PT goals: Progressing toward goals    Frequency    Min 2X/week       AM-PAC PT 6 Clicks Mobility   Outcome Measure  Help needed turning from your back to your side while in a flat bed without using bedrails?: A Little Help needed moving from lying on your back to sitting on the side of a flat bed without using bedrails?: A Little Help needed moving to and from a bed to a chair (  including a wheelchair)?: A Little Help needed standing up from a chair using  your arms (e.g., wheelchair or bedside chair)?: A Little Help needed to walk in hospital room?: A Little Help needed climbing 3-5 steps with a railing? : A Lot 6 Click Score: 17    End of Session Equipment Utilized During Treatment: Oxygen Activity Tolerance: Patient tolerated treatment well Patient left: in chair;with call bell/phone within reach Nurse Communication: Mobility status PT Visit Diagnosis: Unsteadiness on feet (R26.81);Other abnormalities of gait and mobility (R26.89);Muscle weakness (generalized) (M62.81)     Time: 8553-8490 PT Time Calculation (min) (ACUTE ONLY): 23 min  Charges:    $Therapeutic Exercise: 23-37 mins PT General Charges $$ ACUTE PT VISIT: 1 Visit                    Kate ORN, PT, DPT Secure Chat Preferred  Rehab Office [phone]   Kate BRAVO [name] 03/13/2024, 4:04 PM

## 2024-03-13 NOTE — Progress Notes (Signed)
 RT went to place patient on CPAP.  Patient not ready.  Will have RN call when ready to be placed on.

## 2024-03-13 NOTE — Telephone Encounter (Signed)
 Pharmacy Patient Advocate Encounter  Received notification from [facility] MEDICAID that Prior Authorization for Farxiga  10MG  tablets has been APPROVED from 03/13/24 to 03/13/25. Ran test claim, Copay is $4. This test claim was processed through [facility]- copay amounts may vary at other pharmacies due to pharmacy/plan contracts, or as the patient moves through the different stages of their insurance plan.   PA #/Case ID/Reference #: [id number]

## 2024-03-13 NOTE — TOC Progression Note (Addendum)
 Transition of Care [facility]) - Progression Note    Patient Details  Name: Christian [name] MRN: [id number] Date of Birth: 07-Sep-1975  Transition of Care [facility]) CM/SW Contact  [name], LCSWA Phone Number: 03/13/2024, 12:21 PM  Clinical Narrative:     CSW spoke with patient he said he has not confirmed yet of anyone that is able to pick up his belongings at [facility] in [place]. Patient agreeable for CSW to call Prentice with [facility] to see if someone at facility can drop his belongings off at the hospital. CSW spoke with patients care coordinator who stopped by to see patient today at hospital. Patients care coordinator Chrystine informed CSW that she is working on trying to secure a bed for patient at a hotel/extended stay by the time patient is medically ready for dc. Patient agreeable for CSW to continue to try and see if able to find him a shelter bed just in case care coordinator unable to secure extended stay bed.  CSW called [facility] and LVM for [employer] . CSW awaiting call back. CSW will continue to follow.   Update- CSW tried to call [facility] again. Unable to reach [facility] open publishing rights manager, was transferred to [employer] case apple computer. CSW LVM . CSW awaiting call back. CSW updated patient. CSW informed patient currently unable to secure shelter bed in [place]. Patient agreeable for CSW to search in [place] area. CSW called [facility] center. They are currently having computer problems and unable to look to see availability. [facility] request for CSW to call back.  Update- CSW called [facility] back who informed CSW that computers are back up. Shelter currently does not have any male bed available. [facility] informed CSW that CSW will need to call early the day the patient is ready and see if facility has availability.  Expected Discharge Plan:  (TBD) Barriers to Discharge: Continued Medical Work up               Expected  Discharge Plan and Services In-house Referral: Clinical Social Work     Living arrangements for the past 2 months: Homeless                                       Social Drivers of Health (SDOH) Interventions SDOH Screenings   Food Insecurity: Food Insecurity Present (03/09/2024)  Housing: High Risk (03/09/2024)  Transportation Needs: Unmet Transportation Needs (03/09/2024)  Utilities: At Risk (03/09/2024)  Alcohol Screen: Low Risk (12/09/2023)  Depression (PHQ2-9): Medium Risk (01/19/2024)  Financial Resource Strain: High Risk (12/09/2023)  Physical Activity: Insufficiently Active (12/09/2023)  Social Connections: Moderately Integrated (12/09/2023)  Stress: Stress Concern Present (12/09/2023)  Tobacco Use: Medium Risk (03/10/2024)  Health Literacy: Adequate Health Literacy (11/17/2022)    Readmission Risk Interventions    11/22/2022   11:09 AM 06/16/2022    1:32 PM  Readmission Risk Prevention Plan  Transportation Screening Complete Complete  PCP or Specialist Appt within 5-7 Days Complete Complete  Home Care Screening Complete Complete  Medication Review (RN CM) Complete Complete

## 2024-03-13 NOTE — Progress Notes (Signed)
 "  [facility] PROGRESS NOTE   Christian [name] FMW:[id number] DOB: 10-06-1975 DOA: 03/09/2024  PCP: Tanda Bleacher, MD  Brief History: 49 yo male with the past medical history of heart failure, OSA, obesity and depression who presented with dyspnea. On the day of admission he had severe symptoms, EMS was called and he was found in respiratory distress with low 02 saturation. He was placed on 4 L/min per [place] of supplemental 02 and was transported to the ED.  On his initial physical examination his blood pressure was 153/82, HR 103, RR 22 and 02 saturation 91%  Lungs with bilateral rales with diffuse rhonchi, heart with S1 and S2 present and regular with no gallops or rubs, abdomen with no distention, soft and non tender, positive lower extremity edema.    VBG 7,53/ 38.8/ 98/ 34/ 98%  Na 140, K 3.3 Cl 101, bicarbonate 28 glucose 176 bun 13 cr 0,97  BNP 872  High sensitive troponin 20 and 21  Lactic acid 1,7 and 1,8  Wbc 9,5 hgb 14.2 plt 270    RSV positive  Influenza negative Sars COVID 19 negative    CT chest with bilateral ground glass opacities,    Chest radiograph with hyperinflation, positive cardiomegaly, bilateral hilar vascular congestion, bilateral central interstitial infiltrates predominant at lower zones, positive cephalization of the vasculature.   Patient was subsequently hospitalized for further management.     Subjective/Interval History: Patient mentioned that he feels better.  Shortness of breath is improved.  Denies any chest pain.  No nausea vomiting.  No dizziness or lightheadedness.    Assessment/Plan:  Acute on chronic diastolic CHF (congestive heart failure) (HCC) Echocardiogram with preserved LV systolic function EF 60 to 65%, moderate asymmetric left ventricular hypertrophy, RV systolic function preserved, no significant valvular dysfunction.  Patient diuresed well.  Transitioned to torsemide . Patient currently on losartan , spironolactone ,  torsemide . Started on Farxiga  this morning.  Acute respiratory failure with hypoxia Noted to have oxygen requirements. Likely multifactorial including RSV, diastolic CHF, asthma. Might need oxygen at discharge.   Essential hypertension Continue with losartan  for blood pressure control Reasonably well-controlled.   Asthma, chronic, unspecified asthma severity, with acute exacerbation Not much wheezing appreciated this morning.  Patient noted to be on azithromycin .  Noted to be on Solu-Medrol  which can be changed to oral. Continue Breo Ellipta .  RSV infection Multifocal opacities noted on CT chest.  Likely due to RSV.  Stable for the most part.  Supportive care.  Hypokalemia Continue to supplement as indicated.  Magnesium  is 2.5.  Class III obesity Estimated body mass index is 53.01 kg/m as calculated from the following:   Height as of this encounter: 6' 2 (1.88 m).   Weight as of this encounter: 187.3 kg.    DVT Prophylaxis: Lovenox  Code Status: Full code Family Communication: Discussed with patient Disposition Plan: He is experiencing homelessness.  TOC is following.    Medications: Scheduled:  azithromycin   500 mg Oral Daily   busPIRone   7.5 mg Oral BID   dapagliflozin  propanediol  10 mg Oral Daily   enoxaparin  (LOVENOX ) injection  100 mg Subcutaneous Q24H   fluticasone  furoate-vilanterol  1 puff Inhalation Daily   insulin  aspart  0-9 Units Subcutaneous TID WC   losartan   25 mg Oral Daily   methylPREDNISolone  (SOLU-MEDROL ) injection  40 mg Intravenous Daily   potassium chloride   40 mEq Oral Daily   sertraline   200 mg Oral QHS   spironolactone   12.5 mg Oral Daily  torsemide   60 mg Oral Daily   traZODone   50 mg Oral QHS   Continuous: PRN:albuterol , guaiFENesin -dextromethorphan , mouth rinse  Antibiotics: Anti-infectives (From admission, onward)    Start     Dose/Rate Route Frequency Ordered Stop   03/13/24 1000  azithromycin  (ZITHROMAX ) tablet 500 mg         500 mg Oral Daily 03/12/24 1754 03/15/24 0959   03/10/24 1500  azithromycin  (ZITHROMAX ) tablet 500 mg  Status:  Discontinued        500 mg Oral Daily 03/10/24 1404 03/12/24 1754       Objective:  Vital Signs  Vitals:   03/13/24 0749 03/13/24 0858 03/13/24 0900 03/13/24 1200  BP:  131/78 131/78 125/71  Pulse:  71 78 84  Resp:  18  18  Temp:  97.6 F (36.4 C)  97.7 F (36.5 C)  TempSrc:  Oral  Oral  SpO2: 92% 93% 93% 91%  Weight:      Height:        Intake/Output Summary (Last 24 hours) at 03/13/2024 1259 Last data filed at 03/13/2024 1205 Gross per 24 hour  Intake 1680 ml  Output 2460 ml  Net -780 ml   Filed Weights   03/09/24 2131  Weight: (!) 187.3 kg    General appearance: Awake alert.  In no distress Resp: Normal effort at rest.  Able to lie flat in the bed.  Few crackles at the bases.  No wheezing or rhonchi. Cardio: S1-S2 is normal regular.  No S3-S4.  No rubs murmurs or bruit GI: Abdomen is soft.  Nontender nondistended.  Bowel sounds are present normal.  No masses organomegaly Extremities: No edema.  Full range of motion of lower extremities. Neurologic: Alert and oriented x3.  No focal neurological deficits.    Lab Results:  Data Reviewed: I have personally reviewed following labs and reports of the imaging studies  CBC: Recent Labs  Lab 03/09/24 1455 03/09/24 1508 03/09/24 1741 03/10/24 0021 03/11/24 0434  WBC 9.5  --  9.8 8.4 10.5  NEUTROABS 6.9  --   --   --   --   HGB 14.2 13.9 14.3 14.6 12.9*  HCT 41.3 41.0 42.9 42.7 38.6*  MCV 81.5  --  83.6 81.5 82.3  PLT 270  --  271 290 297    Basic Metabolic Panel: Recent Labs  Lab 03/09/24 1455 03/09/24 1508 03/09/24 1741 03/10/24 0021 03/11/24 0434 03/12/24 0948 03/13/24 0521  NA 140 138  --  139 141 141 143  K 3.3* 3.9  --  3.2* 2.9* 3.4* 3.4*  CL 101  --   --  100 100 100 104  CO2 28  --   --  28 32 32 30  GLUCOSE 176*  --   --  223* 156* 140* 104*  BUN 13  --   --  15 19 20  23*   CREATININE 0.97  --  0.94 1.08 0.95 1.00 0.98  CALCIUM  8.5*  --   --  8.8* 8.5* 8.7* 9.1  MG  --   --   --   --  2.3 2.2 2.5*    GFR: Estimated Creatinine Clearance: 158.4 mL/min (by C-G formula based on SCr of 0.98 mg/dL).  Liver Function Tests: Recent Labs  Lab 03/09/24 1455  AST 29  ALT 19  ALKPHOS 88  BILITOT 0.8  PROT 6.8  ALBUMIN 3.6    BNP (last 3 results) Recent Labs    07/28/23 1008 11/08/23 2127 03/09/24 1455  PROBNP 124* 147.0 872.0*    HbA1C: Recent Labs    03/10/24 1430  HGBA1C 6.0*    CBG: Recent Labs  Lab 03/12/24 1155 03/12/24 1702 03/12/24 2113 03/13/24 0856 03/13/24 1242  GLUCAP 148* 157* 145* 104* 131*    Recent Results (from the past 240 hours)  Resp panel by RT-PCR (RSV, Flu A&B, Covid) Peripheral     Status: Abnormal   Collection Time: 03/09/24  2:30 PM   Specimen: Peripheral; Nasal Swab  Result Value Ref Range Status   SARS Coronavirus 2 by RT PCR NEGATIVE NEGATIVE Final   Influenza A by PCR NEGATIVE NEGATIVE Final   Influenza B by PCR NEGATIVE NEGATIVE Final    Comment: (NOTE) The Xpert Xpress SARS-CoV-2/FLU/RSV plus assay is intended as an aid in the diagnosis of influenza from Nasopharyngeal swab specimens and should not be used as a sole basis for treatment. Nasal washings and aspirates are unacceptable for Xpert Xpress SARS-CoV-2/FLU/RSV testing.  Fact Sheet for Patients: bloggercourse.com  Fact Sheet for Healthcare Providers: seriousbroker.it  This test is not yet approved or cleared by the [place]  FDA and has been authorized for detection and/or diagnosis of SARS-CoV-2 by FDA under an Emergency Use Authorization (EUA). This EUA will remain in effect (meaning this test can be used) for the duration of the COVID-19 declaration under Section 564(b)(1) of the Act, 21 U.S.C. section 360bbb-3(b)(1), unless the authorization is terminated or revoked.     Resp  Syncytial Virus by PCR POSITIVE (A) NEGATIVE Final    Comment: (NOTE) Fact Sheet for Patients: bloggercourse.com  Fact Sheet for Healthcare Providers: seriousbroker.it  This test is not yet approved or cleared by the [place]  FDA and has been authorized for detection and/or diagnosis of SARS-CoV-2 by FDA under an Emergency Use Authorization (EUA). This EUA will remain in effect (meaning this test can be used) for the duration of the COVID-19 declaration under Section 564(b)(1) of the Act, 21 U.S.C. section 360bbb-3(b)(1), unless the authorization is terminated or revoked.  Performed at [facility], [address], [place], [place] [address]   Culture, blood (routine x 2)     Status: None (Preliminary result)   Collection Time: 03/09/24  2:55 PM   Specimen: BLOOD RIGHT HAND  Result Value Ref Range Status   Specimen Description BLOOD RIGHT HAND  Final   Special Requests   Final    BOTTLES DRAWN AEROBIC AND ANAEROBIC Blood Culture adequate volume   Culture   Final    NO GROWTH 4 DAYS Performed at [facility], [address], [place], [place] [address]    Report Status PENDING  Incomplete  Culture, blood (routine x 2)     Status: None (Preliminary result)   Collection Time: 03/10/24 12:21 AM   Specimen: BLOOD LEFT HAND  Result Value Ref Range Status   Specimen Description BLOOD LEFT HAND  Final   Special Requests   Final    BOTTLES DRAWN AEROBIC AND ANAEROBIC Blood Culture adequate volume   Culture   Final    NO GROWTH 3 DAYS Performed at [facility], [address] [place], [place] [address]    Report Status PENDING  Incomplete      Radiology Studies: No results found.     LOS: 4 days   Christian [name] [employer] on [email or web address]  03/13/2024, 12:59 PM   "

## 2024-03-13 NOTE — Progress Notes (Signed)
 Occupational Therapy Treatment Patient Details Name: Christian [name] MRN: [id number] DOB: 08-14-1975 Today's Date: 03/13/2024   History of present illness 49 y.o. male presents to [facility] 03/09/24 with RSV and HFrEF exacerbation. PMHx:  HFpEF (EF 60-65% in 05/2022), OSA, depression/anxiety, and morbid obesity   OT comments  Pt progressing well towards goals. Session focused on improving activity tolerance for ADLs. Progressed OOB activity time to 15 minutes, pt requiring x3 seated rest breaks after activity. Provided and reviewed use of reacher, long handled sponge, and shoe horn; pt verbalized understanding. Pt with difficulty completing pericare thoroughly d/t increase body habitus, will trial toileting aid at neck session. Continue to recommend no follow up OT. Will continue to follow acutely.       If plan is discharge home, recommend the following:  A little help with walking and/or transfers;A little help with bathing/dressing/bathroom;Assistance with cooking/housework   Equipment Recommendations  None recommended by OT       Precautions / Restrictions Precautions Precautions: Fall Recall of Precautions/Restrictions: Intact Precaution/Restrictions Comments: watch O2 Restrictions Weight Bearing Restrictions Per Provider Order: No       Mobility Bed Mobility       General bed mobility comments: Received in recliner    Transfers Overall transfer level: Needs assistance Equipment used: Rollator (4 wheels) Transfers: Sit to/from Stand Sit to Stand: Contact guard assist           General transfer comment: CGA for balance. Cues for use of brakes     Balance Overall balance assessment: Needs assistance Sitting-balance support: Feet supported, No upper extremity supported Sitting balance-[name]: Good     Standing balance support: Bilateral upper extremity supported, During functional activity, Reliant on assistive device for balance Standing balance-[name]:  Poor Standing balance comment: reliant on UE support           ADL either performed or assessed with clinical judgement   ADL Overall ADL's : Needs assistance/impaired     Grooming: Contact guard assist;Standing;Wash/dry hands Grooming Details (indicate cue type and reason): CGA for balance             Lower Body Dressing: Sit to/from stand;Contact guard assist Lower Body Dressing Details (indicate cue type and reason): Reviewed use of AE to assist prn Toilet Transfer: Contact guard assist;Rolling walker (2 wheels);Ambulation;Regular Toilet;Grab bars   Toileting- Clothing Manipulation and Hygiene: Moderate assistance;Sit to/from stand Toileting - Clothing Manipulation Details (indicate cue type and reason): Assist to complete last 50% of pericare     Functional mobility during ADLs: Contact guard assist;Rolling walker (2 wheels) General ADL Comments: Limited by decreased activity tolerance    Extremity/Trunk Assessment Upper Extremity Assessment Upper Extremity Assessment: Generalized weakness   Lower Extremity Assessment Lower Extremity Assessment: Defer to PT evaluation        Vision   Vision Assessment?: No apparent visual deficits         Communication Communication Communication: No apparent difficulties   Cognition Arousal: Alert Behavior During Therapy: WFL for tasks assessed/performed Cognition: No apparent impairments       Following commands: Intact        Cueing   Cueing Techniques: Verbal cues, Tactile cues        General Comments O2 sats stable on 4L. Tachycardic with last 37ft of mobility max HR 145 bpm, RN notified    Pertinent Vitals/ Pain       Pain Assessment Pain Assessment: Faces Faces Pain Scale: Hurts a little bit Pain Location: bil knees  Pain Descriptors / Indicators: Aching, Discomfort Pain Intervention(s): Monitored during session   Frequency  Min 2X/week        Progress Toward Goals  OT Goals(current goals can  now be found in the care plan section)  Progress towards OT goals: Progressing toward goals  Acute Rehab OT Goals Patient Stated Goal: To eat OT Goal Formulation: With patient Time For Goal Achievement: 03/24/24 Potential to Achieve Goals: Good ADL Goals Pt Will Perform Grooming: with modified independence;standing Pt Will Perform Lower Body Dressing: with modified independence;sit to/from stand;sitting/lateral leans;with adaptive equipment Pt Will Transfer to Toilet: with modified independence;ambulating;regular height toilet Pt Will Perform Toileting - Clothing Manipulation and hygiene: with modified independence;with adaptive equipment;sitting/lateral leans;sit to/from stand Additional ADL Goal #1: Pt will verbalize at least 3 energy conservation strategies for ADLs  Plan         AM-PAC OT 6 Clicks Daily Activity     Outcome Measure   Help from another person eating meals?: None Help from another person taking care of personal grooming?: A Little Help from another person toileting, which includes using toliet, bedpan, or urinal?: A Lot Help from another person bathing (including washing, rinsing, drying)?: A Little Help from another person to put on and taking off regular upper body clothing?: A Little Help from another person to put on and taking off regular lower body clothing?: A Little 6 Click Score: 18    End of Session Equipment Utilized During Treatment: Rollator (4 wheels)  OT Visit Diagnosis: Unsteadiness on feet (R26.81);Other abnormalities of gait and mobility (R26.89);Muscle weakness (generalized) (M62.81)   Activity Tolerance Patient tolerated treatment well   Patient Left in chair;with call bell/phone within reach   Nurse Communication Mobility status        Time: 8693-8667 OT Time Calculation (min): 26 min  Charges: OT General Charges $OT Visit: 1 Visit OT Treatments $Self Care/Home Management : 23-37 mins  Adrianne BROCKS, OT  Acute Rehabilitation  Services Office [phone] Secure chat preferred   Adrianne GORMAN [employer]vers 03/13/2024, 4:19 PM

## 2024-03-14 ENCOUNTER — Other Ambulatory Visit (HOSPITAL_COMMUNITY): Payer: Self-pay

## 2024-03-14 DIAGNOSIS — I5033 Acute on chronic diastolic (congestive) heart failure: Secondary | ICD-10-CM | POA: Diagnosis not present

## 2024-03-14 LAB — POTASSIUM: Potassium: 4.2 mmol/L (ref 3.5–5.1)

## 2024-03-14 LAB — GLUCOSE, CAPILLARY
Glucose-Capillary: 100 mg/dL — ABNORMAL HIGH (ref 70–99)
Glucose-Capillary: 142 mg/dL — ABNORMAL HIGH (ref 70–99)

## 2024-03-14 LAB — BASIC METABOLIC PANEL WITH GFR
Anion gap: 10 (ref 5–15)
BUN: 28 mg/dL — ABNORMAL HIGH (ref 6–20)
CO2: 31 mmol/L (ref 22–32)
Calcium: 8.6 mg/dL — ABNORMAL LOW (ref 8.9–10.3)
Chloride: 101 mmol/L (ref 98–111)
Creatinine, Ser: 1.23 mg/dL (ref 0.61–1.24)
GFR, Estimated: >60 mL/min
Glucose, Bld: 126 mg/dL — ABNORMAL HIGH (ref 70–99)
Potassium: 3.1 mmol/L — ABNORMAL LOW (ref 3.5–5.1)
Sodium: 142 mmol/L (ref 135–145)

## 2024-03-14 LAB — CULTURE, BLOOD (ROUTINE X 2)
Culture: NO GROWTH
Special Requests: ADEQUATE

## 2024-03-14 MED ORDER — POTASSIUM CHLORIDE CRYS ER 20 MEQ PO TBCR
40.0000 meq | EXTENDED_RELEASE_TABLET | ORAL | Status: AC
  Administered 2024-03-14 (×2): 40 meq via ORAL
  Filled 2024-03-14 (×2): qty 2

## 2024-03-14 MED ORDER — POTASSIUM CHLORIDE CRYS ER 20 MEQ PO TBCR
20.0000 meq | EXTENDED_RELEASE_TABLET | Freq: Every day | ORAL | 0 refills | Status: AC
  Filled 2024-03-14: qty 30, 30d supply, fill #0

## 2024-03-14 MED ORDER — TORSEMIDE 20 MG PO TABS
60.0000 mg | ORAL_TABLET | Freq: Every day | ORAL | 0 refills | Status: AC
  Filled 2024-03-14: qty 120, 40d supply, fill #0

## 2024-03-14 MED ORDER — DAPAGLIFLOZIN PROPANEDIOL 10 MG PO TABS
10.0000 mg | ORAL_TABLET | Freq: Every day | ORAL | 0 refills | Status: AC
  Filled 2024-03-14: qty 90, 90d supply, fill #0

## 2024-03-14 MED ORDER — SPIRONOLACTONE 25 MG PO TABS
25.0000 mg | ORAL_TABLET | Freq: Every day | ORAL | 0 refills | Status: AC
  Filled 2024-03-14: qty 60, 60d supply, fill #0

## 2024-03-14 MED ORDER — PREDNISONE 20 MG PO TABS
ORAL_TABLET | ORAL | 0 refills | Status: AC
  Filled 2024-03-14: qty 9, 6d supply, fill #0

## 2024-03-14 MED ORDER — SALINE SPRAY 0.65 % NA SOLN: 1.0000 | NASAL | Status: DC | PRN

## 2024-03-14 MED ORDER — LOSARTAN POTASSIUM 25 MG PO TABS
25.0000 mg | ORAL_TABLET | Freq: Every day | ORAL | 0 refills | Status: AC
  Filled 2024-03-14: qty 90, 90d supply, fill #0

## 2024-03-14 NOTE — Progress Notes (Signed)
 "  [facility] PROGRESS NOTE   Christian [name] FMW:[id number] DOB: 09/18/1975 DOA: 03/09/2024  PCP: Tanda Bleacher, MD  Brief History: 49 yo male with the past medical history of heart failure, OSA, obesity and depression who presented with dyspnea. On the day of admission he had severe symptoms, EMS was called and he was found in respiratory distress with low 02 saturation. He was placed on 4 L/min per [place] of supplemental 02 and was transported to the ED.  On his initial physical examination his blood pressure was 153/82, HR 103, RR 22 and 02 saturation 91%  Lungs with bilateral rales with diffuse rhonchi, heart with S1 and S2 present and regular with no gallops or rubs, abdomen with no distention, soft and non tender, positive lower extremity edema.    VBG 7,53/ 38.8/ 98/ 34/ 98%  Na 140, K 3.3 Cl 101, bicarbonate 28 glucose 176 bun 13 cr 0,97  BNP 872  High sensitive troponin 20 and 21  Lactic acid 1,7 and 1,8  Wbc 9,5 hgb 14.2 plt 270    RSV positive  Influenza negative Sars COVID 19 negative    CT chest with bilateral ground glass opacities,    Chest radiograph with hyperinflation, positive cardiomegaly, bilateral hilar vascular congestion, bilateral central interstitial infiltrates predominant at lower zones, positive cephalization of the vasculature.   Patient was subsequently hospitalized for further management.     Subjective/Interval History: Patient feels well.  Denies any shortness of breath or chest pain.  His sister is at the bedside.  She mentioned that she may be able to help him at discharge.  She may be able to take him home with her for a few days.    Assessment/Plan:  Acute on chronic diastolic CHF (congestive heart failure) (HCC) Echocardiogram with preserved LV systolic function EF 60 to 65%, moderate asymmetric left ventricular hypertrophy, RV systolic function preserved, no significant valvular dysfunction.  Patient diuresed well.  Transitioned to  torsemide . Patient also on losartan  and spironolactone  and Farxiga . Stable for the most part.  He has diuresed well.  Acute respiratory failure with hypoxia Noted to have oxygen requirements. Likely multifactorial including RSV, diastolic CHF, asthma. Home oxygen has been ordered.   Essential hypertension Continue with losartan  for blood pressure control Reasonably well-controlled.   Asthma, chronic, unspecified asthma severity, with acute exacerbation Continue inhalers.  Steroids changed to oral with quick taper.  Respiratory status stable for the most part.  RSV infection Multifocal opacities noted on CT chest.  Likely due to RSV.  Stable for the most part.  Supportive care.  Hypokalemia Continue to supplement as indicated.  Magnesium  is 2.5.  Class III obesity Estimated body mass index is 53.01 kg/m as calculated from the following:   Height as of this encounter: 6' 2 (1.88 m).   Weight as of this encounter: 187.3 kg.    DVT Prophylaxis: Lovenox  Code Status: Full code Family Communication: Discussed with patient Disposition Plan: Sister plans to take him home with her.  Hopefully discharge later today.    Medications: Scheduled:  busPIRone   7.5 mg Oral BID   dapagliflozin  propanediol  10 mg Oral Daily   enoxaparin  (LOVENOX ) injection  100 mg Subcutaneous Q24H   fluticasone  furoate-vilanterol  1 puff Inhalation Daily   insulin  aspart  0-9 Units Subcutaneous TID WC   losartan   25 mg Oral Daily   potassium chloride   40 mEq Oral Q4H   predniSONE   40 mg Oral Q breakfast   sertraline   200 mg Oral QHS   spironolactone   12.5 mg Oral Daily   torsemide   60 mg Oral Daily   traZODone   50 mg Oral QHS   Continuous: PRN:albuterol , guaiFENesin -dextromethorphan , mouth rinse, sodium chloride   Antibiotics: Anti-infectives (From admission, onward)    Start     Dose/Rate Route Frequency Ordered Stop   03/13/24 1000  azithromycin  (ZITHROMAX ) tablet 500 mg        500 mg Oral  Daily 03/12/24 1754 03/14/24 0810   03/10/24 1500  azithromycin  (ZITHROMAX ) tablet 500 mg  Status:  Discontinued        500 mg Oral Daily 03/10/24 1404 03/12/24 1754       Objective:  Vital Signs  Vitals:   03/13/24 2058 03/14/24 0027 03/14/24 0407 03/14/24 0800  BP: 128/78 128/67 132/86 (!) 140/78  Pulse:  85 71 64  Resp: 20 16 20 17   Temp: 97.7 F (36.5 C) 98.3 F (36.8 C) 98.5 F (36.9 C) 97.8 F (36.6 C)  TempSrc: Oral Oral Oral Oral  SpO2:  93% 95% 96%  Weight:      Height:        Intake/Output Summary (Last 24 hours) at 03/14/2024 1050 Last data filed at 03/14/2024 0910 Gross per 24 hour  Intake 480 ml  Output 2735 ml  Net -2255 ml   Filed Weights   03/09/24 2131  Weight: (!) 187.3 kg    General appearance: Awake alert.  In no distress Resp: Clear to auscultation bilaterally.  Normal effort Cardio: S1-S2 is normal regular.  No S3-S4.  No rubs murmurs or bruit GI: Abdomen is soft.  Nontender nondistended.  Bowel sounds are present normal.  No masses organomegaly   Lab Results:  Data Reviewed: I have personally reviewed following labs and reports of the imaging studies  CBC: Recent Labs  Lab 03/09/24 1455 03/09/24 1508 03/09/24 1741 03/10/24 0021 03/11/24 0434  WBC 9.5  --  9.8 8.4 10.5  NEUTROABS 6.9  --   --   --   --   HGB 14.2 13.9 14.3 14.6 12.9*  HCT 41.3 41.0 42.9 42.7 38.6*  MCV 81.5  --  83.6 81.5 82.3  PLT 270  --  271 290 297    Basic Metabolic Panel: Recent Labs  Lab 03/10/24 0021 03/11/24 0434 03/12/24 0948 03/13/24 0521 03/14/24 0452  NA 139 141 141 143 142  K 3.2* 2.9* 3.4* 3.4* 3.1*  CL 100 100 100 104 101  CO2 28 32 32 30 31  GLUCOSE 223* 156* 140* 104* 126*  BUN 15 19 20  23* 28*  CREATININE 1.08 0.95 1.00 0.98 1.23  CALCIUM  8.8* 8.5* 8.7* 9.1 8.6*  MG  --  2.3 2.2 2.5*  --     GFR: Estimated Creatinine Clearance: 126.2 mL/min (by C-G formula based on SCr of 1.23 mg/dL).  Liver Function Tests: Recent Labs  Lab  03/09/24 1455  AST 29  ALT 19  ALKPHOS 88  BILITOT 0.8  PROT 6.8  ALBUMIN 3.6    BNP (last 3 results) Recent Labs    07/28/23 1008 11/08/23 2127 03/09/24 1455  PROBNP 124* 147.0 872.0*     CBG: Recent Labs  Lab 03/13/24 0856 03/13/24 1242 03/13/24 1813 03/13/24 2059 03/14/24 0759  GLUCAP 104* 131* 138* 122* 100*    Recent Results (from the past 240 hours)  Resp panel by RT-PCR (RSV, Flu A&B, Covid) Peripheral     Status: Abnormal   Collection Time: 03/09/24  2:30 PM  Specimen: Peripheral; Nasal Swab  Result Value Ref Range Status   SARS Coronavirus 2 by RT PCR NEGATIVE NEGATIVE Final   Influenza A by PCR NEGATIVE NEGATIVE Final   Influenza B by PCR NEGATIVE NEGATIVE Final    Comment: (NOTE) The Xpert Xpress SARS-CoV-2/FLU/RSV plus assay is intended as an aid in the diagnosis of influenza from Nasopharyngeal swab specimens and should not be used as a sole basis for treatment. Nasal washings and aspirates are unacceptable for Xpert Xpress SARS-CoV-2/FLU/RSV testing.  Fact Sheet for Patients: bloggercourse.com  Fact Sheet for Healthcare Providers: seriousbroker.it  This test is not yet approved or cleared by the [place]  FDA and has been authorized for detection and/or diagnosis of SARS-CoV-2 by FDA under an Emergency Use Authorization (EUA). This EUA will remain in effect (meaning this test can be used) for the duration of the COVID-19 declaration under Section 564(b)(1) of the Act, 21 U.S.C. section 360bbb-3(b)(1), unless the authorization is terminated or revoked.     Resp Syncytial Virus by PCR POSITIVE (A) NEGATIVE Final    Comment: (NOTE) Fact Sheet for Patients: bloggercourse.com  Fact Sheet for Healthcare Providers: seriousbroker.it  This test is not yet approved or cleared by the [place]  FDA and has been authorized for detection  and/or diagnosis of SARS-CoV-2 by FDA under an Emergency Use Authorization (EUA). This EUA will remain in effect (meaning this test can be used) for the duration of the COVID-19 declaration under Section 564(b)(1) of the Act, 21 U.S.C. section 360bbb-3(b)(1), unless the authorization is terminated or revoked.  Performed at [facility], [address] [place], [place] [address]   Culture, blood (routine x 2)     Status: None   Collection Time: 03/09/24  2:55 PM   Specimen: BLOOD RIGHT HAND  Result Value Ref Range Status   Specimen Description BLOOD RIGHT HAND  Final   Special Requests   Final    BOTTLES DRAWN AEROBIC AND ANAEROBIC Blood Culture adequate volume   Culture   Final    NO GROWTH 5 DAYS Performed at [facility], [address], [place], [place] [address]    Report Status 03/14/2024 FINAL  Final  Culture, blood (routine x 2)     Status: None (Preliminary result)   Collection Time: 03/10/24 12:21 AM   Specimen: BLOOD LEFT HAND  Result Value Ref Range Status   Specimen Description BLOOD LEFT HAND  Final   Special Requests   Final    BOTTLES DRAWN AEROBIC AND ANAEROBIC Blood Culture adequate volume   Culture   Final    NO GROWTH 4 DAYS Performed at [facility], [address] [place], [place] [address]    Report Status PENDING  Incomplete      Radiology Studies: No results found.     LOS: 5 days   Risa Auman [employer] on [email or web address]  03/14/2024, 10:50 AM   "

## 2024-03-14 NOTE — Plan of Care (Signed)
" °  Problem: Education: Goal: Knowledge of General Education information will improve Description: Including pain rating scale, medication(s)/side effects and non-pharmacologic comfort measures Outcome: Progressing   Problem: Clinical Measurements: Goal: Ability to maintain clinical measurements within normal limits will improve Outcome: Progressing Goal: Will remain free from infection Outcome: Progressing Goal: Diagnostic test results will improve Outcome: Progressing Goal: Respiratory complications will improve Outcome: Progressing Goal: Cardiovascular complication will be avoided Outcome: Progressing   Problem: Skin Integrity: Goal: Risk for impaired skin integrity will decrease Outcome: Progressing   Problem: Activity: Goal: Ability to tolerate increased activity will improve Outcome: Progressing   "

## 2024-03-14 NOTE — Progress Notes (Signed)
 Explained discharge instructions to patient. Reviewed follow up appointment and next medication administration times. Also reviewed education. Patient verbalized having an understanding for instructions given. All belongings are in the patient's possession.  Will have TOC meds sent to the d/c lounge. IV and telemetry were removed. CCMD was notified. No other needs verbalized. Transporters will transport the patient downstairs to the discharge lounge.

## 2024-03-14 NOTE — Discharge Summary (Signed)
 " [facility]  Physician Discharge Summary   Patient ID: Christian [name] MRN: [id number] DOB/AGE: [birth year] 49 y.o.  Admit date: 03/09/2024 Discharge date: 03/14/2024    PCP: Tanda Bleacher, MD  DISCHARGE DIAGNOSES:    Acute on chronic diastolic CHF (congestive heart failure) (HCC)   Essential hypertension   Asthma, chronic, unspecified asthma severity, with acute exacerbation   Hypokalemia   Prediabetes   Depression   Obesity, class 3 (HCC)   Moderate persistent asthma with exacerbation Acute respiratory failure with hypoxia   RECOMMENDATIONS FOR OUTPATIENT FOLLOW UP: Follow-up with outpatient providers   Home Health: None Equipment/Devices: Home oxygen  CODE STATUS: Full code  DISCHARGE CONDITION: fair  Diet recommendation: Heart healthy  INITIAL HISTORY: 49 yo male with the past medical history of heart failure, OSA, obesity and depression who presented with dyspnea. On the day of admission he had severe symptoms, EMS was called and he was found in respiratory distress with low 02 saturation. He was placed on 4 L/min per [place] of supplemental 02 and was transported to the ED.  On his initial physical examination his blood pressure was 153/82, HR 103, RR 22 and 02 saturation 91%  Lungs with bilateral rales with diffuse rhonchi, heart with S1 and S2 present and regular with no gallops or rubs, abdomen with no distention, soft and non tender, positive lower extremity edema.    VBG 7,53/ 38.8/ 98/ 34/ 98%  Na 140, K 3.3 Cl 101, bicarbonate 28 glucose 176 bun 13 cr 0,97  BNP 872  High sensitive troponin 20 and 21  Lactic acid 1,7 and 1,8  Wbc 9,5 hgb 14.2 plt 270    RSV positive  Influenza negative Sars COVID 19 negative    CT chest with bilateral ground glass opacities,    Chest radiograph with hyperinflation, positive cardiomegaly, bilateral hilar vascular congestion, bilateral central interstitial infiltrates predominant at lower zones, positive  cephalization of the vasculature.    Patient was subsequently hospitalized for further management.    HOSPITAL COURSE:   Acute on chronic diastolic CHF (congestive heart failure) (HCC) Echocardiogram with preserved LV systolic function EF 60 to 65%, moderate asymmetric left ventricular hypertrophy, RV systolic function preserved, no significant valvular dysfunction.  Patient diuresed well.  Transitioned to torsemide . Patient also on losartan  and spironolactone  and Farxiga . Stable for the most part.  He has diuresed well.   Acute respiratory failure with hypoxia Noted to have oxygen requirements. Likely multifactorial including RSV, diastolic CHF, asthma. Home oxygen has been ordered.   Essential hypertension Continue with losartan  for blood pressure control Reasonably well-controlled.   Asthma, chronic, unspecified asthma severity, with acute exacerbation Continue inhalers.  Steroids changed to oral with quick taper.  Respiratory status stable for the most part.   RSV infection Multifocal opacities noted on CT chest.  Likely due to RSV.  Stable for the most part.  Supportive care.   Hypokalemia Continue to supplement as indicated.  Magnesium  is 2.5.   Class III obesity Estimated body mass index is 53.01 kg/m as calculated from the following:   Height as of this encounter: 6' 2 (1.88 m).   Weight as of this encounter: 187.3 kg.   Patient is stable.  Discussed with him and his sister today.  She plans to take him home with her for a few days.  Discussed with TOC.  Okay for discharge today.   PERTINENT LABS:  The results of significant diagnostics from this hospitalization (including imaging, microbiology, ancillary and laboratory)  are listed below for reference.    Microbiology: Recent Results (from the past 240 hours)  Resp panel by RT-PCR (RSV, Flu A&B, Covid) Peripheral     Status: Abnormal   Collection Time: 03/09/24  2:30 PM   Specimen: Peripheral; Nasal Swab   Result Value Ref Range Status   SARS Coronavirus 2 by RT PCR NEGATIVE NEGATIVE Final   Influenza A by PCR NEGATIVE NEGATIVE Final   Influenza B by PCR NEGATIVE NEGATIVE Final    Comment: (NOTE) The Xpert Xpress SARS-CoV-2/FLU/RSV plus assay is intended as an aid in the diagnosis of influenza from Nasopharyngeal swab specimens and should not be used as a sole basis for treatment. Nasal washings and aspirates are unacceptable for Xpert Xpress SARS-CoV-2/FLU/RSV testing.  Fact Sheet for Patients: bloggercourse.com  Fact Sheet for Healthcare Providers: seriousbroker.it  This test is not yet approved or cleared by the [place]  FDA and has been authorized for detection and/or diagnosis of SARS-CoV-2 by FDA under an Emergency Use Authorization (EUA). This EUA will remain in effect (meaning this test can be used) for the duration of the COVID-19 declaration under Section 564(b)(1) of the Act, 21 U.S.C. section 360bbb-3(b)(1), unless the authorization is terminated or revoked.     Resp Syncytial Virus by PCR POSITIVE (A) NEGATIVE Final    Comment: (NOTE) Fact Sheet for Patients: bloggercourse.com  Fact Sheet for Healthcare Providers: seriousbroker.it  This test is not yet approved or cleared by the [place]  FDA and has been authorized for detection and/or diagnosis of SARS-CoV-2 by FDA under an Emergency Use Authorization (EUA). This EUA will remain in effect (meaning this test can be used) for the duration of the COVID-19 declaration under Section 564(b)(1) of the Act, 21 U.S.C. section 360bbb-3(b)(1), unless the authorization is terminated or revoked.  Performed at [facility], [address], [place], [place] [address]   Culture, blood (routine x 2)     Status: None   Collection Time: 03/09/24  2:55 PM   Specimen: BLOOD RIGHT HAND  Result Value Ref Range  Status   Specimen Description BLOOD RIGHT HAND  Final   Special Requests   Final    BOTTLES DRAWN AEROBIC AND ANAEROBIC Blood Culture adequate volume   Culture   Final    NO GROWTH 5 DAYS Performed at [facility], [address], [place], [place] [address]    Report Status 03/14/2024 FINAL  Final  Culture, blood (routine x 2)     Status: None   Collection Time: 03/10/24 12:21 AM   Specimen: BLOOD LEFT HAND  Result Value Ref Range Status   Specimen Description BLOOD LEFT HAND  Final   Special Requests   Final    BOTTLES DRAWN AEROBIC AND ANAEROBIC Blood Culture adequate volume   Culture   Final    NO GROWTH 5 DAYS Performed at [facility], [address], [place], [place] [address]    Report Status 03/15/2024 FINAL  Final     Labs:   Basic Metabolic Panel: Recent Labs  Lab 03/10/24 0021 03/11/24 0434 03/12/24 0948 03/13/24 0521 03/14/24 0452 03/14/24 1110  NA 139 141 141 143 142  --   K 3.2* 2.9* 3.4* 3.4* 3.1* 4.2  CL 100 100 100 104 101  --   CO2 28 32 32 30 31  --   GLUCOSE 223* 156* 140* 104* 126*  --   BUN 15 19 20  23* 28*  --   CREATININE 1.08  0.95 1.00 0.98 1.23  --   CALCIUM  8.8* 8.5* 8.7* 9.1 8.6*  --   MG  --  2.3 2.2 2.5*  --   --    Liver Function Tests: Recent Labs  Lab 03/09/24 1455  AST 29  ALT 19  ALKPHOS 88  BILITOT 0.8  PROT 6.8  ALBUMIN 3.6    CBC: Recent Labs  Lab 03/09/24 1455 03/09/24 1508 03/09/24 1741 03/10/24 0021 03/11/24 0434  WBC 9.5  --  9.8 8.4 10.5  NEUTROABS 6.9  --   --   --   --   HGB 14.2 13.9 14.3 14.6 12.9*  HCT 41.3 41.0 42.9 42.7 38.6*  MCV 81.5  --  83.6 81.5 82.3  PLT 270  --  271 290 297    ProBNP (last 3 results) Recent Labs    07/28/23 1008 11/08/23 2127 03/09/24 1455  PROBNP 124* 147.0 872.0*    CBG: Recent Labs  Lab 03/13/24 1242 03/13/24 1813 03/13/24 2059 03/14/24 0759 03/14/24 1154  GLUCAP 131* 138* 122* 100* 142*     IMAGING STUDIES ECHOCARDIOGRAM  COMPLETE Result Date: 03/11/2024    ECHOCARDIOGRAM REPORT   Patient Name:   Christian [name] Date of Exam: 03/11/2024 Medical Rec #:  [id number]         Height:       74.0 in Accession #:    [id number]        Weight:       412.9 lb Date of Birth:  October 05, 1975         BSA:          2.958 m Patient Age:    49 years          BP:           136/82 mmHg Patient Gender: M                 HR:           85 bpm. Exam Location:  Inpatient Procedure: 2D Echo, Cardiac Doppler and Color Doppler (Both Spectral and Color            Flow Doppler were utilized during procedure). Indications:    Dyspnea R06.00  History:        Patient has prior history of Echocardiogram examinations, most                 recent 06/22/2022. CHF.  Sonographer:    Sydnee Wilson RDCS Referring Phys: [id number] MAURICIO DANIEL ARRIEN  Sonographer Comments: Suboptimal parasternal window. Image acquisition challenging due to patient body habitus. IMPRESSIONS  1. Left ventricular ejection fraction, by estimation, is 60 to 65%. The left ventricle has normal function. The left ventricle has no regional wall motion abnormalities. There is moderate asymmetric left ventricular hypertrophy of the basal-septal segment. Left ventricular diastolic parameters were normal.  2. Right ventricular systolic function is normal. The right ventricular size is normal.  3. The mitral valve is grossly normal. Trivial mitral valve regurgitation. No evidence of mitral stenosis.  4. The aortic valve was not well visualized. Aortic valve regurgitation is not visualized. No aortic stenosis is present.  5. The inferior vena cava is normal in size with greater than 50% respiratory variability, suggesting right atrial pressure of 3 mmHg. Comparison(s): No significant change from prior study. Conclusion(s)/Recommendation(s): Normal biventricular function without evidence of hemodynamically significant valvular heart disease. Technically challenging images with poor acoustic windows. FINDINGS   Left Ventricle: Left ventricular ejection fraction, by  estimation, is 60 to 65%. The left ventricle has normal function. The left ventricle has no regional wall motion abnormalities. The left ventricular internal cavity size was normal in size. There is  moderate asymmetric left ventricular hypertrophy of the basal-septal segment. Left ventricular diastolic parameters were normal. Right Ventricle: The right ventricular size is normal. Right vetricular wall thickness was not well visualized. Right ventricular systolic function is normal. Left Atrium: Left atrial size was normal in size. Right Atrium: Right atrial size was normal in size. Pericardium: There is no evidence of pericardial effusion. Presence of epicardial fat layer. Mitral Valve: The mitral valve is grossly normal. Trivial mitral valve regurgitation. No evidence of mitral valve stenosis. MV peak gradient, 8.1 mmHg. The mean mitral valve gradient is 4.0 mmHg. Tricuspid Valve: The tricuspid valve is grossly normal. Tricuspid valve regurgitation is trivial. No evidence of tricuspid stenosis. Aortic Valve: The aortic valve was not well visualized. Aortic valve regurgitation is not visualized. No aortic stenosis is present. Aortic valve mean gradient measures 6.0 mmHg. Aortic valve peak gradient measures 10.6 mmHg. Aortic valve area, by VTI measures 3.17 cm. Pulmonic Valve: The pulmonic valve was not well visualized. Pulmonic valve regurgitation is not visualized. No evidence of pulmonic stenosis. Aorta: The aortic root was not well visualized and the ascending aorta was not well visualized. Venous: The inferior vena cava is normal in size with greater than 50% respiratory variability, suggesting right atrial pressure of 3 mmHg. IAS/Shunts: The atrial septum is grossly normal.  LEFT VENTRICLE PLAX 2D LVIDd:         4.20 cm      Diastology LVIDs:         3.00 cm      LV e' medial:    9.03 cm/s LV PW:         1.20 cm      LV E/e' medial:  13.4 LV IVS:         1.70 cm      LV e' lateral:   10.40 cm/s LVOT diam:     2.00 cm      LV E/e' lateral: 11.6 LV SV:         102 LV SV Index:   34 LVOT Area:     3.14 cm  LV Volumes (MOD) LV vol d, MOD A2C: 151.0 ml LV vol d, MOD A4C: 184.0 ml LV vol s, MOD A2C: 57.2 ml LV vol s, MOD A4C: 64.7 ml LV SV MOD A2C:     93.8 ml LV SV MOD A4C:     184.0 ml LV SV MOD BP:      108.2 ml RIGHT VENTRICLE RV S prime:     18.50 cm/s TAPSE (M-mode): 2.3 cm LEFT ATRIUM             Index        RIGHT ATRIUM           Index LA diam:        4.00 cm 1.35 cm/m   RA Area:     14.20 cm LA Vol (A2C):   53.3 ml 18.02 ml/m  RA Volume:   31.00 ml  10.48 ml/m LA Vol (A4C):   57.1 ml 19.31 ml/m LA Biplane Vol: 55.5 ml 18.76 ml/m  AORTIC VALVE AV Area (Vmax):    2.99 cm AV Area (Vmean):   2.88 cm AV Area (VTI):     3.17 cm AV Vmax:  163.00 cm/s AV Vmean:          119.000 cm/s AV VTI:            0.321 m AV Peak Grad:      10.6 mmHg AV Mean Grad:      6.0 mmHg LVOT Vmax:         155.00 cm/s LVOT Vmean:        109.000 cm/s LVOT VTI:          0.324 m LVOT/AV VTI ratio: 1.01  AORTA Ao Root diam: 3.60 cm MITRAL VALVE MV Area (PHT): 2.54 cm     SHUNTS MV Area VTI:   2.43 cm     Systemic VTI:  0.32 m MV Peak grad:  8.1 mmHg     Systemic Diam: 2.00 cm MV Mean grad:  4.0 mmHg MV Vmax:       1.42 m/s MV Vmean:      94.5 cm/s MV Decel Time: 299 msec MV E velocity: 121.00 cm/s MV A velocity: 109.00 cm/s MV E/A ratio:  1.11 Shelda Bruckner MD Electronically signed by Shelda Bruckner MD Signature Date/Time: 03/11/2024/2:37:22 PM    Final    CT CHEST WO CONTRAST Result Date: 03/10/2024 EXAM: CT CHEST WITHOUT CONTRAST 03/10/2024 12:07:48 AM TECHNIQUE: CT of the chest was performed without the administration of intravenous contrast. Multiplanar reformatted images are provided for review. Automated exposure control, iterative reconstruction, and/or weight based adjustment of the mA/kV was utilized to reduce the radiation dose to as low as  reasonably achievable. COMPARISON: 04/26/2023. CLINICAL HISTORY: Shortness of breath (Ped 0-17y). FINDINGS: MEDIASTINUM: Heart and pericardium are unremarkable. The central airways are clear. LYMPH NODES: Small mediastinal nodes, including a 13 mm short axis subcarinal node with preservation of the normal fatty hilum. Small right perihilar node measuring up to 14 mm. These are favored to be reactive. LUNGS AND PLEURA: Faint nodularity in the bilateral lower lobes, including a dominant 12 mm subpleural nodule in the left lower lobe (image 120). Additional patchy opacity in the anterior left lower lobe (image 81) and patchy opacities in the central right middle lobe (image 79). This appearance suggests multifocal pneumonia. Platelike scarring in the anterior right upper lobe. No pleural effusion or pneumothorax. SOFT TISSUES/BONES: No acute abnormality of the bones or soft tissues. Degenerative changes of the visualized thoracolumbar spine UPPER ABDOMEN: Limited images of the upper abdomen demonstrates no acute abnormality. IMPRESSION: 1. Multifocal pneumonia in the right middle lobe and bilateral lower lobes. Electronically signed by: Pinkie Pebbles MD MD 03/10/2024 12:19 AM EST RP Workstation: [id number]   DG Chest Port 1 View Result Date: 03/09/2024 CLINICAL DATA:  Shortness of breath. EXAM: PORTABLE CHEST 1 VIEW COMPARISON:  11/08/2023, 07/08/2023. FINDINGS: The heart size and mediastinal contours are within normal limits. The pulmonary vasculature is distended. Interstitial prominence is present bilaterally. No consolidation, effusion, or pneumothorax is seen. No acute osseous abnormality. IMPRESSION: Pulmonary vascular congestion with interstitial prominence bilaterally, possible edema or infiltrate. Electronically Signed   By: Leita Birmingham M.D.   On: 03/09/2024 15:48    DISCHARGE EXAMINATION: See progress note from earlier today  DISPOSITION: Home with sister  Discharge Instructions     (HEART  FAILURE PATIENTS) Call MD:  Anytime you have any of the following symptoms: 1) 3 pound weight gain in 24 hours or 5 pounds in 1 week 2) shortness of breath, with or without a dry hacking cough 3) swelling in the hands, feet or stomach 4) if you have  to sleep on extra pillows at night in order to breathe.   Complete by: As directed    Call MD for:  difficulty breathing, headache or visual disturbances   Complete by: As directed    Call MD for:  extreme fatigue   Complete by: As directed    Call MD for:  persistant dizziness or light-headedness   Complete by: As directed    Call MD for:  persistant nausea and vomiting   Complete by: As directed    Call MD for:  severe uncontrolled pain   Complete by: As directed    Call MD for:  temperature >100.4   Complete by: As directed    Diet - low sodium heart healthy   Complete by: As directed    Discharge instructions   Complete by: As directed    Please take your medications as prescribed.  Use your oxygen around-the-clock for now and especially with exertion.  Eventually you may be able to come off of it.  Please be sure to follow-up with your PCP.  You were cared for by a hospitalist during your hospital stay. If you have any questions about your discharge medications or the care you received while you were in the hospital after you are discharged, you can call the unit and asked to speak with the hospitalist on call if the hospitalist that took care of you is not available. Once you are discharged, your primary care physician will handle any further medical issues. Please note that NO REFILLS for any discharge medications will be authorized once you are discharged, as it is imperative that you return to your primary care physician (or establish a relationship with a primary care physician if you do not have one) for your aftercare needs so that they can reassess your need for medications and monitor your lab values. If you do not have a primary care  physician, you can call [phone] for a physician referral.   Increase activity slowly   Complete by: As directed          Allergies as of 03/14/2024       Reactions   Shellfish Allergy Nausea Only   Tape Rash   Prefers paper tape        Medication List     STOP taking these medications    meloxicam  15 MG tablet Commonly known as: MOBIC    phentermine  37.5 MG tablet Commonly known as: ADIPEX-P        TAKE these medications    acetaminophen  500 MG tablet Commonly known as: TYLENOL  Take 1 tablet (500 mg total) by mouth every 6 (six) hours as needed. What changed:  how much to take reasons to take this   Advair  HFA 230-21 MCG/ACT inhaler Generic drug: fluticasone -salmeterol Inhale 2 puffs into the lungs 2 (two) times daily.   albuterol  108 (90 Base) MCG/ACT inhaler Commonly known as: VENTOLIN  HFA Inhale 1-2 puffs into the lungs every 6 (six) hours as needed for wheezing or shortness of breath.   busPIRone  7.5 MG tablet Commonly known as: BUSPAR  Take 1 tablet (7.5 mg total) by mouth 2 (two) times daily.   cetirizine 10 MG tablet Commonly known as: ZYRTEC Take 10 mg by mouth daily.   dapagliflozin  propanediol 10 MG Tabs tablet Commonly known as: FARXIGA  Take 1 tablet (10 mg total) by mouth daily.   fluticasone  50 MCG/ACT nasal spray Commonly known as: FLONASE  Spray 2 sprays into both nostrils once daily.   losartan  25 MG tablet  Commonly known as: COZAAR  Take 1 tablet (25 mg total) by mouth daily.   MULTIVITAMIN PO Take 1 tablet by mouth daily.   potassium chloride  SA 20 MEQ tablet Commonly known as: KLOR-CON  M Take 1 tablet (20 mEq total) by mouth daily.   predniSONE  20 MG tablet Commonly known as: DELTASONE  Take 2 tablets (40 mg total) by mouth daily for 3 days, THEN 1 tablet (20 mg total) daily for 3 days. Then stop. Start taking on: March 14, 2024   sertraline  100 MG tablet Commonly known as: ZOLOFT  Take 2 tablets (200 mg total) by mouth  at bedtime.   spironolactone  25 MG tablet Commonly known as: ALDACTONE  Take 1 tablet (25 mg total) by mouth daily.   torsemide  20 MG tablet Commonly known as: DEMADEX  Take 3 tablets (60 mg total) by mouth daily. What changed:  how much to take when to take this   traZODone  50 MG tablet Commonly known as: DESYREL  Take 1 tablet (50 mg total) by mouth at bedtime.          Follow-up Information     Tanda Bleacher, MD. Schedule an appointment as soon as possible for a visit in 1 week(s).   Specialty: Family Medicine Why: post hospitalization follow up Contact information: [address] [employer] [address] [place] [address] [phone]                 TOTAL DISCHARGE TIME: 35 minutes   Bena Kobel Verdene  [facility] Pager on [email or web address]  03/15/2024, 12:04 PM    "

## 2024-03-14 NOTE — TOC Progression Note (Addendum)
 Transition of Care [facility]) - Progression Note    Patient Details  Name: Christian [name] MRN: [id number] Date of Birth: [birth year]  [facility] CM/SW Contact  [name], LCSWA Phone Number: 03/14/2024, 10:54 AM  Clinical Narrative:     CSW spoke with patient and with patients permission his sister Ellouise at bedside. Patient and Ellouise informed CSW that plan is for patient to dc to his sisters home. Patients sister informed CSW that she plans on picking up patients belongings later today from [facility]. Patient inquired about meals on wheels. CSW attached contact information for meals on wheels on patients AVS. CSW informed patient. Patient reports he will transport by uber when ready for dc. All questions answered. No further questions reported at this time. CSW informed MD.  Expected Discharge Plan:  (TBD) Barriers to Discharge: Continued Medical Work up               Expected Discharge Plan and Services In-house Referral: Clinical Social Work     Living arrangements for the past 2 months: Homeless Expected Discharge Date: 03/14/24                                     Social Drivers of Health (SDOH) Interventions SDOH Screenings   Food Insecurity: Food Insecurity Present (03/09/2024)  Housing: High Risk (03/09/2024)  Transportation Needs: Unmet Transportation Needs (03/09/2024)  Utilities: At Risk (03/09/2024)  Alcohol Screen: Low Risk (12/09/2023)  Depression (PHQ2-9): Medium Risk (01/19/2024)  Financial Resource Strain: High Risk (12/09/2023)  Physical Activity: Insufficiently Active (12/09/2023)  Social Connections: Moderately Integrated (12/09/2023)  Stress: Stress Concern Present (12/09/2023)  Tobacco Use: Medium Risk (03/10/2024)  Health Literacy: Adequate Health Literacy (11/17/2022)    Readmission Risk Interventions    11/22/2022   11:09 AM 06/16/2022    1:32 PM  Readmission Risk Prevention Plan  Transportation Screening  Complete Complete  PCP or Specialist Appt within 5-7 Days Complete Complete  Home Care Screening Complete Complete  Medication Review (RN CM) Complete Complete

## 2024-03-14 NOTE — TOC Transition Note (Signed)
 Transition of Care [facility]) - Discharge Note   Patient Details  Name: Lowen Barringer MRN: [id number] Date of Birth: [birth year]  Transition of Care [facility]) CM/SW Contact:  Sudie Erminio Deems, RN Phone Number: 03/14/2024, 11:20 AM   Clinical Narrative:  Patient plans to discharge today. Patient states he will discharge to his sisters home off [facility] [facility]. Patient uses [employer] for DME and he has qualified for home oxygen. [employer] is aware to deliver a portable tank to the room. Sister states she will pick up CPAP from the [facility] at 5:00 pm. No further home needs identified at this time.    Final next level of care: Home/Self Care Barriers to Discharge: No Barriers Identified  Patient Goals and CMS Choice Patient states their goals for this hospitalization and ongoing recovery are:: plans to return home with sister.  Discharge Plan and Services Additional resources added to the After Visit Summary for   In-house Referral: Clinical Social Work              DME Arranged: Oxygen DME Agency: [employer] Date DME Agency Contacted: 03/14/24 Time DME Agency Contacted: 1119 Representative spoke with at DME Agency: [employer] [facility] Social Drivers of Health (SDOH) Interventions SDOH Screenings   Food Insecurity: Food Insecurity Present (03/09/2024)  Housing: High Risk (03/09/2024)  Transportation Needs: Unmet Transportation Needs (03/09/2024)  Utilities: At Risk (03/09/2024)  Alcohol Screen: Low Risk (12/09/2023)  Depression (PHQ2-9): Medium Risk (01/19/2024)  Financial Resource Strain: High Risk (12/09/2023)  Physical Activity: Insufficiently Active (12/09/2023)  Social Connections: Moderately Integrated (12/09/2023)  Stress: Stress Concern Present (12/09/2023)  Tobacco Use: Medium Risk (03/10/2024)  Health Literacy: Adequate Health Literacy (11/17/2022)   Readmission Risk Interventions    11/22/2022   11:09 AM 06/16/2022    1:32 PM  Readmission Risk Prevention Plan   Transportation Screening Complete Complete  PCP or Specialist Appt within 5-7 Days Complete Complete  Home Care Screening Complete Complete  Medication Review (RN CM) Complete Complete

## 2024-03-14 NOTE — Progress Notes (Addendum)
SATURATION QUALIFICATIONS: (This note is used to comply with regulatory documentation for home oxygen)  Patient Saturations on Room Air at Rest = 88%  Patient Saturations on Room Air while Ambulating = 84%  Patient Saturations on 2 Liters of oxygen while Ambulating = 92%  Please briefly explain why patient needs home oxygen: 

## 2024-03-14 NOTE — Plan of Care (Signed)
" °  Problem: Education: Goal: Knowledge of General Education information will improve Description: Including pain rating scale, medication(s)/side effects and non-pharmacologic comfort measures Outcome: Adequate for Discharge   Problem: Health Behavior/Discharge Planning: Goal: Ability to manage health-related needs will improve Outcome: Adequate for Discharge   Problem: Clinical Measurements: Goal: Ability to maintain clinical measurements within normal limits will improve Outcome: Adequate for Discharge Goal: Will remain free from infection Outcome: Adequate for Discharge Goal: Diagnostic test results will improve Outcome: Adequate for Discharge Goal: Respiratory complications will improve Outcome: Adequate for Discharge Goal: Cardiovascular complication will be avoided Outcome: Adequate for Discharge   Problem: Nutrition: Goal: Adequate nutrition will be maintained Outcome: Adequate for Discharge   Problem: Coping: Goal: Level of anxiety will decrease Outcome: Adequate for Discharge   Problem: Elimination: Goal: Will not experience complications related to bowel motility Outcome: Adequate for Discharge Goal: Will not experience complications related to urinary retention Outcome: Adequate for Discharge   Problem: Pain Managment: Goal: General experience of comfort will improve and/or be controlled Outcome: Adequate for Discharge   Problem: Safety: Goal: Ability to remain free from injury will improve Outcome: Adequate for Discharge   Problem: Skin Integrity: Goal: Risk for impaired skin integrity will decrease Outcome: Adequate for Discharge   Problem: Activity: Goal: Ability to tolerate increased activity will improve Outcome: Adequate for Discharge   Problem: Clinical Measurements: Goal: Ability to maintain a body temperature in the normal range will improve Outcome: Adequate for Discharge   Problem: Respiratory: Goal: Ability to maintain adequate ventilation  will improve Outcome: Adequate for Discharge Goal: Ability to maintain a clear airway will improve Outcome: Adequate for Discharge   Problem: Acute Rehab PT Goals(only PT should resolve) Goal: Pt Will Go Supine/Side To Sit Outcome: Adequate for Discharge Goal: Patient Will Transfer Sit To/From Stand Outcome: Adequate for Discharge Goal: Pt Will Transfer Bed To Chair/Chair To Bed Outcome: Adequate for Discharge Goal: Pt Will Ambulate Outcome: Adequate for Discharge Goal: Pt Will Go Up/Down Stairs Outcome: Adequate for Discharge   Problem: Education: Goal: Ability to describe self-care measures that may prevent or decrease complications (Diabetes Survival Skills Education) will improve Outcome: Adequate for Discharge Goal: Individualized Educational Video(s) Outcome: Adequate for Discharge   Problem: Coping: Goal: Ability to adjust to condition or change in health will improve Outcome: Adequate for Discharge   Problem: Nutritional: Goal: Maintenance of adequate nutrition will improve Outcome: Adequate for Discharge Goal: Progress toward achieving an optimal weight will improve Outcome: Adequate for Discharge   Problem: Skin Integrity: Goal: Risk for impaired skin integrity will decrease Outcome: Adequate for Discharge   Problem: Tissue Perfusion: Goal: Adequacy of tissue perfusion will improve Outcome: Adequate for Discharge   Problem: Acute Rehab OT Goals (only OT should resolve) Goal: Pt. Will Perform Grooming Outcome: Adequate for Discharge Goal: Pt. Will Perform Lower Body Dressing Outcome: Adequate for Discharge Goal: Pt. Will Transfer To Toilet Outcome: Adequate for Discharge Goal: Pt. Will Perform Toileting-Clothing Manipulation Outcome: Adequate for Discharge Goal: OT Additional ADL Goal #1 Outcome: Adequate for Discharge   "

## 2024-03-14 NOTE — Discharge Instructions (Signed)
 Publishing Rights Manager  [facility])  Address: [address] [place], [place] [address] Open  Closes 5?PM Phone: [phone]

## 2024-03-14 NOTE — TOC CM/SW Note (Signed)
" °  °  Durable Medical Equipment  (From admission, onward)           Start     Ordered   03/13/24 1306  For home use only DME oxygen  Once       Question Answer Comment  Length of Need 6 Months   Mode or (Route) Nasal cannula   Liters per Minute 2   Frequency Continuous (stationary and portable oxygen unit needed)   Oxygen conserving device Yes   Oxygen delivery system: Gas   Oxygen delivery system: Concentrator   Oxygen delivery system: Portable concentrator (POC)      03/13/24 1306            "

## 2024-03-15 ENCOUNTER — Telehealth: Payer: Self-pay | Admitting: *Deleted

## 2024-03-15 LAB — CULTURE, BLOOD (ROUTINE X 2)
Culture: NO GROWTH
Special Requests: ADEQUATE

## 2024-03-15 NOTE — Transitions of Care (Post Inpatient/ED Visit) (Signed)
" ° °  03/15/2024  Name: Devyon Keator MRN: [id number] DOB: 1976-02-26  Today's TOC FU Call Status: Today's TOC FU Call Status:: Unsuccessful Call (1st Attempt) Unsuccessful Call (1st Attempt) Date: 03/15/24  Attempted to reach the patient regarding the most recent Inpatient/ED visit.  Follow Up Plan: Additional outreach attempts will be made to reach the patient to complete the Transitions of Care (Post Inpatient/ED visit) call.   Andrea Dimes RN, BSN [place]  [facility] RN Care Manager [phone]  "

## 2024-03-18 ENCOUNTER — Telehealth: Payer: Self-pay | Admitting: *Deleted

## 2024-03-18 NOTE — Transitions of Care (Post Inpatient/ED Visit) (Signed)
" ° °  03/18/2024  Name: Christian [name] MRN: [id number] DOB: 11-18-1975  Today's TOC FU Call Status: Today's TOC FU Call Status:: Unsuccessful Call (2nd Attempt) Unsuccessful Call (2nd Attempt) Date: 03/18/24  Attempted to reach the patient regarding the most recent Inpatient/ED visit.  Follow Up Plan: Additional outreach attempts will be made to reach the patient to complete the Transitions of Care (Post Inpatient/ED visit) call.   Andrea Dimes RN, BSN [place]  [facility] RN Care Manager [phone]  "

## 2024-03-19 ENCOUNTER — Telehealth: Payer: Self-pay | Admitting: *Deleted

## 2024-03-19 NOTE — Transitions of Care (Post Inpatient/ED Visit) (Signed)
" ° °  03/19/2024  Name: Emir Nack MRN: [id number] DOB: [birth year]  Today's TOC FU Call Status: Today's TOC FU Call Status:: Unsuccessful Call (3rd Attempt) Unsuccessful Call (3rd Attempt) Date: 03/19/24  Attempted to reach the patient regarding the most recent Inpatient/ED visit.  Follow Up Plan: No further outreach attempts will be made at this time. We have been unable to contact the patient.  Andrea Dimes RN, BSN [place]  [facility] RN Care Manager [phone]  "

## 2024-03-20 ENCOUNTER — Other Ambulatory Visit: Payer: Self-pay | Admitting: Family Medicine

## 2024-03-20 ENCOUNTER — Other Ambulatory Visit (HOSPITAL_COMMUNITY): Payer: Self-pay | Admitting: Physician Assistant

## 2024-03-20 ENCOUNTER — Other Ambulatory Visit: Payer: Self-pay

## 2024-03-20 DIAGNOSIS — F331 Major depressive disorder, recurrent, moderate: Secondary | ICD-10-CM

## 2024-03-20 MED ORDER — FLUTICASONE PROPIONATE 50 MCG/ACT NA SUSP
2.0000 | Freq: Every day | NASAL | 2 refills | Status: AC
  Filled 2024-03-20: qty 16, 30d supply, fill #0

## 2024-03-21 ENCOUNTER — Telehealth: Payer: Self-pay | Admitting: Family Medicine

## 2024-03-21 ENCOUNTER — Other Ambulatory Visit: Payer: Self-pay

## 2024-03-21 NOTE — Telephone Encounter (Signed)
 A document form from [employer] has been faxed: request for prior approval: adult size pull-ons, to be filled out and signed by provider. Send document back via Fax within 7-days to 14days. Document is located in providers tray at front office.          Fax number: [phone]

## 2024-03-23 ENCOUNTER — Other Ambulatory Visit (HOSPITAL_COMMUNITY): Payer: Self-pay

## 2024-03-23 MED ORDER — BUSPIRONE HCL 7.5 MG PO TABS: 7.5000 mg | ORAL_TABLET | Freq: Two times a day (BID) | ORAL | 0 refills | Status: AC

## 2024-03-24 ENCOUNTER — Other Ambulatory Visit (HOSPITAL_COMMUNITY): Payer: Self-pay

## 2024-03-25 ENCOUNTER — Telehealth: Payer: Self-pay | Admitting: *Deleted

## 2024-03-25 NOTE — Telephone Encounter (Signed)
 Copied from CRM #[id number]. Topic: Clinical - Order For Equipment >> Mar 25, 2024  1:17 PM Victoria B wrote: Reason for CRM: Patient is living in boarding home and needs a bedside commode

## 2024-03-26 ENCOUNTER — Telehealth: Payer: Self-pay | Admitting: Family Medicine

## 2024-03-26 ENCOUNTER — Other Ambulatory Visit: Payer: Self-pay | Admitting: Family Medicine

## 2024-03-26 DIAGNOSIS — Z7409 Other reduced mobility: Secondary | ICD-10-CM

## 2024-03-26 NOTE — Telephone Encounter (Signed)
 A document form has been faxed: additional information needed on prior approval form, to be filled out by provider. Send document back via Fax within 7-days. Document is located in providers tray at front office.          Fax number: [phone]

## 2024-03-26 NOTE — Telephone Encounter (Signed)
 Tried to place order in [place] - got an error with pt's insurance eligibility.  Please confirm we have the correct insurance info

## 2024-03-27 ENCOUNTER — Telehealth: Payer: Self-pay

## 2024-03-27 NOTE — Telephone Encounter (Signed)
 I called Bobbette Sero Complex Case RN, to ask where signed ordered for bed side commode needed to be sent.SABRA   FYI to Dr. Tanda for pt's OV 04/03/24: Felicia requested Dr. Tanda to consider a referral for [facility] services and a referral for home health OT. If Dr. Tanda agrees, we can send these referrals to [facility] for processing.

## 2024-03-29 NOTE — Telephone Encounter (Signed)
 Sent via parachute. Felicia made aware.

## 2024-03-29 NOTE — Telephone Encounter (Signed)
 Copied from CRM [id number]. Topic: Clinical - Order For Equipment >> Mar 29, 2024  9:32 AM Willma SAUNDERS wrote: Reason for CRM: Felicia from [facility] (complex transitional care nurse) calling to see if the order for the bedside commode can be sent to [facility] via [employer].  Felicia can be reached at [phone]

## 2024-04-01 ENCOUNTER — Encounter (HOSPITAL_COMMUNITY): Payer: Self-pay

## 2024-04-01 ENCOUNTER — Other Ambulatory Visit: Payer: Self-pay

## 2024-04-01 ENCOUNTER — Emergency Department (HOSPITAL_COMMUNITY): Payer: MEDICAID

## 2024-04-01 ENCOUNTER — Emergency Department (HOSPITAL_COMMUNITY)
Admission: EM | Admit: 2024-04-01 | Discharge: 2024-04-02 | Disposition: A | Payer: MEDICAID | Attending: Emergency Medicine | Admitting: Emergency Medicine

## 2024-04-01 DIAGNOSIS — J4541 Moderate persistent asthma with (acute) exacerbation: Secondary | ICD-10-CM | POA: Insufficient documentation

## 2024-04-01 DIAGNOSIS — I5033 Acute on chronic diastolic (congestive) heart failure: Secondary | ICD-10-CM | POA: Insufficient documentation

## 2024-04-01 DIAGNOSIS — Z79899 Other long term (current) drug therapy: Secondary | ICD-10-CM | POA: Insufficient documentation

## 2024-04-01 DIAGNOSIS — I110 Hypertensive heart disease with heart failure: Secondary | ICD-10-CM | POA: Insufficient documentation

## 2024-04-01 DIAGNOSIS — D72829 Elevated white blood cell count, unspecified: Secondary | ICD-10-CM | POA: Insufficient documentation

## 2024-04-01 DIAGNOSIS — R5383 Other fatigue: Secondary | ICD-10-CM | POA: Insufficient documentation

## 2024-04-01 DIAGNOSIS — R0602 Shortness of breath: Secondary | ICD-10-CM | POA: Insufficient documentation

## 2024-04-01 DIAGNOSIS — R0609 Other forms of dyspnea: Secondary | ICD-10-CM | POA: Insufficient documentation

## 2024-04-01 DIAGNOSIS — Z7951 Long term (current) use of inhaled steroids: Secondary | ICD-10-CM | POA: Insufficient documentation

## 2024-04-01 DIAGNOSIS — Z8616 Personal history of COVID-19: Secondary | ICD-10-CM | POA: Insufficient documentation

## 2024-04-01 LAB — BASIC METABOLIC PANEL WITH GFR
Anion gap: 15 (ref 5–15)
BUN: 19 mg/dL (ref 6–20)
CO2: 28 mmol/L (ref 22–32)
Calcium: 9.6 mg/dL (ref 8.9–10.3)
Chloride: 95 mmol/L — ABNORMAL LOW (ref 98–111)
Creatinine, Ser: 1.29 mg/dL — ABNORMAL HIGH (ref 0.61–1.24)
GFR, Estimated: >60 mL/min
Glucose, Bld: 132 mg/dL — ABNORMAL HIGH (ref 70–99)
Potassium: 3.7 mmol/L (ref 3.5–5.1)
Sodium: 138 mmol/L (ref 135–145)

## 2024-04-01 LAB — CBC
HCT: 48 % (ref 39.0–52.0)
Hemoglobin: 15.6 g/dL (ref 13.0–17.0)
MCH: 27.5 pg (ref 26.0–34.0)
MCHC: 32.5 g/dL (ref 30.0–36.0)
MCV: 84.5 fL (ref 80.0–100.0)
Platelets: 333 10*3/uL (ref 150–400)
RBC: 5.68 MIL/uL (ref 4.22–5.81)
RDW: 13.2 % (ref 11.5–15.5)
WBC: 14.2 10*3/uL — ABNORMAL HIGH (ref 4.0–10.5)
nRBC: 0 % (ref 0.0–0.2)

## 2024-04-01 LAB — PRO BRAIN NATRIURETIC PEPTIDE: Pro Brain Natriuretic Peptide: 66.9 pg/mL

## 2024-04-01 LAB — D-DIMER, QUANTITATIVE: D-Dimer, Quant: 0.36 ug{FEU}/mL (ref 0.00–0.50)

## 2024-04-01 MED ORDER — ONDANSETRON 4 MG PO TBDP: 4.0000 mg | ORAL_TABLET | Freq: Once | ORAL | Status: DC

## 2024-04-01 NOTE — ED Provider Triage Note (Signed)
 Emergency Medicine Provider Triage Evaluation Note  Christian [name] , a 49 y.o. male  was evaluated in triage.  Pt complains of shortness of breath and dyspnea on exertion.  Was recently admitted for RSV and reports worsening dyspnea on exertion over the past few weeks.  Denies leg swelling.  Was hospitalized for 1 week with RSV.  Denies history of DVT or pulmonary embolism, recent surgeries, recent travel.  Review of Systems  Positive: See above Negative: Hemoptysis  Physical Exam  BP 131/82   Pulse 95   Temp 98.5 F (36.9 C) (Oral)   Resp 16   Ht 6' 2 (1.88 m)   Wt (!) 187.3 kg   SpO2 98%   BMI 53.02 kg/m  Gen:   Awake, no distress   Resp:  Normal effort  MSK:   Moves extremities without difficulty    Medical Decision Making  Medically screening exam initiated at 9:10 PM.  Appropriate orders placed.  Christian [name] was informed that the remainder of the evaluation will be completed by another provider, this initial triage assessment does not replace that evaluation, and the importance of remaining in the ED until their evaluation is complete.     Ula Prentice SAUNDERS, MD 04/01/24 2111

## 2024-04-02 ENCOUNTER — Telehealth: Payer: Self-pay | Admitting: Family Medicine

## 2024-04-02 NOTE — ED Notes (Signed)
 Patient provided with water per oral hydration order at this time.

## 2024-04-02 NOTE — Telephone Encounter (Signed)
 Copied from CRM #[id number]. Topic: General - Other >> Apr 02, 2024  8:31 AM Rosaria BRAVO wrote: Reason for CRM: Kim from [facility] called to check the status of the return fax from the clinic. Initially faxed 01/21 but was missing:  2nd page was not signed or dated Missing pt's height and weight  Best contact: [phone]

## 2024-04-02 NOTE — ED Notes (Signed)
 Pulse ox while ambulating with and without oxygen:  Sitting prior to ambulation: 97% RA with HR 97 Ambulating on RA: 96% RA with HR 105 Ambulating with 2L [place]: 100% 2L [place] with HR 111.

## 2024-04-03 ENCOUNTER — Ambulatory Visit: Payer: MEDICAID | Admitting: Family Medicine

## 2024-04-03 ENCOUNTER — Encounter: Payer: Self-pay | Admitting: Family Medicine

## 2024-04-03 VITALS — BP 108/72 | HR 101 | Ht 74.0 in | Wt >= 6400 oz

## 2024-04-03 DIAGNOSIS — M25561 Pain in right knee: Secondary | ICD-10-CM

## 2024-04-03 DIAGNOSIS — Z7409 Other reduced mobility: Secondary | ICD-10-CM

## 2024-04-03 DIAGNOSIS — E66813 Obesity, class 3: Secondary | ICD-10-CM

## 2024-04-03 DIAGNOSIS — I10 Essential (primary) hypertension: Secondary | ICD-10-CM

## 2024-04-03 NOTE — Telephone Encounter (Signed)
 Done

## 2024-04-03 NOTE — Progress Notes (Unsigned)
 "  Established Patient Office Visit  Subjective    Patient ID: Christian [name], male    DOB: 1975-10-28  Age: 49 y.o. MRN: [id number]  CC:  Chief Complaint  Patient presents with   Hospitalization Follow-up    Pt reports he was given oxygen at discharge but has not been using it    HPI Christian [name] presents for follow up of hypertension. Patient also reports that he has gotten out of the shelter. Patient reports that he does get winded with activity. He was given oxygen at discharge but has not been using it.   Outpatient Encounter Medications as of 04/03/2024  Medication Sig   albuterol  (VENTOLIN  HFA) 108 (90 Base) MCG/ACT inhaler Inhale 1-2 puffs into the lungs every 6 (six) hours as needed for wheezing or shortness of breath.   busPIRone  (BUSPAR ) 7.5 MG tablet Take 1 tablet (7.5 mg total) by mouth 2 (two) times daily.   dapagliflozin  propanediol (FARXIGA ) 10 MG TABS tablet Take 1 tablet (10 mg total) by mouth daily.   fluticasone -salmeterol (ADVAIR  HFA) 230-21 MCG/ACT inhaler Inhale 2 puffs into the lungs 2 (two) times daily.   losartan  (COZAAR ) 25 MG tablet Take 1 tablet (25 mg total) by mouth daily.   potassium chloride  SA (KLOR-CON  M) 20 MEQ tablet Take 1 tablet (20 mEq total) by mouth daily.   sertraline  (ZOLOFT ) 100 MG tablet Take 2 tablets (200 mg total) by mouth at bedtime.   spironolactone  (ALDACTONE ) 25 MG tablet Take 1 tablet (25 mg total) by mouth daily.   torsemide  (DEMADEX ) 20 MG tablet Take 3 tablets (60 mg total) by mouth daily.   traZODone  (DESYREL ) 50 MG tablet Take 1 tablet (50 mg total) by mouth at bedtime.   acetaminophen  (TYLENOL ) 500 MG tablet Take 1 tablet (500 mg total) by mouth every 6 (six) hours as needed. (Patient taking differently: Take 1,000 mg by mouth every 6 (six) hours as needed for moderate pain (pain score 4-6) or mild pain (pain score 1-3).)   cetirizine (ZYRTEC) 10 MG tablet Take 10 mg by mouth daily.   fluticasone  (FLONASE ) 50 MCG/ACT nasal  spray Spray 2 sprays into both nostrils once daily.   Multiple Vitamin (MULTIVITAMIN PO) Take 1 tablet by mouth daily.   Facility-Administered Encounter Medications as of 04/03/2024  Medication   cholecalciferol (VITAMIN D3) tablet 1,000 Units    Past Medical History:  Diagnosis Date   Allergic rhinitis    Anxiety    CHF (congestive heart failure) (HCC)    Depression    Depression    Elevated blood pressure reading without diagnosis of hypertension    History of chicken pox    Obesity    Perforated bowel (HCC)    Pleural effusion    Sepsis (HCC)    Sleep apnea    O2 at night    Past Surgical History:  Procedure Laterality Date   COLON SURGERY     perforated bowel   TONSILLECTOMY AND ADENOIDECTOMY  03/01/1983    Family History  Problem Relation Age of Onset   Stroke Mother    Diabetes Mother        pre-diabetes   Heart disease Mother        CHF, pacer   Cancer Mother        lymphoma   Stomach cancer Mother    Clotting disorder Mother    Coronary artery disease Father 58       MI   Hypertension Father  Hyperlipidemia Father    Diabetes Father    Heart attack Father    Migraines Sister    Stroke Paternal Grandmother    Diabetes Paternal Grandmother    Heart disease Paternal Grandfather    Parkinson's disease Paternal Grandfather    Heart attack Paternal Grandfather     Social History   Socioeconomic History   Marital status: Single    Spouse name: Not on file   Number of children: 0   Years of education: Not on file   Highest education level: Some college, no degree  Occupational History   Not on file  Tobacco Use   Smoking status: Never    Passive exposure: Current   Smokeless tobacco: Never   Tobacco comments:    Living in homeless shelter  Vaping Use   Vaping status: Never Used  Substance and Sexual Activity   Alcohol use: Not Currently    Comment: occasionally   Drug use: Never   Sexual activity: Not on file  Other Topics Concern   Not  on file  Social History Narrative   Caffeine: occasional, lives at a shelter ([facility])  Occupation: works at [employer] part timeEdu: some college Diet: some water, some fruits/vegetables, 1-2x/wk red meat, fish 1x/wk   Social Drivers of Health   Tobacco Use: Medium Risk (04/03/2024)   Patient History    Smoking Tobacco Use: Never    Smokeless Tobacco Use: Never    Passive Exposure: Current  Financial Resource Strain: High Risk (12/09/2023)   Overall Financial Resource Strain (CARDIA)    Difficulty of Paying Living Expenses: Very hard  Food Insecurity: Food Insecurity Present (03/09/2024)   Epic    Worried About Programme Researcher, Broadcasting/film/video in the Last Year: Sometimes true    Ran Out of Food in the Last Year: Sometimes true  Transportation Needs: Unmet Transportation Needs (03/09/2024)   Epic    Lack of Transportation (Medical): Yes    Lack of Transportation (Non-Medical): Yes  Physical Activity: Insufficiently Active (12/09/2023)   Exercise Vital Sign    Days of Exercise per Week: 2 days    Minutes of Exercise per Session: 20 min  Stress: Stress Concern Present (12/09/2023)   [employer] of Occupational Health - Occupational Stress Questionnaire    Feeling of Stress: Rather much  Social Connections: Moderately Integrated (12/09/2023)   Social Connection and Isolation Panel    Frequency of Communication with Friends and Family: Three times a week    Frequency of Social Gatherings with Friends and Family: Twice a week    Attends Religious Services: More than 4 times per year    Active Member of Clubs or Organizations: Yes    Attends Banker Meetings: More than 4 times per year    Marital Status: Never married  Intimate Partner Violence: Not At Risk (03/09/2024)   Epic    Fear of Current or Ex-Partner: No    Emotionally Abused: No    Physically Abused: No    Sexually Abused: No  Depression (PHQ2-9): Medium Risk (01/19/2024)   Depression (PHQ2-9)    PHQ-2 Score:  7  Alcohol Screen: Low Risk (12/09/2023)   Alcohol Screen    Last Alcohol Screening Score (AUDIT): 2  Housing: High Risk (03/09/2024)   Epic    Unable to Pay for Housing in the Last Year: Yes    Number of Times Moved in the Last Year: 1    Homeless in the Last Year: Yes  Utilities: At  Risk (03/09/2024)   Epic    Threatened with loss of utilities: Yes  Health Literacy: Adequate Health Literacy (11/17/2022)   B1300 Health Literacy    Frequency of need for help with medical instructions: Rarely    Review of Systems  All other systems reviewed and are negative.       Objective    BP 108/72   Pulse (!) 101   Ht 6' 2 (1.88 m)   Wt (!) 403 lb 3.2 oz (182.9 kg)   SpO2 95%   BMI 51.77 kg/m   Physical Exam Vitals and nursing note reviewed.  Constitutional:      General: He is not in acute distress.    Appearance: He is obese.  Cardiovascular:     Rate and Rhythm: Normal rate and regular rhythm.  Pulmonary:     Effort: Pulmonary effort is normal.     Breath sounds: Normal breath sounds.  Abdominal:     Palpations: Abdomen is soft.     Tenderness: There is no abdominal tenderness.  Neurological:     General: No focal deficit present.     Mental Status: He is alert and oriented to person, place, and time.  Psychiatric:        Mood and Affect: Mood and affect normal.        Speech: Speech normal.        Behavior: Behavior normal. Behavior is cooperative.     {Labs (Optional):23779}    Assessment & Plan:   Essential hypertension  Class 3 severe obesity due to excess calories with serious comorbidity and body mass index (BMI) of 50.0 to 59.9 in adult [facility])  Impaired mobility  Pain in both knees, unspecified chronicity     Return in about 3 months (around 07/01/2024) for follow up, chronic med issues.   Tanda Raguel SQUIBB, MD  "

## 2024-04-11 ENCOUNTER — Ambulatory Visit: Payer: MEDICAID | Admitting: Family Medicine

## 2024-04-16 ENCOUNTER — Ambulatory Visit: Payer: MEDICAID | Admitting: Family Medicine

## 2024-04-18 ENCOUNTER — Ambulatory Visit: Payer: MEDICAID | Admitting: Family Medicine

## 2024-07-01 ENCOUNTER — Ambulatory Visit: Payer: Self-pay | Admitting: Family Medicine
